# Patient Record
Sex: Male | Born: 1940 | Race: White | Hispanic: No | Marital: Married | Smoking: Former smoker
Health system: Southern US, Community
[De-identification: ages and names within clinical notes are randomized; demographics above are authoritative.]

## PROBLEM LIST (undated history)

## (undated) DIAGNOSIS — J449 Chronic obstructive pulmonary disease, unspecified: Secondary | ICD-10-CM

## (undated) DIAGNOSIS — F419 Anxiety disorder, unspecified: Secondary | ICD-10-CM

## (undated) DIAGNOSIS — F329 Major depressive disorder, single episode, unspecified: Secondary | ICD-10-CM

## (undated) DIAGNOSIS — R0600 Dyspnea, unspecified: Secondary | ICD-10-CM

## (undated) DIAGNOSIS — F32A Depression, unspecified: Secondary | ICD-10-CM

## (undated) DIAGNOSIS — E875 Hyperkalemia: Secondary | ICD-10-CM

## (undated) DIAGNOSIS — Q891 Congenital malformations of adrenal gland: Secondary | ICD-10-CM

## (undated) DIAGNOSIS — K635 Polyp of colon: Secondary | ICD-10-CM

## (undated) DIAGNOSIS — G629 Polyneuropathy, unspecified: Secondary | ICD-10-CM

## (undated) DIAGNOSIS — I2510 Atherosclerotic heart disease of native coronary artery without angina pectoris: Secondary | ICD-10-CM

## (undated) DIAGNOSIS — K31819 Angiodysplasia of stomach and duodenum without bleeding: Secondary | ICD-10-CM

## (undated) DIAGNOSIS — M4850XA Collapsed vertebra, not elsewhere classified, site unspecified, initial encounter for fracture: Secondary | ICD-10-CM

## (undated) DIAGNOSIS — E785 Hyperlipidemia, unspecified: Secondary | ICD-10-CM

## (undated) DIAGNOSIS — K219 Gastro-esophageal reflux disease without esophagitis: Secondary | ICD-10-CM

## (undated) DIAGNOSIS — M51369 Other intervertebral disc degeneration, lumbar region without mention of lumbar back pain or lower extremity pain: Secondary | ICD-10-CM

## (undated) DIAGNOSIS — Z8739 Personal history of other diseases of the musculoskeletal system and connective tissue: Secondary | ICD-10-CM

## (undated) DIAGNOSIS — I10 Essential (primary) hypertension: Secondary | ICD-10-CM

## (undated) DIAGNOSIS — G8929 Other chronic pain: Secondary | ICD-10-CM

## (undated) DIAGNOSIS — K5790 Diverticulosis of intestine, part unspecified, without perforation or abscess without bleeding: Secondary | ICD-10-CM

## (undated) DIAGNOSIS — Z87891 Personal history of nicotine dependence: Secondary | ICD-10-CM

## (undated) DIAGNOSIS — G5600 Carpal tunnel syndrome, unspecified upper limb: Secondary | ICD-10-CM

## (undated) DIAGNOSIS — K589 Irritable bowel syndrome without diarrhea: Secondary | ICD-10-CM

## (undated) DIAGNOSIS — D472 Monoclonal gammopathy: Secondary | ICD-10-CM

## (undated) DIAGNOSIS — E871 Hypo-osmolality and hyponatremia: Secondary | ICD-10-CM

## (undated) DIAGNOSIS — G4730 Sleep apnea, unspecified: Secondary | ICD-10-CM

## (undated) DIAGNOSIS — D649 Anemia, unspecified: Secondary | ICD-10-CM

## (undated) DIAGNOSIS — E7800 Pure hypercholesterolemia, unspecified: Secondary | ICD-10-CM

## (undated) DIAGNOSIS — M5136 Other intervertebral disc degeneration, lumbar region: Secondary | ICD-10-CM

## (undated) DIAGNOSIS — K529 Noninfective gastroenteritis and colitis, unspecified: Secondary | ICD-10-CM

## (undated) HISTORY — DX: Atherosclerotic heart disease of native coronary artery without angina pectoris: I25.10

## (undated) HISTORY — DX: Major depressive disorder, single episode, unspecified: F32.9

## (undated) HISTORY — PX: CARDIAC CATHETERIZATION: SHX172

## (undated) HISTORY — DX: Other intervertebral disc degeneration, lumbar region: M51.36

## (undated) HISTORY — DX: Depression, unspecified: F32.A

## (undated) HISTORY — PX: HEMORRHOID SURGERY: SHX153

## (undated) HISTORY — DX: Polyp of colon: K63.5

## (undated) HISTORY — DX: Gastro-esophageal reflux disease without esophagitis: K21.9

## (undated) HISTORY — DX: Irritable bowel syndrome, unspecified: K58.9

## (undated) HISTORY — DX: Chronic obstructive pulmonary disease, unspecified: J44.9

## (undated) HISTORY — DX: Monoclonal gammopathy: D47.2

## (undated) HISTORY — DX: Personal history of nicotine dependence: Z87.891

## (undated) HISTORY — DX: Carpal tunnel syndrome, unspecified upper limb: G56.00

## (undated) HISTORY — DX: Other intervertebral disc degeneration, lumbar region without mention of lumbar back pain or lower extremity pain: M51.369

## (undated) HISTORY — DX: Congenital malformations of adrenal gland: Q89.1

## (undated) HISTORY — DX: Diverticulosis of intestine, part unspecified, without perforation or abscess without bleeding: K57.90

## (undated) HISTORY — DX: Hyperkalemia: E87.5

## (undated) HISTORY — DX: Essential (primary) hypertension: I10

## (undated) HISTORY — DX: Pure hypercholesterolemia, unspecified: E78.00

---

## 1987-08-20 HISTORY — PX: BUNIONECTOMY: SHX129

## 2004-06-15 ENCOUNTER — Ambulatory Visit: Payer: Self-pay | Admitting: Internal Medicine

## 2004-06-19 ENCOUNTER — Ambulatory Visit: Payer: Self-pay | Admitting: Internal Medicine

## 2004-07-19 ENCOUNTER — Ambulatory Visit: Payer: Self-pay | Admitting: Internal Medicine

## 2004-08-19 ENCOUNTER — Ambulatory Visit: Payer: Self-pay | Admitting: Internal Medicine

## 2004-09-07 ENCOUNTER — Ambulatory Visit: Payer: Self-pay | Admitting: Internal Medicine

## 2004-09-19 ENCOUNTER — Ambulatory Visit: Payer: Self-pay | Admitting: Internal Medicine

## 2004-12-24 ENCOUNTER — Ambulatory Visit: Payer: Self-pay | Admitting: Pain Medicine

## 2005-01-03 ENCOUNTER — Ambulatory Visit: Payer: Self-pay | Admitting: Pain Medicine

## 2005-02-15 ENCOUNTER — Ambulatory Visit: Payer: Self-pay | Admitting: Physician Assistant

## 2005-02-26 ENCOUNTER — Ambulatory Visit: Payer: Self-pay | Admitting: Pain Medicine

## 2005-03-08 ENCOUNTER — Ambulatory Visit: Payer: Self-pay | Admitting: Pain Medicine

## 2005-03-21 ENCOUNTER — Ambulatory Visit: Payer: Self-pay | Admitting: Pain Medicine

## 2005-04-05 ENCOUNTER — Ambulatory Visit: Payer: Self-pay | Admitting: Physician Assistant

## 2005-05-24 ENCOUNTER — Ambulatory Visit: Payer: Self-pay | Admitting: Internal Medicine

## 2005-06-19 ENCOUNTER — Ambulatory Visit: Payer: Self-pay | Admitting: Internal Medicine

## 2005-07-19 ENCOUNTER — Ambulatory Visit: Payer: Self-pay | Admitting: Internal Medicine

## 2005-08-02 ENCOUNTER — Ambulatory Visit: Payer: Self-pay | Admitting: Gastroenterology

## 2005-10-31 ENCOUNTER — Inpatient Hospital Stay: Payer: Self-pay | Admitting: Internal Medicine

## 2005-10-31 ENCOUNTER — Other Ambulatory Visit: Payer: Self-pay

## 2006-04-19 HISTORY — PX: CHOLECYSTECTOMY: SHX55

## 2006-04-23 ENCOUNTER — Ambulatory Visit: Payer: Self-pay | Admitting: Internal Medicine

## 2006-05-07 ENCOUNTER — Other Ambulatory Visit: Payer: Self-pay

## 2006-05-09 ENCOUNTER — Ambulatory Visit: Payer: Self-pay | Admitting: General Surgery

## 2007-05-14 ENCOUNTER — Inpatient Hospital Stay: Payer: Self-pay | Admitting: Specialist

## 2007-06-04 ENCOUNTER — Other Ambulatory Visit: Payer: Self-pay

## 2007-06-04 ENCOUNTER — Ambulatory Visit: Payer: Self-pay | Admitting: Surgery

## 2007-06-09 ENCOUNTER — Ambulatory Visit: Payer: Self-pay | Admitting: Surgery

## 2007-12-02 ENCOUNTER — Ambulatory Visit: Payer: Self-pay | Admitting: Unknown Physician Specialty

## 2007-12-24 ENCOUNTER — Ambulatory Visit: Payer: Self-pay | Admitting: Unknown Physician Specialty

## 2008-02-17 ENCOUNTER — Ambulatory Visit: Payer: Self-pay | Admitting: Internal Medicine

## 2008-04-07 ENCOUNTER — Ambulatory Visit: Payer: Self-pay | Admitting: Family

## 2008-08-19 HISTORY — PX: EYE SURGERY: SHX253

## 2009-04-26 ENCOUNTER — Ambulatory Visit: Payer: Self-pay | Admitting: Ophthalmology

## 2009-05-09 ENCOUNTER — Ambulatory Visit: Payer: Self-pay | Admitting: Ophthalmology

## 2009-06-21 ENCOUNTER — Ambulatory Visit: Payer: Self-pay | Admitting: Ophthalmology

## 2009-06-30 ENCOUNTER — Ambulatory Visit: Payer: Self-pay | Admitting: Ophthalmology

## 2009-08-19 HISTORY — PX: CARPAL TUNNEL RELEASE: SHX101

## 2009-11-22 ENCOUNTER — Ambulatory Visit: Payer: Self-pay | Admitting: Gastroenterology

## 2010-03-06 ENCOUNTER — Ambulatory Visit: Payer: Self-pay | Admitting: Orthopedic Surgery

## 2010-03-27 ENCOUNTER — Ambulatory Visit: Payer: Self-pay | Admitting: Orthopedic Surgery

## 2010-05-04 ENCOUNTER — Other Ambulatory Visit: Payer: Self-pay | Admitting: Internal Medicine

## 2011-02-06 ENCOUNTER — Ambulatory Visit: Payer: Self-pay | Admitting: Internal Medicine

## 2011-04-09 ENCOUNTER — Ambulatory Visit: Payer: Self-pay | Admitting: Cardiology

## 2011-08-05 ENCOUNTER — Ambulatory Visit: Payer: Self-pay | Admitting: Unknown Physician Specialty

## 2011-08-07 LAB — PATHOLOGY REPORT

## 2011-10-16 ENCOUNTER — Ambulatory Visit: Payer: Self-pay | Admitting: Physician Assistant

## 2011-11-06 ENCOUNTER — Ambulatory Visit: Payer: Self-pay | Admitting: Gastroenterology

## 2011-12-30 LAB — HEPATIC FUNCTION PANEL
ALT: 10 U/L (ref 10–40)
Alkaline Phosphatase: 52 U/L (ref 25–125)
Bilirubin, Total: 0.5 mg/dL

## 2011-12-30 LAB — LIPID PANEL
Cholesterol: 163 mg/dL (ref 0–200)
HDL: 60 mg/dL (ref 35–70)

## 2011-12-30 LAB — TSH: TSH: 1.78 u[IU]/mL (ref <=5.90)

## 2011-12-30 LAB — CBC AND DIFFERENTIAL: Platelets: 412 10*3/uL — AB (ref 150–399)

## 2012-01-30 LAB — BASIC METABOLIC PANEL
Glucose: 93 mg/dL
Sodium: 128 mmol/L — AB (ref 137–147)

## 2012-06-29 ENCOUNTER — Telehealth: Payer: Self-pay | Admitting: Internal Medicine

## 2012-06-29 NOTE — Telephone Encounter (Signed)
If he is unable to hear - I rec he go ahead and be evaluated today to confirm nothing more acute going on and then i can follow up with him afterwards.

## 2012-06-29 NOTE — Telephone Encounter (Signed)
Pt called wanting to be seen asap  His ear stopped and he cannot hear Cell # [phone]

## 2012-06-29 NOTE — Telephone Encounter (Signed)
Called patient to let him know and he was upset but he agreed to go to acute care. Patient stated that he would follow up afterward.

## 2012-07-19 ENCOUNTER — Telehealth: Payer: Self-pay | Admitting: Internal Medicine

## 2012-07-19 MED ORDER — GABAPENTIN 100 MG PO CAPS: ORAL_CAPSULE | ORAL | Status: DC

## 2012-07-19 MED ORDER — LOSARTAN POTASSIUM 100 MG PO TABS: 100.0000 mg | ORAL_TABLET | Freq: Every day | ORAL | Status: DC

## 2012-07-19 NOTE — Telephone Encounter (Signed)
rx sent to [facility] for gabapentin (#210) with 2 refills and losartan #30 with 2 refills.

## 2012-09-08 ENCOUNTER — Encounter: Payer: Self-pay | Admitting: *Deleted

## 2012-09-08 ENCOUNTER — Ambulatory Visit (INDEPENDENT_AMBULATORY_CARE_PROVIDER_SITE_OTHER): Payer: Medicare Other | Admitting: Internal Medicine

## 2012-09-08 ENCOUNTER — Encounter: Payer: Self-pay | Admitting: Internal Medicine

## 2012-09-08 VITALS — BP 140/84 | HR 78 | Temp 97.7°F | Ht 71.0 in | Wt 198.5 lb

## 2012-09-08 DIAGNOSIS — D649 Anemia, unspecified: Secondary | ICD-10-CM

## 2012-09-08 DIAGNOSIS — D472 Monoclonal gammopathy: Secondary | ICD-10-CM

## 2012-09-08 DIAGNOSIS — E871 Hypo-osmolality and hyponatremia: Secondary | ICD-10-CM | POA: Insufficient documentation

## 2012-09-08 DIAGNOSIS — G4730 Sleep apnea, unspecified: Secondary | ICD-10-CM

## 2012-09-08 DIAGNOSIS — R197 Diarrhea, unspecified: Secondary | ICD-10-CM

## 2012-09-08 DIAGNOSIS — I10 Essential (primary) hypertension: Secondary | ICD-10-CM

## 2012-09-08 DIAGNOSIS — Z23 Encounter for immunization: Secondary | ICD-10-CM

## 2012-09-08 DIAGNOSIS — K219 Gastro-esophageal reflux disease without esophagitis: Secondary | ICD-10-CM

## 2012-09-08 DIAGNOSIS — A09 Infectious gastroenteritis and colitis, unspecified: Secondary | ICD-10-CM

## 2012-09-08 DIAGNOSIS — I2510 Atherosclerotic heart disease of native coronary artery without angina pectoris: Secondary | ICD-10-CM | POA: Insufficient documentation

## 2012-09-08 DIAGNOSIS — E7800 Pure hypercholesterolemia, unspecified: Secondary | ICD-10-CM

## 2012-09-08 DIAGNOSIS — E875 Hyperkalemia: Secondary | ICD-10-CM

## 2012-09-08 DIAGNOSIS — D509 Iron deficiency anemia, unspecified: Secondary | ICD-10-CM | POA: Insufficient documentation

## 2012-09-09 MED ORDER — PNEUMOCOCCAL VAC POLYVALENT 25 MCG/0.5ML IJ INJ
0.5000 mL | INJECTION | Freq: Once | INTRAMUSCULAR | Status: AC
  Administered 2012-09-09: 0.5 mL via INTRAMUSCULAR

## 2012-09-13 ENCOUNTER — Encounter: Payer: Self-pay | Admitting: Internal Medicine

## 2012-09-13 DIAGNOSIS — E875 Hyperkalemia: Secondary | ICD-10-CM | POA: Insufficient documentation

## 2012-09-13 DIAGNOSIS — G4730 Sleep apnea, unspecified: Secondary | ICD-10-CM | POA: Insufficient documentation

## 2012-09-13 DIAGNOSIS — K219 Gastro-esophageal reflux disease without esophagitis: Secondary | ICD-10-CM | POA: Insufficient documentation

## 2012-09-13 NOTE — Assessment & Plan Note (Signed)
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  Continue aggressive risk factor modification.  Continues to follow up with Dr Darrold Junker.

## 2012-09-13 NOTE — Assessment & Plan Note (Addendum)
Persistent problem.  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  Follow sodium levels.  Was found to have adrenal gland enlargement on CT - unchanged from 2007.  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  Follow.  Blood pressure is under good control.

## 2012-09-13 NOTE — Progress Notes (Signed)
Subjective:    Patient ID: Troy Lopez, male    DOB: 1941/01/28, 72 y.o.   MRN: [id number]  HPI 72 year old male with past history of hypertension, hypercholesterolemia, IBS and monoclonal gammopathy.  He comes in today for a scheduled follow up.  States he was previously having problems with increased diarrhea.  Was going 10-12 times by lunch/early pm.  Saw Dr Bluford Kaufmann.  Was started on Colestid.  Did well for a while.  Over the last 2-3 weeks, starting to flare again.  More solid now, but still going frequently.  Has been four times already this am.  States when he wakes up, his stomach hurts.  Increased gas.  He is eating.  No vomiting.  Some nausea.  Weight is stable from his last check.  No chest pain or tightness.  Some decreased energy.  No blood.    Past Medical History  Diagnosis Date  . Hypertension   . Hypercholesterolemia   . Irritable bowel syndrome   . GERD (gastroesophageal reflux disease)   . Colonic polyp   . Monoclonal gammopathy   . Hyperkalemia   . COPD (chronic obstructive pulmonary disease)   . Adrenal gland anomaly     enlargement  . Diverticulosis   . Depression   . Degenerative disc disease, lumbar   . Carpal tunnel syndrome     Current Outpatient Prescriptions on File Prior to Visit  Medication Sig Dispense Refill  . amLODipine (NORVASC) 10 MG tablet Take 10 mg by mouth daily.      . cloNIDine (CATAPRES) 0.1 MG tablet Take 0.1 mg by mouth 2 (two) times daily.      . colestipol (COLESTID) 1 G tablet Take 1 g by mouth 2 (two) times daily.      . fluticasone (FLONASE) 50 MCG/ACT nasal spray Place 2 sprays into the nose daily.      . furosemide (LASIX) 20 MG tablet Take 20 mg by mouth daily.      Marland Kitchen gabapentin (NEURONTIN) 100 MG capsule Take two each morning, two each afternoon and three at bedtime  210 capsule  2  . imipramine (TOFRANIL) 25 MG tablet Take 25 mg by mouth at bedtime.      Marland Kitchen losartan (COZAAR) 100 MG tablet Take 1 tablet (100 mg total) by mouth daily.   30 tablet  2  . metoprolol (LOPRESSOR) 100 MG tablet Take 100 mg by mouth 2 (two) times daily.      Marland Kitchen omeprazole (PRILOSEC) 20 MG capsule Take 1 tab (2) times daily        Review of Systems Patient denies any headache, lightheadedness or dizziness. No sinus or allergy symptoms.   No chest pain, tightness or palpoitations.  No increased shortness of breath, cough or congestion.  No vomiting.  Some nausea.  No constipation, BRBPR or melana.  He is having the frequent stools as outlined.  No urine change.   Increased fatigue.      Objective:   Physical Exam Filed Vitals:   09/08/12 1101  BP: 140/84  Pulse: 78  Temp: 97.7 F (56.70 C)   72 year old male in no acute distress.  HEENT:  Nares - clear.  Oropharynx - without lesions. NECK:  Supple.  Nontender.  No audible carotid bruit.  HEART:  Appears to be regular.   LUNGS:  No crackles or wheezing audible.  Respirations even and unlabored.   RADIAL PULSE:  Equal bilaterally.  ABDOMEN:  Soft.  Nontender.  Bowel sounds  present and normal.  No audible abdominal bruit.   EXTREMITIES:  No increased edema present.  DP pulses palpable and equal bilaterally.          Assessment & Plan:  GI.  Has known IBS and diverticulosis.  He is now having flares again with frequent stools.  See above. Has had an extensive w/up previously.  Will refer back to GI for evaluation and further treatment.  On colestid.    PULMONARY.  Has quit smoking.  Breathing stable.  Follow.    MSK.  Seeing Dr Channing Mutters.  Stable.  Follow.    INCREASED PSYCHOSOCIAL STRESSORS/DEPRESSION.  Was on Zoloft.  Does not appear to be taking now.  Stable.  Follow.    HEALTH MAINTENANCE.  Physical 01/01/12.  Has had extensive GI w/up.  PSA 01/29/12 - 1.48.

## 2012-09-13 NOTE — Assessment & Plan Note (Signed)
Blood pressure is doing well.  Follow.  Recheck today - 132/80.  Check metabolic panel.

## 2012-09-13 NOTE — Assessment & Plan Note (Signed)
EGD 08/05/11 with hiatal hernia, gastritis and an acquired duodenal stenosis.  Symptoms controlled.  On prilosec.  Follow.

## 2012-09-13 NOTE — Assessment & Plan Note (Signed)
Recheck potassium today.  Follow.

## 2012-09-13 NOTE — Assessment & Plan Note (Signed)
M spike has been stable.  Recheck SIEP.  Follow.

## 2012-09-13 NOTE — Assessment & Plan Note (Signed)
Has had extensive GI w/up.  M spike has been stable.  Recheck cbc.

## 2012-09-13 NOTE — Assessment & Plan Note (Signed)
Confirm with sleep study.   Try to tolerate CPAP.

## 2012-09-17 ENCOUNTER — Other Ambulatory Visit: Payer: Medicare Other

## 2012-09-18 ENCOUNTER — Other Ambulatory Visit (INDEPENDENT_AMBULATORY_CARE_PROVIDER_SITE_OTHER): Payer: Medicare Other

## 2012-09-18 DIAGNOSIS — I10 Essential (primary) hypertension: Secondary | ICD-10-CM

## 2012-09-18 DIAGNOSIS — E7800 Pure hypercholesterolemia, unspecified: Secondary | ICD-10-CM

## 2012-09-18 DIAGNOSIS — D472 Monoclonal gammopathy: Secondary | ICD-10-CM

## 2012-09-18 DIAGNOSIS — D649 Anemia, unspecified: Secondary | ICD-10-CM

## 2012-09-18 LAB — BASIC METABOLIC PANEL
CO2: 26 mEq/L (ref 19–32)
Glucose, Bld: 81 mg/dL (ref 70–99)
Potassium: 5 mEq/L (ref 3.5–5.1)
Sodium: 129 mEq/L — ABNORMAL LOW (ref 135–145)

## 2012-09-18 LAB — HEPATIC FUNCTION PANEL
ALT: 21 U/L (ref 0–53)
AST: 25 U/L (ref 0–37)
Albumin: 3.5 g/dL (ref 3.5–5.2)
Alkaline Phosphatase: 60 U/L (ref 39–117)
Total Protein: 8.1 g/dL (ref 6.0–8.3)

## 2012-09-18 LAB — CBC WITH DIFFERENTIAL/PLATELET
Basophils Relative: 0.7 % (ref 0.0–3.0)
Eosinophils Relative: 3.4 % (ref 0.0–5.0)
HCT: 39.5 % (ref 39.0–52.0)
Lymphs Abs: 1.8 10*3/uL (ref 0.7–4.0)
MCV: 97.2 fl (ref 78.0–100.0)
Monocytes Absolute: 0.6 10*3/uL (ref 0.1–1.0)
Platelets: 417 10*3/uL — ABNORMAL HIGH (ref 150.0–400.0)
WBC: 6.5 10*3/uL (ref 4.5–10.5)

## 2012-09-18 LAB — FERRITIN: Ferritin: 10.6 ng/mL — ABNORMAL LOW (ref 22.0–322.0)

## 2012-09-19 ENCOUNTER — Telehealth: Payer: Self-pay | Admitting: Internal Medicine

## 2012-09-19 NOTE — Telephone Encounter (Signed)
Pt notified of labs via my chart.  

## 2012-09-22 LAB — PROTEIN ELECTROPHORESIS, SERUM, WITH REFLEX
Albumin ELP: 49.3 % — ABNORMAL LOW (ref 55.8–66.1)
Alpha-1-Globulin: 3.5 % (ref 2.9–4.9)
Alpha-2-Globulin: 7.1 % (ref 7.1–11.8)
M-Spike, %: 0.8 g/dL
Total Protein, Serum Electrophoresis: 8.2 g/dL (ref 6.0–8.3)

## 2012-09-22 LAB — IGG, IGA, IGM
IgA: 58 mg/dL — ABNORMAL LOW (ref 68–379)
IgG (Immunoglobin G), Serum: 661 mg/dL (ref 650–1600)

## 2012-09-22 LAB — IFE INTERPRETATION

## 2012-10-03 ENCOUNTER — Other Ambulatory Visit: Payer: Self-pay

## 2012-10-08 ENCOUNTER — Other Ambulatory Visit: Payer: Self-pay | Admitting: *Deleted

## 2012-10-09 MED ORDER — CLONIDINE HCL 0.1 MG PO TABS: 0.1000 mg | ORAL_TABLET | Freq: Two times a day (BID) | ORAL | Status: DC

## 2012-10-09 MED ORDER — METOPROLOL TARTRATE 100 MG PO TABS: 100.0000 mg | ORAL_TABLET | Freq: Two times a day (BID) | ORAL | Status: DC

## 2012-11-17 ENCOUNTER — Other Ambulatory Visit: Payer: Self-pay | Admitting: *Deleted

## 2012-11-18 MED ORDER — LOSARTAN POTASSIUM 100 MG PO TABS: 100.0000 mg | ORAL_TABLET | Freq: Every day | ORAL | Status: DC

## 2012-11-18 NOTE — Telephone Encounter (Signed)
Rx sent in to pharmacy. 

## 2012-12-23 ENCOUNTER — Other Ambulatory Visit: Payer: Self-pay | Admitting: *Deleted

## 2012-12-23 MED ORDER — AMLODIPINE BESYLATE 10 MG PO TABS: 10.0000 mg | ORAL_TABLET | Freq: Every day | ORAL | Status: DC

## 2013-01-01 ENCOUNTER — Ambulatory Visit (INDEPENDENT_AMBULATORY_CARE_PROVIDER_SITE_OTHER): Payer: Medicare Other | Admitting: Internal Medicine

## 2013-01-01 ENCOUNTER — Encounter: Payer: Self-pay | Admitting: Internal Medicine

## 2013-01-01 ENCOUNTER — Other Ambulatory Visit: Payer: Self-pay | Admitting: Internal Medicine

## 2013-01-01 VITALS — BP 110/80 | HR 62 | Temp 98.7°F | Ht 71.0 in | Wt 198.0 lb

## 2013-01-01 DIAGNOSIS — G4730 Sleep apnea, unspecified: Secondary | ICD-10-CM

## 2013-01-01 DIAGNOSIS — I10 Essential (primary) hypertension: Secondary | ICD-10-CM

## 2013-01-01 DIAGNOSIS — D649 Anemia, unspecified: Secondary | ICD-10-CM

## 2013-01-01 DIAGNOSIS — K219 Gastro-esophageal reflux disease without esophagitis: Secondary | ICD-10-CM

## 2013-01-01 DIAGNOSIS — E7800 Pure hypercholesterolemia, unspecified: Secondary | ICD-10-CM

## 2013-01-01 DIAGNOSIS — E871 Hypo-osmolality and hyponatremia: Secondary | ICD-10-CM

## 2013-01-01 DIAGNOSIS — I2510 Atherosclerotic heart disease of native coronary artery without angina pectoris: Secondary | ICD-10-CM

## 2013-01-01 DIAGNOSIS — R0602 Shortness of breath: Secondary | ICD-10-CM

## 2013-01-01 DIAGNOSIS — D472 Monoclonal gammopathy: Secondary | ICD-10-CM

## 2013-01-01 DIAGNOSIS — E875 Hyperkalemia: Secondary | ICD-10-CM

## 2013-01-01 MED ORDER — FUROSEMIDE 20 MG PO TABS: 20.0000 mg | ORAL_TABLET | Freq: Every day | ORAL | Status: DC

## 2013-01-01 NOTE — Telephone Encounter (Signed)
Will refill at appt today

## 2013-01-03 ENCOUNTER — Encounter: Payer: Self-pay | Admitting: Internal Medicine

## 2013-01-03 ENCOUNTER — Other Ambulatory Visit: Payer: Self-pay | Admitting: Internal Medicine

## 2013-01-03 DIAGNOSIS — I2510 Atherosclerotic heart disease of native coronary artery without angina pectoris: Secondary | ICD-10-CM

## 2013-01-03 DIAGNOSIS — R0602 Shortness of breath: Secondary | ICD-10-CM

## 2013-01-03 NOTE — Assessment & Plan Note (Signed)
Confirmed with sleep study.   Try to tolerate CPAP.   

## 2013-01-03 NOTE — Assessment & Plan Note (Signed)
EGD 08/05/11 with hiatal hernia, gastritis and an acquired duodenal stenosis.  Symptoms controlled.  On prilosec.  Follow.

## 2013-01-03 NOTE — Assessment & Plan Note (Signed)
M spike has been stable.  Recheck SIEP.  Follow.

## 2013-01-03 NOTE — Assessment & Plan Note (Signed)
Has had extensive GI w/up.  M spike has been stable.  Recheck cbc.

## 2013-01-03 NOTE — Progress Notes (Signed)
Subjective:    Patient ID: Troy Lopez, male    DOB: 08/27/1940, 71 y.o.   MRN: [id number]  HPI 72 year old male with past history of hypertension, hypercholesterolemia, IBS and monoclonal gammopathy.  He comes in today to follow up on these issues as well as for a complete physical exam.  States he was previously having problems with increased diarrhea.  Was going 10-12 times by lunch/early pm.  Saw Dr Bluford Kaufmann.  Was started on Colestid. Did well for a while and then symptoms flared again.  See lab note for details.  Is followed by GI.  Recently evaluated by [facility].  Stopped his [employer].  She increased his imiipramine.  Symptoms improved off the medication and on higher dose if imipramine.  Certain foods do aggravate (ie, yellow corn).  Overall he feels his bowels are doing better.  Blood pressure has been doing well.  He has noticed if her hurries, he will get out of breath.  If he walks up a hill, gets more sob.  Has known heart disease.  Due to see Dr Darrold Junker next month.  Did stop smoking.  Has quit now for a month.  Does report increased fatigue, noticed more with exertion.  He does report some issues with depression.  Dawn increased his imipramine.  He does feel some better.  We discussed the need to decrease/stop his increased alcohol intake.     Past Medical History  Diagnosis Date  . Hypertension   . Hypercholesterolemia   . Irritable bowel syndrome   . GERD (gastroesophageal reflux disease)   . Colonic polyp   . Monoclonal gammopathy   . Hyperkalemia   . COPD (chronic obstructive pulmonary disease)   . Adrenal gland anomaly     enlargement  . Diverticulosis   . Depression   . Degenerative disc disease, lumbar   . Carpal tunnel syndrome     Current Outpatient Prescriptions on File Prior to Visit  Medication Sig Dispense Refill  . amLODipine (NORVASC) 10 MG tablet Take 1 tablet (10 mg total) by mouth daily.  30 tablet  5  . cloNIDine (CATAPRES) 0.1 MG tablet Take 1  tablet (0.1 mg total) by mouth 2 (two) times daily.  60 tablet  5  . fluticasone (FLONASE) 50 MCG/ACT nasal spray Place 2 sprays into the nose daily.      Marland Kitchen gabapentin (NEURONTIN) 100 MG capsule Take two each morning, two each afternoon and three at bedtime  210 capsule  2  . ibuprofen (ADVIL,MOTRIN) 200 MG tablet Take 200 mg by mouth as needed. Take 2-4 tabs (4) times daily      . imipramine (TOFRANIL) 25 MG tablet Take 50 mg by mouth at bedtime.       Marland Kitchen losartan (COZAAR) 100 MG tablet Take 1 tablet (100 mg total) by mouth daily.  30 tablet  5  . metoprolol (LOPRESSOR) 100 MG tablet Take 1 tablet (100 mg total) by mouth 2 (two) times daily.  60 tablet  5  . omeprazole (PRILOSEC) 20 MG capsule Take 1 tab (2) times daily      . Psyllium-Calcium (METAMUCIL PLUS CALCIUM PO) Take by mouth daily. 2 cap daily      . colestipol (COLESTID) 1 G tablet Take 1 g by mouth 2 (two) times daily.      Marland Kitchen HYDROcodone-acetaminophen (NORCO/VICODIN) 5-325 MG per tablet Take 1 tablet by mouth every 4 (four) hours as needed.        No  current facility-administered medications on file prior to visit.    Review of Systems Patient denies any headache, lightheadedness or dizziness. No sinus or allergy symptoms.   No chest pain, tightness or palpitations.  Does report the increased sob with walking up a hill.  If her hurries, he gets out of breath.  More fatigued.  No increased cough or congestion currently.   No vomiting. No nausea now.  Bowels better.   No urine change.   Increased fatigue.      Objective:   Physical Exam  Filed Vitals:   01/01/13 1047  BP: 110/80  Pulse: 62  Temp: 98.7 F (37.1 C)   Blood pressure recheck:  132/80, pulse 43  72 year old male in no acute distress.  HEENT:  Nares - clear.  Oropharynx - without lesions. NECK:  Supple.  Nontender.  No audible carotid bruit.  HEART:  Appears to be regular.   LUNGS:  No crackles or wheezing audible.  Respirations even and unlabored.   RADIAL  PULSE:  Equal bilaterally.  ABDOMEN:  Soft.  Nontender.  Bowel sounds present and normal.  No audible abdominal bruit.  GU:  Normal descended testicles.  No palpable testicular nodules.   RECTAL:  Could not appreciate any palpable prostate nodules.  Heme negative.   EXTREMITIES:  No increased edema present.  DP pulses palpable and equal bilaterally.          Assessment & Plan:  GI.  Has known IBS and diverticulosis.  Bowels better.    PULMONARY.  Has quit smoking.  Follow.  Does report getting out of breath with inclines.  If he hurries, he is out of breath.  Will pursue cardiac w/up first.    MSK.  Seeing Dr Channing Mutters.  Stable.  Follow.    INCREASED PSYCHOSOCIAL STRESSORS/DEPRESSION.  Was on Zoloft.  Is not taking now.  On increased imipramine now.  Dawn just recently increased the dose.  Feels some better.   Stable.  Follow.  Discussed the need to cut back or stop the alcohol.   HEALTH MAINTENANCE. Physical today.  Has had extensive GI w/up.  PSA 01/29/12 - 1.48.  Follow.

## 2013-01-03 NOTE — Assessment & Plan Note (Signed)
Persistent problem.  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  Follow sodium levels.  Was found to have adrenal gland enlargement on CT - unchanged from 2007.  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  Follow.  Blood pressure is under good control.  Discussed the need to cut down on his alcohol intake and eat regular meals.  Could be contributing.

## 2013-01-03 NOTE — Assessment & Plan Note (Signed)
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  Continue aggressive risk factor modification.  Continues to follow up with Dr Darrold Junker.  Given the change in symptoms as outlined, EKG obtained and revealed SR with no acute ischemic changes.  He is due to see Dr Darrold Junker next month.  Will get an earlier appt for evaluation and further testing - regarding the sob with exertion, inclines and increased fatigue.

## 2013-01-03 NOTE — Progress Notes (Signed)
Order placed for cardiology referral.   

## 2013-01-03 NOTE — Assessment & Plan Note (Signed)
Blood pressure is doing well.  Follow.  Recheck today - 132/80.  Follow metabolic panel.

## 2013-01-03 NOTE — Assessment & Plan Note (Signed)
Last check stable.  Follow.   

## 2013-01-04 ENCOUNTER — Other Ambulatory Visit: Payer: Self-pay | Admitting: *Deleted

## 2013-01-04 MED ORDER — FLUTICASONE PROPIONATE 50 MCG/ACT NA SUSP: 2.0000 | Freq: Every day | NASAL | Status: DC

## 2013-01-05 ENCOUNTER — Other Ambulatory Visit (INDEPENDENT_AMBULATORY_CARE_PROVIDER_SITE_OTHER): Payer: Medicare Other

## 2013-01-05 DIAGNOSIS — E7800 Pure hypercholesterolemia, unspecified: Secondary | ICD-10-CM

## 2013-01-05 DIAGNOSIS — D472 Monoclonal gammopathy: Secondary | ICD-10-CM

## 2013-01-05 DIAGNOSIS — E871 Hypo-osmolality and hyponatremia: Secondary | ICD-10-CM

## 2013-01-05 DIAGNOSIS — I10 Essential (primary) hypertension: Secondary | ICD-10-CM

## 2013-01-05 LAB — TSH: TSH: 0.93 u[IU]/mL (ref 0.35–5.50)

## 2013-01-05 LAB — BASIC METABOLIC PANEL
CO2: 28 mEq/L (ref 19–32)
Chloride: 92 mEq/L — ABNORMAL LOW (ref 96–112)
Creatinine, Ser: 0.9 mg/dL (ref 0.4–1.5)
Potassium: 5 mEq/L (ref 3.5–5.1)

## 2013-01-05 LAB — CBC WITH DIFFERENTIAL/PLATELET
Basophils Absolute: 0.1 10*3/uL (ref 0.0–0.1)
Basophils Relative: 1.3 % (ref 0.0–3.0)
Hemoglobin: 12.6 g/dL — ABNORMAL LOW (ref 13.0–17.0)
Lymphocytes Relative: 24.7 % (ref 12.0–46.0)
Monocytes Relative: 10.4 % (ref 3.0–12.0)
Neutro Abs: 4.4 10*3/uL (ref 1.4–7.7)
RBC: 3.78 Mil/uL — ABNORMAL LOW (ref 4.22–5.81)
RDW: 14.6 % (ref 11.5–14.6)

## 2013-01-05 LAB — LIPID PANEL
LDL Cholesterol: 103 mg/dL — ABNORMAL HIGH (ref 0–99)
Total CHOL/HDL Ratio: 3
Triglycerides: 38 mg/dL (ref 0.0–149.0)
VLDL: 7.6 mg/dL (ref 0.0–40.0)

## 2013-01-05 LAB — HEPATIC FUNCTION PANEL
Albumin: 3.2 g/dL — ABNORMAL LOW (ref 3.5–5.2)
Alkaline Phosphatase: 55 U/L (ref 39–117)

## 2013-01-07 LAB — PROTEIN ELECTROPHORESIS, SERUM
Albumin ELP: 47.8 % — ABNORMAL LOW (ref 55.8–66.1)
Alpha-1-Globulin: 3.6 % (ref 2.9–4.9)
Beta 2: 2.1 % — ABNORMAL LOW (ref 3.2–6.5)
Total Protein, Serum Electrophoresis: 7.7 g/dL (ref 6.0–8.3)

## 2013-01-11 ENCOUNTER — Telehealth: Payer: Self-pay | Admitting: Internal Medicine

## 2013-01-11 ENCOUNTER — Encounter: Payer: Self-pay | Admitting: Internal Medicine

## 2013-01-11 ENCOUNTER — Other Ambulatory Visit: Payer: Self-pay | Admitting: Internal Medicine

## 2013-01-11 DIAGNOSIS — E871 Hypo-osmolality and hyponatremia: Secondary | ICD-10-CM

## 2013-01-11 NOTE — Progress Notes (Signed)
Order placed for f/u sodium check.   

## 2013-01-11 NOTE — Telephone Encounter (Signed)
Pt notified of lab results via my chart.  Needs a f/u sodium check within one week.   Please schedule a non fasting lab appointment within one week.  Please call pt with appt date and time.  Thanks.

## 2013-01-13 NOTE — Telephone Encounter (Signed)
Appointment 6/3 pt aware

## 2013-01-19 ENCOUNTER — Other Ambulatory Visit (INDEPENDENT_AMBULATORY_CARE_PROVIDER_SITE_OTHER): Payer: Medicare Other

## 2013-01-19 DIAGNOSIS — E871 Hypo-osmolality and hyponatremia: Secondary | ICD-10-CM

## 2013-01-19 LAB — SODIUM: Sodium: 128 mEq/L — ABNORMAL LOW (ref 135–145)

## 2013-01-20 ENCOUNTER — Encounter: Payer: Self-pay | Admitting: Internal Medicine

## 2013-01-22 ENCOUNTER — Other Ambulatory Visit: Payer: Self-pay | Admitting: Family

## 2013-01-22 ENCOUNTER — Observation Stay: Payer: Self-pay | Admitting: Internal Medicine

## 2013-01-22 LAB — APTT: Activated PTT: 35.2 secs (ref 23.6–35.9)

## 2013-01-23 LAB — BASIC METABOLIC PANEL
Anion Gap: 7 (ref 7–16)
BUN: 13 mg/dL (ref 7–18)
Chloride: 100 mmol/L (ref 98–107)
Co2: 23 mmol/L (ref 21–32)
Creatinine: 0.88 mg/dL (ref 0.60–1.30)
Glucose: 91 mg/dL (ref 65–99)
Potassium: 4.4 mmol/L (ref 3.5–5.1)

## 2013-01-23 LAB — CBC WITH DIFFERENTIAL/PLATELET
Basophil #: 0.1 10*3/uL (ref 0.0–0.1)
Basophil %: 0.9 %
Eosinophil #: 0.2 10*3/uL (ref 0.0–0.7)
Eosinophil %: 2.1 %
HCT: 28.7 % — ABNORMAL LOW (ref 40.0–52.0)
HGB: 9.9 g/dL — ABNORMAL LOW (ref 13.0–18.0)
Lymphocyte %: 25.8 %
MCH: 33.4 pg (ref 26.0–34.0)
MCHC: 34.6 g/dL (ref 32.0–36.0)
MCV: 97 fL (ref 80–100)
Monocyte #: 0.8 x10 3/mm (ref 0.2–1.0)
Neutrophil #: 5.2 10*3/uL (ref 1.4–6.5)
Neutrophil %: 61.2 %
Platelet: 346 10*3/uL (ref 150–440)
RBC: 2.97 10*6/uL — ABNORMAL LOW (ref 4.40–5.90)
RDW: 14.7 % — ABNORMAL HIGH (ref 11.5–14.5)
WBC: 8.5 10*3/uL (ref 3.8–10.6)

## 2013-01-24 LAB — CBC WITH DIFFERENTIAL/PLATELET
Basophil #: 0.1 10*3/uL (ref 0.0–0.1)
Basophil %: 0.8 %
Eosinophil #: 0.1 10*3/uL (ref 0.0–0.7)
Eosinophil %: 1.4 %
HCT: 28.8 % — ABNORMAL LOW (ref 40.0–52.0)
HGB: 10.1 g/dL — ABNORMAL LOW (ref 13.0–18.0)
Lymphocyte #: 2.1 10*3/uL (ref 1.0–3.6)
Lymphocyte %: 28.1 %
MCH: 33.5 pg (ref 26.0–34.0)
MCHC: 35.1 g/dL (ref 32.0–36.0)
Neutrophil #: 4.5 10*3/uL (ref 1.4–6.5)
Platelet: 360 10*3/uL (ref 150–440)
RBC: 3.02 10*6/uL — ABNORMAL LOW (ref 4.40–5.90)

## 2013-02-02 ENCOUNTER — Encounter: Payer: Self-pay | Admitting: Internal Medicine

## 2013-02-02 ENCOUNTER — Ambulatory Visit (INDEPENDENT_AMBULATORY_CARE_PROVIDER_SITE_OTHER): Payer: Medicare Other | Admitting: Internal Medicine

## 2013-02-02 VITALS — BP 110/80 | HR 66 | Temp 97.8°F | Ht 71.0 in | Wt 193.8 lb

## 2013-02-02 DIAGNOSIS — E875 Hyperkalemia: Secondary | ICD-10-CM

## 2013-02-02 DIAGNOSIS — D649 Anemia, unspecified: Secondary | ICD-10-CM

## 2013-02-02 DIAGNOSIS — D472 Monoclonal gammopathy: Secondary | ICD-10-CM

## 2013-02-02 DIAGNOSIS — G4730 Sleep apnea, unspecified: Secondary | ICD-10-CM

## 2013-02-02 DIAGNOSIS — G8929 Other chronic pain: Secondary | ICD-10-CM

## 2013-02-02 DIAGNOSIS — I10 Essential (primary) hypertension: Secondary | ICD-10-CM

## 2013-02-02 DIAGNOSIS — K219 Gastro-esophageal reflux disease without esophagitis: Secondary | ICD-10-CM

## 2013-02-02 DIAGNOSIS — L98499 Non-pressure chronic ulcer of skin of other sites with unspecified severity: Secondary | ICD-10-CM

## 2013-02-02 DIAGNOSIS — M549 Dorsalgia, unspecified: Secondary | ICD-10-CM | POA: Insufficient documentation

## 2013-02-02 DIAGNOSIS — E871 Hypo-osmolality and hyponatremia: Secondary | ICD-10-CM

## 2013-02-02 DIAGNOSIS — I2510 Atherosclerotic heart disease of native coronary artery without angina pectoris: Secondary | ICD-10-CM

## 2013-02-02 DIAGNOSIS — IMO0002 Reserved for concepts with insufficient information to code with codable children: Secondary | ICD-10-CM

## 2013-02-02 MED ORDER — TRAMADOL HCL 50 MG PO TABS: 50.0000 mg | ORAL_TABLET | Freq: Four times a day (QID) | ORAL | Status: DC | PRN

## 2013-02-02 NOTE — Assessment & Plan Note (Signed)
Pain worsened now.  Off ibuprofen.  Take tramadol qid and tylenol.  Has had MRI.  Has seen neurosurgery and Dr Yves Dill.  Is s/p injections.  Follow. Discussed referral back to surgery or pain control.

## 2013-02-02 NOTE — Assessment & Plan Note (Signed)
M spike has been relatively stable.  With the anemia (even prior to the bleed), back pain and persistent elevation, would like for hematology to reevaluate.  Will refer back to hematology.  Follow.

## 2013-02-02 NOTE — Assessment & Plan Note (Signed)
Has had extensive GI w/up previously.  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  Recently admitted with GI bleed.  On iron.  Per pt, hgb just checked 01/29/13 through GI and was still low but stable.  Will need to follow.  Will refer back to hematology for evaluation.

## 2013-02-02 NOTE — Assessment & Plan Note (Signed)
Confirmed with sleep study.   Try to tolerate CPAP.   

## 2013-02-02 NOTE — Assessment & Plan Note (Addendum)
EGD as outlined.  On nexium.  Continues to follow up with GI.

## 2013-02-02 NOTE — Assessment & Plan Note (Signed)
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  Continue aggressive risk factor modification.  Continues to follow up with Dr Darrold Junker.  Appears to be stable.

## 2013-02-02 NOTE — Assessment & Plan Note (Signed)
EGD recently revealed ulcer.  On nexium.  Just saw GI.  Hgb per his report still decreased but stable.  Continue to follow up with GI.

## 2013-02-02 NOTE — Assessment & Plan Note (Signed)
Last check stable.  Follow.   

## 2013-02-02 NOTE — Progress Notes (Signed)
Subjective:    Patient ID: Troy Lopez, male    DOB: [birth year], 72 y.o.   MRN: [id number]  HPI 72 year old male with past history of hypertension, hypercholesterolemia, IBS and monoclonal gammopathy.  He comes in today for a scheduled follow up.  He was admitted 01/22/13 for GI bleed.  Was having increased weakness and black stool.   Was found to have ulcer on EGD.  Hgb was 10.6 in hospital (per pt).  Unsure of discharge hgb.  Did not require transfusion.  Saw [facility] in follow up on 01/29/13.  States hgb was stable.  On iron supplements (integra).  Has lost a few pounds in the last week.  He is eating.  He reports feeling dizzy when he stands.  Worsens if he stands for any length of time.  Had an occurrence yesterday where he felt dizzy.  Blood pressure 98/53 after standing.  Sitting blood pressure 124/82.  Off Ibuprofen now.  Taking tylenol and tramadol for his back.  Increased back pain.  Has had previous MRI.  Has seen Dr Yves Dill.  Had three injections.  No relief. Saw surgery.  They recommended back surgery.  Standing aggravates.  As soon as he sits down, pain resolves.  No chest pain or tightness.  Breathing stable.      Past Medical History  Diagnosis Date  . Hypertension   . Hypercholesterolemia   . Irritable bowel syndrome   . GERD (gastroesophageal reflux disease)   . Colonic polyp   . Monoclonal gammopathy   . Hyperkalemia   . COPD (chronic obstructive pulmonary disease)   . Adrenal gland anomaly     enlargement  . Diverticulosis   . Depression   . Degenerative disc disease, lumbar   . Carpal tunnel syndrome     Current Outpatient Prescriptions on File Prior to Visit  Medication Sig Dispense Refill  . amLODipine (NORVASC) 10 MG tablet Take 1 tablet (10 mg total) by mouth daily.  30 tablet  5  . cloNIDine (CATAPRES) 0.1 MG tablet Take 1 tablet (0.1 mg total) by mouth 2 (two) times daily.  60 tablet  5  . fluticasone (FLONASE) 50 MCG/ACT nasal spray Place 2 sprays into  the nose daily.  16 g  5  . gabapentin (NEURONTIN) 100 MG capsule Take two each morning, two each afternoon and three at bedtime  210 capsule  2  . imipramine (TOFRANIL) 25 MG tablet Take 50 mg by mouth at bedtime.       Marland Kitchen losartan (COZAAR) 100 MG tablet Take 1 tablet (100 mg total) by mouth daily.  30 tablet  5  . metoprolol (LOPRESSOR) 100 MG tablet Take 1 tablet (100 mg total) by mouth 2 (two) times daily.  60 tablet  5  . Psyllium-Calcium (METAMUCIL PLUS CALCIUM PO) Take by mouth daily. 2 cap daily       No current facility-administered medications on file prior to visit.    Review of Systems does report noticing a headache recently.  Dizziness as outlined.  No sinus or allergy symptoms.   No chest pain, tightness or palpitations.  Breathing stable.  More fatigued.  No increased cough or congestion currently.   No vomiting. No nausea now.  Still some loose stool, but more formed.  No urine change.  Back pain as outlined.       Objective:   Physical Exam  Filed Vitals:   02/02/13 1120  BP: 110/80  Pulse: 66  Temp: 97.8 F (36.6  C)   Blood pressure recheck:  124/72 lying and 110/68 standing.  Pulse 83  72 year old male in no acute distress.  HEENT:  Nares - clear.  Oropharynx - without lesions. NECK:  Supple.  Nontender.  No audible carotid bruit.  HEART:  Appears to be regular.   LUNGS:  No crackles or wheezing audible.  Respirations even and unlabored.   RADIAL PULSE:  Equal bilaterally.  ABDOMEN:  Soft.  Nontender.  Bowel sounds present and normal.  No audible abdominal bruit.   EXTREMITIES:  No increased edema present.  DP pulses palpable and equal bilaterally.          Assessment & Plan:  PULMONARY.  Has quit smoking.  Follow.  Breathing stable.   MSK.  Has seen Dr Channing Mutters.  Recommended surgery.  Ultram q 6 hours with tylenol.  Avoid antiinflammatories.     INCREASED PSYCHOSOCIAL STRESSORS/DEPRESSION.  Was on Zoloft.  Is not taking now.  On increased imipramine now.    Stable.  Follow.  Have discussed the need to cut back or stop the alcohol.  HEALTH MAINTENANCE. Physical 01/01/13.  Has had extensive GI w/up.  Seeing GI now.  PSA 01/29/12 - 1.48.  Will check PSA with next labs.    I spent over 45 minutes with the patient and more than 50% of the time was spent in consultation regarding the above.

## 2013-02-02 NOTE — Assessment & Plan Note (Signed)
Blood pressure low.  Drops when he is standing.  He is already off lasix.  Will decrease clonidine to q day dosing.  Will also decrease amlodipine to 1/2 tablet q day.  Follow pressures.  Continue to adjust medication as needed.

## 2013-02-02 NOTE — Assessment & Plan Note (Signed)
Persistent problem.  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  Follow sodium levels.  Was found to have adrenal gland enlargement on CT - unchanged from 2007.  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  Follow.  Blood pressure has been under good control.  Discussed the need to cut down on his alcohol intake and eat regular meals.  Could be contributing.  Last sodium stable at 128.  Follow.

## 2013-02-09 ENCOUNTER — Encounter: Payer: Self-pay | Admitting: Internal Medicine

## 2013-02-12 ENCOUNTER — Encounter: Payer: Self-pay | Admitting: Internal Medicine

## 2013-03-01 ENCOUNTER — Encounter: Payer: Self-pay | Admitting: Internal Medicine

## 2013-03-23 ENCOUNTER — Encounter: Payer: Self-pay | Admitting: Internal Medicine

## 2013-03-25 ENCOUNTER — Ambulatory Visit (INDEPENDENT_AMBULATORY_CARE_PROVIDER_SITE_OTHER): Payer: Medicare Other | Admitting: Internal Medicine

## 2013-03-25 ENCOUNTER — Encounter: Payer: Self-pay | Admitting: Internal Medicine

## 2013-03-25 VITALS — BP 110/70 | HR 61 | Temp 98.4°F | Ht 71.0 in | Wt 188.5 lb

## 2013-03-25 DIAGNOSIS — D649 Anemia, unspecified: Secondary | ICD-10-CM

## 2013-03-25 DIAGNOSIS — L98499 Non-pressure chronic ulcer of skin of other sites with unspecified severity: Secondary | ICD-10-CM

## 2013-03-25 DIAGNOSIS — K219 Gastro-esophageal reflux disease without esophagitis: Secondary | ICD-10-CM

## 2013-03-25 DIAGNOSIS — G4730 Sleep apnea, unspecified: Secondary | ICD-10-CM

## 2013-03-25 DIAGNOSIS — E875 Hyperkalemia: Secondary | ICD-10-CM

## 2013-03-25 DIAGNOSIS — M549 Dorsalgia, unspecified: Secondary | ICD-10-CM

## 2013-03-25 DIAGNOSIS — D472 Monoclonal gammopathy: Secondary | ICD-10-CM

## 2013-03-25 DIAGNOSIS — I2510 Atherosclerotic heart disease of native coronary artery without angina pectoris: Secondary | ICD-10-CM

## 2013-03-25 DIAGNOSIS — IMO0002 Reserved for concepts with insufficient information to code with codable children: Secondary | ICD-10-CM

## 2013-03-25 DIAGNOSIS — I10 Essential (primary) hypertension: Secondary | ICD-10-CM

## 2013-03-25 DIAGNOSIS — G8929 Other chronic pain: Secondary | ICD-10-CM

## 2013-03-25 DIAGNOSIS — E871 Hypo-osmolality and hyponatremia: Secondary | ICD-10-CM

## 2013-03-25 MED ORDER — FLUOXETINE HCL 10 MG PO TABS: 10.0000 mg | ORAL_TABLET | Freq: Every day | ORAL | Status: DC

## 2013-03-27 ENCOUNTER — Encounter: Payer: Self-pay | Admitting: Internal Medicine

## 2013-03-27 NOTE — Assessment & Plan Note (Signed)
He restarted his lasix.  Only taking the clonidine q hs now.  On amlodipine 1/2 tablet q day.  Will taper off the clonidine.  Continue other bp meds as he is doing now.  Follow pressures.  Get him back in soon to reassess.

## 2013-03-27 NOTE — Assessment & Plan Note (Signed)
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  Continue aggressive risk factor modification.  Continues to follow up with Dr Darrold Junker.  Appears to be stable.

## 2013-03-27 NOTE — Assessment & Plan Note (Signed)
Pain worsened now.  Off ibuprofen.  Take tramadol qid and tylenol.  Has had MRI.  Has seen neurosurgery and Dr Yves Dill.  Is s/p injections.  Refer to pain clinic for evaluation and treatment.

## 2013-03-27 NOTE — Assessment & Plan Note (Signed)
Confirmed with sleep study.   Try to tolerate CPAP.   

## 2013-03-27 NOTE — Assessment & Plan Note (Signed)
EGD as outlined.  On nexium.  Continues to follow up with GI.

## 2013-03-27 NOTE — Assessment & Plan Note (Signed)
M spike has been relatively stable.  With the anemia (even prior to the bleed), back pain and persistent elevation, would like for hematology to reevaluate.  Was referred back to hematology.  Follow.   

## 2013-03-27 NOTE — Assessment & Plan Note (Signed)
Persistent problem.  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  Follow sodium levels.  Was found to have adrenal gland enlargement on CT - unchanged from 2007.  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  Follow.  Blood pressure has been under good control.  Discussed the need to cut down on his alcohol intake and eat regular meals.  Could be contributing.  Last sodium stable.   Follow.

## 2013-03-27 NOTE — Assessment & Plan Note (Signed)
Has had extensive GI w/up previously.  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  Recently admitted with GI bleed.  On iron.  Per pt, hgb just checked through GI and was improved (around 12.5).   Will need to follow.  Was referred back to hematology for evaluation.

## 2013-03-27 NOTE — Progress Notes (Signed)
Subjective:    Patient ID: Troy Lopez, male    DOB: 1941-08-05, 72 y.o.   MRN: [id number]  HPI 72 year old male with past history of hypertension, hypercholesterolemia, IBS and monoclonal gammopathy.  He comes in today for a scheduled follow up.  He was admitted 01/22/13 for GI bleed.  Was having increased weakness and black stool.   Was found to have ulcer on EGD.   Did not require transfusion.  On iron supplements (integra).  He is eating.  Off Ibuprofen now.  Taking tylenol and tramadol for his back.  Increased back pain.  Has had previous MRI.  Has seen Dr Yves Dill.  Had three injections.  No relief. Saw surgery.  They recommended back surgery.  No chest pain or tightness.  Breathing stable.  Blood pressure better since adjusting his meds.  No dizziness.     Past Medical History  Diagnosis Date  . Hypertension   . Hypercholesterolemia   . Irritable bowel syndrome   . GERD (gastroesophageal reflux disease)   . Colonic polyp   . Monoclonal gammopathy   . Hyperkalemia   . COPD (chronic obstructive pulmonary disease)   . Adrenal gland anomaly     enlargement  . Diverticulosis   . Depression   . Degenerative disc disease, lumbar   . Carpal tunnel syndrome     Current Outpatient Prescriptions on File Prior to Visit  Medication Sig Dispense Refill  . acetaminophen (TYLENOL) 325 MG tablet Take 650 mg by mouth every 6 (six) hours as needed for pain.      Marland Kitchen amLODipine (NORVASC) 10 MG tablet Take 1 tablet (10 mg total) by mouth daily.  30 tablet  5  . cloNIDine (CATAPRES) 0.1 MG tablet Take 1 tablet (0.1 mg total) by mouth 2 (two) times daily.  60 tablet  5  . Fe Fum-FePoly-Vit C-Vit B3 (INTEGRA PO) Take by mouth every morning.      . fluticasone (FLONASE) 50 MCG/ACT nasal spray Place 2 sprays into the nose daily.  16 g  5  . gabapentin (NEURONTIN) 100 MG capsule Take two each morning, two each afternoon and three at bedtime  210 capsule  2  . losartan (COZAAR) 100 MG tablet Take 1 tablet  (100 mg total) by mouth daily.  30 tablet  5  . metoprolol (LOPRESSOR) 100 MG tablet Take 1 tablet (100 mg total) by mouth 2 (two) times daily.  60 tablet  5  . Psyllium-Calcium (METAMUCIL PLUS CALCIUM PO) Take by mouth daily. 2 cap daily      . traMADol (ULTRAM) 50 MG tablet Take 1 tablet (50 mg total) by mouth every 6 (six) hours as needed for pain.  120 tablet  1   No current facility-administered medications on file prior to visit.    Review of Systems No headache or dizziness.  No sinus or allergy symptoms.   No chest pain, tightness or palpitations.  Breathing stable.  Energy improved.  No increased cough or congestion currently.   No vomiting. No nausea now.  Bowels stable.  No urine change.  Back pain as outlined.  Feels he needs something more.  See above.  Has had extensive w/up.  Unable to tolerate narcotic medication.  Cannot take antiinflammatories.       Objective:   Physical Exam  Filed Vitals:   03/25/13 1148  BP: 110/70  Pulse: 61  Temp: 98.4 F (36.9 C)   Blood pressure recheck:  48/26  72 year old male  in no acute distress.  HEENT:  Nares - clear.  Oropharynx - without lesions. NECK:  Supple.  Nontender.  No audible carotid bruit.  HEART:  Appears to be regular.   LUNGS:  No crackles or wheezing audible.  Respirations even and unlabored.   RADIAL PULSE:  Equal bilaterally.  ABDOMEN:  Soft.  Nontender.  Bowel sounds present and normal.  No audible abdominal bruit.   EXTREMITIES:  No increased edema present.  DP pulses palpable and equal bilaterally.  MSK:  Increased pain and stiffness with standing (after sitting for a while).           Assessment & Plan:  PULMONARY.  Has quit smoking.  Follow.  Breathing stable.   MSK.  Has seen Dr Channing Mutters.  Recommended surgery.  Ultram q 6 hours with tylenol.  Avoid antiinflammatories.  Given persistent pain, will refer to pain clinic.     INCREASED PSYCHOSOCIAL STRESSORS/DEPRESSION.  Feels he needs to be on something more.   Will decrease the imipramine to 25mg  q hs.  Plans to continue to taper off.  States he was put on this for his bowels and reports no change since being on the medication.  Previously on zoloft.  Given his issues with low sodium and given persistent issues with increased stress and depression, will start prozac 10mg  q day.  Increase as tolerates.  Follow.  Discussed at length with him regarding cutting back/stoppiing alcohol intake.  Will see back soon to reassess.    HEALTH MAINTENANCE. Physical 01/01/13.  Has had extensive GI w/up.  Seeing GI now.  PSA 01/29/12 - 1.48.  Will check PSA with next labs.

## 2013-03-27 NOTE — Assessment & Plan Note (Signed)
EGD recently revealed ulcer.  On nexium.  Just saw GI.  Hgb per his report still decreased but stable.  Continue to follow up with GI.

## 2013-03-27 NOTE — Assessment & Plan Note (Signed)
Last check stable.  Follow.   

## 2013-04-08 ENCOUNTER — Encounter: Payer: Self-pay | Admitting: Internal Medicine

## 2013-04-08 MED ORDER — EPINEPHRINE 0.3 MG/0.3ML IJ SOAJ: 0.3000 mg | Freq: Once | INTRAMUSCULAR | Status: DC

## 2013-04-08 NOTE — Telephone Encounter (Signed)
rx sent in for epi pen.  With two refills.

## 2013-04-12 ENCOUNTER — Encounter: Payer: Self-pay | Admitting: *Deleted

## 2013-04-12 ENCOUNTER — Telehealth: Payer: Self-pay | Admitting: Internal Medicine

## 2013-04-12 MED ORDER — TRAMADOL HCL 50 MG PO TABS: 50.0000 mg | ORAL_TABLET | Freq: Four times a day (QID) | ORAL | Status: DC | PRN

## 2013-04-12 MED ORDER — FLUTICASONE PROPIONATE 50 MCG/ACT NA SUSP: 2.0000 | Freq: Every day | NASAL | Status: DC

## 2013-04-12 MED ORDER — GABAPENTIN 100 MG PO CAPS: ORAL_CAPSULE | ORAL | Status: DC

## 2013-04-12 MED ORDER — METOPROLOL TARTRATE 100 MG PO TABS: 100.0000 mg | ORAL_TABLET | Freq: Two times a day (BID) | ORAL | Status: DC

## 2013-04-12 NOTE — Telephone Encounter (Signed)
Refilled flonase, tramadol, metoprolol and gabapentin.

## 2013-04-30 ENCOUNTER — Ambulatory Visit: Payer: Self-pay | Admitting: Gastroenterology

## 2013-05-01 ENCOUNTER — Other Ambulatory Visit: Payer: Self-pay | Admitting: Gastroenterology

## 2013-05-02 LAB — WBCS, STOOL

## 2013-05-10 ENCOUNTER — Encounter: Payer: Self-pay | Admitting: Internal Medicine

## 2013-05-10 ENCOUNTER — Ambulatory Visit (INDEPENDENT_AMBULATORY_CARE_PROVIDER_SITE_OTHER): Payer: Medicare Other | Admitting: Internal Medicine

## 2013-05-10 VITALS — BP 148/82 | HR 70 | Temp 98.5°F | Resp 12 | Ht 71.0 in | Wt 194.5 lb

## 2013-05-10 DIAGNOSIS — G8929 Other chronic pain: Secondary | ICD-10-CM

## 2013-05-10 DIAGNOSIS — E871 Hypo-osmolality and hyponatremia: Secondary | ICD-10-CM

## 2013-05-10 DIAGNOSIS — K219 Gastro-esophageal reflux disease without esophagitis: Secondary | ICD-10-CM

## 2013-05-10 DIAGNOSIS — D472 Monoclonal gammopathy: Secondary | ICD-10-CM

## 2013-05-10 DIAGNOSIS — G4730 Sleep apnea, unspecified: Secondary | ICD-10-CM

## 2013-05-10 DIAGNOSIS — I2510 Atherosclerotic heart disease of native coronary artery without angina pectoris: Secondary | ICD-10-CM

## 2013-05-10 DIAGNOSIS — D649 Anemia, unspecified: Secondary | ICD-10-CM

## 2013-05-10 DIAGNOSIS — IMO0002 Reserved for concepts with insufficient information to code with codable children: Secondary | ICD-10-CM

## 2013-05-10 DIAGNOSIS — M549 Dorsalgia, unspecified: Secondary | ICD-10-CM

## 2013-05-10 DIAGNOSIS — L98499 Non-pressure chronic ulcer of skin of other sites with unspecified severity: Secondary | ICD-10-CM

## 2013-05-10 DIAGNOSIS — E875 Hyperkalemia: Secondary | ICD-10-CM

## 2013-05-10 DIAGNOSIS — R51 Headache: Secondary | ICD-10-CM

## 2013-05-10 DIAGNOSIS — I10 Essential (primary) hypertension: Secondary | ICD-10-CM

## 2013-05-10 DIAGNOSIS — R197 Diarrhea, unspecified: Secondary | ICD-10-CM

## 2013-05-10 MED ORDER — FLUOXETINE HCL 20 MG PO TABS: 20.0000 mg | ORAL_TABLET | Freq: Every day | ORAL | Status: DC

## 2013-05-11 ENCOUNTER — Encounter: Payer: Self-pay | Admitting: Internal Medicine

## 2013-05-11 DIAGNOSIS — R519 Headache, unspecified: Secondary | ICD-10-CM | POA: Insufficient documentation

## 2013-05-11 DIAGNOSIS — R197 Diarrhea, unspecified: Secondary | ICD-10-CM | POA: Insufficient documentation

## 2013-05-11 NOTE — Assessment & Plan Note (Signed)
Confirmed with sleep study.   Try to tolerate CPAP.   

## 2013-05-11 NOTE — Assessment & Plan Note (Signed)
Has had extensive GI w/up previously.  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  Recently admitted with GI bleed.  On iron.  Per pt, hgb just checked through GI and was improved (around 12.5).   Will need to follow.  Was referred back to hematology for evaluation.         

## 2013-05-11 NOTE — Assessment & Plan Note (Signed)
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  Elected medical management.  Continue aggressive risk factor modification.  Continues to follow up with Dr Paraschos.  Appears to be stable.    

## 2013-05-11 NOTE — Assessment & Plan Note (Addendum)
Persistent problem.  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  Follow sodium levels.  Was found to have adrenal gland enlargement on CT - unchanged from 2007.  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  Follow.  Blood pressure has been under good control.  Last sodium stable.   Follow.  

## 2013-05-11 NOTE — Assessment & Plan Note (Signed)
Blood pressure stable.  Off clonidine.  Only takes lasix prn.  Follow. Check metabolic panel.

## 2013-05-11 NOTE — Assessment & Plan Note (Signed)
EGD as outlined.  On nexium.  Continues to follow up with GI.  Symptoms stable.

## 2013-05-11 NOTE — Assessment & Plan Note (Signed)
EGD recently revealed ulcer.  On nexium.  Just saw GI.  Hgb per his report still decreased but stable.  Continue to follow up with GI.  Will need repeat cbc.

## 2013-05-11 NOTE — Assessment & Plan Note (Signed)
Persistent.  Undergoing GI w/up as outlined.  Planning for colonoscopy in two days.

## 2013-05-11 NOTE — Assessment & Plan Note (Signed)
M spike has been relatively stable.  With the anemia (even prior to the bleed), back pain and persistent elevation, would like for hematology to reevaluate.  Was referred back to hematology.  Follow.

## 2013-05-11 NOTE — Assessment & Plan Note (Signed)
Last check stable.  Follow.   

## 2013-05-11 NOTE — Progress Notes (Signed)
Subjective:    Patient ID: Troy Lopez, male    DOB: December 02, 1940, 72 y.o.   MRN: [id number]  HPI 72 year old male with past history of hypertension, hypercholesterolemia, IBS and monoclonal gammopathy.  He comes in today for a scheduled follow up.  He was admitted 01/22/13 for GI bleed.  Was having increased weakness and black stool.   Was found to have ulcer on EGD.   Did not require transfusion.  On iron supplements (integra).  He is eating.  Off Ibuprofen now.  Taking tylenol.  Back is better.  Has cut down on the amount of tramadol taking.  No chest pain or tightness.  Breathing stable. Blood pressure better since adjusting his meds.  No dizziness.   Started on prozac last visit.  Mood is better.  Doing fine on the lower dose of imipramine.  Agreeable to taper off.  He does report persistent diarrhea.  This has been going on for 4-5 weeks.  Seeing GI.  Had CT.  Apparently revealed some "colitis".  Stool test positive.  Treated with cipro.  Symptoms improved, but did not resolve.  Planning for colonoscopy in two days.  He also reports persistent intermittent headaches.  Has been present since his hospitalization.  Occurs 3-4 days/week.  No severe headache, but persistent.  When occurs involve the frontal region.  Right side may be slightly worse than left side.  States his blood pressure has been averaging 130s/80s.      Past Medical History  Diagnosis Date  . Hypertension   . Hypercholesterolemia   . Irritable bowel syndrome   . GERD (gastroesophageal reflux disease)   . Colonic polyp   . Monoclonal gammopathy   . Hyperkalemia   . COPD (chronic obstructive pulmonary disease)   . Adrenal gland anomaly     enlargement  . Diverticulosis   . Depression   . Degenerative disc disease, lumbar   . Carpal tunnel syndrome     Current Outpatient Prescriptions on File Prior to Visit  Medication Sig Dispense Refill  . acetaminophen (TYLENOL) 325 MG tablet Take 650 mg by mouth every 6 (six) hours as  needed for pain.      . cloNIDine (CATAPRES) 0.1 MG tablet Take 1 tablet (0.1 mg total) by mouth 2 (two) times daily.  60 tablet  5  . EPINEPHrine (EPI-PEN) 0.3 mg/0.3 mL SOAJ injection Inject 0.3 mLs (0.3 mg total) into the muscle once.  1 Device  2  . fluticasone (FLONASE) 50 MCG/ACT nasal spray Place 2 sprays into the nose daily.  16 g  5  . gabapentin (NEURONTIN) 100 MG capsule Take two each morning, two each afternoon and three at bedtime  210 capsule  3  . imipramine (TOFRANIL) 25 MG tablet Take 25 mg by mouth at bedtime.      Marland Kitchen losartan (COZAAR) 100 MG tablet Take 1 tablet (100 mg total) by mouth daily.  30 tablet  5  . metoprolol (LOPRESSOR) 100 MG tablet Take 1 tablet (100 mg total) by mouth 2 (two) times daily.  60 tablet  5  . pantoprazole (PROTONIX) 40 MG tablet Take 40 mg by mouth 2 (two) times daily.      . Psyllium-Calcium (METAMUCIL PLUS CALCIUM PO) Take by mouth daily. 2 cap daily      . traMADol (ULTRAM) 50 MG tablet Take 1 tablet (50 mg total) by mouth every 6 (six) hours as needed for pain.  120 tablet  1   No current facility-administered  medications on file prior to visit.    Review of Systems No dizziness.  Headache as outlined.  No sinus or allergy symptoms.   No chest pain, tightness or palpitations.  Breathing stable.  Energy improved.  No increased cough or congestion currently.   No vomiting.  Persistent diarrhea as outlined.  No urine change.  Back pain better.   Mood better.  Feels he needs to increase the prozac.  Tolerating.  Discussed alcohol intake.  Discussed the need to quit/cut down.  Discussed the possible interaction with alcohol and prozac.       Objective:   Physical Exam  Filed Vitals:   05/10/13 1124  BP: 148/82  Pulse: 70  Temp: 98.5 F (36.9 C)  Resp: 12   Blood pressure recheck:  7/4  72 year old male in no acute distress.  HEENT:  Nares - clear.  Oropharynx - without lesions. NECK:  Supple.  Nontender.  No audible carotid bruit.   HEART:  Appears to be regular.   LUNGS:  No crackles or wheezing audible.  Respirations even and unlabored.   RADIAL PULSE:  Equal bilaterally.  ABDOMEN:  Soft.  Nontender.  Bowel sounds present and normal.  No audible abdominal bruit.   EXTREMITIES:  No increased edema present.  DP pulses palpable and equal bilaterally.        Assessment & Plan:  PULMONARY.  Has quit smoking.  Follow.  Breathing stable.   MSK.  Has seen Dr Channing Mutters.  Recommended surgery.  Back better.  Follow.   INCREASED PSYCHOSOCIAL STRESSORS/DEPRESSION.  On prozac 10mg  q day.  Doing better.  Feel needs to increase the dose.  Will increase to 20mg  q day.  Taper off imipramine.  Needs to decrease/stop alcohol.  Discussed at length with him today.  Follow closely.     HEALTH MAINTENANCE. Physical 01/01/13.  Has had extensive GI w/up.  Seeing GI now.  Planning for colonoscopy in two days.  PSA 01/29/12 - 1.48.  Will check PSA with next labs.

## 2013-05-11 NOTE — Assessment & Plan Note (Signed)
Better.  Not needing the tramadol often.  Follow.

## 2013-05-11 NOTE — Assessment & Plan Note (Signed)
Discussed at length with him today.  Declines further w/up.  Obtain recent lab results from GI.  Check ESR.

## 2013-05-12 ENCOUNTER — Encounter: Payer: Self-pay | Admitting: Internal Medicine

## 2013-05-12 ENCOUNTER — Encounter: Payer: Self-pay | Admitting: *Deleted

## 2013-05-12 ENCOUNTER — Ambulatory Visit: Payer: Self-pay | Admitting: Gastroenterology

## 2013-05-12 DIAGNOSIS — R197 Diarrhea, unspecified: Secondary | ICD-10-CM

## 2013-05-13 LAB — PATHOLOGY REPORT

## 2013-05-18 ENCOUNTER — Encounter: Payer: Self-pay | Admitting: Internal Medicine

## 2013-05-20 ENCOUNTER — Encounter: Payer: Self-pay | Admitting: Internal Medicine

## 2013-05-24 ENCOUNTER — Encounter: Payer: Self-pay | Admitting: Internal Medicine

## 2013-05-27 ENCOUNTER — Encounter: Payer: Self-pay | Admitting: Internal Medicine

## 2013-05-27 DIAGNOSIS — R197 Diarrhea, unspecified: Secondary | ICD-10-CM

## 2013-06-04 ENCOUNTER — Encounter: Payer: Self-pay | Admitting: Internal Medicine

## 2013-06-08 ENCOUNTER — Encounter: Payer: Self-pay | Admitting: Internal Medicine

## 2013-06-09 ENCOUNTER — Encounter: Payer: Self-pay | Admitting: Internal Medicine

## 2013-06-23 ENCOUNTER — Ambulatory Visit: Payer: Self-pay | Admitting: Gastroenterology

## 2013-06-24 ENCOUNTER — Other Ambulatory Visit: Payer: Self-pay

## 2013-06-24 ENCOUNTER — Encounter: Payer: Self-pay | Admitting: Internal Medicine

## 2013-07-06 ENCOUNTER — Other Ambulatory Visit: Payer: Self-pay | Admitting: *Deleted

## 2013-07-07 MED ORDER — TRAMADOL HCL 50 MG PO TABS: 50.0000 mg | ORAL_TABLET | Freq: Four times a day (QID) | ORAL | Status: DC | PRN

## 2013-07-07 MED ORDER — LOSARTAN POTASSIUM 100 MG PO TABS: 100.0000 mg | ORAL_TABLET | Freq: Every day | ORAL | Status: DC

## 2013-07-07 NOTE — Telephone Encounter (Signed)
Refilled tramadol #120 with one refill.   

## 2013-07-07 NOTE — Telephone Encounter (Signed)
Faxed Rx to [facility]

## 2013-07-09 ENCOUNTER — Ambulatory Visit: Payer: Self-pay | Admitting: Gastroenterology

## 2013-07-20 ENCOUNTER — Encounter: Payer: Self-pay | Admitting: *Deleted

## 2013-07-21 ENCOUNTER — Encounter: Payer: Self-pay | Admitting: Internal Medicine

## 2013-07-21 ENCOUNTER — Encounter: Payer: Self-pay | Admitting: *Deleted

## 2013-07-21 ENCOUNTER — Ambulatory Visit (INDEPENDENT_AMBULATORY_CARE_PROVIDER_SITE_OTHER): Payer: Medicare Other | Admitting: Internal Medicine

## 2013-07-21 VITALS — BP 130/90 | HR 62 | Temp 98.4°F | Ht 71.0 in | Wt 193.2 lb

## 2013-07-21 DIAGNOSIS — R51 Headache: Secondary | ICD-10-CM

## 2013-07-21 DIAGNOSIS — E871 Hypo-osmolality and hyponatremia: Secondary | ICD-10-CM

## 2013-07-21 DIAGNOSIS — D649 Anemia, unspecified: Secondary | ICD-10-CM

## 2013-07-21 DIAGNOSIS — M549 Dorsalgia, unspecified: Secondary | ICD-10-CM

## 2013-07-21 DIAGNOSIS — D472 Monoclonal gammopathy: Secondary | ICD-10-CM

## 2013-07-21 DIAGNOSIS — G4730 Sleep apnea, unspecified: Secondary | ICD-10-CM

## 2013-07-21 DIAGNOSIS — G8929 Other chronic pain: Secondary | ICD-10-CM

## 2013-07-21 DIAGNOSIS — I10 Essential (primary) hypertension: Secondary | ICD-10-CM

## 2013-07-21 DIAGNOSIS — K219 Gastro-esophageal reflux disease without esophagitis: Secondary | ICD-10-CM

## 2013-07-21 DIAGNOSIS — R197 Diarrhea, unspecified: Secondary | ICD-10-CM

## 2013-07-21 DIAGNOSIS — Z125 Encounter for screening for malignant neoplasm of prostate: Secondary | ICD-10-CM

## 2013-07-21 DIAGNOSIS — I2510 Atherosclerotic heart disease of native coronary artery without angina pectoris: Secondary | ICD-10-CM

## 2013-07-21 DIAGNOSIS — L98499 Non-pressure chronic ulcer of skin of other sites with unspecified severity: Secondary | ICD-10-CM

## 2013-07-21 DIAGNOSIS — E875 Hyperkalemia: Secondary | ICD-10-CM

## 2013-07-21 DIAGNOSIS — IMO0002 Reserved for concepts with insufficient information to code with codable children: Secondary | ICD-10-CM

## 2013-07-21 NOTE — Progress Notes (Signed)
Subjective:    Patient ID: Troy Lopez, male    DOB: 10/30/1940, 72 y.o.   MRN: [id number]  HPI 72 year old male with past history of hypertension, hypercholesterolemia, IBS and monoclonal gammopathy.  He comes in today for a scheduled follow up.  He was admitted 01/22/13 for GI bleed.  Was having increased weakness and black stool.   Was found to have ulcer on EGD.   Did not require transfusion.  On iron supplements (integra).  He is eating.  Off Ibuprofen now.  Taking tylenol.  Back had been better.  Had cut down on the amount of tramadol taking.  Had some coughing recently.  Cough aggravated his back.  Was seen at [facility].  Treated with zpak.  Cough is better.  No chest pain or tightness.  Breathing stable. Blood pressure better since adjusting his meds.  Recently had to increase his norvasc to 10mg  q day.  Was elevated in GI office.  No dizziness.  On prozac.  Off imipramine.  Still with increased stress and some depression.  We discussed the need to decrease his alcohol intake.  Discussed the importance of not taking prozac and alcohol together.   He does report persistent bowel issues.   Seeing GI.  Had CT.  Apparently revealed some "colitis".   Had colonoscopy and EGD.  See report for details.  Ulcer healed.  Is s/p dilatation of his "duodenum" .  No nausea or vomiting since.   Unable to afford entocort.  Has f/u with GI next week.      Past Medical History  Diagnosis Date  . Hypertension   . Hypercholesterolemia   . Irritable bowel syndrome   . GERD (gastroesophageal reflux disease)   . Colonic polyp   . Monoclonal gammopathy   . Hyperkalemia   . COPD (chronic obstructive pulmonary disease)   . Adrenal gland anomaly     enlargement  . Diverticulosis   . Depression   . Degenerative disc disease, lumbar   . Carpal tunnel syndrome     Current Outpatient Prescriptions on File Prior to Visit  Medication Sig Dispense Refill  . acetaminophen (TYLENOL) 325 MG tablet Take 650  mg by mouth every 6 (six) hours as needed for pain.      Marland Kitchen amLODipine (NORVASC) 10 MG tablet Take 5 mg by mouth daily.      Marland Kitchen EPINEPHrine (EPI-PEN) 0.3 mg/0.3 mL SOAJ injection Inject 0.3 mLs (0.3 mg total) into the muscle once.  1 Device  2  . FLUoxetine (PROZAC) 20 MG tablet Take 1 tablet (20 mg total) by mouth daily.  30 tablet  3  . fluticasone (FLONASE) 50 MCG/ACT nasal spray Place 2 sprays into the nose daily.  16 g  5  . gabapentin (NEURONTIN) 100 MG capsule Take two each morning, two each afternoon and three at bedtime  210 capsule  3  . losartan (COZAAR) 100 MG tablet Take 1 tablet (100 mg total) by mouth daily.  30 tablet  5  . metoprolol (LOPRESSOR) 100 MG tablet Take 1 tablet (100 mg total) by mouth 2 (two) times daily.  60 tablet  5  . pantoprazole (PROTONIX) 40 MG tablet Take 40 mg by mouth 2 (two) times daily.      . Psyllium-Calcium (METAMUCIL PLUS CALCIUM PO) Take by mouth daily. 2 cap daily      . traMADol (ULTRAM) 50 MG tablet Take 1 tablet (50 mg total) by mouth every 6 (six) hours as needed.  120 tablet  1   No current facility-administered medications on file prior to visit.    Review of Systems No dizziness.  Headache as outlined.  No sinus or allergy symptoms.   No chest pain, tightness or palpitations.  Breathing stable.  Energy improved.  No increased cough or congestion currently.   No vomiting.  Persistent diarrhea as outlined.  No urine change.  Back pain better.   Mood better.  Feels he needs to increase the prozac.  Tolerating.  Discussed alcohol intake.  Discussed the need to quit/cut down.  Discussed the possible interaction with alcohol and prozac.       Objective:   Physical Exam  Filed Vitals:   07/21/13 1123  BP: 130/90  Pulse: 62  Temp: 98.4 F (36.9 C)   Blood pressure recheck:  64-54/13  72 year old male in no acute distress.  HEENT:  Nares - clear.  Oropharynx - without lesions. NECK:  Supple.  Nontender.  No audible carotid bruit.  HEART:   Appears to be regular.   LUNGS:  No crackles or wheezing audible.  Respirations even and unlabored.   RADIAL PULSE:  Equal bilaterally.  ABDOMEN:  Soft.  Nontender.  Bowel sounds present and normal.  No audible abdominal bruit.   EXTREMITIES:  No increased edema present.  DP pulses palpable and equal bilaterally.        Assessment & Plan:  PULMONARY.  Had quit smoking.  Restarted.  Need to quit.  Discussed with him today.  He plans to try to stop.    MSK.  Has seen Dr Channing Mutters.  Recommended surgery.  Back had been better.  Aggravated with increased coughing.  Follow.   INCREASED PSYCHOSOCIAL STRESSORS/DEPRESSION.  On prozac 20mg  q day.  Off imipramine.  Needs to decrease/stop alcohol.  Discussed at length with him today.  Follow closely.     HEALTH MAINTENANCE. Physical 01/01/13.  Has had extensive GI w/up.  Seeing GI now.  Had recent colonoscopy.   PSA 01/29/12 - 1.48.  Will check PSA with next labs.

## 2013-07-21 NOTE — Progress Notes (Signed)
Pre-visit discussion using our clinic review tool. No additional management support is needed unless otherwise documented below in the visit note.  

## 2013-07-25 ENCOUNTER — Encounter: Payer: Self-pay | Admitting: Internal Medicine

## 2013-07-25 ENCOUNTER — Telehealth: Payer: Self-pay | Admitting: Internal Medicine

## 2013-07-25 NOTE — Assessment & Plan Note (Signed)
Confirmed with sleep study.   Try to tolerate CPAP.   

## 2013-07-25 NOTE — Assessment & Plan Note (Signed)
Recent f/u EGD revealed ulcer - healed.    

## 2013-07-25 NOTE — Assessment & Plan Note (Signed)
Not reported as an issue today.  

## 2013-07-25 NOTE — Assessment & Plan Note (Signed)
Persistent problem.  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  Follow sodium levels.  Was found to have adrenal gland enlargement on CT - unchanged from 2007.  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  Follow.  Blood pressure has been under good control.  Last sodium stable.   Follow.  

## 2013-07-25 NOTE — Assessment & Plan Note (Signed)
Blood pressure better with increased norvasc.   Off clonidine.  Only takes lasix prn.  Follow.  Follow metabolic panel.   

## 2013-07-25 NOTE — Assessment & Plan Note (Signed)
M spike has been relatively stable.  With the anemia (even prior to the bleed), back pain and persistent elevation, would like for hematology to reevaluate.  Was referred back to hematology.  Need records.    

## 2013-07-25 NOTE — Assessment & Plan Note (Signed)
Last check stable.  Follow.   

## 2013-07-25 NOTE — Telephone Encounter (Signed)
Please schedule pt fasting lab appt within the next week.  Thanks.

## 2013-07-25 NOTE — Assessment & Plan Note (Signed)
EGD as outlined.  On nexium.  Continues to follow up with GI.  Symptoms stable.  No nausea and vomiting have resolved since dilatation of the duodenum.

## 2013-07-25 NOTE — Assessment & Plan Note (Signed)
Persistent.  Undergoing GI w/up as outlined.  Had colonoscopy.  Unable to afford entocort.

## 2013-07-25 NOTE — Assessment & Plan Note (Signed)
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  Continue aggressive risk factor modification.  Continues to follow up with Dr Darrold Junker.  Appears to be stable.

## 2013-07-25 NOTE — Assessment & Plan Note (Signed)
Had been better.  Recently aggravated by increased coughing.  Cough better now.  Follow.

## 2013-07-25 NOTE — Assessment & Plan Note (Signed)
Has had extensive GI w/up previously.  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  Recently admitted with GI bleed.  On iron.   Will need to follow.  Was referred back to hematology for evaluation.   

## 2013-07-26 NOTE — Telephone Encounter (Signed)
Left message for pt to call office

## 2013-07-28 NOTE — Telephone Encounter (Signed)
Troy Lopez made appointment 12/16

## 2013-08-03 ENCOUNTER — Other Ambulatory Visit (INDEPENDENT_AMBULATORY_CARE_PROVIDER_SITE_OTHER): Payer: Medicare Other

## 2013-08-03 DIAGNOSIS — Z125 Encounter for screening for malignant neoplasm of prostate: Secondary | ICD-10-CM

## 2013-08-03 DIAGNOSIS — E875 Hyperkalemia: Secondary | ICD-10-CM

## 2013-08-03 DIAGNOSIS — D649 Anemia, unspecified: Secondary | ICD-10-CM

## 2013-08-03 DIAGNOSIS — I2510 Atherosclerotic heart disease of native coronary artery without angina pectoris: Secondary | ICD-10-CM

## 2013-08-03 DIAGNOSIS — I10 Essential (primary) hypertension: Secondary | ICD-10-CM

## 2013-08-03 DIAGNOSIS — E871 Hypo-osmolality and hyponatremia: Secondary | ICD-10-CM

## 2013-08-03 LAB — BASIC METABOLIC PANEL
BUN: 8 mg/dL (ref 6–23)
CO2: 29 mEq/L (ref 19–32)
Chloride: 97 mEq/L (ref 96–112)
Creatinine, Ser: 1 mg/dL (ref 0.4–1.5)
Glucose, Bld: 99 mg/dL (ref 70–99)
Sodium: 131 mEq/L — ABNORMAL LOW (ref 135–145)

## 2013-08-03 LAB — CBC WITH DIFFERENTIAL/PLATELET
Basophils Relative: 1 % (ref 0.0–3.0)
Eosinophils Relative: 0.5 % (ref 0.0–5.0)
Hemoglobin: 14.4 g/dL (ref 13.0–17.0)
Lymphocytes Relative: 26.9 % (ref 12.0–46.0)
MCHC: 34.1 g/dL (ref 30.0–36.0)
Monocytes Relative: 8.8 % (ref 3.0–12.0)
Neutro Abs: 4.6 10*3/uL (ref 1.4–7.7)
Neutrophils Relative %: 62.8 % (ref 43.0–77.0)
RBC: 4.08 Mil/uL — ABNORMAL LOW (ref 4.22–5.81)
WBC: 7.4 10*3/uL (ref 4.5–10.5)

## 2013-08-03 LAB — PSA, MEDICARE: PSA: 1.15 ng/ml (ref 0.10–4.00)

## 2013-08-03 LAB — LIPID PANEL
Cholesterol: 168 mg/dL (ref 0–200)
Triglycerides: 73 mg/dL (ref 0.0–149.0)

## 2013-08-03 LAB — HEPATIC FUNCTION PANEL
ALT: 16 U/L (ref 0–53)
Albumin: 3.5 g/dL (ref 3.5–5.2)
Total Protein: 8.1 g/dL (ref 6.0–8.3)

## 2013-08-03 LAB — VITAMIN B12: Vitamin B-12: 394 pg/mL (ref 211–911)

## 2013-08-10 ENCOUNTER — Ambulatory Visit: Payer: Self-pay | Admitting: Gastroenterology

## 2013-08-11 ENCOUNTER — Encounter: Payer: Self-pay | Admitting: Internal Medicine

## 2013-08-20 ENCOUNTER — Telehealth: Payer: Self-pay | Admitting: Internal Medicine

## 2013-08-20 MED ORDER — PRAVASTATIN SODIUM 10 MG PO TABS: 10.0000 mg | ORAL_TABLET | Freq: Every day | ORAL | Status: DC

## 2013-08-20 NOTE — Telephone Encounter (Signed)
Prescription sent inf for pravastatin 10mg  q day #30 with 3 refills.

## 2013-08-20 NOTE — Telephone Encounter (Signed)
April with [facility] clinic gi called mr Cullop has a follow up appointment Monday @ 1  She needs a referral for this appointment.  Mr Stemm has the new [employer] that require the referral

## 2013-08-23 NOTE — Telephone Encounter (Signed)
We cannot proceed with referral until we have insurance information. Referral for [employer] has been filled out. LVM for patient to call our office

## 2013-08-23 NOTE — Telephone Encounter (Signed)
Spoke with patient who gave me the information for his new insurance. Referral for silverback has been faxed.

## 2013-08-24 NOTE — Telephone Encounter (Signed)
[employer] back has approved for pt to be seen

## 2013-08-30 ENCOUNTER — Encounter: Payer: Self-pay | Admitting: Internal Medicine

## 2013-08-30 ENCOUNTER — Encounter: Payer: Self-pay | Admitting: Emergency Medicine

## 2013-08-30 NOTE — Telephone Encounter (Signed)
Mychart mess sent to pt to ask him to give me updated insurance info to proceed with referral.

## 2013-09-03 ENCOUNTER — Encounter: Payer: Self-pay | Admitting: Internal Medicine

## 2013-09-17 ENCOUNTER — Other Ambulatory Visit: Payer: Self-pay | Admitting: Internal Medicine

## 2013-09-17 MED ORDER — TRAMADOL HCL 50 MG PO TABS: 50.0000 mg | ORAL_TABLET | Freq: Four times a day (QID) | ORAL | Status: DC | PRN

## 2013-09-17 MED ORDER — FLUOXETINE HCL 20 MG PO TABS: 20.0000 mg | ORAL_TABLET | Freq: Every day | ORAL | Status: DC

## 2013-09-17 NOTE — Telephone Encounter (Signed)
Refilled prozac #30 with no refills.  Refilled ultram #120 with one refill.

## 2013-10-05 ENCOUNTER — Ambulatory Visit: Payer: Medicare Other | Admitting: Internal Medicine

## 2013-10-17 ENCOUNTER — Encounter: Payer: Self-pay | Admitting: Internal Medicine

## 2013-10-17 DIAGNOSIS — K219 Gastro-esophageal reflux disease without esophagitis: Secondary | ICD-10-CM

## 2013-10-19 ENCOUNTER — Other Ambulatory Visit: Payer: Self-pay | Admitting: *Deleted

## 2013-10-19 NOTE — Telephone Encounter (Signed)
Okay to refill? Last filled on 09/17/13

## 2013-10-20 ENCOUNTER — Encounter: Payer: Self-pay | Admitting: *Deleted

## 2013-10-20 MED ORDER — TRAMADOL HCL 50 MG PO TABS: 50.0000 mg | ORAL_TABLET | Freq: Four times a day (QID) | ORAL | Status: DC | PRN

## 2013-10-20 NOTE — Telephone Encounter (Signed)
Refilled tramadol #120 with one refill.   

## 2013-10-22 ENCOUNTER — Encounter: Payer: Self-pay | Admitting: *Deleted

## 2013-10-22 ENCOUNTER — Other Ambulatory Visit: Payer: Commercial Managed Care - HMO

## 2013-10-22 NOTE — Progress Notes (Signed)
Pt came in for UDS

## 2013-11-04 ENCOUNTER — Other Ambulatory Visit: Payer: Self-pay | Admitting: *Deleted

## 2013-11-12 MED ORDER — AMLODIPINE BESYLATE 10 MG PO TABS
10.0000 mg | ORAL_TABLET | Freq: Every day | ORAL | Status: DC
Start: ? — End: 2014-03-20

## 2013-11-16 ENCOUNTER — Other Ambulatory Visit: Payer: Self-pay | Admitting: *Deleted

## 2013-11-16 MED ORDER — FUROSEMIDE 20 MG PO TABS: 20.0000 mg | ORAL_TABLET | Freq: Every day | ORAL | Status: DC | PRN

## 2013-11-18 ENCOUNTER — Encounter: Payer: Self-pay | Admitting: Internal Medicine

## 2013-11-18 ENCOUNTER — Ambulatory Visit (INDEPENDENT_AMBULATORY_CARE_PROVIDER_SITE_OTHER): Payer: Commercial Managed Care - HMO | Admitting: Internal Medicine

## 2013-11-18 VITALS — BP 128/76 | HR 56 | Temp 98.6°F | Resp 16 | Ht 71.0 in | Wt 194.8 lb

## 2013-11-18 DIAGNOSIS — R0789 Other chest pain: Secondary | ICD-10-CM

## 2013-11-18 DIAGNOSIS — H029 Unspecified disorder of eyelid: Secondary | ICD-10-CM

## 2013-11-18 DIAGNOSIS — K219 Gastro-esophageal reflux disease without esophagitis: Secondary | ICD-10-CM

## 2013-11-18 DIAGNOSIS — D649 Anemia, unspecified: Secondary | ICD-10-CM

## 2013-11-18 DIAGNOSIS — E871 Hypo-osmolality and hyponatremia: Secondary | ICD-10-CM

## 2013-11-18 DIAGNOSIS — M549 Dorsalgia, unspecified: Secondary | ICD-10-CM

## 2013-11-18 DIAGNOSIS — L98499 Non-pressure chronic ulcer of skin of other sites with unspecified severity: Secondary | ICD-10-CM

## 2013-11-18 DIAGNOSIS — IMO0002 Reserved for concepts with insufficient information to code with codable children: Secondary | ICD-10-CM

## 2013-11-18 DIAGNOSIS — I2510 Atherosclerotic heart disease of native coronary artery without angina pectoris: Secondary | ICD-10-CM

## 2013-11-18 DIAGNOSIS — G8929 Other chronic pain: Secondary | ICD-10-CM

## 2013-11-18 DIAGNOSIS — G4730 Sleep apnea, unspecified: Secondary | ICD-10-CM

## 2013-11-18 DIAGNOSIS — I10 Essential (primary) hypertension: Secondary | ICD-10-CM

## 2013-11-18 DIAGNOSIS — D472 Monoclonal gammopathy: Secondary | ICD-10-CM

## 2013-11-18 DIAGNOSIS — R0602 Shortness of breath: Secondary | ICD-10-CM

## 2013-11-18 DIAGNOSIS — R197 Diarrhea, unspecified: Secondary | ICD-10-CM

## 2013-11-18 MED ORDER — TIOTROPIUM BROMIDE MONOHYDRATE 18 MCG IN CAPS: 18.0000 ug | ORAL_CAPSULE | Freq: Every day | RESPIRATORY_TRACT | Status: DC

## 2013-11-18 NOTE — Progress Notes (Signed)
Subjective:    Patient ID: Troy Lopez, male    DOB: 1941/01/07, 73 y.o.   MRN: [id number]  HPI 73 year old male with past history of hypertension, hypercholesterolemia, IBS and monoclonal gammopathy.  He comes in today for a scheduled follow up.  He was admitted 01/22/13 for GI bleed.  Was having increased weakness and black stool.   Was found to have ulcer on EGD.   Did not require transfusion.  On iron supplements (integra).  Still having stomach issues.  S/p recent f/u EGD and dilatation.  No significant swallowing issues.  Planning to f/u with Dr Stephanie Acre 12/02/13.  He is eating.  Trying to remain off antiinflammatories.  Taking tylenol.  Still with some cough.  Gets short winded with exertion.  This is a change for him.  No chest pain or tightness.  Blood pressure better since adjusting his meds.  Recently had to increase his norvasc to 10mg  q day.  Was elevated in GI office.  No dizziness.  On prozac.  Off imipramine.  Depression better.  We discussed the need to decrease his alcohol intake.  Discussed the importance of not taking prozac and alcohol together.   States he is not going to stop drinking, so he wants to taper his prozac.  Still with some post nasal drainage.  Discussed using flonase.  Has a lesion on his right lower eyelid.  Overdue an eye exam.       Past Medical History  Diagnosis Date  . Hypertension   . Hypercholesterolemia   . Irritable bowel syndrome   . GERD (gastroesophageal reflux disease)   . Colonic polyp   . Monoclonal gammopathy   . Hyperkalemia   . COPD (chronic obstructive pulmonary disease)   . Adrenal gland anomaly     enlargement  . Diverticulosis   . Depression   . Degenerative disc disease, lumbar   . Carpal tunnel syndrome     Current Outpatient Prescriptions on File Prior to Visit  Medication Sig Dispense Refill  . acetaminophen (TYLENOL) 325 MG tablet Take 650 mg by mouth every 6 (six) hours as needed for pain.      Marland Kitchen amLODipine (NORVASC) 10 MG  tablet Take 1 tablet (10 mg total) by mouth daily.  30 tablet  5  . EPINEPHrine (EPI-PEN) 0.3 mg/0.3 mL SOAJ injection Inject 0.3 mLs (0.3 mg total) into the muscle once.  1 Device  2  . FLUoxetine (PROZAC) 20 MG tablet Take 1 tablet (20 mg total) by mouth daily.  30 tablet  0  . fluticasone (FLONASE) 50 MCG/ACT nasal spray Place 2 sprays into the nose daily.  16 g  5  . furosemide (LASIX) 20 MG tablet Take 1 tablet (20 mg total) by mouth daily as needed.  30 tablet  0  . gabapentin (NEURONTIN) 100 MG capsule Take two each morning, two each afternoon and three at bedtime  210 capsule  3  . losartan (COZAAR) 100 MG tablet Take 1 tablet (100 mg total) by mouth daily.  30 tablet  5  . metoprolol (LOPRESSOR) 100 MG tablet Take 1 tablet (100 mg total) by mouth 2 (two) times daily.  60 tablet  5  . pantoprazole (PROTONIX) 40 MG tablet Take 40 mg by mouth 2 (two) times daily.      . traMADol (ULTRAM) 50 MG tablet Take 1 tablet (50 mg total) by mouth every 6 (six) hours as needed.  120 tablet  1   No current facility-administered medications  on file prior to visit.    Review of Systems No dizziness.  No headache.  No sinus or allergy symptoms.  Some post nasal drainage.   No chest pain, tightness or palpitations.  SOB with exertion.  Some cough.  No vomiting.  Persistent diarrhea and bowel issues as outlined.   No urine change.  Discussed alcohol intake.  Discussed the need to quit/cut down.  Discussed the possible interaction with alcohol and prozac.  States he is not going to quit drinking.  Wants to taper down the prozac.        Objective:   Physical Exam  Filed Vitals:   11/18/13 1125  BP: 128/76  Pulse: 56  Temp: 98.6 F (37 C)  Resp: 73   73 year old male in no acute distress.  HEENT:  Nares - clear.  Oropharynx - without lesions. NECK:  Supple.  Nontender.  No audible carotid bruit.  HEART:  Appears to be regular.   LUNGS:  No crackles or wheezing audible.  Respirations even and  unlabored.   RADIAL PULSE:  Equal bilaterally.  ABDOMEN:  Soft.  Nontender.  Bowel sounds present and normal.  No audible abdominal bruit.   EXTREMITIES:  No increased edema present.  DP pulses palpable and equal bilaterally.        Assessment & Plan:  PULMONARY.  Had quit smoking.  Restarted.  Need to quit.  Discussed with him today.  He plans to try to stop.  Some cough.  Treat the post nasal drainage with flonase.  Check CXR.  Cardiac w/up recommended for the sob with exertion.     MSK.  Has seen Dr Carloyn Manner.  Recommended surgery.  Currently stable.  Follow.   INCREASED PSYCHOSOCIAL STRESSORS/DEPRESSION.  On prozac 20mg  q day.  Off imipramine.  Needs to decrease/stop alcohol.  Discussed at length with him today.  He plans to taper the prozac.  States is not going to stop drinking.   Follow closely.     HEALTH MAINTENANCE. Physical 01/01/13.  Has had extensive GI w/up.  Seeing GI now.  Had recent colonoscopy.   PSA 08/03/13 - 1.15.     I spent 40 minutes with the patient and more than 50% of the time was spent in consultation regarding the above.

## 2013-11-18 NOTE — Progress Notes (Signed)
Pre visit review using our clinic review tool, if applicable. No additional management support is needed unless otherwise documented below in the visit note. 

## 2013-11-21 ENCOUNTER — Encounter: Payer: Self-pay | Admitting: Internal Medicine

## 2013-11-21 DIAGNOSIS — H029 Unspecified disorder of eyelid: Secondary | ICD-10-CM | POA: Insufficient documentation

## 2013-11-21 DIAGNOSIS — R0602 Shortness of breath: Secondary | ICD-10-CM | POA: Insufficient documentation

## 2013-11-21 NOTE — Assessment & Plan Note (Signed)
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  Continue aggressive risk factor modification.  Continues to follow up with Dr Saralyn Pilar.  SOB with exertion.  EKG obtained and revealed SR with RBBB.  TWI in v1 and v2.  No acute ischemic changes.  Discussed with him regarding my desire for further cardiac w/up.  He declines at this time.  Wants to monitor.  Will notify me or be reevaluated if symptoms persist or worsen.

## 2013-11-21 NOTE — Assessment & Plan Note (Signed)
Has had extensive GI w/up previously.  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  Recently admitted with GI bleed.  On iron.   Will need to follow.  Was referred back to hematology for evaluation.

## 2013-11-21 NOTE — Assessment & Plan Note (Signed)
Persistent.  Undergoing GI w/up as outlined.  Had colonoscopy.  Still with bowel flares and GI issues.  Has f/u scheduled with GI 12/02/13.

## 2013-11-21 NOTE — Assessment & Plan Note (Addendum)
EKG as outlined.  Declines further w/up at this point.  Check CXR.  Treat postnasal drainage.  Restart spiriva.

## 2013-11-21 NOTE — Assessment & Plan Note (Signed)
Recent f/u EGD revealed ulcer - healed.    

## 2013-11-21 NOTE — Assessment & Plan Note (Signed)
Persistent lesion.  Needs eye exam as well.  Refer to opthalmology.

## 2013-11-21 NOTE — Assessment & Plan Note (Signed)
Blood pressure better with increased norvasc.   Off clonidine.  Only takes lasix prn.  Follow.  Follow metabolic panel.

## 2013-11-21 NOTE — Assessment & Plan Note (Signed)
M spike has been relatively stable.  With the anemia (even prior to the bleed), back pain and persistent elevation, would like for hematology to reevaluate.  Was referred back to hematology.  Need records.

## 2013-11-21 NOTE — Assessment & Plan Note (Signed)
EGD as outlined.  On nexium.  Continues to follow up with GI.  Symptoms as outlined.     

## 2013-11-21 NOTE — Assessment & Plan Note (Signed)
Confirmed with sleep study.   Try to tolerate CPAP.   

## 2013-11-21 NOTE — Assessment & Plan Note (Signed)
Stable.  Has tramadol prn.    

## 2013-11-21 NOTE — Assessment & Plan Note (Signed)
Persistent problem.  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  Follow sodium levels.  Was found to have adrenal gland enlargement on CT - unchanged from 2007.  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  Follow.  Blood pressure has been under good control.  Last sodium stable.   Follow.

## 2013-11-22 ENCOUNTER — Encounter: Payer: Self-pay | Admitting: Internal Medicine

## 2013-11-22 ENCOUNTER — Ambulatory Visit (INDEPENDENT_AMBULATORY_CARE_PROVIDER_SITE_OTHER)
Admission: RE | Admit: 2013-11-22 | Discharge: 2013-11-22 | Disposition: A | Discharge status: Home / Self Care | Payer: Commercial Managed Care - HMO | Source: Ambulatory Visit | Attending: Internal Medicine | Admitting: Internal Medicine

## 2013-11-22 DIAGNOSIS — R0789 Other chest pain: Secondary | ICD-10-CM

## 2013-11-22 DIAGNOSIS — R0602 Shortness of breath: Secondary | ICD-10-CM

## 2013-11-30 ENCOUNTER — Encounter: Payer: Self-pay | Admitting: Internal Medicine

## 2013-12-13 ENCOUNTER — Encounter: Payer: Self-pay | Admitting: Internal Medicine

## 2013-12-15 ENCOUNTER — Other Ambulatory Visit: Payer: Self-pay | Admitting: *Deleted

## 2013-12-15 MED ORDER — TRAMADOL HCL 50 MG PO TABS: 50.0000 mg | ORAL_TABLET | Freq: Four times a day (QID) | ORAL | Status: DC | PRN

## 2013-12-15 NOTE — Telephone Encounter (Signed)
Ok to refill Tramadol, please advise.

## 2013-12-15 NOTE — Telephone Encounter (Signed)
Prescription faxed

## 2013-12-15 NOTE — Telephone Encounter (Signed)
Tramadol #120 with one refill ordered.  rx signed and on your desk.

## 2013-12-21 ENCOUNTER — Telehealth: Payer: Self-pay | Admitting: Internal Medicine

## 2013-12-21 NOTE — Telephone Encounter (Signed)
The patient has been scheduled with Dr. Leanna Sato clinic cardiology 5.7.15 @ 9:15/  [facility] referral faxed.

## 2013-12-22 DIAGNOSIS — E785 Hyperlipidemia, unspecified: Secondary | ICD-10-CM | POA: Insufficient documentation

## 2013-12-31 NOTE — Telephone Encounter (Signed)
Received fax from [place] back Auth# [id number] Treating Provider Dr. Isaias Cowman # of Visits 4 Start Date 5.7.15 End Date 8.5.15 CPT 99499 DX 786.05, 786.59

## 2014-01-03 ENCOUNTER — Other Ambulatory Visit: Payer: Self-pay | Admitting: *Deleted

## 2014-01-03 MED ORDER — METOPROLOL TARTRATE 100 MG PO TABS: 100.0000 mg | ORAL_TABLET | Freq: Two times a day (BID) | ORAL | Status: DC

## 2014-02-02 ENCOUNTER — Other Ambulatory Visit: Payer: Self-pay | Admitting: *Deleted

## 2014-02-02 MED ORDER — LOSARTAN POTASSIUM 100 MG PO TABS: 100.0000 mg | ORAL_TABLET | Freq: Every day | ORAL | Status: DC

## 2014-02-03 ENCOUNTER — Encounter: Payer: Medicare Other | Admitting: Internal Medicine

## 2014-02-09 ENCOUNTER — Other Ambulatory Visit (INDEPENDENT_AMBULATORY_CARE_PROVIDER_SITE_OTHER): Payer: Commercial Managed Care - HMO

## 2014-02-09 DIAGNOSIS — I2510 Atherosclerotic heart disease of native coronary artery without angina pectoris: Secondary | ICD-10-CM

## 2014-02-09 DIAGNOSIS — I10 Essential (primary) hypertension: Secondary | ICD-10-CM

## 2014-02-09 DIAGNOSIS — G4730 Sleep apnea, unspecified: Secondary | ICD-10-CM

## 2014-02-09 DIAGNOSIS — D649 Anemia, unspecified: Secondary | ICD-10-CM

## 2014-02-09 LAB — COMPREHENSIVE METABOLIC PANEL
ALBUMIN: 3.7 g/dL (ref 3.5–5.2)
ALK PHOS: 51 U/L (ref 39–117)
ALT: 15 U/L (ref 0–53)
AST: 17 U/L (ref 0–37)
BUN: 11 mg/dL (ref 6–23)
CHLORIDE: 99 meq/L (ref 96–112)
CO2: 26 mEq/L (ref 19–32)
Calcium: 9.6 mg/dL (ref 8.4–10.5)
Creatinine, Ser: 1.2 mg/dL (ref 0.4–1.5)
GFR: 65.8 mL/min (ref >60.00)
Glucose, Bld: 101 mg/dL — ABNORMAL HIGH (ref 70–99)
POTASSIUM: 4.9 meq/L (ref 3.5–5.1)
SODIUM: 131 meq/L — AB (ref 135–145)
TOTAL PROTEIN: 8.3 g/dL (ref 6.0–8.3)
Total Bilirubin: 0.6 mg/dL (ref 0.2–1.2)

## 2014-02-09 LAB — LIPID PANEL
CHOL/HDL RATIO: 5
Cholesterol: 157 mg/dL (ref 0–200)
HDL: 33.2 mg/dL — ABNORMAL LOW (ref >39.00)
LDL CALC: 110 mg/dL — AB (ref 0–99)
NONHDL: 123.8
Triglycerides: 68 mg/dL (ref 0.0–149.0)
VLDL: 13.6 mg/dL (ref 0.0–40.0)

## 2014-02-09 LAB — CBC WITH DIFFERENTIAL/PLATELET
BASOS ABS: 0.1 10*3/uL (ref 0.0–0.1)
Basophils Relative: 0.7 % (ref 0.0–3.0)
EOS ABS: 0.1 10*3/uL (ref 0.0–0.7)
Eosinophils Relative: 0.8 % (ref 0.0–5.0)
HCT: 38.5 % — ABNORMAL LOW (ref 39.0–52.0)
Hemoglobin: 13 g/dL (ref 13.0–17.0)
Lymphocytes Relative: 31.5 % (ref 12.0–46.0)
Lymphs Abs: 2.2 10*3/uL (ref 0.7–4.0)
MCHC: 33.8 g/dL (ref 30.0–36.0)
MCV: 102.1 fl — AB (ref 78.0–100.0)
MONO ABS: 0.8 10*3/uL (ref 0.1–1.0)
Monocytes Relative: 11.3 % (ref 3.0–12.0)
NEUTROS ABS: 4 10*3/uL (ref 1.4–7.7)
Neutrophils Relative %: 55.7 % (ref 43.0–77.0)
Platelets: 432 10*3/uL — ABNORMAL HIGH (ref 150.0–400.0)
RBC: 3.78 Mil/uL — AB (ref 4.22–5.81)
RDW: 14.6 % (ref 11.5–15.5)
WBC: 7.1 10*3/uL (ref 4.0–10.5)

## 2014-02-09 LAB — FERRITIN: Ferritin: 12.7 ng/mL — ABNORMAL LOW (ref 22.0–322.0)

## 2014-02-09 LAB — TSH: TSH: 1.37 u[IU]/mL (ref 0.35–4.50)

## 2014-02-10 ENCOUNTER — Encounter: Payer: Self-pay | Admitting: Internal Medicine

## 2014-02-11 ENCOUNTER — Other Ambulatory Visit: Payer: Self-pay | Admitting: *Deleted

## 2014-02-11 MED ORDER — TRAMADOL HCL 50 MG PO TABS: 50.0000 mg | ORAL_TABLET | Freq: Four times a day (QID) | ORAL | Status: DC | PRN

## 2014-02-11 NOTE — Telephone Encounter (Signed)
Rx faxed to pharmacy  

## 2014-02-11 NOTE — Telephone Encounter (Signed)
Refilled tramadol #120 with one refill.   

## 2014-02-11 NOTE — Telephone Encounter (Signed)
Ok refill? Last visit 11/18/13

## 2014-02-17 ENCOUNTER — Encounter: Payer: Self-pay | Admitting: Internal Medicine

## 2014-02-28 ENCOUNTER — Encounter: Payer: Self-pay | Admitting: Internal Medicine

## 2014-03-14 ENCOUNTER — Other Ambulatory Visit: Payer: Commercial Managed Care - HMO

## 2014-03-16 ENCOUNTER — Ambulatory Visit (INDEPENDENT_AMBULATORY_CARE_PROVIDER_SITE_OTHER): Payer: Commercial Managed Care - HMO | Admitting: Internal Medicine

## 2014-03-16 ENCOUNTER — Encounter: Payer: Self-pay | Admitting: Internal Medicine

## 2014-03-16 VITALS — BP 126/80 | HR 61 | Temp 98.4°F | Ht 71.0 in | Wt 197.5 lb

## 2014-03-16 DIAGNOSIS — D472 Monoclonal gammopathy: Secondary | ICD-10-CM

## 2014-03-16 DIAGNOSIS — E871 Hypo-osmolality and hyponatremia: Secondary | ICD-10-CM

## 2014-03-16 DIAGNOSIS — D649 Anemia, unspecified: Secondary | ICD-10-CM

## 2014-03-16 DIAGNOSIS — G8929 Other chronic pain: Secondary | ICD-10-CM

## 2014-03-16 DIAGNOSIS — M79606 Pain in leg, unspecified: Secondary | ICD-10-CM

## 2014-03-16 DIAGNOSIS — I10 Essential (primary) hypertension: Secondary | ICD-10-CM

## 2014-03-16 DIAGNOSIS — M549 Dorsalgia, unspecified: Secondary | ICD-10-CM

## 2014-03-16 DIAGNOSIS — IMO0002 Reserved for concepts with insufficient information to code with codable children: Secondary | ICD-10-CM

## 2014-03-16 DIAGNOSIS — I2510 Atherosclerotic heart disease of native coronary artery without angina pectoris: Secondary | ICD-10-CM

## 2014-03-16 DIAGNOSIS — R197 Diarrhea, unspecified: Secondary | ICD-10-CM

## 2014-03-16 DIAGNOSIS — K219 Gastro-esophageal reflux disease without esophagitis: Secondary | ICD-10-CM

## 2014-03-16 DIAGNOSIS — L98499 Non-pressure chronic ulcer of skin of other sites with unspecified severity: Secondary | ICD-10-CM

## 2014-03-16 DIAGNOSIS — G4730 Sleep apnea, unspecified: Secondary | ICD-10-CM

## 2014-03-16 DIAGNOSIS — E875 Hyperkalemia: Secondary | ICD-10-CM

## 2014-03-16 DIAGNOSIS — M79609 Pain in unspecified limb: Secondary | ICD-10-CM

## 2014-03-16 NOTE — Progress Notes (Signed)
Pre visit review using our clinic review tool, if applicable. No additional management support is needed unless otherwise documented below in the visit note. 

## 2014-03-20 ENCOUNTER — Encounter: Payer: Self-pay | Admitting: Internal Medicine

## 2014-03-20 DIAGNOSIS — M79606 Pain in leg, unspecified: Secondary | ICD-10-CM | POA: Insufficient documentation

## 2014-03-20 MED ORDER — GABAPENTIN 100 MG PO CAPS: ORAL_CAPSULE | ORAL | Status: DC

## 2014-03-20 MED ORDER — PANTOPRAZOLE SODIUM 40 MG PO TBEC
40.0000 mg | DELAYED_RELEASE_TABLET | Freq: Two times a day (BID) | ORAL | Status: DC
Start: 2014-03-20 — End: 2014-11-21

## 2014-03-20 MED ORDER — LOSARTAN POTASSIUM 100 MG PO TABS: 100.0000 mg | ORAL_TABLET | Freq: Every day | ORAL | Status: DC

## 2014-03-20 MED ORDER — METOPROLOL TARTRATE 100 MG PO TABS: 100.0000 mg | ORAL_TABLET | Freq: Two times a day (BID) | ORAL | Status: DC

## 2014-03-20 MED ORDER — AMLODIPINE BESYLATE 10 MG PO TABS: 10.0000 mg | ORAL_TABLET | Freq: Every day | ORAL | Status: DC

## 2014-03-20 NOTE — Assessment & Plan Note (Signed)
Confirmed with sleep study.   Try to tolerate CPAP.

## 2014-03-20 NOTE — Assessment & Plan Note (Signed)
Recent f/u EGD revealed ulcer - healed.

## 2014-03-20 NOTE — Assessment & Plan Note (Signed)
Colonoscopy as outlined.  Bowels still loose.  Request refill of cholestyramine - prescribed by Ebony Cargo.

## 2014-03-20 NOTE — Assessment & Plan Note (Signed)
Last check stable.  Follow.

## 2014-03-20 NOTE — Assessment & Plan Note (Signed)
Discussed compression hose.  Follow.

## 2014-03-20 NOTE — Assessment & Plan Note (Signed)
Has had extensive GI w/up previously.  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  Recently admitted with GI bleed.  On iron.   Will need to follow.  Will refer back to hematology for evaluation.

## 2014-03-20 NOTE — Assessment & Plan Note (Signed)
Blood pressure has been doing better.  138/78 on recheck today.  Will follow.  If starts to trend down, can decrease amlodipine back to 5mg  per day.   Only takes lasix prn.  Follow.  Follow metabolic panel.

## 2014-03-20 NOTE — Assessment & Plan Note (Signed)
Stable.  Has tramadol prn.

## 2014-03-20 NOTE — Assessment & Plan Note (Signed)
M spike has been relatively stable.  With the anemia (even prior to the bleed), back pain and persistent elevation, would like for hematology to reevaluate.  Was referred back to hematology.  Apparently did not make appt.  Reschedule.

## 2014-03-20 NOTE — Assessment & Plan Note (Signed)
Persistent problem.  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  Follow sodium levels.  Was found to have adrenal gland enlargement on CT - unchanged from 2007.  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  Follow.  Blood pressure has been under good control.  Last sodium stable - slightly improved.    Follow.

## 2014-03-20 NOTE — Assessment & Plan Note (Signed)
EGD as outlined.  On nexium.  Continues to follow up with GI.  Symptoms as outlined.

## 2014-03-20 NOTE — Progress Notes (Signed)
Subjective:    Patient ID: Troy Lopez, male    DOB: [birth year], 73 y.o.   MRN: [id number]  HPI 73 year old male with past history of hypertension, hypercholesterolemia, IBS and monoclonal gammopathy.  He comes in today to follow up on these issues as well as for a complete physical exam.    He was admitted 01/22/13 for GI bleed.  Was having increased weakness and black stool.   Was found to have ulcer on EGD.   Did not require transfusion.  On iron supplements (integra).  Still having stomach issues.  S/p recent f/u EGD and dilatation.  No significant swallowing issues.  Following with Dr Stephanie Acre.  He is eating.  Trying to remain off antiinflammatories.  Taking tylenol.   No chest pain or tightness.  No dizziness.  Off prozac.  Off imipramine.  Depression better.  We discussed the need to decrease his alcohol intake.  Has some leg aching.  We discussed wearing compression hose.  Still with soft stool.  Request refill on cholestyramine.  Prescribed by Ebony Cargo.        Past Medical History  Diagnosis Date  . Hypertension   . Hypercholesterolemia   . Irritable bowel syndrome   . GERD (gastroesophageal reflux disease)   . Colonic polyp   . Monoclonal gammopathy   . Hyperkalemia   . COPD (chronic obstructive pulmonary disease)   . Adrenal gland anomaly     enlargement  . Diverticulosis   . Depression   . Degenerative disc disease, lumbar   . Carpal tunnel syndrome     Current Outpatient Prescriptions on File Prior to Visit  Medication Sig Dispense Refill  . acetaminophen (TYLENOL) 325 MG tablet Take 650 mg by mouth every 6 (six) hours as needed for pain.      Marland Kitchen EPINEPHrine (EPI-PEN) 0.3 mg/0.3 mL SOAJ injection Inject 0.3 mLs (0.3 mg total) into the muscle once.  1 Device  2  . fluticasone (FLONASE) 50 MCG/ACT nasal spray Place 2 sprays into the nose daily.  16 g  5  . furosemide (LASIX) 20 MG tablet Take 1 tablet (20 mg total) by mouth daily as needed.  30 tablet  0  . tiotropium  (SPIRIVA HANDIHALER) 18 MCG inhalation capsule Place 1 capsule (18 mcg total) into inhaler and inhale daily.  30 capsule  2  . traMADol (ULTRAM) 50 MG tablet Take 1 tablet (50 mg total) by mouth every 6 (six) hours as needed.  120 tablet  1   No current facility-administered medications on file prior to visit.    Review of Systems No dizziness.  No headache.  No sinus or allergy symptoms.   No chest pain, tightness or palpitations.  No increased sob reported.  Some fatigue.   No vomiting.  Persistent diarrhea and bowel issues as outlined. Soft stool.  Request refill cholestyramine.   No urine change.  Discussed alcohol intake.  Discussed the need to quit/cut down.  Leg aching as outlined.         Objective:   Physical Exam  Filed Vitals:   03/16/14 1030  BP: 126/80  Pulse: 61  Temp: 98.4 F (27.46 C)   73 year old male in no acute distress.  HEENT:  Nares - clear.  Oropharynx - without lesions. NECK:  Supple.  Nontender.  No audible carotid bruit.  HEART:  Appears to be regular.   LUNGS:  No crackles or wheezing audible.  Respirations even and unlabored.   RADIAL PULSE:  Equal bilaterally.  ABDOMEN:  Soft.  Nontender.  Bowel sounds present and normal.  No audible abdominal bruit.  GU:  Normal descended testicles.  No palpable testicular nodules.   RECTAL:  Could not appreciate any palpable prostate nodules.  Heme negative.   EXTREMITIES:  No increased edema present.  DP pulses palpable and equal bilaterally.         Assessment & Plan:  PULMONARY.  Had quit smoking.  Restarted.  Need to quit.  Discussed with him today.  He plans to try to stop.  CXR 11/18/13 - no acute abnormality.    MSK.  Has seen Dr Carloyn Manner.  Recommended surgery.  Currently stable.  Follow.   INCREASED PSYCHOSOCIAL STRESSORS/DEPRESSION.  Off prozac.  Off imipramine.  Depression better.    HEALTH MAINTENANCE. Physical today.  Has had extensive GI w/up.  Seeing GI now.  Had recent colonoscopy.   PSA 08/03/13 - 1.15.      I spent 25 minutes with the patient and more than 50% of the time was spent in consultation regarding the above.

## 2014-03-20 NOTE — Assessment & Plan Note (Signed)
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  Continue aggressive risk factor modification.  Continues to follow up with Dr Saralyn Pilar.  Last EKG obtained and revealed SR with RBBB.  TWI in v1 and v2.  No acute ischemic changes.  Needs f/u with Dr Saralyn Pilar.

## 2014-04-12 ENCOUNTER — Ambulatory Visit: Payer: Self-pay | Admitting: Internal Medicine

## 2014-04-12 LAB — HEPATIC FUNCTION PANEL A (ARMC)
ALBUMIN: 3.3 g/dL — AB (ref 3.4–5.0)
ALT: 21 U/L
Alkaline Phosphatase: 67 U/L
BILIRUBIN TOTAL: 0.3 mg/dL (ref 0.2–1.0)
Bilirubin, Direct: 0.1 mg/dL (ref 0.00–0.20)
SGOT(AST): 14 U/L — ABNORMAL LOW (ref 15–37)
Total Protein: 8.9 g/dL — ABNORMAL HIGH (ref 6.4–8.2)

## 2014-04-12 LAB — CBC CANCER CENTER
Basophil #: 0.1 x10 3/mm (ref 0.0–0.1)
Basophil %: 1 %
EOS PCT: 0.7 %
Eosinophil #: 0.1 x10 3/mm (ref 0.0–0.7)
HCT: 39.9 % — AB (ref 40.0–52.0)
HGB: 13.5 g/dL (ref 13.0–18.0)
LYMPHS PCT: 30 %
Lymphocyte #: 2.4 x10 3/mm (ref 1.0–3.6)
MCH: 34.8 pg — AB (ref 26.0–34.0)
MCHC: 34 g/dL (ref 32.0–36.0)
MCV: 103 fL — ABNORMAL HIGH (ref 80–100)
Monocyte #: 0.9 x10 3/mm (ref 0.2–1.0)
Monocyte %: 12 %
NEUTROS ABS: 4.4 x10 3/mm (ref 1.4–6.5)
Neutrophil %: 56.3 %
PLATELETS: 383 x10 3/mm (ref 150–440)
RBC: 3.89 10*6/uL — ABNORMAL LOW (ref 4.40–5.90)
RDW: 13.9 % (ref 11.5–14.5)
WBC: 7.9 x10 3/mm (ref 3.8–10.6)

## 2014-04-12 LAB — CREATININE, SERUM
CREATININE: 1.36 mg/dL — AB (ref 0.60–1.30)
GFR CALC AF AMER: 60 — AB
GFR CALC NON AF AMER: 52 — AB

## 2014-04-12 LAB — LACTATE DEHYDROGENASE: LDH: 112 U/L (ref 85–241)

## 2014-04-12 LAB — URIC ACID: Uric Acid: 5.8 mg/dL (ref 3.5–7.2)

## 2014-04-15 LAB — KAPPA/LAMBDA FREE LIGHT CHAINS (ARMC)

## 2014-04-15 LAB — PROT IMMUNOELECTROPHORES(ARMC)

## 2014-04-19 ENCOUNTER — Ambulatory Visit: Payer: Self-pay | Admitting: Internal Medicine

## 2014-04-22 ENCOUNTER — Encounter: Payer: Self-pay | Admitting: Internal Medicine

## 2014-04-22 LAB — CREATININE, SERUM
CREATININE: 1.15 mg/dL (ref 0.60–1.30)
EGFR (African American): >60
EGFR (Non-African Amer.): >60

## 2014-04-24 MED ORDER — TRAMADOL HCL 50 MG PO TABS: 50.0000 mg | ORAL_TABLET | Freq: Four times a day (QID) | ORAL | Status: DC | PRN

## 2014-04-24 NOTE — Telephone Encounter (Signed)
rx called in for tramadol #120 with 1 refill.  Talked to [place].  Message sent to pt and pharmacist contacting pt.

## 2014-04-27 ENCOUNTER — Other Ambulatory Visit: Payer: Self-pay | Admitting: *Deleted

## 2014-04-27 MED ORDER — FLUTICASONE PROPIONATE 50 MCG/ACT NA SUSP: 2.0000 | Freq: Every day | NASAL | Status: DC

## 2014-04-27 MED ORDER — FUROSEMIDE 20 MG PO TABS: 20.0000 mg | ORAL_TABLET | Freq: Every day | ORAL | Status: DC | PRN

## 2014-05-19 ENCOUNTER — Ambulatory Visit: Payer: Self-pay | Admitting: Internal Medicine

## 2014-05-20 ENCOUNTER — Ambulatory Visit: Payer: Self-pay | Admitting: Family Medicine

## 2014-06-07 LAB — CBC CANCER CENTER
BASOS PCT: 1.1 %
Basophil #: 0.1 x10 3/mm (ref 0.0–0.1)
Eosinophil #: 0 x10 3/mm (ref 0.0–0.7)
Eosinophil %: 0.7 %
HCT: 41.1 % (ref 40.0–52.0)
HGB: 14.5 g/dL (ref 13.0–18.0)
LYMPHS ABS: 1.8 x10 3/mm (ref 1.0–3.6)
Lymphocyte %: 28.4 %
MCH: 35.8 pg — AB (ref 26.0–34.0)
MCHC: 35.3 g/dL (ref 32.0–36.0)
MCV: 102 fL — ABNORMAL HIGH (ref 80–100)
MONO ABS: 0.7 x10 3/mm (ref 0.2–1.0)
MONOS PCT: 11.4 %
Neutrophil #: 3.8 x10 3/mm (ref 1.4–6.5)
Neutrophil %: 58.4 %
PLATELETS: 410 x10 3/mm (ref 150–440)
RBC: 4.05 10*6/uL — ABNORMAL LOW (ref 4.40–5.90)
RDW: 13.6 % (ref 11.5–14.5)
WBC: 6.4 x10 3/mm (ref 3.8–10.6)

## 2014-06-07 LAB — IRON AND TIBC
IRON BIND. CAP.(TOTAL): 1297 ug/dL — AB (ref 250–450)
Iron Saturation: 24 %
Iron: 315 ug/dL — ABNORMAL HIGH (ref 65–175)
UNBOUND IRON-BIND. CAP.: 982 ug/dL

## 2014-06-07 LAB — FERRITIN: FERRITIN (ARMC): 16 ng/mL (ref 8–388)

## 2014-06-08 LAB — KAPPA/LAMBDA FREE LIGHT CHAINS (ARMC)

## 2014-06-15 ENCOUNTER — Other Ambulatory Visit (INDEPENDENT_AMBULATORY_CARE_PROVIDER_SITE_OTHER): Payer: Commercial Managed Care - HMO

## 2014-06-15 ENCOUNTER — Encounter: Payer: Self-pay | Admitting: Internal Medicine

## 2014-06-15 DIAGNOSIS — I10 Essential (primary) hypertension: Secondary | ICD-10-CM

## 2014-06-15 DIAGNOSIS — D472 Monoclonal gammopathy: Secondary | ICD-10-CM

## 2014-06-15 DIAGNOSIS — E871 Hypo-osmolality and hyponatremia: Secondary | ICD-10-CM

## 2014-06-15 DIAGNOSIS — E875 Hyperkalemia: Secondary | ICD-10-CM

## 2014-06-15 DIAGNOSIS — D649 Anemia, unspecified: Secondary | ICD-10-CM

## 2014-06-15 DIAGNOSIS — I2510 Atherosclerotic heart disease of native coronary artery without angina pectoris: Secondary | ICD-10-CM

## 2014-06-15 LAB — CBC WITH DIFFERENTIAL/PLATELET
BASOS ABS: 0.1 10*3/uL (ref 0.0–0.1)
BASOS PCT: 1.2 % (ref 0.0–3.0)
Eosinophils Absolute: 0.1 10*3/uL (ref 0.0–0.7)
Eosinophils Relative: 1.3 % (ref 0.0–5.0)
HEMATOCRIT: 40.5 % (ref 39.0–52.0)
HEMOGLOBIN: 13.6 g/dL (ref 13.0–17.0)
LYMPHS ABS: 2.7 10*3/uL (ref 0.7–4.0)
Lymphocytes Relative: 34 % (ref 12.0–46.0)
MCHC: 33.5 g/dL (ref 30.0–36.0)
MCV: 101.6 fl — ABNORMAL HIGH (ref 78.0–100.0)
MONOS PCT: 11.3 % (ref 3.0–12.0)
Monocytes Absolute: 0.9 10*3/uL (ref 0.1–1.0)
NEUTROS ABS: 4.2 10*3/uL (ref 1.4–7.7)
Neutrophils Relative %: 52.2 % (ref 43.0–77.0)
Platelets: 399 10*3/uL (ref 150.0–400.0)
RBC: 3.99 Mil/uL — ABNORMAL LOW (ref 4.22–5.81)
RDW: 14.3 % (ref 11.5–15.5)
WBC: 8 10*3/uL (ref 4.0–10.5)

## 2014-06-15 LAB — BASIC METABOLIC PANEL
BUN: 12 mg/dL (ref 6–23)
CHLORIDE: 94 meq/L — AB (ref 96–112)
CO2: 26 meq/L (ref 19–32)
CREATININE: 1.2 mg/dL (ref 0.4–1.5)
Calcium: 9.3 mg/dL (ref 8.4–10.5)
GFR: 63.21 mL/min (ref >60.00)
GLUCOSE: 87 mg/dL (ref 70–99)
Potassium: 4.9 mEq/L (ref 3.5–5.1)
Sodium: 129 mEq/L — ABNORMAL LOW (ref 135–145)

## 2014-06-15 LAB — HEPATIC FUNCTION PANEL
ALBUMIN: 3 g/dL — AB (ref 3.5–5.2)
ALT: 11 U/L (ref 0–53)
AST: 17 U/L (ref 0–37)
Alkaline Phosphatase: 50 U/L (ref 39–117)
Bilirubin, Direct: 0.1 mg/dL (ref 0.0–0.3)
TOTAL PROTEIN: 8.3 g/dL (ref 6.0–8.3)
Total Bilirubin: 0.6 mg/dL (ref 0.2–1.2)

## 2014-06-15 LAB — LIPID PANEL
Cholesterol: 156 mg/dL (ref 0–200)
HDL: 57.8 mg/dL (ref >39.00)
LDL Cholesterol: 83 mg/dL (ref 0–99)
NONHDL: 98.2
TRIGLYCERIDES: 76 mg/dL (ref 0.0–149.0)
Total CHOL/HDL Ratio: 3
VLDL: 15.2 mg/dL (ref 0.0–40.0)

## 2014-06-15 LAB — FERRITIN: Ferritin: 15.7 ng/mL — ABNORMAL LOW (ref 22.0–322.0)

## 2014-06-16 ENCOUNTER — Encounter: Payer: Self-pay | Admitting: Internal Medicine

## 2014-06-16 ENCOUNTER — Ambulatory Visit (INDEPENDENT_AMBULATORY_CARE_PROVIDER_SITE_OTHER): Payer: Commercial Managed Care - HMO | Admitting: Internal Medicine

## 2014-06-16 VITALS — BP 120/70 | HR 69 | Temp 98.3°F | Ht 71.0 in | Wt 197.5 lb

## 2014-06-16 DIAGNOSIS — M549 Dorsalgia, unspecified: Secondary | ICD-10-CM

## 2014-06-16 DIAGNOSIS — I10 Essential (primary) hypertension: Secondary | ICD-10-CM

## 2014-06-16 DIAGNOSIS — D472 Monoclonal gammopathy: Secondary | ICD-10-CM

## 2014-06-16 DIAGNOSIS — D649 Anemia, unspecified: Secondary | ICD-10-CM

## 2014-06-16 DIAGNOSIS — E871 Hypo-osmolality and hyponatremia: Secondary | ICD-10-CM

## 2014-06-16 DIAGNOSIS — R197 Diarrhea, unspecified: Secondary | ICD-10-CM

## 2014-06-16 DIAGNOSIS — K219 Gastro-esophageal reflux disease without esophagitis: Secondary | ICD-10-CM

## 2014-06-16 DIAGNOSIS — I2510 Atherosclerotic heart disease of native coronary artery without angina pectoris: Secondary | ICD-10-CM

## 2014-06-16 DIAGNOSIS — G8929 Other chronic pain: Secondary | ICD-10-CM

## 2014-06-16 DIAGNOSIS — G4730 Sleep apnea, unspecified: Secondary | ICD-10-CM

## 2014-06-16 DIAGNOSIS — E875 Hyperkalemia: Secondary | ICD-10-CM

## 2014-06-16 MED ORDER — AZELASTINE HCL 0.1 % NA SOLN: 1.0000 | Freq: Two times a day (BID) | NASAL | Status: DC

## 2014-06-16 NOTE — Progress Notes (Signed)
Pre visit review using our clinic review tool, if applicable. No additional management support is needed unless otherwise documented below in the visit note. 

## 2014-06-19 ENCOUNTER — Ambulatory Visit: Payer: Self-pay | Admitting: Internal Medicine

## 2014-06-21 ENCOUNTER — Other Ambulatory Visit: Payer: Self-pay | Admitting: *Deleted

## 2014-06-21 MED ORDER — FUROSEMIDE 20 MG PO TABS: 20.0000 mg | ORAL_TABLET | Freq: Every day | ORAL | Status: DC | PRN

## 2014-06-21 MED ORDER — TRAMADOL HCL 50 MG PO TABS: 50.0000 mg | ORAL_TABLET | Freq: Four times a day (QID) | ORAL | Status: DC | PRN

## 2014-06-21 NOTE — Addendum Note (Signed)
Addended by: Wynonia Lawman E on: 06/21/2014 03:54 PM   Modules accepted: Orders

## 2014-06-21 NOTE — Telephone Encounter (Signed)
Last visit 06/16/14, refill?

## 2014-06-22 MED ORDER — TRAMADOL HCL 50 MG PO TABS: 50.0000 mg | ORAL_TABLET | Freq: Four times a day (QID) | ORAL | Status: DC | PRN

## 2014-06-22 NOTE — Addendum Note (Signed)
Addended by: Wynonia Lawman E on: 06/22/2014 09:32 AM   Modules accepted: Orders

## 2014-06-22 NOTE — Telephone Encounter (Signed)
Faxed to pharmacy

## 2014-06-25 ENCOUNTER — Encounter: Payer: Self-pay | Admitting: Internal Medicine

## 2014-06-25 NOTE — Progress Notes (Signed)
Subjective:    Patient ID: Troy Lopez, male    DOB: 09-21-1940, 73 y.o.   MRN: [id number]  HPI 73 year old male with past history of hypertension, hypercholesterolemia, IBS and monoclonal gammopathy.  He comes in today for a scheduled follow up.  He was admitted 01/22/13 for GI bleed.  Was having increased weakness and black stool.   Was found to have ulcer on EGD.   Did not require transfusion.  On iron supplements (integra).  Still having stomach issues.  S/p recent f/u EGD and dilatation.  No significant swallowing issues.  Saw Dr Stephanie Acre.  He is eating.  Trying to remain off antiinflammatories.  Taking tylenol and tramadol.  No chest pain or tightness.  No dizziness.  Off prozac.  Off imipramine.  Depression better.  We again discussed the need to decrease his alcohol intake.  He recently saw Dr Cynda Acres.  Felt MGUS - stable.  Had low radiation CT.  Ok.  Need records.  Still with bowel changes.  Still with intermittent flares of soft stool and diarrhea.  Not using cholestyramine now. Having some drainage.  Persistent.  Nasal sprays not helping.       Past Medical History  Diagnosis Date  . Hypertension   . Hypercholesterolemia   . Irritable bowel syndrome   . GERD (gastroesophageal reflux disease)   . Colonic polyp   . Monoclonal gammopathy   . Hyperkalemia   . COPD (chronic obstructive pulmonary disease)   . Adrenal gland anomaly     enlargement  . Diverticulosis   . Depression   . Degenerative disc disease, lumbar   . Carpal tunnel syndrome     Current Outpatient Prescriptions on File Prior to Visit  Medication Sig Dispense Refill  . acetaminophen (TYLENOL) 325 MG tablet Take 650 mg by mouth every 6 (six) hours as needed for pain.    Marland Kitchen amLODipine (NORVASC) 10 MG tablet Take 1 tablet (10 mg total) by mouth daily. 90 tablet 1  . EPINEPHrine (EPI-PEN) 0.3 mg/0.3 mL SOAJ injection Inject 0.3 mLs (0.3 mg total) into the muscle once. 1 Device 2  . fluticasone (FLONASE) 50 MCG/ACT nasal  spray Place 2 sprays into both nostrils daily. 16 g 5  . gabapentin (NEURONTIN) 100 MG capsule Take two each morning, two each afternoon and three at bedtime 210 capsule 3  . losartan (COZAAR) 100 MG tablet Take 1 tablet (100 mg total) by mouth daily. 90 tablet 1  . metoprolol (LOPRESSOR) 100 MG tablet Take 1 tablet (100 mg total) by mouth 2 (two) times daily. 180 tablet 1  . pantoprazole (PROTONIX) 40 MG tablet Take 1 tablet (40 mg total) by mouth 2 (two) times daily. 180 tablet 1   No current facility-administered medications on file prior to visit.    Review of Systems No dizziness.  No headache reported.   No sinus pressure.  Some increased drainage.  No chest pain, tightness or palpitations.  No increased sob reported.  Some fatigue.   No vomiting.  Persistent diarrhea and bowel issues as outlined. Soft stool.  No urine change.  Discussed alcohol intake.  Discussed the need to quit/cut down.          Objective:   Physical Exam  Filed Vitals:   06/16/14 1104  BP: 120/70  Pulse: 69  Temp: 98.3 F (66.34 C)   73 year old male in no acute distress.  HEENT:  Nares - clear.  Oropharynx - without lesions. NECK:  Supple.  Nontender.  No audible carotid bruit.  HEART:  Appears to be regular.   LUNGS:  No crackles or wheezing audible.  Respirations even and unlabored.   RADIAL PULSE:  Equal bilaterally.  ABDOMEN:  Soft.  Nontender.  Bowel sounds present and normal.  No audible abdominal bruit.   EXTREMITIES:  No increased edema present.  DP pulses palpable and equal bilaterally.         Assessment & Plan:  PULMONARY.  Had quit smoking.  Restarted.  Need to quit.  Discussed with him today.  He plans to try to stop.  CXR 11/18/13 - no acute abnormality.  Had low radiation CT - negative.  Need results.   MSK.  Has seen Dr Carloyn Manner.  Recommended surgery.  Currently stable.  Follow.  Taking tramadol.   INCREASED PSYCHOSOCIAL STRESSORS/DEPRESSION.  Off prozac.  Off imipramine.  Depression better.     Coronary artery disease involving native coronary artery of native heart without angina pectoris Stable.  Continue risk factor modification.  Continue f/u with cardiology.  - Hepatic function panel; Future - Lipid panel; Future  Essential hypertension Blood pressure doing well.  Follow.  - Basic metabolic panel; Future  Gastroesophageal reflux disease without esophagitis Reflux controlled.   Anemia, unspecified anemia type Hgb stable and wnl last check.  Follow.   Monoclonal gammopathy Just evaluated by Dr Cynda Acres.  Stable.    Hyponatremia Stable.  Has been worked up by nephrology.  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  Follow sodium level.  Need to cut down alcohol.  Discussed today.   Hyperkalemia Stable.    Sleep apnea Has been unable to tolerate cpap.    Chronic back pain Stable.  Tramadol as needed.   Diarrhea Persistent intermittent flares.  Seeing GI.  Refer back for evaluation.   HEALTH MAINTENANCE. Physical 03/16/14.  Has had extensive GI w/up.  Seeing GI now.  Had recent colonoscopy.   PSA 08/03/13 - 1.15.     I spent 25 minutes with the patient and more than 50% of the time was spent in consultation regarding the above.

## 2014-07-01 ENCOUNTER — Encounter: Payer: Self-pay | Admitting: Internal Medicine

## 2014-08-25 ENCOUNTER — Ambulatory Visit: Payer: Self-pay | Admitting: Gastroenterology

## 2014-08-25 LAB — HM COLONOSCOPY

## 2014-08-26 ENCOUNTER — Telehealth: Payer: Self-pay | Admitting: *Deleted

## 2014-08-26 MED ORDER — TRAMADOL HCL 50 MG PO TABS: 50.0000 mg | ORAL_TABLET | Freq: Four times a day (QID) | ORAL | Status: DC | PRN

## 2014-08-26 NOTE — Telephone Encounter (Signed)
ok'd tramadol #120 with one refill.

## 2014-08-26 NOTE — Telephone Encounter (Signed)
Fax from pharmacy requesting Tramadol HCL 50 mg.  #120 one tablet every 6 hours prn.  Last OV 10.29.15, last refill 11.4.15.  Please advise refill

## 2014-09-02 ENCOUNTER — Encounter: Payer: Self-pay | Admitting: *Deleted

## 2014-09-14 ENCOUNTER — Encounter: Payer: Self-pay | Admitting: Internal Medicine

## 2014-09-16 ENCOUNTER — Encounter: Payer: Self-pay | Admitting: Internal Medicine

## 2014-09-16 DIAGNOSIS — K52831 Collagenous colitis: Secondary | ICD-10-CM | POA: Insufficient documentation

## 2014-09-28 ENCOUNTER — Other Ambulatory Visit: Payer: Self-pay | Admitting: *Deleted

## 2014-09-28 MED ORDER — AMLODIPINE BESYLATE 10 MG PO TABS: 10.0000 mg | ORAL_TABLET | Freq: Every day | ORAL | Status: DC

## 2014-09-28 MED ORDER — LOSARTAN POTASSIUM 100 MG PO TABS: 100.0000 mg | ORAL_TABLET | Freq: Every day | ORAL | Status: DC

## 2014-10-17 ENCOUNTER — Other Ambulatory Visit (INDEPENDENT_AMBULATORY_CARE_PROVIDER_SITE_OTHER): Payer: Commercial Managed Care - HMO

## 2014-10-17 DIAGNOSIS — I2510 Atherosclerotic heart disease of native coronary artery without angina pectoris: Secondary | ICD-10-CM

## 2014-10-17 DIAGNOSIS — I10 Essential (primary) hypertension: Secondary | ICD-10-CM

## 2014-10-17 LAB — BASIC METABOLIC PANEL
BUN: 24 mg/dL — ABNORMAL HIGH (ref 6–23)
CO2: 23 mEq/L (ref 19–32)
Calcium: 9.9 mg/dL (ref 8.4–10.5)
Chloride: 98 mEq/L (ref 96–112)
Creatinine, Ser: 1.38 mg/dL (ref 0.40–1.50)
GFR: 53.74 mL/min — AB (ref >60.00)
Glucose, Bld: 106 mg/dL — ABNORMAL HIGH (ref 70–99)
POTASSIUM: 4.7 meq/L (ref 3.5–5.1)
SODIUM: 128 meq/L — AB (ref 135–145)

## 2014-10-17 LAB — HEPATIC FUNCTION PANEL
ALBUMIN: 3.8 g/dL (ref 3.5–5.2)
ALT: 14 U/L (ref 0–53)
AST: 15 U/L (ref 0–37)
Alkaline Phosphatase: 58 U/L (ref 39–117)
BILIRUBIN TOTAL: 0.4 mg/dL (ref 0.2–1.2)
Bilirubin, Direct: 0.1 mg/dL (ref 0.0–0.3)
TOTAL PROTEIN: 9.1 g/dL — AB (ref 6.0–8.3)

## 2014-10-17 LAB — LIPID PANEL
Cholesterol: 163 mg/dL (ref 0–200)
HDL: 66 mg/dL
LDL Cholesterol: 80 mg/dL (ref 0–99)
NonHDL: 97
Total CHOL/HDL Ratio: 2
Triglycerides: 84 mg/dL (ref 0.0–149.0)
VLDL: 16.8 mg/dL (ref 0.0–40.0)

## 2014-10-19 ENCOUNTER — Encounter: Payer: Self-pay | Admitting: Internal Medicine

## 2014-10-19 ENCOUNTER — Ambulatory Visit (INDEPENDENT_AMBULATORY_CARE_PROVIDER_SITE_OTHER): Payer: Commercial Managed Care - HMO | Admitting: Internal Medicine

## 2014-10-19 VITALS — BP 120/80 | HR 61 | Temp 98.7°F | Ht 71.0 in | Wt 200.2 lb

## 2014-10-19 DIAGNOSIS — I2510 Atherosclerotic heart disease of native coronary artery without angina pectoris: Secondary | ICD-10-CM

## 2014-10-19 DIAGNOSIS — D472 Monoclonal gammopathy: Secondary | ICD-10-CM

## 2014-10-19 DIAGNOSIS — G4730 Sleep apnea, unspecified: Secondary | ICD-10-CM

## 2014-10-19 DIAGNOSIS — Z125 Encounter for screening for malignant neoplasm of prostate: Secondary | ICD-10-CM

## 2014-10-19 DIAGNOSIS — D649 Anemia, unspecified: Secondary | ICD-10-CM

## 2014-10-19 DIAGNOSIS — R197 Diarrhea, unspecified: Secondary | ICD-10-CM

## 2014-10-19 DIAGNOSIS — E871 Hypo-osmolality and hyponatremia: Secondary | ICD-10-CM

## 2014-10-19 DIAGNOSIS — G8929 Other chronic pain: Secondary | ICD-10-CM

## 2014-10-19 DIAGNOSIS — I10 Essential (primary) hypertension: Secondary | ICD-10-CM

## 2014-10-19 DIAGNOSIS — K5289 Other specified noninfective gastroenteritis and colitis: Secondary | ICD-10-CM

## 2014-10-19 DIAGNOSIS — K219 Gastro-esophageal reflux disease without esophagitis: Secondary | ICD-10-CM

## 2014-10-19 DIAGNOSIS — K52831 Collagenous colitis: Secondary | ICD-10-CM

## 2014-10-19 DIAGNOSIS — R51 Headache: Secondary | ICD-10-CM

## 2014-10-19 DIAGNOSIS — Z0000 Encounter for general adult medical examination without abnormal findings: Secondary | ICD-10-CM

## 2014-10-19 DIAGNOSIS — R519 Headache, unspecified: Secondary | ICD-10-CM

## 2014-10-19 DIAGNOSIS — M549 Dorsalgia, unspecified: Secondary | ICD-10-CM

## 2014-10-19 NOTE — Progress Notes (Signed)
Pre visit review using our clinic review tool, if applicable. No additional management support is needed unless otherwise documented below in the visit note. 

## 2014-10-23 ENCOUNTER — Telehealth: Payer: Self-pay | Admitting: Internal Medicine

## 2014-10-23 ENCOUNTER — Encounter: Payer: Self-pay | Admitting: Internal Medicine

## 2014-10-23 DIAGNOSIS — Z0000 Encounter for general adult medical examination without abnormal findings: Secondary | ICD-10-CM | POA: Insufficient documentation

## 2014-10-23 NOTE — Assessment & Plan Note (Signed)
Persistent headache.  Schedule MRI brain.  Further w/up pending results.

## 2014-10-23 NOTE — Assessment & Plan Note (Signed)
Recent colonoscopy as outlined in overview.  Still with persistent flares of his bowel.  Back on entocort.  Follow.

## 2014-10-23 NOTE — Assessment & Plan Note (Signed)
Recent EGD as outlined.  S/p dilatation.  On protonix.  Doing well.

## 2014-10-23 NOTE — Assessment & Plan Note (Signed)
Confirmed with sleep study.  Needs to use CPAP.

## 2014-10-23 NOTE — Assessment & Plan Note (Signed)
Has been persistent.  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  Last check stable at 128.  Follow.

## 2014-10-23 NOTE — Assessment & Plan Note (Signed)
Blood pressure has been doing better.  Follow.  Follow metabolic panel.  Recent Cr increased.  Recheck to confirm stable.

## 2014-10-23 NOTE — Assessment & Plan Note (Signed)
Has had extensive GI w/up as outlined.  Just saw hematology.  Follow cbc.

## 2014-10-23 NOTE — Assessment & Plan Note (Signed)
With collagenous colitis.  Back on entocort.  Follow.

## 2014-10-23 NOTE — Assessment & Plan Note (Signed)
Heart catheterization revealed 60% stenosis RCA.  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  Continue f/u with Dr Saralyn Pilar.

## 2014-10-23 NOTE — Progress Notes (Signed)
Patient ID: Troy Lopez, male   DOB: 1940-11-01, 74 y.o.   MRN: [id number]   Subjective:    Patient ID: Troy Lopez, male    DOB: 04/05/1941, 74 y.o.   MRN: [id number]  HPI  Patient here for a scheduled follow up.  Reports not feeling well.  Recently evaluated by GI.  Is s/p EGD and colonoscopy.  S/p dilatation - duodenal stricture.  Diagnosed with collagenous colitis.  Using entocort.  Helped initially.  Stopped.  Symptoms returned.  Recently started back on entocort.   Still with flares.  Is eating.  Trying to cut back on smoking.  Wearing nicotine patch.  Persistent headache.  Breathing stable.  Blood pressure has been doing well.     Past Medical History  Diagnosis Date  . Hypertension   . Hypercholesterolemia   . Irritable bowel syndrome   . GERD (gastroesophageal reflux disease)   . Colonic polyp   . Monoclonal gammopathy   . Hyperkalemia   . COPD (chronic obstructive pulmonary disease)   . Adrenal gland anomaly     enlargement  . Diverticulosis   . Depression   . Degenerative disc disease, lumbar   . Carpal tunnel syndrome     Current Outpatient Prescriptions on File Prior to Visit  Medication Sig Dispense Refill  . acetaminophen (TYLENOL) 325 MG tablet Take 650 mg by mouth every 6 (six) hours as needed for pain.    Marland Kitchen amLODipine (NORVASC) 10 MG tablet Take 1 tablet (10 mg total) by mouth daily. 90 tablet 1  . EPINEPHrine (EPI-PEN) 0.3 mg/0.3 mL SOAJ injection Inject 0.3 mLs (0.3 mg total) into the muscle once. 1 Device 2  . fluticasone (FLONASE) 50 MCG/ACT nasal spray Place 2 sprays into both nostrils daily. 16 g 5  . furosemide (LASIX) 20 MG tablet Take 1 tablet (20 mg total) by mouth daily as needed. 30 tablet 5  . gabapentin (NEURONTIN) 100 MG capsule Take two each morning, two each afternoon and three at bedtime 210 capsule 3  . losartan (COZAAR) 100 MG tablet Take 1 tablet (100 mg total) by mouth daily. 90 tablet 1  . metoprolol (LOPRESSOR) 100 MG tablet Take 1  tablet (100 mg total) by mouth 2 (two) times daily. 180 tablet 1  . pantoprazole (PROTONIX) 40 MG tablet Take 1 tablet (40 mg total) by mouth 2 (two) times daily. 180 tablet 1  . traMADol (ULTRAM) 50 MG tablet Take 1 tablet (50 mg total) by mouth every 6 (six) hours as needed. 120 tablet 1   No current facility-administered medications on file prior to visit.    Review of Systems  Constitutional: Positive for fatigue. Negative for activity change and unexpected weight change.  HENT: Negative for congestion and sinus pressure.   Respiratory: Negative for cough, chest tightness and shortness of breath.   Cardiovascular: Negative for chest pain, palpitations and leg swelling.  Gastrointestinal: Positive for diarrhea (persistent flares as outllined.  ). Negative for nausea and vomiting.  Genitourinary: Negative for dysuria and difficulty urinating.  Musculoskeletal: Positive for back pain. Negative for joint swelling.  Skin: Negative for color change and rash.  Neurological: Positive for headaches. Negative for syncope.  Psychiatric/Behavioral: Negative for behavioral problems.       Some increased stress related to his ongoing health issue.         Objective:    Physical Exam  Constitutional: He appears well-developed and well-nourished. No distress.  HENT:  Nose: Nose normal.  Mouth/Throat: Oropharynx is clear  and moist.  Neck: Neck supple. No thyromegaly present.  Cardiovascular: Normal rate and regular rhythm.   Pulmonary/Chest: Effort normal and breath sounds normal. No respiratory distress.  Abdominal: Soft. Bowel sounds are normal. There is no tenderness.  Musculoskeletal: He exhibits no edema or tenderness.  Lymphadenopathy:    He has no cervical adenopathy.  Skin: No rash noted. No erythema.  Psychiatric: His behavior is normal. Thought content normal.    BP 120/80 mmHg  Pulse 61  Temp(Src) 98.7 F (37.1 C) (Oral)  Ht 5\' 11"  (1.803 m)  Wt 200 lb 4 oz (90.833 kg)   BMI 27.94 kg/m2  SpO2 93% Wt Readings from Last 3 Encounters:  10/19/14 200 lb 4 oz (90.833 kg)  06/16/14 197 lb 8 oz (89.585 kg)  03/16/14 197 lb 8 oz (89.585 kg)     Lab Results  Component Value Date   WBC 8.0 06/15/2014   HGB 13.6 06/15/2014   HCT 40.5 06/15/2014   PLT 399.0 06/15/2014   GLUCOSE 106* 10/17/2014   CHOL 163 10/17/2014   TRIG 84.0 10/17/2014   HDL 66.00 10/17/2014   LDLCALC 80 10/17/2014   ALT 14 10/17/2014   AST 15 10/17/2014   NA 128* 10/17/2014   K 4.7 10/17/2014   CL 98 10/17/2014   CREATININE 1.38 10/17/2014   BUN 24* 10/17/2014   CO2 23 10/17/2014   TSH 1.37 02/09/2014   PSA 1.15 08/03/2013    Dg Chest 2 View  11/22/2013   CLINICAL DATA:  Cough and congestion.  EXAM: CHEST  2 VIEW  COMPARISON:  CT ABD-PELV W/O CM dated 04/30/2013; DG CHEST 2V dated 11/01/2005  FINDINGS: Mediastinum and hilar structures are normal. Stable elevation left hemidiaphragm. Lungs are clear. No pleural effusion or pneumothorax. Heart size stable. No acute osseous abnormality. Degenerative changes thoracic spine.  IMPRESSION: Stable chest, no acute abnormality.   Electronically Signed   By: Marcello Moores  Register   On: 11/22/2013 12:39       Assessment & Plan:   Problem List Items Addressed This Visit    Anemia    Has had extensive GI w/up as outlined.  Just saw hematology.  Follow cbc.        CAD (coronary artery disease)    Heart catheterization revealed 60% stenosis RCA.  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  Continue f/u with Dr Saralyn Pilar.       Chronic back pain    Stable.  On ultram.  Follow.        Relevant Medications   budesonide (ENTOCORT EC) 3 MG 24 hr capsule   Collagenous colitis    Recent colonoscopy as outlined in overview.  Still with persistent flares of his bowel.  Back on entocort.  Follow.        Diarrhea    With collagenous colitis.  Back on entocort.  Follow.        GERD (gastroesophageal reflux disease)    Recent EGD as outlined.  S/p dilatation.  On  protonix.  Doing well.        Headache    Persistent headache.  Schedule MRI brain.  Further w/up pending results.        Relevant Orders   MR Brain Wo Contrast   Health care maintenance    Physical 03/16/14.  Check PSA with next labs.  Just had colonoscopy 08/25/14.        Hypertension - Primary    Blood pressure has been doing better.  Follow.  Follow metabolic panel.  Recent Cr increased.  Recheck to confirm stable.        Hyponatremia    Has been persistent.  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  Last check stable at 128.  Follow.        Monoclonal gammopathy    Just saw hematology.  Obtain records.  Follow.        Relevant Orders   Basic metabolic panel   Sleep apnea    Confirmed with sleep study.  Needs to use CPAP.        Other Visit Diagnoses    Prostate cancer screening        Relevant Orders    PSA      I spent 25 minutes with the patient and more than 50% of the time was spent in consultation regarding the above.     Einar Pheasant, MD

## 2014-10-23 NOTE — Telephone Encounter (Signed)
Need last office note and labs from Dr Cynda Acres.  Was recently evaluated.  Never received info.

## 2014-10-23 NOTE — Assessment & Plan Note (Signed)
Physical 03/16/14.  Check PSA with next labs.  Just had colonoscopy 08/25/14.

## 2014-10-23 NOTE — Assessment & Plan Note (Signed)
Just saw hematology.  Obtain records.  Follow.

## 2014-10-23 NOTE — Assessment & Plan Note (Signed)
Stable.  On ultram.  Follow.

## 2014-10-24 NOTE — Telephone Encounter (Signed)
Record requested.

## 2014-10-27 NOTE — Telephone Encounter (Signed)
Office notes received & placed in green folder

## 2014-10-27 NOTE — Telephone Encounter (Signed)
Will review 

## 2014-10-28 ENCOUNTER — Telehealth: Payer: Self-pay | Admitting: *Deleted

## 2014-10-28 MED ORDER — TRAMADOL HCL 50 MG PO TABS: 50.0000 mg | ORAL_TABLET | Freq: Four times a day (QID) | ORAL | Status: DC | PRN

## 2014-10-28 NOTE — Telephone Encounter (Signed)
Fax from pharmacy requesting Tramdol HCL 50mg .  Last refill 2.9.16.  Last OV 3.2.16.  Please advise refill

## 2014-10-28 NOTE — Telephone Encounter (Signed)
rx faxed

## 2014-10-28 NOTE — Telephone Encounter (Signed)
Refilled tramadol #120 with one refill.

## 2014-11-09 ENCOUNTER — Other Ambulatory Visit (INDEPENDENT_AMBULATORY_CARE_PROVIDER_SITE_OTHER): Payer: Commercial Managed Care - HMO

## 2014-11-09 DIAGNOSIS — Z125 Encounter for screening for malignant neoplasm of prostate: Secondary | ICD-10-CM

## 2014-11-09 DIAGNOSIS — R51 Headache: Secondary | ICD-10-CM

## 2014-11-09 DIAGNOSIS — D472 Monoclonal gammopathy: Secondary | ICD-10-CM

## 2014-11-09 DIAGNOSIS — R519 Headache, unspecified: Secondary | ICD-10-CM

## 2014-11-09 LAB — BASIC METABOLIC PANEL
BUN: 17 mg/dL (ref 6–23)
CALCIUM: 9.9 mg/dL (ref 8.4–10.5)
CO2: 27 mEq/L (ref 19–32)
CREATININE: 1.11 mg/dL (ref 0.40–1.50)
Chloride: 97 mEq/L (ref 96–112)
GFR: 69.08 mL/min (ref >60.00)
GLUCOSE: 85 mg/dL (ref 70–99)
Potassium: 4.8 mEq/L (ref 3.5–5.1)
Sodium: 129 mEq/L — ABNORMAL LOW (ref 135–145)

## 2014-11-09 LAB — PSA: PSA: 1.24 ng/mL (ref 0.10–4.00)

## 2014-11-10 ENCOUNTER — Encounter: Payer: Self-pay | Admitting: Internal Medicine

## 2014-11-10 ENCOUNTER — Ambulatory Visit: Payer: Self-pay | Admitting: Internal Medicine

## 2014-11-13 ENCOUNTER — Telehealth: Payer: Self-pay | Admitting: Internal Medicine

## 2014-11-13 DIAGNOSIS — R51 Headache: Principal | ICD-10-CM

## 2014-11-13 DIAGNOSIS — R519 Headache, unspecified: Secondary | ICD-10-CM

## 2014-11-13 NOTE — Telephone Encounter (Signed)
Pt notified of MRI results via mychart.  

## 2014-11-15 NOTE — Telephone Encounter (Signed)
Order placed for referral to neurology 

## 2014-11-15 NOTE — Addendum Note (Signed)
Addended by: Alisa Graff on: 11/15/2014 04:34 AM   Modules accepted: Orders

## 2014-11-21 ENCOUNTER — Other Ambulatory Visit: Payer: Self-pay | Admitting: *Deleted

## 2014-11-21 MED ORDER — PANTOPRAZOLE SODIUM 40 MG PO TBEC: 40.0000 mg | DELAYED_RELEASE_TABLET | Freq: Two times a day (BID) | ORAL | Status: DC

## 2014-12-05 ENCOUNTER — Encounter: Payer: Self-pay | Admitting: Internal Medicine

## 2014-12-05 DIAGNOSIS — Z0000 Encounter for general adult medical examination without abnormal findings: Secondary | ICD-10-CM

## 2014-12-05 NOTE — Telephone Encounter (Signed)
Order placed for opthalmology referral 

## 2014-12-05 NOTE — Telephone Encounter (Signed)
Please advise 

## 2014-12-09 NOTE — Consult Note (Signed)
Pt with melena today.  he was very hungry and is eating a mechanical soft diet.  Will make NPO after midnight.  He has some problems with DOE recently.  Hx of 60% blockage in his heart, uncertain where.  Will do EGD tomorrow and repeat labs in morning.  Electronic Signatures: Manya Silvas (MD)  (Signed on 06-Jun-14 16:55)  Authored  Last Updated: 06-Jun-14 16:55 by Manya Silvas (MD)

## 2014-12-09 NOTE — Discharge Summary (Signed)
PATIENT NAME:  Troy Lopez, Troy Lopez MR#:  [id number] DATE OF BIRTH:  [birth year]  DATE OF ADMISSION:  01/22/2013 DATE OF DISCHARGE:  01/24/2013  DISCHARGE DIAGNOSES:  Peptic ulcer disease, hypertension, electrolyte imbalance.   CONDITION ON DISCHARGE:  Stable.   CODE STATUS:  FULL CODE.   DISCHARGE MEDICATIONS:  1.  Clonidine 0.1 mg oral tablet 2 times daily.  2.  Imipramine 50 mg oral tablet once a day.  3.  Losartan 100 mg oral tablet once a day.  4.  Fluticasone 1 spray nasally once a day.  5.  Gabapentin 100 mg 2 capsules once a day.  6.  Amlodipine 10 mg once a day.  7.  Pantoprazole 40 mg delayed-release 2 times a day.  8.  Metoprolol 100 mg 2 times a day.  9.  Ferrous sulfate 325 mg oral tablet 3 times a day.  10.  Docusate sodium 100 mg capsule 2 times a day as needed for constipation.   DIET ON DISCHARGE:  Low sodium.   DIET CONSISTENCY:  Regular.   ACTIVITY:  As tolerated.   TIMEFRAME TO FOLLOW-UP:  Within 1 to 2 weeks with Dr. Vira Agar.     HISTORY OF PRESENT ILLNESS:  This is a 74 year old Caucasian gentleman with history of chronic back pain, takes ibuprofen on a regular basis, hypertension and acid reflux disease, admitted from Dr. Myrna Blazer office for evaluation of melena.  He started having dark-colored stool the previous day and on the day of presentation it was dark color, tarry-colored stool.  He went to Dr. Myrna Blazer office.  His hemoglobin has dropped from 12.7 to 10.7, and so he was admitted for further evaluation and management.  He was taking 800 mg ibuprofen every day at least twice, has taken 1200 mg ibuprofen maximum during the daytime.  A long history of excessive alcohol use and he also drinks 4 to 5 beers every day.   HOSPITAL COURSE AND STAY:  For melena and drop in hemoglobin, upper GI endoscopy was done and it showed nonobstructive , medium amount of food in stomach, gastric ulcer with clean base, retained food in duodenum. started on liquid diet and he tolerated it  nicely and he was able to tolerate a regular diet next and so discharged home with advice to follow in GI clinic.   OTHER MEDICAL ISSUES ADDRESSED IN THE HOSPITAL STAY:  1.  Hypertension.  We continued metoprolol, losartan, clonidine and amlodipine, as he was taking at home.  2.  Chronic obstructive pulmonary disease.  The patient was advised for smoking cessation.  He was using oxygen as needed basis and nebulizer.  We continued at hospital the same therapy.  No exacerbation symptoms.  3.  Hyponatremia and hyperkalemia.  The patient was advised to have alcohol cessation and with IV fluid it remained stable.   IMPORTANT LABORATORY RESULTS IN THE HOSPITAL:  On presentation, APTT was 35.2.  WBC count 8.5, hemoglobin 9.9, platelet count 346, sodium was 130, potassium was 4.4, creatine 0.88 on presentation.   Hemoglobin remained stable at 10.1 the next day on follow-up.   Total time spent on discharge 35 minutes.    ____________________________ Ceasar Lund Anselm Jungling, MD vgv:ea D: 01/27/2013 22:40:22 ET T: 01/27/2013 23:37:39 ET JOB#: [id number]  cc: Ceasar Lund. Anselm Jungling, MD, <Dictator> Manya Silvas, MD Vaughan Basta MD ELECTRONICALLY SIGNED 02/01/2013 22:17

## 2014-12-09 NOTE — Consult Note (Signed)
Pt seen and examined. Please see Dawn Harrison's notes. Increasing EtOH use recently with chronic ibuprofen use. Melena x 2 days with drop in hgb. Protonix iv. NPO after MN for EGD in AM with Dr. Vira Agar. Discussed with Dr. Vira Agar. Thanks.  Electronic Signatures: Verdie Shire (MD)  (Signed on 06-Jun-14 16:31)  Authored  Last Updated: 06-Jun-14 16:31 by Verdie Shire (MD)

## 2014-12-09 NOTE — Consult Note (Signed)
PATIENT NAME:  Troy Lopez, Troy Lopez MR#:  [id number] DATE OF BIRTH:  02-06-1941  DATE OF CONSULTATION:  01/22/2013  REFERRING PHYSICIAN:  Max Sane, MD CONSULTING PHYSICIAN:  Verdie Shire, MD / Payton Emerald, NP  PRIMARY CARE PHYSICIAN:  Einar Pheasant, MD  HISTORY OF PRESENT ILLNESS: Mr. Googe is a 74 year old Caucasian gentleman who presented to our office for the chief complaint of melena. He was seen in an acute care clinic, with [facility] yesterday by Dr. Briscoe Deutscher. He presented with less than a 24-hour history of melena at that time. States that for the past couple of weeks he had not been feeling well, no energy. Yesterday morning had 1 occurrence of black-colored stool associated with shortness of breath, hard to get his breath at times for the past 2 to 3 weeks. He is well known to me under the care of myself for IBS over the past several years. He was self-medicating with [place] for several years for depression and anxiety. He was weaned off the [place] 6 months ago and placed on imipramine 50 mg at night. His GI symptoms had improved up until the last couple of days. He continued to experience constipation occasionally or loose stool, but again not to the severity which it was in the past. This morning was last occurrence of black, tarry sticky stool, 1 occurrence yesterday, 1 today so far.  Difficult to cleanse himself, he states. No associated abdominal pain. Positive for nausea. No vomiting. No bright red blood per rectum. Appetite has been decreased. He was taking omeprazole 20 mg on a regular basis and when he was seen by Dr. Juleen China yesterday was changed to 40 mg once a day and sucralfate was added, 1 gram 4 times daily. He has had 3 doses of sucralfate thus far.    Dr. Juleen China had changed him to Nexium 40 mg once a day, but he has not taken it thus far. Positive for lightheadedness. He has been taking ibuprofen 800 mg at least once a day. States that he on average at  times will take it 2 to 3 times a day. Yesterday he took a total of 1200 mg ibuprofen, 800 mg thus far today.  Several weeks ago, he did start drinking back to an excess of 4 to 5 beers a day. He has a long history of excessive alcohol use. Self-management for anxiety and depression.   PAST MEDICAL HISTORY: Hypertension, hypercholesteremia, irritable bowel, degenerative disk disease, reflux, history of colonic polyps, monoclonal gammopathy/Waldenstrom's syndrome, hyperkalemia, COPD,  adrenal gland enlargement with serial CT scans being done, diverticulosis, depression, anxiety and alcohol abuse.   PAST SURGICAL HISTORY:  Bunionectomy right foot in 89, hemorrhoidectomy and cholecystectomy in 2007.   HOME MEDICATIONS: 1.  Metoprolol 100 mg 1 tablet twice a day. 2.  Gabapentin 100 mg 2 capsules in the morning.  3.  Ibuprofen 200 mg tablets 2 to 4 tablets 4 times a day as needed. 4.  Metamucil 2 capsules once a day. 5.  Losartan 100 mg once a day. 6.  Clonidine 0.1 mg/1 mg twice a day. 7.  Norvasc 10 mg once a day.  8.  Fluconazole inhaler at bedtime. 9.  Imipramine 50 mg once a day. 10.  Nexium 40 mg once a day. 11.  Sucralfate 1 gram 10 mL 1 to 2 tsp 4 times a day.  ALLERGIES: IV CONTRAST.   FAMILY HISTORY: No family history of colon cancer, other GI neoplasms or colonic polyps.  SOCIAL HISTORY: Continued tobacco use. Alcohol 4 to 6 beers daily for years.  Increased, excess recently as stated in history.   REVIEW OF SYSTEMS: GENERAL: Denies any fevers. Significant for excessive fatigue.  HEENT: Denies any chronic eye redness, iritis, oral ulcers or hoarseness.  CARDIOVASCULAR: No chest pain. Significant for shortness of breath. No syncopal episodes. Significant for lightheadedness.  PULMONARY: No coughing, no hemoptysis or wheezing.  GASTROENTEROLOGY: See HPI. GENITOURINARY: Denies any dysuria, frequency, hematuria, nocturia.  MUSCULOSKELETAL: Denies any arthralgias or myalgias other  than what he normally experiences.  Denies exacerbation. SKIN:  No rashes. No lesions, jaundice.  PSYCH: Significant for depression and anxiety.  HEME/LYMPH: No lymphadenopathy. Significant for easy bruising and bleeding.  EXTREMITIES: No cyanosis or unusual edema.   PHYSICAL EXAMINATION:  VITAL SIGNS:  Per office visit assessment today.  Weight is 199, blood pressure 126/83, temperature 97.9 and pulse 77. HEENT: Normocephalic, atraumatic. Pupils equal and reactive to light. Conjunctivae clear. Sclerae anicteric.  NECK: Supple. Trachea midline. No lymphadenopathy or thyromegaly.  LUNGS:  Symmetric rise and fall of chest.  ABDOMEN: Soft, nondistended. Bowel sounds in 4 quadrants. No bruits. No masses.  RECTAL: External: Black-colored, sticky feces. Grossly heme positive. No evidence of bright red blood. No masses. Digital: No masses.  EXTREMITIES:  Moving all 4 extremities. No contractures. No clubbing. No edema.  PSYCH: Alert and oriented x 4. Memory grossly intact. Appropriate affect and mood.   LABORATORY STUDIES: From our office visit today:  RBC is 3.3, hemoglobin is 10.7, hematocrit is 31.5, MCH is 32.4, platelet volume is 7.9, otherwise within normal limits. PT 9.9 with an INR of 0.9. Comprehensive metabolic panel within normal limits, except total bilirubin is 0.2, chloride is 95 and sodium is 124.   IMPRESSION: Anemia, upper gastrointestinal bleed, melena, heme-positive stool, alcohol abuse.   PLAN: The patient was seen in our office today in consultation with Dr. Verdie Shire. The patient was directly admitted from our office under Dr. Trena Platt service. Clear liquid diet today.  N.p.o. after midnight. Dr. Verdie Shire has spoken with Dr. Gaylyn Cheers who will be covering for our service this weekend. Goal is to proceed with an upper endoscopy tomorrow to allow direct luminal evaluation of upper gastrointestinal tract to assess for source of blood loss. Order placed. Recommend PPI therapy such as  Protonix drip.  We will continue to monitor laboratory studies. To hold NSAIDs or any form of aspirin at this time.  Alcohol withdraw protocol should implemented.   These services provided by Payton Emerald, MS, APRN, [facility], FNP under collaborative agreement with Dr. Verdie Shire. ____________________________ Payton Emerald, NP dsh:sb D: 01/22/2013 13:25:48 ET T: 01/22/2013 15:04:57 ET JOB#: [id number]  cc: Payton Emerald, NP, <Dictator> Payton Emerald MD ELECTRONICALLY SIGNED 01/22/2013 15:45

## 2014-12-09 NOTE — H&P (Signed)
PATIENT NAME:  Troy Lopez, Troy Lopez MR#:  [id number] DATE OF BIRTH:  1940-12-02  DATE OF ADMISSION:  01/22/2013  PRIMARY CARE PHYSICIAN:  Einar Pheasant, MD   GASTROINTESTINAL:  Verdie Shire, MD   CHIEF COMPLAINT:  Dark tarry stools for two days.   HISTORY OF PRESENT ILLNESS: The patient is a pleasant 74 year old Caucasian gentleman with history of chronic back pain, who takes ibuprofen on a regular basis, hypertension and acid reflux, is admitted from Dr. Myrna Blazer office for evaluation of melena. The patient started having dark-colored stools yesterday. Today was dark, tarry stools. He went to Dr. Myrna Blazer office.  His  hemoglobin had dropped from 12.7 to 10.7. He is being admitted for further evaluation and management. The patient reports he has been taking 800 mg ibuprofen every day at least, at times, he twice.  Two to 3 times lately, he has taken about 1200 mg ibuprofen max during the daytime. He has long history of excessive alcohol use. He drinks about 4 to 5 beers every day. The patient is being admitted for further evaluation on his melena and anemia.   PAST MEDICAL HISTORY: 1. Hypertension.  2. Hypercholesterolemia.  3. Irritable bowel syndrome.  4. Degenerative disk disease. 5. Acid reflux.  6. Monoclonal gammopathy.  7. History of colon polyps on previous colonoscopy.  8. Chronic obstructive pulmonary disease.  9. Adrenal gland enlargement. Serial CT is being performed for follow-up.  10. History of diverticulosis.  11. Depression/anxiety.   PAST SURGICAL HISTORY: 1. Right foot bunionectomy in 1989. 2.  Status post hemorrhoidectomy.  3. Cholecystectomy in 2007.    ALLERGIES: IV CONTRAST.   FAMILY HISTORY: Positive for hypertension. No history of colon cancer or any GI neoplasm.   SOCIAL HISTORY: Married, lives at home with wife.  The patient states he quit smoking about a month ago and continues to drink 4 to 6 beers every day.   REVIEW OF SYSTEMS:   CONSTITUTIONAL: Positive for  fatigue, weakness. No fever.   EYES: No blurred or double vision. No glaucoma or cataracts.   ENT: No tinnitus, ear pain, hearing loss.   RESPIRATORY: No cough, wheeze, hemoptysis or respiratory distress.   CARDIOVASCULAR: No chest pain, orthopnea, edema or arrhythmia.   GASTROINTESTINAL: No nausea, vomiting, diarrhea or abdominal pain. Positive for GERD.  GENITOURINARY: No dysuria or hematuria.   ENDOCRINE: No polyuria, nocturia or thyroid problems.   HEMATOLOGY: Positive for anemia. No easy bruising or bleeding.   SKIN: No acne or rash.   MUSCULOSKELETAL: Positive for arthritis.   NEUROLOGIC: No CVA or transient ischemic attack, vertigo or ataxia.   PSYCHIATRIC: No anxiety or depression. All other systems reviewed and negative.   PHYSICAL EXAMINATION: GENERAL: The patient is awake, alert, oriented x 3. He is afebrile. Pulse is 70, blood pressure is 134/84, sats are 98% on room air.   HEENT: Atraumatic, normocephalic. Pupils are equal, round and reactive to light and accommodation. EOM intact. Oral mucosa is moist.   NECK: Supple. No JVD. No carotid bruit.   LUNGS: Clear to auscultation bilaterally. No rales, rhonchi, respiratory distress or labored breathing.   HEART: Both the heart sounds are normal. Rate and rhythm is regular. PMI not lateralized. Chest is nontender.   EXTREMITIES: Good pedal pulses, good femoral pulses. No lower extremity edema.   ABDOMEN: Soft, benign, nontender. No organomegaly. Positive bowel sounds.   NEUROLOGIC: Grossly intact cranial nerves II through XII. No motor or sensory deficit.   PSYCHIATRIC: The patient is awake, alert, oriented x  3.   SKIN: Warm and dry.   LABORATORY DATA:  Labs done at [facility] on 01/22/2013, hemoglobin is 10.7 and 31.5. White count is 8.4, platelet count 395. Comprehensive metabolic panel, calcium 50.0, chloride is 91, sodium 123. Potassium is 5.4. Total protein 7.6. INR is 0.9.   ASSESSMENT: A 74 year old  patient with history of hypertension, chronic back pain, history of chronic obstructive pulmonary disease and monoclonal gammopathy, comes from Dr. Myrna Blazer office with melanotic stool and hemoglobin down to 10.7. He has being taking ibuprofen lately more due to his chronic back pain. He is being admitted with:  1. Melena.  The patient will be on soft diet. Will give intravenous Protonix b.i.d.  2. The patient was also started on some IV fluids.  3. Type and screen and transfuse as needed.  4. Gastrointestinal consultation with Dr. Candace Cruise.  We will monitor hemoglobin closely.  5. Hypertension. Continue metoprolol, losartan, clonidine and amlodipine.  6. Acid reflux, on intravenous Protonix b.i.d.  7. Chronic obstructive pulmonary disease. The patient advised smoking cessation. He reports he quit about a month ago, p.r.n. oxygen, p.r.n. nebulizers.  8. Hyponatremia/hyperkalemia appears to be due to mild dehydration and worsens due to gastrointestinal bleed and excessive intake of alcohol. The patient is advised on alcohol cessation. We will give IV fluids. Monitor counts closely. Consider a nephrology consultation if needed. Watch for potassium after intravenous hydration; in case it is still up, then give Kayexalate.  9. Deep vein thrombosis prophylaxis.  Thromboembolic deterrents, and the patient is ambulatory. No antiplatelet secondary to the patient being evaluated for gastrointestinal bleed.  10. Further workup according to the patient's clinical course. Hospital admission plan was discussed with the patient and the patient's wife.   TIME SPENT: 50 minutes.    ____________________________ Hart Rochester Posey Pronto, MD sap:rw D: 01/22/2013 15:31:34 ET T: 01/22/2013 15:46:37 ET JOB#: [id number]  cc: Alexandros Ewan A. Posey Pronto, MD, <Dictator> Einar Pheasant, MD Lupita Dawn. Candace Cruise, MD  Ilda Basset MD ELECTRONICALLY SIGNED 01/30/2013 14:34

## 2014-12-09 NOTE — Consult Note (Signed)
Brief Consult Note: Diagnosis: Anemia, melena and heme positive stool.  Upper GI bleed. Alcohol abuse.   Discussed with Attending MD.   Comments: Patient's presentation was discussed with Dr. Verdie Shire.  Patient was seen in our office today and directly admitted to [facility].  Patient will require PPI therapy. Order placed for Protonix 40 mg po IV bid.  Suggest Protonix gtt.  Clear liquid diet today and NPO after midnight.  EGD will be scheduled and performed by Dr. Gaylyn Cheers tomorrow. Dr. Vira Agar is covering for our service this weekend.  No NSAID therapy.  Will monitor labs.  Transfuse as necessary..  Electronic Signatures: Payton Emerald (NP)  (Signed 06-Jun-14 13:38)  Authored: Brief Consult Note   Last Updated: 06-Jun-14 13:38 by Payton Emerald (NP)

## 2014-12-09 NOTE — Consult Note (Signed)
CC: pyloric channel ulcer.  I discussed EGD findings with patient, no current complaints,  discussed not to take any NSAID and to decrease alcohol consumption as much as possible, take PPI for 2 months at least.  No pain, no vomiting,  can go home today and follow up in office with Dawn and Dr. Candace Cruise.  Electronic Signatures: Manya Silvas (MD)  (Signed on 08-Jun-14 09:37)  Authored  Last Updated: 08-Jun-14 09:37 by Manya Silvas (MD)

## 2014-12-12 LAB — SURGICAL PATHOLOGY

## 2015-01-29 ENCOUNTER — Encounter: Payer: Self-pay | Admitting: Internal Medicine

## 2015-02-21 ENCOUNTER — Encounter: Payer: Self-pay | Admitting: Internal Medicine

## 2015-02-21 ENCOUNTER — Ambulatory Visit (INDEPENDENT_AMBULATORY_CARE_PROVIDER_SITE_OTHER): Payer: Commercial Managed Care - HMO | Admitting: Internal Medicine

## 2015-02-21 VITALS — BP 120/70 | HR 57 | Temp 98.5°F | Ht 71.0 in | Wt 198.5 lb

## 2015-02-21 DIAGNOSIS — D649 Anemia, unspecified: Secondary | ICD-10-CM

## 2015-02-21 DIAGNOSIS — K5289 Other specified noninfective gastroenteritis and colitis: Secondary | ICD-10-CM

## 2015-02-21 DIAGNOSIS — I10 Essential (primary) hypertension: Secondary | ICD-10-CM

## 2015-02-21 DIAGNOSIS — Z0000 Encounter for general adult medical examination without abnormal findings: Secondary | ICD-10-CM

## 2015-02-21 DIAGNOSIS — K52831 Collagenous colitis: Secondary | ICD-10-CM

## 2015-02-21 DIAGNOSIS — K219 Gastro-esophageal reflux disease without esophagitis: Secondary | ICD-10-CM

## 2015-02-21 DIAGNOSIS — G8929 Other chronic pain: Secondary | ICD-10-CM

## 2015-02-21 DIAGNOSIS — D472 Monoclonal gammopathy: Secondary | ICD-10-CM

## 2015-02-21 DIAGNOSIS — R51 Headache: Secondary | ICD-10-CM

## 2015-02-21 DIAGNOSIS — M7022 Olecranon bursitis, left elbow: Secondary | ICD-10-CM

## 2015-02-21 DIAGNOSIS — M549 Dorsalgia, unspecified: Secondary | ICD-10-CM

## 2015-02-21 DIAGNOSIS — R197 Diarrhea, unspecified: Secondary | ICD-10-CM | POA: Diagnosis not present

## 2015-02-21 DIAGNOSIS — I2510 Atherosclerotic heart disease of native coronary artery without angina pectoris: Secondary | ICD-10-CM

## 2015-02-21 DIAGNOSIS — R519 Headache, unspecified: Secondary | ICD-10-CM

## 2015-02-21 DIAGNOSIS — E871 Hypo-osmolality and hyponatremia: Secondary | ICD-10-CM

## 2015-02-21 MED ORDER — AMLODIPINE BESYLATE 5 MG PO TABS: 5.0000 mg | ORAL_TABLET | Freq: Every day | ORAL | Status: DC

## 2015-02-21 NOTE — Progress Notes (Signed)
Pre visit review using our clinic review tool, if applicable. No additional management support is needed unless otherwise documented below in the visit note. 

## 2015-02-21 NOTE — Progress Notes (Signed)
Patient ID: Troy Lopez, male   DOB: 1940/11/01, 74 y.o.   MRN: [id number]   Subjective:    Patient ID: Troy Lopez, male    DOB: 27-Apr-1941, 74 y.o.   MRN: [id number]  HPI  Patient here for a scheduled follow up.  Has been dealing with headaches.  See previous note for details.  Saw Dr Manuella Ghazi.  See his note for details.  Discussed with him regarding rebound headaches.  He has not cut back on tylenol intake.  Takes for his head.  States he does not feel he can do without the tylenol.  Gabapentin was increased to 300mg  tid.  Has f/u tomorrow with neurology.  He has noticed some increased swelling - left elbow.  Increased tenderness if he props up on his elbow.  States he does this a lot while working on the computer.  No pain with flexion of his arm.  No other trauma or injury.  Also has noticed increased swelling in his feet - left > right.  Does not extend up his legs.  No increased sob.  Tries to stay active.  No cardiac symptoms with increased activity or exertion.  Feet are better in the am.  Still smoking. Has cut back.  Still drinking.  Discussed the need to stop drinking.  States blood pressure has been running lower.  Blood pressures averaging 120/70.  Has had readings 100/60.     Past Medical History  Diagnosis Date  . Hypertension   . Hypercholesterolemia   . Irritable bowel syndrome   . GERD (gastroesophageal reflux disease)   . Colonic polyp   . Monoclonal gammopathy   . Hyperkalemia   . COPD (chronic obstructive pulmonary disease)   . Adrenal gland anomaly     enlargement  . Diverticulosis   . Depression   . Degenerative disc disease, lumbar   . Carpal tunnel syndrome     Outpatient Encounter Prescriptions as of 02/21/2015  Medication Sig  . acetaminophen (TYLENOL) 325 MG tablet Take 650 mg by mouth every 6 (six) hours as needed for pain.  Marland Kitchen EPINEPHrine (EPI-PEN) 0.3 mg/0.3 mL SOAJ injection Inject 0.3 mLs (0.3 mg total) into the muscle once.  . fluticasone (FLONASE) 50  MCG/ACT nasal spray Place 2 sprays into both nostrils daily.  . furosemide (LASIX) 20 MG tablet Take 1 tablet (20 mg total) by mouth daily as needed.  . gabapentin (NEURONTIN) 100 MG capsule Take two each morning, two each afternoon and three at bedtime  . loperamide (IMODIUM) 2 MG capsule Take 2 mg by mouth daily as needed.  Marland Kitchen losartan (COZAAR) 100 MG tablet Take 1 tablet (100 mg total) by mouth daily.  . metoprolol (LOPRESSOR) 100 MG tablet Take 1 tablet (100 mg total) by mouth 2 (two) times daily.  . ondansetron (ZOFRAN) 8 MG tablet Take 8 mg by mouth every 8 (eight) hours as needed.  . pantoprazole (PROTONIX) 40 MG tablet Take 1 tablet (40 mg total) by mouth 2 (two) times daily.  . traMADol (ULTRAM) 50 MG tablet Take 1 tablet (50 mg total) by mouth every 6 (six) hours as needed.  . [DISCONTINUED] amLODipine (NORVASC) 10 MG tablet Take 1 tablet (10 mg total) by mouth daily.  Marland Kitchen amLODipine (NORVASC) 5 MG tablet Take 1 tablet (5 mg total) by mouth daily.  . [DISCONTINUED] budesonide (ENTOCORT EC) 3 MG 24 hr capsule Take 3 mg by mouth daily.  . [DISCONTINUED] St Johns Wort 300 MG TABS Take 300 mg by mouth every  morning.   No facility-administered encounter medications on file as of 02/21/2015.    Review of Systems  Constitutional: Negative for appetite change and unexpected weight change.  HENT: Negative for congestion and sinus pressure.   Respiratory: Negative for cough, chest tightness and shortness of breath.   Cardiovascular: Negative for chest pain and palpitations.       Swelling in his feet - left > right.    Gastrointestinal: Positive for diarrhea. Negative for nausea, vomiting and abdominal pain.  Genitourinary: Negative for dysuria and difficulty urinating.  Musculoskeletal:       Increased soft tissue - left elbow.  Tenderness to palpation.  No pain with flexion.   Skin: Negative for color change and rash.  Neurological: Positive for headaches. Negative for dizziness.    Hematological: Negative for adenopathy.  Psychiatric/Behavioral: Negative for dysphoric mood and agitation.       Objective:     Blood pressure recheck:  120/78, pulse 56-60  Physical Exam  Constitutional: He appears well-developed and well-nourished. No distress.  HENT:  Nose: Nose normal.  Mouth/Throat: Oropharynx is clear and moist.  Neck: Neck supple. No thyromegaly present.  Cardiovascular: Normal rate and regular rhythm.   Pulmonary/Chest: Effort normal and breath sounds normal. No respiratory distress.  Abdominal: Soft. Bowel sounds are normal. There is no tenderness.  Musculoskeletal:  Pedal edema - left greater than right.  No increased swelling extending up the legs.  Increased soft tissue - left elbow.  Increased tenderness to palpation.  No paoin with flexion of the elbow.  No increased erythema.    Lymphadenopathy:    He has no cervical adenopathy.  Skin: No rash noted. No erythema.  Psychiatric: He has a normal mood and affect. His behavior is normal.    BP 120/70 mmHg  Pulse 57  Temp(Src) 98.5 F (36.9 C) (Oral)  Ht 5\' 11"  (1.803 m)  Wt 198 lb 8 oz (90.039 kg)  BMI 27.70 kg/m2  SpO2 96% Wt Readings from Last 3 Encounters:  02/21/15 198 lb 8 oz (90.039 kg)  10/19/14 200 lb 4 oz (90.833 kg)  06/16/14 197 lb 8 oz (89.585 kg)     Lab Results  Component Value Date   WBC 8.0 06/15/2014   HGB 13.6 06/15/2014   HCT 40.5 06/15/2014   PLT 399.0 06/15/2014   GLUCOSE 85 11/09/2014   CHOL 163 10/17/2014   TRIG 84.0 10/17/2014   HDL 66.00 10/17/2014   LDLCALC 80 10/17/2014   ALT 14 10/17/2014   AST 15 10/17/2014   NA 129* 11/09/2014   K 4.8 11/09/2014   CL 97 11/09/2014   CREATININE 1.11 11/09/2014   BUN 17 11/09/2014   CO2 27 11/09/2014   TSH 1.37 02/09/2014   PSA 1.24 11/09/2014       Assessment & Plan:   Problem List Items Addressed This Visit    Anemia    Has had extensive GI w/up.  Saw hematology.  Follow cbc.       Relevant Orders    CBC with Differential/Platelet   CAD (coronary artery disease)    Heart cath revealed 60% stenosis - RCA.  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  Sees Dr Saralyn Pilar.  States needs referral for follow up.        Relevant Medications   amLODipine (NORVASC) 5 MG tablet   Other Relevant Orders   Ambulatory referral to Cardiology   Lipid panel   Hepatic function panel   Chronic back pain    On tramadol.  Collagenous colitis    Recently colonoscopy as outlined.  Followed by GI.  asacol not covered by insurance.        Diarrhea - Primary    Followed by GI.  See their last note for details.        GERD (gastroesophageal reflux disease)    EGD as outlined.  Upper symptoms controlled.        Relevant Medications   ondansetron (ZOFRAN) 8 MG tablet   loperamide (IMODIUM) 2 MG capsule   Headache    Headache persistent.  Due to f/u with neurology tomorrow.  On increased gabapentin.  Has not cut back on tylenol intake.        Relevant Medications   amLODipine (NORVASC) 5 MG tablet   Health care maintenance    Physical 03/16/14.  Colonoscopy 08/25/14 as outlined.  PSA 1.24 - 11/09/14.        Hypertension    Blood pressure as outlined.  Will decrease amlodipine to 5mg  q day.  Follow pressures.  He will send in readings over the next 3-4 weeks.  May help the pedal edema.        Relevant Medications   amLODipine (NORVASC) 5 MG tablet   Other Relevant Orders   Basic metabolic panel   TSH   Hyponatremia    Per nephrology - may have some factitious hyponatremia.  Last check stable 129.  Recheck with next labs.  Decrease alcohol intake.        Monoclonal gammopathy    Saw hematology.        Relevant Orders   Protein Electrophoresis, (serum)   Olecranon bursitis of left elbow    Desires no further intervention at this time.  Avoid direct pressure.        I spent 25 minutes with the patient and more than 50% of the time was spent in consultation regarding the above.     Einar Pheasant, MD

## 2015-02-22 ENCOUNTER — Encounter: Payer: Self-pay | Admitting: Internal Medicine

## 2015-02-22 DIAGNOSIS — M7022 Olecranon bursitis, left elbow: Secondary | ICD-10-CM | POA: Insufficient documentation

## 2015-02-22 NOTE — Assessment & Plan Note (Signed)
Followed by GI.  See their last note for details.

## 2015-02-22 NOTE — Assessment & Plan Note (Signed)
On tramadol

## 2015-02-22 NOTE — Assessment & Plan Note (Signed)
Recently colonoscopy as outlined.  Followed by GI.  asacol not covered by insurance.

## 2015-02-22 NOTE — Assessment & Plan Note (Signed)
Saw hematology

## 2015-02-22 NOTE — Assessment & Plan Note (Signed)
Desires no further intervention at this time.  Avoid direct pressure.

## 2015-02-22 NOTE — Assessment & Plan Note (Signed)
Headache persistent.  Due to f/u with neurology tomorrow.  On increased gabapentin.  Has not cut back on tylenol intake.

## 2015-02-22 NOTE — Assessment & Plan Note (Signed)
Heart cath revealed 60% stenosis - RCA.  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  Sees Dr Saralyn Pilar.  States needs referral for follow up.

## 2015-02-22 NOTE — Assessment & Plan Note (Signed)
Has had extensive GI w/up.  Saw hematology.  Follow cbc.

## 2015-02-22 NOTE — Assessment & Plan Note (Signed)
Physical 03/16/14.  Colonoscopy 08/25/14 as outlined.  PSA 1.24 - 11/09/14.

## 2015-02-22 NOTE — Assessment & Plan Note (Signed)
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  Last check stable 129.  Recheck with next labs.  Decrease alcohol intake.

## 2015-02-22 NOTE — Assessment & Plan Note (Signed)
Blood pressure as outlined.  Will decrease amlodipine to 5mg  q day.  Follow pressures.  He will send in readings over the next 3-4 weeks.  May help the pedal edema.

## 2015-02-22 NOTE — Assessment & Plan Note (Signed)
EGD as outlined.  Upper symptoms controlled.

## 2015-02-26 ENCOUNTER — Encounter: Payer: Self-pay | Admitting: Internal Medicine

## 2015-03-14 ENCOUNTER — Other Ambulatory Visit: Payer: Self-pay

## 2015-03-14 MED ORDER — FUROSEMIDE 20 MG PO TABS: 20.0000 mg | ORAL_TABLET | Freq: Every day | ORAL | Status: DC | PRN

## 2015-03-14 MED ORDER — GABAPENTIN 100 MG PO CAPS: ORAL_CAPSULE | ORAL | Status: DC

## 2015-03-24 ENCOUNTER — Other Ambulatory Visit: Payer: Self-pay | Admitting: Family Medicine

## 2015-03-29 ENCOUNTER — Encounter: Payer: Self-pay | Admitting: Internal Medicine

## 2015-04-03 ENCOUNTER — Encounter: Payer: Self-pay | Admitting: *Deleted

## 2015-04-17 ENCOUNTER — Other Ambulatory Visit: Payer: Self-pay | Admitting: Internal Medicine

## 2015-05-18 ENCOUNTER — Other Ambulatory Visit: Payer: Self-pay

## 2015-05-18 MED ORDER — METOPROLOL TARTRATE 100 MG PO TABS: 100.0000 mg | ORAL_TABLET | Freq: Two times a day (BID) | ORAL | Status: DC

## 2015-05-26 ENCOUNTER — Ambulatory Visit (INDEPENDENT_AMBULATORY_CARE_PROVIDER_SITE_OTHER): Payer: Commercial Managed Care - HMO | Admitting: Internal Medicine

## 2015-05-26 ENCOUNTER — Encounter: Payer: Self-pay | Admitting: Internal Medicine

## 2015-05-26 VITALS — BP 120/80 | HR 57 | Temp 98.1°F | Resp 18 | Ht 70.0 in | Wt 188.0 lb

## 2015-05-26 DIAGNOSIS — I10 Essential (primary) hypertension: Secondary | ICD-10-CM | POA: Diagnosis not present

## 2015-05-26 DIAGNOSIS — D472 Monoclonal gammopathy: Secondary | ICD-10-CM

## 2015-05-26 DIAGNOSIS — D649 Anemia, unspecified: Secondary | ICD-10-CM

## 2015-05-26 DIAGNOSIS — I2510 Atherosclerotic heart disease of native coronary artery without angina pectoris: Secondary | ICD-10-CM | POA: Diagnosis not present

## 2015-05-26 DIAGNOSIS — G8929 Other chronic pain: Secondary | ICD-10-CM

## 2015-05-26 DIAGNOSIS — G4730 Sleep apnea, unspecified: Secondary | ICD-10-CM

## 2015-05-26 DIAGNOSIS — K52839 Microscopic colitis, unspecified: Secondary | ICD-10-CM | POA: Diagnosis not present

## 2015-05-26 DIAGNOSIS — Z0000 Encounter for general adult medical examination without abnormal findings: Secondary | ICD-10-CM

## 2015-05-26 DIAGNOSIS — K52831 Collagenous colitis: Secondary | ICD-10-CM | POA: Diagnosis not present

## 2015-05-26 DIAGNOSIS — Z23 Encounter for immunization: Secondary | ICD-10-CM | POA: Diagnosis not present

## 2015-05-26 DIAGNOSIS — M549 Dorsalgia, unspecified: Secondary | ICD-10-CM

## 2015-05-26 DIAGNOSIS — K219 Gastro-esophageal reflux disease without esophagitis: Secondary | ICD-10-CM

## 2015-05-26 DIAGNOSIS — E871 Hypo-osmolality and hyponatremia: Secondary | ICD-10-CM

## 2015-05-26 DIAGNOSIS — M7022 Olecranon bursitis, left elbow: Secondary | ICD-10-CM

## 2015-05-26 MED ORDER — TRAMADOL HCL 50 MG PO TABS: 50.0000 mg | ORAL_TABLET | Freq: Four times a day (QID) | ORAL | Status: DC | PRN

## 2015-05-26 NOTE — Patient Instructions (Signed)

## 2015-05-26 NOTE — Progress Notes (Signed)
Patient ID: Troy Lopez, male   DOB: Jun 24, 1941, 74 y.o.   MRN: [id number]   Subjective:    Patient ID: Troy Lopez, male    DOB: Jan 01, 1941, 74 y.o.   MRN: [id number]  HPI  Patient with past history of CAD, hypertension, GERD, collagenous colitis and monoclonal gammopathy.  He comes in today to follow up on these issues as well as for a complete physical exam.  He is still having bowel issues.  Has intermittent flares.  Recently has noticed after he wakes up, he will have a bowel movement.  Then develops increased gas and abdominal pain.  This is occurring every day.  He is back on entocort.  No change yet is symptoms.  Started back on entocort 05/18/15.  Food has no taste.  He has lost weight.  Decreased appetite.  He is followed by GI.  Cancelled his last GI appt.  Breathing stable.  No cardiac symptoms with increased activity or exertion.  Back starting to bother him some.  No pain radiating down his legs.     Past Medical History  Diagnosis Date  . Hypertension   . Hypercholesterolemia   . Irritable bowel syndrome   . GERD (gastroesophageal reflux disease)   . Colonic polyp   . Monoclonal gammopathy   . Hyperkalemia   . COPD (chronic obstructive pulmonary disease) ([place])   . Adrenal gland anomaly     enlargement  . Diverticulosis   . Depression   . Degenerative disc disease, lumbar   . Carpal tunnel syndrome    Past Surgical History  Procedure Laterality Date  . Bunionectomy  1989  . Hemorrhoid surgery    . Cholecystectomy  09/07  . Eye surgery Bilateral 2010    cataract  . Carpal tunnel release Left 2011    ulnar nerve sub muscular at elbow   Family History  Problem Relation Age of Onset  . Stroke Mother   . Heart disease Father     MI - 92   . Colon cancer Neg Hx   . Prostate cancer Neg Hx    Social History   Social History  . Marital Status: Married    Spouse Name: N/A  . Number of Children: 3  . Years of Education: N/A   Social History Main Topics  .  Smoking status: Current Some Day Smoker    Types: Cigarettes  . Smokeless tobacco: Never Used     Comment: about 2 cigarettes per day, trying to quit  . Alcohol Use: 0.0 oz/week    0 Standard drinks or equivalent per week     Comment: occas  . Drug Use: No  . Sexual Activity: Not Asked   Other Topics Concern  . None   Social History Narrative    Outpatient Encounter Prescriptions as of 05/26/2015  Medication Sig  . acetaminophen (TYLENOL) 325 MG tablet Take 650 mg by mouth every 6 (six) hours as needed for pain.  Marland Kitchen amLODipine (NORVASC) 5 MG tablet Take 1 tablet (5 mg total) by mouth daily.  Marland Kitchen EPINEPHrine (EPI-PEN) 0.3 mg/0.3 mL SOAJ injection Inject 0.3 mLs (0.3 mg total) into the muscle once.  . fluticasone (FLONASE) 50 MCG/ACT nasal spray Place 2 sprays into both nostrils daily.  . furosemide (LASIX) 20 MG tablet Take 1 tablet (20 mg total) by mouth daily as needed.  . gabapentin (NEURONTIN) 100 MG capsule Take two each morning, two each afternoon and three at bedtime  . loperamide (IMODIUM) 2 MG  capsule Take 2 mg by mouth daily as needed.  Marland Kitchen losartan (COZAAR) 100 MG tablet Take 1 tablet (100 mg total) by mouth daily.  . metoprolol (LOPRESSOR) 100 MG tablet Take 1 tablet (100 mg total) by mouth 2 (two) times daily.  . ondansetron (ZOFRAN) 8 MG tablet Take 8 mg by mouth every 8 (eight) hours as needed.  . pantoprazole (PROTONIX) 40 MG tablet Take 1 tablet (40 mg total) by mouth 2 (two) times daily.  . traMADol (ULTRAM) 50 MG tablet Take 1 tablet (50 mg total) by mouth every 6 (six) hours as needed.  . [DISCONTINUED] traMADol (ULTRAM) 50 MG tablet Take 1 tablet (50 mg total) by mouth every 6 (six) hours as needed.   No facility-administered encounter medications on file as of 05/26/2015.    Review of Systems  Constitutional: Positive for appetite change (decreased appetite. with weight loss.  ).  HENT: Negative for congestion and sinus pressure.   Eyes: Negative for pain and  visual disturbance.  Respiratory: Negative for cough, chest tightness and shortness of breath.   Cardiovascular: Negative for chest pain, palpitations and leg swelling.  Gastrointestinal: Positive for abdominal pain. Negative for nausea and vomiting.       Bowel issues as outlined.  Increased gas.    Genitourinary: Negative for dysuria and difficulty urinating.  Musculoskeletal: Positive for back pain. Negative for joint swelling.  Skin: Negative for color change and rash.  Neurological: Negative for dizziness, light-headedness and headaches.  Hematological: Negative for adenopathy. Does not bruise/bleed easily.  Psychiatric/Behavioral: Negative for dysphoric mood and agitation.       Objective:    Physical Exam  Constitutional: He appears well-developed and well-nourished. No distress.  HENT:  Nose: Nose normal.  Mouth/Throat: Oropharynx is clear and moist.  Eyes: Conjunctivae are normal. Right eye exhibits no discharge. Left eye exhibits no discharge.  Neck: Neck supple. No thyromegaly present.  Cardiovascular: Normal rate and regular rhythm.   Pulmonary/Chest: Effort normal and breath sounds normal. No respiratory distress.  Abdominal: Soft. Bowel sounds are normal. There is no tenderness.  Genitourinary:  Rectal exam - heme negative.  No palpable prostate nodules.    Musculoskeletal: He exhibits no edema or tenderness.  Increased soft tissue - left elbow.  No increased erythema.  No increased warmth.  Non tender.   Lymphadenopathy:    He has no cervical adenopathy.  Skin: No rash noted. No erythema.  Psychiatric: He has a normal mood and affect. His behavior is normal.    BP 120/80 mmHg  Pulse 57  Temp(Src) 98.1 F (36.7 C) (Oral)  Resp 18  Ht 5\' 10"  (1.778 m)  Wt 188 lb (85.276 kg)  BMI 26.98 kg/m2  SpO2 95% Wt Readings from Last 3 Encounters:  05/26/15 188 lb (85.276 kg)  02/21/15 198 lb 8 oz (90.039 kg)  10/19/14 200 lb 4 oz (90.833 kg)     Lab Results    Component Value Date   WBC 8.0 06/15/2014   HGB 13.6 06/15/2014   HCT 40.5 06/15/2014   PLT 399.0 06/15/2014   GLUCOSE 85 11/09/2014   CHOL 163 10/17/2014   TRIG 84.0 10/17/2014   HDL 66.00 10/17/2014   LDLCALC 80 10/17/2014   ALT 14 10/17/2014   AST 15 10/17/2014   NA 129* 11/09/2014   K 4.8 11/09/2014   CL 97 11/09/2014   CREATININE 1.11 11/09/2014   BUN 17 11/09/2014   CO2 27 11/09/2014   TSH 1.37 02/09/2014   PSA  1.24 11/09/2014       Assessment & Plan:   Problem List Items Addressed This Visit    Anemia    Extensive GI w/up.  Refer back as outlined.  Saw Dr Cynda Acres.  Recommended following cbc.       CAD (coronary artery disease)    Is s/p heart cath that revealed 60% stenosis - RCA.  Medical management.  Followed by Dr Saralyn Pilar.  Continue risk factor modification.        Chronic back pain    Persistent.  On tramadol.  Follow.        Relevant Medications   traMADol (ULTRAM) 50 MG tablet   Collagenous colitis    Colonoscopy 08/25/14.  Erythematous and inflamed mucsa in the sigmoid colon.  Back on entocort now.  Symptoms as outlined.  With decreased appetite and weight loss.  Refer back to GI for further evaluation and treatment.        GERD (gastroesophageal reflux disease)    EGD 08/25/14 - normal esophagus.  Duodenal stricutre.  Continue protonix.       Health care maintenance    Physical today (05/26/15).  Colonoscopy 08/25/14 as outlined.  PSA 1.24 - 11/09/14.        Hypertension    Blood pressure has been under good control.  Continue same medication regimen.  Follow pressures.  Follow metabolic panel.        Hyponatremia    Per nephrology - may have some factitious hyponatremia.  Recheck sodium.  Discussed the need to decrease alcohol.        Monoclonal gammopathy    Worked up by Dr Cynda Acres.  See his note for details.  Follow SIEP and cbc q 4 months.  Check with next labs.       Olecranon bursitis of left elbow    Persistent.  Some pain.  Desires  referral now.  Refer to Dr Jefm Bryant.       Relevant Orders   Ambulatory referral to Rheumatology   Sleep apnea    Confirmed with sleep study.  Needs to use CPAP.         Other Visit Diagnoses    Encounter for immunization    -  Primary    Microscopic colitis, unspecified microscopic colitis type        Relevant Orders    Ambulatory referral to Gastroenterology        Einar Pheasant, MD

## 2015-05-26 NOTE — Progress Notes (Signed)
Pre-visit discussion using our clinic review tool. No additional management support is needed unless otherwise documented below in the visit note.  

## 2015-05-27 ENCOUNTER — Encounter: Payer: Self-pay | Admitting: Internal Medicine

## 2015-05-27 NOTE — Assessment & Plan Note (Signed)
Blood pressure has been under good control.  Continue same medication regimen.  Follow pressures.  Follow metabolic panel.   

## 2015-05-27 NOTE — Assessment & Plan Note (Signed)
Persistent.  On tramadol.  Follow.

## 2015-05-27 NOTE — Assessment & Plan Note (Signed)
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  Recheck sodium.  Discussed the need to decrease alcohol.

## 2015-05-27 NOTE — Assessment & Plan Note (Signed)
Colonoscopy 08/25/14.  Erythematous and inflamed mucsa in the sigmoid colon.  Back on entocort now.  Symptoms as outlined.  With decreased appetite and weight loss.  Refer back to GI for further evaluation and treatment.

## 2015-05-27 NOTE — Assessment & Plan Note (Signed)
Persistent.  Some pain.  Desires referral now.  Refer to Dr Jefm Bryant.

## 2015-05-27 NOTE — Assessment & Plan Note (Signed)
Extensive GI w/up.  Refer back as outlined.  Saw Dr Cynda Acres.  Recommended following cbc.

## 2015-05-27 NOTE — Assessment & Plan Note (Addendum)
Physical today (05/26/15).  Colonoscopy 08/25/14 as outlined.  PSA 1.24 - 11/09/14.

## 2015-05-27 NOTE — Assessment & Plan Note (Signed)
Confirmed with sleep study.  Needs to use CPAP.  

## 2015-05-27 NOTE — Assessment & Plan Note (Signed)
Worked up by Dr Cynda Acres.  See his note for details.  Follow SIEP and cbc q 4 months.  Check with next labs.

## 2015-05-27 NOTE — Assessment & Plan Note (Signed)
Is s/p heart cath that revealed 60% stenosis - RCA.  Medical management.  Followed by Dr Saralyn Pilar.  Continue risk factor modification.

## 2015-05-27 NOTE — Assessment & Plan Note (Signed)
EGD 08/25/14 - normal esophagus.  Duodenal stricutre.  Continue protonix.

## 2015-05-29 ENCOUNTER — Telehealth: Payer: Self-pay | Admitting: *Deleted

## 2015-05-29 NOTE — Telephone Encounter (Signed)
[facility] requested patient's notes and labs be faxed to the [facility] Department, patient has an upcoming appt Friday. The fax number: [phone] Attn: Jenny Reichmann

## 2015-05-29 NOTE — Telephone Encounter (Signed)
Last OV note & labs sent via e-fax

## 2015-06-12 ENCOUNTER — Other Ambulatory Visit (INDEPENDENT_AMBULATORY_CARE_PROVIDER_SITE_OTHER): Payer: Commercial Managed Care - HMO

## 2015-06-12 ENCOUNTER — Other Ambulatory Visit: Payer: Self-pay | Admitting: Internal Medicine

## 2015-06-12 DIAGNOSIS — D649 Anemia, unspecified: Secondary | ICD-10-CM | POA: Diagnosis not present

## 2015-06-12 DIAGNOSIS — I10 Essential (primary) hypertension: Secondary | ICD-10-CM

## 2015-06-12 DIAGNOSIS — D472 Monoclonal gammopathy: Secondary | ICD-10-CM

## 2015-06-12 DIAGNOSIS — I2510 Atherosclerotic heart disease of native coronary artery without angina pectoris: Secondary | ICD-10-CM

## 2015-06-12 LAB — TSH: TSH: 1.75 u[IU]/mL (ref 0.35–4.50)

## 2015-06-12 LAB — CBC WITH DIFFERENTIAL/PLATELET
Basophils Absolute: 0 10*3/uL (ref 0.0–0.1)
Basophils Relative: 0.5 % (ref 0.0–3.0)
EOS ABS: 0 10*3/uL (ref 0.0–0.7)
EOS PCT: 0.3 % (ref 0.0–5.0)
HCT: 40.7 % (ref 39.0–52.0)
HEMOGLOBIN: 13.6 g/dL (ref 13.0–17.0)
Lymphocytes Relative: 27 % (ref 12.0–46.0)
Lymphs Abs: 2.2 10*3/uL (ref 0.7–4.0)
MCHC: 33.4 g/dL (ref 30.0–36.0)
MCV: 101.1 fl — ABNORMAL HIGH (ref 78.0–100.0)
MONO ABS: 0.8 10*3/uL (ref 0.1–1.0)
Monocytes Relative: 10 % (ref 3.0–12.0)
NEUTROS ABS: 5 10*3/uL (ref 1.4–7.7)
NEUTROS PCT: 62.2 % (ref 43.0–77.0)
Platelets: 419 10*3/uL — ABNORMAL HIGH (ref 150.0–400.0)
RBC: 4.03 Mil/uL — ABNORMAL LOW (ref 4.22–5.81)
RDW: 15.5 % (ref 11.5–15.5)
WBC: 8 10*3/uL (ref 4.0–10.5)

## 2015-06-12 LAB — BASIC METABOLIC PANEL
BUN: 12 mg/dL (ref 6–23)
CO2: 28 mEq/L (ref 19–32)
CREATININE: 1.09 mg/dL (ref 0.40–1.50)
Calcium: 10.2 mg/dL (ref 8.4–10.5)
Chloride: 94 mEq/L — ABNORMAL LOW (ref 96–112)
GFR: 70.43 mL/min (ref >60.00)
GLUCOSE: 102 mg/dL — AB (ref 70–99)
POTASSIUM: 4.8 meq/L (ref 3.5–5.1)
Sodium: 129 mEq/L — ABNORMAL LOW (ref 135–145)

## 2015-06-12 LAB — LIPID PANEL
CHOLESTEROL: 171 mg/dL (ref 0–200)
HDL: 77.1 mg/dL (ref >39.00)
LDL CALC: 80 mg/dL (ref 0–99)
NonHDL: 93.92
TRIGLYCERIDES: 72 mg/dL (ref 0.0–149.0)
Total CHOL/HDL Ratio: 2
VLDL: 14.4 mg/dL (ref 0.0–40.0)

## 2015-06-12 LAB — HEPATIC FUNCTION PANEL
ALK PHOS: 52 U/L (ref 39–117)
ALT: 11 U/L (ref 0–53)
AST: 14 U/L (ref 0–37)
Albumin: 3.7 g/dL (ref 3.5–5.2)
BILIRUBIN DIRECT: 0 mg/dL (ref 0.0–0.3)
BILIRUBIN TOTAL: 0.6 mg/dL (ref 0.2–1.2)
TOTAL PROTEIN: 8.6 g/dL — AB (ref 6.0–8.3)

## 2015-06-13 ENCOUNTER — Other Ambulatory Visit: Payer: Self-pay | Admitting: Internal Medicine

## 2015-06-13 ENCOUNTER — Encounter: Payer: Self-pay | Admitting: *Deleted

## 2015-06-13 DIAGNOSIS — D75839 Thrombocytosis, unspecified: Secondary | ICD-10-CM

## 2015-06-13 DIAGNOSIS — D473 Essential (hemorrhagic) thrombocythemia: Secondary | ICD-10-CM

## 2015-06-13 NOTE — Progress Notes (Signed)
Order placed for f/u cbc.   

## 2015-06-14 ENCOUNTER — Telehealth: Payer: Self-pay | Admitting: Internal Medicine

## 2015-06-14 LAB — PROTEIN ELECTROPHORESIS, SERUM
ABNORMAL PROTEIN BAND1: 2.1 g/dL
ALBUMIN ELP: 3.8 g/dL (ref 3.8–4.8)
ALPHA-2-GLOBULIN: 0.6 g/dL (ref 0.5–0.9)
Alpha-1-Globulin: 0.4 g/dL — ABNORMAL HIGH (ref 0.2–0.3)
BETA GLOBULIN: 0.9 g/dL — AB (ref 0.4–0.6)
Beta 2: 0.2 g/dL (ref 0.2–0.5)
Gamma Globulin: 2.5 g/dL — ABNORMAL HIGH (ref 0.8–1.7)
TOTAL PROTEIN, SERUM ELECTROPHOR: 8.4 g/dL — AB (ref 6.1–8.1)

## 2015-06-14 NOTE — Telephone Encounter (Signed)
Pt called wanting to know why is he scheduled for another lab appt? Pt states he just had a lab appt on 06/12/2015? Thank You!

## 2015-06-14 NOTE — Telephone Encounter (Signed)
Spoke with patient, he was not happy that he just received a phone call that he has more labs ordered.  Researched and found that labs were mailed to patient and appointment scheduled to re check CBC to see if it is stable.  This information relayed to patient.  He again was not happy to have wasted twenty minutes dealing with this.  Confirmed that he is coming to the lab appointment on 11/8 at 1145.

## 2015-06-14 NOTE — Telephone Encounter (Signed)
All right. Thank you!

## 2015-06-23 ENCOUNTER — Other Ambulatory Visit: Payer: Self-pay | Admitting: Internal Medicine

## 2015-06-23 DIAGNOSIS — D472 Monoclonal gammopathy: Secondary | ICD-10-CM

## 2015-06-23 NOTE — Progress Notes (Signed)
Order placed for hematology referral.  

## 2015-06-27 ENCOUNTER — Other Ambulatory Visit (INDEPENDENT_AMBULATORY_CARE_PROVIDER_SITE_OTHER): Payer: Commercial Managed Care - HMO

## 2015-06-27 DIAGNOSIS — D473 Essential (hemorrhagic) thrombocythemia: Secondary | ICD-10-CM

## 2015-06-27 DIAGNOSIS — D75839 Thrombocytosis, unspecified: Secondary | ICD-10-CM

## 2015-06-29 ENCOUNTER — Other Ambulatory Visit (INDEPENDENT_AMBULATORY_CARE_PROVIDER_SITE_OTHER): Payer: Commercial Managed Care - HMO

## 2015-06-29 DIAGNOSIS — D473 Essential (hemorrhagic) thrombocythemia: Secondary | ICD-10-CM

## 2015-06-29 LAB — CBC WITH DIFFERENTIAL/PLATELET
BASOS PCT: 0.3 % (ref 0.0–3.0)
Basophils Absolute: 0 10*3/uL (ref 0.0–0.1)
EOS ABS: 0 10*3/uL (ref 0.0–0.7)
EOS PCT: 0.2 % (ref 0.0–5.0)
HEMATOCRIT: 40.7 % (ref 39.0–52.0)
HEMOGLOBIN: 13.5 g/dL (ref 13.0–17.0)
LYMPHS PCT: 24.5 % (ref 12.0–46.0)
Lymphs Abs: 2.2 10*3/uL (ref 0.7–4.0)
MCHC: 33.3 g/dL (ref 30.0–36.0)
MCV: 102.1 fl — AB (ref 78.0–100.0)
MONOS PCT: 10.2 % (ref 3.0–12.0)
Monocytes Absolute: 0.9 10*3/uL (ref 0.1–1.0)
Neutro Abs: 5.9 10*3/uL (ref 1.4–7.7)
Neutrophils Relative %: 64.8 % (ref 43.0–77.0)
Platelets: 436 10*3/uL — ABNORMAL HIGH (ref 150.0–400.0)
RBC: 3.98 Mil/uL — ABNORMAL LOW (ref 4.22–5.81)
RDW: 15.1 % (ref 11.5–15.5)
WBC: 9.1 10*3/uL (ref 4.0–10.5)

## 2015-06-30 ENCOUNTER — Ambulatory Visit: Payer: Self-pay | Admitting: Oncology

## 2015-07-03 ENCOUNTER — Other Ambulatory Visit: Payer: Self-pay | Admitting: Internal Medicine

## 2015-07-03 ENCOUNTER — Encounter: Payer: Self-pay | Admitting: Internal Medicine

## 2015-07-07 ENCOUNTER — Inpatient Hospital Stay: Payer: Commercial Managed Care - HMO | Attending: Oncology | Admitting: Oncology

## 2015-07-07 ENCOUNTER — Inpatient Hospital Stay: Payer: Commercial Managed Care - HMO

## 2015-07-07 VITALS — BP 181/99 | HR 60 | Temp 96.6°F | Resp 18 | Wt 193.8 lb

## 2015-07-07 DIAGNOSIS — F17210 Nicotine dependence, cigarettes, uncomplicated: Secondary | ICD-10-CM | POA: Diagnosis not present

## 2015-07-07 DIAGNOSIS — D472 Monoclonal gammopathy: Secondary | ICD-10-CM | POA: Diagnosis not present

## 2015-07-07 DIAGNOSIS — M5136 Other intervertebral disc degeneration, lumbar region: Secondary | ICD-10-CM | POA: Diagnosis not present

## 2015-07-07 DIAGNOSIS — K589 Irritable bowel syndrome without diarrhea: Secondary | ICD-10-CM | POA: Insufficient documentation

## 2015-07-07 DIAGNOSIS — E278 Other specified disorders of adrenal gland: Secondary | ICD-10-CM | POA: Insufficient documentation

## 2015-07-07 DIAGNOSIS — K219 Gastro-esophageal reflux disease without esophagitis: Secondary | ICD-10-CM | POA: Diagnosis not present

## 2015-07-07 DIAGNOSIS — Z800 Family history of malignant neoplasm of digestive organs: Secondary | ICD-10-CM | POA: Diagnosis not present

## 2015-07-07 DIAGNOSIS — E875 Hyperkalemia: Secondary | ICD-10-CM | POA: Diagnosis not present

## 2015-07-07 DIAGNOSIS — E7800 Pure hypercholesterolemia, unspecified: Secondary | ICD-10-CM | POA: Insufficient documentation

## 2015-07-07 DIAGNOSIS — K5790 Diverticulosis of intestine, part unspecified, without perforation or abscess without bleeding: Secondary | ICD-10-CM | POA: Diagnosis not present

## 2015-07-07 DIAGNOSIS — Z86010 Personal history of colonic polyps: Secondary | ICD-10-CM | POA: Diagnosis not present

## 2015-07-07 DIAGNOSIS — F329 Major depressive disorder, single episode, unspecified: Secondary | ICD-10-CM | POA: Diagnosis not present

## 2015-07-07 DIAGNOSIS — G5600 Carpal tunnel syndrome, unspecified upper limb: Secondary | ICD-10-CM | POA: Diagnosis not present

## 2015-07-07 DIAGNOSIS — I10 Essential (primary) hypertension: Secondary | ICD-10-CM | POA: Insufficient documentation

## 2015-07-07 DIAGNOSIS — J449 Chronic obstructive pulmonary disease, unspecified: Secondary | ICD-10-CM | POA: Insufficient documentation

## 2015-07-07 DIAGNOSIS — F449 Dissociative and conversion disorder, unspecified: Secondary | ICD-10-CM

## 2015-07-07 DIAGNOSIS — E785 Hyperlipidemia, unspecified: Secondary | ICD-10-CM

## 2015-07-07 LAB — BASIC METABOLIC PANEL
Anion gap: 10 (ref 5–15)
BUN: 9 mg/dL (ref 6–20)
CHLORIDE: 94 mmol/L — AB (ref 101–111)
CO2: 26 mmol/L (ref 22–32)
CREATININE: 0.97 mg/dL (ref 0.61–1.24)
Calcium: 9.4 mg/dL (ref 8.9–10.3)
GFR calc Af Amer: >60 mL/min (ref >60)
GFR calc non Af Amer: >60 mL/min (ref >60)
Glucose, Bld: 114 mg/dL — ABNORMAL HIGH (ref 65–99)
Potassium: 4.5 mmol/L (ref 3.5–5.1)
SODIUM: 130 mmol/L — AB (ref 135–145)

## 2015-07-07 LAB — CBC
HEMATOCRIT: 38.9 % — AB (ref 40.0–52.0)
HEMOGLOBIN: 13.4 g/dL (ref 13.0–18.0)
MCH: 34.4 pg — AB (ref 26.0–34.0)
MCHC: 34.5 g/dL (ref 32.0–36.0)
MCV: 99.8 fL (ref 80.0–100.0)
Platelets: 406 10*3/uL (ref 150–440)
RBC: 3.89 MIL/uL — ABNORMAL LOW (ref 4.40–5.90)
RDW: 14.4 % (ref 11.5–14.5)
WBC: 7.7 10*3/uL (ref 3.8–10.6)

## 2015-07-07 NOTE — Progress Notes (Signed)
Patient was last seen by Dr. Inez Pilgrim in 05/2014 and he released him from clinic back to PCP.  His PCP has been monitoring his labs and would like for him to come back to [facility] for follow up.

## 2015-07-08 LAB — BETA 2 MICROGLOBULIN, SERUM: BETA 2 MICROGLOBULIN: 1.9 mg/L (ref 0.6–2.4)

## 2015-07-09 LAB — KAPPA/LAMBDA LIGHT CHAINS
KAPPA, LAMDA LIGHT CHAIN RATIO: 3.61 — AB (ref 0.26–1.65)
Kappa free light chain: 67.13 mg/L — ABNORMAL HIGH (ref 3.30–19.40)
LAMDA FREE LIGHT CHAINS: 18.59 mg/L (ref 5.71–26.30)

## 2015-07-11 LAB — PROTEIN ELECTROPHORESIS, SERUM
A/G RATIO SPE: 0.7 (ref 0.7–1.7)
ALBUMIN ELP: 3.5 g/dL (ref 2.9–4.4)
ALPHA-1-GLOBULIN: 0.2 g/dL (ref 0.0–0.4)
Alpha-2-Globulin: 0.6 g/dL (ref 0.4–1.0)
Beta Globulin: 1.2 g/dL (ref 0.7–1.3)
GAMMA GLOBULIN: 2.8 g/dL — AB (ref 0.4–1.8)
Globulin, Total: 4.8 g/dL — ABNORMAL HIGH (ref 2.2–3.9)
M-Spike, %: 2.3 g/dL — ABNORMAL HIGH
TOTAL PROTEIN ELP: 8.3 g/dL (ref 6.0–8.5)

## 2015-07-11 LAB — IMMUNOFIXATION ELECTROPHORESIS
IGG (IMMUNOGLOBIN G), SERUM: 570 mg/dL — AB (ref 700–1600)
IgA: 61 mg/dL (ref 61–437)
IgM, Serum: 3245 mg/dL — ABNORMAL HIGH (ref 15–143)
Total Protein ELP: 8.2 g/dL (ref 6.0–8.5)

## 2015-07-14 ENCOUNTER — Other Ambulatory Visit: Payer: Self-pay | Admitting: Internal Medicine

## 2015-07-17 ENCOUNTER — Other Ambulatory Visit: Payer: Self-pay

## 2015-07-17 MED ORDER — LOSARTAN POTASSIUM 100 MG PO TABS: 100.0000 mg | ORAL_TABLET | Freq: Every day | ORAL | Status: DC

## 2015-07-19 ENCOUNTER — Encounter: Payer: Self-pay | Admitting: Internal Medicine

## 2015-07-19 NOTE — Telephone Encounter (Signed)
If he is having increased sob, pulse varying and blood pressure elevated - he needs to be seen today (asap).  I will not be in the office the rest of the day.  He needs seen today.  He also sees cardiology.  Has known disease.  Either needs appt with them today or needs acute visit/or ER pending symptoms.

## 2015-07-20 NOTE — Telephone Encounter (Signed)
Please follow up and confirm with pt that he was evaluated.  Thanks

## 2015-07-22 NOTE — Progress Notes (Signed)
Troy Lopez  [phone] [phone]  ID: Troy Lopez OB: [birth year]  MR#: [id number]  OZY#:[id number]  Patient Care Team: Einar Pheasant, MD as PCP - General (Internal Medicine)  CHIEF COMPLAINT:  Chief Complaint  Patient presents with  . monoclonal gammopathy    last seen by Dr. Inez Pilgrim in 05/2014    INTERVAL HISTORY: Patient last evaluated in clinic approximately one year ago and is referred back for repeat laboratory work and further evaluation. He continues to feel well and remains asymptomatic. He has no neurologic complaints. He denies any fevers. He has no pain. He has good appetite and denies weight loss. He has no chest pain or shortness of breath. He denies any nausea, vomiting, constipation, or diarrhea. He has no urinary complaints. Patient feels at his baseline and offers no specific complaints today.  REVIEW OF SYSTEMS:   Review of Systems  Constitutional: Negative.   Respiratory: Negative.   Cardiovascular: Negative.   Gastrointestinal: Negative.   Musculoskeletal: Negative.   Neurological: Negative.     As per HPI. Otherwise, a complete review of systems is negatve.  PAST MEDICAL HISTORY: Past Medical History  Diagnosis Date  . Hypertension   . Hypercholesterolemia   . Irritable bowel syndrome   . GERD (gastroesophageal reflux disease)   . Colonic polyp   . Monoclonal gammopathy   . Hyperkalemia   . COPD (chronic obstructive pulmonary disease) ([place])   . Adrenal gland anomaly     enlargement  . Diverticulosis   . Depression   . Degenerative disc disease, lumbar   . Carpal tunnel syndrome     PAST SURGICAL HISTORY: Past Surgical History  Procedure Laterality Date  . Bunionectomy  1989  . Hemorrhoid surgery    . Cholecystectomy  09/07  . Eye surgery Bilateral 2010    cataract  . Carpal tunnel release Left 2011    ulnar nerve sub muscular at elbow    FAMILY HISTORY Family History  Problem Relation Age  of Onset  . Stroke Mother   . Heart disease Father     MI - 32   . Colon cancer Neg Hx   . Prostate cancer Neg Hx        ADVANCED DIRECTIVES:    HEALTH MAINTENANCE: Social History  Substance Use Topics  . Smoking status: Current Some Day Smoker    Types: Cigarettes  . Smokeless tobacco: Never Used     Comment: about 2 cigarettes per day, trying to quit  . Alcohol Use: 0.0 oz/week    0 Standard drinks or equivalent per week     Comment: occas     Colonoscopy:  PAP:  Bone density:  Lipid panel:  Allergies  Allergen Reactions  . 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 acetaminophen (TYLENOL) 325 MG tablet Take 650 mg by mouth every 6 (six) hours as needed for pain.    Marland Kitchen amLODipine (NORVASC) 5 MG tablet Take 1 tablet (5 mg total) by mouth daily. 90 tablet 3  . budesonide (ENTOCORT EC) 3 MG 24 hr capsule Take by mouth.    . EPINEPHrine (EPI-PEN) 0.3 mg/0.3 mL SOAJ injection Inject 0.3 mLs (0.3 mg total) into the muscle once. 1 Device 2  . fluticasone (FLONASE) 50 MCG/ACT nasal spray Place 2 sprays into both nostrils daily. 16 g 5  . furosemide (LASIX) 20 MG  tablet Take 1 tablet (20 mg total) by mouth daily as needed. 30 tablet 5  . gabapentin (NEURONTIN) 100 MG capsule Take two each morning, two each afternoon and three at bedtime 210 capsule 3  . loperamide (IMODIUM) 2 MG capsule Take 2 mg by mouth daily as needed.    . metoprolol (LOPRESSOR) 100 MG tablet Take 1 tablet (100 mg total) by mouth 2 (two) times daily. 60 tablet 3  . ondansetron (ZOFRAN) 8 MG tablet Take 8 mg by mouth every 8 (eight) hours as needed.    . pantoprazole (PROTONIX) 40 MG tablet Take 1 tablet (40 mg total) by mouth 2 (two) times daily. 180 tablet 1  . traMADol (ULTRAM) 50 MG tablet Take 1 tablet (50 mg total) by mouth every 6 (six) hours as needed. 120 tablet 1   . losartan (COZAAR) 100 MG tablet Take 1 tablet (100 mg total) by mouth daily. 90 tablet 1   No current facility-administered medications for this visit.    OBJECTIVE: Filed Vitals:   07/07/15 1056  BP: 181/99  Pulse: 60  Temp: 96.6 F (35.9 C)  Resp: 18     Body mass index is 27.81 kg/(m^2).    ECOG FS:0 - Asymptomatic  General: Well-developed, well-nourished, no acute distress. Eyes: Pink conjunctiva, anicteric sclera. HEENT: Normocephalic, moist mucous membranes, clear oropharnyx. Lungs: Clear to auscultation bilaterally. Heart: Regular rate and rhythm. No rubs, murmurs, or gallops. Abdomen: Soft, nontender, nondistended. No organomegaly noted, normoactive bowel sounds. Musculoskeletal: No edema, cyanosis, or clubbing. Neuro: Alert, answering all questions appropriately. Cranial nerves grossly intact. Skin: No rashes or petechiae noted. Psych: Normal affect. Lymphatics: No cervical, calvicular, axillary or inguinal LAD.   LAB RESULTS:  Lab Results  Component Value Date   NA 130* 07/07/2015   K 4.5 07/07/2015   CL 94* 07/07/2015   CO2 26 07/07/2015   GLUCOSE 114* 07/07/2015   BUN 9 07/07/2015   CREATININE 0.97 07/07/2015   CALCIUM 9.4 07/07/2015   PROT 8.6* 06/12/2015   ALBUMIN 3.7 06/12/2015   AST 14 06/12/2015   ALT 11 06/12/2015   ALKPHOS 52 06/12/2015   BILITOT 0.6 06/12/2015   GFRNONAA >60 07/07/2015   GFRAA >60 07/07/2015    Lab Results  Component Value Date   WBC 7.7 07/07/2015   NEUTROABS 5.9 06/27/2015   HGB 13.4 07/07/2015   HCT 38.9* 07/07/2015   MCV 99.8 07/07/2015   PLT 406 07/07/2015     STUDIES: No results found.  ASSESSMENT: MGUS.  PLAN:    1. MGUS:  Patient's M spike has trended up slightly from approximately 1.7 to 2.3. He continues to have no evidence of endorgan damage. Previously, full workup was negative.  It is possible patient has smoldering myeloma, but this would require a bone marrow biopsy to confirm which patient  does not wish to pursue at this time. Patient also does not wish routine follow-up at the [facility]. Continue to monitor CBC, metabolic panel, and SPEP every 6-12 months. If patient has evidence of endorgan damage or his M spike continues to trend up, please refer him back for further evaluation and consideration of bone marrow biopsy.  Patient expressed understanding and was in agreement with this plan. He also understands that He can call clinic at any time with any questions, concerns, or complaints.    Lloyd Huger, MD   07/22/2015 4:22 PM

## 2015-08-07 ENCOUNTER — Telehealth: Payer: Self-pay | Admitting: *Deleted

## 2015-08-07 NOTE — Telephone Encounter (Signed)
Notified patient that annual lung cancer screening low dose CT scan is due. Confirmed that patient is within the age range of 55-77, and asymptomatic, (no signs or symptoms of lung cancer). The patient is a current smoker, with a 56.5 pack year history. The shared decision making visit was done 05/20/14 Patient is agreeable for CT scan being scheduled.

## 2015-08-14 ENCOUNTER — Other Ambulatory Visit: Payer: Self-pay | Admitting: Internal Medicine

## 2015-08-17 ENCOUNTER — Other Ambulatory Visit: Payer: Self-pay | Admitting: Internal Medicine

## 2015-08-17 ENCOUNTER — Other Ambulatory Visit: Payer: Self-pay | Admitting: Family Medicine

## 2015-08-17 ENCOUNTER — Encounter: Payer: Self-pay | Admitting: Family Medicine

## 2015-08-17 ENCOUNTER — Telehealth: Payer: Self-pay | Admitting: *Deleted

## 2015-08-17 DIAGNOSIS — Z87891 Personal history of nicotine dependence: Secondary | ICD-10-CM | POA: Insufficient documentation

## 2015-08-17 HISTORY — DX: Personal history of nicotine dependence: Z87.891

## 2015-08-17 NOTE — Telephone Encounter (Signed)
Patient was called and told it was faxed.

## 2015-08-17 NOTE — Telephone Encounter (Signed)
error 

## 2015-08-17 NOTE — Telephone Encounter (Signed)
Patient has requested a medication refill for tramadol .

## 2015-08-17 NOTE — Telephone Encounter (Signed)
Prescription was already faxed.

## 2015-08-18 ENCOUNTER — Other Ambulatory Visit: Payer: Self-pay | Admitting: Family Medicine

## 2015-08-18 DIAGNOSIS — Z87891 Personal history of nicotine dependence: Secondary | ICD-10-CM

## 2015-08-24 ENCOUNTER — Telehealth: Payer: Self-pay | Admitting: Internal Medicine

## 2015-08-24 ENCOUNTER — Encounter: Payer: Self-pay | Admitting: Internal Medicine

## 2015-08-24 DIAGNOSIS — E7800 Pure hypercholesterolemia, unspecified: Secondary | ICD-10-CM | POA: Diagnosis not present

## 2015-08-24 DIAGNOSIS — J449 Chronic obstructive pulmonary disease, unspecified: Secondary | ICD-10-CM | POA: Diagnosis not present

## 2015-08-24 DIAGNOSIS — G4733 Obstructive sleep apnea (adult) (pediatric): Secondary | ICD-10-CM | POA: Diagnosis not present

## 2015-08-24 DIAGNOSIS — I10 Essential (primary) hypertension: Secondary | ICD-10-CM | POA: Diagnosis not present

## 2015-08-24 DIAGNOSIS — R0602 Shortness of breath: Secondary | ICD-10-CM

## 2015-08-24 DIAGNOSIS — Z9889 Other specified postprocedural states: Secondary | ICD-10-CM | POA: Diagnosis not present

## 2015-08-24 DIAGNOSIS — R0789 Other chest pain: Secondary | ICD-10-CM | POA: Diagnosis not present

## 2015-08-24 NOTE — Telephone Encounter (Signed)
Left msg to call office to schedule AWV/msn °

## 2015-08-25 NOTE — Telephone Encounter (Signed)
Order placed for pulmonary referral.  

## 2015-08-29 ENCOUNTER — Ambulatory Visit: Admission: RE | Admit: 2015-08-29 | Payer: PPO | Source: Ambulatory Visit

## 2015-08-29 ENCOUNTER — Ambulatory Visit: Payer: PPO

## 2015-09-12 ENCOUNTER — Encounter: Payer: Self-pay | Admitting: Internal Medicine

## 2015-09-18 ENCOUNTER — Other Ambulatory Visit: Payer: Self-pay | Admitting: Internal Medicine

## 2015-09-18 ENCOUNTER — Encounter: Payer: Self-pay | Admitting: Pulmonary Disease

## 2015-09-18 ENCOUNTER — Ambulatory Visit (INDEPENDENT_AMBULATORY_CARE_PROVIDER_SITE_OTHER): Payer: PPO | Admitting: Pulmonary Disease

## 2015-09-18 VITALS — BP 150/82 | HR 94 | Ht 71.0 in | Wt 198.0 lb

## 2015-09-18 DIAGNOSIS — R0600 Dyspnea, unspecified: Secondary | ICD-10-CM

## 2015-09-18 DIAGNOSIS — I272 Other secondary pulmonary hypertension: Secondary | ICD-10-CM | POA: Diagnosis not present

## 2015-09-18 DIAGNOSIS — I499 Cardiac arrhythmia, unspecified: Secondary | ICD-10-CM | POA: Diagnosis not present

## 2015-09-18 DIAGNOSIS — Z720 Tobacco use: Secondary | ICD-10-CM | POA: Diagnosis not present

## 2015-09-18 DIAGNOSIS — F17200 Nicotine dependence, unspecified, uncomplicated: Secondary | ICD-10-CM

## 2015-09-18 MED ORDER — UMECLIDINIUM BROMIDE 62.5 MCG/INH IN AEPB: 1.0000 | INHALATION_SPRAY | Freq: Every day | RESPIRATORY_TRACT | Status: AC

## 2015-09-18 MED ORDER — UMECLIDINIUM BROMIDE 62.5 MCG/INH IN AEPB: 1.0000 | INHALATION_SPRAY | Freq: Every day | RESPIRATORY_TRACT | Status: DC

## 2015-09-18 NOTE — Progress Notes (Signed)
PULMONARY CONSULT NOTE  Requesting MD/Service: Einar Pheasant, MD Date of initial consultation: 09/18/15 Reason for consultation: Dyspnea  PT PROFILE: 75 y.o. M smoker underwent initial evaluation for progressive dyspnea on 09/18/15 with finding of cardiac arrhythmia - probable atrial flutter with variable conduction and frequent PVCs. Initial impression: Likely COPD. Acute worsening of respiratory symptoms possibly due to cardiac arrhythmia. Discussed case with his cardiologist, Dr Saralyn Pilar  HPI:  75 y.o. M referred by Dr Nicki Reaper for further evaluation of increasing dyspnea. He reports baseline class 2 dyspnea until early Dec 2016. Since then, he has had increasing exertional dyspnea and occasionally dyspnea @ rest. He also notes occasional chest pain/tightness with exertion that resolves with rest. He has chronic, daily cough which his wife describes as rattling but which is mostly nonproductive. He denies fever, hemoptysis, pleurodynia, LE edema. He has undergone extensive cardiac evaluation by Dr Saralyn Pilar without a clear etiology to his worsening symptoms. He has been a heavy smoker in the past, up to 2 ppd. Presently, he has cut down to 2-3 cigs per day and is actively trying to quit.   Past Medical History  Diagnosis Date  . Hypertension   . Hypercholesterolemia   . Irritable bowel syndrome   . GERD (gastroesophageal reflux disease)   . Colonic polyp   . Monoclonal gammopathy   . Hyperkalemia   . COPD (chronic obstructive pulmonary disease) ([place])   . Adrenal gland anomaly     enlargement  . Diverticulosis   . Depression   . Degenerative disc disease, lumbar   . Carpal tunnel syndrome   . Personal history of tobacco use, presenting hazards to health 08/17/2015  . CAD (coronary artery disease)     Past Surgical History  Procedure Laterality Date  . Bunionectomy  1989  . Hemorrhoid surgery    . Cholecystectomy  09/07  . Eye surgery Bilateral 2010    cataract  . Carpal tunnel  release Left 2011    ulnar nerve sub muscular at elbow    MEDICATIONS: I have reviewed all medications and confirmed regimen as documented  Social History   Social History  . Marital Status: Married    Spouse Name: N/A  . Number of Children: 3  . Years of Education: N/A   Occupational History  . Not on file.   Social History Main Topics  . Smoking status: Current Every Day Smoker    Types: Cigarettes  . Smokeless tobacco: Never Used     Comment: about 2 cigarettes per day, trying to quit  . Alcohol Use: 0.0 oz/week    0 Standard drinks or equivalent per week     Comment: occas  . Drug Use: No  . Sexual Activity: Not on file   Other Topics Concern  . Not on file   Social History Narrative    Family History  Problem Relation Age of Onset  . Stroke Mother   . Heart disease Father     MI - 36   . Colon cancer Neg Hx   . Prostate cancer Neg Hx     ROS: No fever, myalgias/arthralgias, unexplained weight loss or weight gain No new focal weakness or sensory deficits No otalgia, hearing loss, visual changes, nasal and sinus symptoms, mouth and throat problems No neck pain or adenopathy No abdominal pain, N/V/D, diarrhea, change in bowel pattern No dysuria, change in urinary pattern No LE edema or calf tenderness   Filed Vitals:   09/18/15 1535  BP: 150/82  Pulse:  94  Height: 5\' 11"  (1.803 m)  Weight: 198 lb (89.812 kg)  SpO2: 95%     EXAM:  Gen: WDWN, No overt respiratory distress HEENT: NCAT, TMs and canals normal, sclera white, nares and nasal mucosa normal, oropharynx normal Neck: Supple without LAN, thyromegaly, JVD Lungs: breath sounds mildly diminished, percussion note normal throughout, No adventitious sounds Cardiovascular: IRIR, rate 90-100/min, no murmurs noted Abdomen: Soft, nontender, normal BS Ext: without clubbing, cyanosis, edema Neuro: CNs grossly intact, motor and sensory intact, DTRs symmetric Skin: Limited exam, no lesions  noted  DATA:   BMP Latest Ref Rng 07/07/2015 06/12/2015 11/09/2014  Glucose 65 - 99 mg/dL 114(H) 102(H) 85  BUN 6 - 20 mg/dL 9 12 17   Creatinine 0.61 - 1.24 mg/dL 0.97 1.09 1.11  Sodium 135 - 145 mmol/L 130(L) 129(L) 129(L)  Potassium 3.5 - 5.1 mmol/L 4.5 4.8 4.8  Chloride 101 - 111 mmol/L 94(L) 94(L) 97  CO2 22 - 32 mmol/L 26 28 27   Calcium 8.9 - 10.3 mg/dL 9.4 10.2 9.9    CBC Latest Ref Rng 07/07/2015 06/27/2015 06/12/2015  WBC 3.8 - 10.6 K/uL 7.7 9.1 8.0  Hemoglobin 13.0 - 18.0 g/dL 13.4 13.5 13.6  Hematocrit 40.0 - 52.0 % 38.9(L) 40.7 40.7  Platelets 150 - 440 K/uL 406 436.0(H) 419.0(H)   NM stress 08/17/15:  1. Equivocal ETT 2. Normal left ventricular function 3. Normal wall motion 4. No evidence for scar or ischemia   TTE 08/02/15:  NORMAL LEFT VENTRICULAR SYSTOLIC FUNCTION (LVEF A999333) WITH MODERATE LVH MODERATE VALVULAR REGURGITATION (See above) NO VALVULAR STENOSIS MODERATE PHTN (PASP est 57 mmHg) LA moderately dilated  EKG 09/18/15: underlying rhythm appears to be atrial flutter with variable conduction. freq wide complex beats - ventricular ectopy vs aberrant  conduction  CXR (11/22/13):  No acute disease. Perhaps mild reduction in lung markings suggestive of early emphysema  IMPRESSION:   1) Smoker - previously heavy. Presently 2-3 cigs per day. Actively trying to quit 2) Chronic dyspnea - suspect COPD 3) Cardiac arrhythmia - likely atrial flutter. This is a new finding on the day of this visit 4) Pulmonary hypertension noted on TTE - likely group 2/3 (see below) 5)  Increasing dyspnea over past 2 months - suspect related to cardiac arrhythmia superimposed on underlying COPD.   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 He is to follow up with patient re: further eval of cardiac arrhythmia 2) Counseled re: smoking cessation 3) Begin Incruse inhaler - sample provided and Rx sent to pharmacy 4) ROV 10/09/15 with PFTs and CXR   Wilhelmina [name], MD [facility]

## 2015-09-18 NOTE — Patient Instructions (Signed)
Smoking cessation - consider nicotine replacement products - 2 mg gum or lozenges if unable to quit Incruse inhaler - one inhalation daily  Follow up 10/09/15 with pulmonary function tests and CXR prior to that visit

## 2015-09-18 NOTE — Telephone Encounter (Signed)
Last OV: 05/26/15. Next appt on: 09/26/15. Okay to refill?

## 2015-09-19 DIAGNOSIS — R9431 Abnormal electrocardiogram [ECG] [EKG]: Secondary | ICD-10-CM | POA: Diagnosis not present

## 2015-09-19 NOTE — Telephone Encounter (Signed)
ok'd  Refill for tramadol #120 with one refill and metoprolol #60 with 3 refills.

## 2015-09-20 ENCOUNTER — Encounter: Payer: Self-pay | Admitting: Pulmonary Disease

## 2015-09-20 NOTE — Telephone Encounter (Signed)
Rx faxed to [facility].  

## 2015-09-22 ENCOUNTER — Ambulatory Visit (INDEPENDENT_AMBULATORY_CARE_PROVIDER_SITE_OTHER): Payer: PPO | Admitting: *Deleted

## 2015-09-22 DIAGNOSIS — F17200 Nicotine dependence, unspecified, uncomplicated: Secondary | ICD-10-CM

## 2015-09-22 DIAGNOSIS — Z720 Tobacco use: Secondary | ICD-10-CM

## 2015-09-22 DIAGNOSIS — R0602 Shortness of breath: Secondary | ICD-10-CM | POA: Diagnosis not present

## 2015-09-22 LAB — PULMONARY FUNCTION TEST
DL/VA % pred: 47 %
DL/VA: 2.2 ml/min/mmHg/L
DLCO UNC % PRED: 41 %
DLCO unc: 14.14 ml/min/mmHg
FEF 25-75 PRE: 1 L/s
FEF 25-75 Post: 0.88 L/sec
FEF2575-%CHANGE-POST: -11 %
FEF2575-%PRED-PRE: 41 %
FEF2575-%Pred-Post: 36 %
FEV1-%Change-Post: 0 %
FEV1-%PRED-POST: 63 %
FEV1-%Pred-Pre: 64 %
FEV1-POST: 2.08 L
FEV1-PRE: 2.1 L
FEV1FVC-%CHANGE-POST: 1 %
FEV1FVC-%Pred-Pre: 77 %
FEV6-%CHANGE-POST: -1 %
FEV6-%PRED-POST: 85 %
FEV6-%PRED-PRE: 86 %
FEV6-POST: 3.57 L
FEV6-PRE: 3.63 L
FEV6FVC-%Change-Post: 0 %
FEV6FVC-%PRED-POST: 104 %
FEV6FVC-%PRED-PRE: 104 %
FVC-%CHANGE-POST: -2 %
FVC-%Pred-Post: 81 %
FVC-%Pred-Pre: 83 %
FVC-POST: 3.63 L
FVC-Pre: 3.71 L
POST FEV6/FVC RATIO: 99 %
PRE FEV1/FVC RATIO: 57 %
Post FEV1/FVC ratio: 57 %
Pre FEV6/FVC Ratio: 98 %
RV % PRED: 91 %
RV: 2.36 L
TLC % PRED: 90 %
TLC: 6.53 L

## 2015-09-22 NOTE — Progress Notes (Signed)
PFT performed today. 

## 2015-09-26 ENCOUNTER — Ambulatory Visit (INDEPENDENT_AMBULATORY_CARE_PROVIDER_SITE_OTHER): Payer: PPO | Admitting: Internal Medicine

## 2015-09-26 ENCOUNTER — Encounter: Payer: Self-pay | Admitting: Internal Medicine

## 2015-09-26 VITALS — BP 118/80 | HR 77 | Temp 97.5°F | Resp 18 | Ht 71.0 in | Wt 197.2 lb

## 2015-09-26 DIAGNOSIS — R0602 Shortness of breath: Secondary | ICD-10-CM

## 2015-09-26 DIAGNOSIS — I10 Essential (primary) hypertension: Secondary | ICD-10-CM | POA: Diagnosis not present

## 2015-09-26 DIAGNOSIS — M549 Dorsalgia, unspecified: Secondary | ICD-10-CM

## 2015-09-26 DIAGNOSIS — G8929 Other chronic pain: Secondary | ICD-10-CM

## 2015-09-26 DIAGNOSIS — Z87891 Personal history of nicotine dependence: Secondary | ICD-10-CM

## 2015-09-26 DIAGNOSIS — G4730 Sleep apnea, unspecified: Secondary | ICD-10-CM

## 2015-09-26 DIAGNOSIS — I2510 Atherosclerotic heart disease of native coronary artery without angina pectoris: Secondary | ICD-10-CM

## 2015-09-26 DIAGNOSIS — G479 Sleep disorder, unspecified: Secondary | ICD-10-CM | POA: Diagnosis not present

## 2015-09-26 DIAGNOSIS — E871 Hypo-osmolality and hyponatremia: Secondary | ICD-10-CM

## 2015-09-26 DIAGNOSIS — D472 Monoclonal gammopathy: Secondary | ICD-10-CM

## 2015-09-26 DIAGNOSIS — K52831 Collagenous colitis: Secondary | ICD-10-CM

## 2015-09-26 DIAGNOSIS — R0981 Nasal congestion: Secondary | ICD-10-CM

## 2015-09-26 DIAGNOSIS — K219 Gastro-esophageal reflux disease without esophagitis: Secondary | ICD-10-CM

## 2015-09-26 DIAGNOSIS — D649 Anemia, unspecified: Secondary | ICD-10-CM

## 2015-09-26 MED ORDER — ZOLPIDEM TARTRATE 5 MG PO TABS: ORAL_TABLET | ORAL | Status: DC

## 2015-09-26 NOTE — Progress Notes (Signed)
Pre-visit discussion using our clinic review tool. No additional management support is needed unless otherwise documented below in the visit note.  

## 2015-09-26 NOTE — Patient Instructions (Signed)
Saline nasal spray - flush nose at least 2-3x/day  Continue flonase  mucinex - one per day

## 2015-09-26 NOTE — Progress Notes (Signed)
Patient ID: ZO DINOVO, male   DOB: 1941/01/30, 75 y.o.   MRN: [id number]   Subjective:    Patient ID: Troy Lopez, male    DOB: [birth year], 75 y.o.   MRN: [id number]  HPI  Patient with past history of hypercholesterolemia, GERD, hypertension, monoclonal gammopathy and CAD.  He comes in today to follow up on these issues.  He is not feeling well.  Having trouble sleeping.  Increased fatigue.  Wakes up frequently.  Daytime somnolence.  When he lies flat, unable to get his breath -at times.  He just saw cardiology.  Had stress test 08/17/15 - negative for ischemia.  ECHO 08/02/15 - EF 50% with moderate MR/TR.  Saw Dr Alva Garnet.  See his note for details.  Found to be in atrial flutter.  Wore a holter monitor.  Does not know results.  Was placed on inhaler (Incruse).  Felt did not help.  Has f/u planned 10/09/15.  Is eating.  No nausea or vomiting.  Bowels stable.  Still smoking.  Discussed the need to quit.    Past Medical History  Diagnosis Date  . Hypertension   . Hypercholesterolemia   . Irritable bowel syndrome   . GERD (gastroesophageal reflux disease)   . Colonic polyp   . Monoclonal gammopathy   . Hyperkalemia   . COPD (chronic obstructive pulmonary disease) ([place])   . Adrenal gland anomaly     enlargement  . Diverticulosis   . Depression   . Degenerative disc disease, lumbar   . Carpal tunnel syndrome   . Personal history of tobacco use, presenting hazards to health 08/17/2015  . CAD (coronary artery disease)    Past Surgical History  Procedure Laterality Date  . Bunionectomy  1989  . Hemorrhoid surgery    . Cholecystectomy  09/07  . Eye surgery Bilateral 2010    cataract  . Carpal tunnel release Left 2011    ulnar nerve sub muscular at elbow   Family History  Problem Relation Age of Onset  . Stroke Mother   . Heart disease Father     MI - 33   . Colon cancer Neg Hx   . Prostate cancer Neg Hx    Social History   Social History  . Marital Status: Married   Spouse Name: N/A  . Number of Children: 3  . Years of Education: N/A   Social History Main Topics  . Smoking status: Current Every Day Smoker    Types: Cigarettes  . Smokeless tobacco: Never Used     Comment: about 2 cigarettes per day, trying to quit  . Alcohol Use: 0.0 oz/week    0 Standard drinks or equivalent per week     Comment: occas  . Drug Use: No  . Sexual Activity: Not Asked   Other Topics Concern  . None   Social History Narrative    Outpatient Encounter Prescriptions as of 09/26/2015  Medication Sig  . acetaminophen (TYLENOL) 325 MG tablet Take 650 mg by mouth every 6 (six) hours as needed for pain.  Marland Kitchen amLODipine (NORVASC) 5 MG tablet Take 1 tablet (5 mg total) by mouth daily.  . budesonide (ENTOCORT EC) 3 MG 24 hr capsule Take by mouth.  . cloNIDine (CATAPRES) 0.1 MG tablet Take by mouth.  . EPINEPHrine (EPI-PEN) 0.3 mg/0.3 mL SOAJ injection Inject 0.3 mLs (0.3 mg total) into the muscle once.  . fluticasone (FLONASE) 50 MCG/ACT nasal spray Place 2 sprays into both nostrils daily.  Marland Kitchen  furosemide (LASIX) 20 MG tablet Take 1 tablet (20 mg total) by mouth daily as needed.  . gabapentin (NEURONTIN) 100 MG capsule Take two each morning, two each afternoon and three at bedtime  . isosorbide mononitrate (IMDUR) 30 MG 24 hr tablet Take by mouth.  . loperamide (IMODIUM) 2 MG capsule Take 2 mg by mouth daily as needed.  Marland Kitchen losartan (COZAAR) 100 MG tablet Take 1 tablet (100 mg total) by mouth daily.  . metoprolol (LOPRESSOR) 100 MG tablet Take 1 tablet (100 mg total) by mouth 2 (two) times daily.  . ondansetron (ZOFRAN) 8 MG tablet Take 8 mg by mouth every 8 (eight) hours as needed.  . pantoprazole (PROTONIX) 40 MG tablet Take 1 tablet (40 mg total) by mouth 2 (two) times daily.  . traMADol (ULTRAM) 50 MG tablet TAKE (1) TABLET EVERY SIX HOURS AS NEEDED.  Marland Kitchen Umeclidinium Bromide (INCRUSE ELLIPTA) 62.5 MCG/INH AEPB Inhale 1 puff into the lungs daily.  Marland Kitchen zolpidem (AMBIEN) 5 MG  tablet Take one tablet before sleep study.  May repeat x 1 at study if needed.   No facility-administered encounter medications on file as of 09/26/2015.    Review of Systems  Constitutional: Positive for fatigue. Negative for unexpected weight change.  HENT: Positive for congestion. Negative for sinus pressure.   Respiratory: Positive for cough and shortness of breath. Negative for chest tightness.   Cardiovascular: Negative for chest pain, palpitations and leg swelling.  Gastrointestinal: Negative for nausea, vomiting, abdominal pain and diarrhea.  Genitourinary: Negative for dysuria and difficulty urinating.  Musculoskeletal: Positive for back pain. Negative for joint swelling.  Skin: Negative for color change and rash.  Neurological: Negative for dizziness, light-headedness and headaches.  Psychiatric/Behavioral: Positive for sleep disturbance. Negative for dysphoric mood and agitation.       Objective:     Blood pressure rechecked by me:  140/78  Physical Exam  Constitutional: He appears well-developed and well-nourished. No distress.  HENT:  Nose: Nose normal.  Mouth/Throat: Oropharynx is clear and moist.  Eyes: Conjunctivae are normal. Right eye exhibits no discharge. Left eye exhibits no discharge.  Neck: Neck supple. No thyromegaly present.  Cardiovascular: Normal rate and regular rhythm.   Pulmonary/Chest: Effort normal and breath sounds normal. No respiratory distress.  Abdominal: Soft. Bowel sounds are normal. There is no tenderness.  Musculoskeletal: He exhibits no edema or tenderness.  Lymphadenopathy:    He has no cervical adenopathy.  Skin: No rash noted. No erythema.  Psychiatric: He has a normal mood and affect. His behavior is normal.    BP 118/80 mmHg  Pulse 77  Temp(Src) 97.5 F (36.4 C) (Oral)  Resp 18  Ht 5\' 11"  (1.803 m)  Wt 197 lb 4 oz (89.472 kg)  BMI 27.52 kg/m2  SpO2 96% Wt Readings from Last 3 Encounters:  09/26/15 197 lb 4 oz (89.472 kg)    09/18/15 198 lb (89.812 kg)  07/07/15 193 lb 12.6 oz (87.9 kg)     Lab Results  Component Value Date   WBC 7.7 07/07/2015   HGB 13.4 07/07/2015   HCT 38.9* 07/07/2015   PLT 406 07/07/2015   GLUCOSE 114* 07/07/2015   CHOL 171 06/12/2015   TRIG 72.0 06/12/2015   HDL 77.10 06/12/2015   LDLCALC 80 06/12/2015   ALT 11 06/12/2015   AST 14 06/12/2015   NA 130* 07/07/2015   K 4.5 07/07/2015   CL 94* 07/07/2015   CREATININE 0.97 07/07/2015   BUN 9 07/07/2015  CO2 26 07/07/2015   TSH 1.75 06/12/2015   PSA 1.24 11/09/2014       Assessment & Plan:   Problem List Items Addressed This Visit    Anemia    Has had extensive GI w/up.  Follow cbc.  Has seen Dr Grayland Ormond.       CAD (coronary artery disease)    Recently evaluated by cardiology.  ECHO as outlined.  Stress test negative for ischemia.  Still with sob.  Saw Dr Alva Garnet.  Did not feel had improvement with inhaler.  Difficulty sleeping and issues as outlined.  PAP pressure increased.  Obtain split night sleep study.  Keep f/u with cardiology.  Await holter results.        Chronic back pain    Stable on tramadol.       Collagenous colitis    Colonoscopy 08/25/14.  Bowels doing better currently.        GERD (gastroesophageal reflux disease)    EGD as outlined in overview.  Continue on protonix.  Symptoms controlled.       Hypertension    Blood pressure under reasonable control.  Continue same medication regimen.  Follow pressures.  Follow metabolic panel.        Hyponatremia    Per nephrology - may have some factitious hyponatremia.  Follow sodium.  Has been stable.  Have discussed the need for decreased alcohol intake.       Monoclonal gammopathy    Saw Dr Grayland Ormond.  Stable.  Recommended f/u SIEP q 6-12 months.        Nasal congestion    Nasal congestion and cough.  Saline nasal spray and nasacort as directed.  Mucinex.  Follow.  Keep f/u with pulmonary.       Personal history of tobacco use, presenting hazards  to health    Discussed the need to quit.        Sleep apnea - Primary    Not using his mask.  Has been years since had sleep study.  With current symptoms, needs reevaluation.       Relevant Orders   Ambulatory referral to Sleep Studies   Sleep difficulties    Discussed at length.  SOB with lying flat.  I feel his sleep issues are significantly affecting his symptoms throughout the day.  Wakes frequently.  Daytime somnolence.  Elevated PAP.  Obtain split night sleep study.        Relevant Orders   Ambulatory referral to Sleep Studies   SOB (shortness of breath)    Persistent.  Had cardiac w/up recently as outlined.  Saw Dr Alva Garnet.  No improvement with inhaler use.  Found to be in [place].  holter results pending.  Difficulty when lying flat and sleep issues as outlined.  Obtain split night sleep study.        Relevant Orders   Ambulatory referral to Sleep Studies       Einar Pheasant, MD

## 2015-09-28 ENCOUNTER — Encounter: Payer: Self-pay | Admitting: Internal Medicine

## 2015-09-28 DIAGNOSIS — R0981 Nasal congestion: Secondary | ICD-10-CM | POA: Insufficient documentation

## 2015-09-28 DIAGNOSIS — I4892 Unspecified atrial flutter: Secondary | ICD-10-CM | POA: Diagnosis not present

## 2015-09-28 NOTE — Assessment & Plan Note (Signed)
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  Follow sodium.  Has been stable.  Have discussed the need for decreased alcohol intake.

## 2015-09-28 NOTE — Assessment & Plan Note (Signed)
Discussed at length.  SOB with lying flat.  I feel his sleep issues are significantly affecting his symptoms throughout the day.  Wakes frequently.  Daytime somnolence.  Elevated PAP.  Obtain split night sleep study.

## 2015-09-28 NOTE — Assessment & Plan Note (Signed)
Colonoscopy 08/25/14.  Bowels doing better currently.

## 2015-09-28 NOTE — Assessment & Plan Note (Signed)
Has had extensive GI w/up.  Follow cbc.  Has seen Dr Grayland Ormond.

## 2015-09-28 NOTE — Assessment & Plan Note (Signed)
Saw Dr Grayland Ormond.  Stable.  Recommended f/u SIEP q 6-12 months.

## 2015-09-28 NOTE — Assessment & Plan Note (Signed)
Not using his mask.  Has been years since had sleep study.  With current symptoms, needs reevaluation.

## 2015-09-28 NOTE — Assessment & Plan Note (Signed)
EGD as outlined in overview.  Continue on protonix.  Symptoms controlled.

## 2015-09-28 NOTE — Assessment & Plan Note (Signed)
Discussed the need to quit.   

## 2015-09-28 NOTE — Assessment & Plan Note (Signed)
Stable on tramadol. 

## 2015-09-28 NOTE — Assessment & Plan Note (Addendum)
Recently evaluated by cardiology.  ECHO as outlined.  Stress test negative for ischemia.  Still with sob.  Saw Dr Alva Garnet.  Did not feel had improvement with inhaler.  Difficulty sleeping and issues as outlined.  PAP pressure increased.  Obtain split night sleep study.  Keep f/u with cardiology.  Await holter results.

## 2015-09-28 NOTE — Assessment & Plan Note (Signed)
Blood pressure under reasonable control.  Continue same medication regimen.  Follow pressures.  Follow metabolic panel.   

## 2015-09-28 NOTE — Assessment & Plan Note (Signed)
Nasal congestion and cough.  Saline nasal spray and nasacort as directed.  Mucinex.  Follow.  Keep f/u with pulmonary.

## 2015-09-28 NOTE — Assessment & Plan Note (Signed)
Persistent.  Had cardiac w/up recently as outlined.  Saw Dr Alva Garnet.  No improvement with inhaler use.  Found to be in [place].  holter results pending.  Difficulty when lying flat and sleep issues as outlined.  Obtain split night sleep study.

## 2015-10-02 ENCOUNTER — Encounter: Payer: Self-pay | Admitting: Internal Medicine

## 2015-10-04 ENCOUNTER — Encounter: Payer: Self-pay | Admitting: Internal Medicine

## 2015-10-04 DIAGNOSIS — J449 Chronic obstructive pulmonary disease, unspecified: Secondary | ICD-10-CM | POA: Diagnosis not present

## 2015-10-04 DIAGNOSIS — I10 Essential (primary) hypertension: Secondary | ICD-10-CM | POA: Diagnosis not present

## 2015-10-04 DIAGNOSIS — I2510 Atherosclerotic heart disease of native coronary artery without angina pectoris: Secondary | ICD-10-CM | POA: Diagnosis not present

## 2015-10-04 DIAGNOSIS — G4733 Obstructive sleep apnea (adult) (pediatric): Secondary | ICD-10-CM | POA: Diagnosis not present

## 2015-10-04 NOTE — Telephone Encounter (Signed)
Sounds like he needs to be seen again.  I can work him in to discuss treatment at 12:00 tomorrow.

## 2015-10-05 ENCOUNTER — Ambulatory Visit (INDEPENDENT_AMBULATORY_CARE_PROVIDER_SITE_OTHER): Payer: PPO | Admitting: Internal Medicine

## 2015-10-05 ENCOUNTER — Encounter: Payer: Self-pay | Admitting: Internal Medicine

## 2015-10-05 VITALS — BP 110/70 | HR 69 | Temp 98.1°F | Resp 12 | Ht 71.0 in | Wt 191.2 lb

## 2015-10-05 DIAGNOSIS — I10 Essential (primary) hypertension: Secondary | ICD-10-CM | POA: Diagnosis not present

## 2015-10-05 DIAGNOSIS — G479 Sleep disorder, unspecified: Secondary | ICD-10-CM | POA: Diagnosis not present

## 2015-10-05 DIAGNOSIS — I2510 Atherosclerotic heart disease of native coronary artery without angina pectoris: Secondary | ICD-10-CM | POA: Diagnosis not present

## 2015-10-05 DIAGNOSIS — G8929 Other chronic pain: Secondary | ICD-10-CM

## 2015-10-05 DIAGNOSIS — R0602 Shortness of breath: Secondary | ICD-10-CM | POA: Diagnosis not present

## 2015-10-05 DIAGNOSIS — Z87891 Personal history of nicotine dependence: Secondary | ICD-10-CM

## 2015-10-05 DIAGNOSIS — M549 Dorsalgia, unspecified: Secondary | ICD-10-CM

## 2015-10-05 DIAGNOSIS — E871 Hypo-osmolality and hyponatremia: Secondary | ICD-10-CM

## 2015-10-05 DIAGNOSIS — R0981 Nasal congestion: Secondary | ICD-10-CM

## 2015-10-05 MED ORDER — DULOXETINE HCL 30 MG PO CPEP: 30.0000 mg | ORAL_CAPSULE | Freq: Every day | ORAL | Status: DC

## 2015-10-05 NOTE — Progress Notes (Signed)
Patient ID: Troy Lopez, male   DOB: [birth year], 75 y.o.   MRN: [id number]   Subjective:    Patient ID: Troy Lopez, male    DOB: [birth year], 75 y.o.   MRN: [id number]  HPI  Patient with past history of hypercholesterolemia, GERD, IBS, CAD, depression and hypertension.  He comes in today as a work in with concerns regarding increased fatigue, sob and just not feeling well.  He was recently evaluated and concern raised for sleep apnea.  Had his sleep study last night.  Has recently seen cardiology.  See their note for details.  He also recently saw Dr Alva Garnet.  Inhaler did not help.  Increased stress, depression and anxiety.  No suicidal ideations.  Feels he needs something to help calm him down.  Not sleeping.     Past Medical History  Diagnosis Date  . Hypertension   . Hypercholesterolemia   . Irritable bowel syndrome   . GERD (gastroesophageal reflux disease)   . Colonic polyp   . Monoclonal gammopathy   . Hyperkalemia   . COPD (chronic obstructive pulmonary disease) ([place])   . Adrenal gland anomaly     enlargement  . Diverticulosis   . Depression   . Degenerative disc disease, lumbar   . Carpal tunnel syndrome   . Personal history of tobacco use, presenting hazards to health 08/17/2015  . CAD (coronary artery disease)    Past Surgical History  Procedure Laterality Date  . Bunionectomy  1989  . Hemorrhoid surgery    . Cholecystectomy  09/07  . Eye surgery Bilateral 2010    cataract  . Carpal tunnel release Left 2011    ulnar nerve sub muscular at elbow   Family History  Problem Relation Age of Onset  . Stroke Mother   . Heart disease Father     MI - 52   . Colon cancer Neg Hx   . Prostate cancer Neg Hx    Social History   Social History  . Marital Status: Married    Spouse Name: N/A  . Number of Children: 3  . Years of Education: N/A   Social History Main Topics  . Smoking status: Current Every Day Smoker    Types: Cigarettes  . Smokeless tobacco:  Never Used     Comment: about 2 cigarettes per day, trying to quit  . Alcohol Use: 0.0 oz/week    0 Standard drinks or equivalent per week     Comment: occas  . Drug Use: No  . Sexual Activity: Not Asked   Other Topics Concern  . None   Social History Narrative    Outpatient Encounter Prescriptions as of 10/05/2015  Medication Sig  . acetaminophen (TYLENOL) 325 MG tablet Take 650 mg by mouth every 6 (six) hours as needed for pain.  Marland Kitchen amLODipine (NORVASC) 5 MG tablet Take 1 tablet (5 mg total) by mouth daily.  . budesonide (ENTOCORT EC) 3 MG 24 hr capsule Take by mouth.  . cloNIDine (CATAPRES) 0.1 MG tablet Take by mouth.  . fluticasone (FLONASE) 50 MCG/ACT nasal spray Place 2 sprays into both nostrils daily.  . furosemide (LASIX) 20 MG tablet Take 1 tablet (20 mg total) by mouth daily as needed.  . gabapentin (NEURONTIN) 100 MG capsule Take two each morning, two each afternoon and three at bedtime  . isosorbide mononitrate (IMDUR) 30 MG 24 hr tablet Take by mouth.  . loperamide (IMODIUM) 2 MG capsule Take 2 mg by mouth daily as  needed.  Marland Kitchen losartan (COZAAR) 100 MG tablet Take 1 tablet (100 mg total) by mouth daily.  . metoprolol (LOPRESSOR) 100 MG tablet Take 1 tablet (100 mg total) by mouth 2 (two) times daily.  . ondansetron (ZOFRAN) 8 MG tablet Take 8 mg by mouth every 8 (eight) hours as needed.  . pantoprazole (PROTONIX) 40 MG tablet Take 1 tablet (40 mg total) by mouth 2 (two) times daily.  . traMADol (ULTRAM) 50 MG tablet TAKE (1) TABLET EVERY SIX HOURS AS NEEDED.  Marland Kitchen Umeclidinium Bromide (INCRUSE ELLIPTA) 62.5 MCG/INH AEPB Inhale 1 puff into the lungs daily.  Marland Kitchen zolpidem (AMBIEN) 5 MG tablet Take one tablet before sleep study.  May repeat x 1 at study if needed.  . DULoxetine (CYMBALTA) 30 MG capsule Take 1 capsule (30 mg total) by mouth daily.  Marland Kitchen EPINEPHrine (EPI-PEN) 0.3 mg/0.3 mL SOAJ injection Inject 0.3 mLs (0.3 mg total) into the muscle once. (Patient not taking: Reported on  10/05/2015)   No facility-administered encounter medications on file as of 10/05/2015.    Review of Systems  Constitutional: Positive for fatigue. Negative for fever.  HENT: Negative for congestion and sinus pressure.   Respiratory: Positive for shortness of breath. Negative for cough and chest tightness.   Cardiovascular: Negative for chest pain, palpitations and leg swelling.  Gastrointestinal: Negative for nausea, vomiting and abdominal pain.  Genitourinary: Negative for dysuria and difficulty urinating.  Musculoskeletal: Positive for back pain. Negative for joint swelling.  Skin: Negative for color change and rash.  Neurological: Negative for dizziness, light-headedness and headaches.  Psychiatric/Behavioral:       Increased stress, anxiety and depression.         Objective:    Physical Exam  Constitutional: He appears well-developed and well-nourished. No distress.  HENT:  Nose: Nose normal.  Mouth/Throat: Oropharynx is clear and moist.  Neck: Neck supple. No thyromegaly present.  Cardiovascular: Normal rate and regular rhythm.   Pulmonary/Chest: Effort normal and breath sounds normal. No respiratory distress.  Abdominal: Soft. Bowel sounds are normal. There is no tenderness.  Musculoskeletal: He exhibits no edema or tenderness.  Lymphadenopathy:    He has no cervical adenopathy.  Skin: No rash noted. No erythema.  Psychiatric: He has a normal mood and affect. His behavior is normal.    BP 110/70 mmHg  Pulse 69  Temp(Src) 98.1 F (36.7 C) (Oral)  Resp 12  Ht 5\' 11"  (1.803 m)  Wt 191 lb 3.2 oz (86.728 kg)  BMI 26.68 kg/m2  SpO2 99% Wt Readings from Last 3 Encounters:  10/05/15 191 lb 3.2 oz (86.728 kg)  09/26/15 197 lb 4 oz (89.472 kg)  09/18/15 198 lb (89.812 kg)     Lab Results  Component Value Date   WBC 7.7 07/07/2015   HGB 13.4 07/07/2015   HCT 38.9* 07/07/2015   PLT 406 07/07/2015   GLUCOSE 114* 07/07/2015   CHOL 171 06/12/2015   TRIG 72.0  06/12/2015   HDL 77.10 06/12/2015   LDLCALC 80 06/12/2015   ALT 11 06/12/2015   AST 14 06/12/2015   NA 130* 07/07/2015   K 4.5 07/07/2015   CL 94* 07/07/2015   CREATININE 0.97 07/07/2015   BUN 9 07/07/2015   CO2 26 07/07/2015   TSH 1.75 06/12/2015   PSA 1.24 11/09/2014       Assessment & Plan:   Problem List Items Addressed This Visit    CAD (coronary artery disease)    Recently evaluated by cardiology.  Negative  stress test.  Still with sob.  Seeing Dr Alva Garnet.  Inhaler did not help.  Sleep study last night.  Need results.  Keep f/u with pulmonary and cardiology.        Chronic back pain    On tramadol.  Decrease tramadol to bid and try to taper as starts cymbalta and dose titrated.        Hypertension - Primary    Blood pressure is doing well.  Follow pressures.  Follow metabolic panel.        Hyponatremia    Stable.  See previous note.        Nasal congestion    Better with saline nasal spray and nasacort.  Follow.        Personal history of tobacco use, presenting hazards to health    Discussed with him today the need to quit smoking.        Sleep difficulties    Obtain sleep study results.  Increased stress and anxiety as outlined.  Discussed treatment options.  Avoid sedatives.  Start cymbalta 30mg  q day.  Schedule f/u soon to reassess.        SOB (shortness of breath)    Persistent sob.  Evaluated recently by cardiology and pulmonary.  Inhaler did not help.  Sleep study last night.  Obtain results.            Einar Pheasant, MD

## 2015-10-05 NOTE — Progress Notes (Signed)
Pre visit review using our clinic review tool, if applicable. No additional management support is needed unless otherwise documented below in the visit note. 

## 2015-10-08 ENCOUNTER — Encounter: Payer: Self-pay | Admitting: Internal Medicine

## 2015-10-08 NOTE — Assessment & Plan Note (Signed)
Recently evaluated by cardiology.  Negative stress test.  Still with sob.  Seeing Dr Alva Garnet.  Inhaler did not help.  Sleep study last night.  Need results.  Keep f/u with pulmonary and cardiology.

## 2015-10-08 NOTE — Assessment & Plan Note (Signed)
Better with saline nasal spray and nasacort.  Follow.

## 2015-10-08 NOTE — Assessment & Plan Note (Signed)
Stable.  See previous note.

## 2015-10-08 NOTE — Assessment & Plan Note (Signed)
Obtain sleep study results.  Increased stress and anxiety as outlined.  Discussed treatment options.  Avoid sedatives.  Start cymbalta 30mg  q day.  Schedule f/u soon to reassess.

## 2015-10-08 NOTE — Assessment & Plan Note (Signed)
Persistent sob.  Evaluated recently by cardiology and pulmonary.  Inhaler did not help.  Sleep study last night.  Obtain results.

## 2015-10-08 NOTE — Assessment & Plan Note (Signed)
On tramadol.  Decrease tramadol to bid and try to taper as starts cymbalta and dose titrated.

## 2015-10-08 NOTE — Assessment & Plan Note (Signed)
Discussed with him today the need to quit smoking.

## 2015-10-08 NOTE — Assessment & Plan Note (Signed)
Blood pressure is doing well.  Follow pressures.  Follow metabolic panel.   

## 2015-10-09 ENCOUNTER — Encounter: Payer: Self-pay | Admitting: Internal Medicine

## 2015-10-09 ENCOUNTER — Ambulatory Visit (INDEPENDENT_AMBULATORY_CARE_PROVIDER_SITE_OTHER): Payer: PPO | Admitting: Pulmonary Disease

## 2015-10-09 ENCOUNTER — Ambulatory Visit
Admission: RE | Admit: 2015-10-09 | Discharge: 2015-10-09 | Disposition: A | Discharge status: Home / Self Care | Payer: PPO | Source: Ambulatory Visit | Attending: Pulmonary Disease | Admitting: Pulmonary Disease

## 2015-10-09 ENCOUNTER — Encounter: Payer: Self-pay | Admitting: Pulmonary Disease

## 2015-10-09 VITALS — BP 110/72 | HR 74 | Ht 71.0 in | Wt 193.0 lb

## 2015-10-09 DIAGNOSIS — F17200 Nicotine dependence, unspecified, uncomplicated: Secondary | ICD-10-CM

## 2015-10-09 DIAGNOSIS — I272 Other secondary pulmonary hypertension: Secondary | ICD-10-CM

## 2015-10-09 DIAGNOSIS — J439 Emphysema, unspecified: Secondary | ICD-10-CM | POA: Diagnosis not present

## 2015-10-09 DIAGNOSIS — R0602 Shortness of breath: Secondary | ICD-10-CM | POA: Diagnosis not present

## 2015-10-09 DIAGNOSIS — Z720 Tobacco use: Secondary | ICD-10-CM

## 2015-10-09 DIAGNOSIS — I4892 Unspecified atrial flutter: Secondary | ICD-10-CM

## 2015-10-09 DIAGNOSIS — R0600 Dyspnea, unspecified: Secondary | ICD-10-CM

## 2015-10-09 DIAGNOSIS — R42 Dizziness and giddiness: Secondary | ICD-10-CM | POA: Diagnosis not present

## 2015-10-09 DIAGNOSIS — J449 Chronic obstructive pulmonary disease, unspecified: Secondary | ICD-10-CM | POA: Insufficient documentation

## 2015-10-09 DIAGNOSIS — I340 Nonrheumatic mitral (valve) insufficiency: Secondary | ICD-10-CM

## 2015-10-09 MED ORDER — LEVALBUTEROL TARTRATE 45 MCG/ACT IN AERO: 2.0000 | INHALATION_SPRAY | RESPIRATORY_TRACT | Status: DC | PRN

## 2015-10-09 MED ORDER — ALBUTEROL SULFATE HFA 108 (90 BASE) MCG/ACT IN AERS: 2.0000 | INHALATION_SPRAY | Freq: Four times a day (QID) | RESPIRATORY_TRACT | Status: DC | PRN

## 2015-10-09 MED ORDER — FLUTICASONE FUROATE-VILANTEROL 100-25 MCG/INH IN AEPB: 1.0000 | INHALATION_SPRAY | Freq: Every day | RESPIRATORY_TRACT | Status: AC

## 2015-10-09 MED ORDER — FLUTICASONE FUROATE-VILANTEROL 100-25 MCG/INH IN AEPB: 1.0000 | INHALATION_SPRAY | Freq: Every day | RESPIRATORY_TRACT | Status: DC

## 2015-10-09 NOTE — Progress Notes (Signed)
PULMONARY OFFICE FOLLOW UP NOTE  Requesting MD/Service: Einar Pheasant, MD Date of initial consultation: 09/18/15 Reason for consultation: Dyspnea  PT PROFILE: 75 y.o. M smoker underwent initial evaluation for progressive dyspnea on 09/18/15 with finding of cardiac arrhythmia - probable atrial flutter with variable conduction and frequent PVCs. Initial impression: Likely COPD. Acute worsening of respiratory symptoms possibly due to cardiac arrhythmia. Case discussed with Dr Saralyn Pilar. Trial of Incruse initiated  NM stress 08/17/15:  1. Equivocal ETT. 2. Normal left ventricular function. 3. Normal wall motion. 4. No evidence for scar or ischemia  TTE 08/02/15:  NORMAL LEFT VENTRICULAR SYSTOLIC FUNCTION (LVEF [id number]) WITH MODERATE LVH. MODERATE MITRAL REGURGITATION. MODERATE PHTN (PASP est 57 mmHg).  LA moderately dilated  09/22/15 PFTs: mild-mod obstruction. Normal TLC. Severe reduction DLCO  10/09/15 CXR: hyperinflation without acute findings  10/09/15 Routine follow up office visit to review results of CXR and PFTs. No discernible benefit from Incruse. Continued smoking - 5-6 cigs per day. Has undergone Holter monitor but does not know results. Has not yet been seen by Cardiology. Continued DOE. Also episodic severe light-headedness/pre-syncope not necessarily associated with dyspnea. PFTs revealed mild to moderate obstruction. CXR revealed emphysema with NAD. Encouraged F/U with cardiology. Encouraged smoking cessation. Trial of Breo and PRN albuterol - samples provided  10/09/15 ambulatory oximetry: no desaturation. Tachycardic and irregular immediately after ambulation  SUBJ: Routine follow up to review results of CXR and PFTs. No discernible benefit from Incruse. Continued smoking - 5-6 cigs per day. Has undergone Holter monitor but does not know results. Has not yet been seen by Cardiology. Continued DOE. Also episodic severe light-headedness/pre-syncope not necessarily associated with  dyspnea  Filed Vitals:   10/09/15 1122  BP: 110/72  Pulse: 74  Height: 5\' 11"  (1.803 m)  Weight: 87.544 kg (193 lb)  SpO2: 95%     EXAM:  Gen: WDWN, No overt respiratory distress HEENT: NCAT, TMs and canals normal, sclera white, nares and nasal mucosa normal, oropharynx normal Neck: Supple without LAN, thyromegaly, JVD Lungs: breath sounds mildly diminished, percussion note normal throughout, No adventitious sounds Cardiovascular: regular, no murmurs noted Abdomen: Soft, nontender, normal BS Ext: without clubbing, cyanosis, edema Neuro: CNs grossly intact, motor and sensory intact, DTRs symmetric Skin: Limited exam, no lesions noted  DATA:   BMP Latest Ref Rng 07/07/2015 06/12/2015 11/09/2014  Glucose 65 - 99 mg/dL 114(H) 102(H) 85  BUN 6 - 20 mg/dL 9 12 17   Creatinine 0.61 - 1.24 mg/dL 0.97 1.09 1.11  Sodium 135 - 145 mmol/L 130(L) 129(L) 129(L)  Potassium 3.5 - 5.1 mmol/L 4.5 4.8 4.8  Chloride 101 - 111 mmol/L 94(L) 94(L) 97  CO2 22 - 32 mmol/L 26 28 27   Calcium 8.9 - 10.3 mg/dL 9.4 10.2 9.9    CBC Latest Ref Rng 07/07/2015 06/27/2015 06/12/2015  WBC 3.8 - 10.6 K/uL 7.7 9.1 8.0  Hemoglobin 13.0 - 18.0 g/dL 13.4 13.5 13.6  Hematocrit 40.0 - 52.0 % 38.9(L) 40.7 40.7  Platelets 150 - 440 K/uL 406 436.0(H) 419.0(H)   09/22/15 PFTs: mild-mod obstruction. Normal TLC. Severe reduction DLCO 10/09/15 CXR: hyperinflation without acute findings    IMPRESSION:   1) Smoker - previously heavy. Presently 4-5 cigs per day. 2) COPD, predominantly emphysema - mild-mod obstruction on PFTs. Little evidence of reversibility  3) Exertional dyspnea - overall appears to be commensurate with physiological abnomality on PFTs 4) Pulmonary hypertension noted incidentally on TTE - likely group 2/3 (see below) 5) Episodic lightheadedness. I am concerned that this  is related to paroxysmal tachyarrhythmias which appear to be exercise induced   PLAN:  1) Again counseled re: smoking  cessation 2) Trial of Breo and PRN albuterol 3) Encouraged that he arrange follow up with Dr Saralyn Pilar 4) ROV 4 weeks to assess response to Breo and smoking cessation efforts   Wilhelmina [name], MD [facility]

## 2015-10-13 DIAGNOSIS — G4730 Sleep apnea, unspecified: Secondary | ICD-10-CM | POA: Diagnosis not present

## 2015-10-13 DIAGNOSIS — J410 Simple chronic bronchitis: Secondary | ICD-10-CM | POA: Diagnosis not present

## 2015-10-13 DIAGNOSIS — E7800 Pure hypercholesterolemia, unspecified: Secondary | ICD-10-CM | POA: Diagnosis not present

## 2015-10-13 DIAGNOSIS — Z9889 Other specified postprocedural states: Secondary | ICD-10-CM | POA: Diagnosis not present

## 2015-10-13 DIAGNOSIS — Z720 Tobacco use: Secondary | ICD-10-CM | POA: Diagnosis not present

## 2015-10-13 DIAGNOSIS — I10 Essential (primary) hypertension: Secondary | ICD-10-CM | POA: Diagnosis not present

## 2015-10-18 ENCOUNTER — Other Ambulatory Visit: Payer: Self-pay | Admitting: Internal Medicine

## 2015-10-23 DIAGNOSIS — G4733 Obstructive sleep apnea (adult) (pediatric): Secondary | ICD-10-CM | POA: Diagnosis not present

## 2015-10-30 ENCOUNTER — Other Ambulatory Visit: Payer: Self-pay | Admitting: Internal Medicine

## 2015-11-07 DIAGNOSIS — I10 Essential (primary) hypertension: Secondary | ICD-10-CM | POA: Diagnosis not present

## 2015-11-07 DIAGNOSIS — I480 Paroxysmal atrial fibrillation: Secondary | ICD-10-CM | POA: Diagnosis not present

## 2015-11-07 DIAGNOSIS — Z9889 Other specified postprocedural states: Secondary | ICD-10-CM | POA: Diagnosis not present

## 2015-11-07 DIAGNOSIS — Z720 Tobacco use: Secondary | ICD-10-CM | POA: Diagnosis not present

## 2015-11-07 DIAGNOSIS — J410 Simple chronic bronchitis: Secondary | ICD-10-CM | POA: Diagnosis not present

## 2015-11-07 DIAGNOSIS — E7800 Pure hypercholesterolemia, unspecified: Secondary | ICD-10-CM | POA: Diagnosis not present

## 2015-11-07 DIAGNOSIS — G4733 Obstructive sleep apnea (adult) (pediatric): Secondary | ICD-10-CM | POA: Diagnosis not present

## 2015-11-07 DIAGNOSIS — I4892 Unspecified atrial flutter: Secondary | ICD-10-CM | POA: Diagnosis not present

## 2015-11-07 DIAGNOSIS — R0789 Other chest pain: Secondary | ICD-10-CM | POA: Diagnosis not present

## 2015-11-10 ENCOUNTER — Ambulatory Visit (INDEPENDENT_AMBULATORY_CARE_PROVIDER_SITE_OTHER): Payer: PPO | Admitting: Internal Medicine

## 2015-11-10 ENCOUNTER — Encounter: Payer: Self-pay | Admitting: Internal Medicine

## 2015-11-10 VITALS — BP 138/78 | HR 86 | Temp 98.4°F | Resp 18 | Ht 71.0 in | Wt 190.0 lb

## 2015-11-10 DIAGNOSIS — K219 Gastro-esophageal reflux disease without esophagitis: Secondary | ICD-10-CM | POA: Diagnosis not present

## 2015-11-10 DIAGNOSIS — D472 Monoclonal gammopathy: Secondary | ICD-10-CM

## 2015-11-10 DIAGNOSIS — G4730 Sleep apnea, unspecified: Secondary | ICD-10-CM

## 2015-11-10 DIAGNOSIS — G8929 Other chronic pain: Secondary | ICD-10-CM

## 2015-11-10 DIAGNOSIS — I10 Essential (primary) hypertension: Secondary | ICD-10-CM

## 2015-11-10 DIAGNOSIS — K52831 Collagenous colitis: Secondary | ICD-10-CM

## 2015-11-10 DIAGNOSIS — G479 Sleep disorder, unspecified: Secondary | ICD-10-CM

## 2015-11-10 DIAGNOSIS — Z87891 Personal history of nicotine dependence: Secondary | ICD-10-CM

## 2015-11-10 DIAGNOSIS — I2510 Atherosclerotic heart disease of native coronary artery without angina pectoris: Secondary | ICD-10-CM

## 2015-11-10 DIAGNOSIS — R0602 Shortness of breath: Secondary | ICD-10-CM

## 2015-11-10 DIAGNOSIS — E871 Hypo-osmolality and hyponatremia: Secondary | ICD-10-CM

## 2015-11-10 DIAGNOSIS — M549 Dorsalgia, unspecified: Secondary | ICD-10-CM

## 2015-11-10 MED ORDER — DULOXETINE HCL 60 MG PO CPEP: 60.0000 mg | ORAL_CAPSULE | Freq: Every day | ORAL | Status: DC

## 2015-11-10 NOTE — Progress Notes (Signed)
Patient ID: Troy Lopez, male   DOB: [birth year], 75 y.o.   MRN: [id number]   Subjective:    Patient ID: Troy Lopez, male    DOB: Sep 10, 1940, 75 y.o.   MRN: [id number]  HPI  Patient here for a scheduled follow up.  He feels some better since clonidine has been decreased.  Blood pressure under reasonable control on current regimen.  He is sleeping better.  On cymbalta 30mg .  States unsure if helping.  Has decreased amount of ultram he is taking.  Saw cardiology.  Planning for ablation.  Seeing pulmonary.  Breathing stable.  Still with the sob with exertion as outlined in pulmonary notes.  Bowels stable.  No abdominal pain or cramping.  Did go for sleep study.  Waiting for valve.  Did sleep better with CPAP.     Past Medical History  Diagnosis Date  . Hypertension   . Hypercholesterolemia   . Irritable bowel syndrome   . GERD (gastroesophageal reflux disease)   . Colonic polyp   . Monoclonal gammopathy   . Hyperkalemia   . COPD (chronic obstructive pulmonary disease) ([place])   . Adrenal gland anomaly     enlargement  . Diverticulosis   . Depression   . Degenerative disc disease, lumbar   . Carpal tunnel syndrome   . Personal history of tobacco use, presenting hazards to health 08/17/2015  . CAD (coronary artery disease)    Past Surgical History  Procedure Laterality Date  . Bunionectomy  1989  . Hemorrhoid surgery    . Cholecystectomy  09/07  . Eye surgery Bilateral 2010    cataract  . Carpal tunnel release Left 2011    ulnar nerve sub muscular at elbow   Family History  Problem Relation Age of Onset  . Stroke Mother   . Heart disease Father     MI - 62   . Colon cancer Neg Hx   . Prostate cancer Neg Hx    Social History   Social History  . Marital Status: Married    Spouse Name: N/A  . Number of Children: 3  . Years of Education: N/A   Social History Main Topics  . Smoking status: Current Every Day Smoker    Types: Cigarettes  . Smokeless tobacco: Never  Used     Comment: about 2 cigarettes per day, trying to quit  . Alcohol Use: 0.0 oz/week    0 Standard drinks or equivalent per week     Comment: occas  . Drug Use: No  . Sexual Activity: Not Asked   Other Topics Concern  . None   Social History Narrative    Outpatient Encounter Prescriptions as of 11/10/2015  Medication Sig  . acetaminophen (TYLENOL) 325 MG tablet Take 650 mg by mouth every 6 (six) hours as needed for pain.  Marland Kitchen albuterol (PROVENTIL HFA;VENTOLIN HFA) 108 (90 Base) MCG/ACT inhaler Inhale 2 puffs into the lungs every 6 (six) hours as needed for wheezing or shortness of breath.  Marland Kitchen amLODipine (NORVASC) 5 MG tablet Take 1 tablet (5 mg total) by mouth daily.  . budesonide (ENTOCORT EC) 3 MG 24 hr capsule Take by mouth.  . cloNIDine (CATAPRES) 0.1 MG tablet Take by mouth.  . EPINEPHrine (EPI-PEN) 0.3 mg/0.3 mL SOAJ injection Inject 0.3 mLs (0.3 mg total) into the muscle once.  . fluticasone (FLONASE) 50 MCG/ACT nasal spray Place 2 sprays into both nostrils daily.  . fluticasone furoate-vilanterol (BREO ELLIPTA) 100-25 MCG/INH AEPB Inhale 1 puff  into the lungs daily.  . furosemide (LASIX) 20 MG tablet Take 1 tablet (20 mg total) by mouth daily as needed.  . gabapentin (NEURONTIN) 100 MG capsule Take two each morning, two each afternoon and three at bedtime  . isosorbide mononitrate (IMDUR) 30 MG 24 hr tablet Take by mouth.  . loperamide (IMODIUM) 2 MG capsule Take 2 mg by mouth daily as needed.  Marland Kitchen losartan (COZAAR) 100 MG tablet Take 1 tablet (100 mg total) by mouth daily.  . metoprolol (LOPRESSOR) 100 MG tablet Take 1 tablet (100 mg total) by mouth 2 (two) times daily.  . ondansetron (ZOFRAN) 8 MG tablet Take 8 mg by mouth every 8 (eight) hours as needed.  . pantoprazole (PROTONIX) 40 MG tablet Take 1 tablet (40 mg total) by mouth 2 (two) times daily.  . traMADol (ULTRAM) 50 MG tablet TAKE (1) TABLET EVERY SIX HOURS AS NEEDED.  Marland Kitchen zolpidem (AMBIEN) 5 MG tablet Take one tablet  before sleep study.  May repeat x 1 at study if needed.  . [DISCONTINUED] DULoxetine (CYMBALTA) 30 MG capsule Take 1 capsule (30 mg total) by mouth daily.  . DULoxetine (CYMBALTA) 60 MG capsule Take 1 capsule (60 mg total) by mouth daily.  Marland Kitchen levalbuterol (XOPENEX HFA) 45 MCG/ACT inhaler Inhale 2 puffs into the lungs every 4 (four) hours as needed for wheezing.   No facility-administered encounter medications on file as of 11/10/2015.    Review of Systems  Constitutional: Negative for appetite change and unexpected weight change.  HENT: Negative for congestion and sinus pressure.   Respiratory: Positive for shortness of breath (sob with exertion as outlined. ). Negative for cough and chest tightness.   Cardiovascular: Negative for chest pain, palpitations and leg swelling.  Gastrointestinal: Negative for nausea, vomiting, abdominal pain and diarrhea.  Genitourinary: Negative for dysuria and difficulty urinating.  Musculoskeletal:       Persistent pain issues.  Has decreased ultram intake.    Skin: Negative for color change and rash.  Neurological: Negative for dizziness, light-headedness and headaches.  Psychiatric/Behavioral: Negative for dysphoric mood and agitation.       Objective:     Blood pressure rechecked by me:  138/78  Physical Exam  Constitutional: He appears well-developed and well-nourished. No distress.  HENT:  Nose: Nose normal.  Mouth/Throat: Oropharynx is clear and moist.  Neck: Neck supple. No thyromegaly present.  Cardiovascular: Normal rate and regular rhythm.   Pulmonary/Chest: Effort normal and breath sounds normal. No respiratory distress.  Abdominal: Soft. Bowel sounds are normal. There is no tenderness.  Musculoskeletal: He exhibits no edema.  Lymphadenopathy:    He has no cervical adenopathy.  Skin: No rash noted. No erythema.  Psychiatric: He has a normal mood and affect. His behavior is normal.    BP 138/78 mmHg  Pulse 86  Temp(Src) 98.4 F  (36.9 C) (Oral)  Resp 18  Ht 5\' 11"  (1.803 m)  Wt 190 lb (86.183 kg)  BMI 26.51 kg/m2  SpO2 97% Wt Readings from Last 3 Encounters:  11/10/15 190 lb (86.183 kg)  10/09/15 193 lb (87.544 kg)  10/05/15 191 lb 3.2 oz (86.728 kg)     Lab Results  Component Value Date   WBC 7.7 07/07/2015   HGB 13.4 07/07/2015   HCT 38.9* 07/07/2015   PLT 406 07/07/2015   GLUCOSE 114* 07/07/2015   CHOL 171 06/12/2015   TRIG 72.0 06/12/2015   HDL 77.10 06/12/2015   LDLCALC 80 06/12/2015   ALT 11 06/12/2015  AST 14 06/12/2015   NA 130* 07/07/2015   K 4.5 07/07/2015   CL 94* 07/07/2015   CREATININE 0.97 07/07/2015   BUN 9 07/07/2015   CO2 26 07/07/2015   TSH 1.75 06/12/2015   PSA 1.24 11/09/2014    Dg Chest 2 View  10/09/2015  CLINICAL DATA:  Shortness of breath for 2 months, dizziness, COPD. Hypertension. EXAM: CHEST  2 VIEW COMPARISON:  11/22/2013 FINDINGS: Stable mild hyperinflation of lungs. Heart and mediastinal contours are within normal limits. No focal opacities or effusions. No acute bony abnormality. IMPRESSION: Hyperinflation/COPD.  No active cardiopulmonary disease. Electronically Signed   By: Rolm Baptise M.D.   On: 10/09/2015 11:14       Assessment & Plan:   Problem List Items Addressed This Visit    CAD (coronary artery disease)    Recently evaluated by cardiology.  Negative stress test.  Aflutter.  Planning for ablation.  Follow.        Chronic back pain    On cymbalta.  Has decreased use of tramadol.  Will increase cymbalta to 60mg  q day.  Try to stop tramadol.        Collagenous colitis    Colonoscopy 08/25/14.  Bowels better.  Follow.        GERD (gastroesophageal reflux disease)    EGD as outlined in overview.  Continue protonix.  Symptoms controlled.  Follow.        Hypertension - Primary    Blood pressure doing better.  Continue current regimen.  Follow pressures.  Follow metabolic panel.        Hyponatremia    Stable.  See previous notes.         Monoclonal gammopathy    Saw Dr Grayland Ormond.  Stable.  Recommended f/u SIEP in 6-12 months.        Personal history of tobacco use, presenting hazards to health    Discussed the need to quit.  Has cut down.  Follow.        Sleep apnea    Sleep study with sleep apnea.  Waiting on valve.  CPAP.        Sleep difficulties    Sleeping better.  Treat sleep apnea.  Increase cymbalta.        SOB (shortness of breath)    Recently evaluated by cardiology.  See notes.  Aflutter.  Planning for ablation.  Seeing pulmonary.  Continue breo.  Treat sleep apnea.           Einar Pheasant, MD

## 2015-11-10 NOTE — Progress Notes (Signed)
Pre-visit discussion using our clinic review tool. No additional management support is needed unless otherwise documented below in the visit note.  

## 2015-11-12 ENCOUNTER — Encounter: Payer: Self-pay | Admitting: Internal Medicine

## 2015-11-12 NOTE — Assessment & Plan Note (Signed)
Recently evaluated by cardiology.  See notes.  Aflutter.  Planning for ablation.  Seeing pulmonary.  Continue breo.  Treat sleep apnea.

## 2015-11-12 NOTE — Assessment & Plan Note (Signed)
On cymbalta.  Has decreased use of tramadol.  Will increase cymbalta to 60mg  q day.  Try to stop tramadol.

## 2015-11-12 NOTE — Assessment & Plan Note (Signed)
Blood pressure doing better.  Continue current regimen.  Follow pressures.  Follow metabolic panel.

## 2015-11-12 NOTE — Assessment & Plan Note (Signed)
Discussed the need to quit.  Has cut down.  Follow.

## 2015-11-12 NOTE — Assessment & Plan Note (Signed)
Recently evaluated by cardiology.  Negative stress test.  Aflutter.  Planning for ablation.  Follow.

## 2015-11-12 NOTE — Assessment & Plan Note (Signed)
Sleeping better.  Treat sleep apnea.  Increase cymbalta.

## 2015-11-12 NOTE — Assessment & Plan Note (Signed)
Sleep study with sleep apnea.  Waiting on valve.  CPAP.

## 2015-11-12 NOTE — Assessment & Plan Note (Signed)
Colonoscopy 08/25/14.  Bowels better.  Follow.

## 2015-11-12 NOTE — Assessment & Plan Note (Signed)
Stable.  See previous notes.

## 2015-11-12 NOTE — Assessment & Plan Note (Signed)
Saw Dr Grayland Ormond.  Stable.  Recommended f/u SIEP in 6-12 months.

## 2015-11-12 NOTE — Assessment & Plan Note (Signed)
EGD as outlined in overview.  Continue protonix.  Symptoms controlled.  Follow.

## 2015-11-14 ENCOUNTER — Other Ambulatory Visit: Payer: Self-pay | Admitting: Cardiology

## 2015-11-14 ENCOUNTER — Ambulatory Visit (INDEPENDENT_AMBULATORY_CARE_PROVIDER_SITE_OTHER): Payer: PPO | Admitting: Pulmonary Disease

## 2015-11-14 ENCOUNTER — Encounter: Payer: Self-pay | Admitting: Pulmonary Disease

## 2015-11-14 VITALS — BP 136/86 | HR 121 | Ht 71.0 in | Wt 190.0 lb

## 2015-11-14 DIAGNOSIS — J449 Chronic obstructive pulmonary disease, unspecified: Secondary | ICD-10-CM

## 2015-11-14 DIAGNOSIS — R0600 Dyspnea, unspecified: Secondary | ICD-10-CM

## 2015-11-14 DIAGNOSIS — Z720 Tobacco use: Secondary | ICD-10-CM

## 2015-11-14 DIAGNOSIS — F17200 Nicotine dependence, unspecified, uncomplicated: Secondary | ICD-10-CM

## 2015-11-14 NOTE — H&P (Signed)
Printout Information Document Contents Office Visit Document Received Date 11/10/2015 Document Source Organization [facility] Patient Demographics  Reason for Referral  Reason for Visit  Encounter Details  Social Hx  Instructions  Progress Notes  Plan of Treatment  Visit Diagnoses  Document Information Encounter Summary - Kenrick Kramlich Warnke [id number] y.o. Derrek Gu of Mar. 24, 2016 Patient Demographics Patient Address Communication Language Race / Ethnicity  [place], [place] [address]  [phone] [facility]) [phone] (Mobile) [email or web address]  English (Preferred) White / Not Hispanic or Latino  Reason for Referral Outgoing Referral Reason Specialty Diagnoses / Procedures Referred By Contact Referred To Contact  Procedure (Routine)  Pending Review    Diagnoses  Atrial flutter, unspecified type  Procedures  ECG 12-lead  Isaias Cowman, MD  [address]  [facility]  [place], [place] [address]  Phone: [phone]  Fax: [phone]     Reason for Visit Reason Comments  Follow-up 3 weeks-everything same  Encounter Details Date Type Department Care Team Description  11/07/2015 Office Visit [facility]  [place], [place] [address]  [phone]  Isaias Cowman, [place]  [facility]  [place],  [address]  [phone]  [phone] (Fax)  Atrial flutter, unspecified type (Primary Dx);Essential hypertension;Simple chronic bronchitis (CMS-HCC);Chest pain of uncertain etiology;H/O cardiac catheterization;Pure hypercholesterolemia;Obstructive sleep apnea syndrome;Tobacco abuse  Social History - as of this encounter Tobacco Use Types Packs/Day Years Used Date  Current Every Day Smoker Cigarettes 0.25 66   Smokeless Tobacco: Never Used      Comments: 2 cigs a day   Alcohol Use Drinks/Week oz/Week Comments  Yes 42 Cans of  beer  25.2  Beer daily.   Birth Sex Date Recorded  Unknown   Instructions - in this encounter Patient Instructions - Isaias Cowman, MD - 11/07/2015 11:00 AM EDT DASH Eating Plan DASH stands for "Dietary Approaches to Stop Hypertension." The DASH eating plan is a healthy eating plan that has been shown to reduce high blood pressure (hypertension). Additional health benefits may include reducing the risk of type 2 diabetes mellitus, heart disease, and stroke. The DASH eating plan may also help with weight loss. WHAT DO I NEED TO KNOW ABOUT THE DASH EATING PLAN? For the DASH eating plan, you will follow these general guidelines:  Choose foods with a percent daily value for sodium of less than 5% (as listed on the food label).  Use salt-free seasonings or herbs instead of table salt or sea salt.  Check with your health care provider or pharmacist before using salt substitutes.  Eat lower-sodium products, often labeled as "lower sodium" or "no salt added."  Eat fresh foods.  Eat more vegetables, fruits, and low-fat dairy products.  Choose whole grains. Look for the word "whole" as the first word in the ingredient list.  Choose fish and skinless chicken or [place] more often than red meat. Limit fish, poultry, and meat to 6 oz (170 g) each day.  Limit sweets, desserts, sugars, and sugary drinks.  Choose heart-healthy fats.  Limit cheese to 1 oz (28 g) per day.  Eat more home-cooked food and less restaurant, buffet, and fast food.  Limit fried foods.  Cook foods using methods other than frying.  Limit canned vegetables. If you do use them, rinse them well to decrease the sodium.  When eating at a restaurant, ask that your food be prepared with less salt, or no salt if possible. WHAT FOODS CAN I  EAT? Seek help from a dietitian for individual calorie needs. Grains Whole grain or whole wheat bread. Brown rice. Whole grain or whole wheat pasta. Quinoa, bulgur, and whole  grain cereals. Low-sodium cereals. Corn or whole wheat flour tortillas. Whole grain cornbread. Whole grain crackers. Low-sodium crackers. Vegetables Fresh or frozen vegetables (raw, steamed, roasted, or grilled). Low-sodium or reduced-sodium tomato and vegetable juices. Low-sodium or reduced-sodium tomato sauce and paste. Low-sodium or reduced-sodium canned vegetables.  Fruits All fresh, canned (in natural juice), or frozen fruits. Meat and Other Protein Products Ground beef (85% or leaner), grass-fed beef, or beef trimmed of fat. Skinless chicken or [place]. Ground chicken or [place]. Pork trimmed of fat. All fish and seafood. Eggs. Dried beans, peas, or lentils. Unsalted nuts and seeds. Unsalted canned beans. Dairy Low-fat dairy products, such as skim or 1% milk, 2% or reduced-fat cheeses, low-fat ricotta or cottage cheese, or plain low-fat yogurt. Low-sodium or reduced-sodium cheeses. Fats and Oils Tub margarines without trans fats. Light or reduced-fat mayonnaise and salad dressings (reduced sodium). Avocado. Safflower, olive, or canola oils. Natural peanut or almond butter. Other Unsalted popcorn and pretzels. The items listed above may not be a complete list of recommended foods or beverages. Contact your dietitian for more options. WHAT FOODS ARE NOT RECOMMENDED? Grains White bread. White pasta. White rice. Refined cornbread. Bagels and croissants. Crackers that contain trans fat. Vegetables Creamed or fried vegetables. Vegetables in a cheese sauce. Regular canned vegetables. Regular canned tomato sauce and paste. Regular tomato and vegetable juices. Fruits Dried fruits. Canned fruit in light or heavy syrup. Fruit juice. Meat and Other Protein Products Fatty cuts of meat. Ribs, chicken wings, bacon, sausage, bologna, salami, chitterlings, fatback, hot dogs, bratwurst, and packaged luncheon meats. Salted nuts and seeds. Canned beans with salt. Dairy Whole or 2% milk, cream, half-and-half,  and cream cheese. Whole-fat or sweetened yogurt. Full-fat cheeses or blue cheese. Nondairy creamers and whipped toppings. Processed cheese, cheese spreads, or cheese curds. Condiments Onion and garlic salt, seasoned salt, table salt, and sea salt. Canned and packaged gravies. Worcestershire sauce. Tartar sauce. Barbecue sauce. Teriyaki sauce. Soy sauce, including reduced sodium. Steak sauce. Fish sauce. Oyster sauce. Cocktail sauce. Horseradish. Ketchup and mustard. Meat flavorings and tenderizers. Bouillon cubes. Hot sauce. Tabasco sauce. Marinades. Taco seasonings. Relishes. Fats and Oils Butter, stick margarine, lard, shortening, ghee, and bacon fat. Coconut, palm kernel, or palm oils. Regular salad dressings. Other Pickles and olives. Salted popcorn and pretzels. The items listed above may not be a complete list of foods and beverages to avoid. Contact your dietitian for more information. WHERE CAN I FIND MORE INFORMATION? National Heart, Lung, and Blood Institute: travelstabloid.com  This information is not intended to replace advice given to you by your health care provider. Make sure you discuss any questions you have with your health care provider.  Document Released: 07/25/2011 Document Revised: 08/26/2014 Document Reviewed: 06/09/2013 Elsevier Interactive Patient Education 2016 Elsevier Inc. Fat and Cholesterol Restricted Diet High levels of fat and cholesterol in your blood may lead to various health problems, such as diseases of the heart, blood vessels, gallbladder, liver, and pancreas. Fats are concentrated sources of energy that come in various forms. Certain types of fat, including saturated fat, may be harmful in excess. Cholesterol is a substance needed by your body in small amounts. Your body makes all the cholesterol it needs. Excess cholesterol comes from the food you eat. When you have high levels of cholesterol and saturated fat in your blood,  health problems can develop because the excess fat and cholesterol will gather along the walls of your blood vessels, causing them to narrow. Choosing the right foods will help you control your intake of fat and cholesterol. This will help keep the levels of these substances in your blood within normal limits and reduce your risk of disease. WHAT IS MY PLAN? Your health care provider recommends that you:  Get no more than __________ % of the total calories in your daily diet from fat.  Limit your intake of saturated fat to less than ______% of your total calories each day.  Limit the amount of cholesterol in your diet to less than _________mg per day. WHAT TYPES OF FAT SHOULD I CHOOSE?  Choose healthy fats more often. Choose monounsaturated and polyunsaturated fats, such as olive and canola oil, flaxseeds, walnuts, almonds, and seeds.  Eat more omega-3 fats. Good choices include salmon, mackerel, sardines, tuna, flaxseed oil, and ground flaxseeds. Aim to eat fish at least two times a week.  Limit saturated fats. Saturated fats are primarily found in animal products, such as meats, butter, and cream. Plant sources of saturated fats include palm oil, palm kernel oil, and coconut oil.  Avoid foods with partially hydrogenated oils in them. These contain trans fats. Examples of foods that contain trans fats are stick margarine, some tub margarines, cookies, crackers, and other baked goods. WHAT GENERAL GUIDELINES DO I NEED TO FOLLOW? These guidelines for healthy eating will help you control your intake of fat and cholesterol:  Check food labels carefully to identify foods with trans fats or high amounts of saturated fat.  Fill one half of your plate with vegetables and green salads.  Fill one fourth of your plate with whole grains. Look for the word "whole" as the first word in the ingredient list.  Fill one fourth of your plate with lean protein foods.  Limit fruit to two servings a day.  Choose fruit instead of juice.  Eat more foods that contain soluble fiber. Examples of foods that contain this type of fiber are apples, broccoli, carrots, beans, peas, and barley. Aim to get 20-30 g of fiber per day.  Eat more home-cooked food and less restaurant, buffet, and fast food.  Limit or avoid alcohol.  Limit foods high in starch and sugar.  Limit fried foods.  Cook foods using methods other than frying. Baking, boiling, grilling, and broiling are all great options.  Lose weight if you are overweight. Losing just 5-10% of your initial body weight can help your overall health and prevent diseases such as diabetes and heart disease. WHAT FOODS CAN I EAT? Grains Whole grains, such as whole wheat or whole grain breads, crackers, cereals, and pasta. Unsweetened oatmeal, bulgur, barley, quinoa, or brown rice. Corn or whole wheat flour tortillas. Vegetables Fresh or frozen vegetables (raw, steamed, roasted, or grilled). Green salads. Fruits All fresh, canned (in natural juice), or frozen fruits. Meat and Other Protein Products Ground beef (85% or leaner), grass-fed beef, or beef trimmed of fat. Skinless chicken or [place]. Ground chicken or [place]. Pork trimmed of fat. All fish and seafood. Eggs. Dried beans, peas, or lentils. Unsalted nuts or seeds. Unsalted canned or dry beans. Dairy Low-fat dairy products, such as skim or 1% milk, 2% or reduced-fat cheeses, low-fat ricotta or cottage cheese, or plain low-fat yogurt. Fats and Oils Tub margarines without trans fats. Light or reduced-fat mayonnaise and salad dressings. Avocado. Olive, canola, sesame, or safflower oils. Natural peanut or almond  butter (choose ones without added sugar and oil). The items listed above may not be a complete list of recommended foods or beverages. Contact your dietitian for more options. WHAT FOODS ARE NOT RECOMMENDED? Grains White bread. White pasta. White rice. Cornbread. Bagels, pastries, and  croissants. Crackers that contain trans fat. Vegetables White potatoes. Corn. Creamed or fried vegetables. Vegetables in a cheese sauce. Fruits Dried fruits. Canned fruit in light or heavy syrup. Fruit juice. Meat and Other Protein Products Fatty cuts of meat. Ribs, chicken wings, bacon, sausage, bologna, salami, chitterlings, fatback, hot dogs, bratwurst, and packaged luncheon meats. Liver and organ meats. Dairy Whole or 2% milk, cream, half-and-half, and cream cheese. Whole milk cheeses. Whole-fat or sweetened yogurt. Full-fat cheeses. Nondairy creamers and whipped toppings. Processed cheese, cheese spreads, or cheese curds. Sweets and Desserts Corn syrup, sugars, honey, and molasses. Candy. Jam and jelly. Syrup. Sweetened cereals. Cookies, pies, cakes, donuts, muffins, and ice cream. Fats and Oils Butter, stick margarine, lard, shortening, ghee, or bacon fat. Coconut, palm kernel, or palm oils. Beverages Alcohol. Sweetened drinks (such as sodas, lemonade, and fruit drinks or punches). The items listed above may not be a complete list of foods and beverages to avoid. Contact your dietitian for more information.  This information is not intended to replace advice given to you by your health care provider. Make sure you discuss any questions you have with your health care provider.  Document Released: 08/05/2005 Document Revised: 08/26/2014 Document Reviewed: 11/03/2013 Elsevier Interactive Patient Education 2016 [employer].   Progress Notes - in this encounter Isaias Cowman, MD - 11/07/2015 11:00 AM EDT Formatting of this note may be different from the original. Established Patient Visit   Chief Complaint: Chief Complaint  Patient presents with  . Follow-up  3 weeks-everything same  Date of Service: 11/07/2015 Date of Birth: 08-08-1941 PCP: Alisa Graff, MD, MD  History of Present Illness: Mr. Radakovich is a 75 y.o.male patient who returns for  1. 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 Essential hypertension 3. Hyperlipidemia 4. COPD 5. Sleep apnea 6. Atrial flutter  The patient denies chest pain. He does report chronic exertional dyspnea due to underlying COPD. He denies palpitations or heart racing. He denies peripheral edema. Echocardiogram 08/02/2015 revealed LVEF of 50% with moderate mitral and tricuspid regurgitation. ETT sestamibi study 08/17/15 revealed LVEF of 60% without evidence for scar or ischemia.  The patient was recently seen by his pulmonologist for treatment and management of COPD. He was noted to be tachycardic, 48 hour Holter monitor was performed, which revealed predominant atrial flutter with a mean heart rate of 78 bpm. As mentioned above, the patient denies palpitations or heart racing, but does report generalized fatigue. The patient does have a history of COPD and sleep apnea. Patient was started on Eliquis during his last office visit 10/13/2015 which has been well tolerated. EKG today reveals persistent atrial flutter with 4-1 conduction at 72 bpm.  The patient has essential hypertension, pressure well controlled, currently on amlodipine, clonidine, furosemide, losartan, and metoprolol tartrate, Toprol tolerated without apparent side effects. The patient is is following a low-sodium, no added salt diet.  The patient's hyperlipidemia, currently on diet therapy, following a low cholesterol, low fat diet, followed by his primary care provider.  Past Medical and Surgical History  Past Medical History Past Medical History  Diagnosis Date  . Anxiety and depression, unspecified  . Colitis, collagenous 08/25/2014  . Colon polyp  . COPD (chronic obstructive pulmonary disease) , unspecified (  CMS-HCC)  . COPD (chronic obstructive pulmonary disease) , unspecified (CMS-HCC)  . Diverticulosis  . GERD (gastroesophageal reflux disease)  . H/O degenerative disc disease  . HTN (hypertension) 12/22/2013  . Hyperkalemia  . Hyperlipemia, unspecified  12/22/2013  . Irritable bowel syndrome  . Monoclonal gammopathy  . Sleep apnea 02/18/2014  . Tobacco use   Past Surgical History He has a past surgical history that includes Cardiac catheterization; Colonoscopy (05/12/2013); Colonoscopy (08/05/2011, 12/24/2007, 08/02/2005, 06/05/1999); egd (08/10/2013); egd (07/09/2013, 01/23/2013, 11/06/2011, 08/05/2011, 12/24/2007, 08/02/2005); RIGHT FOOT BUNIONECTOMY (03/1988); HEMORRHOID SURGERY; Cholecystectomy (04/2006); Sigmoidoscopy (05/15/2007); Colonoscopy (08/25/2014); and egd (08/25/2014).   Medications and Allergies  Current Medications  Current Outpatient Prescriptions  Medication Sig Dispense Refill  . acetaminophen (TYLENOL) 500 mg capsule Take 500 mg by mouth every 6 (six) hours as needed.  Marland Kitchen albuterol 90 mcg/actuation inhaler Inhale into the lungs.  Marland Kitchen amLODIPine (NORVASC) 10 MG tablet Take 5 mg by mouth once daily.   Marland Kitchen apixaban (ELIQUIS) 5 mg tablet Take 1 tablet (5 mg total) by mouth 2 (two) times daily. 60 tablet 0  . budesonide (ENTOCORT EC) 3 mg EC capsule Take 3 capsules (9 mg total) by mouth every morning. 90 capsule 3  . cloNIDine HCl (CATAPRES) 0.1 MG tablet Take 1 tablet (0.1 mg total) by mouth 2 (two) times daily. (Patient taking differently: Take 0.1 mg by mouth once.  ) 60 tablet 11  . DULoxetine (CYMBALTA) 30 MG DR capsule  . EPINEPHrine (EPIPEN) 0.3 mg/0.3 mL pen injector Inject 0.3 mg into the muscle as needed.   . fluticasone (FLONASE) 50 mcg/actuation nasal spray Place 2 sprays into both nostrils once daily.  . fluticasone-vilanterol (BREO ELLIPTA) 100-25 mcg/dose DsDv inhaler Inhale into the lungs.  . FUROsemide (LASIX) 20 MG tablet Take 20 mg by mouth once daily as needed.  . gabapentin (NEURONTIN) 100 MG capsule Take 100 mg by mouth 3 (three) times daily. Patient takes 2 capsules in AM, 2 capsules in afternoon and 3 capsules at bedtime.  . isosorbide mononitrate (IMDUR) 30 MG ER tablet Take 1 tablet (30 mg total) by mouth once  daily. 30 tablet 11  . levalbuterol (XOPENEX HFA) inhaler Inhale into the lungs.  . loperamide (IMODIUM) 2 mg capsule Take 2 mg by mouth 4 (four) times daily as needed for Diarrhea.  . losartan (COZAAR) 100 MG tablet Take 100 mg by mouth once daily.  . metoprolol tartrate (LOPRESSOR) 100 MG tablet Take 100 mg by mouth 2 (two) times daily.  . ondansetron (ZOFRAN) 8 MG tablet Take 1 tablet (8 mg total) by mouth every 8 (eight) hours as needed for Nausea. 90 tablet 0  . pantoprazole (PROTONIX) 40 MG DR tablet Take 40 mg by mouth 2 (two) times daily.  . traMADol (ULTRAM) 50 mg tablet Take 50 mg by mouth every 6 (six) hours as needed.  . zolpidem (AMBIEN) 5 MG tablet Take one tablet before sleep study. May repeat x 1 at study if needed.   No current facility-administered medications for this visit.   Allergies: Iodinated contrast media - oral and iv dye  Social and Family History  Social History reports that he has been smoking Cigarettes. He has a 16.50 pack-year smoking history. He has never used smokeless tobacco. He reports that he drinks about 25.2 oz of alcohol per week He reports that he does not use illicit drugs.  Family History Family History  Problem Relation Age of Onset  . Heart attack Father  . Stroke Mother  Review of Systems   Review of Systems: The patient denies chest pain, with chronic exertional shortness of breath, without orthopnea, paroxysmal nocturnal dyspnea, pedal edema, palpitations, heart racing, presyncope, syncope. Review of 12 Systems is negative except as described above.  Physical Examination   Vitals: Visit Vitals  . BP (P) 132/78  . Pulse (P) 70  . Ht (P) 180.3 cm (5\' 11" )  . Wt (P) 86.8 kg (191 lb 6.4 oz)  . BMI (P) 26.69 kg/m2   Ht:(P) 180.3 cm (5\' 11" ) Wt:(P) 86.8 kg (191 lb 6.4 oz) [id number] surface area is 2.09 meters squared (pended). Body mass index is 26.69 kg/(m^2) (pended).  HEENT: Pupils equally reactive to light and accomodation    Neck: Supple without thyromegaly, carotid pulses 2+ Lungs: clear to auscultation bilaterally; no wheezes, rales, rhonchi Heart: Regular rate and rhythm. No gallops, murmurs or rub Abdomen: soft nontender, nondistended, with normal bowel sounds Extremities: no cyanosis, clubbing, or edema Peripheral Pulses: 2+ in all extremities, 2+ femoral pulses bilaterally  Assessment   76 y.o. male with  1. Atrial flutter, unspecified type  2. Essential hypertension  3. Simple chronic bronchitis (CMS-HCC)  4. Chest pain of uncertain etiology  5. H/O cardiac catheterization  6. Pure hypercholesterolemia  7. Obstructive sleep apnea syndrome  8. Tobacco abuse   75 year old gentleman known coronary artery disease, without chest pain. ETT sestamibi study revealed normal left ventricular function without evidence for scar or ischemia. Patient noted to have tachycardia, Holter monitor revealed predominant atrial flutter with a controlled rate at 78 bpm. Patient appears to be asymptomatic out palpitations or heart racing. Patient has chads vas score of 2, currently on Eliquis for stroke prevention.  Plan   1. Continue current medications 2. Counseled patient about low-sodium diet 3. DASH diet printed instructions given to the patient 4. Counseled patient about low-cholesterol diet 5. Low-fat and cholesterol diet printed instructions given to the patient 6. Strongly encourage patient stopped smoking 7. Proceed with elective cardioversion. The risks, benefits and alternatives of electrical cardioversion were explained to the patient and informed written consent was obtained.  Orders Placed This Encounter  Procedures  . ECG 12-lead   Return in about 2 weeks (around 11/21/2015), or after cardioversion.  Isaias Cowman, MD    Plan of Treatment - as of this encounter Upcoming Encounters Upcoming Encounters  Date Type Specialty Care Team Description  11/22/2015 Office Visit Cardiology Isaias Cowman, MD  [place]  [facility]  [place], [place] [address]  [phone]  [phone]    11/30/2015 Office Visit Gastroenterology [place], Marlena Clipper, MD  [place]  [facility] [address]  [place], [place] [address]  [phone]  [phone] ([address]   Faye Ramsay [employer], NP  [place]  [place], [place] [address]  [phone]  [phone] (Fax)     Pending Results Pending Results  Name Priority Associated Diagnoses Date/Time  ECG 12-lead Routine Atrial flutter, unspecified type  11/07/2015 11:14 AM EDT  Visit Diagnoses - in this encounter Diagnosis  Atrial flutter, unspecified type - Primary  Essential hypertension  Simple chronic bronchitis (CMS-HCC)  Simple chronic bronchitis   Chest pain of uncertain etiology  H/O cardiac catheterization  Pure hypercholesterolemia  Obstructive sleep apnea syndrome  Obstructive sleep apnea (adult) (pediatric)   Tobacco abuse  Tobacco use disorder   Document Information Service Providers Document Coverage Dates Mar. 21, 2017 - Mar. 21, 2017 Laurence Slate [phone] (Work)  [place], [place] [address] Encounter Providers Isaias Cowman MD (Attending) [phone] (Work) [phone] (Fax)  [address] [facility] [place], [place] [address] Encounter Date Mar. 21, 2017 - Mar. 21, 2017

## 2015-11-14 NOTE — Interval H&P Note (Signed)
History and Physical Interval Note:  11/14/2015 9:03 AM  Troy Lopez  has presented today for surgery, with the diagnosis of Afib  The various methods of treatment have been discussed with the patient and family. After consideration of risks, benefits and other options for treatment, the patient has consented to  Procedure(s): CARDIOVERSION (N/A) as a surgical intervention .  The patient's history has been reviewed, patient examined, no change in status, stable for surgery.  I have reviewed the patient's chart and labs.  Questions were answered to the patient's satisfaction.     [facility] [place]

## 2015-11-14 NOTE — Patient Instructions (Signed)
Smoking cessation as we discussed. Suggested 2 mg nicotine lozenges rather than 4 mg Continue Breo inhaler as previously Follow up in 6-8 weeks to reassess breathing after cardioversion is done

## 2015-11-15 ENCOUNTER — Ambulatory Visit: Payer: PPO | Admitting: Anesthesiology

## 2015-11-15 ENCOUNTER — Other Ambulatory Visit: Payer: Self-pay

## 2015-11-15 ENCOUNTER — Encounter
Admission: RE | Disposition: A | Discharge status: Home / Self Care | Payer: Self-pay | Source: Ambulatory Visit | Attending: Cardiology

## 2015-11-15 ENCOUNTER — Ambulatory Visit
Admission: RE | Admit: 2015-11-15 | Discharge: 2015-11-15 | Disposition: A | Discharge status: Home / Self Care | Payer: PPO | Source: Ambulatory Visit | Attending: Cardiology | Admitting: Cardiology

## 2015-11-15 DIAGNOSIS — F329 Major depressive disorder, single episode, unspecified: Secondary | ICD-10-CM | POA: Insufficient documentation

## 2015-11-15 DIAGNOSIS — J410 Simple chronic bronchitis: Secondary | ICD-10-CM | POA: Diagnosis not present

## 2015-11-15 DIAGNOSIS — R51 Headache: Secondary | ICD-10-CM | POA: Diagnosis not present

## 2015-11-15 DIAGNOSIS — K589 Irritable bowel syndrome without diarrhea: Secondary | ICD-10-CM | POA: Insufficient documentation

## 2015-11-15 DIAGNOSIS — Z8249 Family history of ischemic heart disease and other diseases of the circulatory system: Secondary | ICD-10-CM | POA: Diagnosis not present

## 2015-11-15 DIAGNOSIS — Z9049 Acquired absence of other specified parts of digestive tract: Secondary | ICD-10-CM | POA: Insufficient documentation

## 2015-11-15 DIAGNOSIS — Z91041 Radiographic dye allergy status: Secondary | ICD-10-CM | POA: Diagnosis not present

## 2015-11-15 DIAGNOSIS — M1990 Unspecified osteoarthritis, unspecified site: Secondary | ICD-10-CM | POA: Diagnosis not present

## 2015-11-15 DIAGNOSIS — K219 Gastro-esophageal reflux disease without esophagitis: Secondary | ICD-10-CM | POA: Insufficient documentation

## 2015-11-15 DIAGNOSIS — E7800 Pure hypercholesterolemia, unspecified: Secondary | ICD-10-CM | POA: Insufficient documentation

## 2015-11-15 DIAGNOSIS — D472 Monoclonal gammopathy: Secondary | ICD-10-CM | POA: Insufficient documentation

## 2015-11-15 DIAGNOSIS — F17210 Nicotine dependence, cigarettes, uncomplicated: Secondary | ICD-10-CM | POA: Diagnosis not present

## 2015-11-15 DIAGNOSIS — I4892 Unspecified atrial flutter: Secondary | ICD-10-CM | POA: Insufficient documentation

## 2015-11-15 DIAGNOSIS — I10 Essential (primary) hypertension: Secondary | ICD-10-CM | POA: Insufficient documentation

## 2015-11-15 DIAGNOSIS — M5136 Other intervertebral disc degeneration, lumbar region: Secondary | ICD-10-CM | POA: Diagnosis not present

## 2015-11-15 DIAGNOSIS — R0602 Shortness of breath: Secondary | ICD-10-CM | POA: Diagnosis not present

## 2015-11-15 DIAGNOSIS — R079 Chest pain, unspecified: Secondary | ICD-10-CM | POA: Diagnosis not present

## 2015-11-15 DIAGNOSIS — F419 Anxiety disorder, unspecified: Secondary | ICD-10-CM | POA: Diagnosis not present

## 2015-11-15 DIAGNOSIS — I4891 Unspecified atrial fibrillation: Secondary | ICD-10-CM | POA: Diagnosis not present

## 2015-11-15 DIAGNOSIS — Z823 Family history of stroke: Secondary | ICD-10-CM | POA: Diagnosis not present

## 2015-11-15 DIAGNOSIS — I2510 Atherosclerotic heart disease of native coronary artery without angina pectoris: Secondary | ICD-10-CM | POA: Diagnosis not present

## 2015-11-15 DIAGNOSIS — Z7901 Long term (current) use of anticoagulants: Secondary | ICD-10-CM | POA: Insufficient documentation

## 2015-11-15 DIAGNOSIS — I4510 Unspecified right bundle-branch block: Secondary | ICD-10-CM | POA: Diagnosis not present

## 2015-11-15 DIAGNOSIS — Z8719 Personal history of other diseases of the digestive system: Secondary | ICD-10-CM | POA: Insufficient documentation

## 2015-11-15 DIAGNOSIS — K5790 Diverticulosis of intestine, part unspecified, without perforation or abscess without bleeding: Secondary | ICD-10-CM | POA: Insufficient documentation

## 2015-11-15 DIAGNOSIS — Z79899 Other long term (current) drug therapy: Secondary | ICD-10-CM | POA: Insufficient documentation

## 2015-11-15 DIAGNOSIS — E785 Hyperlipidemia, unspecified: Secondary | ICD-10-CM | POA: Insufficient documentation

## 2015-11-15 DIAGNOSIS — G4733 Obstructive sleep apnea (adult) (pediatric): Secondary | ICD-10-CM | POA: Diagnosis not present

## 2015-11-15 DIAGNOSIS — Z86010 Personal history of colonic polyps: Secondary | ICD-10-CM | POA: Insufficient documentation

## 2015-11-15 DIAGNOSIS — J449 Chronic obstructive pulmonary disease, unspecified: Secondary | ICD-10-CM | POA: Insufficient documentation

## 2015-11-15 HISTORY — PX: ELECTROPHYSIOLOGIC STUDY: SHX172A

## 2015-11-15 SURGERY — CARDIOVERSION (CATH LAB)
Anesthesia: General

## 2015-11-15 MED ORDER — OXYCODONE HCL 5 MG/5ML PO SOLN: 5.0000 mg | Freq: Once | ORAL | Status: DC | PRN

## 2015-11-15 MED ORDER — PROPOFOL 10 MG/ML IV BOLUS
INTRAVENOUS | Status: DC | PRN
  Administered 2015-11-15: 20 mg via INTRAVENOUS
  Administered 2015-11-15: 70 mg via INTRAVENOUS

## 2015-11-15 MED ORDER — OXYCODONE HCL 5 MG PO TABS: 5.0000 mg | ORAL_TABLET | Freq: Once | ORAL | Status: DC | PRN

## 2015-11-15 MED ORDER — FENTANYL CITRATE (PF) 100 MCG/2ML IJ SOLN: 25.0000 ug | INTRAMUSCULAR | Status: DC | PRN

## 2015-11-15 MED ORDER — SODIUM CHLORIDE 0.9 % IV SOLN
INTRAVENOUS | Status: DC
  Administered 2015-11-15: 07:00:00 via INTRAVENOUS

## 2015-11-15 NOTE — Anesthesia Postprocedure Evaluation (Signed)
Anesthesia Post Note  Patient: Troy Lopez  Procedure(s) Performed: Procedure(s) (LRB): CARDIOVERSION (N/A)  Patient location during evaluation: Cath Lab Anesthesia Type: General Level of consciousness: awake and alert Pain management: pain level controlled Vital Signs Assessment: post-procedure vital signs reviewed and stable Respiratory status: spontaneous breathing, nonlabored ventilation, respiratory function stable and patient connected to nasal cannula oxygen Cardiovascular status: blood pressure returned to baseline and stable Postop Assessment: no signs of nausea or vomiting Anesthetic complications: no    Last Vitals:  Filed Vitals:   11/15/15 0800 11/15/15 0815  BP: 124/90 131/94  Pulse: 71 79  Temp:    Resp: 14 25    Last Pain: There were no vitals filed for this visit.               Precious Haws Markasia Carrol

## 2015-11-15 NOTE — Anesthesia Preprocedure Evaluation (Addendum)
Anesthesia Evaluation  Patient identified by MRN, date of birth, ID band Patient awake    Reviewed: Allergy & Precautions, H&P , NPO status , Patient's Chart, lab work & pertinent test results  History of Anesthesia Complications Negative for: history of anesthetic complications  Airway Mallampati: III  TM Distance: >3 FB Neck ROM: limited    Dental  (+) Poor Dentition, Upper Dentures, Lower Dentures, Missing   Pulmonary shortness of breath and with exertion, sleep apnea and Continuous Positive Airway Pressure Ventilation , COPD, former smoker,    Pulmonary exam normal breath sounds clear to auscultation       Cardiovascular Exercise Tolerance: Good hypertension, (-) angina+ CAD and + DOE  (-) Past MI + dysrhythmias Atrial Fibrillation  Rhythm:irregular Rate:Normal     Neuro/Psych  Headaches, PSYCHIATRIC DISORDERS Depression  Neuromuscular disease    GI/Hepatic Neg liver ROS, GERD  Controlled,  Endo/Other  negative endocrine ROS  Renal/GU negative Renal ROS  negative genitourinary   Musculoskeletal  (+) Arthritis ,   Abdominal   Peds  Hematology negative hematology ROS (+)   Anesthesia Other Findings Past Medical History:   Hypertension                                                 Hypercholesterolemia                                         Irritable bowel syndrome                                     GERD (gastroesophageal reflux disease)                       Colonic polyp                                                Monoclonal gammopathy                                        Hyperkalemia                                                 COPD (chronic obstructive pulmonary disease) (*              Adrenal gland anomaly                                          Comment:enlargement   Diverticulosis  Depression                                                  Degenerative disc disease, lumbar                            Carpal tunnel syndrome                                       Personal history of tobacco use, presenting ha* 08/17/2015   CAD (coronary artery disease)                               Past Surgical History:   Boon                                  09/07        EYE SURGERY                                     Bilateral 2010           Comment:cataract   CARPAL TUNNEL RELEASE                           Left 2011           Comment:ulnar nerve sub muscular at elbow     Reproductive/Obstetrics negative OB ROS                            Anesthesia Physical Anesthesia Plan  ASA: IV  Anesthesia Plan: General   Post-op Pain Management:    Induction:   Airway Management Planned:   Additional Equipment:   Intra-op Plan:   Post-operative Plan:   Informed Consent: I have reviewed the patients History and Physical, chart, labs and discussed the procedure including the risks, benefits and alternatives for the proposed anesthesia with the patient or authorized representative who has indicated his/her understanding and acceptance.   Dental Advisory Given  Plan Discussed with: Anesthesiologist, CRNA and Surgeon  Anesthesia Plan Comments:         Anesthesia Quick Evaluation

## 2015-11-15 NOTE — Anesthesia Postprocedure Evaluation (Deleted)
Anesthesia Post Note  Patient: Troy Lopez  Procedure(s) Performed: Procedure(s) (LRB): CARDIOVERSION (N/A)  Patient location during evaluation: Other Anesthesia Type: General Level of consciousness: awake and alert Pain management: pain level controlled Vital Signs Assessment: post-procedure vital signs reviewed and stable Respiratory status: spontaneous breathing Cardiovascular status: blood pressure returned to baseline Anesthetic complications: no    Last Vitals:  Filed Vitals:   11/15/15 0647 11/15/15 0738  BP: 116/84 141/95  Pulse: 64 74  Temp: 36.5 C   Resp: 17 14    Last Pain: There were no vitals filed for this visit.               Rolla Plate P

## 2015-11-15 NOTE — Progress Notes (Signed)
Patient is awake, alert, oriented. Reporting no pain. Converted to NSR, pre and post CV ECG completed and on chart. VSS> Reviewed discharge instructions and follow up appointment with patient and wife. Will discharge per order to home with wife, escorted out via wheelchair.

## 2015-11-15 NOTE — Op Note (Signed)
[facility] Cardiology   11/15/2015                     7:45 AM  PATIENT:  Troy Lopez    PRE-OPERATIVE DIAGNOSIS:  Afib  POST-OPERATIVE DIAGNOSIS:  Same  PROCEDURE:  CARDIOVERSION  SURGEON:  Chi Garlow, MD    ANESTHESIA:     PREOPERATIVE INDICATIONS:  Troy Lopez is a  75 y.o. male with a diagnosis of Afib who failed conservative measures and elected for surgical management.    The risks benefits and alternatives were discussed with the patient preoperatively including but not limited to the risks of infection, bleeding, cardiopulmonary complications, the need for revision surgery, among others, and the patient was willing to proceed.   OPERATIVE PROCEDURE: The patient was brought to the special holding area in a fasting state. The patient intravenous propofol for sedation. The patient underwent successful cardioversion with 50 J to normal sinus rhythm. There were no periprocedural complications.

## 2015-11-15 NOTE — Transfer of Care (Signed)
Immediate Anesthesia Transfer of Care Note  Patient: Troy Lopez  Procedure(s) Performed: Procedure(s): CARDIOVERSION (N/A)  Patient Location: spu  Anesthesia Type:General  Level of Consciousness: awake  Airway & Oxygen Therapy: Patient Spontanous Breathing and Patient connected to nasal cannula oxygen  Post-op Assessment: Report given to RN  Post vital signs: Reviewed  Last Vitals:  Filed Vitals:   11/15/15 0647 11/15/15 0738  BP: 116/84 141/95  Pulse: 64 74  Temp: 36.5 C   Resp: 17 14    Complications: No apparent anesthesia complications

## 2015-11-16 ENCOUNTER — Other Ambulatory Visit: Payer: Self-pay | Admitting: Internal Medicine

## 2015-11-16 ENCOUNTER — Other Ambulatory Visit: Payer: Self-pay

## 2015-11-20 NOTE — Progress Notes (Signed)
PULMONARY OFFICE FOLLOW UP NOTE  Requesting MD/Service: Einar Pheasant, MD Date of initial consultation: 09/18/15 Reason for consultation: Dyspnea  PT PROFILE: 75 y.o. M smoker underwent initial evaluation for progressive dyspnea on 09/18/15 with finding of cardiac arrhythmia - probable atrial flutter with variable conduction and frequent PVCs. Initial impression: Likely COPD. Acute worsening of respiratory symptoms possibly due to cardiac arrhythmia. Case discussed with Dr Saralyn Pilar. Trial of Incruse initiated  NM stress 08/17/15:  1. Equivocal ETT. 2. Normal left ventricular function. 3. Normal wall motion. 4. No evidence for scar or ischemia  TTE 08/02/15:  NORMAL LEFT VENTRICULAR SYSTOLIC FUNCTION (LVEF A999333) WITH MODERATE LVH. MODERATE MITRAL REGURGITATION. MODERATE PHTN (PASP est 57 mmHg).  LA moderately dilated  09/22/15 PFTs: mild-mod obstruction. Normal TLC. Severe reduction DLCO  10/09/15 CXR: hyperinflation without acute findings  10/09/15 Routine follow up office visit to review results of CXR and PFTs. No discernible benefit from Incruse. Continued smoking - 5-6 cigs per day. Has undergone Holter monitor but does not know results. Has not yet been seen by Cardiology. Continued DOE. Also episodic severe light-headedness/pre-syncope not necessarily associated with dyspnea. PFTs revealed mild to moderate obstruction. CXR revealed emphysema with NAD. Encouraged F/U with cardiology. Encouraged smoking cessation. Trial of Breo and PRN albuterol - samples provided  10/09/15 ambulatory oximetry: no desaturation. Tachycardic and irregular immediately after ambulation  11/14/15 ROV. At first felt benefit from [facility] but now unable to tell. DCCV scheduled for 11/15/15. Still smokes 1-2 cigs/day  SUBJ: Initially felt benefit from [facility] and albuterol. Now unable to tell if it is helping. Continues to smoke 1-2 cigs per day. DCCV planned for 11/15/15. No new complaints  Filed Vitals:   11/14/15 1058  BP: 136/86  Pulse: 121  Height: 5\' 11"  (1.803 m)  Weight: 190 lb (86.183 kg)  SpO2: 98%     EXAM:  Gen: No overt respiratory distress HEENT: NCAT, oropharynx normal Neck: Supple without LAN, thyromegaly, JVD Lungs: breath sounds mildly diminished, percussion note normal, No wheezes Cardiovascular: IRIR, no murmurs noted Abdomen: Soft, nontender, normal BS Ext: without clubbing, cyanosis, edema Neuro: grossly intact  DATA:   BMP Latest Ref Rng 07/07/2015 06/12/2015 11/09/2014  Glucose 65 - 99 mg/dL 114(H) 102(H) 85  BUN 6 - 20 mg/dL 9 12 17   Creatinine 0.61 - 1.24 mg/dL 0.97 1.09 1.11  Sodium 135 - 145 mmol/L 130(L) 129(L) 129(L)  Potassium 3.5 - 5.1 mmol/L 4.5 4.8 4.8  Chloride 101 - 111 mmol/L 94(L) 94(L) 97  CO2 22 - 32 mmol/L 26 28 27   Calcium 8.9 - 10.3 mg/dL 9.4 10.2 9.9    CBC Latest Ref Rng 07/07/2015 06/27/2015 06/12/2015  WBC 3.8 - 10.6 K/uL 7.7 9.1 8.0  Hemoglobin 13.0 - 18.0 g/dL 13.4 13.5 13.6  Hematocrit 40.0 - 52.0 % 38.9(L) 40.7 40.7  Platelets 150 - 440 K/uL 406 436.0(H) 419.0(H)   No new CXR  IMPRESSION:   1) Smoker - previously heavy. Presently 4-5 cigs per day. 2) COPD, predominantly emphysema - mild-mod obstruction on PFTs. Little evidence of reversibility  3) Exertional dyspnea - overall appears to be commensurate with physiological abnomality on PFTs 4) Pulmonary hypertension noted incidentally on TTE - likely group 2/3 (see below) 5) Episodic lightheadedness. I am concerned that this is related to paroxysmal tachyarrhythmias which appear to be exercise induced   PLAN:  Smoking cessation was again discussed. Suggested 2 mg nicotine lozenges rather than 4 mg as the 4 mg strength was causing nausea Continue Breo inhaler  and PRN albuterol Follow up in 6-8 weeks to reassess breathing after cardioversion is done   Wilhelmina [name], MD [facility]

## 2015-11-22 DIAGNOSIS — Z9889 Other specified postprocedural states: Secondary | ICD-10-CM | POA: Diagnosis not present

## 2015-11-22 DIAGNOSIS — E7800 Pure hypercholesterolemia, unspecified: Secondary | ICD-10-CM | POA: Diagnosis not present

## 2015-11-22 DIAGNOSIS — R0789 Other chest pain: Secondary | ICD-10-CM | POA: Diagnosis not present

## 2015-11-22 DIAGNOSIS — J410 Simple chronic bronchitis: Secondary | ICD-10-CM | POA: Diagnosis not present

## 2015-11-22 DIAGNOSIS — G4733 Obstructive sleep apnea (adult) (pediatric): Secondary | ICD-10-CM | POA: Diagnosis not present

## 2015-11-22 DIAGNOSIS — Z720 Tobacco use: Secondary | ICD-10-CM | POA: Diagnosis not present

## 2015-11-22 DIAGNOSIS — I10 Essential (primary) hypertension: Secondary | ICD-10-CM | POA: Diagnosis not present

## 2015-12-04 DIAGNOSIS — S01502A Unspecified open wound of oral cavity, initial encounter: Secondary | ICD-10-CM | POA: Diagnosis not present

## 2015-12-05 DIAGNOSIS — D649 Anemia, unspecified: Secondary | ICD-10-CM | POA: Diagnosis not present

## 2015-12-05 DIAGNOSIS — B370 Candidal stomatitis: Secondary | ICD-10-CM | POA: Diagnosis not present

## 2015-12-05 DIAGNOSIS — K52831 Collagenous colitis: Secondary | ICD-10-CM | POA: Diagnosis not present

## 2015-12-14 DIAGNOSIS — K52831 Collagenous colitis: Secondary | ICD-10-CM | POA: Diagnosis not present

## 2015-12-14 DIAGNOSIS — D649 Anemia, unspecified: Secondary | ICD-10-CM | POA: Diagnosis not present

## 2015-12-22 ENCOUNTER — Other Ambulatory Visit: Payer: Self-pay | Admitting: Internal Medicine

## 2016-01-08 ENCOUNTER — Telehealth: Payer: Self-pay | Admitting: *Deleted

## 2016-01-08 NOTE — Telephone Encounter (Signed)
Pt was advised to call the office to have his appt.rescheduled from 01/12/16. He will need a morning appt. on Dr. Lars Mage schedule .

## 2016-01-12 ENCOUNTER — Ambulatory Visit: Payer: Self-pay | Admitting: Internal Medicine

## 2016-01-16 ENCOUNTER — Ambulatory Visit (INDEPENDENT_AMBULATORY_CARE_PROVIDER_SITE_OTHER): Payer: PPO | Admitting: Pulmonary Disease

## 2016-01-16 ENCOUNTER — Encounter: Payer: Self-pay | Admitting: Pulmonary Disease

## 2016-01-16 VITALS — BP 126/86 | HR 68 | Ht 64.0 in | Wt 190.0 lb

## 2016-01-16 DIAGNOSIS — J449 Chronic obstructive pulmonary disease, unspecified: Secondary | ICD-10-CM | POA: Diagnosis not present

## 2016-01-16 DIAGNOSIS — F17200 Nicotine dependence, unspecified, uncomplicated: Secondary | ICD-10-CM

## 2016-01-16 DIAGNOSIS — Z720 Tobacco use: Secondary | ICD-10-CM

## 2016-01-16 NOTE — Progress Notes (Signed)
PULMONARY OFFICE FOLLOW UP NOTE  Requesting MD/Service: Einar Pheasant, MD Date of initial consultation: 09/18/15 Reason for consultation: Dyspnea  PT PROFILE: 75 y.o. M smoker underwent initial evaluation for progressive dyspnea on 09/18/15 with finding of cardiac arrhythmia - probable atrial flutter with variable conduction and frequent PVCs. Initial impression: Likely COPD. Acute worsening of respiratory symptoms possibly due to cardiac arrhythmia. Case discussed with Dr Saralyn Pilar. Trial of Incruse initiated  NM stress 08/17/15: 1. Equivocal ETT. 2. Normal left ventricular function. 3. Normal wall motion. 4. No evidence for scar or ischemia  TTE 08/02/15: NORMAL LEFT VENTRICULAR SYSTOLIC FUNCTION (LVEF A999333) WITH MODERATE LVH. MODERATE MITRAL REGURGITATION. MODERATE PHTN (PASP est 57 mmHg).  LA moderately dilated  09/22/15 PFTs: mild-mod obstruction. Normal TLC. Severe reduction DLCO  10/09/15 CXR: hyperinflation without acute findings  10/09/15 Routine follow up office visit to review results of CXR and PFTs. No discernible benefit from Incruse. Continued smoking 5-6 cigs per day. Has undergone Holter monitor but does not know results. Has not yet been seen by Cardiology. Continued DOE. Also episodic severe light-headedness/pre-syncope not necessarily associated with dyspnea. PFTs revealed mild to moderate obstruction. CXR revealed emphysema with NAD. Encouraged F/U with cardiology. Encouraged smoking cessation. Trial of Breo and PRN albuterol - samples provided  10/09/15 ambulatory oximetry: no desaturation. Tachycardic and irregular immediately after ambulation  11/14/15 ROV. At first felt benefit from [facility] but now unable to tell. DCCV scheduled for 11/15/15. Still smokes 1-2 cigs/day  11/15/15: Successful DCCV ([facility])  SUBJ: Dyspnea is improved since last visit. Remains on Breo and PRN albuterol. Has not smoked since last week. Denies CP, fever, purulent sputum, hemoptysis, LE  edema and calf tenderness   Filed Vitals:   01/16/16 1053  BP: 126/86  Pulse: 68  Height: 5\' 4"  (1.626 m)  Weight: 190 lb (86.183 kg)  SpO2: 94%     EXAM:  Gen: No overt respiratory distress HEENT: NCAT, oropharynx normal Neck: Supple without LAN, thyromegaly, JVD Lungs: breath sounds mildly diminished, percussion note normal, No wheezes Cardiovascular: RRR, no murmurs noted Abdomen: Soft, nontender, normal BS Ext: without clubbing, cyanosis, edema Neuro: grossly intact  DATA:   BMP Latest Ref Rng 07/07/2015 06/12/2015 11/09/2014  Glucose 65 - 99 mg/dL 114(H) 102(H) 85  BUN 6 - 20 mg/dL 9 12 17   Creatinine 0.61 - 1.24 mg/dL 0.97 1.09 1.11  Sodium 135 - 145 mmol/L 130(L) 129(L) 129(L)  Potassium 3.5 - 5.1 mmol/L 4.5 4.8 4.8  Chloride 101 - 111 mmol/L 94(L) 94(L) 97  CO2 22 - 32 mmol/L 26 28 27   Calcium 8.9 - 10.3 mg/dL 9.4 10.2 9.9    CBC Latest Ref Rng 07/07/2015 06/27/2015 06/12/2015  WBC 3.8 - 10.6 K/uL 7.7 9.1 8.0  Hemoglobin 13.0 - 18.0 g/dL 13.4 13.5 13.6  Hematocrit 40.0 - 52.0 % 38.9(L) 40.7 40.7  Platelets 150 - 440 K/uL 406 436.0(H) 419.0(H)   No new CXR  IMPRESSION:   1) Smoker - Abstinent X 1 week. I congratulated him on this success and we discussed risk of relapse and strategies to prevent 2) COPD, predominantly emphysema - mild-mod obstruction on PFTs. 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

## 2016-01-17 ENCOUNTER — Ambulatory Visit (INDEPENDENT_AMBULATORY_CARE_PROVIDER_SITE_OTHER): Payer: PPO | Admitting: Internal Medicine

## 2016-01-17 ENCOUNTER — Encounter: Payer: Self-pay | Admitting: Internal Medicine

## 2016-01-17 VITALS — BP 130/80 | HR 63 | Temp 98.4°F | Resp 18 | Ht 64.0 in | Wt 190.4 lb

## 2016-01-17 DIAGNOSIS — D649 Anemia, unspecified: Secondary | ICD-10-CM | POA: Diagnosis not present

## 2016-01-17 DIAGNOSIS — I2510 Atherosclerotic heart disease of native coronary artery without angina pectoris: Secondary | ICD-10-CM

## 2016-01-17 DIAGNOSIS — M79606 Pain in leg, unspecified: Secondary | ICD-10-CM

## 2016-01-17 DIAGNOSIS — D472 Monoclonal gammopathy: Secondary | ICD-10-CM | POA: Diagnosis not present

## 2016-01-17 DIAGNOSIS — Z125 Encounter for screening for malignant neoplasm of prostate: Secondary | ICD-10-CM

## 2016-01-17 DIAGNOSIS — I10 Essential (primary) hypertension: Secondary | ICD-10-CM

## 2016-01-17 DIAGNOSIS — M549 Dorsalgia, unspecified: Secondary | ICD-10-CM

## 2016-01-17 DIAGNOSIS — E871 Hypo-osmolality and hyponatremia: Secondary | ICD-10-CM

## 2016-01-17 DIAGNOSIS — L989 Disorder of the skin and subcutaneous tissue, unspecified: Secondary | ICD-10-CM

## 2016-01-17 DIAGNOSIS — G4730 Sleep apnea, unspecified: Secondary | ICD-10-CM

## 2016-01-17 DIAGNOSIS — K219 Gastro-esophageal reflux disease without esophagitis: Secondary | ICD-10-CM

## 2016-01-17 DIAGNOSIS — Z87891 Personal history of nicotine dependence: Secondary | ICD-10-CM

## 2016-01-17 DIAGNOSIS — G8929 Other chronic pain: Secondary | ICD-10-CM

## 2016-01-17 DIAGNOSIS — K52831 Collagenous colitis: Secondary | ICD-10-CM

## 2016-01-17 MED ORDER — MUPIROCIN 2 % EX OINT: 1.0000 "application " | TOPICAL_OINTMENT | Freq: Two times a day (BID) | CUTANEOUS | Status: DC

## 2016-01-17 NOTE — Progress Notes (Signed)
Pre-visit discussion using our clinic review tool. No additional management support is needed unless otherwise documented below in the visit note.  

## 2016-01-17 NOTE — Progress Notes (Signed)
Patient ID: Troy Lopez, male   DOB: 1941-07-14, 75 y.o.   MRN: [id number]   Subjective:    Patient ID: Troy Lopez, male    DOB: 1940/09/04, 75 y.o.   MRN: [id number]  HPI  Patient here for a scheduled follow up.  He has seen GI.  Diagnosed with collagenous colitis.  Using entocort.  Bowels are doing better.  He is seeing pulmonary.  Memory Dance has helped his breathing.  No increased heart rate or palpitations.  No nausea or vomiting.  Is concerned about some skin lesion.  Declines referral to dermatology at this time.  Will notify me when agreeable.  Increased pain back.  Pain in legs as well.  No urine change.     Past Medical History  Diagnosis Date  . Hypertension   . Hypercholesterolemia   . Irritable bowel syndrome   . GERD (gastroesophageal reflux disease)   . Colonic polyp   . Monoclonal gammopathy   . Hyperkalemia   . COPD (chronic obstructive pulmonary disease) ([place])   . Adrenal gland anomaly     enlargement  . Diverticulosis   . Depression   . Degenerative disc disease, lumbar   . Carpal tunnel syndrome   . Personal history of tobacco use, presenting hazards to health 08/17/2015  . CAD (coronary artery disease)    Past Surgical History  Procedure Laterality Date  . Bunionectomy  1989  . Hemorrhoid surgery    . Cholecystectomy  09/07  . Eye surgery Bilateral 2010    cataract  . Carpal tunnel release Left 2011    ulnar nerve sub muscular at elbow  . Electrophysiologic study N/A 11/15/2015    Procedure: CARDIOVERSION;  Surgeon: Isaias Cowman, MD;  Location: [facility] ORS;  Service: Cardiovascular;  Laterality: N/A;   Family History  Problem Relation Age of Onset  . Stroke Mother   . Heart disease Father     MI - 77   . Colon cancer Neg Hx   . Prostate cancer Neg Hx    Social History   Social History  . Marital Status: Married    Spouse Name: N/A  . Number of Children: 3  . Years of Education: N/A   Social History Main Topics  . Smoking status: Former  Smoker    Quit date: 11/01/2015  . Smokeless tobacco: Never Used     Comment: about 2 cigarettes per day, trying to quit  . Alcohol Use: 25.2 oz/week    0 Standard drinks or equivalent, 42 Cans of beer per week     Comment: occas  . Drug Use: No  . Sexual Activity: Not Asked   Other Topics Concern  . None   Social History Narrative    Outpatient Encounter Prescriptions as of 01/17/2016  Medication Sig  . acetaminophen (TYLENOL) 325 MG tablet Take 650 mg by mouth every 6 (six) hours as needed for pain.  Marland Kitchen amLODipine (NORVASC) 5 MG tablet Take 1 tablet (5 mg total) by mouth daily.  . budesonide (ENTOCORT EC) 3 MG 24 hr capsule Take by mouth.  . cloNIDine (CATAPRES) 0.1 MG tablet Take by mouth.  . DULoxetine (CYMBALTA) 60 MG capsule Take 1 capsule (60 mg total) by mouth daily.  . fluticasone (FLONASE) 50 MCG/ACT nasal spray Place 2 sprays into both nostrils daily.  . fluticasone furoate-vilanterol (BREO ELLIPTA) 100-25 MCG/INH AEPB Inhale 1 puff into the lungs daily.  . furosemide (LASIX) 20 MG tablet Take 1 tablet (20 mg total) by mouth  daily as needed.  . gabapentin (NEURONTIN) 100 MG capsule Take two each morning, two each afternoon and three at bedtime  . IRON PO Take by mouth.  . isosorbide mononitrate (IMDUR) 30 MG 24 hr tablet Take by mouth.  . loperamide (IMODIUM) 2 MG capsule Take 2 mg by mouth daily as needed.  Marland Kitchen losartan (COZAAR) 100 MG tablet Take 1 tablet (100 mg total) by mouth daily.  . metoprolol (LOPRESSOR) 100 MG tablet Take 1 tablet (100 mg total) by mouth 2 (two) times daily.  . pantoprazole (PROTONIX) 40 MG tablet Take 1 tablet (40 mg total) by mouth 2 (two) times daily.  . traMADol (ULTRAM) 50 MG tablet TAKE (1) TABLET EVERY SIX HOURS AS NEEDED.  Marland Kitchen zolpidem (AMBIEN) 5 MG tablet Take one tablet before sleep study.  May repeat x 1 at study if needed.  . [DISCONTINUED] albuterol (PROVENTIL HFA;VENTOLIN HFA) 108 (90 Base) MCG/ACT inhaler Inhale 2 puffs into the lungs  every 6 (six) hours as needed for wheezing or shortness of breath.  . mupirocin ointment (BACTROBAN) 2 % Place 1 application into the nose 2 (two) times daily.  . ondansetron (ZOFRAN) 8 MG tablet Take 8 mg by mouth every 8 (eight) hours as needed. Reported on 01/17/2016   No facility-administered encounter medications on file as of 01/17/2016.    Review of Systems  Constitutional: Negative for appetite change and unexpected weight change.  HENT: Negative for congestion and sinus pressure.   Respiratory: Negative for cough and chest tightness.        Breathing stable.  Better on Breo.   Cardiovascular: Negative for chest pain, palpitations and leg swelling.  Gastrointestinal: Negative for nausea, vomiting and abdominal pain.  Genitourinary: Negative for dysuria and difficulty urinating.  Musculoskeletal: Positive for back pain.       Leg pain.   Skin: Negative for color change and rash.  Neurological: Negative for dizziness, light-headedness and headaches.  Psychiatric/Behavioral: Negative for dysphoric mood and agitation.       Mentally feeling better.         Objective:    Physical Exam  Constitutional: He appears well-developed and well-nourished. No distress.  HENT:  Nose: Nose normal.  Mouth/Throat: Oropharynx is clear and moist.  Neck: Neck supple. No thyromegaly present.  Cardiovascular: Normal rate and regular rhythm.   Pulmonary/Chest: Effort normal and breath sounds normal. No respiratory distress.  Abdominal: Soft. Bowel sounds are normal. There is no tenderness.  Musculoskeletal: He exhibits no edema or tenderness.  Lymphadenopathy:    He has no cervical adenopathy.  Skin: No rash noted. No erythema.  Psychiatric: He has a normal mood and affect. His behavior is normal.    BP 130/80 mmHg  Pulse 63  Temp(Src) 98.4 F (36.9 C) (Oral)  Resp 18  Ht 5\' 4"  (1.626 m)  Wt 190 lb 6 oz (86.354 kg)  BMI 32.66 kg/m2  SpO2 94% Wt Readings from Last 3 Encounters:    01/17/16 190 lb 6 oz (86.354 kg)  01/16/16 190 lb (86.183 kg)  11/15/15 190 lb (86.183 kg)     Lab Results  Component Value Date   WBC 7.7 07/07/2015   HGB 13.4 07/07/2015   HCT 38.9* 07/07/2015   PLT 406 07/07/2015   GLUCOSE 114* 07/07/2015   CHOL 171 06/12/2015   TRIG 72.0 06/12/2015   HDL 77.10 06/12/2015   LDLCALC 80 06/12/2015   ALT 11 06/12/2015   AST 14 06/12/2015   NA 130* 07/07/2015  K 4.5 07/07/2015   CL 94* 07/07/2015   CREATININE 0.97 07/07/2015   BUN 9 07/07/2015   CO2 26 07/07/2015   TSH 1.75 06/12/2015   PSA 1.24 11/09/2014       Assessment & Plan:   Problem List Items Addressed This Visit    Anemia - Primary    Has had extensive GI w/up.  Has seen Dr Grayland Ormond.  Follow cbc.       Relevant Medications   IRON PO   CAD (coronary artery disease)    Followed by cardiology.  Negative stress test.  S/p ablation.  Currently doing well.  Follow.       Relevant Orders   Lipid panel   Hepatic function panel   Chronic back pain    On cymbalta.        Collagenous colitis    Bowels better.  Using entocort.  Colonoscopy 08/25/14 - as outlined.        GERD (gastroesophageal reflux disease)    Just had EGD.  Upper symptoms controlled with protonix.       Hypertension    Blood pressure under good control.  Continue same medication regimen.  Follow pressures.  Follow metabolic panel.        Relevant Orders   Basic metabolic panel   Hyponatremia    Stable.  Has been worked up previously by nephrology.  Recheck sodium with next labs.       Relevant Orders   Basic metabolic panel   Leg pain    With leg pain.  Stable.  Tylenol.  Follow.        Monoclonal gammopathy    Saw Dr Grayland Ormond.  Stable.  Recommended f/u SIEP in 6-12 months.        Relevant Orders   Protein Electrophoresis, (serum)   Personal history of tobacco use, presenting hazards to health    Has decreased.  Discussed the need to quit.        Skin lesion    Wants to hold on  referral to dermatology.       Sleep apnea    CPAP.        Other Visit Diagnoses    Prostate cancer screening        Relevant Orders    PSA, Medicare        Einar Pheasant, MD

## 2016-01-21 ENCOUNTER — Encounter: Payer: Self-pay | Admitting: Internal Medicine

## 2016-01-21 DIAGNOSIS — L989 Disorder of the skin and subcutaneous tissue, unspecified: Secondary | ICD-10-CM | POA: Insufficient documentation

## 2016-01-21 NOTE — Assessment & Plan Note (Signed)
Just had EGD.  Upper symptoms controlled with protonix.

## 2016-01-21 NOTE — Assessment & Plan Note (Signed)
On cymbalta

## 2016-01-21 NOTE — Assessment & Plan Note (Signed)
With leg pain.  Stable.  Tylenol.  Follow.

## 2016-01-21 NOTE — Assessment & Plan Note (Signed)
Wants to hold on referral to dermatology.

## 2016-01-21 NOTE — Assessment & Plan Note (Signed)
Bowels better.  Using entocort.  Colonoscopy 08/25/14 - as outlined.

## 2016-01-21 NOTE — Assessment & Plan Note (Signed)
Has had extensive GI w/up.  Has seen Dr Grayland Ormond.  Follow cbc.

## 2016-01-21 NOTE — Assessment & Plan Note (Signed)
Has decreased.  Discussed the need to quit.

## 2016-01-21 NOTE — Assessment & Plan Note (Signed)
Blood pressure under good control.  Continue same medication regimen.  Follow pressures.  Follow metabolic panel.   

## 2016-01-21 NOTE — Assessment & Plan Note (Signed)
Stable.  Has been worked up previously by nephrology.  Recheck sodium with next labs.

## 2016-01-21 NOTE — Assessment & Plan Note (Signed)
CPAP.  

## 2016-01-21 NOTE — Assessment & Plan Note (Signed)
Saw Dr Grayland Ormond.  Stable.  Recommended f/u SIEP in 6-12 months.

## 2016-01-21 NOTE — Assessment & Plan Note (Signed)
Followed by cardiology.  Negative stress test.  S/p ablation.  Currently doing well.  Follow.

## 2016-01-29 ENCOUNTER — Encounter: Payer: Self-pay | Admitting: Internal Medicine

## 2016-01-30 NOTE — Telephone Encounter (Signed)
Unread mychart message mailed to patient 

## 2016-01-31 DIAGNOSIS — L02821 Furuncle of head [any part, except face]: Secondary | ICD-10-CM | POA: Diagnosis not present

## 2016-01-31 DIAGNOSIS — C44319 Basal cell carcinoma of skin of other parts of face: Secondary | ICD-10-CM | POA: Diagnosis not present

## 2016-01-31 DIAGNOSIS — D485 Neoplasm of uncertain behavior of skin: Secondary | ICD-10-CM | POA: Diagnosis not present

## 2016-01-31 DIAGNOSIS — L0202 Furuncle of face: Secondary | ICD-10-CM | POA: Diagnosis not present

## 2016-02-14 ENCOUNTER — Other Ambulatory Visit (INDEPENDENT_AMBULATORY_CARE_PROVIDER_SITE_OTHER): Payer: PPO

## 2016-02-14 DIAGNOSIS — E871 Hypo-osmolality and hyponatremia: Secondary | ICD-10-CM | POA: Diagnosis not present

## 2016-02-14 DIAGNOSIS — I2510 Atherosclerotic heart disease of native coronary artery without angina pectoris: Secondary | ICD-10-CM | POA: Diagnosis not present

## 2016-02-14 DIAGNOSIS — D472 Monoclonal gammopathy: Secondary | ICD-10-CM

## 2016-02-14 DIAGNOSIS — Z125 Encounter for screening for malignant neoplasm of prostate: Secondary | ICD-10-CM | POA: Diagnosis not present

## 2016-02-14 DIAGNOSIS — I10 Essential (primary) hypertension: Secondary | ICD-10-CM

## 2016-02-14 LAB — LIPID PANEL
Cholesterol: 134 mg/dL (ref 0–200)
HDL: 74.8 mg/dL (ref >39.00)
LDL Cholesterol: 51 mg/dL (ref 0–99)
NONHDL: 59.6
Total CHOL/HDL Ratio: 2
Triglycerides: 42 mg/dL (ref 0.0–149.0)
VLDL: 8.4 mg/dL (ref 0.0–40.0)

## 2016-02-14 LAB — BASIC METABOLIC PANEL
BUN: 10 mg/dL (ref 6–23)
CO2: 29 meq/L (ref 19–32)
CREATININE: 0.94 mg/dL (ref 0.40–1.50)
Calcium: 9.8 mg/dL (ref 8.4–10.5)
Chloride: 90 mEq/L — ABNORMAL LOW (ref 96–112)
GFR: 83.4 mL/min (ref >60.00)
GLUCOSE: 116 mg/dL — AB (ref 70–99)
Potassium: 4.8 mEq/L (ref 3.5–5.1)
Sodium: 127 mEq/L — ABNORMAL LOW (ref 135–145)

## 2016-02-14 LAB — HEPATIC FUNCTION PANEL
ALK PHOS: 79 U/L (ref 39–117)
ALT: 10 U/L (ref 0–53)
AST: 14 U/L (ref 0–37)
Albumin: 3.4 g/dL — ABNORMAL LOW (ref 3.5–5.2)
BILIRUBIN DIRECT: 0 mg/dL (ref 0.0–0.3)
BILIRUBIN TOTAL: 0.7 mg/dL (ref 0.2–1.2)
Total Protein: 8.5 g/dL — ABNORMAL HIGH (ref 6.0–8.3)

## 2016-02-14 LAB — PSA, MEDICARE: PSA: 1.33 ng/ml (ref 0.10–4.00)

## 2016-02-15 ENCOUNTER — Ambulatory Visit (INDEPENDENT_AMBULATORY_CARE_PROVIDER_SITE_OTHER): Payer: PPO | Admitting: Family Medicine

## 2016-02-15 ENCOUNTER — Encounter: Payer: Self-pay | Admitting: Family Medicine

## 2016-02-15 ENCOUNTER — Telehealth: Payer: Self-pay | Admitting: Family Medicine

## 2016-02-15 ENCOUNTER — Ambulatory Visit (INDEPENDENT_AMBULATORY_CARE_PROVIDER_SITE_OTHER): Payer: PPO

## 2016-02-15 ENCOUNTER — Other Ambulatory Visit: Payer: Self-pay | Admitting: Internal Medicine

## 2016-02-15 VITALS — BP 148/82 | HR 76 | Temp 98.0°F | Ht 64.0 in | Wt 192.4 lb

## 2016-02-15 DIAGNOSIS — R059 Cough, unspecified: Secondary | ICD-10-CM

## 2016-02-15 DIAGNOSIS — R05 Cough: Secondary | ICD-10-CM

## 2016-02-15 DIAGNOSIS — J449 Chronic obstructive pulmonary disease, unspecified: Secondary | ICD-10-CM | POA: Diagnosis not present

## 2016-02-15 DIAGNOSIS — E871 Hypo-osmolality and hyponatremia: Secondary | ICD-10-CM

## 2016-02-15 DIAGNOSIS — J189 Pneumonia, unspecified organism: Secondary | ICD-10-CM

## 2016-02-15 DIAGNOSIS — R739 Hyperglycemia, unspecified: Secondary | ICD-10-CM

## 2016-02-15 MED ORDER — DOXYCYCLINE HYCLATE 100 MG PO TABS: 100.0000 mg | ORAL_TABLET | Freq: Two times a day (BID) | ORAL | Status: DC

## 2016-02-15 NOTE — Progress Notes (Signed)
Patient ID: Troy Lopez, male   DOB: [birth year], 75 y.o.   MRN: [id number]  Tommi Rumps, MD Phone: [phone]  Troy Lopez is a 75 y.o. male who presents today for same-day visit.  Patient reports symptoms started on Saturday with sore throat followed by cough and ear fullness. No sinus congestion. Does note chest congestion and cough productive of yellowish mucus. No shortness of breath. No chest pressure. Noted earlier this week a sharp pain across his bilateral lower ribs with cough and deep breaths though this is gone away. No hemoptysis. Has a history of emphysema though has not had to use his rescue inhaler. No fevers. Is currently taking an oral steroid for his collagenous colitis.  PMH: Former smoker   ROS see history of present illness  Objective  Physical Exam Filed Vitals:   02/15/16 1016  BP: 148/82  Pulse: 76  Temp: 98 F (36.7 C)    BP Readings from Last 3 Encounters:  02/15/16 148/82  01/17/16 130/80  01/16/16 126/86   Wt Readings from Last 3 Encounters:  02/15/16 192 lb 6.4 oz (87.272 kg)  01/17/16 190 lb 6 oz (86.354 kg)  01/16/16 190 lb (86.183 kg)    Physical Exam  Constitutional: No distress.  HENT:  Head: Normocephalic and atraumatic.  Right Ear: External ear normal.  Left Ear: External ear normal.  Mouth/Throat: Oropharynx is clear and moist. No oropharyngeal exudate.  Left TM mildly erythematous and no purulent fluid, right TM normal  Eyes: Conjunctivae are normal. Pupils are equal, round, and reactive to light.  Cardiovascular: Normal rate, regular rhythm and normal heart sounds.   Pulmonary/Chest: Effort normal. No respiratory distress. He has no wheezes.  Coarse breath sounds greater on right lower and mid lung fields, mildly coarse in left lower lung field, upper lung fields clear  Neurological: He is alert. Gait normal.  Skin: Skin is warm and dry. He is not diaphoretic.     Assessment/Plan: Please see individual problem  list.  Cough Suspect symptoms are related to emphysema exacerbation given his increased cough productive of yellow sputum and breath sounds. Could be related to pneumonia or bronchitis as well. We will obtain a chest x-ray. He will be started on doxycycline. He is currently taking oral steroids. We will have him use his albuterol inhaler every 6 hours for the next several days and then as needed. He will continue to monitor. He is given return precautions.    Orders Placed This Encounter  Procedures  . DG Chest 2 View    Standing Status: Future     Number of Occurrences:      Standing Expiration Date: 04/16/2017    Order Specific Question:  Reason for Exam (SYMPTOM  OR DIAGNOSIS REQUIRED)    Answer:  cough, coarse breath sounds R>L    Order Specific Question:  Preferred imaging location?    Answer:  [facility] ordered this encounter  Medications  . doxycycline (VIBRA-TABS) 100 MG tablet    Sig: Take 1 tablet (100 mg total) by mouth 2 (two) times daily.    Dispense:  14 tablet    Refill:  0   Tommi Rumps, MD [place]

## 2016-02-15 NOTE — Progress Notes (Signed)
Order placed for f/u labs.  

## 2016-02-15 NOTE — Assessment & Plan Note (Signed)
Suspect symptoms are related to emphysema exacerbation given his increased cough productive of yellow sputum and breath sounds. Could be related to pneumonia or bronchitis as well. We will obtain a chest x-ray. He will be started on doxycycline. He is currently taking oral steroids. We will have him use his albuterol inhaler every 6 hours for the next several days and then as needed. He will continue to monitor. He is given return precautions.

## 2016-02-15 NOTE — Progress Notes (Signed)
Pre visit review using our clinic review tool, if applicable. No additional management support is needed unless otherwise documented below in the visit note. 

## 2016-02-15 NOTE — Telephone Encounter (Signed)
Attempted to call the patient regarding x-ray results. There is no answer. I left a message asking him to call back to the office.

## 2016-02-15 NOTE — Patient Instructions (Signed)
Nice to meet you. Your symptoms could be related to bronchitis or emphysema exacerbation. We will treat you with doxycycline. We'll obtain a chest x-ray and call with the results. You will continue the current steroid that you are on. If you develop chest pain, shortness of breath, cough productive of blood, fevers, or any new or changing symptoms please seek medical attention immediately.

## 2016-02-16 LAB — PROTEIN ELECTROPHORESIS, SERUM
ALBUMIN ELP: 3.3 g/dL — AB (ref 3.8–4.8)
Abnormal Protein Band1: 1.8 g/dL
Alpha-1-Globulin: 0.6 g/dL — ABNORMAL HIGH (ref 0.2–0.3)
Alpha-2-Globulin: 0.8 g/dL (ref 0.5–0.9)
Beta 2: 0.2 g/dL (ref 0.2–0.5)
Beta Globulin: 0.9 g/dL — ABNORMAL HIGH (ref 0.4–0.6)
Gamma Globulin: 2.3 g/dL — ABNORMAL HIGH (ref 0.8–1.7)
TOTAL PROTEIN, SERUM ELECTROPHOR: 8 g/dL (ref 6.1–8.1)

## 2016-02-16 NOTE — Telephone Encounter (Signed)
Spoke with patient regarding x-ray results. Advised there is pneumonia. He should continue doxycycline. Reports feels mildly better. We will need to recheck an x-ray in 3-4 weeks and I'll have nursing call to get him scheduled for this. Discussed return precautions. He'll call us on Monday if his symptoms are not improving.

## 2016-02-19 NOTE — Addendum Note (Signed)
Addended by: Leone Haven on: 02/19/2016 04:55 PM   Modules accepted: Orders

## 2016-02-19 NOTE — Telephone Encounter (Signed)
Order placed

## 2016-02-19 NOTE — Telephone Encounter (Signed)
Patient is going to come in on July 26 th at 10 AM to have repeat x ray. Please place order

## 2016-02-21 DIAGNOSIS — C44319 Basal cell carcinoma of skin of other parts of face: Secondary | ICD-10-CM | POA: Diagnosis not present

## 2016-02-22 DIAGNOSIS — I10 Essential (primary) hypertension: Secondary | ICD-10-CM | POA: Diagnosis not present

## 2016-02-22 DIAGNOSIS — Z720 Tobacco use: Secondary | ICD-10-CM | POA: Diagnosis not present

## 2016-02-22 DIAGNOSIS — Z9889 Other specified postprocedural states: Secondary | ICD-10-CM | POA: Diagnosis not present

## 2016-02-22 DIAGNOSIS — I484 Atypical atrial flutter: Secondary | ICD-10-CM | POA: Diagnosis not present

## 2016-02-22 DIAGNOSIS — R0789 Other chest pain: Secondary | ICD-10-CM | POA: Diagnosis not present

## 2016-02-22 DIAGNOSIS — J410 Simple chronic bronchitis: Secondary | ICD-10-CM | POA: Diagnosis not present

## 2016-02-22 DIAGNOSIS — G4733 Obstructive sleep apnea (adult) (pediatric): Secondary | ICD-10-CM | POA: Diagnosis not present

## 2016-02-29 ENCOUNTER — Other Ambulatory Visit: Payer: Self-pay

## 2016-03-04 ENCOUNTER — Other Ambulatory Visit (INDEPENDENT_AMBULATORY_CARE_PROVIDER_SITE_OTHER): Payer: PPO

## 2016-03-04 ENCOUNTER — Other Ambulatory Visit: Payer: Self-pay | Admitting: Internal Medicine

## 2016-03-04 DIAGNOSIS — E871 Hypo-osmolality and hyponatremia: Secondary | ICD-10-CM | POA: Diagnosis not present

## 2016-03-04 DIAGNOSIS — R739 Hyperglycemia, unspecified: Secondary | ICD-10-CM

## 2016-03-04 LAB — SODIUM: SODIUM: 127 meq/L — AB (ref 135–145)

## 2016-03-04 LAB — HEMOGLOBIN A1C: HEMOGLOBIN A1C: 5.7 % (ref 4.6–6.5)

## 2016-03-04 NOTE — Progress Notes (Signed)
Order placed for f/u sodium.  ?

## 2016-03-05 ENCOUNTER — Encounter: Payer: Self-pay | Admitting: *Deleted

## 2016-03-05 LAB — GLUCOSE, FASTING: GLUCOSE, FASTING: 97 mg/dL (ref 65–99)

## 2016-03-11 DIAGNOSIS — H903 Sensorineural hearing loss, bilateral: Secondary | ICD-10-CM | POA: Diagnosis not present

## 2016-03-11 DIAGNOSIS — H93A2 Pulsatile tinnitus, left ear: Secondary | ICD-10-CM | POA: Diagnosis not present

## 2016-03-13 ENCOUNTER — Ambulatory Visit (INDEPENDENT_AMBULATORY_CARE_PROVIDER_SITE_OTHER): Payer: PPO

## 2016-03-13 DIAGNOSIS — J189 Pneumonia, unspecified organism: Secondary | ICD-10-CM | POA: Diagnosis not present

## 2016-03-29 ENCOUNTER — Other Ambulatory Visit: Payer: Self-pay | Admitting: Internal Medicine

## 2016-03-29 NOTE — Telephone Encounter (Signed)
Please confirm with pt how often he is taking and if he is still on cymbalta.  Let me know.

## 2016-03-29 NOTE — Telephone Encounter (Signed)
ok'd rx for tramadol #60 with no refills.

## 2016-03-29 NOTE — Telephone Encounter (Signed)
Last time patient refilled medication was on [id number].  He just started taking it again for his Back PX.  He has not being taking it that much over the past two months.

## 2016-03-29 NOTE — Telephone Encounter (Signed)
Refill request for Tramadol, last seen [id number], last filled [id number].  Please advise.

## 2016-04-01 ENCOUNTER — Ambulatory Visit (INDEPENDENT_AMBULATORY_CARE_PROVIDER_SITE_OTHER): Payer: PPO | Admitting: Pulmonary Disease

## 2016-04-01 ENCOUNTER — Encounter: Payer: Self-pay | Admitting: Pulmonary Disease

## 2016-04-01 ENCOUNTER — Other Ambulatory Visit
Admission: RE | Admit: 2016-04-01 | Discharge: 2016-04-01 | Disposition: A | Discharge status: Home / Self Care | Payer: PPO | Source: Ambulatory Visit | Attending: Pulmonary Disease | Admitting: Pulmonary Disease

## 2016-04-01 VITALS — BP 132/88 | HR 73 | Ht 71.5 in | Wt 195.0 lb

## 2016-04-01 DIAGNOSIS — Z720 Tobacco use: Secondary | ICD-10-CM | POA: Diagnosis not present

## 2016-04-01 DIAGNOSIS — F17200 Nicotine dependence, unspecified, uncomplicated: Secondary | ICD-10-CM

## 2016-04-01 DIAGNOSIS — R5383 Other fatigue: Secondary | ICD-10-CM

## 2016-04-01 DIAGNOSIS — J449 Chronic obstructive pulmonary disease, unspecified: Secondary | ICD-10-CM | POA: Diagnosis not present

## 2016-04-01 LAB — CBC WITH DIFFERENTIAL/PLATELET
BASOS ABS: 0.1 10*3/uL (ref 0–0.1)
BASOS PCT: 1 %
Eosinophils Absolute: 0 10*3/uL (ref 0–0.7)
Eosinophils Relative: 0 %
HEMATOCRIT: 32.2 % — AB (ref 40.0–52.0)
HEMOGLOBIN: 10.9 g/dL — AB (ref 13.0–18.0)
LYMPHS PCT: 23 %
Lymphs Abs: 1.8 10*3/uL (ref 1.0–3.6)
MCH: 34.1 pg — ABNORMAL HIGH (ref 26.0–34.0)
MCHC: 33.9 g/dL (ref 32.0–36.0)
MCV: 100.5 fL — AB (ref 80.0–100.0)
MONO ABS: 0.8 10*3/uL (ref 0.2–1.0)
Monocytes Relative: 10 %
NEUTROS ABS: 5.2 10*3/uL (ref 1.4–6.5)
NEUTROS PCT: 66 %
Platelets: 448 10*3/uL — ABNORMAL HIGH (ref 150–440)
RBC: 3.2 MIL/uL — AB (ref 4.40–5.90)
RDW: 15.1 % — AB (ref 11.5–14.5)
WBC: 7.9 10*3/uL (ref 3.8–10.6)

## 2016-04-01 LAB — BASIC METABOLIC PANEL
Anion gap: 9 (ref 5–15)
BUN: 12 mg/dL (ref 6–20)
CALCIUM: 9.5 mg/dL (ref 8.9–10.3)
CO2: 24 mmol/L (ref 22–32)
CREATININE: 1.22 mg/dL (ref 0.61–1.24)
Chloride: 96 mmol/L — ABNORMAL LOW (ref 101–111)
GFR calc Af Amer: >60 mL/min (ref >60)
GFR, EST NON AFRICAN AMERICAN: 57 mL/min — AB (ref >60)
GLUCOSE: 97 mg/dL (ref 65–99)
Potassium: 5 mmol/L (ref 3.5–5.1)
Sodium: 129 mmol/L — ABNORMAL LOW (ref 135–145)

## 2016-04-01 MED ORDER — FLUTICASONE FUROATE-VILANTEROL 100-25 MCG/INH IN AEPB: 1.0000 | INHALATION_SPRAY | Freq: Every day | RESPIRATORY_TRACT | 0 refills | Status: AC

## 2016-04-01 NOTE — Patient Instructions (Signed)
In evaluation of fatigue I have ordered blood work today (CBC, BMET) Follow up in 3 months with chest Xray

## 2016-04-02 ENCOUNTER — Telehealth: Payer: Self-pay | Admitting: Pulmonary Disease

## 2016-04-02 ENCOUNTER — Other Ambulatory Visit: Payer: Self-pay

## 2016-04-02 NOTE — Telephone Encounter (Signed)
Pt has a question regarding the forms that were given to him regarding Breo. Please call.

## 2016-04-02 NOTE — Telephone Encounter (Signed)
Answered pt's questions in regards to the [place] forms I gave him at his appt. Nothing further needed.

## 2016-04-04 ENCOUNTER — Telehealth: Payer: Self-pay | Admitting: Pulmonary Disease

## 2016-04-04 NOTE — Telephone Encounter (Signed)
Patient brought in medication assistance appliaction.  Placed in [place]. Box.

## 2016-04-06 ENCOUNTER — Encounter: Payer: Self-pay | Admitting: Pulmonary Disease

## 2016-04-06 ENCOUNTER — Encounter: Payer: Self-pay | Admitting: Internal Medicine

## 2016-04-06 NOTE — Progress Notes (Signed)
PULMONARY OFFICE FOLLOW UP NOTE  Requesting MD/Service: Einar Pheasant, MD Date of initial consultation: 09/18/15 Reason for consultation: Dyspnea  PT PROFILE: 75 y.o. M smoker underwent initial evaluation for progressive dyspnea on 09/18/15 with finding of cardiac arrhythmia - probable atrial flutter with variable conduction and frequent PVCs. Initial impression: Likely COPD. Acute worsening of respiratory symptoms possibly due to cardiac arrhythmia. Case discussed with Dr Saralyn Pilar. Trial of Incruse initiated  NM stress 08/17/15: 1. Equivocal ETT. 2. Normal left ventricular function. 3. Normal wall motion. 4. No evidence for scar or ischemia  TTE 08/02/15: NORMAL LEFT VENTRICULAR SYSTOLIC FUNCTION (LVEF [id number]) WITH MODERATE LVH. MODERATE MITRAL REGURGITATION. MODERATE PHTN (PASP est 57 mmHg).  LA moderately dilated  09/22/15 PFTs: mild-mod obstruction. Normal TLC. Severe reduction DLCO  10/09/15 CXR: hyperinflation without acute findings  10/09/15 Routine follow up office visit to review results of CXR and PFTs. No discernible benefit from Incruse. Continued smoking 5-6 cigs per day. Continued DOE. Cardiology evaluation underway. Encouraged F/U with cardiology. Encouraged smoking cessation. Trial of Breo and PRN albuterol - samples provided  10/09/15 ambulatory oximetry: no desaturation. Tachycardic and irregular immediately after ambulation  11/14/15 ROV. At first felt benefit from [facility] but now unable to tell. DCCV scheduled for 11/15/15. Still smokes 1-2 cigs/day  11/15/15: Successful DCCV ([place])  01/16/16: Dyspnea improved on Breo. Abstinent from cigs X 7 days  04/01/16: Relapsed smoking but again abstinent X 10 days. Using nicotine lozenges. Dyspnea unchanged. Fatigue is chief complaint  SUBJ: Dyspnea unchanged. Relapsed on smoking but now again abstinent X 10 days with assistance of nicotine lozenges. Reports feeling "lousy" by which he means generally fatigued. Denies CP,  fever, purulent sputum, hemoptysis, LE edema and calf tenderness   Vitals:   04/01/16 1017  BP: 132/88  Pulse: 73  SpO2: 96%  Weight: 195 lb (88.5 kg)  Height: 5' 11.5" (1.816 m)     EXAM:  Gen: No overt respiratory distress HEENT: NCAT, oropharynx normal Neck: Supple without LAN, thyromegaly, JVD Lungs: breath sounds mildly diminished, no wheezes Cardiovascular: RRR, no murmurs noted Abdomen: Soft, nontender, normal BS Ext: without clubbing, cyanosis, edema Neuro: grossly intact  DATA:   BMP Latest Ref Rng & Units 04/01/2016 03/04/2016 02/14/2016  Glucose 65 - 99 mg/dL 97 - 116(H)  BUN 6 - 20 mg/dL 12 - 10  Creatinine 0.61 - 1.24 mg/dL 1.22 - 0.94  Sodium 135 - 145 mmol/L 129(L) 127(L) 127(L)  Potassium 3.5 - 5.1 mmol/L 5.0 - 4.8  Chloride 101 - 111 mmol/L 96(L) - 90(L)  CO2 22 - 32 mmol/L 24 - 29  Calcium 8.9 - 10.3 mg/dL 9.5 - 9.8    CBC Latest Ref Rng & Units 04/01/2016 07/07/2015 06/27/2015  WBC 3.8 - 10.6 K/uL 7.9 7.7 9.1  Hemoglobin 13.0 - 18.0 g/dL 10.9(L) 13.4 13.5  Hematocrit 40.0 - 52.0 % 32.2(L) 38.9(L) 40.7  Platelets 150 - 440 K/uL 448(H) 406 436.0(H)   CXR 03/13/16: NACPD. Chronic changes of COPD  IMPRESSION:   1) Smoker  2) COPD, predominantly emphysema - mild-mod obstruction  3) Profound fatigue - TSH was normal in 05/2015   PLAN:  Continue Breo inhaler and PRN albuterol Continue efforts at smoking abstinence CBC, BMET today Follow up in 3 months with CXR  Merton Border, MD PCCM service Mobile [phone] Pager [phone] 04/06/2016

## 2016-04-12 ENCOUNTER — Other Ambulatory Visit: Payer: Self-pay

## 2016-04-12 MED ORDER — AMLODIPINE BESYLATE 5 MG PO TABS: 5.0000 mg | ORAL_TABLET | Freq: Every day | ORAL | 0 refills | Status: DC

## 2016-04-12 MED ORDER — LOSARTAN POTASSIUM 100 MG PO TABS: 100.0000 mg | ORAL_TABLET | Freq: Every day | ORAL | 0 refills | Status: DC

## 2016-04-16 ENCOUNTER — Telehealth: Payer: Self-pay | Admitting: *Deleted

## 2016-04-16 NOTE — Telephone Encounter (Signed)
Follow up imaging from last CT lung cancer screening scan is due. Despite multiple attempts at all contact numbers available, and a no show appointment, have not been able to arrange for CT scan. Letter mailed to patient in final attempt to contact patient. I will be happy to assist in the future if patient so desires. Will forward to referring/PCP provider.

## 2016-04-18 ENCOUNTER — Telehealth: Payer: Self-pay | Admitting: Pulmonary Disease

## 2016-04-18 NOTE — Telephone Encounter (Signed)
Pt states that [place] mailed him a letter stating that he didn't send in correct form and he needs to resubmit with provider signature. Informed pt to drop them off and I will give him some samples if needed.

## 2016-04-18 NOTE — Telephone Encounter (Signed)
Patient wants to talk to misty about letter he received from [place] about medication assistance .

## 2016-04-19 ENCOUNTER — Encounter: Payer: Self-pay | Admitting: Internal Medicine

## 2016-04-19 ENCOUNTER — Ambulatory Visit (INDEPENDENT_AMBULATORY_CARE_PROVIDER_SITE_OTHER): Payer: PPO | Admitting: Internal Medicine

## 2016-04-19 VITALS — BP 134/80 | HR 61 | Temp 98.3°F | Ht 71.5 in | Wt 199.4 lb

## 2016-04-19 DIAGNOSIS — G4730 Sleep apnea, unspecified: Secondary | ICD-10-CM | POA: Diagnosis not present

## 2016-04-19 DIAGNOSIS — D649 Anemia, unspecified: Secondary | ICD-10-CM | POA: Diagnosis not present

## 2016-04-19 DIAGNOSIS — G8929 Other chronic pain: Secondary | ICD-10-CM

## 2016-04-19 DIAGNOSIS — I10 Essential (primary) hypertension: Secondary | ICD-10-CM

## 2016-04-19 DIAGNOSIS — M549 Dorsalgia, unspecified: Secondary | ICD-10-CM

## 2016-04-19 DIAGNOSIS — R0602 Shortness of breath: Secondary | ICD-10-CM | POA: Diagnosis not present

## 2016-04-19 DIAGNOSIS — Z23 Encounter for immunization: Secondary | ICD-10-CM | POA: Diagnosis not present

## 2016-04-19 DIAGNOSIS — D472 Monoclonal gammopathy: Secondary | ICD-10-CM

## 2016-04-19 DIAGNOSIS — K219 Gastro-esophageal reflux disease without esophagitis: Secondary | ICD-10-CM

## 2016-04-19 DIAGNOSIS — E871 Hypo-osmolality and hyponatremia: Secondary | ICD-10-CM

## 2016-04-19 DIAGNOSIS — Z87891 Personal history of nicotine dependence: Secondary | ICD-10-CM

## 2016-04-19 DIAGNOSIS — I2510 Atherosclerotic heart disease of native coronary artery without angina pectoris: Secondary | ICD-10-CM

## 2016-04-19 DIAGNOSIS — Z79891 Long term (current) use of opiate analgesic: Secondary | ICD-10-CM | POA: Diagnosis not present

## 2016-04-19 LAB — CBC WITH DIFFERENTIAL/PLATELET
BASOS ABS: 0.1 10*3/uL (ref 0.0–0.1)
Basophils Relative: 0.6 % (ref 0.0–3.0)
EOS ABS: 0.1 10*3/uL (ref 0.0–0.7)
Eosinophils Relative: 0.5 % (ref 0.0–5.0)
HEMATOCRIT: 30.8 % — AB (ref 39.0–52.0)
Hemoglobin: 10.2 g/dL — ABNORMAL LOW (ref 13.0–17.0)
LYMPHS PCT: 25.4 % (ref 12.0–46.0)
Lymphs Abs: 2.6 10*3/uL (ref 0.7–4.0)
MCHC: 33.2 g/dL (ref 30.0–36.0)
MCV: 99.4 fl (ref 78.0–100.0)
Monocytes Absolute: 1.1 10*3/uL — ABNORMAL HIGH (ref 0.1–1.0)
Monocytes Relative: 11.1 % (ref 3.0–12.0)
NEUTROS ABS: 6.3 10*3/uL (ref 1.4–7.7)
NEUTROS PCT: 62.4 % (ref 43.0–77.0)
PLATELETS: 526 10*3/uL — AB (ref 150.0–400.0)
RBC: 3.1 Mil/uL — ABNORMAL LOW (ref 4.22–5.81)
RDW: 15.5 % (ref 11.5–15.5)
WBC: 10 10*3/uL (ref 4.0–10.5)

## 2016-04-19 LAB — IBC PANEL
IRON: 92 ug/dL (ref 42–165)
Saturation Ratios: 8.2 % — ABNORMAL LOW (ref 20.0–50.0)
Transferrin: 806 mg/dL — ABNORMAL HIGH (ref 212.0–360.0)

## 2016-04-19 LAB — VITAMIN B12: VITAMIN B 12: 367 pg/mL (ref 211–911)

## 2016-04-19 LAB — FERRITIN: Ferritin: 13.9 ng/mL — ABNORMAL LOW (ref 22.0–322.0)

## 2016-04-19 MED ORDER — TRAMADOL HCL 50 MG PO TABS: 50.0000 mg | ORAL_TABLET | Freq: Four times a day (QID) | ORAL | 2 refills | Status: DC | PRN

## 2016-04-19 NOTE — Progress Notes (Signed)
Pre visit review using our clinic review tool, if applicable. No additional management support is needed unless otherwise documented below in the visit note. 

## 2016-04-19 NOTE — Progress Notes (Signed)
Patient ID: Troy Lopez, male   DOB: November 13, 1940, 75 y.o.   MRN: [id number]   Subjective:    Patient ID: Troy Lopez, male    DOB: [birth year], 75 y.o.   MRN: [id number]  HPI  Patient here for a scheduled follow up.  He is followed regularly by cardiology.  Just saw Dr Saralyn Pilar.  Has atypical atrial flutter.  Stable.  No changes made.  Also saw Dr Alva Garnet 04/01/16.  Recommended continuing Breo inhaler and albuterol prn.  No chest pain.  Breathing overall stable.  No acid reflux.  He has not smoked for the last 3-4 days.  He quits intermittently.  Will restart.  Decrease overall.  Not taking iron.  hgb last checked decreased.  No blood in his stool.  Bowels stable.  No increased abdominal pain or cramping.  Taking tramadol.    Past Medical History:  Diagnosis Date  . Adrenal gland anomaly    enlargement  . CAD (coronary artery disease)   . Carpal tunnel syndrome   . Colonic polyp   . COPD (chronic obstructive pulmonary disease) ([place])   . Degenerative disc disease, lumbar   . Depression   . Diverticulosis   . GERD (gastroesophageal reflux disease)   . Hypercholesterolemia   . Hyperkalemia   . Hypertension   . Irritable bowel syndrome   . Monoclonal gammopathy   . Personal history of tobacco use, presenting hazards to health 08/17/2015   Past Surgical History:  Procedure Laterality Date  . BUNIONECTOMY  1989  . CARPAL TUNNEL RELEASE Left 2011   ulnar nerve sub muscular at elbow  . CHOLECYSTECTOMY  09/07  . ELECTROPHYSIOLOGIC STUDY N/A 11/15/2015   Procedure: CARDIOVERSION;  Surgeon: Isaias Cowman, MD;  Location: [facility] ORS;  Service: Cardiovascular;  Laterality: N/A;  . EYE SURGERY Bilateral 2010   cataract  . HEMORRHOID SURGERY     Family History  Problem Relation Age of Onset  . Stroke Mother   . Heart disease Father     MI - 81   . Colon cancer Neg Hx   . Prostate cancer Neg Hx    Social History   Social History  . Marital status: Married    Spouse name: N/A   . Number of children: 3  . Years of education: N/A   Social History Main Topics  . Smoking status: Former Smoker    Quit date: 11/01/2015  . Smokeless tobacco: Never Used     Comment: about 2 cigarettes per day, trying to quit  . Alcohol use 25.2 oz/week    42 Cans of beer per week     Comment: occas  . Drug use: No  . Sexual activity: Not Asked   Other Topics Concern  . None   Social History Narrative  . None    Outpatient Encounter Prescriptions as of 04/19/2016  Medication Sig  . acetaminophen (TYLENOL) 325 MG tablet Take 650 mg by mouth every 6 (six) hours as needed for pain.  Marland Kitchen amLODipine (NORVASC) 5 MG tablet Take 1 tablet (5 mg total) by mouth daily.  . budesonide (ENTOCORT EC) 3 MG 24 hr capsule Take by mouth.  . cloNIDine (CATAPRES) 0.1 MG tablet Take by mouth.  . fluticasone (FLONASE) 50 MCG/ACT nasal spray Place 2 sprays into both nostrils daily.  . fluticasone furoate-vilanterol (BREO ELLIPTA) 100-25 MCG/INH AEPB Inhale 1 puff into the lungs daily.  . furosemide (LASIX) 20 MG tablet Take 1 tablet (20 mg total) by mouth daily as  needed.  . gabapentin (NEURONTIN) 100 MG capsule Take two each morning, two each afternoon and three at bedtime  . isosorbide mononitrate (IMDUR) 30 MG 24 hr tablet Take by mouth.  . loperamide (IMODIUM) 2 MG capsule Take 2 mg by mouth daily as needed.  Marland Kitchen losartan (COZAAR) 100 MG tablet Take 1 tablet (100 mg total) by mouth daily.  . metoprolol (LOPRESSOR) 100 MG tablet Take 1 tablet (100 mg total) by mouth 2 (two) times daily.  . mupirocin ointment (BACTROBAN) 2 % Place 1 application into the nose 2 (two) times daily.  . ondansetron (ZOFRAN) 8 MG tablet Take 8 mg by mouth every 8 (eight) hours as needed. Reported on 01/17/2016  . pantoprazole (PROTONIX) 40 MG tablet Take 1 tablet (40 mg total) by mouth 2 (two) times daily.  . traMADol (ULTRAM) 50 MG tablet Take 1 tablet (50 mg total) by mouth every 6 (six) hours as needed. for pain  .  [DISCONTINUED] traMADol (ULTRAM) 50 MG tablet Take 1 tablet (50 mg total) by mouth 2 (two) times daily as needed. for pain   No facility-administered encounter medications on file as of 04/19/2016.     Review of Systems  Constitutional: Positive for fatigue. Negative for appetite change and unexpected weight change.  HENT: Negative for congestion and sinus pressure.   Respiratory: Negative for cough, chest tightness and shortness of breath.   Cardiovascular: Negative for chest pain, palpitations and leg swelling.  Gastrointestinal: Negative for abdominal pain, diarrhea, nausea and vomiting.  Genitourinary: Negative for difficulty urinating and dysuria.  Musculoskeletal: Negative for joint swelling and myalgias.  Skin: Negative for color change and rash.  Neurological: Negative for dizziness, light-headedness and headaches.  Psychiatric/Behavioral: Negative for agitation and dysphoric mood.       Objective:    Physical Exam  Constitutional: He appears well-developed and well-nourished. No distress.  HENT:  Nose: Nose normal.  Mouth/Throat: Oropharynx is clear and moist.  Neck: Neck supple. No thyromegaly present.  Cardiovascular: Normal rate and regular rhythm.   Pulmonary/Chest: Effort normal and breath sounds normal. No respiratory distress.  Abdominal: Soft. Bowel sounds are normal. There is no tenderness.  Musculoskeletal: He exhibits no edema or tenderness.  Lymphadenopathy:    He has no cervical adenopathy.  Skin: No rash noted. No erythema.  Psychiatric: He has a normal mood and affect. His behavior is normal.    BP 134/80   Pulse 61   Temp 98.3 F (36.8 C) (Oral)   Ht 5' 11.5" (1.816 m)   Wt 199 lb 6.4 oz (90.4 kg)   SpO2 93%   BMI 27.42 kg/m  Wt Readings from Last 3 Encounters:  04/19/16 199 lb 6.4 oz (90.4 kg)  04/01/16 195 lb (88.5 kg)  02/15/16 192 lb 6.4 oz (87.3 kg)     Lab Results  Component Value Date   WBC 10.0 04/19/2016   HGB 10.2 (L) 04/19/2016    HCT 30.8 (L) 04/19/2016   PLT 526.0 (H) 04/19/2016   GLUCOSE 97 04/01/2016   CHOL 134 02/14/2016   TRIG 42.0 02/14/2016   HDL 74.80 02/14/2016   LDLCALC 51 02/14/2016   ALT 10 02/14/2016   AST 14 02/14/2016   NA 129 (L) 04/01/2016   K 5.0 04/01/2016   CL 96 (L) 04/01/2016   CREATININE 1.22 04/01/2016   BUN 12 04/01/2016   CO2 24 04/01/2016   TSH 1.75 06/12/2015   PSA 1.33 02/14/2016   HGBA1C 5.7 03/04/2016  Assessment & Plan:   Problem List Items Addressed This Visit    Anemia - Primary    Has had previous GI w/up.  Recent hgb decreased - 10.9.  Recheck cbc, iron studies and B12 today.  May need referral back to GI.       Relevant Orders   CBC with Differential/Platelet (Completed)   Ferritin (Completed)   Vitamin B12 (Completed)   IBC panel (Completed)   CAD (coronary artery disease)    Followed by cardiology.  Just saw Dr Saralyn Pilar.  No changes made.  Follow.       Chronic back pain    Takes tramadol.  Follow.        Relevant Medications   traMADol (ULTRAM) 50 MG tablet   GERD (gastroesophageal reflux disease)    Upper symptoms controlled on protonix.       Hypertension    Blood pressure under good control.  Continue same medication regimen.  Follow pressures.  Follow metabolic panel.        Hyponatremia    Has been worked up by nephrology.  129 on last check.  Stable.  Discussed the need to decrease alcohol intake.        Monoclonal gammopathy    Saw Dr Grayland Ormond.  Recommended f/u SIEP in 6-12 months.        Personal history of tobacco use, presenting hazards to health    Has stopped over the last few days.  Previously had decreased.  Discussed the need to remain off cigarettes.        Sleep apnea    CPAP.       SOB (shortness of breath)    Recently evaluated by cardiology and pulmonary.  Overall stable.         Other Visit Diagnoses    Encounter for immunization       Relevant Medications   traMADol (ULTRAM) 50 MG tablet   Other  Relevant Orders   Flu vaccine HIGH DOSE PF (Completed)       Einar Pheasant, MD

## 2016-04-20 ENCOUNTER — Encounter: Payer: Self-pay | Admitting: Internal Medicine

## 2016-04-20 NOTE — Assessment & Plan Note (Signed)
Recently evaluated by cardiology and pulmonary.  Overall stable.

## 2016-04-20 NOTE — Assessment & Plan Note (Signed)
Takes tramadol.  Follow.

## 2016-04-20 NOTE — Assessment & Plan Note (Signed)
Has had previous GI w/up.  Recent hgb decreased - 10.9.  Recheck cbc, iron studies and B12 today.  May need referral back to GI.

## 2016-04-20 NOTE — Assessment & Plan Note (Signed)
Saw Dr Grayland Ormond.  Recommended f/u SIEP in 6-12 months.

## 2016-04-20 NOTE — Assessment & Plan Note (Signed)
Blood pressure under good control.  Continue same medication regimen.  Follow pressures.  Follow metabolic panel.   

## 2016-04-20 NOTE — Assessment & Plan Note (Signed)
Has stopped over the last few days.  Previously had decreased.  Discussed the need to remain off cigarettes.

## 2016-04-20 NOTE — Assessment & Plan Note (Signed)
Has been worked up by nephrology.  129 on last check.  Stable.  Discussed the need to decrease alcohol intake.

## 2016-04-20 NOTE — Assessment & Plan Note (Signed)
CPAP.  

## 2016-04-20 NOTE — Assessment & Plan Note (Signed)
Followed by cardiology.  Just saw Dr Saralyn Pilar.  No changes made.  Follow.

## 2016-04-20 NOTE — Assessment & Plan Note (Signed)
Upper symptoms controlled on protonix.  

## 2016-04-24 ENCOUNTER — Other Ambulatory Visit: Payer: Self-pay | Admitting: Internal Medicine

## 2016-04-24 DIAGNOSIS — D649 Anemia, unspecified: Secondary | ICD-10-CM | POA: Diagnosis not present

## 2016-04-24 MED ORDER — INTEGRA 62.5-62.5-40-3 MG PO CAPS: 1.0000 | ORAL_CAPSULE | Freq: Every day | ORAL | 2 refills | Status: DC

## 2016-05-01 DIAGNOSIS — D649 Anemia, unspecified: Secondary | ICD-10-CM | POA: Diagnosis not present

## 2016-05-10 ENCOUNTER — Telehealth: Payer: Self-pay

## 2016-05-10 ENCOUNTER — Telehealth: Payer: Self-pay | Admitting: Pulmonary Disease

## 2016-05-10 DIAGNOSIS — K315 Obstruction of duodenum: Secondary | ICD-10-CM | POA: Diagnosis not present

## 2016-05-10 DIAGNOSIS — D649 Anemia, unspecified: Secondary | ICD-10-CM | POA: Diagnosis not present

## 2016-05-10 NOTE — Telephone Encounter (Signed)
Patient dropped off [employer] application.  Placed in [place] box.

## 2016-05-10 NOTE — Telephone Encounter (Signed)
Pt would like a call regarding Breo.

## 2016-05-10 NOTE — Telephone Encounter (Signed)
Spoke with pt who states he received a letter from [place] medication assistants that states he needs to resend his app with a rx. Pt advised to bring [place] a copy of the app and a copy of the letter to help [place] better understand what exactly is needed. Pt states he will bring copies by. Nothing further needed.

## 2016-05-16 ENCOUNTER — Other Ambulatory Visit: Payer: Self-pay | Admitting: Gastroenterology

## 2016-05-16 DIAGNOSIS — K315 Obstruction of duodenum: Secondary | ICD-10-CM

## 2016-05-17 ENCOUNTER — Other Ambulatory Visit: Payer: Self-pay | Admitting: *Deleted

## 2016-05-17 MED ORDER — FLUTICASONE FUROATE-VILANTEROL 100-25 MCG/INH IN AEPB: 1.0000 | INHALATION_SPRAY | Freq: Every day | RESPIRATORY_TRACT | 11 refills | Status: DC

## 2016-05-22 ENCOUNTER — Ambulatory Visit
Admission: RE | Admit: 2016-05-22 | Discharge: 2016-05-22 | Disposition: A | Discharge status: Home / Self Care | Payer: PPO | Source: Ambulatory Visit | Attending: Gastroenterology | Admitting: Gastroenterology

## 2016-05-22 DIAGNOSIS — R140 Abdominal distension (gaseous): Secondary | ICD-10-CM | POA: Diagnosis not present

## 2016-05-22 DIAGNOSIS — I700 Atherosclerosis of aorta: Secondary | ICD-10-CM | POA: Insufficient documentation

## 2016-05-22 DIAGNOSIS — K315 Obstruction of duodenum: Secondary | ICD-10-CM | POA: Insufficient documentation

## 2016-05-23 ENCOUNTER — Other Ambulatory Visit: Payer: Self-pay | Admitting: Internal Medicine

## 2016-06-03 NOTE — Progress Notes (Signed)
[facility]  [phone] [phone]  ID: Troy Lopez OB: 26-Mar-1941  MR#: [id number]  STM#:[id number]  Patient Care Team: Einar Pheasant, MD as PCP - General (Internal Medicine)  CHIEF COMPLAINT: MGUS, iron deficiency anemia  INTERVAL HISTORY: Patient last evaluated in clinic in December 2016. He is referred back for declining hemoglobin. He continues to feel well and remains asymptomatic. He does not complain of any weakness or fatigue. He has no neurologic complaints. He denies any fevers or recent illnesses. He has no pain. He has good appetite and denies weight loss. He has no chest pain or shortness of breath. He denies any nausea, vomiting, constipation, or diarrhea. He has no urinary complaints. Patient feels at his baseline and offers no specific complaints today.  REVIEW OF SYSTEMS:   Review of Systems  Constitutional: Negative.  Negative for fever, malaise/fatigue and weight loss.  Respiratory: Negative.  Negative for cough and shortness of breath.   Cardiovascular: Negative.  Negative for chest pain and leg swelling.  Gastrointestinal: Negative.  Negative for abdominal pain.  Musculoskeletal: Negative.   Neurological: Negative.  Negative for weakness.  Psychiatric/Behavioral: Negative.  The patient is not nervous/anxious.     As per HPI. Otherwise, a complete review of systems is negative.  PAST MEDICAL HISTORY: Past Medical History:  Diagnosis Date  . Adrenal gland anomaly    enlargement  . CAD (coronary artery disease)   . Carpal tunnel syndrome   . Colonic polyp   . COPD (chronic obstructive pulmonary disease) ([place])   . Degenerative disc disease, lumbar   . Depression   . Diverticulosis   . GERD (gastroesophageal reflux disease)   . Hypercholesterolemia   . Hyperkalemia   . Hypertension   . Irritable bowel syndrome   . Monoclonal gammopathy   . Personal history of tobacco use, presenting hazards to health 08/17/2015      PAST SURGICAL HISTORY: Past Surgical History:  Procedure Laterality Date  . BUNIONECTOMY  1989  . CARPAL TUNNEL RELEASE Left 2011   ulnar nerve sub muscular at elbow  . CHOLECYSTECTOMY  09/07  . ELECTROPHYSIOLOGIC STUDY N/A 11/15/2015   Procedure: CARDIOVERSION;  Surgeon: Isaias Cowman, MD;  Location: [facility] ORS;  Service: Cardiovascular;  Laterality: N/A;  . EYE SURGERY Bilateral 2010   cataract  . HEMORRHOID SURGERY      FAMILY HISTORY Family History  Problem Relation Age of Onset  . Stroke Mother   . Heart disease Father     MI - 71   . Colon cancer Neg Hx   . Prostate cancer Neg Hx        ADVANCED DIRECTIVES:    HEALTH MAINTENANCE: Social History  Substance Use Topics  . Smoking status: Former Smoker    Quit date: 11/01/2015  . Smokeless tobacco: Never Used     Comment: about 2 cigarettes per day, trying to quit  . Alcohol use 25.2 oz/week    42 Cans of beer per week     Comment: occas     Colonoscopy:  PAP:  Bone density:  Lipid panel:  Allergies  Allergen Reactions  . 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 acetaminophen (TYLENOL) 325 MG tablet Take 650 mg by mouth every 6 (six) hours as needed for pain.    Marland Kitchen amLODipine (NORVASC) 5 MG tablet Take 1 tablet (5 mg total)  by mouth daily. 90 tablet 0  . budesonide (ENTOCORT EC) 3 MG 24 hr capsule Take by mouth.    . cloNIDine (CATAPRES) 0.1 MG tablet Take by mouth.    Arne Cleveland 5 MG TABS tablet Take 1 tablet by mouth 2 (two) times daily.    . fluticasone (FLONASE) 50 MCG/ACT nasal spray Place 2 sprays into both nostrils daily. 16 g 5  . fluticasone furoate-vilanterol (BREO ELLIPTA) 100-25 MCG/INH AEPB Inhale 1 puff into the lungs daily. 60 each 11  . furosemide (LASIX) 20 MG tablet Take 1 tablet (20 mg total) by mouth daily as needed. 30 tablet 6  . gabapentin  (NEURONTIN) 100 MG capsule Take two each morning, two each afternoon and three at bedtime 210 capsule 0  . isosorbide mononitrate (IMDUR) 30 MG 24 hr tablet Take by mouth.    . loperamide (IMODIUM) 2 MG capsule Take 2 mg by mouth daily as needed.    Marland Kitchen losartan (COZAAR) 100 MG tablet Take 1 tablet (100 mg total) by mouth daily. 90 tablet 0  . metoprolol (LOPRESSOR) 100 MG tablet Take 1 tablet (100 mg total) by mouth 2 (two) times daily. 60 tablet 5  . mupirocin ointment (BACTROBAN) 2 % Place 1 application into the nose 2 (two) times daily. 22 g 0  . ondansetron (ZOFRAN) 8 MG tablet Take 8 mg by mouth every 8 (eight) hours as needed. Reported on 01/17/2016    . pantoprazole (PROTONIX) 40 MG tablet Take 1 tablet (40 mg total) by mouth 2 (two) times daily. 180 tablet 1  . traMADol (ULTRAM) 50 MG tablet Take 1 tablet (50 mg total) by mouth every 6 (six) hours as needed. for pain 120 tablet 2   No current facility-administered medications for this visit.     OBJECTIVE: Vitals:   06/04/16 1503  BP: (!) 149/61  Pulse: 73  Resp: 18  Temp: 97.7 F (36.5 C)     Body mass index is 27.55 kg/m.    ECOG FS:0 - Asymptomatic  General: Well-developed, well-nourished, no acute distress. Eyes: Pink conjunctiva, anicteric sclera. Lungs: Clear to auscultation bilaterally. Heart: Regular rate and rhythm. No rubs, murmurs, or gallops. Abdomen: Soft, nontender, nondistended. No organomegaly noted, normoactive bowel sounds. Musculoskeletal: No edema, cyanosis, or clubbing. Neuro: Alert, answering all questions appropriately. Cranial nerves grossly intact. Skin: No rashes or petechiae noted. Psych: Normal affect.   LAB RESULTS:  Lab Results  Component Value Date   NA 129 (L) 04/01/2016   K 5.0 04/01/2016   CL 96 (L) 04/01/2016   CO2 24 04/01/2016   GLUCOSE 97 04/01/2016   BUN 12 04/01/2016   CREATININE 1.22 04/01/2016   CALCIUM 9.5 04/01/2016   PROT 8.5 (H) 02/14/2016   ALBUMIN 3.4 (L) 02/14/2016    AST 14 02/14/2016   ALT 10 02/14/2016   ALKPHOS 79 02/14/2016   BILITOT 0.7 02/14/2016   GFRNONAA 57 (L) 04/01/2016   GFRAA >60 04/01/2016    Lab Results  Component Value Date   WBC 10.0 04/19/2016   NEUTROABS 6.3 04/19/2016   HGB 10.2 (L) 04/19/2016   HCT 30.8 (L) 04/19/2016   MCV 99.4 04/19/2016   PLT 526.0 (H) 04/19/2016   Lab Results  Component Value Date   IRON 92 04/19/2016   TIBC 1,297 (H) 06/07/2014   IRONPCTSAT 8.2 (L) 04/19/2016    Lab Results  Component Value Date   FERRITIN 13.9 (L) 04/19/2016    STUDIES: Dg Ugi W/small Bowel  Result Date: 05/22/2016  CLINICAL DATA:  Unexplained anemia, bloating, intermittent heme-positive stool, history of a duodenal stricture EXAM: UPPER GI SERIES WITH SMALL BOWEL FOLLOW-THROUGH with barium FLUOROSCOPY TIME:  Fluoroscopy Time:  1 minutes, 42 seconds Radiation Exposure Index (if provided by the fluoroscopic device): 1924.98 uGy m2 Number of Acquired Spot Images: 13 TECHNIQUE: Combined double contrast and single contrast upper GI series using effervescent crystals, thick barium, and thin barium. Subsequently, serial images of the small bowel were obtained including spot views of the terminal ileum. COMPARISON:  Abdominal and pelvic CT scan of April 30, 2013 FINDINGS: The scout film reveals an unremarkable bowel gas pattern. There surgical clips in the gallbladder fossa. There is multilevel degenerative disc disease of the lumbar spine. There is calcification in the wall of the abdominal aorta and iliac arteries. The patient ingested thick and thin barium and the gas-forming crystals without difficulty. The esophagus was normal in a survey fashion. There is no hiatal hernia nor gastroesophageal reflux. The stomach distended well. No ulcer niche was observed. The mucosal fold pattern was normal. Gastric emptying was prompt. The duodenal bulb and C-loop were normal in appearance. There was no evidence of duodenal obstruction. Over the  course of approximately 30 minutes the barium passed through the jejunum and ileum and reached the right colon. No abnormally narrowed or dilated small bowel loops were observed. The terminal ileum was normal. No abnormal separation of the small bowel loops was observed. There was no tenderness to palpation. IMPRESSION: 1. Normal appearance of the stomach and duodenum. 2. Normal appearance of the jejunum and ileum including the terminal ileum. Moderately rapid transit of the bowel from the duodenum to the right colon which has been previously reported. 3. Abdominal aortic atherosclerosis. Electronically Signed   By: David  Martinique M.D.   On: 05/22/2016 09:54    ASSESSMENT: MGUS, iron deficiency anemia.  PLAN:    1. MGUS:  Patient's most recent M spike from 2016 trended up slightly from approximately 1.7 to 2.3. He now has iron deficiency anemia, but no other evidence of endorgan damage. Previously, full workup was negative.  It is possible patient has smoldering myeloma, but this would require a bone marrow biopsy to confirm which patient does not wish to pursue at this time.  2. Iron deficiency anemia: Patient's hemoglobin and iron stores have trended down. He reports in a GI workup. Return to clinic later this week for 510 mg of IV Feraheme. Return to clinic in 2 weeks for her second infusion. Patient will then return to clinic in 3 months with repeat laboratory work and further evaluation.  Patient expressed understanding and was in agreement with this plan. He also understands that He can call clinic at any time with any questions, concerns, or complaints.    Lloyd Huger, MD   06/07/2016 4:04 PM

## 2016-06-04 ENCOUNTER — Inpatient Hospital Stay: Payer: PPO | Attending: Oncology | Admitting: Oncology

## 2016-06-04 VITALS — BP 149/61 | HR 73 | Temp 97.7°F | Resp 18 | Wt 200.3 lb

## 2016-06-04 DIAGNOSIS — D472 Monoclonal gammopathy: Secondary | ICD-10-CM | POA: Insufficient documentation

## 2016-06-04 DIAGNOSIS — E279 Disorder of adrenal gland, unspecified: Secondary | ICD-10-CM | POA: Insufficient documentation

## 2016-06-04 DIAGNOSIS — D509 Iron deficiency anemia, unspecified: Secondary | ICD-10-CM | POA: Insufficient documentation

## 2016-06-04 DIAGNOSIS — F17210 Nicotine dependence, cigarettes, uncomplicated: Secondary | ICD-10-CM | POA: Diagnosis not present

## 2016-06-04 DIAGNOSIS — K219 Gastro-esophageal reflux disease without esophagitis: Secondary | ICD-10-CM | POA: Diagnosis not present

## 2016-06-04 DIAGNOSIS — Z86010 Personal history of colonic polyps: Secondary | ICD-10-CM | POA: Insufficient documentation

## 2016-06-04 DIAGNOSIS — M5136 Other intervertebral disc degeneration, lumbar region: Secondary | ICD-10-CM | POA: Insufficient documentation

## 2016-06-04 DIAGNOSIS — I10 Essential (primary) hypertension: Secondary | ICD-10-CM | POA: Diagnosis not present

## 2016-06-04 DIAGNOSIS — I700 Atherosclerosis of aorta: Secondary | ICD-10-CM | POA: Insufficient documentation

## 2016-06-04 DIAGNOSIS — J449 Chronic obstructive pulmonary disease, unspecified: Secondary | ICD-10-CM | POA: Insufficient documentation

## 2016-06-04 DIAGNOSIS — E7800 Pure hypercholesterolemia, unspecified: Secondary | ICD-10-CM | POA: Diagnosis not present

## 2016-06-04 DIAGNOSIS — K589 Irritable bowel syndrome without diarrhea: Secondary | ICD-10-CM | POA: Diagnosis not present

## 2016-06-04 DIAGNOSIS — Z79899 Other long term (current) drug therapy: Secondary | ICD-10-CM | POA: Diagnosis not present

## 2016-06-04 DIAGNOSIS — Z7901 Long term (current) use of anticoagulants: Secondary | ICD-10-CM | POA: Diagnosis not present

## 2016-06-04 DIAGNOSIS — Z87891 Personal history of nicotine dependence: Secondary | ICD-10-CM | POA: Diagnosis not present

## 2016-06-04 DIAGNOSIS — F329 Major depressive disorder, single episode, unspecified: Secondary | ICD-10-CM | POA: Diagnosis not present

## 2016-06-04 DIAGNOSIS — I2510 Atherosclerotic heart disease of native coronary artery without angina pectoris: Secondary | ICD-10-CM | POA: Insufficient documentation

## 2016-06-04 NOTE — Progress Notes (Signed)
Pt is here due to low blood count that was checked at [facility]. Has been feeling tired for a few months.

## 2016-06-05 ENCOUNTER — Inpatient Hospital Stay: Payer: PPO

## 2016-06-05 VITALS — BP 126/83 | HR 56 | Temp 97.8°F | Resp 18

## 2016-06-05 DIAGNOSIS — D509 Iron deficiency anemia, unspecified: Secondary | ICD-10-CM

## 2016-06-05 DIAGNOSIS — D472 Monoclonal gammopathy: Secondary | ICD-10-CM | POA: Diagnosis not present

## 2016-06-05 MED ORDER — SODIUM CHLORIDE 0.9 % IV SOLN
Freq: Once | INTRAVENOUS | Status: AC
  Administered 2016-06-05: 14:00:00 via INTRAVENOUS
  Filled 2016-06-05: qty 1000

## 2016-06-05 MED ORDER — SODIUM CHLORIDE 0.9 % IV SOLN
510.0000 mg | Freq: Once | INTRAVENOUS | Status: AC
  Administered 2016-06-05: 510 mg via INTRAVENOUS
  Filled 2016-06-05: qty 17

## 2016-06-12 ENCOUNTER — Inpatient Hospital Stay: Payer: PPO

## 2016-06-12 VITALS — BP 123/80 | HR 56 | Temp 97.3°F | Resp 18

## 2016-06-12 DIAGNOSIS — D472 Monoclonal gammopathy: Secondary | ICD-10-CM | POA: Diagnosis not present

## 2016-06-12 DIAGNOSIS — D509 Iron deficiency anemia, unspecified: Secondary | ICD-10-CM

## 2016-06-12 MED ORDER — SODIUM CHLORIDE 0.9 % IV SOLN
Freq: Once | INTRAVENOUS | Status: AC
  Administered 2016-06-12: 14:00:00 via INTRAVENOUS
  Filled 2016-06-12: qty 1000

## 2016-06-12 MED ORDER — SODIUM CHLORIDE 0.9 % IV SOLN
510.0000 mg | Freq: Once | INTRAVENOUS | Status: AC
  Administered 2016-06-12: 510 mg via INTRAVENOUS
  Filled 2016-06-12: qty 17

## 2016-06-17 DIAGNOSIS — Z85828 Personal history of other malignant neoplasm of skin: Secondary | ICD-10-CM | POA: Diagnosis not present

## 2016-06-17 DIAGNOSIS — Z08 Encounter for follow-up examination after completed treatment for malignant neoplasm: Secondary | ICD-10-CM | POA: Diagnosis not present

## 2016-06-17 DIAGNOSIS — L249 Irritant contact dermatitis, unspecified cause: Secondary | ICD-10-CM | POA: Diagnosis not present

## 2016-06-17 DIAGNOSIS — Z1283 Encounter for screening for malignant neoplasm of skin: Secondary | ICD-10-CM | POA: Diagnosis not present

## 2016-06-17 DIAGNOSIS — D485 Neoplasm of uncertain behavior of skin: Secondary | ICD-10-CM | POA: Diagnosis not present

## 2016-06-17 DIAGNOSIS — L570 Actinic keratosis: Secondary | ICD-10-CM | POA: Diagnosis not present

## 2016-06-19 ENCOUNTER — Other Ambulatory Visit: Payer: Self-pay | Admitting: Internal Medicine

## 2016-06-20 DIAGNOSIS — I10 Essential (primary) hypertension: Secondary | ICD-10-CM | POA: Diagnosis not present

## 2016-06-20 DIAGNOSIS — Z720 Tobacco use: Secondary | ICD-10-CM | POA: Diagnosis not present

## 2016-06-20 DIAGNOSIS — Z9889 Other specified postprocedural states: Secondary | ICD-10-CM | POA: Diagnosis not present

## 2016-06-20 DIAGNOSIS — R0789 Other chest pain: Secondary | ICD-10-CM | POA: Diagnosis not present

## 2016-06-20 DIAGNOSIS — J410 Simple chronic bronchitis: Secondary | ICD-10-CM | POA: Diagnosis not present

## 2016-06-20 DIAGNOSIS — G4733 Obstructive sleep apnea (adult) (pediatric): Secondary | ICD-10-CM | POA: Diagnosis not present

## 2016-06-20 DIAGNOSIS — E7800 Pure hypercholesterolemia, unspecified: Secondary | ICD-10-CM | POA: Diagnosis not present

## 2016-06-26 DIAGNOSIS — D2261 Melanocytic nevi of right upper limb, including shoulder: Secondary | ICD-10-CM | POA: Diagnosis not present

## 2016-06-26 DIAGNOSIS — D485 Neoplasm of uncertain behavior of skin: Secondary | ICD-10-CM | POA: Diagnosis not present

## 2016-07-09 ENCOUNTER — Ambulatory Visit: Payer: Self-pay | Admitting: Internal Medicine

## 2016-07-15 ENCOUNTER — Ambulatory Visit (INDEPENDENT_AMBULATORY_CARE_PROVIDER_SITE_OTHER): Payer: PPO | Admitting: Pulmonary Disease

## 2016-07-15 ENCOUNTER — Encounter: Payer: Self-pay | Admitting: Pulmonary Disease

## 2016-07-15 ENCOUNTER — Other Ambulatory Visit: Payer: Self-pay | Admitting: Internal Medicine

## 2016-07-15 ENCOUNTER — Ambulatory Visit
Admission: RE | Admit: 2016-07-15 | Discharge: 2016-07-15 | Disposition: A | Discharge status: Home / Self Care | Payer: PPO | Source: Ambulatory Visit | Attending: Pulmonary Disease | Admitting: Pulmonary Disease

## 2016-07-15 VITALS — BP 124/66 | HR 68 | Ht 71.0 in | Wt 201.4 lb

## 2016-07-15 DIAGNOSIS — F17200 Nicotine dependence, unspecified, uncomplicated: Secondary | ICD-10-CM

## 2016-07-15 DIAGNOSIS — J449 Chronic obstructive pulmonary disease, unspecified: Secondary | ICD-10-CM | POA: Diagnosis not present

## 2016-07-15 DIAGNOSIS — J984 Other disorders of lung: Secondary | ICD-10-CM | POA: Diagnosis not present

## 2016-07-15 DIAGNOSIS — I700 Atherosclerosis of aorta: Secondary | ICD-10-CM | POA: Diagnosis not present

## 2016-07-15 DIAGNOSIS — R918 Other nonspecific abnormal finding of lung field: Secondary | ICD-10-CM | POA: Insufficient documentation

## 2016-07-15 MED ORDER — UMECLIDINIUM-VILANTEROL 62.5-25 MCG/INH IN AEPB: 1.0000 | INHALATION_SPRAY | Freq: Every day | RESPIRATORY_TRACT | 0 refills | Status: AC

## 2016-07-15 NOTE — Patient Instructions (Addendum)
Stop Breo for now Trial of Anoro inhaler - 2 samples provided - one week on , one week off, one week on, etc If Anoro is beneficial, call and we will fill a prescription for it Continue albuterol as needed Continue efforts at smoking cessation/abstinence  Follow up in 3-4 months or sooner as needed

## 2016-07-15 NOTE — Progress Notes (Signed)
PULMONARY OFFICE FOLLOW UP NOTE  Requesting MD/Service: Einar Pheasant, MD Date of initial consultation: 09/18/15 Reason for consultation: Dyspnea  PT PROFILE: 75 y.o. M smoker underwent initial evaluation for progressive dyspnea on 09/18/15 with finding of cardiac arrhythmia - probable atrial flutter with variable conduction and frequent PVCs. Initial impression: Likely COPD. Acute worsening of respiratory symptoms possibly due to cardiac arrhythmia. Case discussed with Dr Saralyn Pilar. Trial of Incruse initiated  NM stress 08/17/15: 1. Equivocal ETT. 2. Normal left ventricular function. 3. Normal wall motion. 4. No evidence for scar or ischemia  TTE 08/02/15: NORMAL LEFT VENTRICULAR SYSTOLIC FUNCTION (LVEF [id number]) WITH MODERATE LVH. MODERATE MITRAL REGURGITATION. MODERATE PHTN (PASP est 57 mmHg). LA moderately dilated  09/22/15 PFTs: mild-mod obstruction. Normal TLC. Severe reduction DLCO  10/09/15 CXR: hyperinflation without acute findings  10/09/15 ambulatory oximetry: no desaturation.   11/15/15: Successful DCCV ([facility])  SUBJ: Dyspnea unchanged. Has not smoked in approx 3-4 weeks. Not using Breo every day - only on days when he knows he's going to be active. Using albuterol MDI 1-2 times daily. No new complaints. Denies CP, fever, purulent sputum, hemoptysis, LE edema and calf tenderness   Vitals:   07/15/16 0921  BP: 124/66  Pulse: 68  SpO2: 96%  Weight: 201 lb 6.4 oz (91.4 kg)  Height: 5\' 11"  (1.803 m)     EXAM:  Gen: No distress HEENT: NCAT, oropharynx normal Neck: Supple without LAN, thyromegaly, JVD Lungs: breath sounds mildly diminished, no wheezes Cardiovascular: RRR, no murmurs noted Abdomen: Soft, nontender, normal BS Ext: without clubbing, cyanosis, edema Neuro: grossly intact  DATA: CXR 09/15/15: NACPD. Chronic opacity in LLL  IMPRESSION:   1) Smoker - abstinent X 3-4 weeks 2) COPD, predominantly emphysema - mild-mod obstruction  3) Irregular LLL lung  opacity - this has been present on various imaging studies for several years and does not have the appearance of malignancy  PLAN:  Continue Breo inhaler and PRN albuterol Continue efforts at smoking abstinence Follow up in 4 months or PRN.   Merton Border, MD PCCM service Mobile [phone] Pager [phone] 07/15/2016

## 2016-07-17 ENCOUNTER — Ambulatory Visit (INDEPENDENT_AMBULATORY_CARE_PROVIDER_SITE_OTHER): Payer: PPO | Admitting: Internal Medicine

## 2016-07-17 ENCOUNTER — Encounter: Payer: Self-pay | Admitting: Internal Medicine

## 2016-07-17 DIAGNOSIS — G4730 Sleep apnea, unspecified: Secondary | ICD-10-CM | POA: Diagnosis not present

## 2016-07-17 DIAGNOSIS — G8929 Other chronic pain: Secondary | ICD-10-CM

## 2016-07-17 DIAGNOSIS — K52831 Collagenous colitis: Secondary | ICD-10-CM

## 2016-07-17 DIAGNOSIS — E871 Hypo-osmolality and hyponatremia: Secondary | ICD-10-CM | POA: Diagnosis not present

## 2016-07-17 DIAGNOSIS — M549 Dorsalgia, unspecified: Secondary | ICD-10-CM

## 2016-07-17 DIAGNOSIS — D472 Monoclonal gammopathy: Secondary | ICD-10-CM | POA: Diagnosis not present

## 2016-07-17 DIAGNOSIS — D509 Iron deficiency anemia, unspecified: Secondary | ICD-10-CM

## 2016-07-17 DIAGNOSIS — F329 Major depressive disorder, single episode, unspecified: Secondary | ICD-10-CM

## 2016-07-17 DIAGNOSIS — I10 Essential (primary) hypertension: Secondary | ICD-10-CM | POA: Diagnosis not present

## 2016-07-17 DIAGNOSIS — K219 Gastro-esophageal reflux disease without esophagitis: Secondary | ICD-10-CM

## 2016-07-17 DIAGNOSIS — F32A Depression, unspecified: Secondary | ICD-10-CM

## 2016-07-17 DIAGNOSIS — I2510 Atherosclerotic heart disease of native coronary artery without angina pectoris: Secondary | ICD-10-CM

## 2016-07-17 MED ORDER — SERTRALINE HCL 50 MG PO TABS
50.0000 mg | ORAL_TABLET | Freq: Every day | ORAL | 1 refills | Status: DC
Start: 2016-07-17 — End: 2016-09-18

## 2016-07-17 NOTE — Progress Notes (Signed)
Patient ID: Troy Lopez, male   DOB: 03/24/1941, 75 y.o.   MRN: [id number]   Subjective:    Patient ID: Troy Lopez, male    DOB: [birth year], 75 y.o.   MRN: [id number]  HPI  Patient here for a scheduled follow up.  He has known CAD.  Saw cardiology recently.  Had sestamibi study that revealed no ischemia.  Also has paroxysmal afib s/p cardioversion.  On eliquis.  Last checked 06/20/16.  They felt things were stable.  Recommended f/u in 6 months.  He also saw Dr Alva Garnet 07/15/16.  Was instructed to continue Breo and prn albuterol.  Recommended f/u in 4 months.  He was recently found to be more anemic.  S/p iron infusion.  Feels better.  Seeing Dr Grayland Ormond.  Some fatigue.  Eating.  No nausea or vomiting.  Bowels doing better.  Some depression.  Discussed with him today.  Has cut down on alcohol intake.  Discussed treatment for depression.  Discussed decreasing alcohol intake more.  Does not feel tramadol is helping.  Will stop to start something for depression.     Past Medical History:  Diagnosis Date  . Adrenal gland anomaly    enlargement  . CAD (coronary artery disease)   . Carpal tunnel syndrome   . Colonic polyp   . COPD (chronic obstructive pulmonary disease) ([place])   . Degenerative disc disease, lumbar   . Depression   . Diverticulosis   . GERD (gastroesophageal reflux disease)   . Hypercholesterolemia   . Hyperkalemia   . Hypertension   . Irritable bowel syndrome   . Monoclonal gammopathy   . Personal history of tobacco use, presenting hazards to health 08/17/2015   Past Surgical History:  Procedure Laterality Date  . BUNIONECTOMY  1989  . CARPAL TUNNEL RELEASE Left 2011   ulnar nerve sub muscular at elbow  . CHOLECYSTECTOMY  09/07  . ELECTROPHYSIOLOGIC STUDY N/A 11/15/2015   Procedure: CARDIOVERSION;  Surgeon: Isaias Cowman, MD;  Location: [facility] ORS;  Service: Cardiovascular;  Laterality: N/A;  . EYE SURGERY Bilateral 2010   cataract  . HEMORRHOID SURGERY      Family History  Problem Relation Age of Onset  . Stroke Mother   . Heart disease Father     MI - 59   . Colon cancer Neg Hx   . Prostate cancer Neg Hx    Social History   Social History  . Marital status: Married    Spouse name: N/A  . Number of children: 3  . Years of education: N/A   Social History Main Topics  . Smoking status: Former Smoker    Quit date: 11/01/2015  . Smokeless tobacco: Never Used     Comment: about 2 cigarettes per day, trying to quit  . Alcohol use 25.2 oz/week    42 Cans of beer per week     Comment: occas  . Drug use: No  . Sexual activity: Not Asked   Other Topics Concern  . None   Social History Narrative  . None    Outpatient Encounter Prescriptions as of 07/17/2016  Medication Sig  . acetaminophen (TYLENOL) 325 MG tablet Take 650 mg by mouth every 6 (six) hours as needed for pain.  Marland Kitchen amLODipine (NORVASC) 5 MG tablet Take 1 tablet (5 mg total) by mouth daily.  . budesonide (ENTOCORT EC) 3 MG 24 hr capsule Take by mouth as needed.   . [EXPIRED] cloNIDine (CATAPRES) 0.1 MG tablet Take by mouth.  Marland Kitchen  ELIQUIS 5 MG TABS tablet Take 1 tablet by mouth 2 (two) times daily.  . fluticasone (FLONASE) 50 MCG/ACT nasal spray Place 2 sprays into both nostrils daily.  . fluticasone furoate-vilanterol (BREO ELLIPTA) 100-25 MCG/INH AEPB Inhale 1 puff into the lungs daily.  . furosemide (LASIX) 20 MG tablet Take 1 tablet (20 mg total) by mouth daily as needed.  . gabapentin (NEURONTIN) 100 MG capsule Take two each morning, two each afternoon and three at bedtime  . hydrocortisone 2.5 % cream   . isosorbide mononitrate (IMDUR) 30 MG 24 hr tablet Take by mouth.  . loperamide (IMODIUM) 2 MG capsule Take 2 mg by mouth daily as needed.  Marland Kitchen losartan (COZAAR) 100 MG tablet Take 1 tablet (100 mg total) by mouth daily.  . metoprolol (LOPRESSOR) 100 MG tablet Take 1 tablet (100 mg total) by mouth 2 (two) times daily.  . mupirocin ointment (BACTROBAN) 2 % Place 1  application into the nose 2 (two) times daily.  . pantoprazole (PROTONIX) 40 MG tablet Take 1 tablet (40 mg total) by mouth 2 (two) times daily.  . traMADol (ULTRAM) 50 MG tablet Take 1 tablet (50 mg total) by mouth every 6 (six) hours as needed. for pain  . sertraline (ZOLOFT) 50 MG tablet Take 1 tablet (50 mg total) by mouth daily.   No facility-administered encounter medications on file as of 07/17/2016.     Review of Systems  Constitutional: Negative for appetite change and unexpected weight change.  HENT: Negative for congestion and sinus pressure.   Respiratory: Negative for cough and chest tightness.        Breathing stable.    Cardiovascular: Negative for chest pain, palpitations and leg swelling.  Gastrointestinal: Negative for abdominal pain, diarrhea, nausea and vomiting.  Genitourinary: Negative for difficulty urinating and dysuria.  Musculoskeletal:       Back and leg pain as outlined.    Skin: Negative for color change and rash.  Neurological: Negative for dizziness and headaches.  Psychiatric/Behavioral: Negative for agitation.       Some depression.  Discussed treatment.         Objective:     Blood pressure rechecked by me:  132/74  Physical Exam  Constitutional: He appears well-developed and well-nourished. No distress.  HENT:  Nose: Nose normal.  Mouth/Throat: Oropharynx is clear and moist.  Neck: Neck supple. No thyromegaly present.  Cardiovascular: Normal rate and regular rhythm.   Pulmonary/Chest: Effort normal and breath sounds normal. No respiratory distress.  Abdominal: Soft. Bowel sounds are normal. There is no tenderness.  Musculoskeletal: He exhibits no edema or tenderness.  Lymphadenopathy:    He has no cervical adenopathy.  Skin: No rash noted. No erythema.  Psychiatric: He has a normal mood and affect. His behavior is normal.    BP 104/60   Pulse 67   Temp 98.2 F (36.8 C) (Oral)   Ht 6' (1.829 m)   Wt 200 lb 6.4 oz (90.9 kg)   SpO2  92%   BMI 27.18 kg/m  Wt Readings from Last 3 Encounters:  07/17/16 200 lb 6.4 oz (90.9 kg)  07/15/16 201 lb 6.4 oz (91.4 kg)  06/04/16 200 lb 4.6 oz (90.9 kg)     Lab Results  Component Value Date   WBC 10.0 04/19/2016   HGB 10.2 (L) 04/19/2016   HCT 30.8 (L) 04/19/2016   PLT 526.0 (H) 04/19/2016   GLUCOSE 97 04/01/2016   CHOL 134 02/14/2016   TRIG 42.0 02/14/2016  HDL 74.80 02/14/2016   LDLCALC 51 02/14/2016   ALT 10 02/14/2016   AST 14 02/14/2016   NA 129 (L) 04/01/2016   K 5.0 04/01/2016   CL 96 (L) 04/01/2016   CREATININE 1.22 04/01/2016   BUN 12 04/01/2016   CO2 24 04/01/2016   TSH 1.75 06/12/2015   PSA 1.33 02/14/2016   HGBA1C 5.7 03/04/2016    Dg Chest 2 View  Result Date: 07/15/2016 CLINICAL DATA:  COPD, former smoker, history of hypertension and coronary artery disease. EXAM: CHEST  2 VIEW COMPARISON:  PA and lateral chest x-ray of March 13, 2016 FINDINGS: The lungs are mildly hyperinflated. The interstitial markings are coarse. There is increased density overlying the left nipple shadow. The heart and pulmonary vascularity are normal. There is calcification in the wall of the thoracic aorta. The mediastinum is normal in width. The bony thorax exhibits no acute abnormality. IMPRESSION: Subtle increased density lateral to the cardiac apex more conspicuous than in the past compatible with atelectasis or early lingular infiltrate. Underlying chronic bronchitic changes, stable. Thoracic aortic atherosclerosis. Electronically Signed   By: David  Martinique M.D.   On: 07/15/2016 09:01       Assessment & Plan:   Problem List Items Addressed This Visit    Anemia    Receiving IV infusion.  Followed by hematology.        CAD (coronary artery disease)    Followed by cardiology.  Just saw Dr Saralyn Pilar.  Had stress test as outlined.  Stable.        Chronic back pain    Takes tramadol.  Does not feel helps.  Will taper off.        Collagenous colitis    Bowels better   Continue f/u with GI.        Depression    Discussed with him today.  Start zoloft as directed.  Follow.  Will come off tramadol.        Relevant Medications   sertraline (ZOLOFT) 50 MG tablet   GERD (gastroesophageal reflux disease)    Controlled on protonix.  Follow.        Hypertension    Blood pressure under good control.  Continue same medication regimen.  Follow pressures.  Follow metabolic panel.         Hyponatremia    Has been worked up by nephrology.  Stable sodium.  Decrease alcohol intake.        MGUS (monoclonal gammopathy of unknown significance)    Followed by hematology.        Sleep apnea    CPAP.           Einar Pheasant, MD

## 2016-07-17 NOTE — Progress Notes (Signed)
Pre visit review using our clinic review tool, if applicable. No additional management support is needed unless otherwise documented below in the visit note. 

## 2016-07-21 ENCOUNTER — Encounter: Payer: Self-pay | Admitting: Internal Medicine

## 2016-07-21 DIAGNOSIS — F329 Major depressive disorder, single episode, unspecified: Secondary | ICD-10-CM

## 2016-07-21 DIAGNOSIS — F320 Major depressive disorder, single episode, mild: Secondary | ICD-10-CM | POA: Insufficient documentation

## 2016-07-21 DIAGNOSIS — F32A Depression, unspecified: Secondary | ICD-10-CM | POA: Insufficient documentation

## 2016-07-21 NOTE — Assessment & Plan Note (Signed)
Receiving IV infusion.  Followed by hematology.

## 2016-07-21 NOTE — Assessment & Plan Note (Signed)
Blood pressure under good control.  Continue same medication regimen.  Follow pressures.  Follow metabolic panel.   

## 2016-07-21 NOTE — Assessment & Plan Note (Signed)
Controlled on protonix.  Follow.   

## 2016-07-21 NOTE — Assessment & Plan Note (Signed)
CPAP.  

## 2016-07-21 NOTE — Assessment & Plan Note (Signed)
Has been worked up by nephrology.  Stable sodium.  Decrease alcohol intake.

## 2016-07-21 NOTE — Assessment & Plan Note (Signed)
Bowels better  Continue f/u with GI.

## 2016-07-21 NOTE — Assessment & Plan Note (Signed)
Discussed with him today.  Start zoloft as directed.  Follow.  Will come off tramadol.

## 2016-07-21 NOTE — Assessment & Plan Note (Signed)
Takes tramadol.  Does not feel helps.  Will taper off.

## 2016-07-21 NOTE — Assessment & Plan Note (Signed)
Followed by hematology 

## 2016-07-21 NOTE — Assessment & Plan Note (Signed)
Followed by cardiology.  Just saw Dr Saralyn Pilar.  Had stress test as outlined.  Stable.

## 2016-07-22 ENCOUNTER — Ambulatory Visit (INDEPENDENT_AMBULATORY_CARE_PROVIDER_SITE_OTHER): Payer: PPO | Admitting: Family Medicine

## 2016-07-22 ENCOUNTER — Encounter: Payer: Self-pay | Admitting: Family Medicine

## 2016-07-22 DIAGNOSIS — J449 Chronic obstructive pulmonary disease, unspecified: Secondary | ICD-10-CM | POA: Insufficient documentation

## 2016-07-22 DIAGNOSIS — J441 Chronic obstructive pulmonary disease with (acute) exacerbation: Secondary | ICD-10-CM | POA: Diagnosis not present

## 2016-07-22 MED ORDER — PREDNISONE 50 MG PO TABS: ORAL_TABLET | ORAL | 0 refills | Status: DC

## 2016-07-22 MED ORDER — HYDROCOD POLST-CPM POLST ER 10-8 MG/5ML PO SUER: 5.0000 mL | Freq: Two times a day (BID) | ORAL | 0 refills | Status: DC | PRN

## 2016-07-22 NOTE — Progress Notes (Signed)
Pre visit review using our clinic review tool, if applicable. No additional management support is needed unless otherwise documented below in the visit note. 

## 2016-07-22 NOTE — Patient Instructions (Signed)
Take the medications as prescribed.  Follow up if you fail to improve or worsen.  Take care  Dr. Lacinda Axon

## 2016-07-22 NOTE — Assessment & Plan Note (Signed)
Mild COPD exacerbation - productive cough. No increasing dyspnea. Treating with prednisone and tussionex. Holding off on antibiotic therapy at this time (as this is mild).

## 2016-07-22 NOTE — Progress Notes (Signed)
Subjective:  Patient ID: Troy Lopez, male    DOB: 1940/09/10  Age: 75 y.o. MRN: [id number]  CC: Cough, Runny nose, SOB  HPI:  74 year old male with CAD, COPD presents with the the above complaint.  Patient reports that he's not been feeling well since Saturday.  Patient states that he has been having runny nose, and cough. Cough is severe. Mildly productive. No associated fever. He reports he has shortness of breath. He states that this is no worse than it is normally. He reports associated body aches. No known exacerbating or relieving factors. No other associated symptoms. No other complaints at this time.   Social Hx   Social History   Social History  . Marital status: Married    Spouse name: N/A  . Number of children: 3  . Years of education: N/A   Social History Main Topics  . Smoking status: Former Smoker    Quit date: 11/01/2015  . Smokeless tobacco: Never Used     Comment: about 2 cigarettes per day, trying to quit  . Alcohol use 25.2 oz/week    42 Cans of beer per week     Comment: occas  . Drug use: No  . Sexual activity: Not Asked   Other Topics Concern  . None   Social History Narrative  . None    Review of Systems  Constitutional: Negative for fever.  Respiratory: Positive for cough.   Musculoskeletal:       Body aches.   Objective:  BP (!) 158/79 (BP Location: Left Arm, Patient Position: Sitting, Cuff Size: Normal)   Pulse 66   Temp 98.4 F (36.9 C) (Oral)   Resp 16   Wt 201 lb 6 oz (91.3 kg)   SpO2 95%   BMI 27.31 kg/m   BP/Weight 07/22/2016 07/17/2016 0000000  Systolic BP 0000000 [id number]  Diastolic BP 79 60 66  Wt. (Lbs) 201.38 200.4 201.4  BMI 27.31 27.18 28.09    Physical Exam  Constitutional: He is oriented to person, place, and time. He appears well-developed and well-nourished.  HENT:  Mouth/Throat: Oropharynx is clear and moist.  Cardiovascular: Normal rate and regular rhythm.   Pulmonary/Chest: Effort normal.  Scattered  wheezing.  Neurological: He is alert and oriented to person, place, and time.  Psychiatric: He has a normal mood and affect.  Vitals reviewed.  Lab Results  Component Value Date   WBC 10.0 04/19/2016   HGB 10.2 (L) 04/19/2016   HCT 30.8 (L) 04/19/2016   PLT 526.0 (H) 04/19/2016   GLUCOSE 97 04/01/2016   CHOL 134 02/14/2016   TRIG 42.0 02/14/2016   HDL 74.80 02/14/2016   LDLCALC 51 02/14/2016   ALT 10 02/14/2016   AST 14 02/14/2016   NA 129 (L) 04/01/2016   K 5.0 04/01/2016   CL 96 (L) 04/01/2016   CREATININE 1.22 04/01/2016   BUN 12 04/01/2016   CO2 24 04/01/2016   TSH 1.75 06/12/2015   PSA 1.33 02/14/2016   HGBA1C 5.7 03/04/2016    Assessment & Plan:   Problem List Items Addressed This Visit    COPD exacerbation ([place])    Mild COPD exacerbation - productive cough. No increasing dyspnea. Treating with prednisone and tussionex. Holding off on antibiotic therapy at this time (as this is mild).      Relevant Medications   predniSONE (DELTASONE) 50 MG tablet   chlorpheniramine-HYDROcodone (TUSSIONEX PENNKINETIC ER) 10-8 MG/5ML SUER      Meds ordered this  encounter  Medications  . predniSONE (DELTASONE) 50 MG tablet    Sig: 1 tablet daily x 5 days.    Dispense:  5 tablet    Refill:  0  . chlorpheniramine-HYDROcodone (TUSSIONEX PENNKINETIC ER) 10-8 MG/5ML SUER    Sig: Take 5 mLs by mouth every 12 (twelve) hours as needed.    Dispense:  115 mL    Refill:  0    Follow-up: PRN  [place]

## 2016-07-25 ENCOUNTER — Other Ambulatory Visit: Payer: Self-pay | Admitting: Family Medicine

## 2016-07-25 ENCOUNTER — Telehealth: Payer: Self-pay | Admitting: *Deleted

## 2016-07-25 MED ORDER — DOXYCYCLINE HYCLATE 100 MG PO TABS: 100.0000 mg | ORAL_TABLET | Freq: Two times a day (BID) | ORAL | 0 refills | Status: DC

## 2016-07-25 NOTE — Telephone Encounter (Signed)
Patient states Prednisone is keeping him awake at night and bothering his stomach.   He hasn't developed a fever.   Nasal mucus has turned yellow in color.  Please advise.

## 2016-07-25 NOTE — Telephone Encounter (Signed)
Stop the prednisone. Antibiotic sent.

## 2016-07-25 NOTE — Telephone Encounter (Signed)
Patient advised of below verbalized and understanding

## 2016-07-25 NOTE — Progress Notes (Signed)
do

## 2016-07-25 NOTE — Telephone Encounter (Signed)
Patient stated that he was prescribed prednisone, pt seems to think that the medication is not helping with cough and congestion. He was advised to call the office if he needed an antibiotic. [place] contact [phone]

## 2016-08-14 ENCOUNTER — Other Ambulatory Visit: Payer: Self-pay | Admitting: Internal Medicine

## 2016-08-30 ENCOUNTER — Other Ambulatory Visit: Payer: Self-pay | Admitting: *Deleted

## 2016-08-30 DIAGNOSIS — D509 Iron deficiency anemia, unspecified: Secondary | ICD-10-CM

## 2016-08-30 DIAGNOSIS — D472 Monoclonal gammopathy: Secondary | ICD-10-CM

## 2016-09-02 NOTE — Progress Notes (Deleted)
[facility]  [phone] [phone]) [phone]  ID: Troy Lopez OB: 1941-04-28  MR#: [id number]  DDU#:[id number]  Patient Care Team: Einar Pheasant, MD as PCP - General (Internal Medicine)  CHIEF COMPLAINT: MGUS, iron deficiency anemia  INTERVAL HISTORY: Patient last evaluated in clinic in December 2016. He is referred back for declining hemoglobin. He continues to feel well and remains asymptomatic. He does not complain of any weakness or fatigue. He has no neurologic complaints. He denies any fevers or recent illnesses. He has no pain. He has good appetite and denies weight loss. He has no chest pain or shortness of breath. He denies any nausea, vomiting, constipation, or diarrhea. He has no urinary complaints. Patient feels at his baseline and offers no specific complaints today.  REVIEW OF SYSTEMS:   Review of Systems  Constitutional: Negative.  Negative for fever, malaise/fatigue and weight loss.  Respiratory: Negative.  Negative for cough and shortness of breath.   Cardiovascular: Negative.  Negative for chest pain and leg swelling.  Gastrointestinal: Negative.  Negative for abdominal pain.  Musculoskeletal: Negative.   Neurological: Negative.  Negative for weakness.  Psychiatric/Behavioral: Negative.  The patient is not nervous/anxious.     As per HPI. Otherwise, a complete review of systems is negative.  PAST MEDICAL HISTORY: Past Medical History:  Diagnosis Date  . Adrenal gland anomaly    enlargement  . CAD (coronary artery disease)   . Carpal tunnel syndrome   . Colonic polyp   . COPD (chronic obstructive pulmonary disease) ([place])   . Degenerative disc disease, lumbar   . Depression   . Diverticulosis   . GERD (gastroesophageal reflux disease)   . Hypercholesterolemia   . Hyperkalemia   . Hypertension   . Irritable bowel syndrome   . Monoclonal gammopathy   . Personal history of tobacco use, presenting hazards to health 08/17/2015      PAST SURGICAL HISTORY: Past Surgical History:  Procedure Laterality Date  . BUNIONECTOMY  1989  . CARPAL TUNNEL RELEASE Left 2011   ulnar nerve sub muscular at elbow  . CHOLECYSTECTOMY  09/07  . ELECTROPHYSIOLOGIC STUDY N/A 11/15/2015   Procedure: CARDIOVERSION;  Surgeon: Isaias Cowman, MD;  Location: [facility] ORS;  Service: Cardiovascular;  Laterality: N/A;  . EYE SURGERY Bilateral 2010   cataract  . HEMORRHOID SURGERY      FAMILY HISTORY Family History  Problem Relation Age of Onset  . Stroke Mother   . Heart disease Father     MI - 50   . Colon cancer Neg Hx   . Prostate cancer Neg Hx        ADVANCED DIRECTIVES:    HEALTH MAINTENANCE: Social History  Substance Use Topics  . Smoking status: Former Smoker    Quit date: 11/01/2015  . Smokeless tobacco: Never Used     Comment: about 2 cigarettes per day, trying to quit  . Alcohol use 25.2 oz/week    42 Cans of beer per week     Comment: occas     Colonoscopy:  PAP:  Bone density:  Lipid panel:  Allergies  Allergen Reactions  . 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 acetaminophen (TYLENOL) 325 MG tablet Take 650 mg by mouth every 6 (six) hours as needed for pain.    Marland Kitchen amLODipine (NORVASC) 5 MG tablet Take 1 tablet (5 mg total)  by mouth daily. 90 tablet 0  . budesonide (ENTOCORT EC) 3 MG 24 hr capsule Take by mouth as needed.     . chlorpheniramine-HYDROcodone (TUSSIONEX PENNKINETIC ER) 10-8 MG/5ML SUER Take 5 mLs by mouth every 12 (twelve) hours as needed. 115 mL 0  . doxycycline (VIBRA-TABS) 100 MG tablet Take 1 tablet (100 mg total) by mouth 2 (two) times daily. 14 tablet 0  . ELIQUIS 5 MG TABS tablet Take 1 tablet by mouth 2 (two) times daily.    . fluticasone (FLONASE) 50 MCG/ACT nasal spray Place 2 sprays into both nostrils daily. 16 g 5  . fluticasone  furoate-vilanterol (BREO ELLIPTA) 100-25 MCG/INH AEPB Inhale 1 puff into the lungs daily. 60 each 11  . furosemide (LASIX) 20 MG tablet Take 1 tablet (20 mg total) by mouth daily as needed. 30 tablet 5  . gabapentin (NEURONTIN) 100 MG capsule Take two each morning, two each afternoon and three at bedtime 210 capsule 0  . hydrocortisone 2.5 % cream     . isosorbide mononitrate (IMDUR) 30 MG 24 hr tablet Take by mouth.    . loperamide (IMODIUM) 2 MG capsule Take 2 mg by mouth daily as needed.    Marland Kitchen losartan (COZAAR) 100 MG tablet Take 1 tablet (100 mg total) by mouth daily. 90 tablet 0  . metoprolol (LOPRESSOR) 100 MG tablet Take 1 tablet (100 mg total) by mouth 2 (two) times daily. 60 tablet 0  . mupirocin ointment (BACTROBAN) 2 % Place 1 application into the nose 2 (two) times daily. 22 g 0  . pantoprazole (PROTONIX) 40 MG tablet Take 1 tablet (40 mg total) by mouth 2 (two) times daily. 180 tablet 0  . predniSONE (DELTASONE) 50 MG tablet 1 tablet daily x 5 days. 5 tablet 0  . sertraline (ZOLOFT) 50 MG tablet Take 1 tablet (50 mg total) by mouth daily. 30 tablet 1  . traMADol (ULTRAM) 50 MG tablet Take 1 tablet (50 mg total) by mouth every 6 (six) hours as needed. for pain 120 tablet 2   No current facility-administered medications for this visit.     OBJECTIVE: There were no vitals filed for this visit.   There is no height or weight on file to calculate BMI.    ECOG FS:0 - Asymptomatic  General: Well-developed, well-nourished, no acute distress. Eyes: Pink conjunctiva, anicteric sclera. Lungs: Clear to auscultation bilaterally. Heart: Regular rate and rhythm. No rubs, murmurs, or gallops. Abdomen: Soft, nontender, nondistended. No organomegaly noted, normoactive bowel sounds. Musculoskeletal: No edema, cyanosis, or clubbing. Neuro: Alert, answering all questions appropriately. Cranial nerves grossly intact. Skin: No rashes or petechiae noted. Psych: Normal affect.   LAB RESULTS:  Lab  Results  Component Value Date   NA 129 (L) 04/01/2016   K 5.0 04/01/2016   CL 96 (L) 04/01/2016   CO2 24 04/01/2016   GLUCOSE 97 04/01/2016   BUN 12 04/01/2016   CREATININE 1.22 04/01/2016   CALCIUM 9.5 04/01/2016   PROT 8.5 (H) 02/14/2016   ALBUMIN 3.4 (L) 02/14/2016   AST 14 02/14/2016   ALT 10 02/14/2016   ALKPHOS 79 02/14/2016   BILITOT 0.7 02/14/2016   GFRNONAA 57 (L) 04/01/2016   GFRAA >60 04/01/2016    Lab Results  Component Value Date   WBC 10.0 04/19/2016   NEUTROABS 6.3 04/19/2016   HGB 10.2 (L) 04/19/2016   HCT 30.8 (L) 04/19/2016   MCV 99.4 04/19/2016   PLT 526.0 (H) 04/19/2016   Lab Results  Component Value Date   IRON 92 04/19/2016   TIBC 1,297 (H) 06/07/2014   IRONPCTSAT 8.2 (L) 04/19/2016    Lab Results  Component Value Date   FERRITIN 13.9 (L) 04/19/2016    STUDIES: No results found.  ASSESSMENT: MGUS, iron deficiency anemia.  PLAN:    1. MGUS:  Patient's most recent M spike from 2016 trended up slightly from approximately 1.7 to 2.3. He now has iron deficiency anemia, but no other evidence of endorgan damage. Previously, full workup was negative.  It is possible patient has smoldering myeloma, but this would require a bone marrow biopsy to confirm which patient does not wish to pursue at this time.  2. Iron deficiency anemia: Patient's hemoglobin and iron stores have trended down. He reports in a GI workup. Return to clinic later this week for 510 mg of IV Feraheme. Return to clinic in 2 weeks for her second infusion. Patient will then return to clinic in 3 months with repeat laboratory work and further evaluation.  Patient expressed understanding and was in agreement with this plan. He also understands that He can call clinic at any time with any questions, concerns, or complaints.    Lloyd Huger, MD   09/02/2016 10:10 PM

## 2016-09-04 ENCOUNTER — Inpatient Hospital Stay: Payer: PPO | Admitting: Oncology

## 2016-09-04 ENCOUNTER — Inpatient Hospital Stay: Payer: PPO

## 2016-09-04 ENCOUNTER — Ambulatory Visit: Payer: Self-pay | Admitting: Internal Medicine

## 2016-09-10 ENCOUNTER — Other Ambulatory Visit: Payer: Self-pay | Admitting: *Deleted

## 2016-09-10 MED ORDER — GABAPENTIN 100 MG PO CAPS: ORAL_CAPSULE | ORAL | 0 refills | Status: DC

## 2016-09-16 ENCOUNTER — Telehealth: Payer: Self-pay | Admitting: Internal Medicine

## 2016-09-16 NOTE — Progress Notes (Addendum)
[place]  Telephone:(336) [phone] Fax:(336) [phone]  ID: Troy Lopez OB: 25-Mar-1941  MR#: [id number]  VVZ#:[id number]  Patient Care Team: Einar Pheasant, MD as PCP - General (Internal Medicine)  CHIEF COMPLAINT: MGUS, iron deficiency anemia  INTERVAL HISTORY: Patient returns to clinic today for repeat laboratory work and further evaluation. He continues to feel well and remains asymptomatic. He does not complain of any weakness or fatigue. He has no neurologic complaints. He denies any fevers or recent illnesses. He has no pain. He has a good appetite and denies weight loss. He has no chest pain or shortness of breath. He denies any nausea, vomiting, constipation, or diarrhea. He has no urinary complaints. Patient feels at his baseline and offers no specific complaints today.  REVIEW OF SYSTEMS:   Review of Systems  Constitutional: Negative.  Negative for fever, malaise/fatigue and weight loss.  Respiratory: Negative.  Negative for cough and shortness of breath.   Cardiovascular: Negative.  Negative for chest pain and leg swelling.  Gastrointestinal: Negative.  Negative for abdominal pain, blood in stool, diarrhea and melena.  Musculoskeletal: Negative.   Neurological: Negative.  Negative for weakness.  Psychiatric/Behavioral: Negative.  The patient is not nervous/anxious.     As per HPI. Otherwise, a complete review of systems is negative.  PAST MEDICAL HISTORY: Past Medical History:  Diagnosis Date  . Adrenal gland anomaly    enlargement  . CAD (coronary artery disease)   . Carpal tunnel syndrome   . Colonic polyp   . COPD (chronic obstructive pulmonary disease) ([place])   . Degenerative disc disease, lumbar   . Depression   . Diverticulosis   . GERD (gastroesophageal reflux disease)   . Hypercholesterolemia   . Hyperkalemia   . Hypertension   . Irritable bowel syndrome   . Monoclonal gammopathy   . Personal history of tobacco use, presenting  hazards to health 08/17/2015    PAST SURGICAL HISTORY: Past Surgical History:  Procedure Laterality Date  . BUNIONECTOMY  1989  . CARPAL TUNNEL RELEASE Left 2011   ulnar nerve sub muscular at elbow  . CHOLECYSTECTOMY  09/07  . ELECTROPHYSIOLOGIC STUDY N/A 11/15/2015   Procedure: CARDIOVERSION;  Surgeon: Isaias Cowman, MD;  Location: [facility] ORS;  Service: Cardiovascular;  Laterality: N/A;  . EYE SURGERY Bilateral 2010   cataract  . HEMORRHOID SURGERY      FAMILY HISTORY Family History  Problem Relation Age of Onset  . Stroke Mother   . Heart disease Father     MI - 2   . Colon cancer Neg Hx   . Prostate cancer Neg Hx        ADVANCED DIRECTIVES:    HEALTH MAINTENANCE: Social History  Substance Use Topics  . Smoking status: Former Smoker    Quit date: 11/01/2015  . Smokeless tobacco: Never Used     Comment: about 2 cigarettes per day, trying to quit  . Alcohol use 25.2 oz/week    42 Cans of beer per week     Comment: occas     Colonoscopy:  PAP:  Bone density:  Lipid panel:  Allergies  Allergen Reactions  . 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 acetaminophen (TYLENOL) 325 MG tablet Take 650 mg by mouth every 6 (six) hours as needed for pain.    Marland Kitchen amLODipine (NORVASC) 5 MG tablet Take 1 tablet (  5 mg total) by mouth daily. 90 tablet 0  . budesonide (ENTOCORT EC) 3 MG 24 hr capsule Take by mouth as needed.     . chlorpheniramine-HYDROcodone (TUSSIONEX PENNKINETIC ER) 10-8 MG/5ML SUER Take 5 mLs by mouth every 12 (twelve) hours as needed. 115 mL 0  . ELIQUIS 5 MG TABS tablet Take 1 tablet by mouth 2 (two) times daily.    . fluticasone (FLONASE) 50 MCG/ACT nasal spray Place 2 sprays into both nostrils daily. 16 g 5  . fluticasone furoate-vilanterol (BREO ELLIPTA) 100-25 MCG/INH AEPB Inhale 1 puff into the lungs daily. 60  each 11  . furosemide (LASIX) 20 MG tablet Take 1 tablet (20 mg total) by mouth daily as needed. 30 tablet 5  . gabapentin (NEURONTIN) 100 MG capsule Take two each morning, two each afternoon and three at bedtime 210 capsule 0  . isosorbide mononitrate (IMDUR) 30 MG 24 hr tablet Take 30 mg by mouth daily.     Marland Kitchen loperamide (IMODIUM) 2 MG capsule Take 2 mg by mouth daily as needed.    Marland Kitchen losartan (COZAAR) 100 MG tablet Take 1 tablet (100 mg total) by mouth daily. 90 tablet 0  . metoprolol (LOPRESSOR) 100 MG tablet Take 1 tablet (100 mg total) by mouth 2 (two) times daily. 60 tablet 3  . pantoprazole (PROTONIX) 40 MG tablet Take 1 tablet (40 mg total) by mouth 2 (two) times daily. 180 tablet 0  . sertraline (ZOLOFT) 50 MG tablet Take 1 tablet (50 mg total) by mouth daily. 30 tablet 1  . vitamin B-12 (CYANOCOBALAMIN) 1000 MCG tablet Take by mouth.    . doxycycline (VIBRA-TABS) 100 MG tablet Take 1 tablet (100 mg total) by mouth 2 (two) times daily. (Patient not taking: Reported on 09/18/2016) 14 tablet 0  . hydrocortisone 2.5 % cream     . mupirocin ointment (BACTROBAN) 2 % Place 1 application into the nose 2 (two) times daily. (Patient not taking: Reported on 09/18/2016) 22 g 0   No current facility-administered medications for this visit.     OBJECTIVE: Vitals:   09/18/16 1406  BP: (!) 141/93  Pulse: 67  Resp: 18  Temp: 99.2 F (37.3 C)     Body mass index is 26.64 kg/m.    ECOG FS:0 - Asymptomatic  General: Well-developed, well-nourished, no acute distress. Eyes: Pink conjunctiva, anicteric sclera. Lungs: Clear to auscultation bilaterally. Heart: Regular rate and rhythm. No rubs, murmurs, or gallops. Abdomen: Soft, nontender, nondistended. No organomegaly noted, normoactive bowel sounds. Musculoskeletal: No edema, cyanosis, or clubbing. Neuro: Alert, answering all questions appropriately. Cranial nerves grossly intact. Skin: No rashes or petechiae noted. Psych: Normal affect.   LAB  RESULTS:  Lab Results  Component Value Date   NA 127 (L) 09/18/2016   K 3.9 09/18/2016   CL 94 (L) 09/18/2016   CO2 24 09/18/2016   GLUCOSE 121 (H) 09/18/2016   BUN 11 09/18/2016   CREATININE 1.02 09/18/2016   CALCIUM 9.2 09/18/2016   PROT 8.5 (H) 02/14/2016   ALBUMIN 3.4 (L) 02/14/2016   AST 14 02/14/2016   ALT 10 02/14/2016   ALKPHOS 79 02/14/2016   BILITOT 0.7 02/14/2016   GFRNONAA >60 09/18/2016   GFRAA >60 09/18/2016    Lab Results  Component Value Date   WBC 9.8 09/18/2016   NEUTROABS 7.5 (H) 09/18/2016   HGB 12.7 (L) 09/18/2016   HCT 37.1 (L) 09/18/2016   MCV 101.7 (H) 09/18/2016   PLT 402 09/18/2016   Lab Results  Component Value Date   IRON 104 09/18/2016   TIBC 1,185 (H) 09/18/2016   IRONPCTSAT 9 (L) 09/18/2016    Lab Results  Component Value Date   FERRITIN 21 (L) 09/18/2016   Lab Results  Component Value Date   TOTALPROTELP 8.6 (H) 09/18/2016   ALBUMINELP 3.7 09/18/2016   A1GS 0.2 09/18/2016   A2GS 0.6 09/18/2016   BETS 1.5 (H) 09/18/2016   BETA2SER 0.2 02/14/2016   GAMS 2.6 (H) 09/18/2016   MSPIKE 2.3 (H) 09/18/2016   SPEI Comment 09/18/2016    STUDIES: No results found.  ASSESSMENT: MGUS, iron deficiency anemia.  PLAN:    1. MGUS:  Patient's most recent M spike from Today is unchanged at 2.3 from 6 months ago. He now has iron deficiency anemia, but no other evidence of endorgan damage. Previously, full workup was negative.  It is possible patient has smoldering myeloma, but this would require a bone marrow biopsy to confirm which patient does not wish to pursue at this time.  2. Iron deficiency anemia: Patient's hemoglobin is essentially normal, although he continues to have decreased iron stores. Previously patient reportsed a full GI workup that confirmed underlying colitis that likely is contributing to his iron deficiency. He does not require additional IV iron today, but may require additional infusions in the future. His most recent  infusion was in October 2017.Patient will return to clinic in 3 months with repeat laboratory work and further evaluation.  Patient expressed understanding and was in agreement with this plan. He also understands that He can call clinic at any time with any questions, concerns, or complaints.    Lloyd Huger, MD   09/21/2016 8:30 AM   November 04, 2016: Patient underwent kyphoplasty of T12 vertebrae and surgical pathology revealed a plasma cell population concerning for underlying lymphoproliferative disorder such as myeloma. Plasma cell proportion could not be determined secondary to changes from patient's compression fracture.

## 2016-09-16 NOTE — Telephone Encounter (Signed)
metoprolol (LOPRESSOR) 100 MG tablet take 1 tablet by mouth 2 times daily.

## 2016-09-16 NOTE — Telephone Encounter (Signed)
sertraline (ZOLOFT) 50 MG tablet take 1 tablet by mouth daily.  Qty 30.

## 2016-09-17 ENCOUNTER — Other Ambulatory Visit: Payer: Self-pay | Admitting: Oncology

## 2016-09-17 NOTE — Telephone Encounter (Signed)
Pt called for refill on Lopressor and Zoloft looks like they have only been on for short time. Last office visit was on 07/17/16. Follow up is on 12-15-16. Do you want me to do refill until follow up?

## 2016-09-18 ENCOUNTER — Inpatient Hospital Stay: Payer: PPO

## 2016-09-18 ENCOUNTER — Telehealth: Payer: Self-pay | Admitting: Internal Medicine

## 2016-09-18 ENCOUNTER — Inpatient Hospital Stay: Payer: PPO | Attending: Oncology | Admitting: Oncology

## 2016-09-18 VITALS — BP 141/93 | HR 67 | Temp 99.2°F | Resp 18 | Wt 196.4 lb

## 2016-09-18 DIAGNOSIS — Q891 Congenital malformations of adrenal gland: Secondary | ICD-10-CM | POA: Insufficient documentation

## 2016-09-18 DIAGNOSIS — Z87891 Personal history of nicotine dependence: Secondary | ICD-10-CM | POA: Diagnosis not present

## 2016-09-18 DIAGNOSIS — J449 Chronic obstructive pulmonary disease, unspecified: Secondary | ICD-10-CM | POA: Insufficient documentation

## 2016-09-18 DIAGNOSIS — Z86010 Personal history of colonic polyps: Secondary | ICD-10-CM | POA: Insufficient documentation

## 2016-09-18 DIAGNOSIS — K219 Gastro-esophageal reflux disease without esophagitis: Secondary | ICD-10-CM | POA: Insufficient documentation

## 2016-09-18 DIAGNOSIS — E785 Hyperlipidemia, unspecified: Secondary | ICD-10-CM | POA: Diagnosis not present

## 2016-09-18 DIAGNOSIS — D472 Monoclonal gammopathy: Secondary | ICD-10-CM

## 2016-09-18 DIAGNOSIS — F329 Major depressive disorder, single episode, unspecified: Secondary | ICD-10-CM | POA: Diagnosis not present

## 2016-09-18 DIAGNOSIS — D509 Iron deficiency anemia, unspecified: Secondary | ICD-10-CM

## 2016-09-18 DIAGNOSIS — M5136 Other intervertebral disc degeneration, lumbar region: Secondary | ICD-10-CM | POA: Diagnosis not present

## 2016-09-18 DIAGNOSIS — K5790 Diverticulosis of intestine, part unspecified, without perforation or abscess without bleeding: Secondary | ICD-10-CM | POA: Diagnosis not present

## 2016-09-18 DIAGNOSIS — I10 Essential (primary) hypertension: Secondary | ICD-10-CM | POA: Insufficient documentation

## 2016-09-18 DIAGNOSIS — I2510 Atherosclerotic heart disease of native coronary artery without angina pectoris: Secondary | ICD-10-CM | POA: Insufficient documentation

## 2016-09-18 DIAGNOSIS — E7800 Pure hypercholesterolemia, unspecified: Secondary | ICD-10-CM | POA: Diagnosis not present

## 2016-09-18 DIAGNOSIS — K589 Irritable bowel syndrome without diarrhea: Secondary | ICD-10-CM | POA: Insufficient documentation

## 2016-09-18 LAB — CBC WITH DIFFERENTIAL/PLATELET
BASOS ABS: 0.1 10*3/uL (ref 0–0.1)
BASOS PCT: 1 %
EOS ABS: 0 10*3/uL (ref 0–0.7)
EOS PCT: 0 %
HCT: 37.1 % — ABNORMAL LOW (ref 40.0–52.0)
Hemoglobin: 12.7 g/dL — ABNORMAL LOW (ref 13.0–18.0)
Lymphocytes Relative: 18 %
Lymphs Abs: 1.7 10*3/uL (ref 1.0–3.6)
MCH: 34.8 pg — ABNORMAL HIGH (ref 26.0–34.0)
MCHC: 34.2 g/dL (ref 32.0–36.0)
MCV: 101.7 fL — AB (ref 80.0–100.0)
Monocytes Absolute: 0.4 10*3/uL (ref 0.2–1.0)
Monocytes Relative: 5 %
Neutro Abs: 7.5 10*3/uL — ABNORMAL HIGH (ref 1.4–6.5)
Neutrophils Relative %: 76 %
PLATELETS: 402 10*3/uL (ref 150–440)
RBC: 3.64 MIL/uL — AB (ref 4.40–5.90)
RDW: 15.2 % — ABNORMAL HIGH (ref 11.5–14.5)
WBC: 9.8 10*3/uL (ref 3.8–10.6)

## 2016-09-18 LAB — BASIC METABOLIC PANEL
ANION GAP: 9 (ref 5–15)
BUN: 11 mg/dL (ref 6–20)
CALCIUM: 9.2 mg/dL (ref 8.9–10.3)
CO2: 24 mmol/L (ref 22–32)
Chloride: 94 mmol/L — ABNORMAL LOW (ref 101–111)
Creatinine, Ser: 1.02 mg/dL (ref 0.61–1.24)
Glucose, Bld: 121 mg/dL — ABNORMAL HIGH (ref 65–99)
POTASSIUM: 3.9 mmol/L (ref 3.5–5.1)
SODIUM: 127 mmol/L — AB (ref 135–145)

## 2016-09-18 LAB — IRON AND TIBC
IRON: 104 ug/dL (ref 45–182)
SATURATION RATIOS: 9 % — AB (ref 17.9–39.5)
TIBC: 1185 ug/dL — AB (ref 250–450)
UIBC: 1078 ug/dL

## 2016-09-18 LAB — FERRITIN: FERRITIN: 21 ng/mL — AB (ref 24–336)

## 2016-09-18 MED ORDER — SERTRALINE HCL 50 MG PO TABS: 50.0000 mg | ORAL_TABLET | Freq: Every day | ORAL | 1 refills | Status: DC

## 2016-09-18 MED ORDER — METOPROLOL TARTRATE 100 MG PO TABS: ORAL_TABLET | ORAL | 3 refills | Status: DC

## 2016-09-18 NOTE — Progress Notes (Signed)
Patient is here today for follow up, he has had a flare up of Colitis with nausea and diarrhea.

## 2016-09-18 NOTE — Telephone Encounter (Signed)
rx ok'd for sertraline #30 with one refill and metoprolol #60 with 3 refills.  Per last office note, off tramadol.

## 2016-09-18 NOTE — Addendum Note (Signed)
Addended by: Elpidio Galea T on: 09/18/2016 07:13 AM   Modules accepted: Orders

## 2016-09-19 DIAGNOSIS — K1370 Unspecified lesions of oral mucosa: Secondary | ICD-10-CM | POA: Diagnosis not present

## 2016-09-19 LAB — KAPPA/LAMBDA LIGHT CHAINS
KAPPA, LAMDA LIGHT CHAIN RATIO: 4.41 — AB (ref 0.26–1.65)
Kappa free light chain: 62.2 mg/L — ABNORMAL HIGH (ref 3.3–19.4)
LAMDA FREE LIGHT CHAINS: 14.1 mg/L (ref 5.7–26.3)

## 2016-09-19 LAB — IGG, IGA, IGM
IgA: 63 mg/dL (ref 61–437)
IgG (Immunoglobin G), Serum: 584 mg/dL — ABNORMAL LOW (ref 700–1600)
IgM, Serum: 3581 mg/dL — ABNORMAL HIGH (ref 15–143)

## 2016-09-20 LAB — PROTEIN ELECTROPHORESIS, SERUM
A/G Ratio: 0.8 (ref 0.7–1.7)
ALBUMIN ELP: 3.7 g/dL (ref 2.9–4.4)
ALPHA-1-GLOBULIN: 0.2 g/dL (ref 0.0–0.4)
ALPHA-2-GLOBULIN: 0.6 g/dL (ref 0.4–1.0)
Beta Globulin: 1.5 g/dL — ABNORMAL HIGH (ref 0.7–1.3)
GLOBULIN, TOTAL: 4.9 g/dL — AB (ref 2.2–3.9)
Gamma Globulin: 2.6 g/dL — ABNORMAL HIGH (ref 0.4–1.8)
M-SPIKE, %: 2.3 g/dL — AB
TOTAL PROTEIN ELP: 8.6 g/dL — AB (ref 6.0–8.5)

## 2016-09-30 DIAGNOSIS — M4606 Spinal enthesopathy, lumbar region: Secondary | ICD-10-CM | POA: Diagnosis not present

## 2016-09-30 DIAGNOSIS — M9903 Segmental and somatic dysfunction of lumbar region: Secondary | ICD-10-CM | POA: Diagnosis not present

## 2016-09-30 DIAGNOSIS — M62830 Muscle spasm of back: Secondary | ICD-10-CM | POA: Diagnosis not present

## 2016-10-02 DIAGNOSIS — M9903 Segmental and somatic dysfunction of lumbar region: Secondary | ICD-10-CM | POA: Diagnosis not present

## 2016-10-02 DIAGNOSIS — M62830 Muscle spasm of back: Secondary | ICD-10-CM | POA: Diagnosis not present

## 2016-10-02 DIAGNOSIS — M4606 Spinal enthesopathy, lumbar region: Secondary | ICD-10-CM | POA: Diagnosis not present

## 2016-10-03 DIAGNOSIS — M9903 Segmental and somatic dysfunction of lumbar region: Secondary | ICD-10-CM | POA: Diagnosis not present

## 2016-10-03 DIAGNOSIS — M4606 Spinal enthesopathy, lumbar region: Secondary | ICD-10-CM | POA: Diagnosis not present

## 2016-10-03 DIAGNOSIS — M62830 Muscle spasm of back: Secondary | ICD-10-CM | POA: Diagnosis not present

## 2016-10-07 DIAGNOSIS — M4606 Spinal enthesopathy, lumbar region: Secondary | ICD-10-CM | POA: Diagnosis not present

## 2016-10-07 DIAGNOSIS — M9903 Segmental and somatic dysfunction of lumbar region: Secondary | ICD-10-CM | POA: Diagnosis not present

## 2016-10-07 DIAGNOSIS — M62830 Muscle spasm of back: Secondary | ICD-10-CM | POA: Diagnosis not present

## 2016-10-08 DIAGNOSIS — M9903 Segmental and somatic dysfunction of lumbar region: Secondary | ICD-10-CM | POA: Diagnosis not present

## 2016-10-08 DIAGNOSIS — M4606 Spinal enthesopathy, lumbar region: Secondary | ICD-10-CM | POA: Diagnosis not present

## 2016-10-08 DIAGNOSIS — M62830 Muscle spasm of back: Secondary | ICD-10-CM | POA: Diagnosis not present

## 2016-10-10 DIAGNOSIS — M4606 Spinal enthesopathy, lumbar region: Secondary | ICD-10-CM | POA: Diagnosis not present

## 2016-10-10 DIAGNOSIS — M62830 Muscle spasm of back: Secondary | ICD-10-CM | POA: Diagnosis not present

## 2016-10-10 DIAGNOSIS — M9903 Segmental and somatic dysfunction of lumbar region: Secondary | ICD-10-CM | POA: Diagnosis not present

## 2016-10-11 ENCOUNTER — Encounter: Payer: Self-pay | Admitting: Internal Medicine

## 2016-10-11 NOTE — Telephone Encounter (Signed)
Please call pt and let him know that if he is having increased back pain, he will need to be evaluated.  Unable to call in stronger pain medication.   Needs to be seen.  Also, can call ortho and see if they have earlier appt.

## 2016-10-14 NOTE — Telephone Encounter (Signed)
Called patient back and advised of below he will contact ortho to see if they have sooner appointment.

## 2016-10-17 ENCOUNTER — Other Ambulatory Visit: Payer: Self-pay

## 2016-10-17 MED ORDER — LOSARTAN POTASSIUM 100 MG PO TABS: 100.0000 mg | ORAL_TABLET | Freq: Every day | ORAL | 0 refills | Status: DC

## 2016-10-17 MED ORDER — AMLODIPINE BESYLATE 5 MG PO TABS: 5.0000 mg | ORAL_TABLET | Freq: Every day | ORAL | 0 refills | Status: DC

## 2016-10-22 ENCOUNTER — Other Ambulatory Visit: Payer: Self-pay | Admitting: Orthopedic Surgery

## 2016-10-22 DIAGNOSIS — S22080A Wedge compression fracture of T11-T12 vertebra, initial encounter for closed fracture: Secondary | ICD-10-CM

## 2016-10-22 DIAGNOSIS — M545 Low back pain: Secondary | ICD-10-CM | POA: Diagnosis not present

## 2016-10-22 DIAGNOSIS — M4316 Spondylolisthesis, lumbar region: Secondary | ICD-10-CM | POA: Diagnosis not present

## 2016-10-23 ENCOUNTER — Ambulatory Visit
Admission: RE | Admit: 2016-10-23 | Discharge: 2016-10-23 | Disposition: A | Discharge status: Home / Self Care | Payer: PPO | Source: Ambulatory Visit | Attending: Orthopedic Surgery | Admitting: Orthopedic Surgery

## 2016-10-23 DIAGNOSIS — D472 Monoclonal gammopathy: Secondary | ICD-10-CM | POA: Insufficient documentation

## 2016-10-23 DIAGNOSIS — X58XXXA Exposure to other specified factors, initial encounter: Secondary | ICD-10-CM | POA: Diagnosis not present

## 2016-10-23 DIAGNOSIS — M48061 Spinal stenosis, lumbar region without neurogenic claudication: Secondary | ICD-10-CM | POA: Insufficient documentation

## 2016-10-23 DIAGNOSIS — M4804 Spinal stenosis, thoracic region: Secondary | ICD-10-CM | POA: Insufficient documentation

## 2016-10-23 DIAGNOSIS — S22080A Wedge compression fracture of T11-T12 vertebra, initial encounter for closed fracture: Secondary | ICD-10-CM | POA: Diagnosis not present

## 2016-10-23 DIAGNOSIS — M545 Low back pain: Secondary | ICD-10-CM | POA: Diagnosis not present

## 2016-10-29 ENCOUNTER — Telehealth: Payer: Self-pay | Admitting: Internal Medicine

## 2016-10-29 NOTE — Telephone Encounter (Signed)
Left pt message asking to call Allison back directly at [phone] to schedule AWV. Thanks! °

## 2016-10-30 DIAGNOSIS — S22080A Wedge compression fracture of T11-T12 vertebra, initial encounter for closed fracture: Secondary | ICD-10-CM | POA: Diagnosis not present

## 2016-11-01 ENCOUNTER — Encounter
Admission: RE | Admit: 2016-11-01 | Discharge: 2016-11-01 | Disposition: A | Discharge status: Home / Self Care | Payer: PPO | Source: Ambulatory Visit | Attending: Orthopedic Surgery | Admitting: Orthopedic Surgery

## 2016-11-01 DIAGNOSIS — J449 Chronic obstructive pulmonary disease, unspecified: Secondary | ICD-10-CM | POA: Diagnosis not present

## 2016-11-01 DIAGNOSIS — K219 Gastro-esophageal reflux disease without esophagitis: Secondary | ICD-10-CM | POA: Diagnosis not present

## 2016-11-01 DIAGNOSIS — D649 Anemia, unspecified: Secondary | ICD-10-CM | POA: Diagnosis not present

## 2016-11-01 DIAGNOSIS — Z7951 Long term (current) use of inhaled steroids: Secondary | ICD-10-CM | POA: Diagnosis not present

## 2016-11-01 DIAGNOSIS — Z7901 Long term (current) use of anticoagulants: Secondary | ICD-10-CM | POA: Diagnosis not present

## 2016-11-01 DIAGNOSIS — Z79899 Other long term (current) drug therapy: Secondary | ICD-10-CM | POA: Diagnosis not present

## 2016-11-01 DIAGNOSIS — G4730 Sleep apnea, unspecified: Secondary | ICD-10-CM | POA: Diagnosis not present

## 2016-11-01 DIAGNOSIS — I2510 Atherosclerotic heart disease of native coronary artery without angina pectoris: Secondary | ICD-10-CM | POA: Diagnosis not present

## 2016-11-01 DIAGNOSIS — Z87891 Personal history of nicotine dependence: Secondary | ICD-10-CM | POA: Diagnosis not present

## 2016-11-01 DIAGNOSIS — S22089A Unspecified fracture of T11-T12 vertebra, initial encounter for closed fracture: Secondary | ICD-10-CM | POA: Diagnosis not present

## 2016-11-01 DIAGNOSIS — I498 Other specified cardiac arrhythmias: Secondary | ICD-10-CM | POA: Diagnosis not present

## 2016-11-01 DIAGNOSIS — W19XXXA Unspecified fall, initial encounter: Secondary | ICD-10-CM | POA: Diagnosis not present

## 2016-11-01 DIAGNOSIS — I10 Essential (primary) hypertension: Secondary | ICD-10-CM | POA: Diagnosis not present

## 2016-11-01 HISTORY — DX: Polyneuropathy, unspecified: G62.9

## 2016-11-01 HISTORY — DX: Sleep apnea, unspecified: G47.30

## 2016-11-01 HISTORY — DX: Dyspnea, unspecified: R06.00

## 2016-11-01 LAB — SURGICAL PCR SCREEN
MRSA, PCR: NEGATIVE
Staphylococcus aureus: NEGATIVE

## 2016-11-01 LAB — POTASSIUM: Potassium: 5.4 mmol/L — ABNORMAL HIGH (ref 3.5–5.1)

## 2016-11-01 NOTE — Patient Instructions (Addendum)
Your procedure is scheduled on: 11/04/16 Mon @ 12:00 Report to Same Day Surgery 2nd floor medical mall [facility] Entrance-take elevator on left to 2nd floor.  Check in with surgery information desk.)   Remember: Instructions that are not followed completely may result in serious medical risk, up to and including death, or upon the discretion of your surgeon and anesthesiologist your surgery may need to be rescheduled.    _x___ 1. Do not eat food or drink liquids after midnight. No gum chewing or                              hard candies.     __x__ 2. No Alcohol for 24 hours before or after surgery.   __x__3. No Smoking for 24 prior to surgery.   ____  4. Bring all medications with you on the day of surgery if instructed.    __x__ 5. Notify your doctor if there is any change in your medical condition     (cold, fever, infections).     Do not wear jewelry, make-up, hairpins, clips or nail polish.  Do not wear lotions, powders, or perfumes. You may wear deodorant.  Do not shave 48 hours prior to surgery. Men may shave face and neck.  Do not bring valuables to the hospital.    [facility] is not responsible for any belongings or valuables.               Contacts, dentures or bridgework may not be worn into surgery.  Leave your suitcase in the car. After surgery it may be brought to your room.  For patients admitted to the hospital, discharge time is determined by your                       treatment team.   Patients discharged the day of surgery will not be allowed to drive home.  You will need someone to drive you home and stay with you the night of your procedure.    Please read over the following fact sheets that you were given:   [facility] Preparing for Surgery and or MRSA Information   _x___ Take anti-hypertensive (unless it includes a diuretic), cardiac, seizure, asthma,     anti-reflux and psychiatric medicines. These include:  1. isosorbide mononitrate  (IMDUR  2.amLODipine (NORVASC  3.levalbuterol (XOPENEX HFA  4.losartan (COZAAR  5.metoprolol (LOPRESSOR  6.oxycodone (OXY-IR)  If needed  7.pantoprazole (PROTONIX  8.sertraline (ZOLOFT)  ____Fleets enema or Magnesium Citrate as directed.   _x___ Use CHG Soap or sage wipes as directed on instruction sheet   ____ Use inhalers on the day of surgery and bring to hospital day of surgery  ____ Stop Metformin and Janumet 2 days prior to surgery.    ____ Take 1/2 of usual insulin dose the night before surgery and none on the morning     surgery.   _x___ Follow recommendations from Cardiologist, Pulmonologist or PCP regarding          stopping Aspirin, Coumadin, Pllavix ,Eliquis, Effient, or Pradaxa, and Pletal. Has already stopped Eliquis  X____Stop Anti-inflammatories such as Advil, Aleve, Ibuprofen, Motrin, Naproxen, Naprosyn, Goodies powders or aspirin products. OK to take Tylenol and                          Celebrex.   _x___ Stop supplements until after surgery.  But may continue Vitamin D, Vitamin B,       and multivitamin.   ____ Bring C-Pap to the hospital.

## 2016-11-03 MED ORDER — CEFAZOLIN SODIUM-DEXTROSE 2-4 GM/100ML-% IV SOLN
2.0000 g | Freq: Once | INTRAVENOUS | Status: AC
  Administered 2016-11-04: 2 g via INTRAVENOUS

## 2016-11-04 ENCOUNTER — Ambulatory Visit: Payer: PPO | Admitting: Anesthesiology

## 2016-11-04 ENCOUNTER — Encounter
Admission: RE | Disposition: A | Discharge status: Home / Self Care | Payer: Self-pay | Source: Ambulatory Visit | Attending: Orthopedic Surgery

## 2016-11-04 ENCOUNTER — Ambulatory Visit
Admission: RE | Admit: 2016-11-04 | Discharge: 2016-11-04 | Disposition: A | Discharge status: Home / Self Care | Payer: PPO | Source: Ambulatory Visit | Attending: Orthopedic Surgery | Admitting: Orthopedic Surgery

## 2016-11-04 ENCOUNTER — Encounter: Payer: Self-pay | Admitting: *Deleted

## 2016-11-04 ENCOUNTER — Ambulatory Visit: Payer: PPO

## 2016-11-04 DIAGNOSIS — M4854XA Collapsed vertebra, not elsewhere classified, thoracic region, initial encounter for fracture: Secondary | ICD-10-CM | POA: Diagnosis not present

## 2016-11-04 DIAGNOSIS — I10 Essential (primary) hypertension: Secondary | ICD-10-CM | POA: Diagnosis not present

## 2016-11-04 DIAGNOSIS — I2510 Atherosclerotic heart disease of native coronary artery without angina pectoris: Secondary | ICD-10-CM | POA: Insufficient documentation

## 2016-11-04 DIAGNOSIS — J449 Chronic obstructive pulmonary disease, unspecified: Secondary | ICD-10-CM | POA: Insufficient documentation

## 2016-11-04 DIAGNOSIS — D649 Anemia, unspecified: Secondary | ICD-10-CM | POA: Diagnosis not present

## 2016-11-04 DIAGNOSIS — I498 Other specified cardiac arrhythmias: Secondary | ICD-10-CM | POA: Insufficient documentation

## 2016-11-04 DIAGNOSIS — M545 Low back pain: Secondary | ICD-10-CM | POA: Diagnosis not present

## 2016-11-04 DIAGNOSIS — K219 Gastro-esophageal reflux disease without esophagitis: Secondary | ICD-10-CM | POA: Insufficient documentation

## 2016-11-04 DIAGNOSIS — Z7951 Long term (current) use of inhaled steroids: Secondary | ICD-10-CM | POA: Insufficient documentation

## 2016-11-04 DIAGNOSIS — S22089A Unspecified fracture of T11-T12 vertebra, initial encounter for closed fracture: Secondary | ICD-10-CM | POA: Insufficient documentation

## 2016-11-04 DIAGNOSIS — Z7901 Long term (current) use of anticoagulants: Secondary | ICD-10-CM | POA: Insufficient documentation

## 2016-11-04 DIAGNOSIS — S22080A Wedge compression fracture of T11-T12 vertebra, initial encounter for closed fracture: Secondary | ICD-10-CM | POA: Diagnosis not present

## 2016-11-04 DIAGNOSIS — G4730 Sleep apnea, unspecified: Secondary | ICD-10-CM | POA: Insufficient documentation

## 2016-11-04 DIAGNOSIS — Z87891 Personal history of nicotine dependence: Secondary | ICD-10-CM | POA: Insufficient documentation

## 2016-11-04 DIAGNOSIS — Z79899 Other long term (current) drug therapy: Secondary | ICD-10-CM | POA: Insufficient documentation

## 2016-11-04 DIAGNOSIS — Z419 Encounter for procedure for purposes other than remedying health state, unspecified: Secondary | ICD-10-CM

## 2016-11-04 DIAGNOSIS — W19XXXA Unspecified fall, initial encounter: Secondary | ICD-10-CM | POA: Insufficient documentation

## 2016-11-04 HISTORY — PX: KYPHOPLASTY: SHX5884

## 2016-11-04 LAB — POCT I-STAT 4, (NA,K, GLUC, HGB,HCT)
Glucose, Bld: 100 mg/dL — ABNORMAL HIGH (ref 65–99)
HEMATOCRIT: 39 % (ref 39.0–52.0)
HEMOGLOBIN: 13.3 g/dL (ref 13.0–17.0)
POTASSIUM: 4.9 mmol/L (ref 3.5–5.1)
SODIUM: 128 mmol/L — AB (ref 135–145)

## 2016-11-04 SURGERY — KYPHOPLASTY
Anesthesia: General | Wound class: Clean

## 2016-11-04 MED ORDER — FENTANYL CITRATE (PF) 100 MCG/2ML IJ SOLN: 25.0000 ug | INTRAMUSCULAR | Status: DC | PRN

## 2016-11-04 MED ORDER — LIDOCAINE HCL (PF) 2 % IJ SOLN
INTRAMUSCULAR | Status: AC
  Filled 2016-11-04: qty 2

## 2016-11-04 MED ORDER — PROPOFOL 500 MG/50ML IV EMUL
INTRAVENOUS | Status: AC
  Filled 2016-11-04: qty 50

## 2016-11-04 MED ORDER — KETAMINE HCL 10 MG/ML IJ SOLN
INTRAMUSCULAR | Status: AC
  Filled 2016-11-04: qty 1

## 2016-11-04 MED ORDER — LACTATED RINGERS IV SOLN
INTRAVENOUS | Status: DC
  Administered 2016-11-04: 13:00:00 via INTRAVENOUS

## 2016-11-04 MED ORDER — CEFAZOLIN SODIUM-DEXTROSE 2-4 GM/100ML-% IV SOLN
INTRAVENOUS | Status: AC
  Filled 2016-11-04: qty 100

## 2016-11-04 MED ORDER — LIDOCAINE HCL (PF) 1 % IJ SOLN
INTRAMUSCULAR | Status: AC
  Filled 2016-11-04: qty 60

## 2016-11-04 MED ORDER — LIDOCAINE 2% (20 MG/ML) 5 ML SYRINGE
INTRAMUSCULAR | Status: DC | PRN
  Administered 2016-11-04: 50 mg via INTRAVENOUS

## 2016-11-04 MED ORDER — TRAMADOL HCL 50 MG PO TABS: 50.0000 mg | ORAL_TABLET | Freq: Four times a day (QID) | ORAL | 0 refills | Status: DC | PRN

## 2016-11-04 MED ORDER — IOPAMIDOL (ISOVUE-M 200) INJECTION 41%
INTRAMUSCULAR | Status: DC | PRN
  Administered 2016-11-04: 10 mL

## 2016-11-04 MED ORDER — KETAMINE HCL 10 MG/ML IJ SOLN
INTRAMUSCULAR | Status: DC | PRN
  Administered 2016-11-04: 20 mg via INTRAVENOUS
  Administered 2016-11-04: 50 mg via INTRAVENOUS

## 2016-11-04 MED ORDER — PROPOFOL 10 MG/ML IV BOLUS
INTRAVENOUS | Status: DC | PRN
Start: 2016-11-04 — End: 2016-11-04
  Administered 2016-11-04: 30 mg via INTRAVENOUS
  Administered 2016-11-04: 8.5 mg via INTRAVENOUS

## 2016-11-04 MED ORDER — PROPOFOL 500 MG/50ML IV EMUL
INTRAVENOUS | Status: DC | PRN
  Administered 2016-11-04: 100 ug/kg/min via INTRAVENOUS

## 2016-11-04 MED ORDER — IOPAMIDOL (ISOVUE-M 200) INJECTION 41%
INTRAMUSCULAR | Status: AC
  Filled 2016-11-04: qty 20

## 2016-11-04 MED ORDER — ACETAMINOPHEN 10 MG/ML IV SOLN
INTRAVENOUS | Status: AC
  Filled 2016-11-04: qty 100

## 2016-11-04 MED ORDER — LIDOCAINE HCL 1 % IJ SOLN
INTRAMUSCULAR | Status: DC | PRN
  Administered 2016-11-04: 30 mL

## 2016-11-04 MED ORDER — BUPIVACAINE-EPINEPHRINE (PF) 0.5% -1:200000 IJ SOLN
INTRAMUSCULAR | Status: DC | PRN
  Administered 2016-11-04: 20 mL

## 2016-11-04 MED ORDER — ONDANSETRON HCL 4 MG/2ML IJ SOLN: 4.0000 mg | Freq: Once | INTRAMUSCULAR | Status: DC | PRN

## 2016-11-04 MED ORDER — ACETAMINOPHEN 10 MG/ML IV SOLN
INTRAVENOUS | Status: DC | PRN
  Administered 2016-11-04: 1000 mg via INTRAVENOUS

## 2016-11-04 MED ORDER — BUPIVACAINE-EPINEPHRINE (PF) 0.5% -1:200000 IJ SOLN
INTRAMUSCULAR | Status: AC
  Filled 2016-11-04: qty 30

## 2016-11-04 SURGICAL SUPPLY — 15 items
CEMENT KYPHON CX01A KIT/MIXER (Cement) ×3 IMPLANT
DEVICE BIOPSY BONE KYPHX (INSTRUMENTS) ×3 IMPLANT
DRAPE C-ARM XRAY 36X54 (DRAPES) ×3 IMPLANT
DURAPREP 26ML APPLICATOR (WOUND CARE) ×3 IMPLANT
GLOVE SURG SYN 9.0  PF PI (GLOVE) ×2
GLOVE SURG SYN 9.0 PF PI (GLOVE) ×1 IMPLANT
GOWN SRG 2XL LVL 4 RGLN SLV (GOWNS) ×1 IMPLANT
GOWN STRL NON-REIN 2XL LVL4 (GOWNS) ×2
GOWN STRL REUS W/ TWL LRG LVL3 (GOWN DISPOSABLE) ×1 IMPLANT
GOWN STRL REUS W/TWL LRG LVL3 (GOWN DISPOSABLE) ×2
LIQUID BAND (GAUZE/BANDAGES/DRESSINGS) ×3 IMPLANT
PACK KYPHOPLASTY (MISCELLANEOUS) ×3 IMPLANT
STRAP SAFETY BODY (MISCELLANEOUS) ×3 IMPLANT
TRAY KYPHOPAK 15/3 EXPRESS 1ST (MISCELLANEOUS) ×3 IMPLANT
TRAY KYPHOPAK 20/3 EXPRESS 1ST (MISCELLANEOUS) IMPLANT

## 2016-11-04 NOTE — Progress Notes (Signed)
Procedural time complete.

## 2016-11-04 NOTE — Discharge Instructions (Addendum)
Minimize activities today resume more normal activities tomorrow as tolerated. Remove Band-Aid on Wednesday then okay to shower  [place]   1) The drugs that you were given will stay in your system until tomorrow so for the next 24 hours you should not:  A) Drive an automobile B) Make any legal decisions C) Drink any alcoholic beverage   2) You may resume regular meals tomorrow.  Today it is better to start with liquids and gradually work up to solid foods.  You may eat anything you prefer, but it is better to start with liquids, then soup and crackers, and gradually work up to solid foods.   3) Please notify your doctor immediately if you have any unusual bleeding, trouble breathing, redness and pain at the surgery site, drainage, fever, or pain not relieved by medication.    4) Additional Instructions:        Please contact your physician with any problems or Same Day Surgery at [phone], Monday through Friday 6 am to 4 pm, or [place] at [facility] number at [phone].

## 2016-11-04 NOTE — Progress Notes (Signed)
Patient took tramadol this morning also with his other Medicines, Dr. Rudene Christians aware.  Patient states he has not Taken this in a while but he did this morning.

## 2016-11-04 NOTE — Transfer of Care (Signed)
Immediate Anesthesia Transfer of Care Note  Patient: Troy Lopez  Procedure(s) Performed: Procedure(s): KYPHOPLASTY T 12 (N/A)  Patient Location: PACU  Anesthesia Type:General  Level of Consciousness: awake, alert  and oriented  Airway & Oxygen Therapy: Patient Spontanous Breathing and Patient connected to nasal cannula oxygen  Post-op Assessment: Report given to RN and Post -op Vital signs reviewed and stable  Post vital signs: Reviewed and stable  Last Vitals:  Vitals:   11/04/16 1232  BP: 137/71  Pulse: (!) 59  Resp: (!) 22  Temp: 36.4 C    Last Pain:  Vitals:   11/04/16 1232  TempSrc: Oral  PainSc: 5          Complications: No apparent anesthesia complications

## 2016-11-04 NOTE — H&P (Signed)
Reviewed paper H+P, will be scanned into chart. Patient examined, unchanged. No changes noted.  

## 2016-11-04 NOTE — Progress Notes (Signed)
Patient out of department.

## 2016-11-04 NOTE — Anesthesia Post-op Follow-up Note (Cosign Needed)
Anesthesia QCDR form completed.        

## 2016-11-04 NOTE — Op Note (Signed)
11/04/2016  2:20 PM  PATIENT:  Troy Lopez  76 y.o. male  PRE-OPERATIVE DIAGNOSIS:  compression fracture of t12 vertebra  POST-OPERATIVE DIAGNOSIS:  compression fracture of t12 vertebra  PROCEDURE:  Procedure(s): KYPHOPLASTY T 12 (N/A)  SURGEON: Laurene Footman, MD  ASSISTANTS: None  ANESTHESIA:   local and MAC  EBL:  Total I/O In: 400 [I.V.:400] Out: 5 [Blood:5]  BLOOD ADMINISTERED:none  DRAINS: none   LOCAL MEDICATIONS USED:  MARCAINE    and XYLOCAINE   SPECIMEN:  Source of Specimen:  T12 vertebral body  DISPOSITION OF SPECIMEN:  PATHOLOGY  COUNTS:  YES  TOURNIQUET:  * No tourniquets in log *  IMPLANTS: Bone cement  DICTATION: .Dragon Dictation patient was brought to the operating room and after adequate anesthesia was obtained, patient was placed prone. After bringing in C-arm and getting good visualization in both AP and lateral projections, back was prepped with alcohol and after appropriate patient identification and timeout procedures local anesthetic was infiltrated on the right and left side in the area of the planned incision. The back was then prepped and draped in sterile fashion and repeat timeout procedure carried out. The right side first a spinal needle was placed onto the pedicle and a mixture of Xylocaine and half percent Sensorcaine with epinephrine was infiltrated down to the bone. Identical procedure carried out on the left. Small incision was made and trocar advanced into the vertebral body on the right side with biopsy obtained care being taken to stay within the bounds of the pedicle. Drilling was carried out and then the left side was accessed in identical fashion without the biopsy. Balloons were inserted and inflated with about 8 cc inflation of the vertebral body with partial correction of the kyphosis. The right side was addressed first filling this with bone cement was appropriate consistency with a total of about 8/2 cc of bone cement  infiltrated into the body getting good fill right and left side superior to inferior filling cleft of the fracture site visible on x-ray without extravasation noted. After the cemented set the trochars removed and permanent C-arm views were obtained. The wounds closed with Dermabond followed by a Band-Aid  PLAN OF CARE: Discharge to home after PACU  PATIENT DISPOSITION:  PACU - hemodynamically stable.

## 2016-11-04 NOTE — Anesthesia Postprocedure Evaluation (Signed)
Anesthesia Post Note  Patient: Troy Lopez  Procedure(s) Performed: Procedure(s) (LRB): KYPHOPLASTY T 12 (N/A)  Patient location during evaluation: PACU Anesthesia Type: General Level of consciousness: awake Pain management: pain level controlled Vital Signs Assessment: post-procedure vital signs reviewed and stable Respiratory status: spontaneous breathing Cardiovascular status: stable Anesthetic complications: no     Last Vitals:  Vitals:   11/04/16 1415 11/04/16 1434  BP: 95/63 107/68  Pulse: (!) 52 (!) 50  Resp: 20 11  Temp: (!) 36 C     Last Pain:  Vitals:   11/04/16 1415  TempSrc: Temporal  PainSc: 0-No pain                 VAN STAVEREN,Kriti Katayama

## 2016-11-04 NOTE — Anesthesia Preprocedure Evaluation (Signed)
Anesthesia Evaluation  Patient identified by MRN, date of birth, ID band Patient awake    Reviewed: Allergy & Precautions, NPO status , Patient's Chart, lab work & pertinent test results  Airway Mallampati: II       Dental  (+) Upper Dentures, Lower Dentures   Pulmonary shortness of breath, COPD, former smoker,     + decreased breath sounds      Cardiovascular Exercise Tolerance: Poor hypertension, Pt. on medications + CAD   Rhythm:Regular     Neuro/Psych Depression    GI/Hepatic Neg liver ROS, GERD  ,  Endo/Other  negative endocrine ROS  Renal/GU negative Renal ROS     Musculoskeletal   Abdominal Normal abdominal exam  (+)   Peds  Hematology  (+) anemia ,   Anesthesia Other Findings   Reproductive/Obstetrics                             Anesthesia Physical Anesthesia Plan  ASA: III  Anesthesia Plan: General   Post-op Pain Management:    Induction: Intravenous  Airway Management Planned: Natural Airway and Nasal Cannula  Additional Equipment:   Intra-op Plan:   Post-operative Plan:   Informed Consent: I have reviewed the patients History and Physical, chart, labs and discussed the procedure including the risks, benefits and alternatives for the proposed anesthesia with the patient or authorized representative who has indicated his/her understanding and acceptance.     Plan Discussed with: CRNA  Anesthesia Plan Comments:         Anesthesia Quick Evaluation

## 2016-11-11 ENCOUNTER — Other Ambulatory Visit: Payer: Self-pay | Admitting: Internal Medicine

## 2016-11-13 LAB — SURGICAL PATHOLOGY

## 2016-11-14 DIAGNOSIS — K1321 Leukoplakia of oral mucosa, including tongue: Secondary | ICD-10-CM | POA: Diagnosis not present

## 2016-11-19 DIAGNOSIS — K1321 Leukoplakia of oral mucosa, including tongue: Secondary | ICD-10-CM | POA: Diagnosis not present

## 2016-11-20 DIAGNOSIS — S22080A Wedge compression fracture of T11-T12 vertebra, initial encounter for closed fracture: Secondary | ICD-10-CM | POA: Diagnosis not present

## 2016-11-22 ENCOUNTER — Other Ambulatory Visit: Payer: PPO

## 2016-11-22 ENCOUNTER — Ambulatory Visit: Payer: PPO

## 2016-11-22 ENCOUNTER — Inpatient Hospital Stay (HOSPITAL_BASED_OUTPATIENT_CLINIC_OR_DEPARTMENT_OTHER): Payer: PPO | Admitting: Oncology

## 2016-11-22 ENCOUNTER — Ambulatory Visit: Payer: PPO | Admitting: Oncology

## 2016-11-22 ENCOUNTER — Inpatient Hospital Stay: Payer: PPO

## 2016-11-22 ENCOUNTER — Inpatient Hospital Stay: Payer: PPO | Attending: Oncology

## 2016-11-22 VITALS — BP 122/76 | HR 55 | Temp 98.1°F | Resp 18 | Wt 185.0 lb

## 2016-11-22 DIAGNOSIS — Z86010 Personal history of colonic polyps: Secondary | ICD-10-CM

## 2016-11-22 DIAGNOSIS — Z8719 Personal history of other diseases of the digestive system: Secondary | ICD-10-CM

## 2016-11-22 DIAGNOSIS — Z7901 Long term (current) use of anticoagulants: Secondary | ICD-10-CM

## 2016-11-22 DIAGNOSIS — K589 Irritable bowel syndrome without diarrhea: Secondary | ICD-10-CM | POA: Insufficient documentation

## 2016-11-22 DIAGNOSIS — E279 Disorder of adrenal gland, unspecified: Secondary | ICD-10-CM | POA: Diagnosis not present

## 2016-11-22 DIAGNOSIS — D472 Monoclonal gammopathy: Secondary | ICD-10-CM

## 2016-11-22 DIAGNOSIS — Z87891 Personal history of nicotine dependence: Secondary | ICD-10-CM | POA: Diagnosis not present

## 2016-11-22 DIAGNOSIS — M5136 Other intervertebral disc degeneration, lumbar region: Secondary | ICD-10-CM | POA: Diagnosis not present

## 2016-11-22 DIAGNOSIS — D509 Iron deficiency anemia, unspecified: Secondary | ICD-10-CM | POA: Insufficient documentation

## 2016-11-22 DIAGNOSIS — I2510 Atherosclerotic heart disease of native coronary artery without angina pectoris: Secondary | ICD-10-CM

## 2016-11-22 DIAGNOSIS — I10 Essential (primary) hypertension: Secondary | ICD-10-CM | POA: Diagnosis not present

## 2016-11-22 DIAGNOSIS — F329 Major depressive disorder, single episode, unspecified: Secondary | ICD-10-CM | POA: Diagnosis not present

## 2016-11-22 DIAGNOSIS — E875 Hyperkalemia: Secondary | ICD-10-CM | POA: Diagnosis not present

## 2016-11-22 DIAGNOSIS — Z79899 Other long term (current) drug therapy: Secondary | ICD-10-CM

## 2016-11-22 DIAGNOSIS — G4730 Sleep apnea, unspecified: Secondary | ICD-10-CM | POA: Insufficient documentation

## 2016-11-22 DIAGNOSIS — M549 Dorsalgia, unspecified: Secondary | ICD-10-CM

## 2016-11-22 DIAGNOSIS — K219 Gastro-esophageal reflux disease without esophagitis: Secondary | ICD-10-CM | POA: Insufficient documentation

## 2016-11-22 DIAGNOSIS — J449 Chronic obstructive pulmonary disease, unspecified: Secondary | ICD-10-CM | POA: Insufficient documentation

## 2016-11-22 DIAGNOSIS — E7800 Pure hypercholesterolemia, unspecified: Secondary | ICD-10-CM | POA: Diagnosis not present

## 2016-11-22 DIAGNOSIS — R0602 Shortness of breath: Secondary | ICD-10-CM

## 2016-11-22 LAB — CBC WITH DIFFERENTIAL/PLATELET
Basophils Absolute: 0 10*3/uL (ref 0–0.1)
Basophils Relative: 1 %
Eosinophils Absolute: 0 10*3/uL (ref 0–0.7)
Eosinophils Relative: 0 %
HEMATOCRIT: 36.8 % — AB (ref 40.0–52.0)
HEMOGLOBIN: 12.7 g/dL — AB (ref 13.0–18.0)
LYMPHS PCT: 26 %
Lymphs Abs: 1.7 10*3/uL (ref 1.0–3.6)
MCH: 34.8 pg — AB (ref 26.0–34.0)
MCHC: 34.4 g/dL (ref 32.0–36.0)
MCV: 101.1 fL — ABNORMAL HIGH (ref 80.0–100.0)
MONO ABS: 0.7 10*3/uL (ref 0.2–1.0)
MONOS PCT: 11 %
NEUTROS ABS: 4 10*3/uL (ref 1.4–6.5)
Neutrophils Relative %: 62 %
Platelets: 378 10*3/uL (ref 150–440)
RBC: 3.64 MIL/uL — ABNORMAL LOW (ref 4.40–5.90)
RDW: 14.7 % — ABNORMAL HIGH (ref 11.5–14.5)
WBC: 6.4 10*3/uL (ref 3.8–10.6)

## 2016-11-22 LAB — IRON AND TIBC
IRON: 113 ug/dL (ref 45–182)
SATURATION RATIOS: 10 % — AB (ref 17.9–39.5)
TIBC: 1170 ug/dL — ABNORMAL HIGH (ref 250–450)
UIBC: 1057 ug/dL

## 2016-11-22 LAB — FERRITIN: FERRITIN: 28 ng/mL (ref 24–336)

## 2016-11-22 NOTE — Progress Notes (Signed)
[place]  [phone] [phone]  ID: Troy Lopez OB: [id number]  MR#: [id number]  JOI#:[id number]  Patient Care Team: Einar Pheasant, MD as PCP - General (Internal Medicine)  CHIEF COMPLAINT: MGUS, iron deficiency anemia  INTERVAL HISTORY: Patient returns to clinic today for repeat laboratory work and further evaluation. He continues to have significant back pain from a recent kyphoplasty.  He does not complain of any weakness or fatigue. He has no neurologic complaints. He denies any fevers or recent illnesses. He has a good appetite and denies weight loss. He has no chest pain or shortness of breath. He denies any nausea, vomiting, constipation, or diarrhea. He has no urinary complaints. Patient offers no further specific complaints today.  REVIEW OF SYSTEMS:   Review of Systems  Constitutional: Negative.  Negative for fever, malaise/fatigue and weight loss.  Respiratory: Negative.  Negative for cough and shortness of breath.   Cardiovascular: Negative.  Negative for chest pain and leg swelling.  Gastrointestinal: Negative.  Negative for abdominal pain, blood in stool, diarrhea and melena.  Musculoskeletal: Positive for back pain.  Neurological: Negative.  Negative for weakness.  Psychiatric/Behavioral: Negative.  The patient is not nervous/anxious.     As per HPI. Otherwise, a complete review of systems is negative.  PAST MEDICAL HISTORY: Past Medical History:  Diagnosis Date  . Adrenal gland anomaly    enlargement  . CAD (coronary artery disease)   . Carpal tunnel syndrome   . Colonic polyp   . COPD (chronic obstructive pulmonary disease) ([place])   . Degenerative disc disease, lumbar   . Depression   . Diverticulosis   . Dyspnea   . GERD (gastroesophageal reflux disease)   . Hypercholesterolemia   . Hyperkalemia   . Hypertension   . Irritable bowel syndrome   . Monoclonal gammopathy   . Neuropathy ([place])   . Personal  history of tobacco use, presenting hazards to health 08/17/2015  . Sleep apnea     PAST SURGICAL HISTORY: Past Surgical History:  Procedure Laterality Date  . BUNIONECTOMY  1989  . CARPAL TUNNEL RELEASE Left 2011   ulnar nerve sub muscular at elbow  . CHOLECYSTECTOMY  09/07  . ELECTROPHYSIOLOGIC STUDY N/A 11/15/2015   Procedure: CARDIOVERSION;  Surgeon: Isaias Cowman, MD;  Location: [facility] ORS;  Service: Cardiovascular;  Laterality: N/A;  . EYE SURGERY Bilateral 2010   cataract  . HEMORRHOID SURGERY    . KYPHOPLASTY N/A 11/04/2016   Procedure: KYPHOPLASTY T 12;  Surgeon: Hessie Knows, MD;  Location: [facility] ORS;  Service: Orthopedics;  Laterality: N/A;    FAMILY HISTORY Family History  Problem Relation Age of Onset  . Stroke Mother   . Heart disease Father     MI - 36   . Colon cancer Neg Hx   . Prostate cancer Neg Hx        ADVANCED DIRECTIVES:    HEALTH MAINTENANCE: Social History  Substance Use Topics  . Smoking status: Former Smoker    Quit date: 11/01/2015  . Smokeless tobacco: Never Used     Comment: about 2 cigarettes per day, trying to quit  . Alcohol use 25.2 oz/week    42 Cans of beer per week     Comment: occas     Colonoscopy:  PAP:  Bone density:  Lipid panel:  Allergies  Allergen Reactions  . Ivp Dye [Iodinated Diagnostic Agents]     Contraindication secondary to IGMgammopathy/Waldren's syndrome      Current Outpatient  Prescriptions  Medication Sig Dispense Refill  . acetaminophen (TYLENOL) 500 MG tablet Take 500-1,000 mg by mouth 3 (three) times daily as needed for moderate pain or headache.    . albuterol (PROVENTIL HFA;VENTOLIN HFA) 108 (90 Base) MCG/ACT inhaler Inhale 2 puffs into the lungs every 6 (six) hours as needed for wheezing or shortness of breath.    Marland Kitchen amLODipine (NORVASC) 5 MG tablet Take 1 tablet (5 mg total) by mouth daily. 90 tablet 0  . budesonide (ENTOCORT EC) 3 MG 24 hr capsule Take 9 mg by mouth daily as needed (colitis  flare).     Marland Kitchen ELIQUIS 5 MG TABS tablet Take 5 mg by mouth 2 (two) times daily.     Marland Kitchen FIBER PO Take 2 capsules by mouth daily.    . fluticasone (FLONASE) 50 MCG/ACT nasal spray Place 2 sprays into both nostrils daily. (Patient taking differently: Place 2 sprays into both nostrils at bedtime. ) 16 g 5  . fluticasone furoate-vilanterol (BREO ELLIPTA) 100-25 MCG/INH AEPB Inhale 1 puff into the lungs daily. 60 each 11  . furosemide (LASIX) 20 MG tablet Take 1 tablet (20 mg total) by mouth daily as needed. (Patient taking differently: Take 62ms once daily) 30 tablet 5  . gabapentin (NEURONTIN) 100 MG capsule Take two each morning, two each afternoon and three at bedtime (Patient taking differently: 3 (three) times daily as needed. Take two each morning, two each afternoon and three at bedtime as needed for foot pain) 210 capsule 0  . HYDROcodone-acetaminophen (NORCO/VICODIN) 5-325 MG tablet Take 2 tablets by mouth 2 (two) times daily as needed for moderate pain.     .Marland KitchenHYDROcodone-acetaminophen (NORCO/VICODIN) 5-325 MG tablet Take by mouth.    . levalbuterol (XOPENEX HFA) 45 MCG/ACT inhaler Inhale into the lungs every 4 (four) hours as needed for wheezing.    .Marland Kitchenlosartan (COZAAR) 100 MG tablet Take 1 tablet (100 mg total) by mouth daily. 90 tablet 0  . metoprolol (LOPRESSOR) 100 MG tablet Take 1 tablet (100 mg total) by mouth 2 (two) times daily. 60 tablet 3  . mupirocin ointment (BACTROBAN) 2 % Place 1 application into the nose 2 (two) times daily. (Patient taking differently: Apply 1 application topically daily as needed (bumps). ) 22 g 0  . pantoprazole (PROTONIX) 40 MG tablet Take 1 tablet (40 mg total) by mouth 2 (two) times daily. 180 tablet 0  . sertraline (ZOLOFT) 50 MG tablet Take 1 tablet (50 mg total) by mouth daily. 30 tablet 1  . sodium chloride (OCEAN) 0.65 % SOLN nasal spray Place 2 sprays into both nostrils as needed (dryness).    . traMADol (ULTRAM) 50 MG tablet Take 1 tablet (50 mg total)  by mouth every 6 (six) hours as needed for moderate pain. 30 tablet 0  . vitamin B-12 (CYANOCOBALAMIN) 1000 MCG tablet Take 1,000 mcg by mouth daily.     . isosorbide mononitrate (IMDUR) 30 MG 24 hr tablet Take 30 mg by mouth daily.      No current facility-administered medications for this visit.     OBJECTIVE: Vitals:   11/22/16 1359  BP: 122/76  Pulse: (!) 55  Resp: 18  Temp: 98.1 F (36.7 C)     Body mass index is 25.8 kg/m.    ECOG FS:0 - Asymptomatic  General: Well-developed, well-nourished, no acute distress. Eyes: Pink conjunctiva, anicteric sclera. Lungs: Clear to auscultation bilaterally. Heart: Regular rate and rhythm. No rubs, murmurs, or gallops. Abdomen: Soft, nontender, nondistended. No  organomegaly noted, normoactive bowel sounds. Musculoskeletal: No edema, cyanosis, or clubbing. Neuro: Alert, answering all questions appropriately. Cranial nerves grossly intact. Skin: No rashes or petechiae noted. Psych: Normal affect.   LAB RESULTS:  Lab Results  Component Value Date   NA 128 (L) 11/04/2016   K 4.9 11/04/2016   CL 94 (L) 09/18/2016   CO2 24 09/18/2016   GLUCOSE 100 (H) 11/04/2016   BUN 11 09/18/2016   CREATININE 1.02 09/18/2016   CALCIUM 9.2 09/18/2016   PROT 8.5 (H) 02/14/2016   ALBUMIN 3.4 (L) 02/14/2016   AST 14 02/14/2016   ALT 10 02/14/2016   ALKPHOS 79 02/14/2016   BILITOT 0.7 02/14/2016   GFRNONAA >60 09/18/2016   GFRAA >60 09/18/2016    Lab Results  Component Value Date   WBC 6.4 11/22/2016   NEUTROABS 4.0 11/22/2016   HGB 12.7 (L) 11/22/2016   HCT 36.8 (L) 11/22/2016   MCV 101.1 (H) 11/22/2016   PLT 378 11/22/2016   Lab Results  Component Value Date   IRON 113 11/22/2016   TIBC 1,170 (H) 11/22/2016   IRONPCTSAT 10 (L) 11/22/2016    Lab Results  Component Value Date   FERRITIN 28 11/22/2016   Lab Results  Component Value Date   TOTALPROTELP 8.6 (H) 09/18/2016   ALBUMINELP 3.7 09/18/2016   A1GS 0.2 09/18/2016   A2GS  0.6 09/18/2016   BETS 1.5 (H) 09/18/2016   BETA2SER 0.2 02/14/2016   GAMS 2.6 (H) 09/18/2016   MSPIKE 2.3 (H) 09/18/2016   SPEI Comment 09/18/2016    STUDIES: Dg Thoracic Spine 2 View  Result Date: 11/04/2016 CLINICAL DATA:  76 year old male undergoing T12 kyphoplasty. EXAM: DG C-ARM 61-120 MIN; THORACIC SPINE 2 VIEWS COMPARISON:  Lumbar MRI 10/23/2016 FINDINGS: Two intraoperative fluoroscopic spot views of the thoracolumbar junction in the AP and lateral projection. Bone cement in place within the compressed T12 vertebral body. The adjacent levels appear stable. No adverse features identified. FLUOROSCOPY TIME:  2 minutes and 14 seconds IMPRESSION: Sequelae of T12 augmentation with no adverse features identified. Electronically Signed   By: Genevie Ann M.D.   On: 11/04/2016 15:51   Dg C-arm 1-60 Min  Result Date: 11/04/2016 CLINICAL DATA:  76 year old male undergoing T12 kyphoplasty. EXAM: DG C-ARM 61-120 MIN; THORACIC SPINE 2 VIEWS COMPARISON:  Lumbar MRI 10/23/2016 FINDINGS: Two intraoperative fluoroscopic spot views of the thoracolumbar junction in the AP and lateral projection. Bone cement in place within the compressed T12 vertebral body. The adjacent levels appear stable. No adverse features identified. FLUOROSCOPY TIME:  2 minutes and 14 seconds IMPRESSION: Sequelae of T12 augmentation with no adverse features identified. Electronically Signed   By: Genevie Ann M.D.   On: 11/04/2016 15:51    ASSESSMENT: MGUS, iron deficiency anemia.  PLAN:    1. MGUS:  Patient's most recent M spike from January is 2.3. He now has iron deficiency anemia, but no other evidence of endorgan damage. Previously, full workup was negative. 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 Given patient's continued discomfort from his recent surgery, he does not wish to undergo another procedure at this time. He will  return to clinic in 1 month with repeat laboratory work, further evaluation, and further discussion of doing a bone marrow biopsy.   2. Iron deficiency anemia: Patient's hemoglobin is essentially normal, although he continues to have decreased iron stores. Previously patient reported a  full GI workup that confirmed underlying colitis that likely is contributing to his iron deficiency. He declined IV iron today. His most recent infusion was in October 2017.  Approximately 30 minutes was spent in discussion of which greater than 50% was consultation.  Patient expressed understanding and was in agreement with this plan. He also understands that He can call clinic at any time with any questions, concerns, or complaints.    Lloyd Huger, MD   11/29/2016 12:17 AM   November 04, 2016: Patient underwent kyphoplasty of T12 vertebrae and surgical pathology revealed a plasma cell population concerning for underlying lymphoproliferative disorder such as myeloma. Plasma cell proportion could not be determined secondary to changes from patient's compression fracture.

## 2016-11-25 ENCOUNTER — Ambulatory Visit (INDEPENDENT_AMBULATORY_CARE_PROVIDER_SITE_OTHER): Payer: PPO | Admitting: Internal Medicine

## 2016-11-25 ENCOUNTER — Encounter: Payer: Self-pay | Admitting: Internal Medicine

## 2016-11-25 VITALS — BP 108/54 | HR 61 | Temp 97.6°F | Resp 12 | Ht 71.0 in | Wt 185.6 lb

## 2016-11-25 DIAGNOSIS — K52831 Collagenous colitis: Secondary | ICD-10-CM | POA: Diagnosis not present

## 2016-11-25 DIAGNOSIS — J410 Simple chronic bronchitis: Secondary | ICD-10-CM | POA: Diagnosis not present

## 2016-11-25 DIAGNOSIS — I2510 Atherosclerotic heart disease of native coronary artery without angina pectoris: Secondary | ICD-10-CM

## 2016-11-25 DIAGNOSIS — R55 Syncope and collapse: Secondary | ICD-10-CM | POA: Diagnosis not present

## 2016-11-25 DIAGNOSIS — Z9889 Other specified postprocedural states: Secondary | ICD-10-CM | POA: Diagnosis not present

## 2016-11-25 DIAGNOSIS — Z8781 Personal history of (healed) traumatic fracture: Secondary | ICD-10-CM

## 2016-11-25 DIAGNOSIS — D472 Monoclonal gammopathy: Secondary | ICD-10-CM

## 2016-11-25 DIAGNOSIS — G4733 Obstructive sleep apnea (adult) (pediatric): Secondary | ICD-10-CM | POA: Diagnosis not present

## 2016-11-25 DIAGNOSIS — G4730 Sleep apnea, unspecified: Secondary | ICD-10-CM

## 2016-11-25 DIAGNOSIS — J449 Chronic obstructive pulmonary disease, unspecified: Secondary | ICD-10-CM

## 2016-11-25 DIAGNOSIS — F329 Major depressive disorder, single episode, unspecified: Secondary | ICD-10-CM

## 2016-11-25 DIAGNOSIS — I10 Essential (primary) hypertension: Secondary | ICD-10-CM

## 2016-11-25 DIAGNOSIS — F32A Depression, unspecified: Secondary | ICD-10-CM

## 2016-11-25 DIAGNOSIS — Z720 Tobacco use: Secondary | ICD-10-CM | POA: Diagnosis not present

## 2016-11-25 DIAGNOSIS — R0789 Other chest pain: Secondary | ICD-10-CM | POA: Diagnosis not present

## 2016-11-25 DIAGNOSIS — E7800 Pure hypercholesterolemia, unspecified: Secondary | ICD-10-CM | POA: Diagnosis not present

## 2016-11-25 LAB — POCT CBG (FASTING - GLUCOSE)-MANUAL ENTRY: Glucose Fasting, POC: 116 mg/dL — AB (ref 70–99)

## 2016-11-25 NOTE — Progress Notes (Signed)
Pre-visit discussion using our clinic review tool. No additional management support is needed unless otherwise documented below in the visit note.  

## 2016-11-25 NOTE — Progress Notes (Signed)
Patient ID: Troy Lopez, male   DOB: February 09, 1941, 76 y.o.   MRN: [id number]   Subjective:    Patient ID: Troy Lopez, male    DOB: 10-24-1940, 76 y.o.   MRN: [id number]  HPI  Patient here for a scheduled follow up.  States that over the last 2 months has noticed some intermittent episodes of light headedness.  May occur once per week.  No room spinning.  Blood pressure low.  Has noticed some nausea and emesis with one episode.  Fell forward.  Questionable loc with this episode.  He is eating.  Still with intermittent GI symptoms and flares.  No chest pain.  Taking eliquis.  No bleeding.  On clonidine.  Some days does not take if blood pressure low.     Past Medical History:  Diagnosis Date  . Adrenal gland anomaly    enlargement  . CAD (coronary artery disease)   . Carpal tunnel syndrome   . Colonic polyp   . COPD (chronic obstructive pulmonary disease) ([place])   . Degenerative disc disease, lumbar   . Depression   . Diverticulosis   . Dyspnea   . GERD (gastroesophageal reflux disease)   . Hypercholesterolemia   . Hyperkalemia   . Hypertension   . Irritable bowel syndrome   . Monoclonal gammopathy   . Neuropathy   . Personal history of tobacco use, presenting hazards to health 08/17/2015  . Sleep apnea    Past Surgical History:  Procedure Laterality Date  . BUNIONECTOMY  1989  . CARPAL TUNNEL RELEASE Left 2011   ulnar nerve sub muscular at elbow  . CHOLECYSTECTOMY  09/07  . ELECTROPHYSIOLOGIC STUDY N/A 11/15/2015   Procedure: CARDIOVERSION;  Surgeon: Isaias Cowman, MD;  Location: [facility] ORS;  Service: Cardiovascular;  Laterality: N/A;  . EYE SURGERY Bilateral 2010   cataract  . HEMORRHOID SURGERY    . KYPHOPLASTY N/A 11/04/2016   Procedure: KYPHOPLASTY T 12;  Surgeon: Hessie Knows, MD;  Location: [facility] ORS;  Service: Orthopedics;  Laterality: N/A;   Family History  Problem Relation Age of Onset  . Stroke Mother   . Heart disease Father     MI - 65   . Colon  cancer Neg Hx   . Prostate cancer Neg Hx    Social History   Social History  . Marital status: Married    Spouse name: N/A  . Number of children: 3  . Years of education: N/A   Social History Main Topics  . Smoking status: Former Smoker    Quit date: 11/01/2015  . Smokeless tobacco: Never Used     Comment: about 2 cigarettes per day, trying to quit  . Alcohol use 25.2 oz/week    42 Cans of beer per week     Comment: occas  . Drug use: No  . Sexual activity: Not Asked   Other Topics Concern  . None   Social History Narrative  . None    Outpatient Encounter Prescriptions as of 11/25/2016  Medication Sig  . acetaminophen (TYLENOL) 500 MG tablet Take 500-1,000 mg by mouth 3 (three) times daily as needed for moderate pain or headache.  . albuterol (PROVENTIL HFA;VENTOLIN HFA) 108 (90 Base) MCG/ACT inhaler Inhale 2 puffs into the lungs every 6 (six) hours as needed for wheezing or shortness of breath.  Marland Kitchen amLODipine (NORVASC) 5 MG tablet Take 1 tablet (5 mg total) by mouth daily.  . budesonide (ENTOCORT EC) 3 MG 24 hr capsule Take 9  mg by mouth daily as needed (colitis flare).   Marland Kitchen ELIQUIS 5 MG TABS tablet Take 5 mg by mouth 2 (two) times daily.   Marland Kitchen FIBER PO Take 2 capsules by mouth daily.  . fluticasone furoate-vilanterol (BREO ELLIPTA) 100-25 MCG/INH AEPB Inhale 1 puff into the lungs daily.  . furosemide (LASIX) 20 MG tablet Take 1 tablet (20 mg total) by mouth daily as needed. (Patient taking differently: Take 20mg s once daily)  . gabapentin (NEURONTIN) 100 MG capsule Take two each morning, two each afternoon and three at bedtime (Patient taking differently: 3 (three) times daily as needed. Take two each morning, two each afternoon and three at bedtime as needed for foot pain)  . HYDROcodone-acetaminophen (NORCO/VICODIN) 5-325 MG tablet Take 2 tablets by mouth 2 (two) times daily as needed for moderate pain.   Marland Kitchen HYDROcodone-acetaminophen (NORCO/VICODIN) 5-325 MG tablet Take by mouth.   . levalbuterol (XOPENEX HFA) 45 MCG/ACT inhaler Inhale into the lungs every 4 (four) hours as needed for wheezing.  Marland Kitchen losartan (COZAAR) 100 MG tablet Take 1 tablet (100 mg total) by mouth daily.  . metoprolol (LOPRESSOR) 100 MG tablet Take 1 tablet (100 mg total) by mouth 2 (two) times daily.  . mupirocin ointment (BACTROBAN) 2 % Place 1 application into the nose 2 (two) times daily. (Patient taking differently: Apply 1 application topically daily as needed (bumps). )  . pantoprazole (PROTONIX) 40 MG tablet Take 1 tablet (40 mg total) by mouth 2 (two) times daily.  . sertraline (ZOLOFT) 50 MG tablet Take 1 tablet (50 mg total) by mouth daily.  . sodium chloride (OCEAN) 0.65 % SOLN nasal spray Place 2 sprays into both nostrils as needed (dryness).  . traMADol (ULTRAM) 50 MG tablet Take 1 tablet (50 mg total) by mouth every 6 (six) hours as needed for moderate pain.  . vitamin B-12 (CYANOCOBALAMIN) 1000 MCG tablet Take 1,000 mcg by mouth daily.   . [DISCONTINUED] fluticasone (FLONASE) 50 MCG/ACT nasal spray Place 2 sprays into both nostrils daily. (Patient taking differently: Place 2 sprays into both nostrils at bedtime. )  . isosorbide mononitrate (IMDUR) 30 MG 24 hr tablet Take 30 mg by mouth daily.   . [DISCONTINUED] chlorpheniramine-HYDROcodone (TUSSIONEX PENNKINETIC ER) 10-8 MG/5ML SUER Take 5 mLs by mouth every 12 (twelve) hours as needed. (Patient not taking: Reported on 11/22/2016)  . [DISCONTINUED] doxycycline (VIBRA-TABS) 100 MG tablet Take 1 tablet (100 mg total) by mouth 2 (two) times daily. (Patient not taking: Reported on 09/18/2016)  . [DISCONTINUED] oxycodone (OXY-IR) 5 MG capsule Take 5 mg by mouth every 4 (four) hours as needed.   No facility-administered encounter medications on file as of 11/25/2016.     Review of Systems  Constitutional: Negative for appetite change and unexpected weight change.  HENT: Negative for congestion and sinus pressure.   Eyes: Negative for pain and  visual disturbance.  Respiratory: Negative for cough, chest tightness and shortness of breath.   Cardiovascular: Negative for chest pain, palpitations and leg swelling.  Gastrointestinal: Negative for abdominal pain.       Intermittent GI flares.    Genitourinary: Negative for difficulty urinating and dysuria.  Musculoskeletal: Positive for back pain. Negative for joint swelling.  Skin: Negative for color change and rash.  Neurological: Positive for light-headedness. Negative for headaches.  Hematological: Negative for adenopathy. Does not bruise/bleed easily.  Psychiatric/Behavioral: Negative for agitation and dysphoric mood.       Objective:    Physical Exam  Constitutional: He appears well-developed  and well-nourished. No distress.  HENT:  Nose: Nose normal.  Mouth/Throat: Oropharynx is clear and moist.  Neck: Neck supple. No thyromegaly present.  Cardiovascular: Normal rate and regular rhythm.   Pulmonary/Chest: Effort normal and breath sounds normal. No respiratory distress.  Abdominal: Soft. Bowel sounds are normal. There is no tenderness.  Musculoskeletal: He exhibits no edema or tenderness.  Lymphadenopathy:    He has no cervical adenopathy.  Skin: No rash noted. No erythema.  Psychiatric: He has a normal mood and affect. His behavior is normal.    BP (!) 108/54 (BP Location: Left Arm, Patient Position: Sitting, Cuff Size: Large)   Pulse 61   Temp 97.6 F (36.4 C) (Oral)   Resp 12   Ht 5\' 11"  (1.803 m)   Wt 185 lb 9.6 oz (84.2 kg)   SpO2 95%   BMI 25.89 kg/m  Wt Readings from Last 3 Encounters:  11/25/16 185 lb 9.6 oz (84.2 kg)  11/22/16 184 lb 15.5 oz (83.9 kg)  11/01/16 188 lb (85.3 kg)     Lab Results  Component Value Date   WBC 6.4 11/22/2016   HGB 12.7 (L) 11/22/2016   HCT 36.8 (L) 11/22/2016   PLT 378 11/22/2016   GLUCOSE 100 (H) 11/04/2016   CHOL 134 02/14/2016   TRIG 42.0 02/14/2016   HDL 74.80 02/14/2016   LDLCALC 51 02/14/2016   ALT 10  02/14/2016   AST 14 02/14/2016   NA 128 (L) 11/04/2016   K 4.9 11/04/2016   CL 94 (L) 09/18/2016   CREATININE 1.02 09/18/2016   BUN 11 09/18/2016   CO2 24 09/18/2016   TSH 1.75 06/12/2015   PSA 1.33 02/14/2016   HGBA1C 5.7 03/04/2016       Assessment & Plan:   Problem List Items Addressed This Visit    CAD (coronary artery disease)    Followed by cardiology.  Given intermittent light headedness and questionable syncopal episode, EKG obtained and revealed SB with ventricular rate of 58.  Stop clonidine.  Have cardiology evaluated.  May need holter and further cardiac testing.        Collagenous colitis    Persistent intermittent flares.  Overall stable.  Has been worked up by GI.       Depression    Off zoloft.  Stable.  Follow.       History of compression fracture of spine    s/p kyphoplasty.  Followed by ortho.  Referred to endocrinology for osteoporosis management per ortho.        Hypertension    Blood pressure low today.  Stop clonidine.  Have cardiology evaluate as outlined.        MGUS (monoclonal gammopathy of unknown significance)    Has been followed by hematology.  Saw Dr Grayland Ormond 11/22/16.  Stable M spike 2.3.        Moderate COPD (chronic obstructive pulmonary disease) ([facility])    Breathing stable.  Continue f/u with pulmonary.        Sleep apnea    CPAP.        Other Visit Diagnoses    Syncope, unspecified syncope type    -  Primary   Relevant Orders   EKG 12-Lead (Completed)   POCT CBG (Fasting - Glucose) (Completed)       Einar Pheasant, MD

## 2016-12-02 DIAGNOSIS — R001 Bradycardia, unspecified: Secondary | ICD-10-CM | POA: Diagnosis not present

## 2016-12-03 DIAGNOSIS — Z720 Tobacco use: Secondary | ICD-10-CM | POA: Diagnosis not present

## 2016-12-03 DIAGNOSIS — R0789 Other chest pain: Secondary | ICD-10-CM | POA: Diagnosis not present

## 2016-12-03 DIAGNOSIS — E7800 Pure hypercholesterolemia, unspecified: Secondary | ICD-10-CM | POA: Diagnosis not present

## 2016-12-03 DIAGNOSIS — J410 Simple chronic bronchitis: Secondary | ICD-10-CM | POA: Diagnosis not present

## 2016-12-03 DIAGNOSIS — Z9889 Other specified postprocedural states: Secondary | ICD-10-CM | POA: Diagnosis not present

## 2016-12-03 DIAGNOSIS — I10 Essential (primary) hypertension: Secondary | ICD-10-CM | POA: Diagnosis not present

## 2016-12-03 DIAGNOSIS — G4733 Obstructive sleep apnea (adult) (pediatric): Secondary | ICD-10-CM | POA: Diagnosis not present

## 2016-12-06 ENCOUNTER — Other Ambulatory Visit: Payer: Self-pay | Admitting: Internal Medicine

## 2016-12-08 ENCOUNTER — Encounter: Payer: Self-pay | Admitting: Internal Medicine

## 2016-12-08 DIAGNOSIS — Z8781 Personal history of (healed) traumatic fracture: Secondary | ICD-10-CM | POA: Insufficient documentation

## 2016-12-08 NOTE — Assessment & Plan Note (Signed)
Off zoloft.  Stable.  Follow.

## 2016-12-08 NOTE — Assessment & Plan Note (Signed)
Blood pressure low today.  Stop clonidine.  Have cardiology evaluate as outlined.

## 2016-12-08 NOTE — Assessment & Plan Note (Signed)
Breathing stable.  Continue f/u with pulmonary.   

## 2016-12-08 NOTE — Assessment & Plan Note (Signed)
CPAP.  

## 2016-12-08 NOTE — Assessment & Plan Note (Addendum)
Has been followed by hematology.  Saw Dr Grayland Ormond 11/22/16.  Stable M spike 2.3.

## 2016-12-08 NOTE — Assessment & Plan Note (Signed)
Persistent intermittent flares.  Overall stable.  Has been worked up by GI.

## 2016-12-08 NOTE — Assessment & Plan Note (Signed)
Followed by cardiology.  Given intermittent light headedness and questionable syncopal episode, EKG obtained and revealed SB with ventricular rate of 58.  Stop clonidine.  Have cardiology evaluated.  May need holter and further cardiac testing.

## 2016-12-08 NOTE — Assessment & Plan Note (Signed)
s/p kyphoplasty.  Followed by ortho.  Referred to endocrinology for osteoporosis management per ortho.

## 2016-12-11 ENCOUNTER — Other Ambulatory Visit: Payer: Self-pay

## 2016-12-11 ENCOUNTER — Ambulatory Visit: Payer: Self-pay | Admitting: Oncology

## 2016-12-11 ENCOUNTER — Ambulatory Visit: Payer: Self-pay

## 2016-12-13 ENCOUNTER — Ambulatory Visit
Admission: RE | Admit: 2016-12-13 | Discharge: 2016-12-13 | Disposition: A | Discharge status: Home / Self Care | Payer: PPO | Source: Ambulatory Visit | Attending: Pulmonary Disease | Admitting: Pulmonary Disease

## 2016-12-13 ENCOUNTER — Encounter: Payer: Self-pay | Admitting: Pulmonary Disease

## 2016-12-13 ENCOUNTER — Ambulatory Visit (INDEPENDENT_AMBULATORY_CARE_PROVIDER_SITE_OTHER): Payer: PPO | Admitting: Pulmonary Disease

## 2016-12-13 VITALS — BP 128/70 | HR 63 | Resp 16 | Ht 71.0 in | Wt 190.0 lb

## 2016-12-13 DIAGNOSIS — Z87891 Personal history of nicotine dependence: Secondary | ICD-10-CM | POA: Insufficient documentation

## 2016-12-13 DIAGNOSIS — J449 Chronic obstructive pulmonary disease, unspecified: Secondary | ICD-10-CM

## 2016-12-13 DIAGNOSIS — I700 Atherosclerosis of aorta: Secondary | ICD-10-CM | POA: Diagnosis not present

## 2016-12-13 DIAGNOSIS — R0609 Other forms of dyspnea: Secondary | ICD-10-CM

## 2016-12-13 NOTE — Patient Instructions (Signed)
Continue Breo inhaler  Continue albuterol as needed Congratulations on smoking cessation - great accomplishment!! Chest Xray today Follow up in 6 months

## 2016-12-13 NOTE — Telephone Encounter (Signed)
Left pt message asking to call Allison back directly at [phone] to schedule AWV. Thanks! °

## 2016-12-13 NOTE — Progress Notes (Signed)
PULMONARY OFFICE FOLLOW UP NOTE  Requesting MD/Service: Einar Pheasant, MD Date of initial consultation: 09/18/15 Reason for consultation: Dyspnea  PT PROFILE: 76 y.o. M smoker underwent initial evaluation for progressive dyspnea on 09/18/15 with finding of cardiac arrhythmia - probable atrial flutter with variable conduction and frequent PVCs. Initial impression: Likely COPD. Acute worsening of respiratory symptoms possibly due to cardiac arrhythmia. Case discussed with Dr Saralyn Pilar. Trial of Incruse initiated  Events/Data: NM stress 08/17/15: 1. Equivocal ETT. 2. Normal left ventricular function. 3. Normal wall motion. 4. No evidence for scar or ischemia TTE 08/02/15: LVEF 50%, modertate LVH. Mod MR. RVSP est 57 mmHg. LA moderately dilated 09/22/15 PFTs: mild-mod obstruction. Normal TLC. Severe reduction DLCO 10/09/15 ambulatory oximetry: no desaturation.  11/15/15: Successful DCCV ([facility])  SUBJ: No new major complaints. No events. Remains moderately limited by DOE and fatigue. Remains on Breo inhaler. Rarely uses albuterol inhaler. Has not smoked since last visit except a 2 day relapse.    Vitals:   12/13/16 0929  BP: 128/70  Pulse: 63  Resp: 16  SpO2: 95%  Weight: 190 lb (86.2 kg)  Height: 5\' 11"  (1.803 m)     EXAM:  Gen: No distress HEENT: NCAT, oropharynx normal Neck: Supple without LAN, thyromegaly, JVD Lungs: breath sounds minimally, no wheezes Cardiovascular: RRR, no murmurs noted Abdomen: Soft, nontender, normal BS Ext: without clubbing, cyanosis, edema Neuro: grossly intact  DATA:  No new film  IMPRESSION:   1) Former smoker - presently abstinent. Using NRT (lozenges) occasionally 2) COPD, predominantly emphysema - mild-mod obstruction  3) Exertional dyspnea - well explained by COPD  PLAN:  Congratulated him on success at smoking cessation. Encouraged continue effort. Discussed risk of relapse and stratergies to prevent such Continue Breo inhaler and  PRN albuterol CXR today - routine Follow up in 6 months or PRN.   Merton Border, MD PCCM service Mobile [phone] Pager [phone] 12/13/2016 10:08 AM

## 2016-12-18 DIAGNOSIS — M4696 Unspecified inflammatory spondylopathy, lumbar region: Secondary | ICD-10-CM | POA: Diagnosis not present

## 2016-12-18 DIAGNOSIS — Z9889 Other specified postprocedural states: Secondary | ICD-10-CM | POA: Diagnosis not present

## 2016-12-18 DIAGNOSIS — S22080A Wedge compression fracture of T11-T12 vertebra, initial encounter for closed fracture: Secondary | ICD-10-CM | POA: Diagnosis not present

## 2016-12-25 DIAGNOSIS — Z86018 Personal history of other benign neoplasm: Secondary | ICD-10-CM | POA: Diagnosis not present

## 2016-12-25 DIAGNOSIS — L281 Prurigo nodularis: Secondary | ICD-10-CM | POA: Diagnosis not present

## 2016-12-25 DIAGNOSIS — Z85828 Personal history of other malignant neoplasm of skin: Secondary | ICD-10-CM | POA: Diagnosis not present

## 2016-12-25 DIAGNOSIS — Z872 Personal history of diseases of the skin and subcutaneous tissue: Secondary | ICD-10-CM | POA: Diagnosis not present

## 2016-12-26 ENCOUNTER — Other Ambulatory Visit: Payer: Self-pay | Admitting: Internal Medicine

## 2016-12-27 ENCOUNTER — Inpatient Hospital Stay: Payer: PPO | Attending: Oncology

## 2016-12-27 DIAGNOSIS — Z86010 Personal history of colonic polyps: Secondary | ICD-10-CM | POA: Diagnosis not present

## 2016-12-27 DIAGNOSIS — M5136 Other intervertebral disc degeneration, lumbar region: Secondary | ICD-10-CM | POA: Diagnosis not present

## 2016-12-27 DIAGNOSIS — F329 Major depressive disorder, single episode, unspecified: Secondary | ICD-10-CM | POA: Insufficient documentation

## 2016-12-27 DIAGNOSIS — I10 Essential (primary) hypertension: Secondary | ICD-10-CM | POA: Diagnosis not present

## 2016-12-27 DIAGNOSIS — K219 Gastro-esophageal reflux disease without esophagitis: Secondary | ICD-10-CM | POA: Insufficient documentation

## 2016-12-27 DIAGNOSIS — R0602 Shortness of breath: Secondary | ICD-10-CM | POA: Insufficient documentation

## 2016-12-27 DIAGNOSIS — D472 Monoclonal gammopathy: Secondary | ICD-10-CM | POA: Insufficient documentation

## 2016-12-27 DIAGNOSIS — I700 Atherosclerosis of aorta: Secondary | ICD-10-CM | POA: Insufficient documentation

## 2016-12-27 DIAGNOSIS — G629 Polyneuropathy, unspecified: Secondary | ICD-10-CM | POA: Diagnosis not present

## 2016-12-27 DIAGNOSIS — E871 Hypo-osmolality and hyponatremia: Secondary | ICD-10-CM | POA: Insufficient documentation

## 2016-12-27 DIAGNOSIS — Z79899 Other long term (current) drug therapy: Secondary | ICD-10-CM | POA: Diagnosis not present

## 2016-12-27 DIAGNOSIS — I2510 Atherosclerotic heart disease of native coronary artery without angina pectoris: Secondary | ICD-10-CM | POA: Diagnosis not present

## 2016-12-27 DIAGNOSIS — G4730 Sleep apnea, unspecified: Secondary | ICD-10-CM | POA: Insufficient documentation

## 2016-12-27 DIAGNOSIS — Z87891 Personal history of nicotine dependence: Secondary | ICD-10-CM | POA: Insufficient documentation

## 2016-12-27 DIAGNOSIS — E279 Disorder of adrenal gland, unspecified: Secondary | ICD-10-CM | POA: Insufficient documentation

## 2016-12-27 DIAGNOSIS — Z7901 Long term (current) use of anticoagulants: Secondary | ICD-10-CM | POA: Diagnosis not present

## 2016-12-27 DIAGNOSIS — Z8719 Personal history of other diseases of the digestive system: Secondary | ICD-10-CM | POA: Insufficient documentation

## 2016-12-27 DIAGNOSIS — E7800 Pure hypercholesterolemia, unspecified: Secondary | ICD-10-CM | POA: Diagnosis not present

## 2016-12-27 DIAGNOSIS — D509 Iron deficiency anemia, unspecified: Secondary | ICD-10-CM | POA: Diagnosis not present

## 2016-12-27 DIAGNOSIS — J449 Chronic obstructive pulmonary disease, unspecified: Secondary | ICD-10-CM | POA: Diagnosis not present

## 2016-12-27 DIAGNOSIS — E875 Hyperkalemia: Secondary | ICD-10-CM | POA: Insufficient documentation

## 2016-12-27 DIAGNOSIS — K589 Irritable bowel syndrome without diarrhea: Secondary | ICD-10-CM | POA: Diagnosis not present

## 2016-12-27 LAB — CBC WITH DIFFERENTIAL/PLATELET
BASOS ABS: 0 10*3/uL (ref 0–0.1)
Basophils Relative: 1 %
Eosinophils Absolute: 0 10*3/uL (ref 0–0.7)
Eosinophils Relative: 0 %
HCT: 33.6 % — ABNORMAL LOW (ref 40.0–52.0)
HEMOGLOBIN: 11.5 g/dL — AB (ref 13.0–18.0)
LYMPHS ABS: 1.3 10*3/uL (ref 1.0–3.6)
LYMPHS PCT: 18 %
MCH: 33.9 pg (ref 26.0–34.0)
MCHC: 34.1 g/dL (ref 32.0–36.0)
MCV: 99.5 fL (ref 80.0–100.0)
Monocytes Absolute: 0.6 10*3/uL (ref 0.2–1.0)
Monocytes Relative: 8 %
NEUTROS ABS: 5.1 10*3/uL (ref 1.4–6.5)
NEUTROS PCT: 73 %
Platelets: 416 10*3/uL (ref 150–440)
RBC: 3.38 MIL/uL — AB (ref 4.40–5.90)
RDW: 15 % — ABNORMAL HIGH (ref 11.5–14.5)
WBC: 7 10*3/uL (ref 3.8–10.6)

## 2016-12-27 LAB — COMPREHENSIVE METABOLIC PANEL
ALK PHOS: 69 U/L (ref 38–126)
ALT: 14 U/L — AB (ref 17–63)
AST: 19 U/L (ref 15–41)
Albumin: 3.4 g/dL — ABNORMAL LOW (ref 3.5–5.0)
Anion gap: 6 (ref 5–15)
BUN: 16 mg/dL (ref 6–20)
CALCIUM: 9.1 mg/dL (ref 8.9–10.3)
CHLORIDE: 91 mmol/L — AB (ref 101–111)
CO2: 24 mmol/L (ref 22–32)
CREATININE: 1.02 mg/dL (ref 0.61–1.24)
Glucose, Bld: 158 mg/dL — ABNORMAL HIGH (ref 65–99)
Potassium: 4.5 mmol/L (ref 3.5–5.1)
Sodium: 121 mmol/L — ABNORMAL LOW (ref 135–145)
Total Bilirubin: 0.3 mg/dL (ref 0.3–1.2)
Total Protein: 8.3 g/dL — ABNORMAL HIGH (ref 6.5–8.1)

## 2016-12-28 LAB — IGG, IGA, IGM
IGA: 54 mg/dL — AB (ref 61–437)
IGG (IMMUNOGLOBIN G), SERUM: 459 mg/dL — AB (ref 700–1600)
IgM, Serum: 3126 mg/dL — ABNORMAL HIGH (ref 15–143)

## 2016-12-30 LAB — PROTEIN ELECTROPHORESIS, SERUM
A/G Ratio: 0.6 — ABNORMAL LOW (ref 0.7–1.7)
ALPHA-2-GLOBULIN: 0.6 g/dL (ref 0.4–1.0)
Albumin ELP: 3.1 g/dL (ref 2.9–4.4)
Alpha-1-Globulin: 0.3 g/dL (ref 0.0–0.4)
BETA GLOBULIN: 1.3 g/dL (ref 0.7–1.3)
GAMMA GLOBULIN: 2.6 g/dL — AB (ref 0.4–1.8)
Globulin, Total: 4.8 g/dL — ABNORMAL HIGH (ref 2.2–3.9)
M-SPIKE, %: 1.1 g/dL — AB
Total Protein ELP: 7.9 g/dL (ref 6.0–8.5)

## 2016-12-30 LAB — KAPPA/LAMBDA LIGHT CHAINS
KAPPA FREE LGHT CHN: 67.6 mg/L — AB (ref 3.3–19.4)
KAPPA, LAMDA LIGHT CHAIN RATIO: 4.93 — AB (ref 0.26–1.65)
LAMDA FREE LIGHT CHAINS: 13.7 mg/L (ref 5.7–26.3)

## 2017-01-01 NOTE — Progress Notes (Signed)
[place]  Telephone:([phone]) [phone] Fax:(336) [phone]  ID: Troy Lopez OB: 12/27/1940  MR#: [id number]  [facility]#:[id number]  Patient Care Team: Einar Pheasant, MD as PCP - General (Internal Medicine)  CHIEF COMPLAINT: MGUS, iron deficiency anemia  INTERVAL HISTORY: Patient returns to clinic today for repeat laboratory work, further evaluation, and  additional discussion of a bone marrow biopsy. He continues to have back pain, but admits this has improved. He does not complain of any weakness or fatigue. He has no neurologic complaints. He denies any fevers or recent illnesses. He has a good appetite and denies weight loss. He has no chest pain or shortness of breath. He denies any nausea, vomiting, constipation, or diarrhea. He has no urinary complaints. Patient offers no further specific complaints today.  REVIEW OF SYSTEMS:   Review of Systems  Constitutional: Negative.  Negative for fever, malaise/fatigue and weight loss.  Respiratory: Negative.  Negative for cough and shortness of breath.   Cardiovascular: Negative.  Negative for chest pain and leg swelling.  Gastrointestinal: Negative.  Negative for abdominal pain, blood in stool, diarrhea and melena.  Musculoskeletal: Positive for back pain.  Skin: Negative.  Negative for rash.  Neurological: Negative.  Negative for sensory change and weakness.  Psychiatric/Behavioral: Negative.  The patient is not nervous/anxious.     As per HPI. Otherwise, a complete review of systems is negative.  PAST MEDICAL HISTORY: Past Medical History:  Diagnosis Date  . Adrenal gland anomaly    enlargement  . CAD (coronary artery disease)   . Carpal tunnel syndrome   . Colonic polyp   . COPD (chronic obstructive pulmonary disease) ([place])   . Degenerative disc disease, lumbar   . Depression   . Diverticulosis   . Dyspnea   . GERD (gastroesophageal reflux disease)   . Hypercholesterolemia   . Hyperkalemia   . Hypertension     . Irritable bowel syndrome   . Monoclonal gammopathy   . Neuropathy   . Personal history of tobacco use, presenting hazards to health 08/17/2015  . Sleep apnea     PAST SURGICAL HISTORY: Past Surgical History:  Procedure Laterality Date  . BUNIONECTOMY  1989  . CARPAL TUNNEL RELEASE Left 2011   ulnar nerve sub muscular at elbow  . CHOLECYSTECTOMY  09/07  . ELECTROPHYSIOLOGIC STUDY N/A 11/15/2015   Procedure: CARDIOVERSION;  Surgeon: Isaias Cowman, MD;  Location: [facility] ORS;  Service: Cardiovascular;  Laterality: N/A;  . EYE SURGERY Bilateral 2010   cataract  . HEMORRHOID SURGERY    . KYPHOPLASTY N/A 11/04/2016   Procedure: KYPHOPLASTY T 12;  Surgeon: Hessie Knows, MD;  Location: [facility] ORS;  Service: Orthopedics;  Laterality: N/A;    FAMILY HISTORY Family History  Problem Relation Age of Onset  . Stroke Mother   . Heart disease Father        MI - 64   . Colon cancer Neg Hx   . Prostate cancer Neg Hx        ADVANCED DIRECTIVES:    HEALTH MAINTENANCE: Social History  Substance Use Topics  . Smoking status: Former Smoker    Quit date: 11/01/2015  . Smokeless tobacco: Never Used     Comment: about 2 cigarettes per day, trying to quit  . Alcohol use 25.2 oz/week    42 Cans of beer per week     Comment: occas     Colonoscopy:  PAP:  Bone density:  Lipid panel:  Allergies  Allergen Reactions  . Ivp  Dye [Iodinated Diagnostic Agents]     Contraindication secondary to IGMgammopathy/Waldren's syndrome      Current Outpatient Prescriptions  Medication Sig Dispense Refill  . acetaminophen (TYLENOL) 500 MG tablet Take 500-1,000 mg by mouth 3 (three) times daily as needed for moderate pain or headache.    . albuterol (PROVENTIL HFA;VENTOLIN HFA) 108 (90 Base) MCG/ACT inhaler Inhale 2 puffs into the lungs every 6 (six) hours as needed for wheezing or shortness of breath.    Marland Kitchen amLODipine (NORVASC) 5 MG tablet Take 1 tablet (5 mg total) by mouth daily. 90 tablet 0  .  budesonide (ENTOCORT EC) 3 MG 24 hr capsule Take 9 mg by mouth daily as needed (colitis flare).     Marland Kitchen ELIQUIS 5 MG TABS tablet Take 5 mg by mouth 2 (two) times daily.     Marland Kitchen FIBER PO Take 2 capsules by mouth daily.    . fluticasone (FLONASE) 50 MCG/ACT nasal spray Place 2 sprays into both nostrils daily. 16 g 0  . fluticasone furoate-vilanterol (BREO ELLIPTA) 100-25 MCG/INH AEPB Inhale 1 puff into the lungs daily. 60 each 11  . furosemide (LASIX) 20 MG tablet Take 1 tablet (20 mg total) by mouth daily as needed. (Patient taking differently: Take 65ms once daily) 30 tablet 5  . gabapentin (NEURONTIN) 100 MG capsule Take two each morning, two each afternoon and three at bedtime (Patient taking differently: 3 (three) times daily as needed. Take two each morning, two each afternoon and three at bedtime as needed for foot pain) 210 capsule 0  . HYDROcodone-acetaminophen (NORCO/VICODIN) 5-325 MG tablet Take by mouth.    . levalbuterol (XOPENEX HFA) 45 MCG/ACT inhaler Inhale into the lungs every 4 (four) hours as needed for wheezing.    .Marland Kitchenlosartan (COZAAR) 100 MG tablet Take 1 tablet (100 mg total) by mouth daily. 90 tablet 0  . metoprolol tartrate (LOPRESSOR) 100 MG tablet Take by mouth.    . mupirocin ointment (BACTROBAN) 2 % Place 1 application into the nose 2 (two) times daily. (Patient taking differently: Apply 1 application topically daily as needed (bumps). ) 22 g 0  . ondansetron (ZOFRAN) 8 MG tablet Take by mouth.    . pantoprazole (PROTONIX) 40 MG tablet Take 1 tablet (40 mg total) by mouth 2 (two) times daily. 180 tablet 0  . sertraline (ZOLOFT) 50 MG tablet Take 1 tablet (50 mg total) by mouth daily. 30 tablet 0  . sodium chloride (OCEAN) 0.65 % SOLN nasal spray Place 2 sprays into both nostrils as needed (dryness).    . vitamin B-12 (CYANOCOBALAMIN) 1000 MCG tablet Take 1,000 mcg by mouth daily.     . isosorbide mononitrate (IMDUR) 30 MG 24 hr tablet Take 30 mg by mouth daily.      No  current facility-administered medications for this visit.     OBJECTIVE: Vitals:   01/03/17 1122  BP: 128/76  Pulse: 62  Temp: 98.2 F (36.8 C)     Body mass index is 26.35 kg/m.    ECOG FS:0 - Asymptomatic  General: Well-developed, well-nourished, no acute distress. Eyes: Pink conjunctiva, anicteric sclera. Lungs: Clear to auscultation bilaterally. Heart: Regular rate and rhythm. No rubs, murmurs, or gallops. Abdomen: Soft, nontender, nondistended. No organomegaly noted, normoactive bowel sounds. Musculoskeletal: No edema, cyanosis, or clubbing. Neuro: Alert, answering all questions appropriately. Cranial nerves grossly intact. Skin: No rashes or petechiae noted. Psych: Normal affect.   LAB RESULTS:  Lab Results  Component Value Date   NA  121 (L) 12/27/2016   K 4.5 12/27/2016   CL 91 (L) 12/27/2016   CO2 24 12/27/2016   GLUCOSE 158 (H) 12/27/2016   BUN 16 12/27/2016   CREATININE 1.02 12/27/2016   CALCIUM 9.1 12/27/2016   PROT 8.3 (H) 12/27/2016   ALBUMIN 3.4 (L) 12/27/2016   AST 19 12/27/2016   ALT 14 (L) 12/27/2016   ALKPHOS 69 12/27/2016   BILITOT 0.3 12/27/2016   GFRNONAA >60 12/27/2016   GFRAA >60 12/27/2016    Lab Results  Component Value Date   WBC 7.0 12/27/2016   NEUTROABS 5.1 12/27/2016   HGB 11.5 (L) 12/27/2016   HCT 33.6 (L) 12/27/2016   MCV 99.5 12/27/2016   PLT 416 12/27/2016   Lab Results  Component Value Date   IRON 113 11/22/2016   TIBC 1,170 (H) 11/22/2016   IRONPCTSAT 10 (L) 11/22/2016    Lab Results  Component Value Date   FERRITIN 28 11/22/2016   Lab Results  Component Value Date   TOTALPROTELP 7.9 12/27/2016   ALBUMINELP 3.1 12/27/2016   A1GS 0.3 12/27/2016   A2GS 0.6 12/27/2016   BETS 1.3 12/27/2016   BETA2SER 0.2 02/14/2016   GAMS 2.6 (H) 12/27/2016   MSPIKE 1.1 (H) 12/27/2016   SPEI Comment 12/27/2016    STUDIES: Dg Chest 2 View  Result Date: 12/13/2016 CLINICAL DATA:  Dyspnea on exertion. EXAM: CHEST  2 VIEW  COMPARISON:  Radiograph of July 15, 2016. FINDINGS: The heart size and mediastinal contours are within normal limits. Stable scarring is noted bilaterally. No acute pulmonary disease is noted. No pneumothorax or pleural effusion is noted. Atherosclerosis of thoracic aorta is noted. The visualized skeletal structures are unremarkable. IMPRESSION: No active cardiopulmonary disease.  Aortic atherosclerosis. Electronically Signed   By: Marijo Conception, M.D.   On: 12/13/2016 12:21    ASSESSMENT: MGUS, iron deficiency anemia.  PLAN:    1. MGUS:  Patient's most recent M spike from Dec 27, 2016 decreased and is now 1.1.  He now has iron deficiency anemia, but no other evidence of endorgan damage. Previously, full workup was negative. 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 Patient has agreed to pursue bone marrow biopsy to obtain a definitive diagnosis. He will have a CT-guided biopsy next week and then return to clinic 2 weeks after his biopsy to discuss the results and treatment planning if necessary.   2. Iron deficiency anemia: Patient's hemoglobin has improved. Previously patient reported a full GI workup that confirmed underlying colitis that likely is contributing to his iron deficiency. His most recent IV Feraheme was in October 2017. 3. Hyponatremia: Unclear etiology, monitor.  Approximately 30 minutes was spent in discussion of which greater than 50% was consultation.  Patient expressed understanding and was in agreement with this plan. He also understands that He can call clinic at any time with any questions, concerns, or complaints.    Lloyd Huger, MD   01/03/2017 12:32 PM   November 04, 2016: Patient underwent kyphoplasty of T12 vertebrae and surgical pathology revealed a plasma cell population concerning for underlying lymphoproliferative disorder such as myeloma.  Plasma cell proportion could not be determined secondary to changes from patient's compression fracture.

## 2017-01-03 ENCOUNTER — Inpatient Hospital Stay (HOSPITAL_BASED_OUTPATIENT_CLINIC_OR_DEPARTMENT_OTHER): Payer: PPO | Admitting: Oncology

## 2017-01-03 VITALS — BP 128/76 | HR 62 | Temp 98.2°F | Ht 71.0 in | Wt 188.9 lb

## 2017-01-03 DIAGNOSIS — D509 Iron deficiency anemia, unspecified: Secondary | ICD-10-CM | POA: Diagnosis not present

## 2017-01-03 DIAGNOSIS — E871 Hypo-osmolality and hyponatremia: Secondary | ICD-10-CM

## 2017-01-03 DIAGNOSIS — Z87891 Personal history of nicotine dependence: Secondary | ICD-10-CM

## 2017-01-03 DIAGNOSIS — K589 Irritable bowel syndrome without diarrhea: Secondary | ICD-10-CM

## 2017-01-03 DIAGNOSIS — I10 Essential (primary) hypertension: Secondary | ICD-10-CM

## 2017-01-03 DIAGNOSIS — K219 Gastro-esophageal reflux disease without esophagitis: Secondary | ICD-10-CM | POA: Diagnosis not present

## 2017-01-03 DIAGNOSIS — J449 Chronic obstructive pulmonary disease, unspecified: Secondary | ICD-10-CM | POA: Diagnosis not present

## 2017-01-03 DIAGNOSIS — E7800 Pure hypercholesterolemia, unspecified: Secondary | ICD-10-CM | POA: Diagnosis not present

## 2017-01-03 DIAGNOSIS — M5136 Other intervertebral disc degeneration, lumbar region: Secondary | ICD-10-CM | POA: Diagnosis not present

## 2017-01-03 DIAGNOSIS — F329 Major depressive disorder, single episode, unspecified: Secondary | ICD-10-CM

## 2017-01-03 DIAGNOSIS — I2510 Atherosclerotic heart disease of native coronary artery without angina pectoris: Secondary | ICD-10-CM

## 2017-01-03 DIAGNOSIS — R0602 Shortness of breath: Secondary | ICD-10-CM | POA: Diagnosis not present

## 2017-01-03 DIAGNOSIS — Z79899 Other long term (current) drug therapy: Secondary | ICD-10-CM

## 2017-01-03 DIAGNOSIS — E875 Hyperkalemia: Secondary | ICD-10-CM

## 2017-01-03 DIAGNOSIS — E279 Disorder of adrenal gland, unspecified: Secondary | ICD-10-CM | POA: Diagnosis not present

## 2017-01-03 DIAGNOSIS — Z8719 Personal history of other diseases of the digestive system: Secondary | ICD-10-CM

## 2017-01-03 DIAGNOSIS — I700 Atherosclerosis of aorta: Secondary | ICD-10-CM

## 2017-01-03 DIAGNOSIS — Z86010 Personal history of colonic polyps: Secondary | ICD-10-CM

## 2017-01-03 DIAGNOSIS — G4730 Sleep apnea, unspecified: Secondary | ICD-10-CM

## 2017-01-03 DIAGNOSIS — Z7901 Long term (current) use of anticoagulants: Secondary | ICD-10-CM

## 2017-01-03 DIAGNOSIS — G629 Polyneuropathy, unspecified: Secondary | ICD-10-CM

## 2017-01-03 DIAGNOSIS — D472 Monoclonal gammopathy: Secondary | ICD-10-CM

## 2017-01-03 NOTE — Progress Notes (Signed)
Patient here for follow up no changes since last appointment 

## 2017-01-07 ENCOUNTER — Other Ambulatory Visit: Payer: Self-pay | Admitting: General Surgery

## 2017-01-07 ENCOUNTER — Telehealth: Payer: Self-pay

## 2017-01-07 DIAGNOSIS — M810 Age-related osteoporosis without current pathological fracture: Secondary | ICD-10-CM | POA: Diagnosis not present

## 2017-01-07 DIAGNOSIS — Z8781 Personal history of (healed) traumatic fracture: Secondary | ICD-10-CM | POA: Diagnosis not present

## 2017-01-07 NOTE — Telephone Encounter (Signed)
Spoke with patient, and advised him to hold his eliquis until after procedure tomorrow. He will take it tomorrow night.

## 2017-01-08 ENCOUNTER — Other Ambulatory Visit (HOSPITAL_COMMUNITY)
Admission: RE | Admit: 2017-01-08 | Discharge: 2017-01-08 | Disposition: A | Discharge status: Home / Self Care | Payer: PPO | Source: Ambulatory Visit | Attending: Oncology | Admitting: Oncology

## 2017-01-08 ENCOUNTER — Ambulatory Visit
Admission: RE | Admit: 2017-01-08 | Discharge: 2017-01-08 | Disposition: A | Discharge status: Home / Self Care | Payer: PPO | Source: Ambulatory Visit | Attending: Oncology | Admitting: Oncology

## 2017-01-08 DIAGNOSIS — D649 Anemia, unspecified: Secondary | ICD-10-CM | POA: Insufficient documentation

## 2017-01-08 DIAGNOSIS — Z888 Allergy status to other drugs, medicaments and biological substances status: Secondary | ICD-10-CM | POA: Diagnosis not present

## 2017-01-08 DIAGNOSIS — Z8249 Family history of ischemic heart disease and other diseases of the circulatory system: Secondary | ICD-10-CM | POA: Insufficient documentation

## 2017-01-08 DIAGNOSIS — I10 Essential (primary) hypertension: Secondary | ICD-10-CM | POA: Insufficient documentation

## 2017-01-08 DIAGNOSIS — I2510 Atherosclerotic heart disease of native coronary artery without angina pectoris: Secondary | ICD-10-CM | POA: Diagnosis not present

## 2017-01-08 DIAGNOSIS — K589 Irritable bowel syndrome without diarrhea: Secondary | ICD-10-CM | POA: Diagnosis not present

## 2017-01-08 DIAGNOSIS — Z9889 Other specified postprocedural states: Secondary | ICD-10-CM | POA: Insufficient documentation

## 2017-01-08 DIAGNOSIS — Z800 Family history of malignant neoplasm of digestive organs: Secondary | ICD-10-CM | POA: Diagnosis not present

## 2017-01-08 DIAGNOSIS — J449 Chronic obstructive pulmonary disease, unspecified: Secondary | ICD-10-CM | POA: Insufficient documentation

## 2017-01-08 DIAGNOSIS — Z87891 Personal history of nicotine dependence: Secondary | ICD-10-CM | POA: Diagnosis not present

## 2017-01-08 DIAGNOSIS — Z823 Family history of stroke: Secondary | ICD-10-CM | POA: Insufficient documentation

## 2017-01-08 DIAGNOSIS — D472 Monoclonal gammopathy: Secondary | ICD-10-CM | POA: Insufficient documentation

## 2017-01-08 DIAGNOSIS — G8929 Other chronic pain: Secondary | ICD-10-CM | POA: Diagnosis not present

## 2017-01-08 DIAGNOSIS — E875 Hyperkalemia: Secondary | ICD-10-CM | POA: Diagnosis not present

## 2017-01-08 DIAGNOSIS — G4730 Sleep apnea, unspecified: Secondary | ICD-10-CM | POA: Diagnosis not present

## 2017-01-08 DIAGNOSIS — I4892 Unspecified atrial flutter: Secondary | ICD-10-CM | POA: Diagnosis not present

## 2017-01-08 DIAGNOSIS — Z8042 Family history of malignant neoplasm of prostate: Secondary | ICD-10-CM | POA: Diagnosis not present

## 2017-01-08 DIAGNOSIS — K219 Gastro-esophageal reflux disease without esophagitis: Secondary | ICD-10-CM | POA: Diagnosis not present

## 2017-01-08 DIAGNOSIS — Z79899 Other long term (current) drug therapy: Secondary | ICD-10-CM | POA: Diagnosis not present

## 2017-01-08 DIAGNOSIS — C8519 Unspecified B-cell lymphoma, extranodal and solid organ sites: Secondary | ICD-10-CM | POA: Diagnosis not present

## 2017-01-08 DIAGNOSIS — Z9049 Acquired absence of other specified parts of digestive tract: Secondary | ICD-10-CM | POA: Insufficient documentation

## 2017-01-08 DIAGNOSIS — F329 Major depressive disorder, single episode, unspecified: Secondary | ICD-10-CM | POA: Insufficient documentation

## 2017-01-08 DIAGNOSIS — G5600 Carpal tunnel syndrome, unspecified upper limb: Secondary | ICD-10-CM | POA: Diagnosis not present

## 2017-01-08 DIAGNOSIS — M549 Dorsalgia, unspecified: Secondary | ICD-10-CM | POA: Insufficient documentation

## 2017-01-08 DIAGNOSIS — Z7901 Long term (current) use of anticoagulants: Secondary | ICD-10-CM | POA: Insufficient documentation

## 2017-01-08 DIAGNOSIS — E7800 Pure hypercholesterolemia, unspecified: Secondary | ICD-10-CM | POA: Diagnosis not present

## 2017-01-08 LAB — APTT: aPTT: 37 seconds — ABNORMAL HIGH (ref 24–36)

## 2017-01-08 LAB — DIFFERENTIAL
BASOS ABS: 0 10*3/uL (ref 0–0.1)
BASOS PCT: 1 %
EOS ABS: 0 10*3/uL (ref 0–0.7)
EOS PCT: 0 %
LYMPHS PCT: 15 %
Lymphs Abs: 1 10*3/uL (ref 1.0–3.6)
MONOS PCT: 10 %
Monocytes Absolute: 0.6 10*3/uL (ref 0.2–1.0)
Neutro Abs: 5 10*3/uL (ref 1.4–6.5)
Neutrophils Relative %: 74 %

## 2017-01-08 LAB — CBC
HEMATOCRIT: 31.8 % — AB (ref 40.0–52.0)
Hemoglobin: 10.9 g/dL — ABNORMAL LOW (ref 13.0–18.0)
MCH: 33.8 pg (ref 26.0–34.0)
MCHC: 34.2 g/dL (ref 32.0–36.0)
MCV: 98.9 fL (ref 80.0–100.0)
PLATELETS: 426 10*3/uL (ref 150–440)
RBC: 3.21 MIL/uL — ABNORMAL LOW (ref 4.40–5.90)
RDW: 15.1 % — AB (ref 11.5–14.5)
WBC: 6.8 10*3/uL (ref 3.8–10.6)

## 2017-01-08 LAB — PROTIME-INR
INR: 1.11
Prothrombin Time: 14.3 seconds (ref 11.4–15.2)

## 2017-01-08 MED ORDER — SODIUM CHLORIDE 0.9 % IV SOLN: INTRAVENOUS | Status: DC

## 2017-01-08 MED ORDER — MIDAZOLAM HCL 5 MG/5ML IJ SOLN
INTRAMUSCULAR | Status: AC | PRN
  Administered 2017-01-08 (×2): 1 mg via INTRAVENOUS

## 2017-01-08 MED ORDER — FENTANYL CITRATE (PF) 100 MCG/2ML IJ SOLN
INTRAMUSCULAR | Status: AC | PRN
  Administered 2017-01-08: 25 ug via INTRAVENOUS
  Administered 2017-01-08: 50 ug via INTRAVENOUS

## 2017-01-08 NOTE — Procedures (Signed)
BM aspirate and core R iliac bone EBL 0 Comp 0

## 2017-01-08 NOTE — H&P (Signed)
Chief Complaint: Patient was seen in consultation today for No chief complaint on file.  at the request of Finnegan,Timothy J  Referring Physician(s): Finnegan,Timothy J  Supervising Physician: Marybelle Killings  Patient Status: [facility] - Out-pt  History of Present Illness: Troy Lopez is a 76 y.o. male with anemia and monoclonal gammopathy. He has a history of atrial flutter and is on Eliquis. This was stopped two days ago. He also has chronic back pain.  Past Medical History:  Diagnosis Date  . Adrenal gland anomaly    enlargement  . CAD (coronary artery disease)   . Carpal tunnel syndrome   . Colonic polyp   . COPD (chronic obstructive pulmonary disease) ([place])   . Degenerative disc disease, lumbar   . Depression   . Diverticulosis   . Dyspnea   . GERD (gastroesophageal reflux disease)   . Hypercholesterolemia   . Hyperkalemia   . Hypertension   . Irritable bowel syndrome   . Monoclonal gammopathy   . Neuropathy   . Personal history of tobacco use, presenting hazards to health 08/17/2015  . Sleep apnea     Past Surgical History:  Procedure Laterality Date  . BUNIONECTOMY  1989  . CARPAL TUNNEL RELEASE Left 2011   ulnar nerve sub muscular at elbow  . CHOLECYSTECTOMY  09/07  . ELECTROPHYSIOLOGIC STUDY N/A 11/15/2015   Procedure: CARDIOVERSION;  Surgeon: Isaias Cowman, MD;  Location: [facility] ORS;  Service: Cardiovascular;  Laterality: N/A;  . EYE SURGERY Bilateral 2010   cataract  . HEMORRHOID SURGERY    . KYPHOPLASTY N/A 11/04/2016   Procedure: KYPHOPLASTY T 12;  Surgeon: Hessie Knows, MD;  Location: [facility] ORS;  Service: Orthopedics;  Laterality: N/A;    Allergies: Ivp dye [iodinated diagnostic agents]  Medications: Prior to Admission medications   Medication Sig Start Date End Date Taking? Authorizing Provider  acetaminophen (TYLENOL) 500 MG tablet Take 500-1,000 mg by mouth 3 (three) times daily as needed for moderate pain or headache.   Yes [provider]  albuterol (PROVENTIL HFA;VENTOLIN HFA) 108 (90 Base) MCG/ACT inhaler Inhale 2 puffs into the lungs every 6 (six) hours as needed for wheezing or shortness of breath.   Yes [provider]  amLODipine (NORVASC) 5 MG tablet Take 1 tablet (5 mg total) by mouth daily. 10/17/16  Yes Einar Pheasant, MD  ELIQUIS 5 MG TABS tablet Take 5 mg by mouth 2 (two) times daily.  05/23/16  Yes [provider]  FIBER PO Take 2 capsules by mouth daily.   Yes [provider]  fluticasone (FLONASE) 50 MCG/ACT nasal spray Place 2 sprays into both nostrils daily. 12/06/16  Yes Einar Pheasant, MD  fluticasone furoate-vilanterol (BREO ELLIPTA) 100-25 MCG/INH AEPB Inhale 1 puff into the lungs daily. 05/17/16  Yes Wilhelmina [name], MD  furosemide (LASIX) 20 MG tablet Take 1 tablet (20 mg total) by mouth daily as needed. Patient taking differently: Take 42ms once daily 06/19/16  Yes SEinar Pheasant MD  gabapentin (NEURONTIN) 100 MG capsule Take two each morning, two each afternoon and three at bedtime Patient taking differently: 3 (three) times daily as needed. Take two each morning, two each afternoon and three at bedtime as needed for foot pain 09/10/16  Yes SEinar Pheasant MD  isosorbide mononitrate (IMDUR) 30 MG 24 hr tablet Take 30 mg by mouth daily.  07/21/15 01/08/17 Yes [provider]  losartan (COZAAR) 100 MG tablet Take 1 tablet (100 mg total) by mouth daily. 10/17/16  Yes Einar Pheasant, MD  metoprolol tartrate (LOPRESSOR) 100 MG tablet Take by mouth. 11/25/16  Yes [provider]  mupirocin ointment (BACTROBAN) 2 % Place 1 application into the nose 2 (two) times daily. Patient taking differently: Apply 1 application topically daily as needed (bumps).  01/17/16  Yes Einar Pheasant, MD  ondansetron (ZOFRAN) 8 MG tablet Take by mouth. 12/26/16  Yes [provider]  pantoprazole (PROTONIX) 40 MG tablet Take 1 tablet (40 mg total) by mouth 2 (two) times  daily. 11/11/16  Yes Einar Pheasant, MD  sertraline (ZOLOFT) 50 MG tablet Take 1 tablet (50 mg total) by mouth daily. 12/26/16  Yes Einar Pheasant, MD  sodium chloride (OCEAN) 0.65 % SOLN nasal spray Place 2 sprays into both nostrils as needed (dryness).   Yes [provider]  vitamin B-12 (CYANOCOBALAMIN) 1000 MCG tablet Take 1,000 mcg by mouth daily.    Yes [provider]  budesonide (ENTOCORT EC) 3 MG 24 hr capsule Take 9 mg by mouth daily as needed (colitis flare).  05/31/15   [provider]  HYDROcodone-acetaminophen (NORCO/VICODIN) 5-325 MG tablet Take by mouth. 12/03/16   [provider]  levalbuterol Penne Lash HFA) 45 MCG/ACT inhaler Inhale into the lungs every 4 (four) hours as needed for wheezing.    [provider]     Family History  Problem Relation Age of Onset  . Stroke Mother   . Heart disease Father        MI - 66   . Colon cancer Neg Hx   . Prostate cancer Neg Hx     Social History   Social History  . Marital status: Married    Spouse name: N/A  . Number of children: 3  . Years of education: N/A   Social History Main Topics  . Smoking status: Former Smoker    Quit date: 11/01/2015  . Smokeless tobacco: Never Used     Comment: about 2 cigarettes per day, trying to quit  . Alcohol use 25.2 oz/week    42 Cans of beer per week     Comment: occas  . Drug use: No  . Sexual activity: Not Asked   Other Topics Concern  . None   Social History Narrative  . None    Review of Systems: A 12 point ROS discussed and pertinent positives are indicated in the HPI above.  All other systems are negative.  Review of Systems  Vital Signs: BP 100/65   Pulse 61   Temp 98.1 F (36.7 C) (Oral)   SpO2 92%   Physical Exam  Constitutional: He is oriented to person, place, and time. He appears well-developed and well-nourished.  HENT:  Head: Normocephalic and atraumatic.  Cardiovascular: Normal rate and regular rhythm.     Pulmonary/Chest: Effort normal and breath sounds normal.  Neurological: He is alert and oriented to person, place, and time.    Mallampati Score:   2  Imaging: Dg Chest 2 View  Result Date: 12/13/2016 CLINICAL DATA:  Dyspnea on exertion. EXAM: CHEST  2 VIEW COMPARISON:  Radiograph of July 15, 2016. FINDINGS: The heart size and mediastinal contours are within normal limits. Stable scarring is noted bilaterally. No acute pulmonary disease is noted. No pneumothorax or pleural effusion is noted. Atherosclerosis of thoracic aorta is noted. The visualized skeletal structures are unremarkable. IMPRESSION: No active cardiopulmonary disease.  Aortic atherosclerosis. Electronically Signed   By: Marijo Conception, M.D.   On: 12/13/2016 12:21    Labs:  CBC:  Recent Labs  09/18/16 1338 11/04/16 1223 11/22/16 1332 12/27/16 1100 01/08/17 0810  WBC 9.8  --  6.4 7.0 6.8  HGB 12.7* 13.3 12.7* 11.5* 10.9*  HCT 37.1* 39.0 36.8* 33.6* 31.8*  PLT 402  --  378 416 426    COAGS:  Recent Labs  01/08/17 0810  INR 1.11  APTT 37*    BMP:  Recent Labs  02/14/16 0920  04/01/16 1158 09/18/16 1338 11/01/16 1058 11/04/16 1223 12/27/16 1100  NA 127*  < > 129* 127*  --  128* 121*  K 4.8  --  5.0 3.9 5.4* 4.9 4.5  CL 90*  --  96* 94*  --   --  91*  CO2 29  --  24 24  --   --  24  GLUCOSE 116*  --  97 121*  --  100* 158*  BUN 10  --  12 11  --   --  16  CALCIUM 9.8  --  9.5 9.2  --   --  9.1  CREATININE 0.94  --  1.22 1.02  --   --  1.02  GFRNONAA  --   --  57* >60  --   --  >60  GFRAA  --   --  >60 >60  --   --  >60  < > = values in this interval not displayed.  LIVER FUNCTION TESTS:  Recent Labs  02/14/16 0920 12/27/16 1100  BILITOT 0.7 0.3  AST 14 19  ALT 10 14*  ALKPHOS 79 69  PROT 8.5* 8.3*  ALBUMIN 3.4* 3.4*    TUMOR MARKERS: No results for input(s): AFPTM, CEA, CA199, CHROMGRNA in the last 8760 hours.  Assessment and Plan:  Monoclonal gammopathy. Bone marrow  biopsy is to follow.  Thank you for this interesting consult.  I greatly enjoyed meeting ABENEZER ODONELL and look forward to participating in their care.  A copy of this report was sent to the requesting provider on this date.  Electronically Signed: Laura-Lee Villegas, ART A, MD 01/08/2017, 9:11 AM   I spent a total of  40 Minutes   in face to face in clinical consultation, greater than 50% of which was counseling/coordinating care for bone marrow biopsy.

## 2017-01-15 DIAGNOSIS — M47816 Spondylosis without myelopathy or radiculopathy, lumbar region: Secondary | ICD-10-CM | POA: Diagnosis not present

## 2017-01-21 DIAGNOSIS — M810 Age-related osteoporosis without current pathological fracture: Secondary | ICD-10-CM | POA: Diagnosis not present

## 2017-01-22 ENCOUNTER — Other Ambulatory Visit: Payer: Self-pay | Admitting: Internal Medicine

## 2017-01-23 NOTE — Progress Notes (Signed)
[facility]  [phone] [phone]  ID: Troy Lopez OB: 05-03-1941  MR#: [id number]  KZS#:[id number]  Patient Care Team: Einar Pheasant, MD as PCP - General (Internal Medicine)  CHIEF COMPLAINT: [name]'s macroglobulinemia  INTERVAL HISTORY: Patient returns to clinic today for repeat laboratory work, further evaluation, and  discussion of his bone marrow biopsy results. He continues to have back pain, but otherwise feels well. He does not complain of any weakness or fatigue. He has no neurologic complaints. He denies any fevers or recent illnesses. He has a good appetite and denies weight loss. He has no chest pain or shortness of breath. He denies any nausea, vomiting, constipation, or diarrhea. He has no urinary complaints. Patient offers no further specific complaints today.  REVIEW OF SYSTEMS:   Review of Systems  Constitutional: Negative.  Negative for fever, malaise/fatigue and weight loss.  Respiratory: Negative.  Negative for cough and shortness of breath.   Cardiovascular: Negative.  Negative for chest pain and leg swelling.  Gastrointestinal: Negative.  Negative for abdominal pain, blood in stool, diarrhea and melena.  Musculoskeletal: Positive for back pain.  Skin: Negative.  Negative for rash.  Neurological: Negative.  Negative for sensory change and weakness.  Psychiatric/Behavioral: Negative.  The patient is not nervous/anxious.     As per HPI. Otherwise, a complete review of systems is negative.  PAST MEDICAL HISTORY: Past Medical History:  Diagnosis Date  . Adrenal gland anomaly    enlargement  . CAD (coronary artery disease)   . Carpal tunnel syndrome   . Colonic polyp   . COPD (chronic obstructive pulmonary disease) ([place])   . Degenerative disc disease, lumbar   . Depression   . Diverticulosis   . Dyspnea   . GERD (gastroesophageal reflux disease)   . Hypercholesterolemia   . Hyperkalemia   . Hypertension     . Irritable bowel syndrome   . Monoclonal gammopathy   . Neuropathy   . Personal history of tobacco use, presenting hazards to health 08/17/2015  . Sleep apnea     PAST SURGICAL HISTORY: Past Surgical History:  Procedure Laterality Date  . BUNIONECTOMY  1989  . CARPAL TUNNEL RELEASE Left 2011   ulnar nerve sub muscular at elbow  . CHOLECYSTECTOMY  09/07  . ELECTROPHYSIOLOGIC STUDY N/A 11/15/2015   Procedure: CARDIOVERSION;  Surgeon: Isaias Cowman, MD;  Location: [facility] ORS;  Service: Cardiovascular;  Laterality: N/A;  . EYE SURGERY Bilateral 2010   cataract  . HEMORRHOID SURGERY    . KYPHOPLASTY N/A 11/04/2016   Procedure: KYPHOPLASTY T 12;  Surgeon: Hessie Knows, MD;  Location: [facility] ORS;  Service: Orthopedics;  Laterality: N/A;    FAMILY HISTORY Family History  Problem Relation Age of Onset  . Stroke Mother   . Heart disease Father        MI - 66   . Colon cancer Neg Hx   . Prostate cancer Neg Hx        ADVANCED DIRECTIVES:    HEALTH MAINTENANCE: Social History  Substance Use Topics  . Smoking status: Former Smoker    Quit date: 11/01/2015  . Smokeless tobacco: Never Used     Comment: about 2 cigarettes per day, trying to quit  . Alcohol use 25.2 oz/week    42 Cans of beer per week     Comment: occas     Colonoscopy:  PAP:  Bone density:  Lipid panel:  Allergies  Allergen Reactions  . Ivp Dye [Iodinated Diagnostic  Agents]     Contraindication secondary to IGMgammopathy/Waldren's syndrome      Current Outpatient Prescriptions  Medication Sig Dispense Refill  . acetaminophen (TYLENOL) 500 MG tablet Take 500-1,000 mg by mouth 3 (three) times daily as needed for moderate pain or headache.    . albuterol (PROVENTIL HFA;VENTOLIN HFA) 108 (90 Base) MCG/ACT inhaler Inhale 2 puffs into the lungs every 6 (six) hours as needed for wheezing or shortness of breath.    Marland Kitchen amLODipine (NORVASC) 10 MG tablet Take 1 tablet (5 mg total) by mouth daily. 90 tablet 0   . budesonide (ENTOCORT EC) 3 MG 24 hr capsule Take 9 mg by mouth daily as needed (colitis flare).     Marland Kitchen ELIQUIS 5 MG TABS tablet Take 5 mg by mouth 2 (two) times daily.     Marland Kitchen FIBER PO Take 2 capsules by mouth daily.    . fluticasone (FLONASE) 50 MCG/ACT nasal spray Place 2 sprays into both nostrils daily. 16 g 0  . fluticasone furoate-vilanterol (BREO ELLIPTA) 100-25 MCG/INH AEPB Inhale 1 puff into the lungs daily. 60 each 11  . furosemide (LASIX) 20 MG tablet Take 1 tablet (20 mg total) by mouth daily as needed. (Patient taking differently: Take 69ms once daily) 30 tablet 5  . gabapentin (NEURONTIN) 100 MG capsule Take two each morning, two each afternoon and three at bedtime (Patient taking differently: 3 (three) times daily as needed. Take two each morning, two each afternoon and three at bedtime as needed for foot pain) 210 capsule 0  . losartan (COZAAR) 100 MG tablet Take 1 tablet (100 mg total) by mouth daily. 90 tablet 0  . metoprolol tartrate (LOPRESSOR) 100 MG tablet Take by mouth.    . mupirocin ointment (BACTROBAN) 2 % Place 1 application into the nose 2 (two) times daily. (Patient not taking: Reported on 01/28/2017) 22 g 0  . ondansetron (ZOFRAN) 8 MG tablet Take by mouth.    . pantoprazole (PROTONIX) 40 MG tablet Take 1 tablet (40 mg total) by mouth 2 (two) times daily. 180 tablet 0  . sertraline (ZOLOFT) 50 MG tablet Take 1 tablet (50 mg total) by mouth daily. 30 tablet 0  . sodium chloride (OCEAN) 0.65 % SOLN nasal spray Place 2 sprays into both nostrils as needed (dryness).    . vitamin B-12 (CYANOCOBALAMIN) 1000 MCG tablet Take 1,000 mcg by mouth daily.     . isosorbide mononitrate (IMDUR) 30 MG 24 hr tablet Take 30 mg by mouth daily.      No current facility-administered medications for this visit.     OBJECTIVE: Vitals:   01/24/17 1127  BP: (!) 146/80  Pulse: 65  Resp: 18  Temp: 97.4 F (36.3 C)     Body mass index is 26.01 kg/m.    ECOG FS:0 -  Asymptomatic  General: Well-developed, well-nourished, no acute distress. Eyes: Pink conjunctiva, anicteric sclera. Lungs: Clear to auscultation bilaterally. Heart: Regular rate and rhythm. No rubs, murmurs, or gallops. Abdomen: Soft, nontender, nondistended. No organomegaly noted, normoactive bowel sounds. Musculoskeletal: No edema, cyanosis, or clubbing. Neuro: Alert, answering all questions appropriately. Cranial nerves grossly intact. Skin: No rashes or petechiae noted. Psych: Normal affect.   LAB RESULTS:  Lab Results  Component Value Date   NA 124 (L) 01/30/2017   K 4.8 01/30/2017   CL 93 (L) 01/28/2017   CO2 25 01/28/2017   GLUCOSE 97 01/28/2017   BUN 15 01/28/2017   CREATININE 1.08 01/28/2017   CALCIUM 10.3 01/28/2017  PROT 8.3 (H) 12/27/2016   ALBUMIN 3.4 (L) 12/27/2016   AST 19 12/27/2016   ALT 14 (L) 12/27/2016   ALKPHOS 69 12/27/2016   BILITOT 0.3 12/27/2016   GFRNONAA >60 12/27/2016   GFRAA >60 12/27/2016    Lab Results  Component Value Date   WBC 6.8 01-21-2017   NEUTROABS 5.0 01/21/2017   HGB 10.9 (L) 01-21-17   HCT 31.8 (L) 01/21/17   MCV 98.9 January 21, 2017   PLT 426 01-21-2017   Lab Results  Component Value Date   IRON 113 11/22/2016   TIBC 1,170 (H) 11/22/2016   IRONPCTSAT 10 (L) 11/22/2016    Lab Results  Component Value Date   FERRITIN 28 11/22/2016   Lab Results  Component Value Date   TOTALPROTELP 7.9 12/27/2016   ALBUMINELP 3.1 12/27/2016   A1GS 0.3 12/27/2016   A2GS 0.6 12/27/2016   BETS 1.3 12/27/2016   BETA2SER 0.2 02/14/2016   GAMS 2.6 (H) 12/27/2016   MSPIKE 1.1 (H) 12/27/2016   SPEI Comment 12/27/2016    STUDIES: Ct Biopsy  Result Date: Jan 21, 2017 INDICATION: Monoclonal gammopathy EXAM: CT BIOPSY MEDICATIONS: None. ANESTHESIA/SEDATION: Fentanyl 75 mcg IV; Versed 2 mg IV Moderate Sedation Time:  14 The patient was continuously monitored during the procedure by the interventional radiology nurse under my direct  supervision. FLUOROSCOPY TIME:  None. COMPLICATIONS: None immediate. PROCEDURE: Informed written consent was obtained from the patient after a thorough discussion of the procedural risks, benefits and alternatives. All questions were addressed. Maximal Sterile Barrier Technique was utilized including caps, mask, sterile gowns, sterile gloves, sterile drape, hand hygiene and skin antiseptic. A timeout was performed prior to the initiation of the procedure. Under CT guidance, an 11 gauge needle was inserted into the right iliac bone via posterior approach. Aspirates and a core were obtained. Patient tolerated the procedure well without complication. Vital sign monitoring by nursing staff during the procedure will continue as patient is in the special procedures unit for post procedure observation. FINDINGS: The images document guide needle placement within the right iliac bone. IMPRESSION: Successful right iliac bone marrow aspirate and core. Electronically Signed   By: Marybelle Killings M.D.   On: 21-Jan-2017 11:27    ASSESSMENT: [name]'s macroglobulinemia.  PLAN:    1. [name]'s macroglobulinemia:  Bone marrow biopsy results reviewed independently confirming underlying [name]'s. Patient's most recent M spike from Dec 27, 2016 decreased and is now 1.1.  His IgM component is elevated, but essentially unchanged at 3126. Patient has a mild iron deficiency anemia, but no other evidence of endorgan damage. He does not require any treatment at this time. If he had progression of disease as evidenced by B-symptoms or endorgan damage, will consider Rituxan-based therapy. Patient has requested no further follow-up and that his labs be monitored by his primary care physician. Recommend monitoring CBC, met B, SIEP as well as IgM levels 2-3 times per year. Please refer patient back if there are any questions or concerns.  2. Iron deficiency anemia: Patient's hemoglobin has improved. Previously patient reported a  full GI workup that confirmed underlying colitis that likely is contributing to his iron deficiency. His most recent IV Feraheme was in October 2017. Have recommended oral iron supplementation. 3. Hyponatremia: Unclear etiology, monitor.  Approximately 30 minutes was spent in discussion of which greater than 50% was consultation.  Patient expressed understanding and was in agreement with this plan. He also understands that He can call clinic at any time with any questions, concerns, or complaints.  Lloyd Huger, MD   01/31/2017 12:04 PM

## 2017-01-24 ENCOUNTER — Inpatient Hospital Stay: Payer: PPO | Attending: Oncology | Admitting: Oncology

## 2017-01-24 VITALS — BP 146/80 | HR 65 | Temp 97.4°F | Resp 18 | Wt 186.5 lb

## 2017-01-24 DIAGNOSIS — G4730 Sleep apnea, unspecified: Secondary | ICD-10-CM | POA: Insufficient documentation

## 2017-01-24 DIAGNOSIS — J449 Chronic obstructive pulmonary disease, unspecified: Secondary | ICD-10-CM | POA: Insufficient documentation

## 2017-01-24 DIAGNOSIS — D472 Monoclonal gammopathy: Secondary | ICD-10-CM | POA: Insufficient documentation

## 2017-01-24 DIAGNOSIS — Z86010 Personal history of colonic polyps: Secondary | ICD-10-CM | POA: Insufficient documentation

## 2017-01-24 DIAGNOSIS — F329 Major depressive disorder, single episode, unspecified: Secondary | ICD-10-CM | POA: Insufficient documentation

## 2017-01-24 DIAGNOSIS — E7800 Pure hypercholesterolemia, unspecified: Secondary | ICD-10-CM | POA: Insufficient documentation

## 2017-01-24 DIAGNOSIS — M549 Dorsalgia, unspecified: Secondary | ICD-10-CM | POA: Diagnosis not present

## 2017-01-24 DIAGNOSIS — C880 Waldenstrom macroglobulinemia: Secondary | ICD-10-CM | POA: Diagnosis not present

## 2017-01-24 DIAGNOSIS — M5136 Other intervertebral disc degeneration, lumbar region: Secondary | ICD-10-CM | POA: Diagnosis not present

## 2017-01-24 DIAGNOSIS — K589 Irritable bowel syndrome without diarrhea: Secondary | ICD-10-CM | POA: Insufficient documentation

## 2017-01-24 DIAGNOSIS — E871 Hypo-osmolality and hyponatremia: Secondary | ICD-10-CM | POA: Diagnosis not present

## 2017-01-24 DIAGNOSIS — E875 Hyperkalemia: Secondary | ICD-10-CM | POA: Insufficient documentation

## 2017-01-24 DIAGNOSIS — Z87891 Personal history of nicotine dependence: Secondary | ICD-10-CM | POA: Diagnosis not present

## 2017-01-24 DIAGNOSIS — Z8719 Personal history of other diseases of the digestive system: Secondary | ICD-10-CM | POA: Insufficient documentation

## 2017-01-24 DIAGNOSIS — D509 Iron deficiency anemia, unspecified: Secondary | ICD-10-CM | POA: Insufficient documentation

## 2017-01-24 DIAGNOSIS — K219 Gastro-esophageal reflux disease without esophagitis: Secondary | ICD-10-CM

## 2017-01-24 DIAGNOSIS — I2510 Atherosclerotic heart disease of native coronary artery without angina pectoris: Secondary | ICD-10-CM | POA: Diagnosis not present

## 2017-01-24 DIAGNOSIS — I10 Essential (primary) hypertension: Secondary | ICD-10-CM | POA: Insufficient documentation

## 2017-01-24 NOTE — Progress Notes (Signed)
Patient offers no concerns today. 

## 2017-01-27 ENCOUNTER — Encounter (HOSPITAL_COMMUNITY): Payer: Self-pay

## 2017-01-28 ENCOUNTER — Encounter: Payer: Self-pay | Admitting: Internal Medicine

## 2017-01-28 ENCOUNTER — Ambulatory Visit (INDEPENDENT_AMBULATORY_CARE_PROVIDER_SITE_OTHER): Payer: PPO | Admitting: Internal Medicine

## 2017-01-28 ENCOUNTER — Other Ambulatory Visit: Payer: Self-pay | Admitting: Internal Medicine

## 2017-01-28 VITALS — BP 130/76 | HR 68 | Temp 98.6°F | Resp 14 | Ht 71.0 in | Wt 186.2 lb

## 2017-01-28 DIAGNOSIS — I2510 Atherosclerotic heart disease of native coronary artery without angina pectoris: Secondary | ICD-10-CM

## 2017-01-28 DIAGNOSIS — Z8781 Personal history of (healed) traumatic fracture: Secondary | ICD-10-CM

## 2017-01-28 DIAGNOSIS — F1010 Alcohol abuse, uncomplicated: Secondary | ICD-10-CM

## 2017-01-28 DIAGNOSIS — I10 Essential (primary) hypertension: Secondary | ICD-10-CM

## 2017-01-28 DIAGNOSIS — J449 Chronic obstructive pulmonary disease, unspecified: Secondary | ICD-10-CM | POA: Diagnosis not present

## 2017-01-28 DIAGNOSIS — H6121 Impacted cerumen, right ear: Secondary | ICD-10-CM | POA: Diagnosis not present

## 2017-01-28 DIAGNOSIS — K52831 Collagenous colitis: Secondary | ICD-10-CM

## 2017-01-28 DIAGNOSIS — D472 Monoclonal gammopathy: Secondary | ICD-10-CM

## 2017-01-28 DIAGNOSIS — E871 Hypo-osmolality and hyponatremia: Secondary | ICD-10-CM

## 2017-01-28 DIAGNOSIS — F329 Major depressive disorder, single episode, unspecified: Secondary | ICD-10-CM | POA: Diagnosis not present

## 2017-01-28 DIAGNOSIS — K219 Gastro-esophageal reflux disease without esophagitis: Secondary | ICD-10-CM

## 2017-01-28 DIAGNOSIS — M549 Dorsalgia, unspecified: Secondary | ICD-10-CM | POA: Diagnosis not present

## 2017-01-28 DIAGNOSIS — G8929 Other chronic pain: Secondary | ICD-10-CM

## 2017-01-28 DIAGNOSIS — F32A Depression, unspecified: Secondary | ICD-10-CM

## 2017-01-28 DIAGNOSIS — E875 Hyperkalemia: Secondary | ICD-10-CM

## 2017-01-28 LAB — BASIC METABOLIC PANEL
BUN: 15 mg/dL (ref 6–23)
CALCIUM: 10.3 mg/dL (ref 8.4–10.5)
CO2: 25 mEq/L (ref 19–32)
CREATININE: 1.08 mg/dL (ref 0.40–1.50)
Chloride: 93 mEq/L — ABNORMAL LOW (ref 96–112)
GFR: 70.87 mL/min (ref >60.00)
Glucose, Bld: 97 mg/dL (ref 70–99)
Potassium: 5.3 mEq/L — ABNORMAL HIGH (ref 3.5–5.1)
Sodium: 123 mEq/L — ABNORMAL LOW (ref 135–145)

## 2017-01-28 LAB — TSH: TSH: 1.51 u[IU]/mL (ref 0.35–4.50)

## 2017-01-28 NOTE — Progress Notes (Signed)
Order placed for f/u labs.  

## 2017-01-28 NOTE — Progress Notes (Signed)
Pre-visit discussion using our clinic review tool. No additional management support is needed unless otherwise documented below in the visit note.  

## 2017-01-28 NOTE — Progress Notes (Signed)
Patient ID: Troy Lopez, male   DOB: 01/09/1941, 76 y.o.   MRN: [id number]   Subjective:    Patient ID: Troy Lopez, male    DOB: August 15, 1941, 76 y.o.   MRN: [id number]  HPI  Patient here for a scheduled follow up.  He is s/p kyphoplasty.  Also had facet cortisone injection.  States this helped for approximately two days.  Still with some pain.  If off tramadol.  Was concerned this was interfering with his zoloft.  He is back on zoloft now.  Feels some better.  Initially stated has not been sleeping well, but on questioning, he sleeps approximately 6 hours per night.  Feels nervous at times.  Worse in am and evening.  Better through the day.  Just had bone marrow biopsy.  Diagnosed with smoldering myeloma.  States may have been a little anxious about this.  No chest pain.  Saw cardiology 12/03/16.  Breathing stable.  Still drinking approximately 6 beers per day.  Discussed the need to decrease his alcohol intake.     Past Medical History:  Diagnosis Date  . Adrenal gland anomaly    enlargement  . CAD (coronary artery disease)   . Carpal tunnel syndrome   . Colonic polyp   . COPD (chronic obstructive pulmonary disease) ([place])   . Degenerative disc disease, lumbar   . Depression   . Diverticulosis   . Dyspnea   . GERD (gastroesophageal reflux disease)   . Hypercholesterolemia   . Hyperkalemia   . Hypertension   . Irritable bowel syndrome   . Monoclonal gammopathy   . Neuropathy   . Personal history of tobacco use, presenting hazards to health 08/17/2015  . Sleep apnea    Past Surgical History:  Procedure Laterality Date  . BUNIONECTOMY  1989  . CARPAL TUNNEL RELEASE Left 2011   ulnar nerve sub muscular at elbow  . CHOLECYSTECTOMY  09/07  . ELECTROPHYSIOLOGIC STUDY N/A 11/15/2015   Procedure: CARDIOVERSION;  Surgeon: Isaias Cowman, MD;  Location: [facility] ORS;  Service: Cardiovascular;  Laterality: N/A;  . EYE SURGERY Bilateral 2010   cataract  . HEMORRHOID SURGERY    .  KYPHOPLASTY N/A 11/04/2016   Procedure: KYPHOPLASTY T 12;  Surgeon: Hessie Knows, MD;  Location: [facility] ORS;  Service: Orthopedics;  Laterality: N/A;   Family History  Problem Relation Age of Onset  . Stroke Mother   . Heart disease Father        MI - 84   . Colon cancer Neg Hx   . Prostate cancer Neg Hx    Social History   Social History  . Marital status: Married    Spouse name: N/A  . Number of children: 3  . Years of education: N/A   Social History Main Topics  . Smoking status: Former Smoker    Quit date: 11/01/2015  . Smokeless tobacco: Never Used     Comment: about 2 cigarettes per day, trying to quit  . Alcohol use 25.2 oz/week    42 Cans of beer per week     Comment: occas  . Drug use: No  . Sexual activity: Not Asked   Other Topics Concern  . None   Social History Narrative  . None    Outpatient Encounter Prescriptions as of 01/28/2017  Medication Sig  . acetaminophen (TYLENOL) 500 MG tablet Take 500-1,000 mg by mouth 3 (three) times daily as needed for moderate pain or headache.  . albuterol (PROVENTIL HFA;VENTOLIN HFA) 108 (  90 Base) MCG/ACT inhaler Inhale 2 puffs into the lungs every 6 (six) hours as needed for wheezing or shortness of breath.  Marland Kitchen amLODipine (NORVASC) 10 MG tablet Take 1 tablet (5 mg total) by mouth daily.  . budesonide (ENTOCORT EC) 3 MG 24 hr capsule Take 9 mg by mouth daily as needed (colitis flare).   Marland Kitchen ELIQUIS 5 MG TABS tablet Take 5 mg by mouth 2 (two) times daily.   Marland Kitchen FIBER PO Take 2 capsules by mouth daily.  . fluticasone (FLONASE) 50 MCG/ACT nasal spray Place 2 sprays into both nostrils daily.  . fluticasone furoate-vilanterol (BREO ELLIPTA) 100-25 MCG/INH AEPB Inhale 1 puff into the lungs daily.  . furosemide (LASIX) 20 MG tablet Take 1 tablet (20 mg total) by mouth daily as needed. (Patient taking differently: Take 13ms once daily)  . gabapentin (NEURONTIN) 100 MG capsule Take two each morning, two each afternoon and three at bedtime  (Patient taking differently: 3 (three) times daily as needed. Take two each morning, two each afternoon and three at bedtime as needed for foot pain)  . isosorbide mononitrate (IMDUR) 30 MG 24 hr tablet Take 30 mg by mouth daily.   .Marland Kitchenlosartan (COZAAR) 100 MG tablet Take 1 tablet (100 mg total) by mouth daily.  . metoprolol tartrate (LOPRESSOR) 100 MG tablet Take by mouth.  . ondansetron (ZOFRAN) 8 MG tablet Take by mouth.  . pantoprazole (PROTONIX) 40 MG tablet Take 1 tablet (40 mg total) by mouth 2 (two) times daily.  . sertraline (ZOLOFT) 50 MG tablet Take 1 tablet (50 mg total) by mouth daily.  . sodium chloride (OCEAN) 0.65 % SOLN nasal spray Place 2 sprays into both nostrils as needed (dryness).  . vitamin B-12 (CYANOCOBALAMIN) 1000 MCG tablet Take 1,000 mcg by mouth daily.   . mupirocin ointment (BACTROBAN) 2 % Place 1 application into the nose 2 (two) times daily. (Patient not taking: Reported on 01/28/2017)   No facility-administered encounter medications on file as of 01/28/2017.     Review of Systems  Constitutional: Negative for appetite change and unexpected weight change.  HENT: Negative for congestion and sinus pressure.   Respiratory: Negative for cough, chest tightness and shortness of breath.   Cardiovascular: Negative for chest pain, palpitations and leg swelling.  Gastrointestinal: Negative for abdominal pain, nausea and vomiting.  Genitourinary: Negative for difficulty urinating and dysuria.  Musculoskeletal: Positive for back pain. Negative for joint swelling.  Skin: Negative for color change and rash.  Neurological: Negative for dizziness and headaches.  Psychiatric/Behavioral: Negative for agitation.       Back on zoloft.  States does not feel as depressed as he did.         Objective:    Physical Exam  Constitutional: He appears well-developed and well-nourished. No distress.  HENT:  Nose: Nose normal.  Mouth/Throat: Oropharynx is clear and moist.  Right ear  - cerumen impaction.    Neck: Neck supple.  Cardiovascular: Normal rate and regular rhythm.   Pulmonary/Chest: Effort normal and breath sounds normal. No respiratory distress.  Abdominal: Soft. Bowel sounds are normal. There is no tenderness.  Musculoskeletal: He exhibits no edema or tenderness.  Lymphadenopathy:    He has no cervical adenopathy.  Skin: No rash noted. No erythema.  Psychiatric: He has a normal mood and affect. His behavior is normal.    BP 130/76 (BP Location: Left Arm, Patient Position: Sitting, Cuff Size: Normal)   Pulse 68   Temp 98.6 F (37 C) (  Oral)   Resp 14   Ht '5\' 11"'  (1.803 m)   Wt 186 lb 3.2 oz (84.5 kg)   SpO2 94%   BMI 25.97 kg/m  Wt Readings from Last 3 Encounters:  01/28/17 186 lb 3.2 oz (84.5 kg)  01/24/17 186 lb 8.2 oz (84.6 kg)  01/03/17 188 lb 15 oz (85.7 kg)     Lab Results  Component Value Date   WBC 6.8 01/08/2017   HGB 10.9 (L) 01/08/2017   HCT 31.8 (L) 01/08/2017   PLT 426 01/08/2017   GLUCOSE 97 01/28/2017   CHOL 134 02/14/2016   TRIG 42.0 02/14/2016   HDL 74.80 02/14/2016   LDLCALC 51 02/14/2016   ALT 14 (L) 12/27/2016   AST 19 12/27/2016   NA 124 (L) 01/30/2017   K 4.8 01/30/2017   CL 93 (L) 01/28/2017   CREATININE 1.08 01/28/2017   BUN 15 01/28/2017   CO2 25 01/28/2017   TSH 1.51 01/28/2017   PSA 1.33 02/14/2016   INR 1.11 01/08/2017   HGBA1C 5.7 03/04/2016    Ct Biopsy  Result Date: 01/08/2017 INDICATION: Monoclonal gammopathy EXAM: CT BIOPSY MEDICATIONS: None. ANESTHESIA/SEDATION: Fentanyl 75 mcg IV; Versed 2 mg IV Moderate Sedation Time:  14 The patient was continuously monitored during the procedure by the interventional radiology nurse under my direct supervision. FLUOROSCOPY TIME:  None. COMPLICATIONS: None immediate. PROCEDURE: Informed written consent was obtained from the patient after a thorough discussion of the procedural risks, benefits and alternatives. All questions were addressed. Maximal Sterile  Barrier Technique was utilized including caps, mask, sterile gowns, sterile gloves, sterile drape, hand hygiene and skin antiseptic. A timeout was performed prior to the initiation of the procedure. Under CT guidance, an 11 gauge needle was inserted into the right iliac bone via posterior approach. Aspirates and a core were obtained. Patient tolerated the procedure well without complication. Vital sign monitoring by nursing staff during the procedure will continue as patient is in the special procedures unit for post procedure observation. FINDINGS: The images document guide needle placement within the right iliac bone. IMPRESSION: Successful right iliac bone marrow aspirate and core. Electronically Signed   By: Marybelle Killings M.D.   On: 01/08/2017 11:27       Assessment & Plan:   Problem List Items Addressed This Visit    Alcohol abuse    Drinks 6 beers per day.  Discussed the need to decrease the dose.  Follow.        CAD (coronary artery disease)    Followed by cardiology.  Stable.  Last seen 11/2016.  Continue risk factor modification.        Chronic back pain    Off tramadol.  Stable.        Collagenous colitis    Overall stable.  Follow.        Depression    On zoloft.  Feels some better.  Feels nervous at times.  Back on zoloft now.  Follow.  Hold on making changes in his medication.        GERD (gastroesophageal reflux disease)    Controlled on protonix.        History of compression fracture of spine    S/p kyphoplasty.  Followed by ortho.  Saw endocrinology for osteoporosis management.        Hypertension    Blood pressure under good control.  Continue same medication regimen.  Follow pressures.  Follow metabolic panel.        Hyponatremia -  Primary    Worsened recently.  Has previously been worked up by nephrology.  Decrease alcohol intake.  Follow.  Recheck today.       Relevant Orders   Basic metabolic panel (Completed)   TSH (Completed)   MGUS (monoclonal  gammopathy of unknown significance)    Evaluated by hematology.  Just had bone marrow biopsy.  Question of smoldering myeloma.  Follow.        Moderate COPD (chronic obstructive pulmonary disease) ([facility])    Breathing stable.  Continue f/u with pulmonary.        Other Visit Diagnoses    Impacted cerumen of right ear       debrox.  return for ear irrigation.         Einar Pheasant, MD

## 2017-01-30 ENCOUNTER — Encounter: Payer: Self-pay | Admitting: Internal Medicine

## 2017-01-30 ENCOUNTER — Other Ambulatory Visit: Payer: Self-pay | Admitting: Internal Medicine

## 2017-01-30 ENCOUNTER — Other Ambulatory Visit (INDEPENDENT_AMBULATORY_CARE_PROVIDER_SITE_OTHER): Payer: PPO

## 2017-01-30 DIAGNOSIS — E871 Hypo-osmolality and hyponatremia: Secondary | ICD-10-CM

## 2017-01-30 DIAGNOSIS — F1010 Alcohol abuse, uncomplicated: Secondary | ICD-10-CM | POA: Insufficient documentation

## 2017-01-30 DIAGNOSIS — F1020 Alcohol dependence, uncomplicated: Secondary | ICD-10-CM | POA: Insufficient documentation

## 2017-01-30 DIAGNOSIS — E875 Hyperkalemia: Secondary | ICD-10-CM

## 2017-01-30 LAB — SODIUM: SODIUM: 124 meq/L — AB (ref 135–145)

## 2017-01-30 LAB — POTASSIUM: POTASSIUM: 4.8 meq/L (ref 3.5–5.1)

## 2017-01-30 LAB — CHROMOSOME ANALYSIS, BONE MARROW

## 2017-01-30 NOTE — Assessment & Plan Note (Signed)
Blood pressure under good control.  Continue same medication regimen.  Follow pressures.  Follow metabolic panel.   

## 2017-01-30 NOTE — Assessment & Plan Note (Signed)
Controlled on protonix.   

## 2017-01-30 NOTE — Assessment & Plan Note (Signed)
Off tramadol.  Stable.

## 2017-01-30 NOTE — Assessment & Plan Note (Signed)
Overall stable.  Follow.  

## 2017-01-30 NOTE — Assessment & Plan Note (Signed)
Breathing stable.  Continue f/u with pulmonary.   

## 2017-01-30 NOTE — Progress Notes (Signed)
Order placed for nephrology referral.   °

## 2017-01-30 NOTE — Assessment & Plan Note (Signed)
Worsened recently.  Has previously been worked up by nephrology.  Decrease alcohol intake.  Follow.  Recheck today.

## 2017-01-30 NOTE — Assessment & Plan Note (Signed)
Evaluated by hematology.  Just had bone marrow biopsy.  Question of smoldering myeloma.  Follow.

## 2017-01-30 NOTE — Assessment & Plan Note (Signed)
On zoloft.  Feels some better.  Feels nervous at times.  Back on zoloft now.  Follow.  Hold on making changes in his medication.

## 2017-01-30 NOTE — Assessment & Plan Note (Signed)
Followed by cardiology.  Stable.  Last seen 11/2016.  Continue risk factor modification.

## 2017-01-30 NOTE — Assessment & Plan Note (Signed)
Drinks 6 beers per day.  Discussed the need to decrease the dose.  Follow.

## 2017-01-30 NOTE — Assessment & Plan Note (Signed)
S/p kyphoplasty.  Followed by ortho.  Saw endocrinology for osteoporosis management.

## 2017-01-31 ENCOUNTER — Encounter: Payer: Self-pay | Admitting: Internal Medicine

## 2017-01-31 DIAGNOSIS — C880 Waldenstrom macroglobulinemia: Secondary | ICD-10-CM | POA: Insufficient documentation

## 2017-02-05 ENCOUNTER — Ambulatory Visit (INDEPENDENT_AMBULATORY_CARE_PROVIDER_SITE_OTHER): Payer: PPO

## 2017-02-05 DIAGNOSIS — H6121 Impacted cerumen, right ear: Secondary | ICD-10-CM

## 2017-02-05 NOTE — Progress Notes (Signed)
Patient comes in today for ear impaction in the right ear. States that he needs bilateral ear impaction because of his hearing aids causing him to be impacted. Patient states he notices it when he hears feed back from his hearing aid. Liquid softner was place in each ear to help release the impacted cerumen.Patient tolerated procedure well. No complaints of any dizziness, or ear pain. Possible ENT will be needed for further evaluation.   Per discussion with CMA he was not able to get all the wax out of patients ears.  Please advise, thanks in the absence of PCP.

## 2017-02-06 NOTE — Progress Notes (Signed)
This was sent to me.  I don't think I can sign off since I was not in the office, but if persistent ear wax, etc - can refer to ENT for evaluation.  Let me know if agrees and I will send order for referral.

## 2017-02-24 ENCOUNTER — Other Ambulatory Visit: Payer: Self-pay | Admitting: Internal Medicine

## 2017-02-25 ENCOUNTER — Telehealth: Payer: Self-pay | Admitting: Internal Medicine

## 2017-02-25 DIAGNOSIS — I10 Essential (primary) hypertension: Secondary | ICD-10-CM | POA: Diagnosis not present

## 2017-02-25 DIAGNOSIS — E871 Hypo-osmolality and hyponatremia: Secondary | ICD-10-CM | POA: Diagnosis not present

## 2017-02-25 NOTE — Telephone Encounter (Signed)
Pt declined AWV. °

## 2017-02-26 ENCOUNTER — Other Ambulatory Visit: Payer: Self-pay | Admitting: Nephrology

## 2017-02-26 DIAGNOSIS — Z87891 Personal history of nicotine dependence: Secondary | ICD-10-CM

## 2017-02-26 DIAGNOSIS — E871 Hypo-osmolality and hyponatremia: Secondary | ICD-10-CM

## 2017-03-03 ENCOUNTER — Observation Stay
Admission: EM | Admit: 2017-03-03 | Discharge: 2017-03-05 | Disposition: A | Discharge status: Home / Self Care | Payer: PPO | Attending: Internal Medicine | Admitting: Internal Medicine

## 2017-03-03 ENCOUNTER — Encounter: Payer: Self-pay | Admitting: *Deleted

## 2017-03-03 ENCOUNTER — Emergency Department: Payer: PPO

## 2017-03-03 DIAGNOSIS — R195 Other fecal abnormalities: Secondary | ICD-10-CM | POA: Diagnosis not present

## 2017-03-03 DIAGNOSIS — K222 Esophageal obstruction: Secondary | ICD-10-CM | POA: Insufficient documentation

## 2017-03-03 DIAGNOSIS — Z79899 Other long term (current) drug therapy: Secondary | ICD-10-CM | POA: Insufficient documentation

## 2017-03-03 DIAGNOSIS — C880 Waldenstrom macroglobulinemia: Secondary | ICD-10-CM | POA: Insufficient documentation

## 2017-03-03 DIAGNOSIS — K641 Second degree hemorrhoids: Secondary | ICD-10-CM | POA: Insufficient documentation

## 2017-03-03 DIAGNOSIS — Z7901 Long term (current) use of anticoagulants: Secondary | ICD-10-CM | POA: Diagnosis not present

## 2017-03-03 DIAGNOSIS — Z9889 Other specified postprocedural states: Secondary | ICD-10-CM | POA: Insufficient documentation

## 2017-03-03 DIAGNOSIS — D500 Iron deficiency anemia secondary to blood loss (chronic): Secondary | ICD-10-CM | POA: Insufficient documentation

## 2017-03-03 DIAGNOSIS — G5600 Carpal tunnel syndrome, unspecified upper limb: Secondary | ICD-10-CM | POA: Insufficient documentation

## 2017-03-03 DIAGNOSIS — F419 Anxiety disorder, unspecified: Secondary | ICD-10-CM | POA: Diagnosis not present

## 2017-03-03 DIAGNOSIS — K449 Diaphragmatic hernia without obstruction or gangrene: Secondary | ICD-10-CM | POA: Insufficient documentation

## 2017-03-03 DIAGNOSIS — R079 Chest pain, unspecified: Secondary | ICD-10-CM | POA: Diagnosis present

## 2017-03-03 DIAGNOSIS — I4892 Unspecified atrial flutter: Secondary | ICD-10-CM | POA: Insufficient documentation

## 2017-03-03 DIAGNOSIS — F329 Major depressive disorder, single episode, unspecified: Secondary | ICD-10-CM | POA: Diagnosis not present

## 2017-03-03 DIAGNOSIS — E7800 Pure hypercholesterolemia, unspecified: Secondary | ICD-10-CM | POA: Insufficient documentation

## 2017-03-03 DIAGNOSIS — F1010 Alcohol abuse, uncomplicated: Secondary | ICD-10-CM | POA: Insufficient documentation

## 2017-03-03 DIAGNOSIS — K5730 Diverticulosis of large intestine without perforation or abscess without bleeding: Secondary | ICD-10-CM | POA: Insufficient documentation

## 2017-03-03 DIAGNOSIS — I2510 Atherosclerotic heart disease of native coronary artery without angina pectoris: Secondary | ICD-10-CM | POA: Insufficient documentation

## 2017-03-03 DIAGNOSIS — R05 Cough: Secondary | ICD-10-CM | POA: Diagnosis not present

## 2017-03-03 DIAGNOSIS — E279 Disorder of adrenal gland, unspecified: Secondary | ICD-10-CM | POA: Insufficient documentation

## 2017-03-03 DIAGNOSIS — Z8249 Family history of ischemic heart disease and other diseases of the circulatory system: Secondary | ICD-10-CM | POA: Insufficient documentation

## 2017-03-03 DIAGNOSIS — K922 Gastrointestinal hemorrhage, unspecified: Secondary | ICD-10-CM

## 2017-03-03 DIAGNOSIS — G4733 Obstructive sleep apnea (adult) (pediatric): Secondary | ICD-10-CM | POA: Insufficient documentation

## 2017-03-03 DIAGNOSIS — Z86010 Personal history of colonic polyps: Secondary | ICD-10-CM | POA: Insufficient documentation

## 2017-03-03 DIAGNOSIS — K921 Melena: Principal | ICD-10-CM

## 2017-03-03 DIAGNOSIS — K621 Rectal polyp: Secondary | ICD-10-CM

## 2017-03-03 DIAGNOSIS — J449 Chronic obstructive pulmonary disease, unspecified: Secondary | ICD-10-CM | POA: Insufficient documentation

## 2017-03-03 DIAGNOSIS — I10 Essential (primary) hypertension: Secondary | ICD-10-CM | POA: Insufficient documentation

## 2017-03-03 DIAGNOSIS — Z9841 Cataract extraction status, right eye: Secondary | ICD-10-CM | POA: Insufficient documentation

## 2017-03-03 DIAGNOSIS — Z87891 Personal history of nicotine dependence: Secondary | ICD-10-CM | POA: Insufficient documentation

## 2017-03-03 DIAGNOSIS — Z7951 Long term (current) use of inhaled steroids: Secondary | ICD-10-CM | POA: Insufficient documentation

## 2017-03-03 DIAGNOSIS — Z91041 Radiographic dye allergy status: Secondary | ICD-10-CM | POA: Diagnosis not present

## 2017-03-03 DIAGNOSIS — Z9842 Cataract extraction status, left eye: Secondary | ICD-10-CM | POA: Insufficient documentation

## 2017-03-03 DIAGNOSIS — D472 Monoclonal gammopathy: Secondary | ICD-10-CM | POA: Insufficient documentation

## 2017-03-03 DIAGNOSIS — K589 Irritable bowel syndrome without diarrhea: Secondary | ICD-10-CM | POA: Diagnosis not present

## 2017-03-03 DIAGNOSIS — R0789 Other chest pain: Secondary | ICD-10-CM | POA: Diagnosis not present

## 2017-03-03 DIAGNOSIS — I4891 Unspecified atrial fibrillation: Secondary | ICD-10-CM | POA: Diagnosis not present

## 2017-03-03 DIAGNOSIS — K219 Gastro-esophageal reflux disease without esophagitis: Secondary | ICD-10-CM | POA: Insufficient documentation

## 2017-03-03 DIAGNOSIS — K2970 Gastritis, unspecified, without bleeding: Secondary | ICD-10-CM | POA: Insufficient documentation

## 2017-03-03 DIAGNOSIS — Z823 Family history of stroke: Secondary | ICD-10-CM | POA: Insufficient documentation

## 2017-03-03 DIAGNOSIS — Z9049 Acquired absence of other specified parts of digestive tract: Secondary | ICD-10-CM | POA: Insufficient documentation

## 2017-03-03 LAB — BASIC METABOLIC PANEL
Anion gap: 10 (ref 5–15)
BUN: 14 mg/dL (ref 6–20)
CALCIUM: 9.3 mg/dL (ref 8.9–10.3)
CO2: 23 mmol/L (ref 22–32)
CREATININE: 0.94 mg/dL (ref 0.61–1.24)
Chloride: 101 mmol/L (ref 101–111)
GFR calc Af Amer: >60 mL/min (ref >60)
Glucose, Bld: 100 mg/dL — ABNORMAL HIGH (ref 65–99)
POTASSIUM: 4.3 mmol/L (ref 3.5–5.1)
SODIUM: 134 mmol/L — AB (ref 135–145)

## 2017-03-03 LAB — CBC
HCT: 28.5 % — ABNORMAL LOW (ref 40.0–52.0)
Hemoglobin: 9.8 g/dL — ABNORMAL LOW (ref 13.0–18.0)
MCH: 32.7 pg (ref 26.0–34.0)
MCHC: 34.3 g/dL (ref 32.0–36.0)
MCV: 95.2 fL (ref 80.0–100.0)
Platelets: 431 10*3/uL (ref 150–440)
RBC: 3 MIL/uL — ABNORMAL LOW (ref 4.40–5.90)
RDW: 15 % — AB (ref 11.5–14.5)
WBC: 10.4 10*3/uL (ref 3.8–10.6)

## 2017-03-03 LAB — TROPONIN I

## 2017-03-03 MED ORDER — PANTOPRAZOLE SODIUM 40 MG PO TBEC
40.0000 mg | DELAYED_RELEASE_TABLET | Freq: Every day | ORAL | Status: DC
  Administered 2017-03-03 – 2017-03-04 (×2): 40 mg via ORAL
  Filled 2017-03-03 (×2): qty 1

## 2017-03-03 MED ORDER — ADULT MULTIVITAMIN W/MINERALS CH
1.0000 | ORAL_TABLET | Freq: Every day | ORAL | Status: DC
  Administered 2017-03-04 – 2017-03-05 (×2): 1 via ORAL
  Filled 2017-03-03 (×2): qty 1

## 2017-03-03 MED ORDER — FLUTICASONE PROPIONATE 50 MCG/ACT NA SUSP
2.0000 | Freq: Every day | NASAL | Status: DC
  Administered 2017-03-03 – 2017-03-05 (×3): 2 via NASAL
  Filled 2017-03-03: qty 16

## 2017-03-03 MED ORDER — IPRATROPIUM-ALBUTEROL 0.5-2.5 (3) MG/3ML IN SOLN
3.0000 mL | Freq: Once | RESPIRATORY_TRACT | Status: AC
  Administered 2017-03-03: 3 mL via RESPIRATORY_TRACT
  Filled 2017-03-03: qty 3

## 2017-03-03 MED ORDER — VITAMIN B-12 1000 MCG PO TABS
1000.0000 ug | ORAL_TABLET | Freq: Every day | ORAL | Status: DC
  Administered 2017-03-03 – 2017-03-05 (×3): 1000 ug via ORAL
  Filled 2017-03-03 (×3): qty 1

## 2017-03-03 MED ORDER — GABAPENTIN 100 MG PO CAPS
100.0000 mg | ORAL_CAPSULE | Freq: Three times a day (TID) | ORAL | Status: DC
  Administered 2017-03-03 – 2017-03-05 (×6): 100 mg via ORAL
  Filled 2017-03-03 (×6): qty 1

## 2017-03-03 MED ORDER — ACETAMINOPHEN 500 MG PO TABS
500.0000 mg | ORAL_TABLET | Freq: Three times a day (TID) | ORAL | Status: DC | PRN
  Administered 2017-03-03 – 2017-03-04 (×3): 1000 mg via ORAL
  Filled 2017-03-03 (×3): qty 2

## 2017-03-03 MED ORDER — SERTRALINE HCL 50 MG PO TABS
50.0000 mg | ORAL_TABLET | Freq: Every day | ORAL | Status: DC
  Administered 2017-03-03 – 2017-03-05 (×3): 50 mg via ORAL
  Filled 2017-03-03 (×3): qty 1

## 2017-03-03 MED ORDER — ALBUTEROL SULFATE (2.5 MG/3ML) 0.083% IN NEBU: 3.0000 mL | INHALATION_SOLUTION | Freq: Four times a day (QID) | RESPIRATORY_TRACT | Status: DC | PRN

## 2017-03-03 MED ORDER — SODIUM CHLORIDE 0.9% FLUSH
3.0000 mL | Freq: Two times a day (BID) | INTRAVENOUS | Status: DC
  Administered 2017-03-03 – 2017-03-05 (×3): 3 mL via INTRAVENOUS

## 2017-03-03 MED ORDER — AMLODIPINE BESYLATE 5 MG PO TABS
5.0000 mg | ORAL_TABLET | Freq: Every day | ORAL | Status: DC
  Administered 2017-03-03 – 2017-03-05 (×3): 5 mg via ORAL
  Filled 2017-03-03 (×3): qty 1

## 2017-03-03 MED ORDER — SODIUM CHLORIDE 0.9 % IV SOLN: 250.0000 mL | INTRAVENOUS | Status: DC | PRN

## 2017-03-03 MED ORDER — ACETAMINOPHEN 650 MG RE SUPP: 650.0000 mg | Freq: Four times a day (QID) | RECTAL | Status: DC | PRN

## 2017-03-03 MED ORDER — ZOLPIDEM TARTRATE 5 MG PO TABS
5.0000 mg | ORAL_TABLET | Freq: Once | ORAL | Status: AC
  Administered 2017-03-04: 5 mg via ORAL
  Filled 2017-03-03: qty 1

## 2017-03-03 MED ORDER — ASPIRIN 81 MG PO CHEW
243.0000 mg | CHEWABLE_TABLET | Freq: Once | ORAL | Status: AC
  Administered 2017-03-03: 243 mg via ORAL
  Filled 2017-03-03: qty 3

## 2017-03-03 MED ORDER — FLUTICASONE FUROATE-VILANTEROL 100-25 MCG/INH IN AEPB
1.0000 | INHALATION_SPRAY | Freq: Every day | RESPIRATORY_TRACT | Status: DC
  Administered 2017-03-04 – 2017-03-05 (×2): 1 via RESPIRATORY_TRACT
  Filled 2017-03-03: qty 28

## 2017-03-03 MED ORDER — FIBER PO POWD: Freq: Every day | ORAL | Status: DC

## 2017-03-03 MED ORDER — LOSARTAN POTASSIUM 50 MG PO TABS
100.0000 mg | ORAL_TABLET | Freq: Every day | ORAL | Status: DC
  Administered 2017-03-03 – 2017-03-05 (×3): 100 mg via ORAL
  Filled 2017-03-03 (×3): qty 2

## 2017-03-03 MED ORDER — METOPROLOL TARTRATE 50 MG PO TABS
50.0000 mg | ORAL_TABLET | Freq: Two times a day (BID) | ORAL | Status: DC
  Administered 2017-03-03 – 2017-03-05 (×4): 50 mg via ORAL
  Filled 2017-03-03 (×4): qty 1

## 2017-03-03 MED ORDER — ISOSORBIDE MONONITRATE ER 30 MG PO TB24
30.0000 mg | ORAL_TABLET | Freq: Every day | ORAL | Status: DC
  Administered 2017-03-04 – 2017-03-05 (×2): 30 mg via ORAL
  Filled 2017-03-03 (×3): qty 1

## 2017-03-03 MED ORDER — ACETAMINOPHEN 325 MG PO TABS
650.0000 mg | ORAL_TABLET | Freq: Four times a day (QID) | ORAL | Status: DC | PRN
  Administered 2017-03-04 – 2017-03-05 (×3): 650 mg via ORAL
  Filled 2017-03-03 (×3): qty 2

## 2017-03-03 MED ORDER — SODIUM CHLORIDE 0.9% FLUSH: 3.0000 mL | INTRAVENOUS | Status: DC | PRN

## 2017-03-03 MED ORDER — PSYLLIUM 95 % PO PACK
1.0000 | PACK | Freq: Every day | ORAL | Status: DC
  Administered 2017-03-04: 1 via ORAL
  Filled 2017-03-03 (×3): qty 1

## 2017-03-03 MED ORDER — BUDESONIDE 3 MG PO CPEP
9.0000 mg | ORAL_CAPSULE | Freq: Every day | ORAL | Status: DC | PRN
  Filled 2017-03-03: qty 3

## 2017-03-03 NOTE — ED Provider Notes (Signed)
[facility] Emergency Department Provider Note  ____________________________________________  Time seen: Approximately 7:41 AM  I have reviewed the triage vital signs and the nursing notes.   HISTORY  Chief Complaint Chest Pain   HPI FREDDY KINNE is a 76 y.o. male history of CAD (last LHC in 2012 showing 60% occlusion of the RCA), paroxysmal atrial flutter on Eliquis, COPD, obstructive sleep apnea, hypertension hyperlipidemia who presents for evaluation of chest pain. Patient reports that the pain woke him up from his sleep at 5 AM. The pain initially was sharp, moderate to severe, located in the right side of his chest, pleuritic in nature. Patient reports that the pain is similar to one prior episode where he had a broken rib. The pain lasted until he arrived to the emergency room and at this time has resolved. Patient reports that he has been coughing and has had worsening in his baseline cough. He feels that the cough is productive but he is unable to bring anything up. No hemoptysis. No fever or chills, nausea or vomiting. Patient no longer smokes. He did not try his inhaler this morning. Patient denies diaphoresis, nausea, vomiting, dizziness or shortness of breath associated with this chest pain episode. No personal or family history of blood clots, no recent travel or immobilization, no hemoptysis, no leg pain or swelling.  Past Medical History:  Diagnosis Date  . Adrenal gland anomaly    enlargement  . CAD (coronary artery disease)   . Carpal tunnel syndrome   . Colonic polyp   . COPD (chronic obstructive pulmonary disease) ([place])   . Degenerative disc disease, lumbar   . Depression   . Diverticulosis   . Dyspnea   . GERD (gastroesophageal reflux disease)   . Hypercholesterolemia   . Hyperkalemia   . Hypertension   . Irritable bowel syndrome   . Monoclonal gammopathy   . Neuropathy   . Personal history of tobacco use, presenting hazards to  health 08/17/2015  . Sleep apnea     Patient Active Problem List   Diagnosis Date Noted  . 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 Alcohol abuse 01/30/2017  . History of compression fracture of spine 12/08/2016  . Moderate COPD (chronic obstructive pulmonary disease) ([place]) 07/22/2016  . COPD exacerbation ([place]) 07/22/2016  . Depression 07/21/2016  . Skin lesion 01/21/2016  . Sleep difficulties 09/26/2015  . Personal history of tobacco use, presenting hazards to health 08/17/2015  . Health care maintenance 10/23/2014  . Collagenous colitis 09/16/2014  . Leg pain 03/20/2014  . SOB (shortness of breath) 11/21/2013  . Ulcer 02/02/2013  . Chronic back pain 02/02/2013  . GERD (gastroesophageal reflux disease) 09/13/2012  . Sleep apnea 09/13/2012  . Iron deficiency anemia 09/08/2012  . 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 CAD (coronary artery disease) 09/08/2012  . Hypertension 09/08/2012  . Hyponatremia 09/08/2012    Past Surgical History:  Procedure Laterality Date  . BUNIONECTOMY  1989  . CARPAL TUNNEL RELEASE Left 2011   ulnar nerve sub muscular at elbow  . CHOLECYSTECTOMY  09/07  . ELECTROPHYSIOLOGIC STUDY N/A 11/15/2015   Procedure: CARDIOVERSION;  Surgeon: Isaias Cowman, MD;  Location: [facility] ORS;  Service: Cardiovascular;  Laterality: N/A;  . EYE SURGERY Bilateral 2010   cataract  . HEMORRHOID SURGERY    . KYPHOPLASTY N/A 11/04/2016   Procedure: KYPHOPLASTY T 12;  Surgeon: Hessie Knows, MD;  Location: [facility] ORS;  Service: Orthopedics;  Laterality: N/A;    Prior  to Admission medications   Medication Sig Start Date End Date Taking? Authorizing Provider  acetaminophen (TYLENOL) 500 MG tablet Take 500-1,000 mg by mouth 3 (three) times daily as needed for moderate pain or headache.   Yes [provider]  albuterol (PROVENTIL HFA;VENTOLIN HFA) 108 (90 Base) MCG/ACT inhaler Inhale 2 puffs into the lungs every 6 (six) hours  as needed for wheezing or shortness of breath.   Yes [provider]  alendronate (FOSAMAX) 70 MG tablet Take 70 mg by mouth once a week. Take with a full glass of water on an empty stomach. Takes on Sundays   Yes [provider]  amLODipine (NORVASC) 10 MG tablet Take 1 tablet (5 mg total) by mouth daily. 01/22/17  Yes Einar Pheasant, MD  budesonide (ENTOCORT EC) 3 MG 24 hr capsule Take 9 mg by mouth daily as needed (colitis flare).  05/31/15  Yes [provider]  ELIQUIS 5 MG TABS tablet Take 5 mg by mouth 2 (two) times daily.  05/23/16  Yes [provider]  fluticasone (FLONASE) 50 MCG/ACT nasal spray Place 2 sprays into both nostrils daily. 12/06/16  Yes Einar Pheasant, MD  fluticasone furoate-vilanterol (BREO ELLIPTA) 100-25 MCG/INH AEPB Inhale 1 puff into the lungs daily. 05/17/16  Yes Wilhelmina [name], MD  gabapentin (NEURONTIN) 100 MG capsule Take two each morning, two each afternoon and three at bedtime Patient taking differently: 3 (three) times daily as needed. Take two each morning, two each afternoon and three at bedtime as needed for foot pain 09/10/16  Yes Einar Pheasant, MD  isosorbide mononitrate (IMDUR) 30 MG 24 hr tablet Take 30 mg by mouth daily.  07/21/15 03/03/18 Yes [provider]  losartan (COZAAR) 100 MG tablet Take 1 tablet (100 mg total) by mouth daily. 01/22/17  Yes Einar Pheasant, MD  metoprolol tartrate (LOPRESSOR) 100 MG tablet Take 50 mg by mouth 2 (two) times daily.    Yes [provider]  Multiple Vitamin (MULTIVITAMIN WITH MINERALS) TABS tablet Take 1 tablet by mouth daily.   Yes [provider]  pantoprazole (PROTONIX) 40 MG tablet Take 1 tablet (40 mg total) by mouth 2 (two) times daily. 02/24/17  Yes Einar Pheasant, MD  sertraline (ZOLOFT) 50 MG tablet Take 1 tablet (50 mg total) by mouth daily. 12/26/16  Yes Einar Pheasant, MD  vitamin B-12 (CYANOCOBALAMIN) 1000 MCG tablet Take 1,000 mcg by mouth daily.     Yes [provider]  FIBER PO Take 2 capsules by mouth daily.    [provider]  furosemide (LASIX) 20 MG tablet Take 1 tablet (20 mg total) by mouth daily as needed. Patient not taking: Reported on 03/03/2017 06/19/16   Einar Pheasant, MD  mupirocin ointment (BACTROBAN) 2 % Place 1 application into the nose 2 (two) times daily. Patient not taking: Reported on 01/28/2017 01/17/16   Einar Pheasant, MD    Allergies Ivp dye [iodinated diagnostic agents]  Family History  Problem Relation Age of Onset  . Stroke Mother   . Heart disease Father        MI - 73   . Colon cancer Neg Hx   . Prostate cancer Neg Hx     Social History Social History  Substance Use Topics  . Smoking status: Former Smoker    Quit date: 11/01/2015  . Smokeless tobacco: Never Used     Comment: about 2 cigarettes per day, trying to quit  . Alcohol use 25.2 oz/week    42 Cans  of beer per week     Comment: occas    Review of Systems  Constitutional: Negative for fever. Eyes: Negative for visual changes. ENT: Negative for sore throat. Neck: No neck pain  Cardiovascular: + chest pain. Respiratory: Negative for shortness of breath. Gastrointestinal: Negative for abdominal pain, vomiting or diarrhea. Genitourinary: Negative for dysuria. Musculoskeletal: Negative for back pain. Skin: Negative for rash. Neurological: Negative for headaches, weakness or numbness. Psych: No SI or HI  ____________________________________________   PHYSICAL EXAM:  VITAL SIGNS: ED Triage Vitals  Enc Vitals Group     BP 03/03/17 0612 (!) 150/80     Pulse Rate 03/03/17 0612 75     Resp 03/03/17 0612 18     Temp 03/03/17 0612 98.8 F (37.1 C)     Temp Source 03/03/17 0612 Oral     SpO2 03/03/17 0612 94 %     Weight 03/03/17 0612 190 lb (86.2 kg)     Height 03/03/17 0612 5\' 9"  (1.753 m)     Head Circumference --      Peak Flow --      Pain Score 03/03/17 0611 6     Pain Loc --      Pain Edu? --       Excl. in [place]? --     Constitutional: Alert and oriented. Well appearing and in no apparent distress. HEENT:      Head: Normocephalic and atraumatic.         Eyes: Conjunctivae are normal. Sclera is non-icteric.       Mouth/Throat: Mucous membranes are moist.       Neck: Supple with no signs of meningismus. Cardiovascular: Regular rate and rhythm. No murmurs, gallops, or rubs. 2+ symmetrical distal pulses are present in all extremities. No JVD. Respiratory: Normal respiratory effort. Lungs are clear to auscultation bilaterally with faint wheezing on the RLL.  Gastrointestinal: Soft, non tender, and non distended with positive bowel sounds. No rebound or guarding. Musculoskeletal: Nontender with normal range of motion in all extremities. No edema, cyanosis, or erythema of extremities. Neurologic: Normal speech and language. Face is symmetric. Moving all extremities. No gross focal neurologic deficits are appreciated. Skin: Skin is warm, dry and intact. No rash noted. Psychiatric: Mood and affect are normal. Speech and behavior are normal.  ____________________________________________   LABS (all labs ordered are listed, but only abnormal results are displayed)  Labs Reviewed  BASIC METABOLIC PANEL - Abnormal; Notable for the following:       Result Value   Sodium 134 (*)    Glucose, Bld 100 (*)    All other components within normal limits  CBC - Abnormal; Notable for the following:    RBC 3.00 (*)    Hemoglobin 9.8 (*)    HCT 28.5 (*)    RDW 15.0 (*)    All other components within normal limits  TROPONIN I  TROPONIN I   ____________________________________________  EKG  ED ECG REPORT I, Rudene Re, the attending physician, personally viewed and interpreted this ECG.  Normal sinus rhythm, rate of 73, right bundle branch block, normal QTC, normal axis, no ST elevations or depressions. Unchanged from  prior. ____________________________________________  RADIOLOGY  CXR: No evidence of acute cardiopulmonary disease.  Chronic scarring in the lungs bilaterally ____________________________________________   PROCEDURES  Procedure(s) performed: None Procedures Critical Care performed:  None ____________________________________________   INITIAL IMPRESSION / ASSESSMENT AND PLAN / ED COURSE  76 y.o. male history of CAD (last LHC in 2012  showing 60% occlusion of the RCA), paroxysmal atrial flutter on Eliquis, COPD, obstructive sleep apnea, hypertension hyperlipidemia who presents for evaluation of R sided chest pain which has now resolved. Patient is extremely well appearing, no respiratory distress, normal vital signs, his lung exam shows faint expiratory wheezes just on the right lower lobe with good air movement bilaterally. Differential diagnoses including pneumonia versus pneumothorax versus CAD versus bronchitis versus COPD exacerbation. I do believe less likely this is a pulmonary embolus due to patient's no risk factors other than his age and the fact the patient is anticoagulated on Eliquis. I will also expect that the pain would not go away if this was a pulmonary emboli. Will give one duoneb, full dose ASA, cycle cardiac enzymes, and monitor on telemetry.  Clinical Course as of Mar 03 1021  Mon Mar 03, 2017  0758 First troponin negative, patient remains pain free. Will continue to monitor and repeat trop in 3 hours  [CV]  0957 Patient noted to have slowly down trending hemoglobin since March currently 9.8 which is a point lower when compared to 2 months ago. Rectal exam showing brown stool that is guaiac positive concerning for slow GI bleed in the setting of Eliquis. Will admit to Hospitalist. Dr. Jerrye Beavers made aware.   [CV]    Clinical Course User Index [CV] Rudene Re, MD    Pertinent labs & imaging results that were available during my care of the patient were reviewed  by me and considered in my medical decision making (see chart for details).    ____________________________________________   FINAL CLINICAL IMPRESSION(S) / ED DIAGNOSES  Final diagnoses:  Chest pain, unspecified type  Lower GI bleed      NEW MEDICATIONS STARTED DURING THIS VISIT:  New Prescriptions   No medications on file     Note:  This document was prepared using Dragon voice recognition software and may include unintentional dictation errors.    Alfred Levins, [place], MD 03/03/17 1022

## 2017-03-03 NOTE — H&P (Signed)
Troy Lopez is an 76 y.o. male.   Chief Complaint: Chest pain HPI: This is 76 year old male has history of coronary artery disease and atrial flutter. He was awoken by left-sided chest pain. Pain did not radiate. He did not become diaphoretic. Currently is pain-free. He is on Eliquis for anticoagulation for the A. fib flutter. Here in the ER is found to have a hemoglobin that was 1 g lower than his last check and on exam he was guaiac positive. He has been worked up for iron deficiency anemia in the past but is currently on no oral iron supplements but gets iron infusions. He is also treated for collagenous colitis but has not had a flareup in several months and currently on no medications.  Past Medical History:  Diagnosis Date  . Adrenal gland anomaly    enlargement  . CAD (coronary artery disease)   . Carpal tunnel syndrome   . Colonic polyp   . COPD (chronic obstructive pulmonary disease) ([place])   . Degenerative disc disease, lumbar   . Depression   . Diverticulosis   . Dyspnea   . GERD (gastroesophageal reflux disease)   . Hypercholesterolemia   . Hyperkalemia   . Hypertension   . Irritable bowel syndrome   . Monoclonal gammopathy   . Neuropathy   . Personal history of tobacco use, presenting hazards to health 08/17/2015  . Sleep apnea     Past Surgical History:  Procedure Laterality Date  . BUNIONECTOMY  1989  . CARPAL TUNNEL RELEASE Left 2011   ulnar nerve sub muscular at elbow  . CHOLECYSTECTOMY  09/07  . ELECTROPHYSIOLOGIC STUDY N/A 11/15/2015   Procedure: CARDIOVERSION;  Surgeon: Isaias Cowman, MD;  Location: [facility] ORS;  Service: Cardiovascular;  Laterality: N/A;  . EYE SURGERY Bilateral 2010   cataract  . HEMORRHOID SURGERY    . KYPHOPLASTY N/A 11/04/2016   Procedure: KYPHOPLASTY T 12;  Surgeon: Hessie Knows, MD;  Location: [facility] ORS;  Service: Orthopedics;  Laterality: N/A;    Family History  Problem Relation Age of Onset  . Stroke Mother   . Heart  disease Father        MI - 25   . Colon cancer Neg Hx   . Prostate cancer Neg Hx    Social History:  reports that he quit smoking about 16 months ago. He has never used smokeless tobacco. He reports that he drinks about 25.2 oz of alcohol per week . He reports that he does not use drugs.  Allergies:  Allergies  Allergen Reactions  . Ivp Dye [Iodinated Diagnostic Agents]     Contraindication secondary to IGMgammopathy/Waldren's syndrome       (Not in a hospital admission)  Results for orders placed or performed during the hospital encounter of 03/03/17 (from the past 48 hour(s))  Basic metabolic panel     Status: Abnormal   Collection Time: 03/03/17  7:17 AM  Result Value Ref Range   Sodium 134 (L) 135 - 145 mmol/L   Potassium 4.3 3.5 - 5.1 mmol/L   Chloride 101 101 - 111 mmol/L   CO2 23 22 - 32 mmol/L   Glucose, Bld 100 (H) 65 - 99 mg/dL   BUN 14 6 - 20 mg/dL   Creatinine, Ser 0.94 0.61 - 1.24 mg/dL   Calcium 9.3 8.9 - 10.3 mg/dL   GFR calc non Af Amer >60 >60 mL/min   GFR calc Af Amer >60 >60 mL/min    Comment: (NOTE) The eGFR  has been calculated using the CKD EPI equation. This calculation has not been validated in all clinical situations. eGFR's persistently <60 mL/min signify possible Chronic Kidney Disease.    Anion gap 10 5 - 15  CBC     Status: Abnormal   Collection Time: 03/03/17  7:17 AM  Result Value Ref Range   WBC 10.4 3.8 - 10.6 K/uL   RBC 3.00 (L) 4.40 - 5.90 MIL/uL   Hemoglobin 9.8 (L) 13.0 - 18.0 g/dL   HCT 28.5 (L) 40.0 - 52.0 %   MCV 95.2 80.0 - 100.0 fL   MCH 32.7 26.0 - 34.0 pg   MCHC 34.3 32.0 - 36.0 g/dL   RDW 15.0 (H) 11.5 - 14.5 %   Platelets 431 150 - 440 K/uL  Troponin I     Status: None   Collection Time: 03/03/17  7:17 AM  Result Value Ref Range   Troponin I <0.03 <0.03 ng/mL  Troponin I     Status: None   Collection Time: 03/03/17 10:17 AM  Result Value Ref Range   Troponin I <0.03 <0.03 ng/mL   Dg Chest 2 View  Result Date:  03/03/2017 CLINICAL DATA:  Right chest pain, nonproductive cough EXAM: CHEST  2 VIEW COMPARISON:  12/13/2016 FINDINGS: Chronic scarring in the right upper lobe and lingula. No focal consolidation. No pleural effusion or pneumothorax. Nipple shadow overlying the left lower lung. The heart is normal in size. Degenerative changes of the visualized thoracolumbar spine. IMPRESSION: No evidence of acute cardiopulmonary disease. Chronic scarring in the lungs bilaterally. Electronically Signed   By: Julian Hy M.D.   On: 03/03/2017 07:43    Review of Systems  Constitutional: Negative for fever.  HENT: Negative for hearing loss.   Eyes: Negative for blurred vision.  Respiratory: Negative for shortness of breath.   Cardiovascular: Positive for chest pain.  Gastrointestinal: Negative for nausea and vomiting.  Genitourinary: Negative for dysuria.  Musculoskeletal: Negative for joint pain.  Skin: Negative for rash.  Neurological: Negative for dizziness.    Blood pressure (!) 154/75, pulse 68, temperature 98.5 F (36.9 C), temperature source Oral, resp. rate 16, height _0  (1.753 m), weight 86.2 kg (190 lb), SpO2 94 %. Physical Exam  Constitutional: He is oriented to person, place, and time. He appears well-developed and well-nourished. No distress.  HENT:  Head: Normocephalic and atraumatic.  Mouth/Throat: Oropharynx is clear and moist. No oropharyngeal exudate.  Eyes: Pupils are equal, round, and reactive to light. EOM are normal. No scleral icterus.  Neck: Neck supple. No JVD present. No tracheal deviation present. No thyromegaly present.  Cardiovascular:  Irregularly irregular. No murmurs  Respiratory: Breath sounds normal. No respiratory distress. He exhibits no tenderness.  GI: Soft. Bowel sounds are normal. He exhibits no distension and no mass.  Musculoskeletal: Normal range of motion. He exhibits no edema or tenderness.  Lymphadenopathy:    He has no cervical adenopathy.   Neurological: He is alert and oriented to person, place, and time. No cranial nerve deficit.  Skin: Skin is warm and dry.     Assessment/Plan 1. Chest pain. Currently patient's pain free. First troponin is negative. He sees Dr. Tomasita Crumble shows as an outpatient. They're recommending nuclear stress test. So we will observe him if he rules out we'll set him up for next stress test in the morning. 2. GI bleeding. He has guaiac-positive and has a small drop in his hemoglobin. I will hold his Eliquis for now. We'll consult GI to  determine if he needs endoscopy. We'll also trend hemoglobins.   3. Iron deficiency anemia. He gets outpatient infusions. Cannot tolerate by mouth iron. 4. Atrial flutter. Rate is controlled. He's undergone cardioversion in the past. Again have not hold his Eliquis until the GI bleeding is evaluated.  5. COPD. Appears to be stable.   Total time spent 45 minutes  Baxter Hire, MD 03/03/2017, 11:01 AM

## 2017-03-03 NOTE — Progress Notes (Signed)
Pt requesting something to help him sleep, pt does not take anything at home currently but states he has taken ambien in the past. Dr. Jannifer Franklin to put in a low dose ambien. No further requests. Will continue to monitor. Conley Simmonds, RN, BSN

## 2017-03-03 NOTE — ED Triage Notes (Signed)
Pt says he was awakened around 430am today with right sided sharp chest pain; shortness of breath; reports nonproductive cough, trying to cough up sputum but says it hurts to take good deep breath; afebrile; talking in complete coherent sentences

## 2017-03-03 NOTE — Consult Note (Signed)
Troy Lame, MD [facility]  [address] [place], [place] [address] Phone: [phone] Fax : [phone]  Consultation  Referring Provider:     Dr. Edwina Barth Primary Care Physician:  Einar Pheasant, MD Primary Gastroenterologist:  Dr. Candace Cruise         Reason for Consultation:     Heme-positive stools  Date of Admission:  03/03/2017 Date of Consultation:  03/03/2017         HPI:   Troy Lopez is a 76 y.o. male who comes in with heme-positive stools and anemia. The patient reports that just prior to being admitted to the hospital he was having chest pains became to the hospital. The patient was found to have a drop in his hemoglobin and heme-positive stools. 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 The patient denies seeing any bright red blood per rectum and has been seen by hematology for anemia. The patient had been started on iron in the past but states that it hurt his stomach and therefore received IV iron with improvement in his hemoglobin as reported by the patient. The patient's hemoglobin 3 months ago was 13.3 that then went down to 12.7 in April 11 0.5 in May with a admission hemoglobin of 10.8 today. The patient has a history of drinking approximately a sixpack of beer a day. He now reports being asymptomatic without any chest pain. The patient also reports that he did not receive any blood products while his been in the hospital.  Past Medical History:  Diagnosis Date  . Adrenal gland anomaly    enlargement  . CAD (coronary artery disease)   . Carpal tunnel syndrome   . Colonic polyp   . COPD (chronic obstructive pulmonary disease) ([place])   . Degenerative disc disease, lumbar   . Depression   . Diverticulosis   . Dyspnea   . GERD (gastroesophageal reflux disease)   . Hypercholesterolemia   . Hyperkalemia   . Hypertension   . Irritable bowel syndrome    . Monoclonal gammopathy   . Neuropathy   . Personal history of tobacco use, presenting hazards to health 08/17/2015  . Sleep apnea     Past Surgical History:  Procedure Laterality Date  . BUNIONECTOMY  1989  . CARPAL TUNNEL RELEASE Left 2011   ulnar nerve sub muscular at elbow  . CHOLECYSTECTOMY  09/07  . ELECTROPHYSIOLOGIC STUDY N/A 11/15/2015   Procedure: CARDIOVERSION;  Surgeon: Isaias Cowman, MD;  Location: [facility] ORS;  Service: Cardiovascular;  Laterality: N/A;  . EYE SURGERY Bilateral 2010   cataract  . HEMORRHOID SURGERY    . KYPHOPLASTY N/A 11/04/2016   Procedure: KYPHOPLASTY T 12;  Surgeon: Hessie Knows, MD;  Location: [facility] ORS;  Service: Orthopedics;  Laterality: N/A;    Prior to Admission medications   Medication Sig Start Date End Date Taking? Authorizing Provider  acetaminophen (TYLENOL) 500 MG tablet Take 500-1,000 mg by mouth 3 (three) times daily as needed for moderate pain or headache.   Yes [provider]  albuterol (PROVENTIL HFA;VENTOLIN HFA) 108 (90 Base) MCG/ACT inhaler Inhale 2 puffs into the lungs every 6 (six) hours as needed for wheezing or shortness of breath.   Yes [provider]  alendronate (FOSAMAX) 70 MG tablet Take 70 mg by mouth once a week. Take with a full glass of water on an empty stomach. Takes on  Sundays   Yes [provider]  amLODipine (NORVASC) 10 MG tablet Take 1 tablet (5 mg total) by mouth daily. 01/22/17  Yes Einar Pheasant, MD  budesonide (ENTOCORT EC) 3 MG 24 hr capsule Take 9 mg by mouth daily as needed (colitis flare).  05/31/15  Yes [provider]  ELIQUIS 5 MG TABS tablet Take 5 mg by mouth 2 (two) times daily.  05/23/16  Yes [provider]  fluticasone (FLONASE) 50 MCG/ACT nasal spray Place 2 sprays into both nostrils daily. 12/06/16  Yes Einar Pheasant, MD  fluticasone furoate-vilanterol (BREO ELLIPTA) 100-25 MCG/INH AEPB Inhale 1 puff into the lungs daily. 05/17/16  Yes Wilhelmina [name], MD  gabapentin (NEURONTIN) 100 MG capsule Take two each morning, two each afternoon and three at bedtime Patient taking differently: 3 (three) times daily as needed. Take two each morning, two each afternoon and three at bedtime as needed for foot pain 09/10/16  Yes Einar Pheasant, MD  isosorbide mononitrate (IMDUR) 30 MG 24 hr tablet Take 30 mg by mouth daily.  07/21/15 03/03/18 Yes [provider]  losartan (COZAAR) 100 MG tablet Take 1 tablet (100 mg total) by mouth daily. 01/22/17  Yes Einar Pheasant, MD  metoprolol tartrate (LOPRESSOR) 100 MG tablet Take 50 mg by mouth 2 (two) times daily.    Yes [provider]  Multiple Vitamin (MULTIVITAMIN WITH MINERALS) TABS tablet Take 1 tablet by mouth daily.   Yes [provider]  pantoprazole (PROTONIX) 40 MG tablet Take 1 tablet (40 mg total) by mouth 2 (two) times daily. 02/24/17  Yes Einar Pheasant, MD  sertraline (ZOLOFT) 50 MG tablet Take 1 tablet (50 mg total) by mouth daily. 12/26/16  Yes Einar Pheasant, MD  vitamin B-12 (CYANOCOBALAMIN) 1000 MCG tablet Take 1,000 mcg by mouth daily.    Yes [provider]  FIBER PO Take 2 capsules by mouth daily.    [provider]  furosemide (LASIX) 20 MG tablet Take 1 tablet (20 mg total) by mouth daily as needed. Patient not taking: Reported on 03/03/2017 06/19/16   Einar Pheasant, MD  mupirocin ointment (BACTROBAN) 2 % Place 1 application into the nose 2 (two) times daily. Patient not taking: Reported on 01/28/2017 01/17/16   Einar Pheasant, MD    Family History  Problem Relation Age of Onset  . Stroke Mother   . Heart disease Father        MI - 54   . Colon cancer Neg Hx   . Prostate cancer Neg Hx      Social History  Substance Use Topics  . Smoking status: Former Smoker    Quit date: 11/01/2015  . Smokeless tobacco: Never Used     Comment: about 2 cigarettes per day, trying to quit  . Alcohol use 25.2 oz/week    42 Cans of beer per week      Comment: occas    Allergies as of 03/03/2017 - Review Complete 03/03/2017  Allergen Reaction Noted  . Ivp dye [iodinated diagnostic agents]  09/08/2012    Review of Systems:    All systems reviewed and negative except where noted in HPI.   Physical Exam:  Vital signs in last 24 hours: Temp:  [97.9 F (36.6 C)-98.8 F (37.1 C)] 97.9 F (36.6 C) (07/16 1228) Pulse Rate:  [66-84] 71 (07/16 1228) Resp:  [11-18] 17 (07/16 1228) BP: (132-161)/(62-86) 145/62 (07/16 1228) SpO2:  [90 %-97 %] 97 % (07/16 1228) Weight:  [190 lb (86.2  kg)] 190 lb (86.2 kg) (07/16 0612)   General:   Pleasant, cooperative in NAD Head:  Normocephalic and atraumatic. Eyes:   No icterus.   Conjunctiva pink. PERRLA. Ears:  Normal auditory acuity. Neck:  Supple; no masses or thyroidomegaly Lungs: Respirations even and unlabored. Lungs clear to auscultation bilaterally.   No wheezes, crackles, or rhonchi.  Heart:  Regular rate and rhythm;  Without murmur, clicks, rubs or gallops Abdomen:  Soft, nondistended, nontender. Normal bowel sounds. No appreciable masses or hepatomegaly.  No rebound or guarding.  Rectal:  Not performed. Msk:  Symmetrical without gross deformities.    Extremities:  Without edema, cyanosis or clubbing. Neurologic:  Alert and oriented x3;  grossly normal neurologically. Skin:  Intact without significant lesions or rashes. Cervical Nodes:  No significant cervical adenopathy. Psych:  Alert and cooperative. Normal affect.  LAB RESULTS:  Recent Labs  03/03/17 0717  WBC 10.4  HGB 9.8*  HCT 28.5*  PLT 431   BMET  Recent Labs  03/03/17 0717  NA 134*  K 4.3  CL 101  CO2 23  GLUCOSE 100*  BUN 14  CREATININE 0.94  CALCIUM 9.3   LFT No results for input(s): PROT, ALBUMIN, AST, ALT, ALKPHOS, BILITOT, BILIDIR, IBILI in the last 72 hours. PT/INR No results for input(s): LABPROT, INR in the last 72 hours.  STUDIES: Dg Chest 2 View  Result Date: 03/03/2017 CLINICAL DATA:  Right  chest pain, nonproductive cough EXAM: CHEST  2 VIEW COMPARISON:  12/13/2016 FINDINGS: Chronic scarring in the right upper lobe and lingula. No focal consolidation. No pleural effusion or pneumothorax. Nipple shadow overlying the left lower lung. The heart is normal in size. Degenerative changes of the visualized thoracolumbar spine. IMPRESSION: No evidence of acute cardiopulmonary disease. Chronic scarring in the lungs bilaterally. Electronically Signed   By: Julian Hy M.D.   On: 03/03/2017 07:43      Impression / Plan:   VERNON MAISH is a 76 y.o. y/o male with Progressive anemia over the last few months with the patient being followed by hematology status post bone marrow biopsy. The patient was found to have heme-positive stools. His last EGD and colonoscopy was in 2014 without any significant finding to explain his anemia. 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 The patient should be evaluated for his chest pain and has been told that after this is evaluated he may then consider undergoing a repeat GI workup for his heme-positive stools. The patient and the patient's wife have been explained the plan and agree with it.   Thank you for involving me in the care of this patient.      LOS: 0 days   Troy Lame, MD  03/03/2017, 4:04 PM   Note: This dictation was prepared with Dragon dictation along with smaller phrase technology. Any transcriptional errors that result from this process are unintentional.

## 2017-03-04 ENCOUNTER — Telehealth: Payer: Self-pay | Admitting: Internal Medicine

## 2017-03-04 ENCOUNTER — Encounter: Payer: Self-pay | Admitting: Radiology

## 2017-03-04 ENCOUNTER — Observation Stay: Payer: PPO

## 2017-03-04 DIAGNOSIS — I2510 Atherosclerotic heart disease of native coronary artery without angina pectoris: Secondary | ICD-10-CM | POA: Diagnosis not present

## 2017-03-04 DIAGNOSIS — K922 Gastrointestinal hemorrhage, unspecified: Secondary | ICD-10-CM | POA: Diagnosis not present

## 2017-03-04 DIAGNOSIS — I4891 Unspecified atrial fibrillation: Secondary | ICD-10-CM | POA: Diagnosis not present

## 2017-03-04 DIAGNOSIS — R079 Chest pain, unspecified: Secondary | ICD-10-CM | POA: Diagnosis not present

## 2017-03-04 LAB — NM MYOCAR MULTI W/SPECT W/WALL MOTION / EF
CHL CUP MPHR: 146 {beats}/min
CHL CUP NUCLEAR SDS: 0
CHL CUP NUCLEAR SRS: 16
CHL CUP RESTING HR STRESS: 90 {beats}/min
CSEPED: 1 min
CSEPEW: 1 METS
Exercise duration (sec): 0 s
LV sys vol: 17 mL
LVDIAVOL: 68 mL (ref 62–150)
NUC STRESS TID: 0.93
Peak HR: 102 {beats}/min
Percent HR: 69 %
SSS: 4

## 2017-03-04 LAB — CBC
HCT: 25.3 % — ABNORMAL LOW (ref 40.0–52.0)
HEMOGLOBIN: 8.8 g/dL — AB (ref 13.0–18.0)
MCH: 33.3 pg (ref 26.0–34.0)
MCHC: 34.7 g/dL (ref 32.0–36.0)
MCV: 95.9 fL (ref 80.0–100.0)
Platelets: 385 10*3/uL (ref 150–440)
RBC: 2.64 MIL/uL — ABNORMAL LOW (ref 4.40–5.90)
RDW: 15.3 % — ABNORMAL HIGH (ref 11.5–14.5)
WBC: 8.5 10*3/uL (ref 3.8–10.6)

## 2017-03-04 LAB — TROPONIN I

## 2017-03-04 MED ORDER — PANTOPRAZOLE SODIUM 40 MG PO TBEC
40.0000 mg | DELAYED_RELEASE_TABLET | Freq: Two times a day (BID) | ORAL | Status: DC
  Administered 2017-03-05: 40 mg via ORAL
  Filled 2017-03-04: qty 1

## 2017-03-04 MED ORDER — TECHNETIUM TC 99M TETROFOSMIN IV KIT
13.0000 | PACK | Freq: Once | INTRAVENOUS | Status: AC | PRN
  Administered 2017-03-04: 13.19 via INTRAVENOUS

## 2017-03-04 MED ORDER — ZOLPIDEM TARTRATE 5 MG PO TABS
5.0000 mg | ORAL_TABLET | Freq: Every evening | ORAL | Status: DC | PRN
  Administered 2017-03-04: 5 mg via ORAL
  Filled 2017-03-04: qty 1

## 2017-03-04 MED ORDER — TECHNETIUM TC 99M TETROFOSMIN IV KIT
28.9400 | PACK | Freq: Once | INTRAVENOUS | Status: AC | PRN
  Administered 2017-03-04: 28.94 via INTRAVENOUS

## 2017-03-04 MED ORDER — REGADENOSON 0.4 MG/5ML IV SOLN
0.4000 mg | Freq: Once | INTRAVENOUS | Status: AC
  Administered 2017-03-04: 0.4 mg via INTRAVENOUS

## 2017-03-04 MED ORDER — POLYETHYLENE GLYCOL 3350 17 GM/SCOOP PO POWD
1.0000 | Freq: Once | ORAL | Status: AC
  Administered 2017-03-04: 255 g via ORAL
  Filled 2017-03-04: qty 255

## 2017-03-04 NOTE — Telephone Encounter (Signed)
You have an opening Friday at 12 per [facility].

## 2017-03-04 NOTE — Telephone Encounter (Signed)
Need more information.  Reviewed H&P.  Discharge note not in the system.  Per note, cardiology saw (he sees cardiology regularly).  They recommended stress test.  Did he have and was it ok.  Regarding his anemia - he sees oncology.  Are they following up with him.  He gets IV iron ,etc from them.  Just need more information, was not sure if he needed f/u with me if following up with these specialist.  If needs f/u with me, then ok to schedule.

## 2017-03-04 NOTE — Telephone Encounter (Signed)
Troy Lopez from [facility] called and needs to schedule pt for a 1 week HFU for chest pain. Please advise, thank you!

## 2017-03-04 NOTE — Care Management Obs Status (Addendum)
[facility] NOTIFICATION   Patient Details  Name: MONTREL DONAHOE MRN: [id number] Date of Birth: 1940/11/25   Medicare Observation Status Notification Given: YES Ended up providing observation notice after all as patient still in facility > 24 hours   No < 24 hours   Katrina Stack, RN 03/04/2017, 10:23 AM

## 2017-03-04 NOTE — Discharge Instructions (Signed)
Heart healthy diet. Follow-up oncologist as outpatient. °

## 2017-03-04 NOTE — Progress Notes (Signed)
[place] [facility] [place] NAME: Troy Lopez    MR#:  [id number]  DATE OF BIRTH:  Jun 11, 1941  SUBJECTIVE:  CHIEF COMPLAINT:   Chief Complaint  Patient presents with  . Chest Pain   No chest pain.fatigue. No melena or bloody stool. REVIEW OF SYSTEMS:  Review of Systems  Constitutional: Positive for malaise/fatigue. Negative for chills and fever.  HENT: Negative for sore throat.   Eyes: Negative for blurred vision and double vision.  Respiratory: Negative for cough, shortness of breath and stridor.   Cardiovascular: Negative for chest pain and leg swelling.  Gastrointestinal: Negative for abdominal pain, blood in stool, diarrhea, melena, nausea and vomiting.  Genitourinary: Negative for dysuria and hematuria.  Musculoskeletal: Negative for back pain.  Skin: Negative for itching and rash.  Neurological: Positive for weakness. Negative for dizziness, focal weakness and loss of consciousness.  Psychiatric/Behavioral: Negative for depression. The patient is not nervous/anxious.     DRUG ALLERGIES:   Allergies  Allergen Reactions  . 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 rate 20, height 5\' 9"  (1.753 m), weight 190 lb (86.2 kg), SpO2 98 %. PHYSICAL EXAMINATION:  Physical Exam  Constitutional: He is oriented to person, place, and time and well-developed, well-nourished, and in no distress.  HENT:  Head: Normocephalic.  Mouth/Throat: Oropharynx is clear and moist.  Eyes: Conjunctivae and EOM are normal. No scleral icterus.  Neck: Normal range of motion. Neck supple. No JVD present. No tracheal deviation present.  Cardiovascular: Normal rate, regular rhythm and normal heart sounds.   Pulmonary/Chest: Effort normal and breath sounds normal. No respiratory distress. He  has no wheezes. He has no rales.  Abdominal: Soft. Bowel sounds are normal. He exhibits no distension. There is no tenderness.  Musculoskeletal: Normal range of motion. He exhibits no edema or tenderness.  Neurological: He is alert and oriented to person, place, and time. No cranial nerve deficit.  Skin: No rash noted. No erythema.  Psychiatric: Affect and judgment normal.   LABORATORY PANEL:  Male CBC  Recent Labs Lab 03/04/17 0016  WBC 8.5  HGB 8.8*  HCT 25.3*  PLT 385   ------------------------------------------------------------------------------------------------------------------ Chemistries   Recent Labs Lab 03/03/17 0717  NA 134*  K 4.3  CL 101  CO2 23  GLUCOSE 100*  BUN 14  CREATININE 0.94  CALCIUM 9.3   RADIOLOGY:  Nm Myocar Multi W/spect W/wall Motion / Ef  Result Date: 03/04/2017  The study is normal.  This is a low risk study.  The left ventricular ejection fraction is hyperdynamic (>65%).  Nuclear stress EF: 67%.  Normal Myoview portion stress Await images   ASSESSMENT AND PLAN:   1. Chest pain. Currently patient's pain free. First troponin is negative. Normal nuclear stress test.   2. GI bleeding. He has guaiac-positive and has a small drop in his hemoglobin. hold his Eliquis for now. EGD tomorrow per Dr. Allen Norris.  Anemia due to blood loss, Hold Eliquis and follow-up hemoglobins.    3. Iron deficiency anemia. He gets outpatient infusions. Cannot tolerate by mouth iron. 4. Atrial flutter. Rate is controlled. He's undergone cardioversion in the past. hold his Eliquis until the GI bleeding is evaluated.  5. COPD. stable  All the records are reviewed and case discussed with Care Management/Social Worker. Management plans discussed with  the patient, his wife and they are in agreement.  CODE STATUS: Full Code  TOTAL TIME TAKING CARE OF THIS PATIENT:  37 minutes.   More than 50% of the time was spent in counseling/coordination of care:  YES  POSSIBLE D/C IN 1 DAYS, DEPENDING ON CLINICAL CONDITION.   Demetrios Loll M.D on 03/04/2017 at 3:41 PM  Between 7am to 6pm - Pager - [phone]  After 6pm go to [email or web address] - Proofreader  [facility]  Office  [phone]  CC: Primary care physician; Einar Pheasant, MD  Note: This dictation was prepared with Dragon dictation along with smaller phrase technology. Any transcriptional errors that result from this process are unintentional.

## 2017-03-04 NOTE — Telephone Encounter (Signed)
Patient was in for Observation not eligible for TCM, patient chest pain, Abnormal CBC Hgb 8.8 today with HCT 25.3 asking for FU with in one week. Hospital called plan to DC today.

## 2017-03-04 NOTE — Plan of Care (Signed)
Problem: Pain Managment: Goal: General experience of comfort will improve Outcome: Progressing Complained of a headache once, treated with extra strength tylenol, which gave relief. Will continue to monitor.  Problem: Activity: Goal: Risk for activity intolerance will decrease Outcome: Completed/Met Date Met: 03/04/17 Up independently in room. Walked around nurses station twice this shift, tolerated well.

## 2017-03-05 ENCOUNTER — Observation Stay: Payer: PPO | Admitting: Anesthesiology

## 2017-03-05 ENCOUNTER — Encounter: Payer: Self-pay | Admitting: Anesthesiology

## 2017-03-05 ENCOUNTER — Encounter
Admission: EM | Disposition: A | Discharge status: Home / Self Care | Payer: Self-pay | Source: Home / Self Care | Attending: Emergency Medicine

## 2017-03-05 DIAGNOSIS — I4891 Unspecified atrial fibrillation: Secondary | ICD-10-CM | POA: Diagnosis not present

## 2017-03-05 DIAGNOSIS — K449 Diaphragmatic hernia without obstruction or gangrene: Secondary | ICD-10-CM | POA: Diagnosis not present

## 2017-03-05 DIAGNOSIS — R079 Chest pain, unspecified: Secondary | ICD-10-CM | POA: Diagnosis not present

## 2017-03-05 DIAGNOSIS — R195 Other fecal abnormalities: Secondary | ICD-10-CM | POA: Diagnosis not present

## 2017-03-05 DIAGNOSIS — K922 Gastrointestinal hemorrhage, unspecified: Secondary | ICD-10-CM | POA: Diagnosis not present

## 2017-03-05 DIAGNOSIS — Z1211 Encounter for screening for malignant neoplasm of colon: Secondary | ICD-10-CM | POA: Diagnosis not present

## 2017-03-05 DIAGNOSIS — K5790 Diverticulosis of intestine, part unspecified, without perforation or abscess without bleeding: Secondary | ICD-10-CM | POA: Diagnosis not present

## 2017-03-05 DIAGNOSIS — F329 Major depressive disorder, single episode, unspecified: Secondary | ICD-10-CM | POA: Diagnosis not present

## 2017-03-05 DIAGNOSIS — I2510 Atherosclerotic heart disease of native coronary artery without angina pectoris: Secondary | ICD-10-CM | POA: Diagnosis not present

## 2017-03-05 DIAGNOSIS — K921 Melena: Secondary | ICD-10-CM | POA: Diagnosis not present

## 2017-03-05 DIAGNOSIS — K5730 Diverticulosis of large intestine without perforation or abscess without bleeding: Secondary | ICD-10-CM | POA: Diagnosis not present

## 2017-03-05 DIAGNOSIS — K621 Rectal polyp: Secondary | ICD-10-CM | POA: Diagnosis not present

## 2017-03-05 HISTORY — PX: COLONOSCOPY WITH PROPOFOL: SHX5780

## 2017-03-05 HISTORY — PX: ESOPHAGOGASTRODUODENOSCOPY (EGD) WITH PROPOFOL: SHX5813

## 2017-03-05 LAB — HEMOGLOBIN: HEMOGLOBIN: 9.2 g/dL — AB (ref 13.0–18.0)

## 2017-03-05 SURGERY — ESOPHAGOGASTRODUODENOSCOPY (EGD) WITH PROPOFOL
Anesthesia: General

## 2017-03-05 MED ORDER — SODIUM CHLORIDE 0.9 % IV SOLN: INTRAVENOUS | Status: DC

## 2017-03-05 MED ORDER — IPRATROPIUM-ALBUTEROL 0.5-2.5 (3) MG/3ML IN SOLN
3.0000 mL | Freq: Once | RESPIRATORY_TRACT | Status: AC
  Administered 2017-03-05: 3 mL via RESPIRATORY_TRACT

## 2017-03-05 MED ORDER — IPRATROPIUM-ALBUTEROL 0.5-2.5 (3) MG/3ML IN SOLN
RESPIRATORY_TRACT | Status: AC
  Administered 2017-03-05: 3 mL via RESPIRATORY_TRACT
  Filled 2017-03-05: qty 3

## 2017-03-05 MED ORDER — GLYCOPYRROLATE 0.2 MG/ML IJ SOLN
INTRAMUSCULAR | Status: DC | PRN
  Administered 2017-03-05: 0.1 mg via INTRAVENOUS

## 2017-03-05 MED ORDER — LIDOCAINE HCL (CARDIAC) 20 MG/ML IV SOLN
INTRAVENOUS | Status: DC | PRN
  Administered 2017-03-05: 60 mg via INTRAVENOUS

## 2017-03-05 MED ORDER — PROPOFOL 500 MG/50ML IV EMUL
INTRAVENOUS | Status: DC | PRN
  Administered 2017-03-05: 140 ug/kg/min via INTRAVENOUS

## 2017-03-05 MED ORDER — SODIUM CHLORIDE 0.9 % IV SOLN
INTRAVENOUS | Status: DC
  Administered 2017-03-05: 1000 mL via INTRAVENOUS

## 2017-03-05 MED ORDER — PROPOFOL 10 MG/ML IV BOLUS
INTRAVENOUS | Status: DC | PRN
  Administered 2017-03-05: 60 mg via INTRAVENOUS

## 2017-03-05 NOTE — Op Note (Signed)
[facility] Patient Name: Troy Lopez Procedure Date: 03/05/2017 11:33 AM MRN: [id number] Account #: [id number] Date of Birth: 24-May-1941 Admit Type: Inpatient Age: 76 Room: [facility] ENDO ROOM 4 Gender: Male Note Status: Finalized Procedure:            Colonoscopy Indications:          Gastrointestinal occult blood loss Providers:            Lucilla Lame MD, MD Medicines:            Propofol per Anesthesia Complications:        No immediate complications. Procedure:            Pre-Anesthesia Assessment:                       - Prior to the procedure, a History and Physical was                        performed, and patient medications and allergies were                        reviewed. The patient's tolerance of previous                        anesthesia was also reviewed. The risks and benefits of                        the procedure and the sedation options and risks were                        discussed with the patient. All questions were                        answered, and informed consent was obtained. Prior                        Anticoagulants: The patient has taken no previous                        anticoagulant or antiplatelet agents. ASA Grade                        Assessment: III - A patient with severe systemic                        disease. After reviewing the risks and benefits, the                        patient was deemed in satisfactory condition to undergo                        the procedure.                       After obtaining informed consent, the colonoscope was                        passed under direct vision. Throughout the procedure,                        the patient's blood pressure, pulse,  and oxygen                        saturations were monitored continuously. The Olympus                        CF-H180AL colonoscope ( S#: [id number] ) was introduced                        through the anus and advanced to the the  cecum,                        identified by appendiceal orifice and ileocecal valve.                        The colonoscopy was performed with ease. The patient                        tolerated the procedure well. The quality of the bowel                        preparation was fair. Findings:      The perianal and digital rectal examinations were normal.      A 3 mm polyp was found in the rectum. The polyp was sessile. The polyp       was removed with a cold snare. Resection and retrieval were complete.      Multiple small-mouthed diverticula were found in the sigmoid colon.      Internal hemorrhoids were found during retroflexion. The hemorrhoids       were Grade II (internal hemorrhoids that prolapse but reduce       spontaneously). Impression:           - Preparation of the colon was fair.                       - One 3 mm polyp in the rectum, removed with a cold                        snare. Resected and retrieved.                       - Diverticulosis in the sigmoid colon.                       - Internal hemorrhoids. Recommendation:       - Return patient to hospital ward for ongoing care.                       - Advance diet as tolerated.                       - Continue present medications. Procedure Code(s):    --- Professional ---                       [id number], Colonoscopy, flexible; with removal of tumor(s),                        polyp(s), or other lesion(s) by snare technique Diagnosis Code(s):    --- Professional ---  R19.5, Other fecal abnormalities                       K57.30, Diverticulosis of large intestine without                        perforation or abscess without bleeding                       K62.1, Rectal polyp CPT copyright 2016 American Medical Association. All rights reserved. The codes documented in this report are preliminary and upon coder review may  be revised to meet current compliance requirements. Lucilla Lame MD, MD 03/05/2017  12:10:13 PM This report has been signed electronically. Number of Addenda: 0 Note Initiated On: 03/05/2017 11:33 AM Scope Withdrawal Time: 0 hours 8 minutes 2 seconds  Total Procedure Duration: 0 hours 14 minutes 1 second       [facility]

## 2017-03-05 NOTE — Telephone Encounter (Signed)
Patient still hospitalized per chart having EGD today. FYI

## 2017-03-05 NOTE — Anesthesia Preprocedure Evaluation (Signed)
Anesthesia Evaluation  Patient identified by MRN, date of birth, ID band Patient awake    Reviewed: Allergy & Precautions, H&P , NPO status , Patient's Chart, lab work & pertinent test results  History of Anesthesia Complications Negative for: history of anesthetic complications  Airway Mallampati: III  TM Distance: <3 FB Neck ROM: limited    Dental  (+) Poor Dentition, Missing, Upper Dentures, Lower Dentures   Pulmonary shortness of breath, sleep apnea , COPD, former smoker,           Cardiovascular Exercise Tolerance: Good hypertension, + CAD       Neuro/Psych PSYCHIATRIC DISORDERS Depression  Neuromuscular disease    GI/Hepatic Neg liver ROS, GERD  Controlled and Medicated,  Endo/Other  negative endocrine ROS  Renal/GU negative Renal ROS  negative genitourinary   Musculoskeletal  (+) Arthritis ,   Abdominal   Peds  Hematology negative hematology ROS (+)   Anesthesia Other Findings Past Medical History: 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 No date: CAD (coronary artery disease) No date: Carpal tunnel syndrome No date: Colonic polyp No date: COPD (chronic obstructive pulmonary disease) ([facility]) No date: Degenerative disc disease, lumbar No date: Depression No date: Diverticulosis No date: Dyspnea No date: GERD (gastroesophageal reflux disease) No date: Hypercholesterolemia No date: Hyperkalemia No date: Hypertension No date: Irritable bowel syndrome 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 No date: Neuropathy 08/17/2015: Personal history of tobacco use, presenting hazards to  health No date: Sleep apnea  Past Surgical History: 1989: BUNIONECTOMY 2011: CARPAL TUNNEL RELEASE; Left     Comment:  ulnar nerve sub muscular at elbow 09/07: CHOLECYSTECTOMY 11/15/2015: ELECTROPHYSIOLOGIC STUDY; N/A     Comment:  Procedure: CARDIOVERSION;  Surgeon: Isaias Cowman,              MD;  Location:  [facility] ORS;  Service: Cardiovascular;                Laterality: N/A; 2010: EYE SURGERY; Bilateral     Comment:  cataract No date: HEMORRHOID SURGERY 11/04/2016: KYPHOPLASTY; N/A     Comment:  Procedure: KYPHOPLASTY T 12;  Surgeon: Hessie Knows, MD;              Location: [facility] ORS;  Service: Orthopedics;  Laterality:               N/A;  BMI    Body Mass Index:  28.06 kg/m      Reproductive/Obstetrics negative OB ROS                             Anesthesia Physical Anesthesia Plan  ASA: III  Anesthesia Plan: General   Post-op Pain Management:    Induction: Intravenous  PONV Risk Score and Plan:   Airway Management Planned: Natural Airway and Nasal Cannula  Additional Equipment:   Intra-op Plan:   Post-operative Plan:   Informed Consent: I have reviewed the patients History and Physical, chart, labs and discussed the procedure including the risks, benefits and alternatives for the proposed anesthesia with the patient or authorized representative who has indicated his/her understanding and acceptance.   Dental Advisory Given  Plan Discussed with: Anesthesiologist, CRNA and Surgeon  Anesthesia Plan Comments: (Patient consented for risks of anesthesia including but not limited to:  - adverse reactions to medications - risk of intubation if required - damage to teeth, lips or other oral mucosa - sore throat or hoarseness - Damage to  heart, brain, lungs or loss of life  Patient voiced understanding.)        Anesthesia Quick Evaluation

## 2017-03-05 NOTE — Transfer of Care (Signed)
Immediate Anesthesia Transfer of Care Note  Patient: Troy Lopez  Procedure(s) Performed: Procedure(s): ESOPHAGOGASTRODUODENOSCOPY (EGD) WITH PROPOFOL (N/A) COLONOSCOPY WITH PROPOFOL (N/A)  Patient Location: PACU and Endoscopy Unit  Anesthesia Type:General  Level of Consciousness: awake and patient cooperative  Airway & Oxygen Therapy: Patient Spontanous Breathing and Patient connected to nasal cannula oxygen  Post-op Assessment: Report given to RN and Post -op Vital signs reviewed and stable  Post vital signs: Reviewed and stable  Last Vitals:  Vitals:   03/05/17 0743 03/05/17 1054  BP: (!) 149/80 135/88  Pulse: 66 (!) 59  Resp:  18  Temp:  (!) 36.2 C    Last Pain:  Vitals:   03/05/17 1054  TempSrc: Oral  PainSc:       Patients Stated Pain Goal: 2 (51/10/21 1173)  Complications: No apparent anesthesia complications

## 2017-03-05 NOTE — Anesthesia Postprocedure Evaluation (Signed)
Anesthesia Post Note  Patient: Adolphe Fortunato Nixon  Procedure(s) Performed: Procedure(s) (LRB): ESOPHAGOGASTRODUODENOSCOPY (EGD) WITH PROPOFOL (N/A) COLONOSCOPY WITH PROPOFOL (N/A)  Patient location during evaluation: PACU Anesthesia Type: General Level of consciousness: awake Pain management: pain level controlled Vital Signs Assessment: post-procedure vital signs reviewed and stable Respiratory status: spontaneous breathing Cardiovascular status: stable Anesthetic complications: no     Last Vitals:  Vitals:   03/05/17 1236 03/05/17 1246  BP: 112/65 130/75  Pulse: 72 65  Resp: 16 13  Temp:      Last Pain:  Vitals:   03/05/17 1216  TempSrc: Tympanic  PainSc:                  VAN STAVEREN,Deya Bigos

## 2017-03-05 NOTE — Op Note (Signed)
[facility] Patient Name: Troy Lopez Procedure Date: 03/05/2017 11:34 AM MRN: [id number] Account #: [id number] Date of Birth: [birth year] Admit Type: Inpatient Age: 76 Room: [facility] ENDO ROOM 4 Gender: Male Note Status: Finalized Procedure:            Upper GI endoscopy Indications:          Acute post hemorrhagic anemia Providers:            Lucilla Lame MD, MD Referring MD:         Einar Pheasant, MD (Referring MD) Medicines:            Propofol per Anesthesia Complications:        No immediate complications. Procedure:            Pre-Anesthesia Assessment:                       - Prior to the procedure, a History and Physical was                        performed, and patient medications and allergies were                        reviewed. The patient's tolerance of previous                        anesthesia was also reviewed. The risks and benefits of                        the procedure and the sedation options and risks were                        discussed with the patient. All questions were                        answered, and informed consent was obtained. Prior                        Anticoagulants: The patient has taken no previous                        anticoagulant or antiplatelet agents. ASA Grade                        Assessment: II - A patient with mild systemic disease.                        After reviewing the risks and benefits, the patient was                        deemed in satisfactory condition to undergo the                        procedure.                       After obtaining informed consent, the endoscope was                        passed under direct vision. Throughout the procedure,  the patient's blood pressure, pulse, and oxygen                        saturations were monitored continuously. The Endoscope                        was introduced through the mouth, and advanced to the               second part of duodenum. The upper GI endoscopy was                        accomplished without difficulty. The patient tolerated                        the procedure well. Findings:      A small hiatal hernia was present.      A mild Schatzki ring (acquired) was found in the lower third of the       esophagus.      Localized mild inflammation was found in the gastric antrum.      The examined duodenum was normal. Impression:           - Small hiatal hernia.                       - Mild Schatzki ring.                       - Gastritis.                       - Normal examined duodenum.                       - No specimens collected. Recommendation:       - Return patient to hospital ward for ongoing care.                       - Advance diet as tolerated. Procedure Code(s):    --- Professional ---                       [phone], Esophagogastroduodenoscopy, flexible, transoral;                        diagnostic, including collection of specimen(s) by                        brushing or washing, when performed (separate procedure) Diagnosis Code(s):    --- Professional ---                       D62, Acute posthemorrhagic anemia                       K29.70, Gastritis, unspecified, without bleeding CPT copyright 2016 American Medical Association. All rights reserved. The codes documented in this report are preliminary and upon coder review may  be revised to meet current compliance requirements. Lucilla Lame MD, MD 03/05/2017 11:50:54 AM This report has been signed electronically. Number of Addenda: 0 Note Initiated On: 03/05/2017 11:34 AM      [facility]

## 2017-03-05 NOTE — Anesthesia Post-op Follow-up Note (Cosign Needed)
Anesthesia QCDR form completed.        

## 2017-03-05 NOTE — Discharge Summary (Signed)
Troy Lopez, is a 76 y.o. male  DOB 07-Feb-1941  MRN [id number].  Admission date:  03/03/2017  Admitting Physician  Baxter Hire, MD  Discharge Date:  03/05/2017   Primary MD  Einar Pheasant, MD  Recommendations for primary care physician for things to follow:   Follow-up with PCP in one week fFollow up with Dr.  Marius Ditch in 2 weeks  Admission Diagnosis  Lower GI bleed [K92.2] Chest pain, unspecified type [R07.9]   Discharge Diagnosis  Lower GI bleed [K92.2] Chest pain, unspecified type [R07.9]    Active Problems:   Chest pain   Blood in stool   Abnormal feces   Rectal polyp      Past Medical History:  Diagnosis Date  . Adrenal gland anomaly    enlargement  . CAD (coronary artery disease)   . Carpal tunnel syndrome   . Colonic polyp   . COPD (chronic obstructive pulmonary disease) ([place])   . Degenerative disc disease, lumbar   . Depression   . Diverticulosis   . Dyspnea   . GERD (gastroesophageal reflux disease)   . Hypercholesterolemia   . Hyperkalemia   . Hypertension   . Irritable bowel syndrome   . Monoclonal gammopathy   . Neuropathy   . Personal history of tobacco use, presenting hazards to health 08/17/2015  . Sleep apnea     Past Surgical History:  Procedure Laterality Date  . BUNIONECTOMY  1989  . CARPAL TUNNEL RELEASE Left 2011   ulnar nerve sub muscular at elbow  . CHOLECYSTECTOMY  09/07  . ELECTROPHYSIOLOGIC STUDY N/A 11/15/2015   Procedure: CARDIOVERSION;  Surgeon: Isaias Cowman, MD;  Location: [facility] ORS;  Service: Cardiovascular;  Laterality: N/A;  . EYE SURGERY Bilateral 2010   cataract  . HEMORRHOID SURGERY    . KYPHOPLASTY N/A 11/04/2016   Procedure: KYPHOPLASTY T 12;  Surgeon: Hessie Knows, MD;  Location: [facility] ORS;  Service: Orthopedics;  Laterality: N/A;        History of present illness and  Hospital Course:     Kindly see H&P for history of present illness and admission details, please review complete Labs, Consult reports and Test reports for all details in brief  HPI  from the history and physical done on the day of admission  76 year old male patient admitted for colitis, fatigue, generalized weakness, chest pain. Admitted to telemetry. Patient also found to have 1 g drop in his baseline hemoglobin and he also had guaiac-positive stool. Concerning this also he is admitted. Patient takes anticoagulation for atrial fibrillation.    Hospital Course   1 noncardiac chest pain, chest pain due to anxiety.: Patient's stress test was negative, troponins negative. Ejection fraction 67%.   #2 . GI bleeding with guaiac-positive stool, patient never had the cross-sectional GI bleed, and never required blood transfusion. Seen by gastroenterology, patient had EGD, colonoscopy today. EGD essentially normal, colonoscopy , 3 mm polyp in the rectum.. Is removed.spoke with gastroenterology, they recommended to resume Eliquis   after 3 days for him. Discussed the same with him. #3. Atrial fibrillation: Controlled, continue metoprolol 50 MG twice a day, resume  Eliquis after 3 days  Discharge Condition: stable   Follow UP  Follow-up Information    Einar Pheasant, MD In 1 week.   Specialty:  Internal Medicine Why:  The office will call you with a appointment! Thanks  Contact information: [address] [place] [address] [phone]        Allen Norris,  Darren, MD. Daphane Shepherd on 04/09/2017.   Specialty:  Gastroenterology Why:  Appointment Time: 1:30pm with Dr. Marius Ditch. Contact information: [place] [address] [phone]        Lloyd Huger, MD In 1 week.   Specialty:  Oncology Why:  The office will call you with an appointment.  Contact information: [place]  [address] [phone]        Isaias Cowman, MD. Go on 03/14/2017.   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   albuterol 108 (90 Base) MCG/ACT inhaler Commonly known as:  PROVENTIL HFA;VENTOLIN HFA Inhale 2 puffs into the lungs every 6 (six) hours as needed for wheezing or shortness of breath.   alendronate 70 MG tablet Commonly known as:  FOSAMAX Take 70 mg by mouth once a week. Take with a full glass of water on an empty stomach. Takes on Sundays   amLODipine 10 MG tablet Commonly known as:  NORVASC Take 1 tablet (5 mg total) by mouth daily.   budesonide 3 MG 24 hr capsule Commonly known as:  ENTOCORT EC Take 9 mg by mouth daily as needed (colitis flare).   ELIQUIS 5 MG Tabs tablet Generic drug:  apixaban Take 5 mg by mouth 2 (two) times daily.   FIBER PO Take 2 capsules by mouth daily.   fluticasone 50 MCG/ACT nasal spray Commonly known as:  FLONASE Place 2 sprays into both nostrils daily.   fluticasone furoate-vilanterol 100-25 MCG/INH Aepb Commonly known as:  BREO ELLIPTA Inhale 1 puff into the lungs daily.   furosemide 20 MG tablet Commonly known as:  LASIX Take 1 tablet (20 mg total) by mouth daily as needed.   gabapentin 100 MG capsule Commonly known as:   NEURONTIN Take two each morning, two each afternoon and three at bedtime What changed:  when to take this  reasons to take this  additional instructions   isosorbide mononitrate 30 MG 24 hr tablet Commonly known as:  IMDUR Take 30 mg by mouth daily.   losartan 100 MG tablet Commonly known as:  COZAAR Take 1 tablet (100 mg total) by mouth daily.   metoprolol tartrate 100 MG tablet Commonly known as:  LOPRESSOR Take 50 mg by mouth 2 (two) times daily.   multivitamin with minerals Tabs tablet Take 1 tablet by mouth daily.   mupirocin ointment 2 % Commonly known as:  BACTROBAN Place 1 application into the nose 2 (two) times daily.   pantoprazole 40 MG tablet Commonly known as:  PROTONIX Take 1 tablet (40 mg total) by mouth 2 (two) times daily.   sertraline 50 MG tablet Commonly known as:  ZOLOFT Take 1  tablet (50 mg total) by mouth daily.   vitamin B-12 1000 MCG tablet Commonly known as:  CYANOCOBALAMIN Take 1,000 mcg by mouth daily.         Diet and Activity recommendation: See Discharge Instructions above   Consults obtained - GI   Major procedures and Radiology Reports - PLEASE review detailed and final reports for all details, in brief -     Dg Chest 2 View  Result Date: 03/03/2017 CLINICAL DATA:  Right chest pain, nonproductive cough EXAM: CHEST  2 VIEW COMPARISON:  12/13/2016 FINDINGS: Chronic scarring in the right upper lobe and lingula. No focal consolidation. No pleural effusion or pneumothorax. Nipple shadow overlying the left lower lung. The heart is normal in size. Degenerative changes of the visualized thoracolumbar spine. IMPRESSION: No evidence of acute cardiopulmonary disease. Chronic scarring in the lungs bilaterally. Electronically Signed   By: Julian Hy M.D.   On: 03/03/2017 07:43   Nm Myocar Multi W/spect W/wall Motion / Ef  Result Date: 03/04/2017  The study is normal.  This is a low risk study.  The left ventricular ejection  fraction is hyperdynamic (>65%).  Nuclear stress EF: 67%.  Normal Myoview portion stress Await images    Micro Results     No results found for this or any previous visit (from the past 240 hour(s)).     Today   Subjective:   Troy Lopez today has no headache,no chest abdominal pain,no new weakness tingling or numbness, feels much better wants to go home today.   Objective:   Blood pressure 134/63, pulse 60, temperature (!) 97.5 F (36.4 C), temperature source Oral, resp. rate 16, height 5\' 9"  (1.753 m), weight 86.2 kg (190 lb), SpO2 97 %.   Intake/Output Summary (Last 24 hours) at 03/05/17 1352 Last data filed at 03/05/17 1204  Gross per 24 hour  Intake             1163 ml  Output             1000 ml  Net              163 ml    Exam Awake Alert, Oriented x 3, No new F.N deficits, Normal affect [place].AT,PERRAL Supple Neck,No JVD, No cervical lymphadenopathy appriciated.  Symmetrical Chest wall movement, Good air movement bilaterally, CTAB RRR,No Gallops,Rubs or new Murmurs, No Parasternal Heave +ve B.Sounds, Abd Soft, Non tender, No organomegaly appriciated, No rebound -guarding or rigidity. No Cyanosis, Clubbing or edema, No new Rash or bruise  Data Review   CBC w Diff: Lab Results  Component Value Date   WBC 8.5 03/04/2017   HGB 9.2 (L) 03/05/2017   HGB 14.5 06/07/2014   HCT 25.3 (L) 03/04/2017   HCT 41.1 06/07/2014   PLT 385 03/04/2017   PLT 410 06/07/2014   LYMPHOPCT 15 01/08/2017   LYMPHOPCT 28.4 06/07/2014   MONOPCT 10 01/08/2017   MONOPCT 11.4 06/07/2014   EOSPCT 0 01/08/2017   EOSPCT 0.7 06/07/2014   BASOPCT 1 01/08/2017   BASOPCT 1.1 06/07/2014    CMP: Lab Results  Component Value Date   NA 134 (L) 03/03/2017   NA 130 (L) 01/23/2013   K 4.3 03/03/2017   K 4.4 01/23/2013   CL 101 03/03/2017   CL 100 01/23/2013   CO2 23 03/03/2017   CO2 23 01/23/2013   BUN 14 03/03/2017   BUN 13 01/23/2013   CREATININE 0.94 03/03/2017   CREATININE 1.15  04/22/2014   GLU  93 01/30/2012   PROT 8.3 (H) 12/27/2016   PROT 8.9 (H) 04/12/2014   ALBUMIN 3.4 (L) 12/27/2016   ALBUMIN 3.3 (L) 04/12/2014   BILITOT 0.3 12/27/2016   BILITOT 0.3 04/12/2014   ALKPHOS 69 12/27/2016   ALKPHOS 67 04/12/2014   AST 19 12/27/2016   AST 14 (L) 04/12/2014   ALT 14 (L) 12/27/2016   ALT 21 04/12/2014  .   Total Time in preparing paper work, data evaluation and todays exam - 71 minutes  Troy Lopez M.D on 03/05/2017 at 1:52 PM    Note: This dictation was prepared with Dragon dictation along with smaller phrase technology. Any transcriptional errors that result from this process are unintentional.

## 2017-03-05 NOTE — Telephone Encounter (Signed)
Troy Lopez from [facility] called and stated that pt is being discharged today. Please advise, thank you!

## 2017-03-05 NOTE — Progress Notes (Signed)
Pt discharged home after EGD/colonoscopy was completed today,  He had no blood in his stool during the prep.  No abdominal pain.  Small hiatal hernia found along with 1 polyp, small diverticulosis and hemorroids.  Will inform pt not to restart elequis for 3 days. No diet restrictions. Will review f/u appts and d/c instructions.  IV removed .  Pt will go home with his wife.

## 2017-03-05 NOTE — Plan of Care (Signed)
Problem: Pain Managment: Goal: General experience of comfort will improve Outcome: Progressing Complaints of a headache last night treated with extra strength tylenol with relief.  Problem: Bowel/Gastric: Goal: Will not experience complications related to bowel motility Outcome: Completed/Met Date Met: 03/05/17 Several BM's, due to bowel prep before EGD

## 2017-03-06 ENCOUNTER — Encounter: Payer: Self-pay | Admitting: Gastroenterology

## 2017-03-07 NOTE — Telephone Encounter (Signed)
See if he can come in on 03/10/17 at 11:00

## 2017-03-07 NOTE — Telephone Encounter (Signed)
Patient really wants to follow up with PCP but has an appointment that day with Dr. Idelle Leech at  12:45

## 2017-03-07 NOTE — Telephone Encounter (Signed)
Does he need a f/u appt with me.  If so, then I can see him 03/14/17 at 12:00.  Thanks

## 2017-03-07 NOTE — Telephone Encounter (Signed)
Pt called and sched

## 2017-03-07 NOTE — Telephone Encounter (Signed)
Transition Care Management Follow-up Telephone Call  How have you been since you were released from the hospital? Patient stated that he is more short of breath since being home, especially going up stairs relieves with rest and albuterol inhaler.. No blood in stools since being home that could be visualized. Denies any pain , or other symptoms . Patient would like to follow up with PCP.   Do you understand why you were in the hospital?Yes   Do you understand the discharge instrcutions?yes  Items Reviewed:  Medications reviewed: Yes , no eliquis for 3 days.  Allergies reviewed: Yes  Dietary changes reviewed: Yes  Referrals reviewed: Yes, cardiology July 27 , and Dr. Grayland Ormond. In a couple of weeks per patient.   Functional Questionnaire:   Activities of Daily Living (ADLs):   He states they are independent in the following: All ADLs. States they require assistance with the following: No assist required.   Any transportation issues/concerns?: no   Any patient concerns? Not at this time.   Confirmed importance and date/time of follow-up visits scheduled: yes   Confirmed with patient if condition begins to worsen call PCP or go to the ER.  Patient was given the Call-a-Nurse line [phone]: yes

## 2017-03-10 ENCOUNTER — Ambulatory Visit (INDEPENDENT_AMBULATORY_CARE_PROVIDER_SITE_OTHER): Payer: PPO | Admitting: Internal Medicine

## 2017-03-10 ENCOUNTER — Encounter: Payer: Self-pay | Admitting: Internal Medicine

## 2017-03-10 DIAGNOSIS — I10 Essential (primary) hypertension: Secondary | ICD-10-CM | POA: Diagnosis not present

## 2017-03-10 DIAGNOSIS — E871 Hypo-osmolality and hyponatremia: Secondary | ICD-10-CM

## 2017-03-10 DIAGNOSIS — G8929 Other chronic pain: Secondary | ICD-10-CM

## 2017-03-10 DIAGNOSIS — K52831 Collagenous colitis: Secondary | ICD-10-CM | POA: Diagnosis not present

## 2017-03-10 DIAGNOSIS — J449 Chronic obstructive pulmonary disease, unspecified: Secondary | ICD-10-CM | POA: Diagnosis not present

## 2017-03-10 DIAGNOSIS — I2510 Atherosclerotic heart disease of native coronary artery without angina pectoris: Secondary | ICD-10-CM | POA: Diagnosis not present

## 2017-03-10 DIAGNOSIS — F329 Major depressive disorder, single episode, unspecified: Secondary | ICD-10-CM | POA: Diagnosis not present

## 2017-03-10 DIAGNOSIS — K219 Gastro-esophageal reflux disease without esophagitis: Secondary | ICD-10-CM | POA: Diagnosis not present

## 2017-03-10 DIAGNOSIS — R079 Chest pain, unspecified: Secondary | ICD-10-CM

## 2017-03-10 DIAGNOSIS — M549 Dorsalgia, unspecified: Secondary | ICD-10-CM

## 2017-03-10 DIAGNOSIS — F1010 Alcohol abuse, uncomplicated: Secondary | ICD-10-CM

## 2017-03-10 DIAGNOSIS — F32A Depression, unspecified: Secondary | ICD-10-CM

## 2017-03-10 DIAGNOSIS — D509 Iron deficiency anemia, unspecified: Secondary | ICD-10-CM | POA: Diagnosis not present

## 2017-03-10 DIAGNOSIS — D472 Monoclonal gammopathy: Secondary | ICD-10-CM | POA: Diagnosis not present

## 2017-03-10 MED ORDER — BUSPIRONE HCL 5 MG PO TABS: ORAL_TABLET | ORAL | 0 refills | Status: DC

## 2017-03-10 NOTE — Progress Notes (Signed)
Pre-visit discussion using our clinic review tool. No additional management support is needed unless otherwise documented below in the visit note.  

## 2017-03-10 NOTE — Progress Notes (Signed)
Patient ID: Troy Lopez, male   DOB: [birth year], 76 y.o.   MRN: [id number]   Subjective:    Patient ID: Troy Lopez, male    DOB: [birth year], 76 y.o.   MRN: [id number]  HPI  Patient here for hospital follow up.  He was admitted 03/03/17 for chest pain.  Had negative stress test  - EF 67%.  Was found to have guaic positive stools.  GI evaluated.  EGD essentially normal. Colonoscopy 47m polyp in the rectum.  States still having some liquid/solid stool.  No watery diarrhea.  Has known afib.  Was informed to resume eliquis after three days.  CXR in the hospital revealed chronic scarring.  Since his discharge, he has had no further chest pain.  Due to f/u with cardiology - end of this week.  Is having increased back pain.  Has some chronic low back pain, but states that over the last 1-2 days - pain left flank.  Is some better.  Hurts with certain positions.  No pain radiating down his leg.  Some fatigue.  hgb down.  Due to f/u with Dr FGrayland Ormondthis week.  No abdominal pain.  No acid reflux.  Does report increased anxiety. States needs something in the evening to help him relax.  Has cut down on his alcohol intake.  Sodium on last check in hospital - better.  Seeing nephrology.  Trying to restrict his fluid intake.    Past Medical History:  Diagnosis Date  . Adrenal gland anomaly    enlargement  . CAD (coronary artery disease)   . Carpal tunnel syndrome   . Colonic polyp   . COPD (chronic obstructive pulmonary disease) ([place]   . Degenerative disc disease, lumbar   . Depression   . Diverticulosis   . Dyspnea   . GERD (gastroesophageal reflux disease)   . Hypercholesterolemia   . Hyperkalemia   . Hypertension   . Irritable bowel syndrome   . Monoclonal gammopathy   . Neuropathy   . Personal history of tobacco use, presenting hazards to health 08/17/2015  . Sleep apnea    Past Surgical History:  Procedure Laterality Date  . BUNIONECTOMY  1989  . CARPAL TUNNEL RELEASE Left 2011   ulnar nerve sub muscular at elbow  . CHOLECYSTECTOMY  09/07  . COLONOSCOPY WITH PROPOFOL N/A 03/05/2017   Procedure: COLONOSCOPY WITH PROPOFOL;  Surgeon: WLucilla Lame MD;  [facility];  Service: Endoscopy;  Laterality: N/A;  . ELECTROPHYSIOLOGIC STUDY N/A 11/15/2015   Procedure: CARDIOVERSION;  Surgeon: AIsaias Cowman MD;  Location: [facility] ORS;  Service: Cardiovascular;  Laterality: N/A;  . ESOPHAGOGASTRODUODENOSCOPY (EGD) WITH PROPOFOL N/A 03/05/2017   Procedure: ESOPHAGOGASTRODUODENOSCOPY (EGD) WITH PROPOFOL;  Surgeon: WLucilla Lame MD;  Location: [facility] ENDOSCOPY;  Service: Endoscopy;  Laterality: N/A;  . EYE SURGERY Bilateral 2010   cataract  . HEMORRHOID SURGERY    . KYPHOPLASTY N/A 11/04/2016   Procedure: KYPHOPLASTY T 12;  Surgeon: MHessie Knows MD;  Location: [facility] ORS;  Service: Orthopedics;  Laterality: N/A;   Family History  Problem Relation Age of Onset  . Stroke Mother   . Heart disease Father        MI - 662  . Colon cancer Neg Hx   . Prostate cancer Neg Hx    Social History   Social History  . Marital status: Married    Spouse name: N/A  . Number of children: 3  . Years of education: N/A   Social History Main  Topics  . Smoking status: Former Smoker    Quit date: 11/01/2015  . Smokeless tobacco: Never Used     Comment: about 2 cigarettes per day, trying to quit  . Alcohol use 25.2 oz/week    42 Cans of beer per week     Comment: occas  . Drug use: No  . Sexual activity: Not Asked   Other Topics Concern  . None   Social History Narrative  . None    Outpatient Encounter Prescriptions as of 03/10/2017  Medication Sig  . acetaminophen (TYLENOL) 500 MG tablet Take 500-1,000 mg by mouth 3 (three) times daily as needed for moderate pain or headache.  . albuterol (PROVENTIL HFA;VENTOLIN HFA) 108 (90 Base) MCG/ACT inhaler Inhale 2 puffs into the lungs every 6 (six) hours as needed for wheezing or shortness of breath.  Marland Kitchen alendronate (FOSAMAX) 70 MG  tablet Take 70 mg by mouth once a week. Take with a full glass of water on an empty stomach. Takes on Sundays  . amLODipine (NORVASC) 10 MG tablet Take 1 tablet (5 mg total) by mouth daily.  . budesonide (ENTOCORT EC) 3 MG 24 hr capsule Take 9 mg by mouth daily as needed (colitis flare).   Marland Kitchen ELIQUIS 5 MG TABS tablet Take 5 mg by mouth 2 (two) times daily.   Marland Kitchen FIBER PO Take 2 capsules by mouth daily.  . fluticasone (FLONASE) 50 MCG/ACT nasal spray Place 2 sprays into both nostrils daily.  . fluticasone furoate-vilanterol (BREO ELLIPTA) 100-25 MCG/INH AEPB Inhale 1 puff into the lungs daily.  . furosemide (LASIX) 20 MG tablet Take 1 tablet (20 mg total) by mouth daily as needed.  . gabapentin (NEURONTIN) 100 MG capsule Take two each morning, two each afternoon and three at bedtime (Patient taking differently: 3 (three) times daily as needed. Take two each morning, two each afternoon and three at bedtime as needed for foot pain)  . isosorbide mononitrate (IMDUR) 30 MG 24 hr tablet Take 30 mg by mouth daily.   Marland Kitchen losartan (COZAAR) 100 MG tablet Take 1 tablet (100 mg total) by mouth daily.  . metoprolol tartrate (LOPRESSOR) 100 MG tablet Take 50 mg by mouth 2 (two) times daily.   . Multiple Vitamin (MULTIVITAMIN WITH MINERALS) TABS tablet Take 1 tablet by mouth daily.  . mupirocin ointment (BACTROBAN) 2 % Place 1 application into the nose 2 (two) times daily.  . pantoprazole (PROTONIX) 40 MG tablet Take 1 tablet (40 mg total) by mouth 2 (two) times daily.  . sertraline (ZOLOFT) 50 MG tablet Take 1 tablet (50 mg total) by mouth daily.  . vitamin B-12 (CYANOCOBALAMIN) 1000 MCG tablet Take 1,000 mcg by mouth daily.   . busPIRone (BUSPAR) 5 MG tablet Take one tablet q pm prn   No facility-administered encounter medications on file as of 03/10/2017.     Review of Systems  Constitutional: Negative for appetite change and unexpected weight change.  HENT: Negative for congestion and sinus pressure.     Respiratory: Negative for cough and chest tightness.        Breathing overall stable.   Cardiovascular: Negative for palpitations and leg swelling.       No chest pain since discharge.   Gastrointestinal: Negative for abdominal pain, nausea and vomiting.  Genitourinary: Negative for difficulty urinating and dysuria.  Musculoskeletal: Positive for back pain. Negative for joint swelling.  Skin: Negative for color change and rash.  Neurological: Negative for dizziness and headaches.  Psychiatric/Behavioral: Negative for  agitation and dysphoric mood.       Some increased anxiety as outlined.         Objective:    Physical Exam  Constitutional: He appears well-developed and well-nourished. No distress.  HENT:  Nose: Nose normal.  Mouth/Throat: Oropharynx is clear and moist.  Neck: Neck supple. No thyromegaly present.  Cardiovascular: Normal rate and regular rhythm.   Pulmonary/Chest: Effort normal and breath sounds normal. No respiratory distress.  Abdominal: Soft. Bowel sounds are normal. There is no tenderness.  Musculoskeletal: He exhibits no edema or tenderness.  No back pain to palpation.  No CVA tenderness.  Improved pain with lying down.    Lymphadenopathy:    He has no cervical adenopathy.  Skin: No rash noted. No erythema.  Psychiatric: He has a normal mood and affect. His behavior is normal.    BP 128/68 (BP Location: Left Arm, Patient Position: Sitting, Cuff Size: Large)   Pulse 60   Temp 98.6 F (37 C) (Oral)   Resp 12   Ht '5\' 9"'$  (1.753 m)   Wt 189 lb 6.4 oz (85.9 kg)   SpO2 94%   BMI 27.97 kg/m  Wt Readings from Last 3 Encounters:  03/10/17 189 lb 6.4 oz (85.9 kg)  03/03/17 190 lb (86.2 kg)  01/28/17 186 lb 3.2 oz (84.5 kg)     Lab Results  Component Value Date   WBC 8.5 03/04/2017   HGB 9.2 (L) 03/05/2017   HCT 25.3 (L) 03/04/2017   PLT 385 03/04/2017   GLUCOSE 100 (H) 03/03/2017   CHOL 134 02/14/2016   TRIG 42.0 02/14/2016   HDL 74.80 02/14/2016    LDLCALC 51 02/14/2016   ALT 14 (L) 12/27/2016   AST 19 12/27/2016   NA 134 (L) 03/03/2017   K 4.3 03/03/2017   CL 101 03/03/2017   CREATININE 0.94 03/03/2017   BUN 14 03/03/2017   CO2 23 03/03/2017   TSH 1.51 01/28/2017   PSA 1.33 02/14/2016   INR 1.11 01/08/2017   HGBA1C 5.7 03/04/2016    Dg Chest 2 View  Result Date: 03/03/2017 CLINICAL DATA:  Right chest pain, nonproductive cough EXAM: CHEST  2 VIEW COMPARISON:  12/13/2016 FINDINGS: Chronic scarring in the right upper lobe and lingula. No focal consolidation. No pleural effusion or pneumothorax. Nipple shadow overlying the left lower lung. The heart is normal in size. Degenerative changes of the visualized thoracolumbar spine. IMPRESSION: No evidence of acute cardiopulmonary disease. Chronic scarring in the lungs bilaterally. Electronically Signed   By: Julian Hy M.D.   On: 03/03/2017 07:43   Nm Myocar Multi W/spect W/wall Motion / Ef  Result Date: 03/04/2017  The study is normal.  This is a low risk study.  The left ventricular ejection fraction is hyperdynamic (>65%).  Nuclear stress EF: 67%.  Normal Myoview portion stress Await images       Assessment & Plan:   Problem List Items Addressed This Visit    Alcohol abuse    Has cut back.  Discussed the need to quit.  Follow.        CAD (coronary artery disease)    Followed by cardiology.  W/up as outlined.  Keep f/u app.       Chest pain    Recently admitted with chest pain.  W/up as outlined.  No pain since his hospitalization.  Has f/u planned with cardiology - end of week.       Chronic back pain    Has chronic  back pain.  With acute pain now - left flank.  Different from his chronic pain.  Worsens in certain positions.  Desires no further evaluation today.  Is some better.  Planning to see ortho in two days.        Collagenous colitis    Has had GI w/up.  Just had f/u colonoscopy.  Bowels as outlined.  Continue f/u with GI.       Depression    On  zoloft.  Describes some nervousness at night.  Request to have something if needed.  Add buspar 11m.        Relevant Medications   busPIRone (BUSPAR) 5 MG tablet   GERD (gastroesophageal reflux disease)    Controlled on protonix.       Hypertension    Blood pressure under reasonable control.  Same medication regimen.  Follow pressures.  Follow metabolic panel.       Hyponatremia    Has decreased alcohol intake.  Has been trying to restrict fluid intake.  Seeing nephrology.  Sodium in hospital = improved.        Iron deficiency anemia    Followed by hematology.        MGUS (monoclonal gammopathy of unknown significance)    Evaluated by hematology.  S/p bone marrow biopsy.  With worsening anemia.  Has f/u planned with hematology this week.  Wanted to hold on recheck labs today.        Moderate COPD (chronic obstructive pulmonary disease) ([facility])    Breathing overall stable.  Has been followed by pulmonary.            SEinar Pheasant MD

## 2017-03-12 ENCOUNTER — Ambulatory Visit: Payer: PPO

## 2017-03-12 ENCOUNTER — Encounter: Payer: Self-pay | Admitting: Internal Medicine

## 2017-03-12 DIAGNOSIS — M4696 Unspecified inflammatory spondylopathy, lumbar region: Secondary | ICD-10-CM | POA: Diagnosis not present

## 2017-03-12 DIAGNOSIS — M4316 Spondylolisthesis, lumbar region: Secondary | ICD-10-CM | POA: Diagnosis not present

## 2017-03-12 NOTE — Assessment & Plan Note (Signed)
Blood pressure under reasonable control.  Same medication regimen.  Follow pressures.  Follow metabolic panel.   

## 2017-03-12 NOTE — Assessment & Plan Note (Signed)
Followed by cardiology.  W/up as outlined.  Keep f/u app.

## 2017-03-12 NOTE — Assessment & Plan Note (Signed)
Breathing overall stable.  Has been followed by pulmonary.

## 2017-03-12 NOTE — Assessment & Plan Note (Signed)
Evaluated by hematology.  S/p bone marrow biopsy.  With worsening anemia.  Has f/u planned with hematology this week.  Wanted to hold on recheck labs today.

## 2017-03-12 NOTE — Assessment & Plan Note (Signed)
Controlled on protonix.   

## 2017-03-12 NOTE — Assessment & Plan Note (Signed)
Has cut back.  Discussed the need to quit.  Follow.

## 2017-03-12 NOTE — Progress Notes (Signed)
[name]  [phone]) [phone] [phone]  ID: Troy Lopez OB: May 12, 1941  MR#: [id number]  DXA#:[id number]  Patient Care Team: Einar Pheasant, MD as PCP - General (Internal Medicine)  CHIEF COMPLAINT: [name]'s macroglobulinemia  INTERVAL HISTORY: Patient is referred back to clinic today after the hospital admission for probable lower GI bleed. He feels improved since his discharge, but not back to his baseline. He continues to have mild weakness and fatigue. He has no neurologic complaints. He denies any fevers. He has a good appetite and denies weight loss. He has no chest pain or shortness of breath. He denies any nausea, vomiting, constipation, or diarrhea. He has no urinary complaints. Patient offers no further specific complaints today.  REVIEW OF SYSTEMS:   Review of Systems  Constitutional: Positive for malaise/fatigue. Negative for fever and weight loss.  Respiratory: Negative.  Negative for cough and shortness of breath.   Cardiovascular: Negative.  Negative for chest pain and leg swelling.  Gastrointestinal: Positive for blood in stool. Negative for abdominal pain, diarrhea and melena.  Musculoskeletal: Positive for back pain.  Skin: Negative.  Negative for rash.  Neurological: Positive for weakness. Negative for sensory change.  Psychiatric/Behavioral: Negative.  The patient is not nervous/anxious.     As per HPI. Otherwise, a complete review of systems is negative.  PAST MEDICAL HISTORY: Past Medical History:  Diagnosis Date  . Adrenal gland anomaly    enlargement  . CAD (coronary artery disease)   . Carpal tunnel syndrome   . Colonic polyp   . COPD (chronic obstructive pulmonary disease) ([place])   . Degenerative disc disease, lumbar   . Depression   . Diverticulosis   . Dyspnea   . GERD (gastroesophageal reflux disease)   . Hypercholesterolemia   . Hyperkalemia   . Hypertension   . Irritable bowel syndrome   .  Monoclonal gammopathy   . Neuropathy   . Personal history of tobacco use, presenting hazards to health 08/17/2015  . Sleep apnea     PAST SURGICAL HISTORY: Past Surgical History:  Procedure Laterality Date  . BUNIONECTOMY  1989  . CARPAL TUNNEL RELEASE Left 2011   ulnar nerve sub muscular at elbow  . CHOLECYSTECTOMY  09/07  . COLONOSCOPY WITH PROPOFOL N/A 03/05/2017   Procedure: COLONOSCOPY WITH PROPOFOL;  Surgeon: Lucilla Lame, MD;  Location: [facility] END[facility]COPY;  Service: Endoscopy;  Laterality: N/A;  . ELECTROPHYSIOLOGIC STUDY N/A 11/15/2015   Procedure: CARDIOVERSION;  Surgeon: Isaias Cowman, MD;  Location: [facility] ORS;  Service: Cardiovascular;  Laterality: N/A;  . ESOPHAGOGASTRODUODENOSCOPY (EGD) WITH PROPOFOL N/A 03/05/2017   Procedure: ESOPHAGOGASTRODUODENOSCOPY (EGD) WITH PROPOFOL;  Surgeon: Lucilla Lame, MD;  Location: [facility] ENDOSCOPY;  Service: Endoscopy;  Laterality: N/A;  . EYE SURGERY Bilateral 2010   cataract  . HEMORRHOID SURGERY    . KYPHOPLASTY N/A 11/04/2016   Procedure: KYPHOPLASTY T 12;  Surgeon: Hessie Knows, MD;  Location: [facility] ORS;  Service: Orthopedics;  Laterality: N/A;    FAMILY HISTORY Family History  Problem Relation Age of Onset  . Stroke Mother   . Heart disease Father        MI - 87   . Colon cancer Neg Hx   . Prostate cancer Neg Hx        ADVANCED DIRECTIVES:    HEALTH MAINTENANCE: Social History  Substance Use Topics  . Smoking status: Former Smoker    Quit date: 11/01/2015  . Smokeless tobacco: Never Used     Comment: about 2 cigarettes  per day, trying to quit  . Alcohol use 25.2 oz/week    42 Cans of beer per week     Comment: occas     Colonoscopy:  PAP:  Bone density:  Lipid panel:  Allergies  Allergen Reactions  . 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 acetaminophen (TYLENOL) 500 MG  tablet Take 500-1,000 mg by mouth 3 (three) times daily as needed for moderate pain or headache.    . albuterol (PROVENTIL HFA;VENTOLIN HFA) 108 (90 Base) MCG/ACT inhaler Inhale 2 puffs into the lungs every 6 (six) hours as needed for wheezing or shortness of breath.    Marland Kitchen alendronate (FOSAMAX) 70 MG tablet Take 70 mg by mouth once a week. Take with a full glass of water on an empty stomach. Takes on Sundays    . amLODipine (NORVASC) 10 MG tablet Take 1 tablet (5 mg total) by mouth daily. 90 tablet 0  . budesonide (ENTOCORT EC) 3 MG 24 hr capsule Take 9 mg by mouth daily as needed (colitis flare).     . busPIRone (BUSPAR) 5 MG tablet Take one tablet q pm prn 20 tablet 0  . ELIQUIS 5 MG TABS tablet Take 5 mg by mouth 2 (two) times daily.     Marland Kitchen FIBER PO Take 2 capsules by mouth daily.    . fluticasone (FLONASE) 50 MCG/ACT nasal spray Place 2 sprays into both nostrils daily. 16 g 0  . fluticasone furoate-vilanterol (BREO ELLIPTA) 100-25 MCG/INH AEPB Inhale 1 puff into the lungs daily. 60 each 11  . furosemide (LASIX) 20 MG tablet Take 1 tablet (20 mg total) by mouth daily as needed. 30 tablet 5  . gabapentin (NEURONTIN) 100 MG capsule Take two each morning, two each afternoon and three at bedtime (Patient taking differently: 3 (three) times daily as needed. Take two each morning, two each afternoon and three at bedtime as needed for foot pain) 210 capsule 0  . isosorbide mononitrate (IMDUR) 30 MG 24 hr tablet Take 30 mg by mouth daily.     Marland Kitchen losartan (COZAAR) 100 MG tablet Take 1 tablet (100 mg total) by mouth daily. 90 tablet 0  . metoprolol tartrate (LOPRESSOR) 100 MG tablet Take 50 mg by mouth 2 (two) times daily.     . Multiple Vitamin (MULTIVITAMIN WITH MINERALS) TABS tablet Take 1 tablet by mouth daily.    . mupirocin ointment (BACTROBAN) 2 % Place 1 application into the nose 2 (two) times daily. 22 g 0  . pantoprazole (PROTONIX) 40 MG tablet Take 1 tablet (40 mg total) by mouth 2 (two) times  daily. 180 tablet 0  . sertraline (ZOLOFT) 50 MG tablet Take 1 tablet (50 mg total) by mouth daily. 30 tablet 0  . vitamin B-12 (CYANOCOBALAMIN) 1000 MCG tablet Take 1,000 mcg by mouth daily.      No current facility-administered medications for this visit.     OBJECTIVE: Vitals:   03/13/17 0925  BP: 137/78  Pulse: 61  Resp: 18  Temp: 98.7 F (37.1 C)     There is no height or weight on file to calculate BMI.    ECOG FS:0 - Asymptomatic  General: Well-developed, well-nourished, no acute distress. Eyes: Pink conjunctiva, anicteric sclera. Lungs: Clear to auscultation bilaterally. Heart: Regular rate and rhythm. No rubs, murmurs, or gallops. Abdomen: Soft, nontender, nondistended. No organomegaly noted, normoactive bowel  sounds. Musculoskeletal: No edema, cyanosis, or clubbing. Neuro: Alert, answering all questions appropriately. Cranial nerves grossly intact. Skin: No rashes or petechiae noted. Psych: Normal affect.   LAB RESULTS:  Lab Results  Component Value Date   NA 134 (L) 03/03/2017   K 4.3 03/03/2017   CL 101 03/03/2017   CO2 23 03/03/2017   GLUCOSE 100 (H) 03/03/2017   BUN 14 03/03/2017   CREATININE 0.94 03/03/2017   CALCIUM 9.3 03/03/2017   PROT 8.3 (H) 12/27/2016   ALBUMIN 3.4 (L) 12/27/2016   AST 19 12/27/2016   ALT 14 (L) 12/27/2016   ALKPHOS 69 12/27/2016   BILITOT 0.3 12/27/2016   GFRNONAA >60 03/03/2017   GFRAA >60 03/03/2017    Lab Results  Component Value Date   WBC 8.5 03/04/2017   NEUTROABS 5.0 01/08/2017   HGB 9.2 (L) 03/05/2017   HCT 25.3 (L) 03/04/2017   MCV 95.9 03/04/2017   PLT 385 03/04/2017   Lab Results  Component Value Date   IRON 113 11/22/2016   TIBC 1,170 (H) 11/22/2016   IRONPCTSAT 10 (L) 11/22/2016    Lab Results  Component Value Date   FERRITIN 28 11/22/2016   Lab Results  Component Value Date   TOTALPROTELP 7.9 12/27/2016   ALBUMINELP 3.1 12/27/2016   A1GS 0.3 12/27/2016   A2GS 0.6 12/27/2016   BETS 1.3  12/27/2016   BETA2SER 0.2 02/14/2016   GAMS 2.6 (H) 12/27/2016   MSPIKE 1.1 (H) 12/27/2016   SPEI Comment 12/27/2016    STUDIES: Dg Chest 2 View  Result Date: 03/03/2017 CLINICAL DATA:  Right chest pain, nonproductive cough EXAM: CHEST  2 VIEW COMPARISON:  12/13/2016 FINDINGS: Chronic scarring in the right upper lobe and lingula. No focal consolidation. No pleural effusion or pneumothorax. Nipple shadow overlying the left lower lung. The heart is normal in size. Degenerative changes of the visualized thoracolumbar spine. IMPRESSION: No evidence of acute cardiopulmonary disease. Chronic scarring in the lungs bilaterally. Electronically Signed   By: Julian Hy M.D.   On: 03/03/2017 07:43   Nm Myocar Multi W/spect W/wall Motion / Ef  Result Date: 03/04/2017  The study is normal.  This is a low risk study.  The left ventricular ejection fraction is hyperdynamic (>65%).  Nuclear stress EF: 67%.  Normal Myoview portion stress Await images    ASSESSMENT: [name]'s macroglobulinemia.  PLAN:    1. [name]'s macroglobulinemia:  Bone marrow biopsy results reviewed independently confirming underlying [name]'s. Patient's most recent M spike from Dec 27, 2016 decreased and is now 1.1, today's result is pending.  His IgM component is elevated, but essentially unchanged at 2931 today. Patient has iron deficiency anemia, but no other evidence of endorgan damage. He does not require any treatment at this time. If he had progression of disease as evidenced by B-symptoms or endorgan damage, will consider Rituxan-based therapy.   2. Iron deficiency anemia: Patient's hemoglobin and iron stores have decreased likely secondary to his recent GI bleed. Return to clinic in 1 and 2 weeks to receive 510 mg IV Feraheme. Patient will then return to clinic in 3 months with repeat laboratory work and further evaluation. 3. Hyponatremia: Mild, monitor.  Approximately 30 minutes was spent in discussion  of which greater than 50% was consultation.  Patient expressed understanding and was in agreement with this plan. He also understands that He can call clinic at any time with any questions, concerns, or complaints.    Lloyd Huger, MD   03/13/2017 9:39 AM

## 2017-03-12 NOTE — Assessment & Plan Note (Signed)
Has decreased alcohol intake.  Has been trying to restrict fluid intake.  Seeing nephrology.  Sodium in hospital = improved.

## 2017-03-12 NOTE — Assessment & Plan Note (Signed)
Followed by hematology 

## 2017-03-12 NOTE — Assessment & Plan Note (Signed)
On zoloft.  Describes some nervousness at night.  Request to have something if needed.  Add buspar 5mg .

## 2017-03-12 NOTE — Assessment & Plan Note (Signed)
Recently admitted with chest pain.  W/up as outlined.  No pain since his hospitalization.  Has f/u planned with cardiology - end of week.

## 2017-03-12 NOTE — Assessment & Plan Note (Signed)
Has had GI w/up.  Just had f/u colonoscopy.  Bowels as outlined.  Continue f/u with GI.

## 2017-03-12 NOTE — Assessment & Plan Note (Signed)
Has chronic back pain.  With acute pain now - left flank.  Different from his chronic pain.  Worsens in certain positions.  Desires no further evaluation today.  Is some better.  Planning to see ortho in two days.

## 2017-03-13 ENCOUNTER — Inpatient Hospital Stay: Payer: PPO

## 2017-03-13 ENCOUNTER — Telehealth: Payer: Self-pay | Admitting: Pulmonary Disease

## 2017-03-13 ENCOUNTER — Inpatient Hospital Stay: Payer: PPO | Attending: Oncology | Admitting: Oncology

## 2017-03-13 VITALS — BP 137/78 | HR 61 | Temp 98.7°F | Resp 18

## 2017-03-13 DIAGNOSIS — M5136 Other intervertebral disc degeneration, lumbar region: Secondary | ICD-10-CM | POA: Insufficient documentation

## 2017-03-13 DIAGNOSIS — D509 Iron deficiency anemia, unspecified: Secondary | ICD-10-CM | POA: Diagnosis not present

## 2017-03-13 DIAGNOSIS — R531 Weakness: Secondary | ICD-10-CM | POA: Diagnosis not present

## 2017-03-13 DIAGNOSIS — G629 Polyneuropathy, unspecified: Secondary | ICD-10-CM | POA: Insufficient documentation

## 2017-03-13 DIAGNOSIS — G4730 Sleep apnea, unspecified: Secondary | ICD-10-CM | POA: Insufficient documentation

## 2017-03-13 DIAGNOSIS — D472 Monoclonal gammopathy: Secondary | ICD-10-CM | POA: Insufficient documentation

## 2017-03-13 DIAGNOSIS — K219 Gastro-esophageal reflux disease without esophagitis: Secondary | ICD-10-CM | POA: Insufficient documentation

## 2017-03-13 DIAGNOSIS — R5383 Other fatigue: Secondary | ICD-10-CM | POA: Diagnosis not present

## 2017-03-13 DIAGNOSIS — I10 Essential (primary) hypertension: Secondary | ICD-10-CM

## 2017-03-13 DIAGNOSIS — K589 Irritable bowel syndrome without diarrhea: Secondary | ICD-10-CM | POA: Insufficient documentation

## 2017-03-13 DIAGNOSIS — F329 Major depressive disorder, single episode, unspecified: Secondary | ICD-10-CM | POA: Diagnosis not present

## 2017-03-13 DIAGNOSIS — E875 Hyperkalemia: Secondary | ICD-10-CM | POA: Insufficient documentation

## 2017-03-13 DIAGNOSIS — Z8719 Personal history of other diseases of the digestive system: Secondary | ICD-10-CM | POA: Diagnosis not present

## 2017-03-13 DIAGNOSIS — C880 Waldenstrom macroglobulinemia: Secondary | ICD-10-CM | POA: Diagnosis not present

## 2017-03-13 DIAGNOSIS — Z79899 Other long term (current) drug therapy: Secondary | ICD-10-CM | POA: Insufficient documentation

## 2017-03-13 DIAGNOSIS — J449 Chronic obstructive pulmonary disease, unspecified: Secondary | ICD-10-CM | POA: Diagnosis not present

## 2017-03-13 DIAGNOSIS — Z86010 Personal history of colonic polyps: Secondary | ICD-10-CM | POA: Insufficient documentation

## 2017-03-13 DIAGNOSIS — E871 Hypo-osmolality and hyponatremia: Secondary | ICD-10-CM | POA: Diagnosis not present

## 2017-03-13 DIAGNOSIS — Z87891 Personal history of nicotine dependence: Secondary | ICD-10-CM | POA: Diagnosis not present

## 2017-03-13 DIAGNOSIS — I2510 Atherosclerotic heart disease of native coronary artery without angina pectoris: Secondary | ICD-10-CM | POA: Insufficient documentation

## 2017-03-13 DIAGNOSIS — E7800 Pure hypercholesterolemia, unspecified: Secondary | ICD-10-CM | POA: Diagnosis not present

## 2017-03-13 LAB — CBC WITH DIFFERENTIAL/PLATELET
Basophils Absolute: 0.1 10*3/uL (ref 0–0.1)
Basophils Relative: 1 %
EOS ABS: 0 10*3/uL (ref 0–0.7)
EOS PCT: 1 %
HCT: 28.1 % — ABNORMAL LOW (ref 40.0–52.0)
HEMOGLOBIN: 9.6 g/dL — AB (ref 13.0–18.0)
LYMPHS PCT: 20 %
Lymphs Abs: 1.6 10*3/uL (ref 1.0–3.6)
MCH: 32.7 pg (ref 26.0–34.0)
MCHC: 34.1 g/dL (ref 32.0–36.0)
MCV: 95.9 fL (ref 80.0–100.0)
MONOS PCT: 10 %
Monocytes Absolute: 0.8 10*3/uL (ref 0.2–1.0)
Neutro Abs: 5.6 10*3/uL (ref 1.4–6.5)
Neutrophils Relative %: 68 %
PLATELETS: 585 10*3/uL — AB (ref 150–440)
RBC: 2.93 MIL/uL — AB (ref 4.40–5.90)
RDW: 15.5 % — ABNORMAL HIGH (ref 11.5–14.5)
WBC: 8.1 10*3/uL (ref 3.8–10.6)

## 2017-03-13 LAB — IRON AND TIBC
Iron: 51 ug/dL (ref 45–182)
SATURATION RATIOS: 5 % — AB (ref 17.9–39.5)
TIBC: 1084 ug/dL — ABNORMAL HIGH (ref 250–450)
UIBC: 1033 ug/dL

## 2017-03-13 LAB — FERRITIN: Ferritin: 17 ng/mL — ABNORMAL LOW (ref 24–336)

## 2017-03-13 NOTE — Telephone Encounter (Signed)
Patient dropped off assistance forms for [employer].  Placed in lbpu nurse box,.

## 2017-03-13 NOTE — Telephone Encounter (Signed)
Forms in black tray inside pod.[place]

## 2017-03-14 DIAGNOSIS — J410 Simple chronic bronchitis: Secondary | ICD-10-CM | POA: Diagnosis not present

## 2017-03-14 DIAGNOSIS — R0789 Other chest pain: Secondary | ICD-10-CM | POA: Diagnosis not present

## 2017-03-14 DIAGNOSIS — E7800 Pure hypercholesterolemia, unspecified: Secondary | ICD-10-CM | POA: Diagnosis not present

## 2017-03-14 DIAGNOSIS — Z9889 Other specified postprocedural states: Secondary | ICD-10-CM | POA: Diagnosis not present

## 2017-03-14 DIAGNOSIS — G4733 Obstructive sleep apnea (adult) (pediatric): Secondary | ICD-10-CM | POA: Diagnosis not present

## 2017-03-14 DIAGNOSIS — Z720 Tobacco use: Secondary | ICD-10-CM | POA: Diagnosis not present

## 2017-03-14 DIAGNOSIS — I10 Essential (primary) hypertension: Secondary | ICD-10-CM | POA: Diagnosis not present

## 2017-03-14 LAB — IGG, IGA, IGM
IGG (IMMUNOGLOBIN G), SERUM: 463 mg/dL — AB (ref 700–1600)
IgA: 55 mg/dL — ABNORMAL LOW (ref 61–437)
IgM, Serum: 2931 mg/dL — ABNORMAL HIGH (ref 15–143)

## 2017-03-14 LAB — KAPPA/LAMBDA LIGHT CHAINS
KAPPA FREE LGHT CHN: 81.4 mg/L — AB (ref 3.3–19.4)
Kappa, lambda light chain ratio: 6.17 — ABNORMAL HIGH (ref 0.26–1.65)
LAMDA FREE LIGHT CHAINS: 13.2 mg/L (ref 5.7–26.3)

## 2017-03-14 MED ORDER — FLUTICASONE FUROATE-VILANTEROL 100-25 MCG/INH IN AEPB: 1.0000 | INHALATION_SPRAY | Freq: Every day | RESPIRATORY_TRACT | 4 refills | Status: DC

## 2017-03-14 NOTE — Telephone Encounter (Signed)
Rx for [employer] and put in DS folder along with application for [place].

## 2017-03-14 NOTE — Addendum Note (Signed)
Addended by: Devona Konig on: 03/14/2017 02:54 PM   Modules accepted: Orders

## 2017-03-17 ENCOUNTER — Encounter: Payer: Self-pay | Admitting: Internal Medicine

## 2017-03-17 LAB — PROTEIN ELECTROPHORESIS, SERUM
A/G RATIO SPE: 0.8 (ref 0.7–1.7)
ALPHA-1-GLOBULIN: 0.3 g/dL (ref 0.0–0.4)
ALPHA-2-GLOBULIN: 0.6 g/dL (ref 0.4–1.0)
Albumin ELP: 3.4 g/dL (ref 2.9–4.4)
Beta Globulin: 1.2 g/dL (ref 0.7–1.3)
GLOBULIN, TOTAL: 4.5 g/dL — AB (ref 2.2–3.9)
Gamma Globulin: 2.5 g/dL — ABNORMAL HIGH (ref 0.4–1.8)
M-Spike, %: 1.1 g/dL — ABNORMAL HIGH
Total Protein ELP: 7.9 g/dL (ref 6.0–8.5)

## 2017-03-18 ENCOUNTER — Inpatient Hospital Stay: Payer: PPO

## 2017-03-18 VITALS — BP 119/61 | HR 61 | Temp 98.2°F | Resp 16

## 2017-03-18 DIAGNOSIS — D509 Iron deficiency anemia, unspecified: Secondary | ICD-10-CM

## 2017-03-18 DIAGNOSIS — C880 Waldenstrom macroglobulinemia: Secondary | ICD-10-CM | POA: Diagnosis not present

## 2017-03-18 MED ORDER — SODIUM CHLORIDE 0.9 % IV SOLN
510.0000 mg | Freq: Once | INTRAVENOUS | Status: AC
  Administered 2017-03-18: 510 mg via INTRAVENOUS
  Filled 2017-03-18: qty 17

## 2017-03-18 MED ORDER — SODIUM CHLORIDE 0.9 % IV SOLN
Freq: Once | INTRAVENOUS | Status: AC
  Administered 2017-03-18: 12:00:00 via INTRAVENOUS
  Filled 2017-03-18: qty 1000

## 2017-03-18 NOTE — Patient Instructions (Signed)

## 2017-03-19 ENCOUNTER — Other Ambulatory Visit: Payer: Self-pay | Admitting: Internal Medicine

## 2017-03-19 ENCOUNTER — Ambulatory Visit: Payer: PPO

## 2017-03-19 MED ORDER — BUSPIRONE HCL 10 MG PO TABS: 10.0000 mg | ORAL_TABLET | Freq: Every evening | ORAL | 1 refills | Status: DC

## 2017-03-19 NOTE — Telephone Encounter (Signed)
rx sent in for buspar 10mg  (#30) with 1 refill.

## 2017-03-19 NOTE — Telephone Encounter (Signed)
Papers and RX signed by DS and faxed to [place]. Nothing further needed.

## 2017-03-21 DIAGNOSIS — E875 Hyperkalemia: Secondary | ICD-10-CM | POA: Diagnosis not present

## 2017-03-21 DIAGNOSIS — I10 Essential (primary) hypertension: Secondary | ICD-10-CM | POA: Diagnosis not present

## 2017-03-21 DIAGNOSIS — E871 Hypo-osmolality and hyponatremia: Secondary | ICD-10-CM | POA: Diagnosis not present

## 2017-03-21 DIAGNOSIS — C880 Waldenstrom macroglobulinemia: Secondary | ICD-10-CM | POA: Diagnosis not present

## 2017-03-23 ENCOUNTER — Encounter: Payer: Self-pay | Admitting: Gastroenterology

## 2017-03-25 ENCOUNTER — Inpatient Hospital Stay: Payer: PPO | Attending: Oncology

## 2017-03-25 VITALS — BP 116/67 | HR 64 | Temp 95.8°F | Resp 18

## 2017-03-25 DIAGNOSIS — Z79899 Other long term (current) drug therapy: Secondary | ICD-10-CM | POA: Diagnosis not present

## 2017-03-25 DIAGNOSIS — C880 Waldenstrom macroglobulinemia: Secondary | ICD-10-CM | POA: Insufficient documentation

## 2017-03-25 DIAGNOSIS — D509 Iron deficiency anemia, unspecified: Secondary | ICD-10-CM

## 2017-03-25 MED ORDER — FERUMOXYTOL INJECTION 510 MG/17 ML
INTRAVENOUS | Status: AC
  Filled 2017-03-25: qty 17

## 2017-03-25 MED ORDER — SODIUM CHLORIDE 0.9 % IV SOLN
510.0000 mg | Freq: Once | INTRAVENOUS | Status: AC
  Administered 2017-03-25: 510 mg via INTRAVENOUS
  Filled 2017-03-25: qty 17

## 2017-03-25 MED ORDER — SODIUM CHLORIDE 0.9 % IV SOLN
Freq: Once | INTRAVENOUS | Status: AC
  Administered 2017-03-25: 11:00:00 via INTRAVENOUS
  Filled 2017-03-25: qty 1000

## 2017-03-25 NOTE — Patient Instructions (Signed)

## 2017-03-26 ENCOUNTER — Ambulatory Visit
Admission: RE | Admit: 2017-03-26 | Discharge: 2017-03-26 | Disposition: A | Discharge status: Home / Self Care | Payer: PPO | Source: Ambulatory Visit | Attending: Nephrology | Admitting: Nephrology

## 2017-03-26 DIAGNOSIS — I712 Thoracic aortic aneurysm, without rupture: Secondary | ICD-10-CM | POA: Diagnosis not present

## 2017-03-26 DIAGNOSIS — E871 Hypo-osmolality and hyponatremia: Secondary | ICD-10-CM

## 2017-03-26 DIAGNOSIS — I2510 Atherosclerotic heart disease of native coronary artery without angina pectoris: Secondary | ICD-10-CM | POA: Diagnosis not present

## 2017-03-26 DIAGNOSIS — J439 Emphysema, unspecified: Secondary | ICD-10-CM | POA: Insufficient documentation

## 2017-03-26 DIAGNOSIS — D3501 Benign neoplasm of right adrenal gland: Secondary | ICD-10-CM | POA: Diagnosis not present

## 2017-03-26 DIAGNOSIS — I700 Atherosclerosis of aorta: Secondary | ICD-10-CM | POA: Insufficient documentation

## 2017-03-26 DIAGNOSIS — Z87891 Personal history of nicotine dependence: Secondary | ICD-10-CM | POA: Diagnosis not present

## 2017-03-26 DIAGNOSIS — R0602 Shortness of breath: Secondary | ICD-10-CM | POA: Diagnosis not present

## 2017-04-08 ENCOUNTER — Other Ambulatory Visit: Payer: Self-pay | Admitting: Internal Medicine

## 2017-04-09 ENCOUNTER — Encounter: Payer: Self-pay | Admitting: Gastroenterology

## 2017-04-09 ENCOUNTER — Ambulatory Visit (INDEPENDENT_AMBULATORY_CARE_PROVIDER_SITE_OTHER): Payer: PPO | Admitting: Gastroenterology

## 2017-04-09 VITALS — BP 155/86 | HR 57 | Temp 97.6°F | Ht 69.0 in | Wt 183.6 lb

## 2017-04-09 DIAGNOSIS — R197 Diarrhea, unspecified: Secondary | ICD-10-CM | POA: Diagnosis not present

## 2017-04-09 NOTE — Patient Instructions (Signed)
1. Stool for C Diff if diarrhea persists 2. Decrease protonix to once a day 3. Start miralax daily during episodes of constipation   Please call our office to speak with my nurse Driscilla Grammes at [phone] during business hours from 8am to 4pm if you have any questions/concerns. During after hours, you will be redirected to on call GI physician. For any emergency please call 911 or go the nearest emergency room.    Cephas Darby, MD [address]  [employer]  [place], [place] [address]  Main: [phone]  Fax: [phone]

## 2017-04-09 NOTE — Progress Notes (Signed)
Troy Darby, MD [address]  [place]  [place], [place] [address]  Main: [phone]  Fax: [phone]    Gastroenterology Consultation  Referring Provider:     Einar Pheasant, MD Primary Care Physician:  Einar Pheasant, MD Primary Gastroenterologist:  Dr. Jiles Garter  Reason for Consultation:     Hospital follow-up         HPI:   Troy Lopez is a 76 y.o. y/o male referred by Dr. Einar Pheasant, MD  for consultation & management of Anemia.  A month ago, he was admitted to [facility] with heme-positive stool and hemoglobin 8.8. He has history of collagenous colitis and takes budesonide as needed. He has been receiving iron infusions by Dr. Grayland Ormond since hospital discharge. His hemoglobin improved to 9.6. Today, he reports feeling nauseous, 3 episodes of loose stools in the morning not associated with blood. He reports mild abdominal discomfort in mid abdomen. He went [place] [employer] yesterday and had steak, potatoes. Otherwise, he reports having intermittent episodes of constipation for which he takes laxatives. Requesting for budesonide prescription as he thinks this might be the flareup of collagenous colitis. He has been on Protonix 40 mg twice daily for a few years. He denies any heartburn, epigastric pain, dysphagia, regurgitation, belching.  GI Procedures: EGD 03/05/2017 showed mild Schatzki's ring, small hiatal hernia, mild gastric erythema, normal duodenum Colonoscopy 03/05/2017 showed small rectal polyp, multiple small diverticula in sigmoid colon DIAGNOSIS:  A. RECTUM POLYP; COLD SNARE:  - FOCAL HEMORRHAGE.  - NEGATIVE FOR DYSPLASIA AND MALIGNANCY.  - MULTIPLE DEEPER LEVELS WERE EXAMINED    Past Medical History:  Diagnosis Date  . Adrenal gland anomaly    enlargement  . CAD (coronary artery disease)   . Carpal tunnel syndrome   . Colonic polyp   . COPD (chronic obstructive pulmonary disease) ([place])   . Degenerative disc disease, lumbar   . Depression    . Diverticulosis   . Dyspnea   . GERD (gastroesophageal reflux disease)   . Hypercholesterolemia   . Hyperkalemia   . Hypertension   . Irritable bowel syndrome   . Monoclonal gammopathy   . Neuropathy   . Personal history of tobacco use, presenting hazards to health 08/17/2015  . Sleep apnea     Past Surgical History:  Procedure Laterality Date  . BUNIONECTOMY  1989  . CARPAL TUNNEL RELEASE Left 2011   ulnar nerve sub muscular at elbow  . CHOLECYSTECTOMY  09/07  . COLONOSCOPY WITH PROPOFOL N/A 03/05/2017   Procedure: COLONOSCOPY WITH PROPOFOL;  Surgeon: Lucilla Lame, MD;  Location: [facility] ENDOSCOPY;  Service: Endoscopy;  Laterality: N/A;  . ELECTROPHYSIOLOGIC STUDY N/A 11/15/2015   Procedure: CARDIOVERSION;  Surgeon: Isaias Cowman, MD;  Location: [facility] ORS;  Service: Cardiovascular;  Laterality: N/A;  . ESOPHAGOGASTRODUODENOSCOPY (EGD) WITH PROPOFOL N/A 03/05/2017   Procedure: ESOPHAGOGASTRODUODENOSCOPY (EGD) WITH PROPOFOL;  Surgeon: Lucilla Lame, MD;  Location: [facility] ENDOSCOPY;  Service: Endoscopy;  Laterality: N/A;  . EYE SURGERY Bilateral 2010   cataract  . HEMORRHOID SURGERY    . KYPHOPLASTY N/A 11/04/2016   Procedure: KYPHOPLASTY T 12;  Surgeon: Hessie Knows, MD;  Location: [facility] ORS;  Service: Orthopedics;  Laterality: N/A;    Prior to Admission medications   Medication Sig Start Date End Date Taking? Authorizing Provider  acetaminophen (TYLENOL) 500 MG tablet Take 500-1,000 mg by mouth 3 (three) times daily as needed for moderate pain or headache.   Yes [provider]  albuterol (PROVENTIL HFA;VENTOLIN  HFA) 108 (90 Base) MCG/ACT inhaler Inhale 2 puffs into the lungs every 6 (six) hours as needed for wheezing or shortness of breath.   Yes [provider]  alendronate (FOSAMAX) 70 MG tablet Take 70 mg by mouth once a week. Take with a full glass of water on an empty stomach. Takes on Sundays   Yes [provider]  amLODipine (NORVASC) 10 MG  tablet Take 1 tablet (5 mg total) by mouth daily. 01/22/17  Yes Einar Pheasant, MD  budesonide (ENTOCORT EC) 3 MG 24 hr capsule Take 9 mg by mouth daily as needed (colitis flare).  05/31/15  Yes [provider]  busPIRone (BUSPAR) 10 MG tablet Take 1 tablet (10 mg total) by mouth every evening. 03/19/17  Yes Einar Pheasant, MD  ELIQUIS 5 MG TABS tablet Take 5 mg by mouth 2 (two) times daily.  05/23/16  Yes [provider]  FIBER PO Take 2 capsules by mouth daily.   Yes [provider]  fluticasone (FLONASE) 50 MCG/ACT nasal spray Place 2 sprays into both nostrils daily. 12/06/16  Yes Einar Pheasant, MD  fluticasone furoate-vilanterol (BREO ELLIPTA) 100-25 MCG/INH AEPB Inhale 1 puff into the lungs daily. 03/14/17  Yes Wilhelmina [name], MD  furosemide (LASIX) 20 MG tablet Take 1 tablet (20 mg total) by mouth daily as needed. 04/08/17  Yes Einar Pheasant, MD  gabapentin (NEURONTIN) 100 MG capsule Take two each morning, two each afternoon and three at bedtime Patient taking differently: 3 (three) times daily as needed. Take two each morning, two each afternoon and three at bedtime as needed for foot pain 09/10/16  Yes Einar Pheasant, MD  isosorbide mononitrate (IMDUR) 30 MG 24 hr tablet Take 30 mg by mouth daily.  07/21/15 03/03/18 Yes [provider]  losartan (COZAAR) 100 MG tablet Take 1 tablet (100 mg total) by mouth daily. 04/08/17  Yes Einar Pheasant, MD  metoprolol tartrate (LOPRESSOR) 100 MG tablet Take 50 mg by mouth 2 (two) times daily.    Yes [provider]  Multiple Vitamin (MULTIVITAMIN WITH MINERALS) TABS tablet Take 1 tablet by mouth daily.   Yes [provider]  mupirocin ointment (BACTROBAN) 2 % Place 1 application into the nose 2 (two) times daily. 01/17/16  Yes Einar Pheasant, MD  pantoprazole (PROTONIX) 40 MG tablet Take 1 tablet (40 mg total) by mouth 2 (two) times daily. 02/24/17  Yes Einar Pheasant, MD  sertraline (ZOLOFT) 50 MG  tablet Take 1 tablet (50 mg total) by mouth daily. 03/19/17  Yes Einar Pheasant, MD  vitamin B-12 (CYANOCOBALAMIN) 1000 MCG tablet Take 1,000 mcg by mouth daily.    Yes [provider]    Family History  Problem Relation Age of Onset  . Stroke Mother   . Heart disease Father        MI - 62   . Colon cancer Neg Hx   . Prostate cancer Neg Hx      Social History  Substance Use Topics  . Smoking status: Former Smoker    Quit date: 11/01/2015  . Smokeless tobacco: Never Used     Comment: about 2 cigarettes per day, trying to quit  . Alcohol use 25.2 oz/week    42 Cans of beer per week     Comment: occas    Allergies as of 04/09/2017 - Review Complete 04/09/2017  Allergen Reaction Noted  . Ivp dye [iodinated diagnostic agents]  09/08/2012    Review of Systems:    All  systems reviewed and negative except where noted in HPI.   Physical Exam:  BP (!) 155/86   Pulse (!) 57   Temp 97.6 F (36.4 C) (Oral)   Ht 5\' 9"  (1.753 m)   Wt 83.3 kg (183 lb 9.6 oz)   BMI 27.11 kg/m  No LMP for male patient.  General:   Alert,  Well-developed, well-nourished, pleasant and cooperative in NAD Head:  Normocephalic and atraumatic. Eyes:  Sclera clear, no icterus.   Conjunctiva pink. Ears:  Normal auditory acuity. Nose:  No deformity, discharge, or lesions. Mouth:  No deformity or lesions,oropharynx pink & moist. Neck:  Supple; no masses or thyromegaly. Lungs:  Respirations even and unlabored.  Clear throughout to auscultation.   No wheezes, crackles, or rhonchi. No acute distress. Heart:  Regular rate and rhythm; no murmurs, clicks, rubs, or gallops. Abdomen:  Normal bowel sounds.  No bruits.  Soft, non-tender and non-distended without masses, hepatosplenomegaly or hernias noted.  No guarding or rebound tenderness.   Rectal: Nor performed Msk:  Symmetrical without gross deformities. Good, equal movement & strength bilaterally. Pulses:  Normal pulses noted. Extremities:  No clubbing  or edema.  No cyanosis. Neurologic:  Alert and oriented x3;  grossly normal neurologically. Skin:  Intact without significant lesions or rashes. No jaundice. Lymph Nodes:  No significant cervical adenopathy. Psych:  Alert and cooperative. Normal mood and affect.  Imaging Studies:   Assessment and Plan:   GERIK COBERLY is a 76 y.o. y/o male with Collagenous colitis, Entocort as needed with one day of nausea, mild abdominal discomfort, loose stools without blood. I suspect this might be secondary to the food intake from [employer] yesterday. I will do stool studies including C. difficile if his diarrhea persists and I have asked him to contact my office if diarrhea gets worse. I will decrease his Protonix to once a day. Suggested him to take MiraLAX during episodes of constipation. He preferred to have CBC, iron studies, ferritin to be done at his PCPs office in September.  Follow up in 3 months  Troy Darby, MD

## 2017-04-14 LAB — SURGICAL PATHOLOGY

## 2017-04-17 ENCOUNTER — Other Ambulatory Visit: Payer: Self-pay | Admitting: Gastroenterology

## 2017-04-17 DIAGNOSIS — I712 Thoracic aortic aneurysm, without rupture: Secondary | ICD-10-CM | POA: Diagnosis not present

## 2017-04-17 DIAGNOSIS — R112 Nausea with vomiting, unspecified: Secondary | ICD-10-CM | POA: Diagnosis not present

## 2017-04-17 DIAGNOSIS — R197 Diarrhea, unspecified: Secondary | ICD-10-CM | POA: Diagnosis not present

## 2017-04-17 DIAGNOSIS — G43A0 Cyclical vomiting, not intractable: Secondary | ICD-10-CM

## 2017-04-17 DIAGNOSIS — R160 Hepatomegaly, not elsewhere classified: Secondary | ICD-10-CM | POA: Diagnosis not present

## 2017-04-17 DIAGNOSIS — D509 Iron deficiency anemia, unspecified: Secondary | ICD-10-CM | POA: Diagnosis not present

## 2017-04-17 DIAGNOSIS — Z789 Other specified health status: Secondary | ICD-10-CM | POA: Diagnosis not present

## 2017-04-18 ENCOUNTER — Other Ambulatory Visit
Admission: RE | Admit: 2017-04-18 | Discharge: 2017-04-18 | Disposition: A | Discharge status: Home / Self Care | Payer: PPO | Source: Ambulatory Visit | Attending: Gastroenterology | Admitting: Gastroenterology

## 2017-04-18 DIAGNOSIS — R112 Nausea with vomiting, unspecified: Secondary | ICD-10-CM | POA: Insufficient documentation

## 2017-04-18 DIAGNOSIS — R160 Hepatomegaly, not elsewhere classified: Secondary | ICD-10-CM | POA: Insufficient documentation

## 2017-04-18 LAB — GASTROINTESTINAL PANEL BY PCR, STOOL (REPLACES STOOL CULTURE)
Adenovirus F40/41: NOT DETECTED
Astrovirus: NOT DETECTED
CAMPYLOBACTER SPECIES: NOT DETECTED
Cryptosporidium: NOT DETECTED
Cyclospora cayetanensis: NOT DETECTED
Entamoeba histolytica: NOT DETECTED
Enteroaggregative E coli (EAEC): NOT DETECTED
Enteropathogenic E coli (EPEC): NOT DETECTED
Enterotoxigenic E coli (ETEC): NOT DETECTED
Giardia lamblia: NOT DETECTED
NOROVIRUS GI/GII: NOT DETECTED
PLESIMONAS SHIGELLOIDES: NOT DETECTED
Rotavirus A: NOT DETECTED
SALMONELLA SPECIES: NOT DETECTED
SAPOVIRUS (I, II, IV, AND V): NOT DETECTED
SHIGELLA/ENTEROINVASIVE E COLI (EIEC): NOT DETECTED
Shiga like toxin producing E coli (STEC): NOT DETECTED
Vibrio cholerae: NOT DETECTED
Vibrio species: NOT DETECTED
Yersinia enterocolitica: NOT DETECTED

## 2017-04-18 LAB — C DIFFICILE QUICK SCREEN W PCR REFLEX
C DIFFICILE (CDIFF) TOXIN: NEGATIVE
C DIFFICLE (CDIFF) ANTIGEN: NEGATIVE
C Diff interpretation: NOT DETECTED

## 2017-04-24 ENCOUNTER — Ambulatory Visit
Admission: RE | Admit: 2017-04-24 | Discharge: 2017-04-24 | Disposition: A | Discharge status: Home / Self Care | Payer: PPO | Source: Ambulatory Visit | Attending: Gastroenterology | Admitting: Gastroenterology

## 2017-04-24 DIAGNOSIS — R160 Hepatomegaly, not elsewhere classified: Secondary | ICD-10-CM | POA: Diagnosis not present

## 2017-04-24 DIAGNOSIS — R197 Diarrhea, unspecified: Secondary | ICD-10-CM | POA: Diagnosis not present

## 2017-04-24 DIAGNOSIS — R112 Nausea with vomiting, unspecified: Secondary | ICD-10-CM | POA: Diagnosis not present

## 2017-04-24 DIAGNOSIS — R110 Nausea: Secondary | ICD-10-CM | POA: Diagnosis not present

## 2017-04-24 DIAGNOSIS — G43A0 Cyclical vomiting, not intractable: Secondary | ICD-10-CM

## 2017-04-24 DIAGNOSIS — Z9049 Acquired absence of other specified parts of digestive tract: Secondary | ICD-10-CM | POA: Insufficient documentation

## 2017-04-29 ENCOUNTER — Encounter (INDEPENDENT_AMBULATORY_CARE_PROVIDER_SITE_OTHER): Payer: Self-pay | Admitting: Vascular Surgery

## 2017-04-29 ENCOUNTER — Ambulatory Visit (INDEPENDENT_AMBULATORY_CARE_PROVIDER_SITE_OTHER): Payer: PPO | Admitting: Vascular Surgery

## 2017-04-29 VITALS — BP 162/89 | HR 67 | Resp 17 | Ht 71.0 in | Wt 183.0 lb

## 2017-04-29 DIAGNOSIS — I10 Essential (primary) hypertension: Secondary | ICD-10-CM | POA: Diagnosis not present

## 2017-04-29 DIAGNOSIS — I712 Thoracic aortic aneurysm, without rupture, unspecified: Secondary | ICD-10-CM | POA: Insufficient documentation

## 2017-04-29 DIAGNOSIS — J449 Chronic obstructive pulmonary disease, unspecified: Secondary | ICD-10-CM

## 2017-04-29 NOTE — Progress Notes (Signed)
Patient ID: Troy Lopez, male   DOB: 22-Feb-1941, 76 y.o.   MRN: [id number]  Chief Complaint  Patient presents with  . New Patient (Initial Visit)    Thorasic aneurysm    HPI Troy Lopez is a 76 y.o. male.  I am asked to see the patient by C. London NP for evaluation of thoracic aortic aneurysm.  The patient reports work up recently which included a CT scan of the chest and an US of the abdomen and pelvis which I have reviewed.  He was having shortness of breath and cough as well as some abdominal pain. He denies any chest pain or pain between his shoulder blades. He denies any signs of peripheral embolization. He has no fevers or chills. 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 He is referred for further evaluation and treatment.   Past Medical History:  Diagnosis Date  . Adrenal gland anomaly    enlargement  . CAD (coronary artery disease)   . Carpal tunnel syndrome   . Colonic polyp   . COPD (chronic obstructive pulmonary disease) ([place])   . Degenerative disc disease, lumbar   . Depression   . Diverticulosis   . Dyspnea   . GERD (gastroesophageal reflux disease)   . Hypercholesterolemia   . Hyperkalemia   . Hypertension   . Irritable bowel syndrome   . Monoclonal gammopathy   . Neuropathy   . Personal history of tobacco use, presenting hazards to health 08/17/2015  . Sleep apnea     Past Surgical History:  Procedure Laterality Date  . BUNIONECTOMY  1989  . CARPAL TUNNEL RELEASE Left 2011   ulnar nerve sub muscular at elbow  . CHOLECYSTECTOMY  09/07  . COLONOSCOPY WITH PROPOFOL N/A 03/05/2017   Procedure: COLONOSCOPY WITH PROPOFOL;  Surgeon: Lucilla Lame, MD;  Location: [facility] ENDOSCOPY;  Service: Endoscopy;  Laterality: N/A;  . ELECTROPHYSIOLOGIC STUDY N/A 11/15/2015   Procedure: CARDIOVERSION;  Surgeon: Isaias Cowman, MD;  Location: [facility] ORS;  Service: Cardiovascular;   Laterality: N/A;  . ESOPHAGOGASTRODUODENOSCOPY (EGD) WITH PROPOFOL N/A 03/05/2017   Procedure: ESOPHAGOGASTRODUODENOSCOPY (EGD) WITH PROPOFOL;  Surgeon: Lucilla Lame, MD;  Location: [facility] ENDOSCOPY;  Service: Endoscopy;  Laterality: N/A;  . EYE SURGERY Bilateral 2010   cataract  . HEMORRHOID SURGERY    . KYPHOPLASTY N/A 11/04/2016   Procedure: KYPHOPLASTY T 12;  Surgeon: Hessie Knows, MD;  Location: [facility] ORS;  Service: Orthopedics;  Laterality: N/A;    Family History  Problem Relation Age of Onset  . Stroke Mother   . Heart disease Father        MI - 58   . Colon cancer Neg Hx   . Prostate cancer Neg Hx   NO aneurysms  Social History Social History  Substance Use Topics  . Smoking status: Former Smoker    Quit date: 11/01/2015  . Smokeless tobacco: Never Used     Comment: about 2 cigarettes per day, trying to quit  . Alcohol use 25.2 oz/week    42 Cans of beer per week     Comment: occas  NO IVDU  Allergies  Allergen Reactions  . 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 acetaminophen (TYLENOL) 500 MG tablet Take 500-1,000 mg by mouth 3 (three) times daily as needed for  moderate pain or headache.    . albuterol (PROVENTIL HFA;VENTOLIN HFA) 108 (90 Base) MCG/ACT inhaler Inhale 2 puffs into the lungs every 6 (six) hours as needed for wheezing or shortness of breath.    Marland Kitchen alendronate (FOSAMAX) 70 MG tablet Take 70 mg by mouth once a week. Take with a full glass of water on an empty stomach. Takes on Sundays    . amLODipine (NORVASC) 10 MG tablet Take 1 tablet (5 mg total) by mouth daily. 90 tablet 0  . budesonide (ENTOCORT EC) 3 MG 24 hr capsule Take 9 mg by mouth daily as needed (colitis flare).     . busPIRone (BUSPAR) 10 MG tablet Take 1 tablet (10 mg total) by mouth every evening. 30 tablet 1  . ELIQUIS 5 MG TABS tablet Take 5 mg by mouth  2 (two) times daily.     Marland Kitchen FIBER PO Take 2 capsules by mouth daily.    . fluticasone (FLONASE) 50 MCG/ACT nasal spray Place 2 sprays into both nostrils daily. 16 g 0  . fluticasone furoate-vilanterol (BREO ELLIPTA) 100-25 MCG/INH AEPB Inhale 1 puff into the lungs daily. 180 each 4  . furosemide (LASIX) 20 MG tablet Take 1 tablet (20 mg total) by mouth daily as needed. 30 tablet 0  . gabapentin (NEURONTIN) 100 MG capsule Take two each morning, two each afternoon and three at bedtime (Patient taking differently: 3 (three) times daily as needed. Take two each morning, two each afternoon and three at bedtime as needed for foot pain) 210 capsule 0  . isosorbide mononitrate (IMDUR) 30 MG 24 hr tablet Take 30 mg by mouth daily.     Marland Kitchen losartan (COZAAR) 100 MG tablet Take 1 tablet (100 mg total) by mouth daily. 90 tablet 0  . metoprolol tartrate (LOPRESSOR) 100 MG tablet Take 50 mg by mouth 2 (two) times daily.     . Multiple Vitamin (MULTIVITAMIN WITH MINERALS) TABS tablet Take 1 tablet by mouth daily.    . pantoprazole (PROTONIX) 40 MG tablet Take 1 tablet (40 mg total) by mouth 2 (two) times daily. 180 tablet 0  . sertraline (ZOLOFT) 50 MG tablet Take 1 tablet (50 mg total) by mouth daily. 30 tablet 0  . vitamin B-12 (CYANOCOBALAMIN) 1000 MCG tablet Take 1,000 mcg by mouth daily.     . mupirocin ointment (BACTROBAN) 2 % Place 1 application into the nose 2 (two) times daily. (Patient not taking: Reported on 04/29/2017) 22 g 0   No current facility-administered medications for this visit.       REVIEW OF SYSTEMS (Negative unless checked)  Constitutional: []Weight loss  []Fever  []Chills Cardiac: []Chest pain   []Chest pressure   []Palpitations   []Shortness of breath when laying flat   []Shortness of breath at rest   [x]Shortness of breath with exertion. Vascular:  []Pain in legs with walking   []Pain in legs at rest   []Pain in legs when laying flat   []Claudication   []Pain in feet when walking   []Pain in feet at rest  []Pain in feet when laying flat   []History of DVT   []Phlebitis   []Swelling in legs   []Varicose veins   []Non-healing ulcers Pulmonary:   []Uses home oxygen   [x]Productive cough   []Hemoptysis   []Wheeze  [x]COPD   []Asthma Neurologic:  []Dizziness  []Blackouts   []Seizures   []History of stroke   []History of TIA  []Aphasia   []Temporary blindness   []  Dysphagia   []Weakness or numbness in arms   []Weakness or numbness in legs Musculoskeletal:  [x]Arthritis   []Joint swelling   []Joint pain   []Low back pain Hematologic:  []Easy bruising  []Easy bleeding   []Hypercoagulable state   []Anemic  []Hepatitis Gastrointestinal:  []Blood in stool   []Vomiting blood  []Gastroesophageal reflux/heartburn   [x]Abdominal pain Genitourinary:  []Chronic kidney disease   []Difficult urination  []Frequent urination  []Burning with urination   []Hematuria Skin:  []Rashes   []Ulcers   []Wounds Psychological:  []History of anxiety   [] History of major depression.    Physical Exam BP (!) 162/89   Pulse 67   Resp 17   Ht 5' 11" (1.803 m)   Wt 183 lb (83 kg)   BMI 25.52 kg/m  Gen:  WD/WN, NAD. Appears younger than stated age. Head: [place]/AT, No temporalis wasting.  Ear/Nose/Throat: Hearing grossly intact, nares w/o erythema or drainage, oropharynx w/o Erythema/Exudate Eyes: Conjunctiva clear, sclera non-icteric  Neck: trachea midline.  No bruit or JVD.  Pulmonary:  Good air movement, clear to auscultation bilaterally.  Cardiac: RRR, normal S1, S2 Vascular:  Vessel Right Left  Radial Palpable Palpable  Ulnar Palpable Palpable  Brachial Palpable Palpable  Carotid Palpable, without bruit Palpable, without bruit  Aorta Not palpable N/A  Femoral Palpable Palpable  Popliteal Palpable Palpable  PT Palpable 1+ Palpable  DP Palpable Palpable   Gastrointestinal: soft, non-tender/non-distended.  Musculoskeletal: M/S 5/5 throughout.  Extremities without ischemic changes.  No deformity  or atrophy.  Neurologic: Sensation grossly intact in extremities.  Symmetrical.  Speech is fluent. Motor exam as listed above. Psychiatric: Judgment intact, Mood & affect appropriate for pt's clinical situation. Dermatologic: No rashes or ulcers noted.  No cellulitis or open wounds. Lymph : No Cervical, Axillary, or Inguinal lymphadenopathy.   Radiology US Abdomen Complete  Result Date: 04/24/2017 CLINICAL DATA:  Nausea, diarrhea. EXAM: ABDOMEN ULTRASOUND COMPLETE COMPARISON:  CT scan of April 30, 2013. FINDINGS: Gallbladder: Status post cholecystectomy. Common bile duct: Diameter: 2.4 mm which is within normal limits. Liver: No focal lesion identified. Within normal limits in parenchymal echogenicity. Portal vein is patent on color Doppler imaging with normal direction of blood flow towards the liver. IVC: No abnormality visualized. Pancreas: Visualized portion unremarkable. Spleen: Size and appearance within normal limits. Right Kidney: Length: 10.3 cm. Echogenicity within normal limits. No mass or hydronephrosis visualized. Left Kidney: Length: 11.8 cm. Echogenicity within normal limits. No mass or hydronephrosis visualized. Abdominal aorta: No aneurysm visualized. Other findings: None. IMPRESSION: Status post cholecystectomy. No other abnormality seen in the abdomen. Electronically Signed   By: Marijo Conception, M.D.   On: 04/24/2017 09:12    Labs Recent Results (from the past 2160 hour(s))  Sodium     Status: Abnormal   Collection Time: 01/30/17 10:14 AM  Result Value Ref Range   Sodium 124 (L) 135 - 145 mEq/L  Potassium     Status: None   Collection Time: 01/30/17 10:14 AM  Result Value Ref Range   Potassium 4.8 3.5 - 5.1 mEq/L  Basic metabolic panel     Status: Abnormal   Collection Time: 03/03/17  7:17 AM  Result Value Ref Range   Sodium 134 (L) 135 - 145 mmol/L   Potassium 4.3 3.5 - 5.1 mmol/L   Chloride 101 101 - 111 mmol/L   CO2 23 22 - 32 mmol/L   Glucose, Bld 100 (H) 65 -  99 mg/dL   BUN 14  6 - 20 mg/dL   Creatinine, Ser 0.94 0.61 - 1.24 mg/dL   Calcium 9.3 8.9 - 10.3 mg/dL   GFR calc non Af Amer >60 >60 mL/min   GFR calc Af Amer >60 >60 mL/min    Comment: (NOTE) The eGFR has been calculated using the CKD EPI equation. This calculation has not been validated in all clinical situations. eGFR's persistently <60 mL/min signify possible Chronic Kidney Disease.    Anion gap 10 5 - 15  CBC     Status: Abnormal   Collection Time: 03/03/17  7:17 AM  Result Value Ref Range   WBC 10.4 3.8 - 10.6 K/uL   RBC 3.00 (L) 4.40 - 5.90 MIL/uL   Hemoglobin 9.8 (L) 13.0 - 18.0 g/dL   HCT 28.5 (L) 40.0 - 52.0 %   MCV 95.2 80.0 - 100.0 fL   MCH 32.7 26.0 - 34.0 pg   MCHC 34.3 32.0 - 36.0 g/dL   RDW 15.0 (H) 11.5 - 14.5 %   Platelets 431 150 - 440 K/uL  Troponin I     Status: None   Collection Time: 03/03/17  7:17 AM  Result Value Ref Range   Troponin I <0.03 <0.03 ng/mL  Troponin I     Status: None   Collection Time: 03/03/17 10:17 AM  Result Value Ref Range   Troponin I <0.03 <0.03 ng/mL  Troponin I     Status: None   Collection Time: 03/03/17  1:08 PM  Result Value Ref Range   Troponin I <0.03 <0.03 ng/mL  Troponin I     Status: None   Collection Time: 03/03/17  7:33 PM  Result Value Ref Range   Troponin I <0.03 <0.03 ng/mL  Troponin I     Status: None   Collection Time: 03/04/17 12:16 AM  Result Value Ref Range   Troponin I <0.03 <0.03 ng/mL  CBC     Status: Abnormal   Collection Time: 03/04/17 12:16 AM  Result Value Ref Range   WBC 8.5 3.8 - 10.6 K/uL   RBC 2.64 (L) 4.40 - 5.90 MIL/uL   Hemoglobin 8.8 (L) 13.0 - 18.0 g/dL   HCT 25.3 (L) 40.0 - 52.0 %   MCV 95.9 80.0 - 100.0 fL   MCH 33.3 26.0 - 34.0 pg   MCHC 34.7 32.0 - 36.0 g/dL   RDW 15.3 (H) 11.5 - 14.5 %   Platelets 385 150 - 440 K/uL  NM Myocar Multi W/Spect W/Wall Motion / EF     Status: None   Collection Time: 03/04/17 11:42 AM  Result Value Ref Range   Rest HR 90 bpm   Rest BP 137/68  mmHg   Exercise duration (sec) 0 sec   Percent HR 69 %   Exercise duration (min) 1 min   Estimated workload 1.0 METS   Peak HR 102 bpm   Peak BP 150/59 mmHg   MPHR 146 bpm   SSS 4    SRS 16    SDS 0    TID 0.93    LV sys vol 17 mL   LV dias vol 68 62 - 150 mL  Hemoglobin     Status: Abnormal   Collection Time: 03/05/17  6:07 AM  Result Value Ref Range   Hemoglobin 9.2 (L) 13.0 - 18.0 g/dL  Surgical pathology     Status: None   Collection Time: 03/05/17 12:06 PM  Result Value Ref Range   SURGICAL PATHOLOGY      Surgical  Pathology CASE: [id number] PATIENT: Adil Kriz Surgical Pathology Report     SPECIMEN SUBMITTED: A. Rectum polyp; cold snare  CLINICAL HISTORY: None provided  PRE-OPERATIVE DIAGNOSIS: HEME positive stools  POST-OPERATIVE DIAGNOSIS: Hiatal hernia, diverticulosis     DIAGNOSIS: A.  RECTUM POLYP; COLD SNARE: - FOCAL HEMORRHAGE. - NEGATIVE FOR DYSPLASIA AND MALIGNANCY. - MULTIPLE DEEPER LEVELS WERE EXAMINED.   GROSS DESCRIPTION:  A. Labeled: cold snare polyp rectum  Tissue fragment(s): 1  Size: 0.5 cm  Description: pink to tan fragment  Entirely submitted in one cassette(s).    Final Diagnosis performed by Delorse Lek, MD.  Electronically signed 03/10/2017 4:19:59PM    The electronic signature indicates that the named Attending Pathologist has evaluated the specimen  Technical component performed at [facility], [address] [place], [place] [address] Lab: [phone] Dir: Darrick Penna. Evette Doffing, MD  Professional component perform ed at [facility], [facility], [place], [place], [place] [address] Lab: [phone] Dir: Dellia Nims. Rubinas, MD    Kappa/lambda light chains     Status: Abnormal   Collection Time: 03/13/17  9:50 AM  Result Value Ref Range   Kappa free light chain 81.4 (H) 3.3 - 19.4 mg/L   Lamda free light chains 13.2 5.7 - 26.3 mg/L   Kappa, lamda light chain ratio 6.17 (H) 0.26 - 1.65     Comment: (NOTE) Performed At: [facility] [place], [place] [id number] Lindon Romp MD AC:[id number]   Protein electrophoresis, serum     Status: Abnormal   Collection Time: 03/13/17  9:50 AM  Result Value Ref Range   Total Protein ELP 7.9 6.0 - 8.5 g/dL   Albumin ELP 3.4 2.9 - 4.4 g/dL   Alpha-1-Globulin 0.3 0.0 - 0.4 g/dL   Alpha-2-Globulin 0.6 0.4 - 1.0 g/dL   Beta Globulin 1.2 0.7 - 1.3 g/dL   Gamma Globulin 2.5 (H) 0.4 - 1.8 g/dL   M-Spike, % 1.1 (H) Not Observed g/dL    Comment: AN ADDITIONAL M SPIKE WAS OBSERVED AT A CONCENTRATION OF 0.7 G/DL.    SPE Interp. Comment     Comment: (NOTE) The SPE pattern demonstrates two peaks in the beta-gamma region which may represent monoclonal protein. A biclonal gammopathy may be confirmed by immunofixation, as well as evaluation of the urine for the presence of Bence-Jones protein. Performed At: [facility] [place], [place] [id number] Lindon Romp MD DU:[id number]    Comment Comment     Comment: (NOTE) Protein electrophoresis scan will follow via computer, mail, or courier delivery.    GLOBULIN, TOTAL 4.5 (H) 2.2 - 3.9 g/dL   A/G Ratio 0.8 0.7 - 1.7  IgG, IgA, IgM     Status: Abnormal   Collection Time: 03/13/17  9:50 AM  Result Value Ref Range   IgG (Immunoglobin G), Serum 463 (L) 700 - 1,600 mg/dL   IgA 55 (L) 61 - 437 mg/dL   IgM, Serum 2,931 (H) 15 - 143 mg/dL    Comment: (NOTE) Results confirmed on dilution. Performed At: [facility] [place], [place] [id number] Lindon Romp MD VO:[id number]   CBC with Differential/Platelet     Status: Abnormal   Collection Time: 03/13/17  9:50 AM  Result Value Ref Range   WBC 8.1 3.8 - 10.6 K/uL   RBC 2.93 (L) 4.40 - 5.90 MIL/uL   Hemoglobin 9.6 (L) 13.0 - 18.0 g/dL   HCT 28.1 (L) 40.0 - 52.0 %   MCV  95.9 80.0 - 100.0 fL   MCH 32.7 26.0 - 34.0 pg   MCHC 34.1 32.0 - 36.0 g/dL   RDW 15.5 (H) 11.5  - 14.5 %   Platelets 585 (H) 150 - 440 K/uL   Neutrophils Relative % 68 %   Neutro Abs 5.6 1.4 - 6.5 K/uL   Lymphocytes Relative 20 %   Lymphs Abs 1.6 1.0 - 3.6 K/uL   Monocytes Relative 10 %   Monocytes Absolute 0.8 0.2 - 1.0 K/uL   Eosinophils Relative 1 %   Eosinophils Absolute 0.0 0 - 0.7 K/uL   Basophils Relative 1 %   Basophils Absolute 0.1 0 - 0.1 K/uL  Ferritin     Status: Abnormal   Collection Time: 03/13/17  9:50 AM  Result Value Ref Range   Ferritin 17 (L) 24 - 336 ng/mL  Iron and TIBC     Status: Abnormal   Collection Time: 03/13/17  9:50 AM  Result Value Ref Range   Iron 51 45 - 182 ug/dL   TIBC 1,084 (H) 250 - 450 ug/dL   Saturation Ratios 5 (L) 17.9 - 39.5 %   UIBC 1,033 ug/dL  Gastrointestinal Panel by PCR , Stool     Status: None   Collection Time: 04/18/17  9:00 AM  Result Value Ref Range   Campylobacter species NOT DETECTED NOT DETECTED   Plesimonas shigelloides NOT DETECTED NOT DETECTED   Salmonella species NOT DETECTED NOT DETECTED   Yersinia enterocolitica NOT DETECTED NOT DETECTED   Vibrio species NOT DETECTED NOT DETECTED   Vibrio cholerae NOT DETECTED NOT DETECTED   Enteroaggregative E coli (EAEC) NOT DETECTED NOT DETECTED   Enteropathogenic E coli (EPEC) NOT DETECTED NOT DETECTED   Enterotoxigenic E coli (ETEC) NOT DETECTED NOT DETECTED   Shiga like toxin producing E coli (STEC) NOT DETECTED NOT DETECTED   Shigella/Enteroinvasive E coli (EIEC) NOT DETECTED NOT DETECTED   Cryptosporidium NOT DETECTED NOT DETECTED   Cyclospora cayetanensis NOT DETECTED NOT DETECTED   Entamoeba histolytica NOT DETECTED NOT DETECTED   Giardia lamblia NOT DETECTED NOT DETECTED   Adenovirus F40/41 NOT DETECTED NOT DETECTED   Astrovirus NOT DETECTED NOT DETECTED   Norovirus GI/GII NOT DETECTED NOT DETECTED   Rotavirus A NOT DETECTED NOT DETECTED   Sapovirus (I, II, IV, and V) NOT DETECTED NOT DETECTED  C difficile quick scan w PCR reflex     Status: None   Collection  Time: 04/18/17  9:00 AM  Result Value Ref Range   C Diff antigen NEGATIVE NEGATIVE   C Diff toxin NEGATIVE NEGATIVE   C Diff interpretation No C. difficile detected.     Assessment/Plan:  Hypertension blood pressure control important in reducing the progression of atherosclerotic disease and aneurysm disease. On appropriate oral medications.   Moderate COPD (chronic obstructive pulmonary disease) (HCC) On inhalers and appropriate medicines  Thoracic aortic aneurysm [facility]) The patient has a 4.0 cm ascending thoracic aortic aneurysm. We had a long discussion today about the natural history and pathophysiology of aneurysms. This is well below the threshold for prophylactic repair. We discussed factors to try to avoid growth  including continued abstinence from tobacco and blood pressure control. we have discussed that the risk at this size is essentially 0, but this will need to be monitored on roughly an annual basis at this point. I will plan to order a CT scan of the chest and see him back in 1 year.       Corene Cornea  Dew 04/29/2017, 4:24 PM   This note was created with Dragon medical transcription system.  Any errors from dictation are unintentional.

## 2017-04-29 NOTE — Patient Instructions (Signed)
Thoracic Aortic Aneurysm An aneurysm is a bulge in an artery. It happens when blood pushes up against a weakened or damaged artery wall. A thoracic aortic aneurysm is an aneurysm that occurs in the first part of the aorta, between the heart and the diaphragm. The aorta is the main artery of the body. It supplies blood from the heart to the rest of the body. Some aneurysms may not cause symptoms or problems. However, the major concern with a thoracic aortic aneurysm is that it can enlarge and burst (rupture), or blood can flow between the layers of the wall of the aorta through a tear (aorticdissection). Both of these conditions can cause bleeding inside the body and can be life-threatening if they are not diagnosed and treated right away. What are the causes? The exact cause of this condition is not known. What increases the risk? The following factors may make you more likely to develop this condition:  Being age 65 or older.  Having a hardening of the arteries caused by the buildup of fat and other substances in the lining of a blood vessel (arteriosclerosis).  Having inflammation of the walls of an artery (arteritis).  Having a genetic disease that weakens the body's connective tissue, such as Marfan syndrome.  Having an injury or trauma to the aorta.  Having an infection that is caused by bacteria, such as syphilis or staphylococcus, in the wall of the aorta (infectious aortitis).  Having high blood pressure (hypertension).  Being male.  Being white (Caucasian).  Having high cholesterol.  Having a family history of aneurysms.  Using tobacco.  Having chronic obstructive pulmonary disease (COPD).  What are the signs or symptoms? Symptoms of this condition vary depending on the size and rate of growth of the aneurysm. Most grow slowly and do not cause any symptoms. When symptoms do occur, they may include:  Pain in the chest, back, sides, or abdomen. The pain may vary in  intensity. A sudden onset of severe pain may indicate that the aneurysm has ruptured.  Hoarseness.  Cough.  Shortness of breath.  Swallowing problems.  Swelling in the face, arms, or legs.  Fever.  Unexplained weight loss.  How is this diagnosed? This condition may be diagnosed with:  An ultrasound.  X-rays.  A CT scan.  An MRI.  Tests to check the arteries for damage or blockages (angiogram).  Most unruptured thoracic aortic aneurysms cause no symptoms, so they are often found during exams for other conditions. How is this treated? Treatment for this condition depends on:  The size of the aneurysm.  How fast the aneurysm is growing.  Your age.  Risk factors for rupture.  Aneurysms that are smaller than 2.2 inches (5.5 cm) may be managed by using medicines to control blood pressure, manage pain, or fight infection. You may need regular monitoring to see if the aneurysm is getting bigger. Your health care provider may recommend that you have an ultrasound every year or every 6 months. How often you need to have an ultrasound depends on the size of the aneurysm, how fast it is growing, and whether you have a family history of aneurysms. Surgical repair may be needed if your aneurysm is larger than 2.2 inches or if it is growing quickly. Follow these instructions at home: Eating and drinking  Eat a healthy diet. Your health care provider may recommend that you: ? Lower your salt (sodium) intake. In some people, too much salt can raise blood pressure and increase   the risk of thoracic aortic aneurysm. ? Avoid foods that are high in saturated fat and cholesterol, such as red meat and dairy. ? Eat a diet that is low in sugar. ? Increase your fiber intake by including whole grains, vegetables, and fruits in your diet. Eating these foods may help to lower blood pressure.  Limit or avoid alcohol as recommended by your health care provider. Lifestyle  Follow instructions  from your health care provider about healthy lifestyle habits. Your health care provider may recommend that you: ? Do not use any products that contain nicotine or tobacco, such as cigarettes and e-cigarettes. If you need help quitting, ask your health care provider. ? Keep your blood pressure within normal limits. The target limit for most people is below 120/80. Check your blood pressure regularly. If it is high, ask your health care provider about ways that you can control it. ? Keep your blood sugar (glucose) level and cholesterol levels within normal limits. Target limits for most people are:  Blood glucose level: Less than 100 mg/dL.  Total cholesterol level: Less than 200 mg/dL. ? Maintain a healthy weight. Activity  Stay physically active and exercise regularly. Talk with your health care provider about how often you should exercise and ask which types of exercise are safe for you.  Avoid heavy lifting and activities that take a lot of effort (are strenuous). Ask your health care provider what activities are safe for you. General instructions  Keep all follow-up visits as told by your health care provider. This is important. ? Talk with your health care provider about regular screenings to see if the aneurysm is getting bigger.  Take over-the-counter and prescription medicines only as told by your health care provider. Contact a health care provider if:  You have discomfort in your upper back, neck, or abdomen.  You have trouble swallowing.  You have a cough or hoarseness.  You have a family history of aneurysms.  You have unexplained weight loss. Get help right away if:  You have sudden, severe pain in your upper back and abdomen. This pain may move into your chest and arms.  You have shortness of breath.  You have a fever. This information is not intended to replace advice given to you by your health care provider. Make sure you discuss any questions you have with your  health care provider. Document Released: 08/05/2005 Document Revised: 05/17/2016 Document Reviewed: 05/17/2016 Elsevier Interactive Patient Education  2018 [employer]  

## 2017-04-29 NOTE — Assessment & Plan Note (Signed)
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 We had a long discussion today about the natural history and pathophysiology of aneurysms. This is well below the threshold for prophylactic repair. We discussed factors to try to avoid growth  including continued abstinence from tobacco and blood pressure control. we have discussed that the risk at this size is essentially 0, but this will need to be monitored on roughly an annual basis at this point. I will plan to order a CT scan of the chest and see him back in 1 year.

## 2017-04-29 NOTE — Assessment & Plan Note (Signed)
blood pressure control important in reducing the progression of atherosclerotic disease and aneurysm disease. On appropriate oral medications.

## 2017-04-29 NOTE — Assessment & Plan Note (Signed)
On inhalers and appropriate medicines

## 2017-04-30 ENCOUNTER — Ambulatory Visit (INDEPENDENT_AMBULATORY_CARE_PROVIDER_SITE_OTHER): Payer: PPO | Admitting: Internal Medicine

## 2017-04-30 ENCOUNTER — Encounter: Payer: Self-pay | Admitting: Internal Medicine

## 2017-04-30 VITALS — BP 138/74 | HR 65 | Temp 98.6°F | Resp 18 | Ht 69.0 in | Wt 181.8 lb

## 2017-04-30 DIAGNOSIS — J449 Chronic obstructive pulmonary disease, unspecified: Secondary | ICD-10-CM

## 2017-04-30 DIAGNOSIS — G4730 Sleep apnea, unspecified: Secondary | ICD-10-CM

## 2017-04-30 DIAGNOSIS — I10 Essential (primary) hypertension: Secondary | ICD-10-CM

## 2017-04-30 DIAGNOSIS — I4892 Unspecified atrial flutter: Secondary | ICD-10-CM

## 2017-04-30 DIAGNOSIS — I272 Pulmonary hypertension, unspecified: Secondary | ICD-10-CM | POA: Diagnosis not present

## 2017-04-30 DIAGNOSIS — G479 Sleep disorder, unspecified: Secondary | ICD-10-CM

## 2017-04-30 DIAGNOSIS — D472 Monoclonal gammopathy: Secondary | ICD-10-CM

## 2017-04-30 DIAGNOSIS — I2510 Atherosclerotic heart disease of native coronary artery without angina pectoris: Secondary | ICD-10-CM | POA: Diagnosis not present

## 2017-04-30 DIAGNOSIS — F32A Depression, unspecified: Secondary | ICD-10-CM

## 2017-04-30 DIAGNOSIS — I712 Thoracic aortic aneurysm, without rupture, unspecified: Secondary | ICD-10-CM

## 2017-04-30 DIAGNOSIS — E871 Hypo-osmolality and hyponatremia: Secondary | ICD-10-CM | POA: Diagnosis not present

## 2017-04-30 DIAGNOSIS — K52831 Collagenous colitis: Secondary | ICD-10-CM

## 2017-04-30 DIAGNOSIS — Z23 Encounter for immunization: Secondary | ICD-10-CM | POA: Diagnosis not present

## 2017-04-30 DIAGNOSIS — D509 Iron deficiency anemia, unspecified: Secondary | ICD-10-CM

## 2017-04-30 DIAGNOSIS — F329 Major depressive disorder, single episode, unspecified: Secondary | ICD-10-CM | POA: Diagnosis not present

## 2017-04-30 DIAGNOSIS — Z125 Encounter for screening for malignant neoplasm of prostate: Secondary | ICD-10-CM

## 2017-04-30 LAB — BASIC METABOLIC PANEL
BUN: 14 mg/dL (ref 6–23)
CHLORIDE: 99 meq/L (ref 96–112)
CO2: 25 meq/L (ref 19–32)
CREATININE: 1.06 mg/dL (ref 0.40–1.50)
Calcium: 10.2 mg/dL (ref 8.4–10.5)
GFR: 72.36 mL/min (ref >60.00)
GLUCOSE: 98 mg/dL (ref 70–99)
POTASSIUM: 5 meq/L (ref 3.5–5.1)
Sodium: 132 mEq/L — ABNORMAL LOW (ref 135–145)

## 2017-04-30 NOTE — Progress Notes (Signed)
Patient ID: Troy Lopez, male   DOB: [birth year], 76 y.o.   MRN: [id number]   Subjective:    Patient ID: Troy Lopez, male    DOB: 08/05/1941, 76 y.o.   MRN: [id number]  HPI  Patient here for a scheduled follow up.  He reports he feels better.  No nausea now.  Having some diarrhea.  Seeing GI.  Has gastric emptying study scheduled for 05/06/17.  Eating.  Has tried to decrease his alcohol intake.  Breathing stable.  No chest pain reported.  States blood pressure is averaging 130's/70-80s.  Followed by cardiology.  Negative lexiscan.  On eliquis.  S/p cardioversion.     Past Medical History:  Diagnosis Date  . Adrenal gland anomaly    enlargement  . CAD (coronary artery disease)   . Carpal tunnel syndrome   . Colonic polyp   . COPD (chronic obstructive pulmonary disease) ([place])   . Degenerative disc disease, lumbar   . Depression   . Diverticulosis   . Dyspnea   . GERD (gastroesophageal reflux disease)   . Hypercholesterolemia   . Hyperkalemia   . Hypertension   . Irritable bowel syndrome   . Monoclonal gammopathy   . Neuropathy   . Personal history of tobacco use, presenting hazards to health 08/17/2015  . Sleep apnea    Past Surgical History:  Procedure Laterality Date  . BUNIONECTOMY  1989  . CARPAL TUNNEL RELEASE Left 2011   ulnar nerve sub muscular at elbow  . CHOLECYSTECTOMY  09/07  . COLONOSCOPY WITH PROPOFOL N/A 03/05/2017   Procedure: COLONOSCOPY WITH PROPOFOL;  Surgeon: Lucilla Lame, MD;  Location: [facility] ENDOSCOPY;  Service: Endoscopy;  Laterality: N/A;  . ELECTROPHYSIOLOGIC STUDY N/A 11/15/2015   Procedure: CARDIOVERSION;  Surgeon: Isaias Cowman, MD;  Location: [facility] ORS;  Service: Cardiovascular;  Laterality: N/A;  . ESOPHAGOGASTRODUODENOSCOPY (EGD) WITH PROPOFOL N/A 03/05/2017   Procedure: ESOPHAGOGASTRODUODENOSCOPY (EGD) WITH PROPOFOL;  Surgeon: Lucilla Lame, MD;  Location: [facility] ENDOSCOPY;  Service: Endoscopy;  Laterality: N/A;  . EYE SURGERY Bilateral 2010    cataract  . HEMORRHOID SURGERY    . KYPHOPLASTY N/A 11/04/2016   Procedure: KYPHOPLASTY T 12;  Surgeon: Hessie Knows, MD;  Location: [facility] ORS;  Service: Orthopedics;  Laterality: N/A;   Family History  Problem Relation Age of Onset  . Stroke Mother   . Heart disease Father        MI - 58   . Colon cancer Neg Hx   . Prostate cancer Neg Hx    Social History   Social History  . Marital status: Married    Spouse name: N/A  . Number of children: 3  . Years of education: N/A   Social History Main Topics  . Smoking status: Former Smoker    Quit date: 11/01/2015  . Smokeless tobacco: Never Used     Comment: about 2 cigarettes per day, trying to quit  . Alcohol use 25.2 oz/week    42 Cans of beer per week     Comment: occas  . Drug use: No  . Sexual activity: Not Asked   Other Topics Concern  . None   Social History Narrative  . None    Outpatient Encounter Prescriptions as of 04/30/2017  Medication Sig  . acetaminophen (TYLENOL) 500 MG tablet Take 500-1,000 mg by mouth 3 (three) times daily as needed for moderate pain or headache.  . albuterol (PROVENTIL HFA;VENTOLIN HFA) 108 (90 Base) MCG/ACT inhaler Inhale 2 puffs into the lungs  every 6 (six) hours as needed for wheezing or shortness of breath.  Marland Kitchen alendronate (FOSAMAX) 70 MG tablet Take 70 mg by mouth once a week. Take with a full glass of water on an empty stomach. Takes on Sundays  . amLODipine (NORVASC) 10 MG tablet Take 1 tablet (5 mg total) by mouth daily.  . budesonide (ENTOCORT EC) 3 MG 24 hr capsule Take 9 mg by mouth daily as needed (colitis flare).   . busPIRone (BUSPAR) 10 MG tablet Take 1 tablet (10 mg total) by mouth every evening.  Marland Kitchen ELIQUIS 5 MG TABS tablet Take 5 mg by mouth 2 (two) times daily.   Marland Kitchen FIBER PO Take 2 capsules by mouth daily.  . fluticasone (FLONASE) 50 MCG/ACT nasal spray Place 2 sprays into both nostrils daily.  . fluticasone furoate-vilanterol (BREO ELLIPTA) 100-25 MCG/INH AEPB Inhale 1  puff into the lungs daily.  . furosemide (LASIX) 20 MG tablet Take 1 tablet (20 mg total) by mouth daily as needed.  . gabapentin (NEURONTIN) 100 MG capsule Take two each morning, two each afternoon and three at bedtime (Patient taking differently: 3 (three) times daily as needed. Take two each morning, two each afternoon and three at bedtime as needed for foot pain)  . isosorbide mononitrate (IMDUR) 30 MG 24 hr tablet Take 30 mg by mouth daily.   Marland Kitchen losartan (COZAAR) 100 MG tablet Take 1 tablet (100 mg total) by mouth daily.  . metoprolol tartrate (LOPRESSOR) 100 MG tablet Take 50 mg by mouth 2 (two) times daily.   . Multiple Vitamin (MULTIVITAMIN WITH MINERALS) TABS tablet Take 1 tablet by mouth daily.  . mupirocin ointment (BACTROBAN) 2 % Place 1 application into the nose 2 (two) times daily.  . pantoprazole (PROTONIX) 40 MG tablet Take 1 tablet (40 mg total) by mouth 2 (two) times daily.  . sertraline (ZOLOFT) 50 MG tablet Take 1 tablet (50 mg total) by mouth daily.  . vitamin B-12 (CYANOCOBALAMIN) 1000 MCG tablet Take 1,000 mcg by mouth daily.    No facility-administered encounter medications on file as of 04/30/2017.     Review of Systems  Constitutional: Negative for appetite change and unexpected weight change.  HENT: Negative for congestion and sinus pressure.   Respiratory: Negative for cough, chest tightness and shortness of breath.        Breathing stable.   Cardiovascular: Negative for chest pain, palpitations and leg swelling.  Gastrointestinal: Negative for vomiting.       Nausea is better.  Some diarrhea now.    Genitourinary: Negative for difficulty urinating and dysuria.  Musculoskeletal: Negative for joint swelling and myalgias.  Skin: Negative for color change and rash.  Neurological: Negative for headaches.       Occasional light headedness if he stands too fast.  Discussed slow position changes and movements.    Psychiatric/Behavioral: Negative for agitation and  dysphoric mood.       Objective:    Physical Exam  Constitutional: He appears well-developed and well-nourished. No distress.  HENT:  Nose: Nose normal.  Mouth/Throat: Oropharynx is clear and moist.  Neck: Neck supple.  Cardiovascular: Normal rate and regular rhythm.   Pulmonary/Chest: Effort normal and breath sounds normal. No respiratory distress.  Abdominal: Soft. Bowel sounds are normal. There is no tenderness.  Musculoskeletal: He exhibits no edema or tenderness.  Lymphadenopathy:    He has no cervical adenopathy.  Skin: No rash noted. No erythema.  Psychiatric: He has a normal mood and affect. His  behavior is normal.    BP 138/74 (BP Location: Left Arm, Patient Position: Sitting, Cuff Size: Normal)   Pulse 65   Temp 98.6 F (37 C) (Oral)   Resp 18   Ht '5\' 9"'  (1.753 m)   Wt 181 lb 12.8 oz (82.5 kg)   SpO2 95%   BMI 26.85 kg/m  Wt Readings from Last 3 Encounters:  04/30/17 181 lb 12.8 oz (82.5 kg)  04/29/17 183 lb (83 kg)  04/09/17 183 lb 9.6 oz (83.3 kg)     Lab Results  Component Value Date   WBC 8.1 03/13/2017   HGB 9.6 (L) 03/13/2017   HCT 28.1 (L) 03/13/2017   PLT 585 (H) 03/13/2017   GLUCOSE 98 04/30/2017   CHOL 134 02/14/2016   TRIG 42.0 02/14/2016   HDL 74.80 02/14/2016   LDLCALC 51 02/14/2016   ALT 14 (L) 12/27/2016   AST 19 12/27/2016   NA 132 (L) 04/30/2017   K 5.0 04/30/2017   CL 99 04/30/2017   CREATININE 1.06 04/30/2017   BUN 14 04/30/2017   CO2 25 04/30/2017   TSH 1.51 01/28/2017   PSA 1.33 02/14/2016   INR 1.11 01/08/2017   HGBA1C 5.7 03/04/2016    US Abdomen Complete  Result Date: 04/24/2017 CLINICAL DATA:  Nausea, diarrhea. EXAM: ABDOMEN ULTRASOUND COMPLETE COMPARISON:  CT scan of April 30, 2013. FINDINGS: Gallbladder: Status post cholecystectomy. Common bile duct: Diameter: 2.4 mm which is within normal limits. Liver: No focal lesion identified. Within normal limits in parenchymal echogenicity. Portal vein is patent on color  Doppler imaging with normal direction of blood flow towards the liver. IVC: No abnormality visualized. Pancreas: Visualized portion unremarkable. Spleen: Size and appearance within normal limits. Right Kidney: Length: 10.3 cm. Echogenicity within normal limits. No mass or hydronephrosis visualized. Left Kidney: Length: 11.8 cm. Echogenicity within normal limits. No mass or hydronephrosis visualized. Abdominal aorta: No aneurysm visualized. Other findings: None. IMPRESSION: Status post cholecystectomy. No other abnormality seen in the abdomen. Electronically Signed   By: Marijo Conception, M.D.   On: 04/24/2017 09:12       Assessment & Plan:   Problem List Items Addressed This Visit    Atrial flutter [facility])    S/p cardioversion.  On eliquis.  In SR.  Follow.  Sees cardiology.       CAD (coronary artery disease)    Followed by cardiology.  Continue risk factor modification.  Stable.        Relevant Orders   Hepatic function panel   Lipid panel   Collagenous colitis    Has had GI w/up.  Followed by GI.  Has used entocort.        Depression    On zoloft.  Follow.        Hypertension    Blood pressure under good control.  Continue same medication regimen.  Follow pressures.  Follow metabolic panel.        Relevant Orders   Basic metabolic panel   Hyponatremia - Primary    Seeing nephrology.  Appetite better.  Has decreased alcohol intake.  Recheck sodium today.        Relevant Orders   Basic metabolic panel (Completed)   Iron deficiency anemia    Followed by hematology.       Relevant Orders   CBC with Differential/Platelet   Ferritin   MGUS (monoclonal gammopathy of unknown significance)    Evaluated by hematology.  S/p bone marrow biopsy.  Continue f/u with  hematology.        Moderate COPD (chronic obstructive pulmonary disease) ([facility])    Breathing stable.  Continue current regimen.        Pulmonary hypertension ([place])    Followed by cardiology.       Sleep apnea     CPAP.       Sleep difficulties    Has been better.  Follow.       Thoracic aortic aneurysm ([facility])    Has a 4.0 cm ascending thoracic aortic aneurysm.  Continue abstinence from tobacco and blood pressure control.  Recommended f/u CT chest in one year.  Just evaluated 04/29/17.         Other Visit Diagnoses    Encounter for immunization       Relevant Orders   Basic metabolic panel (Completed)   Flu vaccine HIGH DOSE PF (Completed)   Prostate cancer screening       Relevant Orders   PSA, Medicare       Einar Pheasant, MD

## 2017-05-01 ENCOUNTER — Encounter: Payer: Self-pay | Admitting: Internal Medicine

## 2017-05-02 ENCOUNTER — Encounter: Payer: Self-pay | Admitting: Internal Medicine

## 2017-05-02 DIAGNOSIS — I272 Pulmonary hypertension, unspecified: Secondary | ICD-10-CM | POA: Insufficient documentation

## 2017-05-02 DIAGNOSIS — I4892 Unspecified atrial flutter: Secondary | ICD-10-CM | POA: Insufficient documentation

## 2017-05-02 NOTE — Assessment & Plan Note (Signed)
Followed by cardiology.  Continue risk factor modification.  Stable.   

## 2017-05-02 NOTE — Assessment & Plan Note (Signed)
Has been better.  Follow.

## 2017-05-02 NOTE — Assessment & Plan Note (Signed)
Evaluated by hematology.  S/p bone marrow biopsy.  Continue f/u with hematology.

## 2017-05-02 NOTE — Assessment & Plan Note (Signed)
Has had GI w/up.  Followed by GI.  Has used entocort.

## 2017-05-02 NOTE — Assessment & Plan Note (Signed)
On zoloft.  Follow.  

## 2017-05-02 NOTE — Assessment & Plan Note (Signed)
S/p cardioversion.  On eliquis.  In SR.  Follow.  Sees cardiology.

## 2017-05-02 NOTE — Assessment & Plan Note (Signed)
Has a 4.0 cm ascending thoracic aortic aneurysm.  Continue abstinence from tobacco and blood pressure control.  Recommended f/u CT chest in one year.  Just evaluated 04/29/17.

## 2017-05-02 NOTE — Assessment & Plan Note (Signed)
Followed by cardiology 

## 2017-05-02 NOTE — Assessment & Plan Note (Signed)
Blood pressure under good control.  Continue same medication regimen.  Follow pressures.  Follow metabolic panel.   

## 2017-05-02 NOTE — Assessment & Plan Note (Signed)
Breathing stable.  Continue current regimen.   

## 2017-05-02 NOTE — Assessment & Plan Note (Signed)
CPAP.  

## 2017-05-02 NOTE — Assessment & Plan Note (Signed)
Followed by hematology 

## 2017-05-02 NOTE — Assessment & Plan Note (Signed)
Seeing nephrology.  Appetite better.  Has decreased alcohol intake.  Recheck sodium today.

## 2017-05-06 ENCOUNTER — Ambulatory Visit
Admission: RE | Admit: 2017-05-06 | Discharge: 2017-05-06 | Disposition: A | Discharge status: Home / Self Care | Payer: PPO | Source: Ambulatory Visit | Attending: Gastroenterology | Admitting: Gastroenterology

## 2017-05-06 DIAGNOSIS — R112 Nausea with vomiting, unspecified: Secondary | ICD-10-CM | POA: Insufficient documentation

## 2017-05-06 MED ORDER — TECHNETIUM TC 99M SULFUR COLLOID
2.0000 | Freq: Once | INTRAVENOUS | Status: AC | PRN
  Administered 2017-05-06: 2.45 via INTRAVENOUS

## 2017-05-18 ENCOUNTER — Other Ambulatory Visit: Payer: Self-pay | Admitting: Internal Medicine

## 2017-05-27 ENCOUNTER — Other Ambulatory Visit: Payer: Self-pay | Admitting: Internal Medicine

## 2017-05-28 DIAGNOSIS — R110 Nausea: Secondary | ICD-10-CM | POA: Diagnosis not present

## 2017-05-28 DIAGNOSIS — D509 Iron deficiency anemia, unspecified: Secondary | ICD-10-CM | POA: Diagnosis not present

## 2017-05-28 DIAGNOSIS — K52831 Collagenous colitis: Secondary | ICD-10-CM | POA: Diagnosis not present

## 2017-06-02 DIAGNOSIS — D509 Iron deficiency anemia, unspecified: Secondary | ICD-10-CM | POA: Diagnosis not present

## 2017-06-11 DIAGNOSIS — E7800 Pure hypercholesterolemia, unspecified: Secondary | ICD-10-CM | POA: Diagnosis not present

## 2017-06-11 DIAGNOSIS — J410 Simple chronic bronchitis: Secondary | ICD-10-CM | POA: Diagnosis not present

## 2017-06-11 DIAGNOSIS — Z9889 Other specified postprocedural states: Secondary | ICD-10-CM | POA: Diagnosis not present

## 2017-06-11 DIAGNOSIS — Z720 Tobacco use: Secondary | ICD-10-CM | POA: Diagnosis not present

## 2017-06-11 DIAGNOSIS — R0789 Other chest pain: Secondary | ICD-10-CM | POA: Diagnosis not present

## 2017-06-11 DIAGNOSIS — I10 Essential (primary) hypertension: Secondary | ICD-10-CM | POA: Diagnosis not present

## 2017-06-11 DIAGNOSIS — G4733 Obstructive sleep apnea (adult) (pediatric): Secondary | ICD-10-CM | POA: Diagnosis not present

## 2017-06-12 ENCOUNTER — Telehealth: Payer: Self-pay | Admitting: Internal Medicine

## 2017-06-12 ENCOUNTER — Inpatient Hospital Stay: Payer: PPO | Attending: Oncology | Admitting: Oncology

## 2017-06-12 DIAGNOSIS — E871 Hypo-osmolality and hyponatremia: Secondary | ICD-10-CM

## 2017-06-12 DIAGNOSIS — F329 Major depressive disorder, single episode, unspecified: Secondary | ICD-10-CM

## 2017-06-12 DIAGNOSIS — C880 Waldenstrom macroglobulinemia: Secondary | ICD-10-CM | POA: Diagnosis not present

## 2017-06-12 DIAGNOSIS — K589 Irritable bowel syndrome without diarrhea: Secondary | ICD-10-CM

## 2017-06-12 DIAGNOSIS — I10 Essential (primary) hypertension: Secondary | ICD-10-CM

## 2017-06-12 DIAGNOSIS — D509 Iron deficiency anemia, unspecified: Secondary | ICD-10-CM

## 2017-06-12 DIAGNOSIS — K219 Gastro-esophageal reflux disease without esophagitis: Secondary | ICD-10-CM

## 2017-06-12 DIAGNOSIS — Z86010 Personal history of colonic polyps: Secondary | ICD-10-CM

## 2017-06-12 DIAGNOSIS — E279 Disorder of adrenal gland, unspecified: Secondary | ICD-10-CM

## 2017-06-12 DIAGNOSIS — Z87891 Personal history of nicotine dependence: Secondary | ICD-10-CM

## 2017-06-12 DIAGNOSIS — E875 Hyperkalemia: Secondary | ICD-10-CM

## 2017-06-12 DIAGNOSIS — Z125 Encounter for screening for malignant neoplasm of prostate: Secondary | ICD-10-CM

## 2017-06-12 DIAGNOSIS — G629 Polyneuropathy, unspecified: Secondary | ICD-10-CM

## 2017-06-12 DIAGNOSIS — J449 Chronic obstructive pulmonary disease, unspecified: Secondary | ICD-10-CM

## 2017-06-12 DIAGNOSIS — R0602 Shortness of breath: Secondary | ICD-10-CM

## 2017-06-12 DIAGNOSIS — G4730 Sleep apnea, unspecified: Secondary | ICD-10-CM

## 2017-06-12 DIAGNOSIS — Z79899 Other long term (current) drug therapy: Secondary | ICD-10-CM

## 2017-06-12 DIAGNOSIS — M5136 Other intervertebral disc degeneration, lumbar region: Secondary | ICD-10-CM

## 2017-06-12 DIAGNOSIS — E7800 Pure hypercholesterolemia, unspecified: Secondary | ICD-10-CM

## 2017-06-12 DIAGNOSIS — I2510 Atherosclerotic heart disease of native coronary artery without angina pectoris: Secondary | ICD-10-CM

## 2017-06-12 DIAGNOSIS — Z7901 Long term (current) use of anticoagulants: Secondary | ICD-10-CM

## 2017-06-12 LAB — CBC WITH DIFFERENTIAL/PLATELET
BASOS PCT: 1 %
Basophils Absolute: 0.1 10*3/uL (ref 0–0.1)
Eosinophils Absolute: 0 10*3/uL (ref 0–0.7)
Eosinophils Relative: 0 %
HCT: 37.9 % — ABNORMAL LOW (ref 40.0–52.0)
Hemoglobin: 12.9 g/dL — ABNORMAL LOW (ref 13.0–18.0)
Lymphocytes Relative: 14 %
Lymphs Abs: 1.2 10*3/uL (ref 1.0–3.6)
MCH: 35.1 pg — ABNORMAL HIGH (ref 26.0–34.0)
MCHC: 34 g/dL (ref 32.0–36.0)
MCV: 103.3 fL — ABNORMAL HIGH (ref 80.0–100.0)
MONOS PCT: 6 %
Monocytes Absolute: 0.5 10*3/uL (ref 0.2–1.0)
NEUTROS ABS: 7.1 10*3/uL — AB (ref 1.4–6.5)
NEUTROS PCT: 79 %
Platelets: 457 10*3/uL — ABNORMAL HIGH (ref 150–440)
RBC: 3.67 MIL/uL — ABNORMAL LOW (ref 4.40–5.90)
RDW: 14.8 % — AB (ref 11.5–14.5)
WBC: 9 10*3/uL (ref 3.8–10.6)

## 2017-06-12 LAB — BASIC METABOLIC PANEL
Anion gap: 11 (ref 5–15)
BUN: 14 mg/dL (ref 6–20)
CALCIUM: 9.6 mg/dL (ref 8.9–10.3)
CO2: 23 mmol/L (ref 22–32)
CREATININE: 0.96 mg/dL (ref 0.61–1.24)
Chloride: 96 mmol/L — ABNORMAL LOW (ref 101–111)
Glucose, Bld: 190 mg/dL — ABNORMAL HIGH (ref 65–99)
Potassium: 4 mmol/L (ref 3.5–5.1)
SODIUM: 130 mmol/L — AB (ref 135–145)

## 2017-06-12 LAB — IRON AND TIBC
IRON: 107 ug/dL (ref 45–182)
SATURATION RATIOS: 9 % — AB (ref 17.9–39.5)
TIBC: 1177 ug/dL — AB (ref 250–450)
UIBC: 1070 ug/dL

## 2017-06-12 LAB — FERRITIN: Ferritin: 22 ng/mL — ABNORMAL LOW (ref 24–336)

## 2017-06-12 NOTE — Telephone Encounter (Signed)
Labs have been done today someone must have put in already.

## 2017-06-12 NOTE — Telephone Encounter (Signed)
Lattie Haw from the call center called and stated that she accidentally released the pt's lab orders. Can we re-order? They were BMP, PSA, Hep, Lip, Ferr, CBC w/diff.

## 2017-06-13 ENCOUNTER — Other Ambulatory Visit: Payer: Self-pay | Admitting: Internal Medicine

## 2017-06-13 LAB — IGG, IGA, IGM
IgA: 61 mg/dL (ref 61–437)
IgG (Immunoglobin G), Serum: 587 mg/dL — ABNORMAL LOW (ref 700–1600)
IgM (Immunoglobulin M), Srm: 4932 mg/dL — ABNORMAL HIGH (ref 15–143)

## 2017-06-13 LAB — KAPPA/LAMBDA LIGHT CHAINS
KAPPA FREE LGHT CHN: 64.9 mg/L — AB (ref 3.3–19.4)
Kappa, lambda light chain ratio: 4.88 — ABNORMAL HIGH (ref 0.26–1.65)
Lambda free light chains: 13.3 mg/L (ref 5.7–26.3)

## 2017-06-17 DIAGNOSIS — Z85828 Personal history of other malignant neoplasm of skin: Secondary | ICD-10-CM | POA: Diagnosis not present

## 2017-06-17 DIAGNOSIS — L578 Other skin changes due to chronic exposure to nonionizing radiation: Secondary | ICD-10-CM | POA: Diagnosis not present

## 2017-06-17 DIAGNOSIS — Z86018 Personal history of other benign neoplasm: Secondary | ICD-10-CM | POA: Diagnosis not present

## 2017-06-19 ENCOUNTER — Inpatient Hospital Stay: Payer: PPO | Attending: Oncology | Admitting: Oncology

## 2017-06-19 ENCOUNTER — Other Ambulatory Visit: Payer: PPO | Admitting: Oncology

## 2017-06-19 ENCOUNTER — Telehealth: Payer: Self-pay | Admitting: Oncology

## 2017-06-19 VITALS — BP 148/85 | HR 64 | Temp 98.0°F | Resp 18 | Wt 185.7 lb

## 2017-06-19 DIAGNOSIS — C880 Waldenstrom macroglobulinemia: Secondary | ICD-10-CM

## 2017-06-19 DIAGNOSIS — E875 Hyperkalemia: Secondary | ICD-10-CM | POA: Insufficient documentation

## 2017-06-19 DIAGNOSIS — F329 Major depressive disorder, single episode, unspecified: Secondary | ICD-10-CM | POA: Insufficient documentation

## 2017-06-19 DIAGNOSIS — M5136 Other intervertebral disc degeneration, lumbar region: Secondary | ICD-10-CM | POA: Insufficient documentation

## 2017-06-19 DIAGNOSIS — E871 Hypo-osmolality and hyponatremia: Secondary | ICD-10-CM | POA: Diagnosis not present

## 2017-06-19 DIAGNOSIS — J449 Chronic obstructive pulmonary disease, unspecified: Secondary | ICD-10-CM

## 2017-06-19 DIAGNOSIS — G629 Polyneuropathy, unspecified: Secondary | ICD-10-CM | POA: Diagnosis not present

## 2017-06-19 DIAGNOSIS — Z79899 Other long term (current) drug therapy: Secondary | ICD-10-CM | POA: Diagnosis not present

## 2017-06-19 DIAGNOSIS — R0602 Shortness of breath: Secondary | ICD-10-CM | POA: Insufficient documentation

## 2017-06-19 DIAGNOSIS — K219 Gastro-esophageal reflux disease without esophagitis: Secondary | ICD-10-CM | POA: Insufficient documentation

## 2017-06-19 DIAGNOSIS — G4730 Sleep apnea, unspecified: Secondary | ICD-10-CM | POA: Insufficient documentation

## 2017-06-19 DIAGNOSIS — Z86010 Personal history of colonic polyps: Secondary | ICD-10-CM | POA: Diagnosis not present

## 2017-06-19 DIAGNOSIS — I10 Essential (primary) hypertension: Secondary | ICD-10-CM | POA: Insufficient documentation

## 2017-06-19 DIAGNOSIS — I2510 Atherosclerotic heart disease of native coronary artery without angina pectoris: Secondary | ICD-10-CM | POA: Diagnosis not present

## 2017-06-19 DIAGNOSIS — E279 Disorder of adrenal gland, unspecified: Secondary | ICD-10-CM | POA: Insufficient documentation

## 2017-06-19 DIAGNOSIS — K589 Irritable bowel syndrome without diarrhea: Secondary | ICD-10-CM | POA: Diagnosis not present

## 2017-06-19 DIAGNOSIS — Z87891 Personal history of nicotine dependence: Secondary | ICD-10-CM | POA: Insufficient documentation

## 2017-06-19 DIAGNOSIS — E7800 Pure hypercholesterolemia, unspecified: Secondary | ICD-10-CM | POA: Insufficient documentation

## 2017-06-19 DIAGNOSIS — D509 Iron deficiency anemia, unspecified: Secondary | ICD-10-CM | POA: Insufficient documentation

## 2017-06-19 DIAGNOSIS — Z7901 Long term (current) use of anticoagulants: Secondary | ICD-10-CM

## 2017-06-19 LAB — PROTEIN ELECTROPHORESIS, SERUM
A/G RATIO SPE: 0.7 (ref 0.7–1.7)
Albumin ELP: 3.8 g/dL (ref 2.9–4.4)
Alpha-1-Globulin: 0.2 g/dL (ref 0.0–0.4)
Alpha-2-Globulin: 0.6 g/dL (ref 0.4–1.0)
Beta Globulin: 1.2 g/dL (ref 0.7–1.3)
GLOBULIN, TOTAL: 5.3 g/dL — AB (ref 2.2–3.9)
Gamma Globulin: 3.2 g/dL — ABNORMAL HIGH (ref 0.4–1.8)
M-Spike, %: 2.6 g/dL — ABNORMAL HIGH
Total Protein ELP: 9.1 g/dL — ABNORMAL HIGH (ref 6.0–8.5)

## 2017-06-19 NOTE — Telephone Encounter (Signed)
Updated appts for 3 mths, schd and conf with patient.  Per patient, he will review on MyChart and appt also mailed.

## 2017-06-19 NOTE — Progress Notes (Signed)
[place]  Telephone:([phone]) [phone] Fax:(336) [phone]  ID: Troy Lopez OB: 1940-10-14  MR#: [id number]  URK#:[id number]  Patient Care Team: Einar Pheasant, MD as PCP - General (Internal Medicine)  CHIEF COMPLAINT: [name]'s macroglobulinemia  INTERVAL HISTORY: Patient returns to clinic today for repeat laboratory work and further evaluation.  He feels improved since receiving IV iron 3 months ago.  He currently feels well and is asymptomatic.  He does not complain of weakness or fatigue today. He has no neurologic complaints. He denies any fevers. He has a good appetite and denies weight loss. He has no chest pain or shortness of breath. He denies any nausea, vomiting, constipation, or diarrhea. He has no urinary complaints. Patient offers no further specific complaints today.  REVIEW OF SYSTEMS:   Review of Systems  Constitutional: Negative.  Negative for fever, malaise/fatigue and weight loss.  Respiratory: Negative.  Negative for cough and shortness of breath.   Cardiovascular: Negative.  Negative for chest pain and leg swelling.  Gastrointestinal: Negative.  Negative for abdominal pain, blood in stool, diarrhea and melena.  Musculoskeletal: Positive for back pain.  Skin: Negative.  Negative for rash.  Neurological: Negative.  Negative for sensory change and weakness.  Psychiatric/Behavioral: Negative.  The patient is not nervous/anxious.     As per HPI. Otherwise, a complete review of systems is negative.  PAST MEDICAL HISTORY: Past Medical History:  Diagnosis Date  . Adrenal gland anomaly    enlargement  . CAD (coronary artery disease)   . Carpal tunnel syndrome   . Colonic polyp   . COPD (chronic obstructive pulmonary disease) ([place])   . Degenerative disc disease, lumbar   . Depression   . Diverticulosis   . Dyspnea   . GERD (gastroesophageal reflux disease)   . Hypercholesterolemia   . Hyperkalemia   . Hypertension   . Irritable bowel  syndrome   . Monoclonal gammopathy   . Neuropathy   . Personal history of tobacco use, presenting hazards to health 08/17/2015  . Sleep apnea     PAST SURGICAL HISTORY: Past Surgical History:  Procedure Laterality Date  . BUNIONECTOMY  1989  . CARPAL TUNNEL RELEASE Left 2011   ulnar nerve sub muscular at elbow  . CHOLECYSTECTOMY  09/07  . COLONOSCOPY WITH PROPOFOL N/A 03/05/2017   Procedure: COLONOSCOPY WITH PROPOFOL;  Surgeon: Lucilla Lame, MD;  Location: [facility] ENDOSCOPY;  Service: Endoscopy;  Laterality: N/A;  . ELECTROPHYSIOLOGIC STUDY N/A 11/15/2015   Procedure: CARDIOVERSION;  Surgeon: Isaias Cowman, MD;  Location: [facility] ORS;  Service: Cardiovascular;  Laterality: N/A;  . ESOPHAGOGASTRODUODENOSCOPY (EGD) WITH PROPOFOL N/A 03/05/2017   Procedure: ESOPHAGOGASTRODUODENOSCOPY (EGD) WITH PROPOFOL;  Surgeon: Lucilla Lame, MD;  Location: [facility] ENDOSCOPY;  Service: Endoscopy;  Laterality: N/A;  . EYE SURGERY Bilateral 2010   cataract  . HEMORRHOID SURGERY    . KYPHOPLASTY N/A 11/04/2016   Procedure: KYPHOPLASTY T 12;  Surgeon: Hessie Knows, MD;  Location: [facility] ORS;  Service: Orthopedics;  Laterality: N/A;    FAMILY HISTORY Family History  Problem Relation Age of Onset  . Stroke Mother   . Heart disease Father        MI - 41   . Colon cancer Neg Hx   . Prostate cancer Neg Hx        ADVANCED DIRECTIVES:    HEALTH MAINTENANCE: Social History  Substance Use Topics  . Smoking status: Former Smoker    Quit date: 11/01/2015  . Smokeless tobacco: Never Used  Comment: about 2 cigarettes per day, trying to quit  . Alcohol use 25.2 oz/week    42 Cans of beer per week     Comment: occas     Colonoscopy:  PAP:  Bone density:  Lipid panel:  Allergies  Allergen Reactions  . 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 acetaminophen  (TYLENOL) 500 MG tablet Take 500-1,000 mg by mouth 3 (three) times daily as needed for moderate pain or headache.    . albuterol (PROVENTIL HFA;VENTOLIN HFA) 108 (90 Base) MCG/ACT inhaler Inhale 2 puffs into the lungs every 6 (six) hours as needed for wheezing or shortness of breath.    Marland Kitchen alendronate (FOSAMAX) 70 MG tablet Take 70 mg by mouth once a week. Take with a full glass of water on an empty stomach. Takes on Sundays    . amLODipine (NORVASC) 10 MG tablet Take 1 tablet (5 mg total) by mouth daily. 90 tablet 0  . budesonide (ENTOCORT EC) 3 MG 24 hr capsule Take 9 mg by mouth daily as needed (colitis flare).     . busPIRone (BUSPAR) 10 MG tablet Take 1 tablet (10 mg total) by mouth every evening. 30 tablet 1  . ELIQUIS 5 MG TABS tablet Take 5 mg by mouth 2 (two) times daily.     Marland Kitchen FIBER PO Take 2 capsules by mouth daily.    . fluticasone (FLONASE) 50 MCG/ACT nasal spray Place 2 sprays into both nostrils daily. 16 g 0  . fluticasone furoate-vilanterol (BREO ELLIPTA) 100-25 MCG/INH AEPB Inhale 1 puff into the lungs daily. 180 each 4  . furosemide (LASIX) 20 MG tablet Take 1 tablet (20 mg total) by mouth daily as needed. 30 tablet 0  . gabapentin (NEURONTIN) 100 MG capsule Take two each morning, two each afternoon and three at bedtime 210 capsule 0  . isosorbide mononitrate (IMDUR) 30 MG 24 hr tablet Take 30 mg by mouth daily.     Marland Kitchen losartan (COZAAR) 100 MG tablet Take 1 tablet (100 mg total) by mouth daily. 90 tablet 0  . metoprolol tartrate (LOPRESSOR) 100 MG tablet Take 50 mg by mouth 2 (two) times daily.     . metoprolol tartrate (LOPRESSOR) 100 MG tablet Take 1 tablet (100 mg total) by mouth 2 (two) times daily. 60 tablet 0  . Multiple Vitamin (MULTIVITAMIN WITH MINERALS) TABS tablet Take 1 tablet by mouth daily.    . mupirocin ointment (BACTROBAN) 2 % Place 1 application into the nose 2 (two) times daily. 22 g 0  . pantoprazole (PROTONIX) 40 MG tablet Take 1 tablet (40 mg total) by mouth 2  (two) times daily. 180 tablet 0  . sertraline (ZOLOFT) 50 MG tablet Take 1 tablet (50 mg total) by mouth daily. 30 tablet 0  . vitamin B-12 (CYANOCOBALAMIN) 1000 MCG tablet Take 1,000 mcg by mouth daily.      No current facility-administered medications for this visit.     OBJECTIVE: Vitals:   06/19/17 1203  BP: (!) 148/85  Pulse: 64  Resp: 18  Temp: 98 F (36.7 C)     Body mass index is 27.42 kg/m.    ECOG FS:0 - Asymptomatic  General: Well-developed, well-nourished, no acute distress. Eyes: Pink conjunctiva, anicteric sclera. Lungs: Clear to auscultation bilaterally. Heart: Regular rate and rhythm. No rubs, murmurs, or gallops. Abdomen: Soft, nontender, nondistended. No organomegaly noted,  normoactive bowel sounds. Musculoskeletal: No edema, cyanosis, or clubbing. Neuro: Alert, answering all questions appropriately. Cranial nerves grossly intact. Skin: No rashes or petechiae noted. Psych: Normal affect.   LAB RESULTS:  Lab Results  Component Value Date   NA 130 (L) 06/12/2017   K 4.0 06/12/2017   CL 96 (L) 06/12/2017   CO2 23 06/12/2017   GLUCOSE 190 (H) 06/12/2017   BUN 14 06/12/2017   CREATININE 0.96 06/12/2017   CALCIUM 9.6 06/12/2017   PROT 8.3 (H) 12/27/2016   ALBUMIN 3.4 (L) 12/27/2016   AST 19 12/27/2016   ALT 14 (L) 12/27/2016   ALKPHOS 69 12/27/2016   BILITOT 0.3 12/27/2016   GFRNONAA >60 06/12/2017   GFRAA >60 06/12/2017    Lab Results  Component Value Date   WBC 9.0 06/12/2017   NEUTROABS 7.1 (H) 06/12/2017   HGB 12.9 (L) 06/12/2017   HCT 37.9 (L) 06/12/2017   MCV 103.3 (H) 06/12/2017   PLT 457 (H) 06/12/2017   Lab Results  Component Value Date   IRON 107 06/12/2017   TIBC 1,177 (H) 06/12/2017   IRONPCTSAT 9 (L) 06/12/2017    Lab Results  Component Value Date   FERRITIN 22 (L) 06/12/2017   Lab Results  Component Value Date   TOTALPROTELP 9.1 (H) 06/12/2017   ALBUMINELP 3.8 06/12/2017   A1GS 0.2 06/12/2017   A2GS 0.6 06/12/2017     BETS 1.2 06/12/2017   BETA2SER 0.2 02/14/2016   GAMS 3.2 (H) 06/12/2017   MSPIKE 2.6 (H) 06/12/2017   SPEI Comment 06/12/2017    STUDIES: No results found.  ASSESSMENT: [name]'s macroglobulinemia.  PLAN:    1. [name]'s macroglobulinemia:  Bone marrow biopsy results reviewed independently confirming underlying [name]'s. Patient's most recent M spike from June 12, 2017 has increased to 2.6.  This appears to be erratic- increased to 2.3 nine months ago, but decreased to 1.1 three months ago.  His IgM component is elevated at 4932, but this appears to be erratic as well. Patient has iron deficiency anemia, but no other evidence of endorgan damage. He does not require any treatment at this time. If he had progression of disease as evidenced by B-symptoms or endorgan damage, will consider Rituxan-based therapy.   2. Iron deficiency anemia: Patient's hemoglobin is significantly improved, although his iron stores are still mildly decreased.  He does not require additional IV Feraheme today.  Patient will return to clinic in 3 months with repeat laboratory work and further evaluation. 3. Hyponatremia: Mild, monitor.  Approximately 30 minutes was spent in discussion of which greater than 50% was consultation.  Patient expressed understanding and was in agreement with this plan. He also understands that He can call clinic at any time with any questions, concerns, or complaints.    Lloyd Huger, MD   06/20/2017 9:04 AM

## 2017-06-24 ENCOUNTER — Encounter: Payer: Self-pay | Admitting: Pulmonary Disease

## 2017-06-24 ENCOUNTER — Ambulatory Visit: Payer: PPO | Admitting: Pulmonary Disease

## 2017-06-24 VITALS — BP 152/86 | HR 72 | Ht 69.0 in | Wt 184.0 lb

## 2017-06-24 DIAGNOSIS — Z87891 Personal history of nicotine dependence: Secondary | ICD-10-CM

## 2017-06-24 DIAGNOSIS — J439 Emphysema, unspecified: Secondary | ICD-10-CM | POA: Diagnosis not present

## 2017-06-24 DIAGNOSIS — J449 Chronic obstructive pulmonary disease, unspecified: Secondary | ICD-10-CM

## 2017-06-24 MED ORDER — TIOTROPIUM BROMIDE MONOHYDRATE 1.25 MCG/ACT IN AERS: 2.5000 ug | INHALATION_SPRAY | Freq: Every day | RESPIRATORY_TRACT | 10 refills | Status: DC

## 2017-06-24 NOTE — Patient Instructions (Signed)
Continue Breo inhaler Continue albuterol as needed Trial of Spiriva Respimat inhaler -2 inhalations daily.  You may use on 1 week, off 1 week, etc. to determine whether it improves shortness of breath Follow-up in 4-6 months

## 2017-06-24 NOTE — Progress Notes (Signed)
PULMONARY OFFICE FOLLOW UP NOTE  Requesting MD/Service: Einar Pheasant, MD Date of initial consultation: 09/18/15 Reason for consultation: Dyspnea  PT PROFILE: 76 y.o. M smoker underwent initial evaluation for progressive dyspnea on 09/18/15 with finding of cardiac arrhythmia - probable atrial flutter with variable conduction and frequent PVCs. Initial impression: Likely COPD. Acute worsening of respiratory symptoms possibly due to cardiac arrhythmia. Case discussed with Dr Saralyn Pilar. Trial of Incruse initiated  Events/Data: NM stress 08/17/15: 1. Equivocal ETT. 2. Normal left ventricular function. 3. Normal wall motion. 4. No evidence for scar or ischemia TTE 08/02/15: LVEF 50%, modertate LVH. Mod MR. RVSP est 57 mmHg. LA moderately dilated 09/22/15 PFTs: mild-mod obstruction. Normal TLC. Severe reduction DLCO 10/09/15 ambulatory oximetry: no desaturation.  11/15/15: Successful DCCV ([place])  SUBJ: This is a routine reevaluation.  He has had no major events since last seen.  He remains approximately at his previous baseline with no new complaints.  Remains moderately limited by DOE (class III) and fatigue. Remains on Breo inhaler although he is not convinced that it provides him much benefit. He continues to rarely use albuterol inhaler.  Since last visit, he has relapsed on the smoking briefly but presently is not smoking   Vitals:   06/24/17 1015 06/24/17 1018  BP:  (!) 152/86  Pulse:  72  SpO2:  96%  Weight: 83.5 kg (184 lb)   Height: 5\' 9"  (1.753 m)      EXAM:  Gen: NAD HEENT: NCAT, WNL Neck: Supple without JVD Lungs: breath sounds mildly diminished, no wheezes or other adventitious sounds Cardiovascular: RRR, no murmurs noted Abdomen: Soft, nontender, normal BS Ext: without clubbing, cyanosis, edema Neuro: grossly intact  DATA:  CXR 03/03/17: No acute cardiac or pulmonary findings  IMPRESSION:   1) Former smoker - presently abstinent 2) COPD, predominantly  emphysema - mild-mod obstruction.  Little apparent response to ICS/LABA 3) Exertional dyspnea - well explained by COPD  PLAN:  Continue Breo inhaler Continue albuterol as needed Trial of Spiriva Respimat inhaler - 2 inhalations daily.  May use on 1 week, off 1 week, etc. to determine whether it improves shortness of breath Follow-up in 4-6 months  Merton Border, MD PCCM service Mobile [phone] Pager [phone] 06/24/2017 10:33 AM

## 2017-06-27 NOTE — Progress Notes (Signed)
error 

## 2017-07-04 ENCOUNTER — Encounter: Payer: Self-pay | Admitting: Internal Medicine

## 2017-07-07 ENCOUNTER — Ambulatory Visit: Payer: PPO | Admitting: Family Medicine

## 2017-07-07 ENCOUNTER — Encounter: Payer: Self-pay | Admitting: Family Medicine

## 2017-07-07 VITALS — BP 146/82 | HR 62 | Temp 98.2°F | Ht 69.0 in | Wt 187.0 lb

## 2017-07-07 DIAGNOSIS — B029 Zoster without complications: Secondary | ICD-10-CM | POA: Diagnosis not present

## 2017-07-07 MED ORDER — FAMCICLOVIR 500 MG PO TABS: 500.0000 mg | ORAL_TABLET | Freq: Three times a day (TID) | ORAL | 0 refills | Status: DC

## 2017-07-07 NOTE — Progress Notes (Signed)
Subjective:    Patient ID: Troy Lopez, male    DOB: [birth year], 76 y.o.   MRN: [id number]  HPI  Troy Lopez is a 76 yo male who presents today with burning sensation on right buttock that presented 2 days ago. He states that slight burning may have been present one day prior but does not endorse one week as noted previously. He reports that this can also be noticed medially but does not cross the midline. He describes this as a 2 and burning. Burning was aggravated by driving 3 hours 2 days ago and is aggravated with fabric that touches his skin. He reports prior history of shingles and the discomfort feels the same to him. Treatment at home with OTC cream for discomfort that did not provide benefit. No injury to area No history of Shingles vaccine. Denies fever, chills, sweats, N/V, numbness, or weakness.  Review of Systems  Constitutional: Negative for chills, fatigue and fever.  Respiratory: Negative for cough, shortness of breath and wheezing.   Cardiovascular: Negative for chest pain and palpitations.  Gastrointestinal: Negative for abdominal pain, constipation, diarrhea, nausea and vomiting.  Skin: Negative for rash.       Burning sensation on buttock  Neurological: Negative for dizziness, weakness, light-headedness and numbness.  Psychiatric/Behavioral:       Denies depressed or anxious   Past Medical History:  Diagnosis Date  . Adrenal gland anomaly    enlargement  . CAD (coronary artery disease)   . Carpal tunnel syndrome   . Colonic polyp   . COPD (chronic obstructive pulmonary disease) ([place])   . Degenerative disc disease, lumbar   . Depression   . Diverticulosis   . Dyspnea   . GERD (gastroesophageal reflux disease)   . Hypercholesterolemia   . Hyperkalemia   . Hypertension   . Irritable bowel syndrome   . Monoclonal gammopathy   . Neuropathy   . Personal history of tobacco use, presenting hazards to health 08/17/2015  . Sleep apnea      Social History     Socioeconomic History  . Marital status: Married    Spouse name: Not on file  . Number of children: 3  . Years of education: Not on file  . Highest education level: Not on file  Social Needs  . Financial resource strain: Not on file  . Food insecurity - worry: Not on file  . Food insecurity - inability: Not on file  . Transportation needs - medical: Not on file  . Transportation needs - non-medical: Not on file  Occupational History  . Not on file  Tobacco Use  . Smoking status: Former Smoker    Last attempt to quit: 11/01/2015    Years since quitting: 1.6  . Smokeless tobacco: Never Used  . Tobacco comment: about 2 cigarettes per day, trying to quit  Substance and Sexual Activity  . Alcohol use: Yes    Alcohol/week: 25.2 oz    Types: 42 Cans of beer per week    Comment: occas  . Drug use: No  . Sexual activity: Not on file  Other Topics Concern  . Not on file  Social History Narrative  . Not on file    Past Surgical History:  Procedure Laterality Date  . BUNIONECTOMY  1989  . CARDIOVERSION N/A 11/15/2015   Performed by Isaias Cowman, MD at [facility] ORS  . CARPAL TUNNEL RELEASE Left 2011   ulnar nerve sub muscular at elbow  . CHOLECYSTECTOMY  09/07  .  COLONOSCOPY WITH PROPOFOL N/A 03/05/2017   Performed by Lucilla Lame, MD at [place]  . ESOPHAGOGASTRODUODENOSCOPY (EGD) WITH PROPOFOL N/A 03/05/2017   Performed by Lucilla Lame, MD at [place]  . EYE SURGERY Bilateral 2010   cataract  . HEMORRHOID SURGERY    . KYPHOPLASTY T 12 N/A 11/04/2016   Performed by Hessie Knows, MD at [facility] ORS    Family History  Problem Relation Age of Onset  . Stroke Mother   . Heart disease Father        MI - 78   . Colon cancer Neg Hx   . Prostate cancer Neg Hx     Allergies  Allergen Reactions  . Ivp Dye [Iodinated Diagnostic Agents]     Contraindication secondary to IGMgammopathy/Waldren's syndrome      Current Outpatient Medications on File Prior to Visit   Medication Sig Dispense Refill  . acetaminophen (TYLENOL) 500 MG tablet Take 500-1,000 mg by mouth 3 (three) times daily as needed for moderate pain or headache.    . albuterol (PROVENTIL HFA;VENTOLIN HFA) 108 (90 Base) MCG/ACT inhaler Inhale 2 puffs into the lungs every 6 (six) hours as needed for wheezing or shortness of breath.    Marland Kitchen alendronate (FOSAMAX) 70 MG tablet Take 70 mg by mouth once a week. Take with a full glass of water on an empty stomach. Takes on Sundays    . amLODipine (NORVASC) 10 MG tablet Take 1 tablet (5 mg total) by mouth daily. 90 tablet 0  . budesonide (ENTOCORT EC) 3 MG 24 hr capsule Take 9 mg by mouth daily as needed (colitis flare).     . busPIRone (BUSPAR) 10 MG tablet Take 1 tablet (10 mg total) by mouth every evening. 30 tablet 1  . ELIQUIS 5 MG TABS tablet Take 5 mg by mouth 2 (two) times daily.     Marland Kitchen FIBER PO Take 2 capsules by mouth daily.    . fluticasone (FLONASE) 50 MCG/ACT nasal spray Place 2 sprays into both nostrils daily. 16 g 0  . fluticasone furoate-vilanterol (BREO ELLIPTA) 100-25 MCG/INH AEPB Inhale 1 puff into the lungs daily. 180 each 4  . furosemide (LASIX) 20 MG tablet Take 1 tablet (20 mg total) by mouth daily as needed. 30 tablet 0  . gabapentin (NEURONTIN) 100 MG capsule Take two each morning, two each afternoon and three at bedtime 210 capsule 0  . isosorbide mononitrate (IMDUR) 30 MG 24 hr tablet Take 30 mg by mouth daily.     Marland Kitchen losartan (COZAAR) 100 MG tablet Take 1 tablet (100 mg total) by mouth daily. 90 tablet 0  . metoprolol tartrate (LOPRESSOR) 100 MG tablet Take 50 mg by mouth 2 (two) times daily.     . Multiple Vitamin (MULTIVITAMIN WITH MINERALS) TABS tablet Take 1 tablet by mouth daily.    . pantoprazole (PROTONIX) 40 MG tablet Take 1 tablet (40 mg total) by mouth 2 (two) times daily. 180 tablet 0  . sertraline (ZOLOFT) 50 MG tablet Take 1 tablet (50 mg total) by mouth daily. 30 tablet 0  . Tiotropium Bromide Monohydrate (SPIRIVA  RESPIMAT) 1.25 MCG/ACT AERS Inhale 2.5 mcg daily into the lungs. 4 g 10  . vitamin B-12 (CYANOCOBALAMIN) 1000 MCG tablet Take 1,000 mcg by mouth daily.      No current facility-administered medications on file prior to visit.     BP (!) 146/82   Pulse 62   Temp 98.2 F (36.8 C)   Ht 5\' 9"  (  1.753 m)   Wt 187 lb (84.8 kg)   SpO2 98%   BMI 27.62 kg/m       Objective:   Physical Exam  Constitutional: He is oriented to person, place, and time. He appears well-developed and well-nourished.  Eyes: Pupils are equal, round, and reactive to light. No scleral icterus.  Neck: Neck supple.  Cardiovascular: Normal rate, regular rhythm and intact distal pulses.  Pulmonary/Chest: Effort normal and breath sounds normal. He has no wheezes. He has no rales.  Abdominal: Soft. Bowel sounds are normal. There is no tenderness.  Musculoskeletal: He exhibits no edema.  Lymphadenopathy:    He has no cervical adenopathy.  Neurological: He is alert and oriented to person, place, and time.  Skin: Skin is warm and dry. No rash noted.  No erythema, vesicles, or rash noted on buttock. Burning sensation noted along S1 dermatome.  Psychiatric: He has a normal mood and affect. His behavior is normal. Judgment and thought content normal.      Assessment & Plan:  1. Herpes zoster without complication Symptom and distribution of burning sensation is concerning. Suspect early symptoms for herpes zoster. Patient endorses that symptom feels the same as prior episode of herpes zoster. Will initiate empiric treatment with famciclovir and advised close follow up and provided written return precautions.  Follow up with PCP for routine health maintenance.  Delano Metz, FNP-C

## 2017-07-07 NOTE — Telephone Encounter (Signed)
Is he having a rash?  Given symptoms, would prefer he be evaluated.  Recommend to acute care for evaluation today - then can f/u

## 2017-07-07 NOTE — Patient Instructions (Signed)
Please take medication as directed and follow up if symptoms do not improve with treatment, worsen, or you develop new symptoms.   Shingles Shingles is an infection that causes a painful skin rash and fluid-filled blisters. Shingles is caused by the same virus that causes chickenpox. Shingles only develops in people who:  Have had chickenpox.  Have gotten the chickenpox vaccine. (This is rare.)  The first symptoms of shingles may be itching, tingling, or pain in an area on your skin. A rash will follow in a few days or weeks. The rash is usually on one side of the body in a bandlike or beltlike pattern. Over time, the rash turns into fluid-filled blisters that break open, scab over, and dry up. Medicines may:  Help you manage pain.  Help you recover more quickly.  Help to prevent long-term problems.  Follow these instructions at home: Medicines  Take medicines only as told by your doctor.  Apply an anti-itch or numbing cream to the affected area as told by your doctor. Blister and Rash Care  Take a cool bath or put cool compresses on the area of the rash or blisters as told by your doctor. This may help with pain and itching.  Keep your rash covered with a loose bandage (dressing). Wear loose-fitting clothing.  Keep your rash and blisters clean with mild soap and cool water or as told by your doctor.  Check your rash every day for signs of infection. These include redness, swelling, and pain that lasts or gets worse.  Do not pick your blisters.  Do not scratch your rash. General instructions  Rest as told by your doctor.  Keep all follow-up visits as told by your doctor. This is important.  Until your blisters scab over, your infection can cause chickenpox in people who have never had it or been vaccinated against it. To prevent this from happening, avoid touching other people or being around other people, especially: ? Babies. ? Pregnant women. ? Children who have  eczema. ? Elderly people who have transplants. ? People who have chronic illnesses, such as leukemia or AIDS. Contact a doctor if:  Your pain does not get better with medicine.  Your pain does not get better after the rash heals.  Your rash looks infected. Signs of infection include: ? Redness. ? Swelling. ? Pain that lasts or gets worse. Get help right away if:  The rash is on your face or nose.  You have pain in your face, pain around your eye area, or loss of feeling on one side of your face.  You have ear pain or you have ringing in your ear.  You have loss of taste.  Your condition gets worse. This information is not intended to replace advice given to you by your health care provider. Make sure you discuss any questions you have with your health care provider. Document Released: 01/22/2008 Document Revised: 03/31/2016 Document Reviewed: 05/17/2014 Elsevier Interactive Patient Education  [employer].

## 2017-07-08 NOTE — Telephone Encounter (Signed)
I schedule with NP on 07/07/17 Sorry I thought I put in my note.

## 2017-07-17 ENCOUNTER — Telehealth: Payer: Self-pay

## 2017-07-17 NOTE — Telephone Encounter (Signed)
Is he having a rash?  Per previous visit, no rash present.   Wasn't sure if he has since developed rash.  If rash and definitely shingles, the medication he was given was an antiviral medication.  It is basically given to hopefully help shorten the course.  It is not a pain medication.  The gabapentin he is taking would help with pain.  If feels needs to be seen, I can see him tomorrow at 12:00.

## 2017-07-17 NOTE — Telephone Encounter (Signed)
No rash. Patient says he will continue the gabapentin and give it a little longer. if symptoms get worse he will call back.

## 2017-07-17 NOTE — Telephone Encounter (Signed)
Spoke with patient. He was seen on 11/19 for possible shingles. Patient was given famciclovir 500 mg TID x7 days. Patient says he completed the medication on Monday and does not see where it helped at all. He is still having the pain in his right side in the groin/buttocks area. No new symptoms and pain has not moved. Please advise.   Copied from [place] [phone]. Topic: General - Other >> Jul 17, 2017  8:34 AM [name] wrote: Reason for CRM: Patient stated he was in the office on the 19th by [facility] for shingles. He said he was told to call back if he didn't feel better & he says he does not. Please give him a call @ [phone]

## 2017-07-21 ENCOUNTER — Other Ambulatory Visit: Payer: Self-pay | Admitting: Internal Medicine

## 2017-07-23 DIAGNOSIS — M47816 Spondylosis without myelopathy or radiculopathy, lumbar region: Secondary | ICD-10-CM | POA: Diagnosis not present

## 2017-08-07 ENCOUNTER — Encounter: Payer: Self-pay | Admitting: Internal Medicine

## 2017-08-10 MED ORDER — GABAPENTIN 100 MG PO CAPS: ORAL_CAPSULE | ORAL | 1 refills | Status: DC

## 2017-08-10 NOTE — Telephone Encounter (Signed)
rx sent in for gabapentin.  

## 2017-08-14 ENCOUNTER — Other Ambulatory Visit: Payer: PPO

## 2017-08-14 ENCOUNTER — Telehealth: Payer: Self-pay | Admitting: Radiology

## 2017-08-14 NOTE — Telephone Encounter (Signed)
Pt coming in for labs Monday, please place future orders. Thank you 

## 2017-08-15 ENCOUNTER — Other Ambulatory Visit: Payer: Self-pay | Admitting: Internal Medicine

## 2017-08-15 DIAGNOSIS — I2510 Atherosclerotic heart disease of native coronary artery without angina pectoris: Secondary | ICD-10-CM

## 2017-08-15 DIAGNOSIS — R739 Hyperglycemia, unspecified: Secondary | ICD-10-CM

## 2017-08-15 DIAGNOSIS — I10 Essential (primary) hypertension: Secondary | ICD-10-CM

## 2017-08-15 DIAGNOSIS — E871 Hypo-osmolality and hyponatremia: Secondary | ICD-10-CM

## 2017-08-15 NOTE — Progress Notes (Signed)
Orders placed for labs

## 2017-08-15 NOTE — Telephone Encounter (Signed)
Orders placed for labs

## 2017-08-18 ENCOUNTER — Other Ambulatory Visit (INDEPENDENT_AMBULATORY_CARE_PROVIDER_SITE_OTHER): Payer: PPO

## 2017-08-18 DIAGNOSIS — I2510 Atherosclerotic heart disease of native coronary artery without angina pectoris: Secondary | ICD-10-CM | POA: Diagnosis not present

## 2017-08-18 DIAGNOSIS — R739 Hyperglycemia, unspecified: Secondary | ICD-10-CM

## 2017-08-18 DIAGNOSIS — E871 Hypo-osmolality and hyponatremia: Secondary | ICD-10-CM | POA: Diagnosis not present

## 2017-08-18 DIAGNOSIS — I10 Essential (primary) hypertension: Secondary | ICD-10-CM

## 2017-08-18 LAB — HEPATIC FUNCTION PANEL
ALT: 9 U/L (ref 0–53)
AST: 15 U/L (ref 0–37)
Albumin: 3.5 g/dL (ref 3.5–5.2)
Alkaline Phosphatase: 53 U/L (ref 39–117)
BILIRUBIN TOTAL: 0.3 mg/dL (ref 0.2–1.2)
Bilirubin, Direct: 0 mg/dL (ref 0.0–0.3)
Total Protein: 8.3 g/dL (ref 6.0–8.3)

## 2017-08-18 LAB — LIPID PANEL
CHOLESTEROL: 130 mg/dL (ref 0–200)
HDL: 56.8 mg/dL (ref >39.00)
LDL Cholesterol: 64 mg/dL (ref 0–99)
NonHDL: 73.16
Total CHOL/HDL Ratio: 2
Triglycerides: 48 mg/dL (ref 0.0–149.0)
VLDL: 9.6 mg/dL (ref 0.0–40.0)

## 2017-08-18 LAB — BASIC METABOLIC PANEL
BUN: 12 mg/dL (ref 6–23)
CALCIUM: 9.3 mg/dL (ref 8.4–10.5)
CO2: 24 mEq/L (ref 19–32)
CREATININE: 0.88 mg/dL (ref 0.40–1.50)
Chloride: 98 mEq/L (ref 96–112)
GFR: 89.63 mL/min (ref >60.00)
Glucose, Bld: 90 mg/dL (ref 70–99)
Potassium: 4.8 mEq/L (ref 3.5–5.1)
Sodium: 130 mEq/L — ABNORMAL LOW (ref 135–145)

## 2017-08-18 LAB — HEMOGLOBIN A1C: HEMOGLOBIN A1C: 5.7 % (ref 4.6–6.5)

## 2017-08-18 LAB — TSH: TSH: 1.75 u[IU]/mL (ref 0.35–4.50)

## 2017-08-19 ENCOUNTER — Encounter: Payer: Self-pay | Admitting: Internal Medicine

## 2017-08-20 ENCOUNTER — Other Ambulatory Visit: Payer: Self-pay | Admitting: Internal Medicine

## 2017-08-21 ENCOUNTER — Ambulatory Visit: Payer: PPO | Admitting: Oncology

## 2017-08-21 ENCOUNTER — Other Ambulatory Visit: Payer: PPO | Admitting: Oncology

## 2017-08-22 ENCOUNTER — Encounter: Payer: Self-pay | Admitting: Internal Medicine

## 2017-08-22 ENCOUNTER — Ambulatory Visit (INDEPENDENT_AMBULATORY_CARE_PROVIDER_SITE_OTHER): Payer: PPO | Admitting: Internal Medicine

## 2017-08-22 DIAGNOSIS — F32A Depression, unspecified: Secondary | ICD-10-CM

## 2017-08-22 DIAGNOSIS — M549 Dorsalgia, unspecified: Secondary | ICD-10-CM

## 2017-08-22 DIAGNOSIS — I712 Thoracic aortic aneurysm, without rupture, unspecified: Secondary | ICD-10-CM

## 2017-08-22 DIAGNOSIS — D509 Iron deficiency anemia, unspecified: Secondary | ICD-10-CM

## 2017-08-22 DIAGNOSIS — K219 Gastro-esophageal reflux disease without esophagitis: Secondary | ICD-10-CM | POA: Diagnosis not present

## 2017-08-22 DIAGNOSIS — I272 Pulmonary hypertension, unspecified: Secondary | ICD-10-CM | POA: Diagnosis not present

## 2017-08-22 DIAGNOSIS — Z23 Encounter for immunization: Secondary | ICD-10-CM | POA: Diagnosis not present

## 2017-08-22 DIAGNOSIS — I4892 Unspecified atrial flutter: Secondary | ICD-10-CM | POA: Diagnosis not present

## 2017-08-22 DIAGNOSIS — J449 Chronic obstructive pulmonary disease, unspecified: Secondary | ICD-10-CM | POA: Diagnosis not present

## 2017-08-22 DIAGNOSIS — Z0000 Encounter for general adult medical examination without abnormal findings: Secondary | ICD-10-CM

## 2017-08-22 DIAGNOSIS — D472 Monoclonal gammopathy: Secondary | ICD-10-CM

## 2017-08-22 DIAGNOSIS — F329 Major depressive disorder, single episode, unspecified: Secondary | ICD-10-CM

## 2017-08-22 DIAGNOSIS — I10 Essential (primary) hypertension: Secondary | ICD-10-CM | POA: Diagnosis not present

## 2017-08-22 DIAGNOSIS — I2510 Atherosclerotic heart disease of native coronary artery without angina pectoris: Secondary | ICD-10-CM | POA: Diagnosis not present

## 2017-08-22 DIAGNOSIS — K52831 Collagenous colitis: Secondary | ICD-10-CM

## 2017-08-22 DIAGNOSIS — E871 Hypo-osmolality and hyponatremia: Secondary | ICD-10-CM | POA: Diagnosis not present

## 2017-08-22 DIAGNOSIS — G8929 Other chronic pain: Secondary | ICD-10-CM

## 2017-08-22 NOTE — Progress Notes (Signed)
Patient ID: Troy Lopez, male   DOB: 05/08/1941, 77 y.o.   MRN: [id number]   Subjective:    Patient ID: Troy Lopez, male    DOB: 10/16/1940, 77 y.o.   MRN: [id number]  HPI  Patient with past history of hypercholesterolemia, CAD, hypertension and GERD.  He comes in today to follow up on these issues as well as for a complete physical exam.  He reports he is having 3-4 loose stools per day.  Sees GI.  Previously used entocort suppositories.  Helped.  Has a f/u with GI 09/2017.  Eating.  No vomiting.  Still with back pain (right buttock and right crotch).  On gabapentin.  Helping.   Due to f/u with 09/11/17. Plans to discuss f/u injection.  Discussed labs.  Discussed the need to decrease alcohol intake.  Had decreased for a while.  No chest pain.  Breathing stable.     Past Medical History:  Diagnosis Date  . Adrenal gland anomaly    enlargement  . CAD (coronary artery disease)   . Carpal tunnel syndrome   . Colonic polyp   . COPD (chronic obstructive pulmonary disease) ([place])   . Degenerative disc disease, lumbar   . Depression   . Diverticulosis   . Dyspnea   . GERD (gastroesophageal reflux disease)   . Hypercholesterolemia   . Hyperkalemia   . Hypertension   . Irritable bowel syndrome   . Monoclonal gammopathy   . Neuropathy   . Personal history of tobacco use, presenting hazards to health 08/17/2015  . Sleep apnea    Past Surgical History:  Procedure Laterality Date  . BUNIONECTOMY  1989  . CARPAL TUNNEL RELEASE Left 2011   ulnar nerve sub muscular at elbow  . CHOLECYSTECTOMY  09/07  . COLONOSCOPY WITH PROPOFOL N/A 03/05/2017   Procedure: COLONOSCOPY WITH PROPOFOL;  Surgeon: Lucilla Lame, MD;  Location: [facility] ENDOSCOPY;  Service: Endoscopy;  Laterality: N/A;  . ELECTROPHYSIOLOGIC STUDY N/A 11/15/2015   Procedure: CARDIOVERSION;  Surgeon: Isaias Cowman, MD;  Location: [facility] ORS;  Service: Cardiovascular;  Laterality: N/A;  . ESOPHAGOGASTRODUODENOSCOPY (EGD) WITH  PROPOFOL N/A 03/05/2017   Procedure: ESOPHAGOGASTRODUODENOSCOPY (EGD) WITH PROPOFOL;  Surgeon: Lucilla Lame, MD;  Location: [facility] ENDOSCOPY;  Service: Endoscopy;  Laterality: N/A;  . EYE SURGERY Bilateral 2010   cataract  . HEMORRHOID SURGERY    . KYPHOPLASTY N/A 11/04/2016   Procedure: KYPHOPLASTY T 12;  Surgeon: Hessie Knows, MD;  Location: [facility] ORS;  Service: Orthopedics;  Laterality: N/A;   Family History  Problem Relation Age of Onset  . Stroke Mother   . Heart disease Father        MI - 53   . Colon cancer Neg Hx   . Prostate cancer Neg Hx    Social History   Socioeconomic History  . Marital status: Married    Spouse name: None  . Number of children: 3  . Years of education: None  . Highest education level: None  Social Needs  . Financial resource strain: None  . Food insecurity - worry: None  . Food insecurity - inability: None  . Transportation needs - medical: None  . Transportation needs - non-medical: None  Occupational History  . None  Tobacco Use  . Smoking status: Former Smoker    Last attempt to quit: 11/01/2015    Years since quitting: 1.8  . Smokeless tobacco: Never Used  . Tobacco comment: about 2 cigarettes per day, trying to quit  Substance and  Sexual Activity  . Alcohol use: Yes    Alcohol/week: 25.2 oz    Types: 42 Cans of beer per week    Comment: occas  . Drug use: No  . Sexual activity: None  Other Topics Concern  . None  Social History Narrative  . None    Outpatient Encounter Medications as of 08/22/2017  Medication Sig  . acetaminophen (TYLENOL) 500 MG tablet Take 500-1,000 mg by mouth 3 (three) times daily as needed for moderate pain or headache.  . albuterol (PROVENTIL HFA;VENTOLIN HFA) 108 (90 Base) MCG/ACT inhaler Inhale 2 puffs into the lungs every 6 (six) hours as needed for wheezing or shortness of breath.  Marland Kitchen alendronate (FOSAMAX) 70 MG tablet Take 70 mg by mouth once a week. Take with a full glass of water on an empty  stomach. Takes on Sundays  . amLODipine (NORVASC) 10 MG tablet Take 1 tablet (5 mg total) by mouth daily.  . budesonide (ENTOCORT EC) 3 MG 24 hr capsule Take 9 mg by mouth daily as needed (colitis flare).   Marland Kitchen ELIQUIS 5 MG TABS tablet Take 5 mg by mouth 2 (two) times daily.   . famciclovir (FAMVIR) 500 MG tablet Take 1 tablet (500 mg total) 3 (three) times daily by mouth.  . FIBER PO Take 2 capsules by mouth daily.  . fluticasone (FLONASE) 50 MCG/ACT nasal spray Place 2 sprays into both nostrils daily.  . fluticasone furoate-vilanterol (BREO ELLIPTA) 100-25 MCG/INH AEPB Inhale 1 puff into the lungs daily.  . furosemide (LASIX) 20 MG tablet Take 1 tablet (20 mg total) by mouth daily as needed.  . gabapentin (NEURONTIN) 100 MG capsule Take 4 capsules tid  . isosorbide mononitrate (IMDUR) 30 MG 24 hr tablet Take 30 mg by mouth daily.   Marland Kitchen losartan (COZAAR) 100 MG tablet Take 1 tablet (100 mg total) by mouth daily.  . metoprolol tartrate (LOPRESSOR) 100 MG tablet Take 1 tablet (100 mg total) by mouth 2 (two) times daily.  . Multiple Vitamin (MULTIVITAMIN WITH MINERALS) TABS tablet Take 1 tablet by mouth daily.  . pantoprazole (PROTONIX) 40 MG tablet Take 1 tablet (40 mg total) by mouth 2 (two) times daily.  . sertraline (ZOLOFT) 50 MG tablet Take 1 tablet (50 mg total) by mouth daily.  . Tiotropium Bromide Monohydrate (SPIRIVA RESPIMAT) 1.25 MCG/ACT AERS Inhale 2.5 mcg daily into the lungs.  . vitamin B-12 (CYANOCOBALAMIN) 1000 MCG tablet Take 1,000 mcg by mouth daily.   . [DISCONTINUED] busPIRone (BUSPAR) 10 MG tablet Take 1 tablet (10 mg total) by mouth every evening.   No facility-administered encounter medications on file as of 08/22/2017.     Review of Systems  Constitutional: Negative for appetite change and unexpected weight change.  HENT: Negative for congestion and sinus pressure.   Eyes: Negative for pain and visual disturbance.  Respiratory: Negative for cough and chest tightness.         Breathing stable.    Cardiovascular: Negative for chest pain and palpitations.  Gastrointestinal: Negative for abdominal pain, nausea and vomiting.       Loose stool and bowel flares as outlined.    Genitourinary: Negative for difficulty urinating and dysuria.  Musculoskeletal: Positive for back pain. Negative for joint swelling.  Skin: Negative for color change and rash.  Neurological: Negative for dizziness, light-headedness and headaches.  Hematological: Negative for adenopathy. Does not bruise/bleed easily.  Psychiatric/Behavioral: Negative for agitation and dysphoric mood.       Objective:  Physical Exam  Constitutional: He is oriented to person, place, and time. He appears well-developed and well-nourished. No distress.  HENT:  Head: Normocephalic and atraumatic.  Nose: Nose normal.  Mouth/Throat: Oropharynx is clear and moist. No oropharyngeal exudate.  Eyes: Conjunctivae are normal. Right eye exhibits no discharge. Left eye exhibits no discharge.  Neck: Neck supple. No thyromegaly present.  Cardiovascular: Normal rate and regular rhythm.  Pulmonary/Chest: Breath sounds normal. No respiratory distress. He has no wheezes.  Abdominal: Soft. Bowel sounds are normal. There is no tenderness.  Genitourinary:  Genitourinary Comments: Not performed.    Musculoskeletal: He exhibits no edema or tenderness.  Lymphadenopathy:    He has no cervical adenopathy.  Neurological: He is alert and oriented to person, place, and time.  Skin: Skin is warm and dry. No rash noted. No erythema.  Psychiatric: He has a normal mood and affect. His behavior is normal.    BP 128/60   Pulse 63   Temp 97.6 F (36.4 C) (Oral)   Ht '5\' 9"'  (1.753 m)   Wt 188 lb (85.3 kg)   SpO2 95%   BMI 27.76 kg/m  Wt Readings from Last 3 Encounters:  08/22/17 188 lb (85.3 kg)  07/07/17 187 lb (84.8 kg)  06/24/17 184 lb (83.5 kg)     Lab Results  Component Value Date   WBC 9.0 06/12/2017   HGB 12.9  (L) 06/12/2017   HCT 37.9 (L) 06/12/2017   PLT 457 (H) 06/12/2017   GLUCOSE 90 08/18/2017   CHOL 130 08/18/2017   TRIG 48.0 08/18/2017   HDL 56.80 08/18/2017   LDLCALC 64 08/18/2017   ALT 9 08/18/2017   AST 15 08/18/2017   NA 130 (L) 08/18/2017   K 4.8 08/18/2017   CL 98 08/18/2017   CREATININE 0.88 08/18/2017   BUN 12 08/18/2017   CO2 24 08/18/2017   TSH 1.75 08/18/2017   PSA 1.33 02/14/2016   INR 1.11 01/08/2017   HGBA1C 5.7 08/18/2017    Nm Gastric Emptying  Result Date: 05/06/2017 CLINICAL DATA:  Nausea and vomiting. EXAM: NUCLEAR MEDICINE GASTRIC EMPTYING SCAN TECHNIQUE: After oral ingestion of radiolabeled meal, sequential abdominal images were obtained for 4 hours. Percentage of activity emptying the stomach was calculated at 1 hour, 2 hour, 3 hour, and 4 hours. RADIOPHARMACEUTICALS:  0.76 millicuries mCi [id number] sulfur colloid in standardized meal COMPARISON:  None. FINDINGS: Expected location of the stomach in the left upper quadrant. Ingested meal empties the stomach quickly over the course of the study. 79% emptied at 1 hr ( normal >= 10%) 95% emptied at 2 hr ( normal >= 40%) 100% emptied at 3 hr ( normal >= 70%) IMPRESSION: Normal gastric emptying study. Electronically Signed   By: Dorise Bullion III M.D   On: 05/06/2017 13:49       Assessment & Plan:   Problem List Items Addressed This Visit    Atrial flutter [facility])    S/p cardioversion.  On eliquis.  In SR.  Followed by cardiology.        CAD (coronary artery disease)    Followed by cardiology.  Continue risk factor modification.  Stable.        Chronic back pain    Increased pain recently as outlined.  On gabapentin.  Helping some.  Has f/u planned with Dr Sharlet [place] 09/11/17.       Collagenous colitis    Used entocort.  Helped.  Recently worsening.  Has f/u with GI in 09/2017.  Will contact regarding earlier appt.        Depression    On zoloft.  Stable.        GERD (gastroesophageal reflux disease)     Controlled on current regimen.  Follow.       Health care maintenance    Physical today 08/22/17.  Colonoscopy 08/25/14.  Due psa.  Not drawn with last labs.        Hypertension    Blood pressure under good control.  Continue same medication regimen.  Follow pressures.  Follow metabolic panel.        Hyponatremia    Has been worked up by nephrology.  Sodium just checked.  130.  Decrease alcohol intake.        Iron deficiency anemia    Followed by hematology.        MGUS (monoclonal gammopathy of unknown significance)    Evaluated by hematology.  S/p bone marrow biopsy.  Continue f/u with hematology.       Moderate COPD (chronic obstructive pulmonary disease) ([facility])    Breathing stable.  Continue current regimen.  Follow.        Pulmonary hypertension ([place])    Followed by cardiology.        Thoracic aortic aneurysm ([facility])    Has a 4.0 cm ascending thoracic aortic aneurysm.  Recommended f/u CT chest in one year.  Last evaluated 04/29/17.            Einar Pheasant, MD

## 2017-08-22 NOTE — Progress Notes (Signed)
Pre visit review using our clinic review tool, if applicable. No additional management support is needed unless otherwise documented below in the visit note. 

## 2017-08-24 ENCOUNTER — Encounter: Payer: Self-pay | Admitting: Internal Medicine

## 2017-08-24 NOTE — Assessment & Plan Note (Signed)
Followed by cardiology 

## 2017-08-24 NOTE — Assessment & Plan Note (Signed)
On zoloft.  Stable.  

## 2017-08-24 NOTE — Assessment & Plan Note (Signed)
Followed by cardiology.  Continue risk factor modification.  Stable.   

## 2017-08-24 NOTE — Assessment & Plan Note (Signed)
S/p cardioversion.  On eliquis.  In SR.  Followed by cardiology.

## 2017-08-24 NOTE — Assessment & Plan Note (Signed)
Evaluated by hematology.  S/p bone marrow biopsy.  Continue f/u with hematology.

## 2017-08-24 NOTE — Assessment & Plan Note (Signed)
Increased pain recently as outlined.  On gabapentin.  Helping some.  Has f/u planned with Dr Sharlet [place] 09/11/17.

## 2017-08-24 NOTE — Assessment & Plan Note (Signed)
Followed by hematology 

## 2017-08-24 NOTE — Assessment & Plan Note (Signed)
Has a 4.0 cm ascending thoracic aortic aneurysm.  Recommended f/u CT chest in one year.  Last evaluated 04/29/17.

## 2017-08-24 NOTE — Assessment & Plan Note (Signed)
Has been worked up by nephrology.  Sodium just checked.  130.  Decrease alcohol intake.

## 2017-08-24 NOTE — Assessment & Plan Note (Signed)
Breathing stable.  Continue current regimen.  Follow.   

## 2017-08-24 NOTE — Assessment & Plan Note (Signed)
Blood pressure under good control.  Continue same medication regimen.  Follow pressures.  Follow metabolic panel.   

## 2017-08-24 NOTE — Assessment & Plan Note (Signed)
Controlled on current regimen.  Follow.  

## 2017-08-24 NOTE — Assessment & Plan Note (Signed)
Used entocort.  Helped.  Recently worsening.  Has f/u with GI in 09/2017.  Will contact regarding earlier appt.

## 2017-08-24 NOTE — Assessment & Plan Note (Signed)
Physical today 08/22/17.  Colonoscopy 08/25/14.  Due psa.  Not drawn with last labs.

## 2017-08-26 DIAGNOSIS — M47816 Spondylosis without myelopathy or radiculopathy, lumbar region: Secondary | ICD-10-CM | POA: Diagnosis not present

## 2017-08-28 ENCOUNTER — Ambulatory Visit: Payer: PPO | Admitting: Oncology

## 2017-09-12 ENCOUNTER — Encounter: Payer: Self-pay | Admitting: Internal Medicine

## 2017-09-12 ENCOUNTER — Other Ambulatory Visit: Payer: Self-pay

## 2017-09-12 MED ORDER — PANTOPRAZOLE SODIUM 40 MG PO TBEC: DELAYED_RELEASE_TABLET | ORAL | 0 refills | Status: DC

## 2017-09-15 MED ORDER — MUPIROCIN 2 % EX OINT: 1.0000 "application " | TOPICAL_OINTMENT | Freq: Two times a day (BID) | CUTANEOUS | 0 refills | Status: DC

## 2017-09-15 NOTE — Telephone Encounter (Signed)
rx sent in for bactroban 

## 2017-09-17 DIAGNOSIS — M4316 Spondylolisthesis, lumbar region: Secondary | ICD-10-CM | POA: Diagnosis not present

## 2017-09-17 DIAGNOSIS — M4696 Unspecified inflammatory spondylopathy, lumbar region: Secondary | ICD-10-CM | POA: Diagnosis not present

## 2017-09-18 ENCOUNTER — Inpatient Hospital Stay: Payer: PPO | Attending: Oncology

## 2017-09-18 DIAGNOSIS — C880 Waldenstrom macroglobulinemia: Secondary | ICD-10-CM | POA: Diagnosis not present

## 2017-09-18 LAB — CBC WITH DIFFERENTIAL/PLATELET
Basophils Absolute: 0.1 10*3/uL (ref 0–0.1)
Basophils Relative: 1 %
EOS PCT: 1 %
Eosinophils Absolute: 0 10*3/uL (ref 0–0.7)
HCT: 30.5 % — ABNORMAL LOW (ref 40.0–52.0)
Hemoglobin: 10.2 g/dL — ABNORMAL LOW (ref 13.0–18.0)
LYMPHS ABS: 1.4 10*3/uL (ref 1.0–3.6)
LYMPHS PCT: 23 %
MCH: 33.6 pg (ref 26.0–34.0)
MCHC: 33.5 g/dL (ref 32.0–36.0)
MCV: 100 fL (ref 80.0–100.0)
Monocytes Absolute: 0.4 10*3/uL (ref 0.2–1.0)
Monocytes Relative: 7 %
Neutro Abs: 4.2 10*3/uL (ref 1.4–6.5)
Neutrophils Relative %: 68 %
PLATELETS: 508 10*3/uL — AB (ref 150–440)
RBC: 3.05 MIL/uL — ABNORMAL LOW (ref 4.40–5.90)
RDW: 15.5 % — ABNORMAL HIGH (ref 11.5–14.5)
WBC: 6.1 10*3/uL (ref 3.8–10.6)

## 2017-09-18 LAB — IRON AND TIBC
Iron: 55 ug/dL (ref 45–182)
SATURATION RATIOS: 5 % — AB (ref 17.9–39.5)
TIBC: 1137 ug/dL — ABNORMAL HIGH (ref 250–450)
UIBC: 1082 ug/dL

## 2017-09-18 LAB — BASIC METABOLIC PANEL
Anion gap: 10 (ref 5–15)
BUN: 15 mg/dL (ref 6–20)
CALCIUM: 9.5 mg/dL (ref 8.9–10.3)
CO2: 23 mmol/L (ref 22–32)
CREATININE: 1.12 mg/dL (ref 0.61–1.24)
Chloride: 97 mmol/L — ABNORMAL LOW (ref 101–111)
GFR calc Af Amer: >60 mL/min (ref >60)
GLUCOSE: 101 mg/dL — AB (ref 65–99)
Potassium: 4.9 mmol/L (ref 3.5–5.1)
Sodium: 130 mmol/L — ABNORMAL LOW (ref 135–145)

## 2017-09-18 LAB — FERRITIN: Ferritin: 18 ng/mL — ABNORMAL LOW (ref 24–336)

## 2017-09-19 LAB — IGG, IGA, IGM
IGA: 40 mg/dL — AB (ref 61–437)
IGG (IMMUNOGLOBIN G), SERUM: 546 mg/dL — AB (ref 700–1600)
IGM (IMMUNOGLOBULIN M), SRM: 3635 mg/dL — AB (ref 15–143)

## 2017-09-19 LAB — KAPPA/LAMBDA LIGHT CHAINS
Kappa free light chain: 73 mg/L — ABNORMAL HIGH (ref 3.3–19.4)
Kappa, lambda light chain ratio: 5.41 — ABNORMAL HIGH (ref 0.26–1.65)
LAMDA FREE LIGHT CHAINS: 13.5 mg/L (ref 5.7–26.3)

## 2017-09-20 NOTE — Progress Notes (Signed)
[name]  [phone] [phone]  ID: Troy Lopez OB: 07/03/1941  MR#: [id number]  AST#:[id number]  Patient Care Team: Einar Pheasant, MD as PCP - General (Internal Medicine)  CHIEF COMPLAINT: [name]'s macroglobulinemia  INTERVAL HISTORY: Patient returns to clinic today for repeat laboratory work and further evaluation.  He has noted some increasing weakness and fatigue over the past several weeks, but otherwise feels well. He has no neurologic complaints. He denies any recent fevers or illnesses.  He has a good appetite and denies weight loss. He has no chest pain or shortness of breath. He denies any nausea, vomiting, constipation, or diarrhea.  He has no melena or hematochezia.  He has no urinary complaints. Patient offers no further specific complaints today.  REVIEW OF SYSTEMS:   Review of Systems  Constitutional: Positive for malaise/fatigue. Negative for fever and weight loss.  Respiratory: Negative.  Negative for cough, hemoptysis and shortness of breath.   Cardiovascular: Negative.  Negative for chest pain and leg swelling.  Gastrointestinal: Negative.  Negative for abdominal pain, blood in stool, diarrhea and melena.  Genitourinary: Negative.  Negative for hematuria.  Musculoskeletal: Positive for back pain.  Skin: Negative.  Negative for rash.  Neurological: Positive for weakness. Negative for sensory change.  Psychiatric/Behavioral: Negative.  The patient is not nervous/anxious.     As per HPI. Otherwise, a complete review of systems is negative.  PAST MEDICAL HISTORY: Past Medical History:  Diagnosis Date  . Adrenal gland anomaly    enlargement  . CAD (coronary artery disease)   . Carpal tunnel syndrome   . Colonic polyp   . COPD (chronic obstructive pulmonary disease) ([place])   . Degenerative disc disease, lumbar   . Depression   . Diverticulosis   . Dyspnea   . GERD (gastroesophageal reflux disease)   .  Hypercholesterolemia   . Hyperkalemia   . Hypertension   . Irritable bowel syndrome   . Monoclonal gammopathy   . Neuropathy   . Personal history of tobacco use, presenting hazards to health 08/17/2015  . Sleep apnea     PAST SURGICAL HISTORY: Past Surgical History:  Procedure Laterality Date  . BUNIONECTOMY  1989  . CARPAL TUNNEL RELEASE Left 2011   ulnar nerve sub muscular at elbow  . CHOLECYSTECTOMY  09/07  . COLONOSCOPY WITH PROPOFOL N/A 03/05/2017   Procedure: COLONOSCOPY WITH PROPOFOL;  Surgeon: Lucilla Lame, MD;  Location: [facility] ENDOSCOPY;  Service: Endoscopy;  Laterality: N/A;  . ELECTROPHYSIOLOGIC STUDY N/A 11/15/2015   Procedure: CARDIOVERSION;  Surgeon: Isaias Cowman, MD;  Location: [facility] ORS;  Service: Cardiovascular;  Laterality: N/A;  . ESOPHAGOGASTRODUODENOSCOPY (EGD) WITH PROPOFOL N/A 03/05/2017   Procedure: ESOPHAGOGASTRODUODENOSCOPY (EGD) WITH PROPOFOL;  Surgeon: Lucilla Lame, MD;  Location: [facility] ENDOSCOPY;  Service: Endoscopy;  Laterality: N/A;  . EYE SURGERY Bilateral 2010   cataract  . HEMORRHOID SURGERY    . KYPHOPLASTY N/A 11/04/2016   Procedure: KYPHOPLASTY T 12;  Surgeon: Hessie Knows, MD;  Location: [facility] ORS;  Service: Orthopedics;  Laterality: N/A;    FAMILY HISTORY Family History  Problem Relation Age of Onset  . Stroke Mother   . Heart disease Father        MI - 55   . Colon cancer Neg Hx   . Prostate cancer Neg Hx        ADVANCED DIRECTIVES:    HEALTH MAINTENANCE: Social History   Tobacco Use  . Smoking status: Former Smoker    Last attempt to  quit: 11/01/2015    Years since quitting: 1.9  . Smokeless tobacco: Never Used  . Tobacco comment: about 2 cigarettes per day, trying to quit  Substance Use Topics  . Alcohol use: Yes    Alcohol/week: 25.2 oz    Types: 42 Cans of beer per week    Comment: occas  . Drug use: No     Colonoscopy:  PAP:  Bone density:  Lipid panel:  Allergies  Allergen Reactions  . Ivp Dye [Iodinated  Diagnostic Agents]     Contraindication secondary to IGMgammopathy/Waldren's syndrome      Current Outpatient Medications  Medication Sig Dispense Refill  . acetaminophen (TYLENOL) 500 MG tablet Take 500-1,000 mg by mouth 3 (three) times daily as needed for moderate pain or headache.    . albuterol (PROVENTIL HFA;VENTOLIN HFA) 108 (90 Base) MCG/ACT inhaler Inhale 2 puffs into the lungs every 6 (six) hours as needed for wheezing or shortness of breath.    Marland Kitchen alendronate (FOSAMAX) 70 MG tablet Take 70 mg by mouth once a week. Take with a full glass of water on an empty stomach. Takes on Sundays    . amLODipine (NORVASC) 10 MG tablet Take 1 tablet (5 mg total) by mouth daily. 90 tablet 0  . budesonide (ENTOCORT EC) 3 MG 24 hr capsule Take 9 mg by mouth daily as needed (colitis flare).     Marland Kitchen ELIQUIS 5 MG TABS tablet Take 5 mg by mouth 2 (two) times daily.     . famciclovir (FAMVIR) 500 MG tablet Take 1 tablet (500 mg total) 3 (three) times daily by mouth. 21 tablet 0  . FIBER PO Take 2 capsules by mouth daily.    . fluticasone (FLONASE) 50 MCG/ACT nasal spray Place 2 sprays into both nostrils daily. 16 g 0  . fluticasone furoate-vilanterol (BREO ELLIPTA) 100-25 MCG/INH AEPB Inhale 1 puff into the lungs daily. 180 each 4  . furosemide (LASIX) 20 MG tablet Take 1 tablet (20 mg total) by mouth daily as needed. 30 tablet 0  . gabapentin (NEURONTIN) 100 MG capsule Take 4 capsules tid 360 capsule 1  . isosorbide mononitrate (IMDUR) 30 MG 24 hr tablet Take 30 mg by mouth daily.     Marland Kitchen losartan (COZAAR) 100 MG tablet Take 1 tablet (100 mg total) by mouth daily. 90 tablet 0  . metoprolol tartrate (LOPRESSOR) 100 MG tablet Take 1 tablet (100 mg total) by mouth 2 (two) times daily. 60 tablet 0  . mupirocin ointment (BACTROBAN) 2 % Apply 1 application topically 2 (two) times daily. 22 g 0  . pantoprazole (PROTONIX) 40 MG tablet Take 1 tablet (40 mg total) by mouth 2 (two) times daily. 180 tablet 0  .  sertraline (ZOLOFT) 50 MG tablet Take 1 tablet (50 mg total) by mouth daily. 30 tablet 0  . Tiotropium Bromide Monohydrate (SPIRIVA RESPIMAT) 1.25 MCG/ACT AERS Inhale 2.5 mcg daily into the lungs. 4 g 10  . vitamin B-12 (CYANOCOBALAMIN) 1000 MCG tablet Take 1,000 mcg by mouth daily.      No current facility-administered medications for this visit.     OBJECTIVE: Vitals:   09/23/17 1436  BP: 127/79  Pulse: 66     Body mass index is 27.59 kg/m.    ECOG FS:0 - Asymptomatic  General: Well-developed, well-nourished, no acute distress. Eyes: Pink conjunctiva, anicteric sclera. Lungs: Clear to auscultation bilaterally. Heart: Regular rate and rhythm. No rubs, murmurs, or gallops. Abdomen: Soft, nontender, nondistended. No organomegaly noted, normoactive bowel  sounds. Musculoskeletal: No edema, cyanosis, or clubbing. Neuro: Alert, answering all questions appropriately. Cranial nerves grossly intact. Skin: No rashes or petechiae noted. Psych: Normal affect.   LAB RESULTS:  Lab Results  Component Value Date   NA 130 (L) 09/18/2017   K 4.9 09/18/2017   CL 97 (L) 09/18/2017   CO2 23 09/18/2017   GLUCOSE 101 (H) 09/18/2017   BUN 15 09/18/2017   CREATININE 1.12 09/18/2017   CALCIUM 9.5 09/18/2017   PROT 8.3 08/18/2017   ALBUMIN 3.5 08/18/2017   AST 15 08/18/2017   ALT 9 08/18/2017   ALKPHOS 53 08/18/2017   BILITOT 0.3 08/18/2017   GFRNONAA >60 09/18/2017   GFRAA >60 09/18/2017    Lab Results  Component Value Date   WBC 6.1 09/18/2017   NEUTROABS 4.2 09/18/2017   HGB 10.2 (L) 09/18/2017   HCT 30.5 (L) 09/18/2017   MCV 100.0 09/18/2017   PLT 508 (H) 09/18/2017   Lab Results  Component Value Date   IRON 55 09/18/2017   TIBC 1,137 (H) 09/18/2017   IRONPCTSAT 5 (L) 09/18/2017    Lab Results  Component Value Date   FERRITIN 18 (L) 09/18/2017   Lab Results  Component Value Date   TOTALPROTELP 8.3 09/18/2017   ALBUMINELP 3.0 09/18/2017   A1GS 0.6 (H) 09/18/2017    A2GS 0.9 09/18/2017   BETS 1.0 09/18/2017   BETA2SER 0.2 02/14/2016   GAMS 2.9 (H) 09/18/2017   MSPIKE 2.3 (H) 09/18/2017   SPEI Comment 09/18/2017    STUDIES: No results found.  ASSESSMENT: [name]'s macroglobulinemia.  PLAN:    1. [name]'s macroglobulinemia:  Bone marrow biopsy results reviewed independently confirming underlying [name]'s. Patient's most recent M spike from September 18, 2017 is essentially stable at 2.3.  His IgM remains significantly elevated as well, but essentially unchanged. Patient has iron deficiency anemia, but no other evidence of endorgan damage. He does not require any treatment at this time. If he had progression of disease as evidenced by B-symptoms or endorgan damage, will consider Rituxan-based therapy.   2. Iron deficiency anemia: Patient's hemoglobin has trended down along with his iron stores and he is symptomatic.  He declined treatment today, but has agreed to return to clinic in 1 and 2 weeks to receive 510 mg of IV Feraheme.  Patient will then return to clinic in 3 months with repeat laboratory work and further evaluation. 3. Hyponatremia: Mild, monitor.  Approximately 30 minutes was spent in discussion of which greater than 50% was consultation.  Patient expressed understanding and was in agreement with this plan. He also understands that He can call clinic at any time with any questions, concerns, or complaints.    Lloyd Huger, MD   09/26/2017 7:00 PM

## 2017-09-22 ENCOUNTER — Other Ambulatory Visit: Payer: Self-pay | Admitting: Gastroenterology

## 2017-09-22 DIAGNOSIS — K210 Gastro-esophageal reflux disease with esophagitis: Secondary | ICD-10-CM | POA: Diagnosis not present

## 2017-09-22 DIAGNOSIS — K52831 Collagenous colitis: Secondary | ICD-10-CM | POA: Diagnosis not present

## 2017-09-22 DIAGNOSIS — D509 Iron deficiency anemia, unspecified: Secondary | ICD-10-CM

## 2017-09-23 ENCOUNTER — Other Ambulatory Visit: Payer: Self-pay

## 2017-09-23 ENCOUNTER — Inpatient Hospital Stay: Payer: PPO | Attending: Oncology | Admitting: Oncology

## 2017-09-23 ENCOUNTER — Encounter: Payer: Self-pay | Admitting: Oncology

## 2017-09-23 VITALS — BP 127/79 | HR 66 | Wt 186.8 lb

## 2017-09-23 DIAGNOSIS — M5136 Other intervertebral disc degeneration, lumbar region: Secondary | ICD-10-CM | POA: Diagnosis not present

## 2017-09-23 DIAGNOSIS — G629 Polyneuropathy, unspecified: Secondary | ICD-10-CM | POA: Diagnosis not present

## 2017-09-23 DIAGNOSIS — Z79899 Other long term (current) drug therapy: Secondary | ICD-10-CM | POA: Diagnosis not present

## 2017-09-23 DIAGNOSIS — E7800 Pure hypercholesterolemia, unspecified: Secondary | ICD-10-CM | POA: Diagnosis not present

## 2017-09-23 DIAGNOSIS — R531 Weakness: Secondary | ICD-10-CM | POA: Diagnosis not present

## 2017-09-23 DIAGNOSIS — E875 Hyperkalemia: Secondary | ICD-10-CM | POA: Diagnosis not present

## 2017-09-23 DIAGNOSIS — C880 Waldenstrom macroglobulinemia: Secondary | ICD-10-CM | POA: Diagnosis not present

## 2017-09-23 DIAGNOSIS — Z8719 Personal history of other diseases of the digestive system: Secondary | ICD-10-CM

## 2017-09-23 DIAGNOSIS — I2510 Atherosclerotic heart disease of native coronary artery without angina pectoris: Secondary | ICD-10-CM | POA: Diagnosis not present

## 2017-09-23 DIAGNOSIS — G4730 Sleep apnea, unspecified: Secondary | ICD-10-CM | POA: Diagnosis not present

## 2017-09-23 DIAGNOSIS — R5383 Other fatigue: Secondary | ICD-10-CM | POA: Diagnosis not present

## 2017-09-23 DIAGNOSIS — F329 Major depressive disorder, single episode, unspecified: Secondary | ICD-10-CM

## 2017-09-23 DIAGNOSIS — Z86010 Personal history of colonic polyps: Secondary | ICD-10-CM

## 2017-09-23 DIAGNOSIS — D509 Iron deficiency anemia, unspecified: Secondary | ICD-10-CM | POA: Diagnosis not present

## 2017-09-23 DIAGNOSIS — I10 Essential (primary) hypertension: Secondary | ICD-10-CM

## 2017-09-23 DIAGNOSIS — E278 Other specified disorders of adrenal gland: Secondary | ICD-10-CM | POA: Diagnosis not present

## 2017-09-23 DIAGNOSIS — E871 Hypo-osmolality and hyponatremia: Secondary | ICD-10-CM

## 2017-09-23 DIAGNOSIS — J449 Chronic obstructive pulmonary disease, unspecified: Secondary | ICD-10-CM | POA: Diagnosis not present

## 2017-09-23 DIAGNOSIS — K589 Irritable bowel syndrome without diarrhea: Secondary | ICD-10-CM

## 2017-09-23 DIAGNOSIS — Z87891 Personal history of nicotine dependence: Secondary | ICD-10-CM | POA: Diagnosis not present

## 2017-09-23 DIAGNOSIS — M549 Dorsalgia, unspecified: Secondary | ICD-10-CM | POA: Diagnosis not present

## 2017-09-23 DIAGNOSIS — K219 Gastro-esophageal reflux disease without esophagitis: Secondary | ICD-10-CM | POA: Diagnosis not present

## 2017-09-23 LAB — PROTEIN ELECTROPHORESIS, SERUM
A/G Ratio: 0.6 — ABNORMAL LOW (ref 0.7–1.7)
ALBUMIN ELP: 3 g/dL (ref 2.9–4.4)
ALPHA-1-GLOBULIN: 0.6 g/dL — AB (ref 0.0–0.4)
ALPHA-2-GLOBULIN: 0.9 g/dL (ref 0.4–1.0)
BETA GLOBULIN: 1 g/dL (ref 0.7–1.3)
GAMMA GLOBULIN: 2.9 g/dL — AB (ref 0.4–1.8)
Globulin, Total: 5.3 g/dL — ABNORMAL HIGH (ref 2.2–3.9)
M-SPIKE, %: 2.3 g/dL — AB
TOTAL PROTEIN ELP: 8.3 g/dL (ref 6.0–8.5)

## 2017-09-25 ENCOUNTER — Inpatient Hospital Stay: Payer: PPO

## 2017-09-25 ENCOUNTER — Ambulatory Visit: Payer: PPO | Admitting: Oncology

## 2017-09-25 VITALS — BP 134/65 | HR 72 | Temp 96.8°F | Resp 20

## 2017-09-25 DIAGNOSIS — C880 Waldenstrom macroglobulinemia: Secondary | ICD-10-CM | POA: Diagnosis not present

## 2017-09-25 DIAGNOSIS — D509 Iron deficiency anemia, unspecified: Secondary | ICD-10-CM

## 2017-09-25 MED ORDER — SODIUM CHLORIDE 0.9 % IV SOLN
510.0000 mg | Freq: Once | INTRAVENOUS | Status: AC
  Administered 2017-09-25: 510 mg via INTRAVENOUS
  Filled 2017-09-25: qty 17

## 2017-09-25 MED ORDER — SODIUM CHLORIDE 0.9 % IV SOLN
Freq: Once | INTRAVENOUS | Status: AC
  Administered 2017-09-25: 09:00:00 via INTRAVENOUS
  Filled 2017-09-25: qty 1000

## 2017-09-25 MED ORDER — FERUMOXYTOL INJECTION 510 MG/17 ML
INTRAVENOUS | Status: AC
  Filled 2017-09-25: qty 17

## 2017-09-25 NOTE — Patient Instructions (Signed)

## 2017-09-29 ENCOUNTER — Ambulatory Visit
Admission: RE | Admit: 2017-09-29 | Discharge: 2017-09-29 | Disposition: A | Discharge status: Home / Self Care | Payer: PPO | Source: Ambulatory Visit | Attending: Gastroenterology | Admitting: Gastroenterology

## 2017-09-29 DIAGNOSIS — D509 Iron deficiency anemia, unspecified: Secondary | ICD-10-CM | POA: Insufficient documentation

## 2017-10-03 ENCOUNTER — Inpatient Hospital Stay: Payer: PPO

## 2017-10-03 VITALS — BP 142/76 | HR 62 | Temp 95.7°F | Resp 18

## 2017-10-03 DIAGNOSIS — D509 Iron deficiency anemia, unspecified: Secondary | ICD-10-CM

## 2017-10-03 DIAGNOSIS — C880 Waldenstrom macroglobulinemia: Secondary | ICD-10-CM | POA: Diagnosis not present

## 2017-10-03 MED ORDER — SODIUM CHLORIDE 0.9 % IV SOLN
510.0000 mg | Freq: Once | INTRAVENOUS | Status: AC
  Administered 2017-10-03: 510 mg via INTRAVENOUS
  Filled 2017-10-03: qty 17

## 2017-10-03 MED ORDER — SODIUM CHLORIDE 0.9 % IV SOLN
Freq: Once | INTRAVENOUS | Status: AC
  Administered 2017-10-03: 09:00:00 via INTRAVENOUS
  Filled 2017-10-03: qty 1000

## 2017-10-03 MED ORDER — FERUMOXYTOL INJECTION 510 MG/17 ML
INTRAVENOUS | Status: AC
  Filled 2017-10-03: qty 17

## 2017-10-03 NOTE — Patient Instructions (Signed)

## 2017-10-09 ENCOUNTER — Ambulatory Visit (INDEPENDENT_AMBULATORY_CARE_PROVIDER_SITE_OTHER): Payer: PPO | Admitting: Family Medicine

## 2017-10-09 ENCOUNTER — Encounter: Payer: Self-pay | Admitting: Family Medicine

## 2017-10-09 ENCOUNTER — Ambulatory Visit (INDEPENDENT_AMBULATORY_CARE_PROVIDER_SITE_OTHER): Payer: PPO

## 2017-10-09 VITALS — BP 140/82 | HR 69 | Temp 98.0°F | Resp 12 | Wt 189.1 lb

## 2017-10-09 DIAGNOSIS — M7989 Other specified soft tissue disorders: Secondary | ICD-10-CM

## 2017-10-09 DIAGNOSIS — S81811A Laceration without foreign body, right lower leg, initial encounter: Secondary | ICD-10-CM | POA: Diagnosis not present

## 2017-10-09 NOTE — Patient Instructions (Signed)
Foot Pain Many things can cause foot pain. Some common causes are:  An injury.  A sprain.  Arthritis.  Blisters.  Bunions.  Follow these instructions at home: Pay attention to any changes in your symptoms. Take these actions to help with your discomfort:  If directed, put ice on the affected area: ? Put ice in a plastic bag. ? Place a towel between your skin and the bag. ? Leave the ice on for 15-20 minutes, 3?4 times a day for 2 days.  Take over-the-counter and prescription medicines only as told by your health care provider.  Wear comfortable, supportive shoes that fit you well. Do not wear high heels.  Do not stand or walk for long periods of time.  Do not lift a lot of weight. This can put added pressure on your feet.  Do stretches to relieve foot pain and stiffness as told by your health care provider.  Rub your foot gently.  Keep your feet clean and dry.  Contact a health care provider if:  Your pain does not get better after a few days of self-care.  Your pain gets worse.  You cannot stand on your foot. Get help right away if:  Your foot is numb or tingling.  Your foot or toes are swollen.  Your foot or toes turn white or blue.  You have warmth and redness along your foot. This information is not intended to replace advice given to you by your health care provider. Make sure you discuss any questions you have with your health care provider. Document Released: 09/01/2015 Document Revised: 01/11/2016 Document Reviewed: 08/31/2014 Elsevier Interactive Patient Education  2018 [employer]  

## 2017-10-09 NOTE — Progress Notes (Signed)
Subjective:    Patient ID: TAIDEN RAYBOURN, male    DOB: 05-Jul-1941, 77 y.o.   MRN: [id number]  HPI  Mr. Rosenwald is a 77 year old male who presents with right leg and foot pain that occurred 6 days ago after a piece of lumber hit his leg. A piece of lumber struck the front of his leg while he was unloading a trailer. He experienced a skin tear with bruising that is improving. He reports quickly moving when lumber struck his leg however does not report lumber directly striking his foot. Skin tear has been treated with  neosporin and vaseline to keep bandage from adhering to skin. He denies any drainage from the area. Associated ecchymosis present with minimal swelling at the site of the skin tear. No warmth appreciated. He is currently taking Eliquis.   Right foot tenderness, swelling, and ecchymosis prompted patient to seek care today. He denies damage to the skin of his foot and reports mild tenderness that is related to swelling when wearing his tennis shoes. He denies pain with ambulation or pain that wakes him at night. No numbness, tingling, or weakness reported. No treatments have been tried at this time. He has not elevated foot or used ice.   Review of Systems  Constitutional: Negative for chills, fatigue and fever.  Respiratory: Negative for cough, shortness of breath and wheezing.   Cardiovascular: Negative for chest pain, palpitations and leg swelling.  Musculoskeletal: Negative for myalgias.       Right foot swelling and pain  Skin: Positive for wound. Negative for pallor and rash.       Skin tear on right lower extremity  Neurological: Negative for dizziness, weakness, light-headedness and headaches.   Past Medical History:  Diagnosis Date  . Adrenal gland anomaly    enlargement  . CAD (coronary artery disease)   . Carpal tunnel syndrome   . Colonic polyp   . COPD (chronic obstructive pulmonary disease) ([place])   . Degenerative disc disease, lumbar   . Depression   .  Diverticulosis   . Dyspnea   . GERD (gastroesophageal reflux disease)   . Hypercholesterolemia   . Hyperkalemia   . Hypertension   . Irritable bowel syndrome   . Monoclonal gammopathy   . Neuropathy   . Personal history of tobacco use, presenting hazards to health 08/17/2015  . Sleep apnea      Social History   Socioeconomic History  . Marital status: Married    Spouse name: Not on file  . Number of children: 3  . Years of education: Not on file  . Highest education level: Not on file  Social Needs  . Financial resource strain: Not on file  . Food insecurity - worry: Not on file  . Food insecurity - inability: Not on file  . Transportation needs - medical: Not on file  . Transportation needs - non-medical: Not on file  Occupational History  . Not on file  Tobacco Use  . Smoking status: Former Smoker    Last attempt to quit: 11/01/2015    Years since quitting: 1.9  . Smokeless tobacco: Never Used  . Tobacco comment: about 2 cigarettes per day, trying to quit  Substance and Sexual Activity  . Alcohol use: Yes    Alcohol/week: 25.2 oz    Types: 42 Cans of beer per week    Comment: occas  . Drug use: No  . Sexual activity: Not on file  Other Topics Concern  .  Not on file  Social History Narrative  . Not on file    Past Surgical History:  Procedure Laterality Date  . BUNIONECTOMY  1989  . CARPAL TUNNEL RELEASE Left 2011   ulnar nerve sub muscular at elbow  . CHOLECYSTECTOMY  09/07  . COLONOSCOPY WITH PROPOFOL N/A 03/05/2017   Procedure: COLONOSCOPY WITH PROPOFOL;  Surgeon: Lucilla Lame, MD;  Location: [facility] ENDOSCOPY;  Service: Endoscopy;  Laterality: N/A;  . ELECTROPHYSIOLOGIC STUDY N/A 11/15/2015   Procedure: CARDIOVERSION;  Surgeon: Isaias Cowman, MD;  Location: [facility] ORS;  Service: Cardiovascular;  Laterality: N/A;  . ESOPHAGOGASTRODUODENOSCOPY (EGD) WITH PROPOFOL N/A 03/05/2017   Procedure: ESOPHAGOGASTRODUODENOSCOPY (EGD) WITH PROPOFOL;  Surgeon: Lucilla Lame, MD;  Location: [facility] ENDOSCOPY;  Service: Endoscopy;  Laterality: N/A;  . EYE SURGERY Bilateral 2010   cataract  . HEMORRHOID SURGERY    . KYPHOPLASTY N/A 11/04/2016   Procedure: KYPHOPLASTY T 12;  Surgeon: Hessie Knows, MD;  Location: [facility] ORS;  Service: Orthopedics;  Laterality: N/A;    Family History  Problem Relation Age of Onset  . Stroke Mother   . Heart disease Father        MI - 79   . Colon cancer Neg Hx   . Prostate cancer Neg Hx     Allergies  Allergen Reactions  . Ivp Dye [Iodinated Diagnostic Agents]     Contraindication secondary to IGMgammopathy/Waldren's syndrome      Current Outpatient Medications on File Prior to Visit  Medication Sig Dispense Refill  . acetaminophen (TYLENOL) 500 MG tablet Take 500-1,000 mg by mouth 3 (three) times daily as needed for moderate pain or headache.    . albuterol (PROVENTIL HFA;VENTOLIN HFA) 108 (90 Base) MCG/ACT inhaler Inhale 2 puffs into the lungs every 6 (six) hours as needed for wheezing or shortness of breath.    Marland Kitchen alendronate (FOSAMAX) 70 MG tablet Take 70 mg by mouth once a week. Take with a full glass of water on an empty stomach. Takes on Sundays    . amLODipine (NORVASC) 10 MG tablet Take 1 tablet (5 mg total) by mouth daily. 90 tablet 0  . budesonide (ENTOCORT EC) 3 MG 24 hr capsule Take 9 mg by mouth daily as needed (colitis flare).     Marland Kitchen ELIQUIS 5 MG TABS tablet Take 5 mg by mouth 2 (two) times daily.     . famciclovir (FAMVIR) 500 MG tablet Take 1 tablet (500 mg total) 3 (three) times daily by mouth. 21 tablet 0  . FIBER PO Take 2 capsules by mouth daily.    . fluticasone (FLONASE) 50 MCG/ACT nasal spray Place 2 sprays into both nostrils daily. 16 g 0  . fluticasone furoate-vilanterol (BREO ELLIPTA) 100-25 MCG/INH AEPB Inhale 1 puff into the lungs daily. 180 each 4  . furosemide (LASIX) 20 MG tablet Take 1 tablet (20 mg total) by mouth daily as needed. 30 tablet 0  . gabapentin (NEURONTIN) 100 MG capsule Take  4 capsules tid 360 capsule 1  . isosorbide mononitrate (IMDUR) 30 MG 24 hr tablet Take 30 mg by mouth daily.     Marland Kitchen losartan (COZAAR) 100 MG tablet Take 1 tablet (100 mg total) by mouth daily. 90 tablet 0  . metoprolol tartrate (LOPRESSOR) 100 MG tablet Take 1 tablet (100 mg total) by mouth 2 (two) times daily. 60 tablet 0  . mupirocin ointment (BACTROBAN) 2 % Apply 1 application topically 2 (two) times daily. 22 g 0  . pantoprazole (PROTONIX) 40 MG tablet  Take 1 tablet (40 mg total) by mouth 2 (two) times daily. 180 tablet 0  . sertraline (ZOLOFT) 50 MG tablet Take 1 tablet (50 mg total) by mouth daily. 30 tablet 0  . Tiotropium Bromide Monohydrate (SPIRIVA RESPIMAT) 1.25 MCG/ACT AERS Inhale 2.5 mcg daily into the lungs. 4 g 10  . vitamin B-12 (CYANOCOBALAMIN) 1000 MCG tablet Take 1,000 mcg by mouth daily.      No current facility-administered medications on file prior to visit.     BP 140/82 (BP Location: Left Arm, Patient Position: Sitting, Cuff Size: Normal)   Pulse 69   Temp 98 F (36.7 C) (Oral)   Resp 12   Wt 189 lb 2 oz (85.8 kg)   SpO2 90%   BMI 27.93 kg/m       Objective:   Physical Exam  Constitutional: He is oriented to person, place, and time. He appears well-developed and well-nourished.  Eyes: Pupils are equal, round, and reactive to light. No scleral icterus.  Neck: Neck supple.  Cardiovascular: Normal rate, regular rhythm and intact distal pulses.  Pulses:      Dorsalis pedis pulses are 2+ on the right side, and 2+ on the left side.       Posterior tibial pulses are 2+ on the right side, and 2+ on the left side.  Pulmonary/Chest: Effort normal and breath sounds normal. He has no wheezes. He has no rales.  Abdominal: Soft. Bowel sounds are normal. There is no tenderness.  Musculoskeletal:  Right forefoot exhibits 1+ edema and ecchymosis on  Interior aspect of foot and heel. No damage to skin noted. No point tenderness appreciated. No pain with plantar and dorsal  flexion.   Lymphadenopathy:    He has no cervical adenopathy.  Neurological: He is alert and oriented to person, place, and time. He has normal strength. No sensory deficit. Gait normal.  Skin: Skin is warm and dry. No rash noted.  Skin tear on right lower extremity approximately 2.5 cm x 3 cm without drainage present. No warmth appreciated. Surrounding ecchymosis present that is in various stages of healing   Psychiatric: He has a normal mood and affect. His behavior is normal. Judgment and thought content normal.      Assessment & Plan:  1. Swelling of right foot Exam is reassuring; patient is able to ambulate without pain. Symptoms and exam are most consistent with soft tissue injury vs. Fracture however with edema and ecchymosis present; will obtain an X-ray for further evaluation and treatment. Advised use of rest, elevation and ice for symptom relief and wearing shoes with hard soles.  - DG Foot Complete Right; Future  2. Skin tear of right lower leg without complication, initial encounter Healing without complications. Advised keeping area clear and dry. He can continue use of antibiotic ointment and covering with loose bandage. Further advised follow up if healing does not continue or new symptoms develop such as drainage, warmth, or red streaks from area.  Return precautions advised. Delano Metz, FNP-C

## 2017-10-14 DIAGNOSIS — D500 Iron deficiency anemia secondary to blood loss (chronic): Secondary | ICD-10-CM | POA: Diagnosis not present

## 2017-10-16 ENCOUNTER — Other Ambulatory Visit
Admission: RE | Admit: 2017-10-16 | Discharge: 2017-10-16 | Disposition: A | Discharge status: Home / Self Care | Payer: PPO | Source: Ambulatory Visit | Attending: Gastroenterology | Admitting: Gastroenterology

## 2017-10-16 ENCOUNTER — Encounter: Payer: Self-pay | Admitting: Student

## 2017-10-16 DIAGNOSIS — D500 Iron deficiency anemia secondary to blood loss (chronic): Secondary | ICD-10-CM | POA: Diagnosis not present

## 2017-10-16 DIAGNOSIS — K922 Gastrointestinal hemorrhage, unspecified: Secondary | ICD-10-CM | POA: Insufficient documentation

## 2017-10-16 LAB — CBC
HEMATOCRIT: 33.4 % — AB (ref 40.0–52.0)
Hemoglobin: 11.1 g/dL — ABNORMAL LOW (ref 13.0–18.0)
MCH: 35.6 pg — ABNORMAL HIGH (ref 26.0–34.0)
MCHC: 33.3 g/dL (ref 32.0–36.0)
MCV: 106.7 fL — ABNORMAL HIGH (ref 80.0–100.0)
PLATELETS: 495 10*3/uL — AB (ref 150–440)
RBC: 3.13 MIL/uL — AB (ref 4.40–5.90)
RDW: 20.4 % — ABNORMAL HIGH (ref 11.5–14.5)
WBC: 5.2 10*3/uL (ref 3.8–10.6)

## 2017-10-17 ENCOUNTER — Encounter
Admission: RE | Disposition: A | Discharge status: Home / Self Care | Payer: Self-pay | Source: Ambulatory Visit | Attending: Gastroenterology

## 2017-10-17 ENCOUNTER — Ambulatory Visit
Admission: RE | Admit: 2017-10-17 | Discharge: 2017-10-17 | Disposition: A | Discharge status: Home / Self Care | Payer: PPO | Source: Ambulatory Visit | Attending: Gastroenterology | Admitting: Gastroenterology

## 2017-10-17 ENCOUNTER — Encounter: Payer: Self-pay | Admitting: Emergency Medicine

## 2017-10-17 ENCOUNTER — Ambulatory Visit: Payer: PPO | Admitting: Anesthesiology

## 2017-10-17 DIAGNOSIS — K31819 Angiodysplasia of stomach and duodenum without bleeding: Secondary | ICD-10-CM | POA: Diagnosis not present

## 2017-10-17 DIAGNOSIS — E7800 Pure hypercholesterolemia, unspecified: Secondary | ICD-10-CM | POA: Insufficient documentation

## 2017-10-17 DIAGNOSIS — J441 Chronic obstructive pulmonary disease with (acute) exacerbation: Secondary | ICD-10-CM | POA: Diagnosis not present

## 2017-10-17 DIAGNOSIS — K449 Diaphragmatic hernia without obstruction or gangrene: Secondary | ICD-10-CM | POA: Diagnosis not present

## 2017-10-17 DIAGNOSIS — K222 Esophageal obstruction: Secondary | ICD-10-CM | POA: Diagnosis not present

## 2017-10-17 DIAGNOSIS — F419 Anxiety disorder, unspecified: Secondary | ICD-10-CM | POA: Insufficient documentation

## 2017-10-17 DIAGNOSIS — G4730 Sleep apnea, unspecified: Secondary | ICD-10-CM | POA: Diagnosis not present

## 2017-10-17 DIAGNOSIS — K219 Gastro-esophageal reflux disease without esophagitis: Secondary | ICD-10-CM | POA: Diagnosis not present

## 2017-10-17 DIAGNOSIS — I2510 Atherosclerotic heart disease of native coronary artery without angina pectoris: Secondary | ICD-10-CM | POA: Diagnosis not present

## 2017-10-17 DIAGNOSIS — K766 Portal hypertension: Secondary | ICD-10-CM | POA: Insufficient documentation

## 2017-10-17 DIAGNOSIS — D509 Iron deficiency anemia, unspecified: Secondary | ICD-10-CM | POA: Diagnosis not present

## 2017-10-17 DIAGNOSIS — Z79899 Other long term (current) drug therapy: Secondary | ICD-10-CM | POA: Diagnosis not present

## 2017-10-17 DIAGNOSIS — I10 Essential (primary) hypertension: Secondary | ICD-10-CM | POA: Diagnosis not present

## 2017-10-17 DIAGNOSIS — E785 Hyperlipidemia, unspecified: Secondary | ICD-10-CM | POA: Diagnosis not present

## 2017-10-17 DIAGNOSIS — Z7902 Long term (current) use of antithrombotics/antiplatelets: Secondary | ICD-10-CM | POA: Insufficient documentation

## 2017-10-17 DIAGNOSIS — F329 Major depressive disorder, single episode, unspecified: Secondary | ICD-10-CM | POA: Diagnosis not present

## 2017-10-17 DIAGNOSIS — K3189 Other diseases of stomach and duodenum: Secondary | ICD-10-CM | POA: Insufficient documentation

## 2017-10-17 DIAGNOSIS — J449 Chronic obstructive pulmonary disease, unspecified: Secondary | ICD-10-CM | POA: Diagnosis not present

## 2017-10-17 HISTORY — DX: Anxiety disorder, unspecified: F41.9

## 2017-10-17 HISTORY — PX: ESOPHAGOGASTRODUODENOSCOPY (EGD) WITH PROPOFOL: SHX5813

## 2017-10-17 HISTORY — DX: Hypo-osmolality and hyponatremia: E87.1

## 2017-10-17 HISTORY — DX: Noninfective gastroenteritis and colitis, unspecified: K52.9

## 2017-10-17 HISTORY — DX: Personal history of other diseases of the musculoskeletal system and connective tissue: Z87.39

## 2017-10-17 HISTORY — DX: Hyperlipidemia, unspecified: E78.5

## 2017-10-17 HISTORY — DX: Collapsed vertebra, not elsewhere classified, site unspecified, initial encounter for fracture: M48.50XA

## 2017-10-17 SURGERY — ESOPHAGOGASTRODUODENOSCOPY (EGD) WITH PROPOFOL
Anesthesia: General

## 2017-10-17 MED ORDER — SODIUM CHLORIDE 0.9 % IV SOLN
INTRAVENOUS | Status: DC
  Administered 2017-10-17: 1000 mL via INTRAVENOUS

## 2017-10-17 MED ORDER — SODIUM CHLORIDE 0.9 % IV SOLN: INTRAVENOUS | Status: DC

## 2017-10-17 MED ORDER — PROPOFOL 10 MG/ML IV BOLUS
INTRAVENOUS | Status: AC
  Filled 2017-10-17: qty 20

## 2017-10-17 MED ORDER — MIDAZOLAM HCL 2 MG/2ML IJ SOLN
INTRAMUSCULAR | Status: DC | PRN
  Administered 2017-10-17 (×2): 1 mg via INTRAVENOUS

## 2017-10-17 MED ORDER — PROPOFOL 500 MG/50ML IV EMUL
INTRAVENOUS | Status: AC
  Filled 2017-10-17: qty 50

## 2017-10-17 MED ORDER — PROPOFOL 500 MG/50ML IV EMUL
INTRAVENOUS | Status: DC | PRN
  Administered 2017-10-17: 100 ug/kg/min via INTRAVENOUS

## 2017-10-17 MED ORDER — MIDAZOLAM HCL 2 MG/2ML IJ SOLN
INTRAMUSCULAR | Status: AC
  Filled 2017-10-17: qty 2

## 2017-10-17 MED ORDER — PROPOFOL 10 MG/ML IV BOLUS
INTRAVENOUS | Status: DC | PRN
  Administered 2017-10-17 (×2): 30 mg via INTRAVENOUS
  Administered 2017-10-17: 50 mg via INTRAVENOUS
  Administered 2017-10-17: 20 mg via INTRAVENOUS

## 2017-10-17 NOTE — H&P (Signed)
Outpatient short stay form Pre-procedure 10/17/2017 3:05 PM Troy Sails MD  Primary Physician: Dr. Einar Pheasant, Dr. Aris [place]  Reason for visit: EGD  History of present illness: Patient is a 77 year old male who was seen initially in the GI outpatient clinic for iron deficiency anemia.  He had had a EGD and colonoscopy this past summer admission with GI bleeding and anemia at that time.  Were normal with the exception of a small hiatal hernia Schatzki ring mild gastritis duodenal diverticulum.  Colonoscopy showed some diverticulosis and internal hemorrhoids.  When he presented to the GI outpatient clinic he underwent a video capsule endoscopy after small bowel series.  The small bowel series was ring a diver duodenal diverticulum but otherwise unremarkable.  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  There is also the finding of possible pyloric channel ulcer.  It is of note patient takes Eliquis.  His last dose of Eliquis was over 48 hours ago.  He takes no other aspirin or blood thinning agent.  He is presenting today for EGD.  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  Has no symptoms of obstruction.  Patient denies any nausea vomiting abdominal pain changes of bowel habits black stools or hematemesis.  Has no previous history of ulcer.    Current Facility-Administered Medications:  .  0.9 %  sodium chloride infusion, , Intravenous, Continuous, Troy Sails, MD, Last Rate: 20 mL/hr at 10/17/17 1455, 1,000 mL at 10/17/17 1455 .  0.9 %  sodium chloride infusion, , Intravenous, Continuous, Troy Sails, MD  Medications Prior to Admission  Medication Sig Dispense Refill Last Dose  .  acetaminophen (TYLENOL) 500 MG tablet Take 500-1,000 mg by mouth 3 (three) times daily as needed for moderate pain or headache.   10/17/2017 at Unknown time  . alendronate (FOSAMAX) 70 MG tablet Take 70 mg by mouth once a week. Take with a full glass of water on an empty stomach. Takes on Sundays   Past Week at Unknown time  . amLODipine (NORVASC) 10 MG tablet Take 1 tablet (5 mg total) by mouth daily. 90 tablet 0 10/17/2017 at Unknown time  . budesonide (ENTOCORT EC) 3 MG 24 hr capsule Take 9 mg by mouth daily as needed (colitis flare).    Past Month at Unknown time  . ELIQUIS 5 MG TABS tablet Take 5 mg by mouth 2 (two) times daily.    Past Week at Unknown time  . FIBER PO Take 2 capsules by mouth daily.   Past Week at Unknown time  . fluticasone (FLONASE) 50 MCG/ACT nasal spray Place 2 sprays into both nostrils daily. 16 g 0 10/16/2017 at Unknown time  . fluticasone furoate-vilanterol (BREO ELLIPTA) 100-25 MCG/INH AEPB Inhale 1 puff into the lungs daily. 180 each 4 10/17/2017 at Unknown time  . furosemide (LASIX) 20 MG tablet Take 1 tablet (20 mg total) by mouth daily as needed. 30 tablet 0 Past Week at Unknown time  . gabapentin (NEURONTIN) 100 MG capsule Take 4 capsules tid 360 capsule 1 10/17/2017 at Unknown time  . isosorbide mononitrate (IMDUR) 30 MG 24 hr tablet Take 30 mg by mouth daily.    10/17/2017 at Unknown time  .  losartan (COZAAR) 100 MG tablet Take 1 tablet (100 mg total) by mouth daily. 90 tablet 0 10/17/2017 at Unknown time  . metoprolol tartrate (LOPRESSOR) 100 MG tablet Take 1 tablet (100 mg total) by mouth 2 (two) times daily. 60 tablet 0 10/17/2017 at Unknown time  . mupirocin ointment (BACTROBAN) 2 % Apply 1 application topically 2 (two) times daily. 22 g 0 10/17/2017 at Unknown time  . ondansetron (ZOFRAN) 8 MG tablet Take by mouth every 8 (eight) hours as needed for nausea or vomiting.   10/17/2017 at Unknown time  . pantoprazole (PROTONIX) 40 MG tablet Take 1 tablet (40 mg total) by mouth 2  (two) times daily. 180 tablet 0 10/17/2017 at Unknown time  . promethazine (PHENERGAN) 12.5 MG tablet Take 12.5 mg by mouth every 6 (six) hours as needed for nausea or vomiting.   Past Month at Unknown time  . sertraline (ZOLOFT) 50 MG tablet Take 1 tablet (50 mg total) by mouth daily. 30 tablet 0 10/17/2017 at Unknown time  . Tiotropium Bromide Monohydrate (SPIRIVA RESPIMAT) 1.25 MCG/ACT AERS Inhale 2.5 mcg daily into the lungs. 4 g 10 10/17/2017 at Unknown time  . vitamin B-12 (CYANOCOBALAMIN) 1000 MCG tablet Take 1,000 mcg by mouth daily.    10/17/2017 at Unknown time  . albuterol (PROVENTIL HFA;VENTOLIN HFA) 108 (90 Base) MCG/ACT inhaler Inhale 2 puffs into the lungs every 6 (six) hours as needed for wheezing or shortness of breath.   Taking  . famciclovir (FAMVIR) 500 MG tablet Take 1 tablet (500 mg total) 3 (three) times daily by mouth. (Patient not taking: Reported on 10/17/2017) 21 tablet 0 Not Taking at Unknown time     Allergies  Allergen Reactions  . Ivp Dye [Iodinated Diagnostic Agents]     Contraindication secondary to IGMgammopathy/Waldren's syndrome       Past Medical History:  Diagnosis Date  . Adrenal gland anomaly    enlargement  . Anxiety   . CAD (coronary artery disease)   . Carpal tunnel syndrome   . Chronic hyponatremia   . Colitis   . Colonic polyp   . COPD (chronic obstructive pulmonary disease) ([place])   . Degenerative disc disease, lumbar   . Depression   . Diverticulosis   . Dyspnea   . GERD (gastroesophageal reflux disease)   . H/O degenerative disc disease   . Hypercholesterolemia   . Hyperkalemia   . Hyperlipidemia   . Hypertension   . Irritable bowel syndrome   . Monoclonal gammopathy   . Monoclonal gammopathy   . Neuropathy   . Personal history of tobacco use, presenting hazards to health 08/17/2015  . Sleep apnea   . Vertebral compression fracture ([facility])     Review of systems:      Physical Exam    Heart and lungs: Regular rate and rhythm  without rub or gallop, lungs are bilaterally clear.    HEENT: Normocephalic atraumatic eyes are anicteric    Other:    Pertinant exam for procedure: Soft nontender nondistended bowel sounds positive normoactive.    Planned proceedures: EGD and indicated procedures.  I discussed with the patient that we may need to cauterize any actively bleeding lesions. I have discussed the risks benefits and complications of procedures to include not limited to bleeding, infection, perforation and the risk of sedation and the patient wishes to proceed.    Troy Sails, MD Gastroenterology 10/17/2017  3:05 PM

## 2017-10-17 NOTE — Anesthesia Post-op Follow-up Note (Signed)
Anesthesia QCDR form completed.        

## 2017-10-17 NOTE — Op Note (Signed)
[facility] Gastroenterology Patient Name: Troy Lopez Procedure Date: 10/17/2017 2:53 PM MRN: [id number] Account #: [id number] Date of Birth: 1941-05-10 Admit Type: Outpatient Age: 77 Room: [facility] ENDO ROOM 3 Gender: Male Note Status: Finalized Procedure:            Upper GI endoscopy Indications:          Arteriovenous malformation in the stomach Providers:            Lollie Sails, MD Referring MD:         Einar Pheasant, MD (Referring MD) Medicines:            Monitored Anesthesia Care Complications:        No immediate complications. Procedure:            Pre-Anesthesia Assessment:                       - ASA Grade Assessment: III - A patient with severe                        systemic disease.                       After obtaining informed consent, the endoscope was                        passed under direct vision. Throughout the procedure,                        the patient's blood pressure, pulse, and oxygen                        saturations were monitored continuously. The Endoscope                        was introduced through the mouth, and advanced to the                        second part of duodenum. The upper GI endoscopy was                        accomplished without difficulty. The patient tolerated                        the procedure well. Findings:      A small hiatal hernia was present.      A non-obstructing Schatzki ring (acquired) was found at the       gastroesophageal junction.      Two 2 to 3 mm no active bleeding angiodysplastic lesions were found in       the gastric body. Both were very superficial, with typical frond-like       appearance. Fulguration to ablate the lesion to prevent bleeding by       argon plasma at 0.5 liters/minute and 20 watts was successful. To       prevent bleeding post-maneuver, one hemostatic clip was successfully       placed (MR conditional) on the larger of the two lesion. There was no   bleeding at the end of the maneuver. Two clips were used, the first clip       di not deploy well and was loose in the lumen.  Mild portal hypertensive gastropathy was found in the gastric body.      Two less than 1 mm, diminutive, angioectasias without bleeding were       found in the duodenal bulb and in the second portion of the duodenum.      The cardia and gastric fundus were normal on retroflexion otherwise.       There was no evidence of ulcer disease. Impression:           - Small hiatal hernia.                       - Non-obstructing Schatzki ring.                       - Two non-bleeding angiodysplastic lesions in the                        stomach. Treated with argon plasma coagulation (APC).                        Clip (MR conditional) was placed. Hemostasis was very                        good at both treatment sites.                       - Portal hypertensive gastropathy.                       - Two non-bleeding angioectasias in the duodenum.                       - No specimens collected. Recommendation:       - Observe patient in GI recovery unit for possible                        discharge same day.                       - Use Protonix (pantoprazole) 40 mg PO BID.                       - Use sucralfate suspension 1 gram PO QID for 10 days.                       - Check h. pylori stool antigen when clinically                        feasible. at appointment to be scheduled.                       - Return to GI clinic in 5 days.                       - Clear liquid diet for 2 days.                       - Full liquid diet for 2 days, then advance as                        tolerated to soft diet for 1 week. Procedure Code(s):    ---  Professional ---                       [phone], Esophagogastroduodenoscopy, flexible, transoral;                        with control of bleeding, any method Diagnosis Code(s):    --- Professional ---                       K44.9,  Diaphragmatic hernia without obstruction or                        gangrene                       K22.2, Esophageal obstruction                       K31.819, Angiodysplasia of stomach and duodenum without                        bleeding                       K76.6, Portal hypertension                       K31.89, Other diseases of stomach and duodenum                       Q27.33, Arteriovenous malformation of digestive system                        vessel CPT copyright 2016 [employer]. All rights reserved. The codes documented in this report are preliminary and upon coder review may  be revised to meet current compliance requirements. Lollie Sails, MD 10/17/2017 4:27:37 PM This report has been signed electronically. Number of Addenda: 0 Note Initiated On: 10/17/2017 2:53 PM      [facility]

## 2017-10-17 NOTE — Anesthesia Preprocedure Evaluation (Signed)
Anesthesia Evaluation  Patient identified by MRN, date of birth, ID band Patient awake    Reviewed: Allergy & Precautions, H&P , NPO status , Patient's Chart, lab work & pertinent test results, reviewed documented beta blocker date and time   Airway Mallampati: II   Neck ROM: full    Dental  (+) Poor Dentition   Pulmonary neg pulmonary ROS, shortness of breath and with exertion, sleep apnea and Continuous Positive Airway Pressure Ventilation , COPD,  COPD inhaler, former smoker,    Pulmonary exam normal        Cardiovascular Exercise Tolerance: Poor hypertension, (-) angina+ CAD and + Peripheral Vascular Disease  negative cardio ROS Normal cardiovascular exam Rhythm:regular Rate:Normal  Hx of a flutter   Neuro/Psych PSYCHIATRIC DISORDERS Anxiety Depression  Neuromuscular disease negative neurological ROS  negative psych ROS   GI/Hepatic negative GI ROS, Neg liver ROS, GERD  ,  Endo/Other  negative endocrine ROS  Renal/GU negative Renal ROS  negative genitourinary   Musculoskeletal   Abdominal   Peds  Hematology negative hematology ROS (+) anemia ,   Anesthesia Other Findings Past Medical History: 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 No date: Anxiety No date: CAD (coronary artery disease) No date: Carpal tunnel syndrome No date: Chronic hyponatremia No date: Colitis No date: Colonic polyp No date: COPD (chronic obstructive pulmonary disease) (HCC) No date: Degenerative disc disease, lumbar No date: Depression No date: Diverticulosis No date: Dyspnea No date: GERD (gastroesophageal reflux disease) No date: H/O degenerative disc disease No date: Hypercholesterolemia No date: Hyperkalemia No date: Hyperlipidemia No date: Hypertension No date: Irritable bowel syndrome 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 No date: Neuropathy 08/17/2015: Personal history of  tobacco use, presenting hazards to  health No date: Sleep apnea No date: Vertebral compression fracture [facility]) Past Surgical History: 1989: BUNIONECTOMY 2011: CARPAL TUNNEL RELEASE; Left     Comment:  ulnar nerve sub muscular at elbow 09/07: CHOLECYSTECTOMY 03/05/2017: COLONOSCOPY WITH PROPOFOL; N/A     Comment:  Procedure: COLONOSCOPY WITH PROPOFOL;  Surgeon: Lucilla Lame, MD;  Location: [facility] ENDOSCOPY;  Service:               Endoscopy;  Laterality: N/A; 11/15/2015: ELECTROPHYSIOLOGIC STUDY; N/A     Comment:  Procedure: CARDIOVERSION;  Surgeon: Isaias Cowman,              MD;  Location: [facility] ORS;  Service: Cardiovascular;                Laterality: N/A; 03/05/2017: ESOPHAGOGASTRODUODENOSCOPY (EGD) WITH PROPOFOL; N/A     Comment:  Procedure: ESOPHAGOGASTRODUODENOSCOPY (EGD) WITH               PROPOFOL;  Surgeon: Lucilla Lame, MD;  Location: [facility]               ENDOSCOPY;  Service: Endoscopy;  Laterality: N/A; 2010: EYE SURGERY; Bilateral     Comment:  cataract No date: HEMORRHOID SURGERY 11/04/2016: KYPHOPLASTY; N/A     Comment:  Procedure: KYPHOPLASTY T 12;  Surgeon: Hessie Knows, MD;              Location: [facility] ORS;  Service: Orthopedics;  Laterality:               N/A;   Reproductive/Obstetrics negative OB ROS  Anesthesia Physical Anesthesia Plan  ASA: III  Anesthesia Plan: General   Post-op Pain Management:    Induction:   PONV Risk Score and Plan:   Airway Management Planned:   Additional Equipment:   Intra-op Plan:   Post-operative Plan:   Informed Consent: I have reviewed the patients History and Physical, chart, labs and discussed the procedure including the risks, benefits and alternatives for the proposed anesthesia with the patient or authorized representative who has indicated his/her understanding and acceptance.   Dental Advisory Given  Plan Discussed with: CRNA  Anesthesia Plan  Comments:         Anesthesia Quick Evaluation

## 2017-10-17 NOTE — Transfer of Care (Signed)
Immediate Anesthesia Transfer of Care Note  Patient: Troy Lopez  Procedure(s) Performed: ESOPHAGOGASTRODUODENOSCOPY (EGD) WITH PROPOFOL (N/A )  Patient Location: PACU and Endoscopy Unit  Anesthesia Type:General  Level of Consciousness: sedated  Airway & Oxygen Therapy: Patient Spontanous Breathing  Post-op Assessment: Report given to RN  Post vital signs: stable  Last Vitals:  Vitals:   10/17/17 1430 10/17/17 1611  BP: (!) 129/99 127/77  Pulse: 61 (!) 58  Resp: 16 16  Temp: 36.7 C 36.6 C  SpO2: 100% 96%    Last Pain:  Vitals:   10/17/17 1611  TempSrc: Tympanic         Complications: No apparent anesthesia complications

## 2017-10-18 ENCOUNTER — Encounter: Payer: Self-pay | Admitting: Gastroenterology

## 2017-10-22 DIAGNOSIS — D509 Iron deficiency anemia, unspecified: Secondary | ICD-10-CM | POA: Diagnosis not present

## 2017-10-24 ENCOUNTER — Other Ambulatory Visit: Payer: Self-pay | Admitting: Internal Medicine

## 2017-10-27 ENCOUNTER — Ambulatory Visit (INDEPENDENT_AMBULATORY_CARE_PROVIDER_SITE_OTHER): Payer: PPO

## 2017-10-27 ENCOUNTER — Encounter: Payer: Self-pay | Admitting: Internal Medicine

## 2017-10-27 ENCOUNTER — Ambulatory Visit: Payer: PPO | Admitting: Family

## 2017-10-27 ENCOUNTER — Ambulatory Visit (INDEPENDENT_AMBULATORY_CARE_PROVIDER_SITE_OTHER): Payer: PPO | Admitting: Internal Medicine

## 2017-10-27 VITALS — BP 134/64 | HR 72 | Temp 99.0°F | Ht 71.0 in | Wt 185.0 lb

## 2017-10-27 DIAGNOSIS — S3991XA Unspecified injury of abdomen, initial encounter: Secondary | ICD-10-CM | POA: Diagnosis not present

## 2017-10-27 DIAGNOSIS — S299XXA Unspecified injury of thorax, initial encounter: Secondary | ICD-10-CM | POA: Diagnosis not present

## 2017-10-27 DIAGNOSIS — R0781 Pleurodynia: Secondary | ICD-10-CM | POA: Diagnosis not present

## 2017-10-27 DIAGNOSIS — J449 Chronic obstructive pulmonary disease, unspecified: Secondary | ICD-10-CM

## 2017-10-27 DIAGNOSIS — R55 Syncope and collapse: Secondary | ICD-10-CM

## 2017-10-27 DIAGNOSIS — W19XXXA Unspecified fall, initial encounter: Secondary | ICD-10-CM

## 2017-10-27 DIAGNOSIS — E871 Hypo-osmolality and hyponatremia: Secondary | ICD-10-CM | POA: Diagnosis not present

## 2017-10-27 DIAGNOSIS — K5900 Constipation, unspecified: Secondary | ICD-10-CM

## 2017-10-27 DIAGNOSIS — S5001XA Contusion of right elbow, initial encounter: Secondary | ICD-10-CM | POA: Diagnosis not present

## 2017-10-27 DIAGNOSIS — R079 Chest pain, unspecified: Secondary | ICD-10-CM | POA: Diagnosis not present

## 2017-10-27 MED ORDER — LACTULOSE 10 GM/15ML PO SOLN: 20.0000 g | Freq: Two times a day (BID) | ORAL | 1 refills | Status: AC | PRN

## 2017-10-27 MED ORDER — TRAMADOL HCL 50 MG PO TABS: 50.0000 mg | ORAL_TABLET | Freq: Two times a day (BID) | ORAL | 0 refills | Status: DC | PRN

## 2017-10-27 NOTE — Progress Notes (Signed)
Pre visit review using our clinic review tool, if applicable. No additional management support is needed unless otherwise documented below in the visit note. 

## 2017-10-27 NOTE — Anesthesia Postprocedure Evaluation (Signed)
Anesthesia Post Note  Patient: Troy Lopez  Procedure(s) Performed: ESOPHAGOGASTRODUODENOSCOPY (EGD) WITH PROPOFOL (N/A )  Patient location during evaluation: Endoscopy Anesthesia Type: General Level of consciousness: awake and alert Pain management: pain level controlled Vital Signs Assessment: post-procedure vital signs reviewed and stable Respiratory status: spontaneous breathing, nonlabored ventilation, respiratory function stable and patient connected to nasal cannula oxygen Cardiovascular status: blood pressure returned to baseline and stable Postop Assessment: no apparent nausea or vomiting Anesthetic complications: no     Last Vitals:  Vitals:   10/17/17 1631 10/17/17 1641  BP: (!) 156/83 (!) 162/86  Pulse: (!) 55 (!) 56  Resp: 13 15  Temp:    SpO2: 99% 99%    Last Pain:  Vitals:   10/17/17 1611  TempSrc: Tympanic                 [name]

## 2017-10-27 NOTE — Patient Instructions (Addendum)
Try Miralax 2x per day today  Fiber tablet or metamucil  Increased water intake  Warm prune juice  Lactulose  F/u in 1 week with PCP   Syncope Syncope is when you temporarily lose consciousness. Syncope may also be called fainting or passing out. It is caused by a sudden decrease in blood flow to the brain. Even though most causes of syncope are not dangerous, syncope can be a sign of a serious medical problem. Signs that you may be about to faint include:  Feeling dizzy or light-headed.  Feeling nauseous.  Seeing all white or all black in your field of vision.  Having cold, clammy skin.  If you fainted, get medical help right away.Call your local emergency services (911 in the [place] Do not drive yourself to the hospital. Follow these instructions at home: Pay attention to any changes in your symptoms. Take these actions to help with your condition:  Have someone stay with you until you feel stable.  Do not drive, use machinery, or play sports until your health care provider says it is okay.  Keep all follow-up visits as told by your health care provider. This is important.  If you start to feel like you might faint, lie down right away and raise (elevate) your feet above the level of your heart. Breathe deeply and steadily. Wait until all of the symptoms have passed.  Drink enough fluid to keep your urine clear or pale yellow.  If you are taking blood pressure or heart medicine, get up slowly and take several minutes to sit and then stand. This can reduce dizziness.  Take over-the-counter and prescription medicines only as told by your health care provider.  Get help right away if:  You have a severe headache.  You have unusual pain in your chest, abdomen, or back.  You are bleeding from your mouth or rectum, or you have black or tarry stool.  You have a very fast or irregular heartbeat (palpitations).  You have pain with breathing.  You faint once or  repeatedly.  You have a seizure.  You are confused.  You have trouble walking.  You have severe weakness.  You have vision problems. These symptoms may represent a serious problem that is an emergency. Do not wait to see if your symptoms will go away. Get medical help right away. Call your local emergency services (911 in the [place] Do not drive yourself to the hospital. This information is not intended to replace advice given to you by your health care provider. Make sure you discuss any questions you have with your health care provider. Document Released: 08/05/2005 Document Revised: 01/11/2016 Document Reviewed: 04/19/2015 Elsevier Interactive Patient Education  2018 [employer].    Constipation, Adult Constipation is when a person:  Poops (has a bowel movement) fewer times in a week than normal.  Has a hard time pooping.  Has poop that is dry, hard, or bigger than normal.  Follow these instructions at home: Eating and drinking   Eat foods that have a lot of fiber, such as: ? Fresh fruits and vegetables. ? Whole grains. ? Beans.  Eat less of foods that are high in fat, low in fiber, or overly processed, such as: ? [place] fries. ? Hamburgers. ? Cookies. ? Candy. ? Soda.  Drink enough fluid to keep your pee (urine) clear or pale yellow. General instructions  Exercise regularly or as told by your doctor.  Go to the restroom when you feel like you need  to poop. Do not hold it in.  Take over-the-counter and prescription medicines only as told by your doctor. These include any fiber supplements.  Do pelvic floor retraining exercises, such as: ? Doing deep breathing while relaxing your lower belly (abdomen). ? Relaxing your pelvic floor while pooping.  Watch your condition for any changes.  Keep all follow-up visits as told by your doctor. This is important. Contact a doctor if:  You have pain that gets worse.  You have a fever.  You have not pooped for  4 days.  You throw up (vomit).  You are not hungry.  You lose weight.  You are bleeding from the anus.  You have thin, pencil-like poop (stool). Get help right away if:  You have a fever, and your symptoms suddenly get worse.  You leak poop or have blood in your poop.  Your belly feels hard or bigger than normal (is bloated).  You have very bad belly pain.  You feel dizzy or you faint. This information is not intended to replace advice given to you by your health care provider. Make sure you discuss any questions you have with your health care provider. Document Released: 01/22/2008 Document Revised: 02/23/2016 Document Reviewed: 01/24/2016 Elsevier Interactive Patient Education  2018 [employer].

## 2017-10-28 LAB — COMPREHENSIVE METABOLIC PANEL
ALT: 10 U/L (ref 0–53)
AST: 14 U/L (ref 0–37)
Albumin: 3.6 g/dL (ref 3.5–5.2)
Alkaline Phosphatase: 50 U/L (ref 39–117)
BILIRUBIN TOTAL: 0.5 mg/dL (ref 0.2–1.2)
BUN: 11 mg/dL (ref 6–23)
CHLORIDE: 94 meq/L — AB (ref 96–112)
CO2: 24 meq/L (ref 19–32)
CREATININE: 1.05 mg/dL (ref 0.40–1.50)
Calcium: 9.9 mg/dL (ref 8.4–10.5)
GFR: 73.06 mL/min (ref >60.00)
GLUCOSE: 99 mg/dL (ref 70–99)
Potassium: 4.9 mEq/L (ref 3.5–5.1)
Sodium: 126 mEq/L — ABNORMAL LOW (ref 135–145)
Total Protein: 8.3 g/dL (ref 6.0–8.3)

## 2017-10-28 LAB — CBC WITH DIFFERENTIAL/PLATELET
BASOS PCT: 1.8 % (ref 0.0–3.0)
Basophils Absolute: 0.1 10*3/uL (ref 0.0–0.1)
Eosinophils Absolute: 0 10*3/uL (ref 0.0–0.7)
Eosinophils Relative: 0.1 % (ref 0.0–5.0)
HEMATOCRIT: 33.8 % — AB (ref 39.0–52.0)
HEMOGLOBIN: 11.6 g/dL — AB (ref 13.0–17.0)
Lymphocytes Relative: 19.8 % (ref 12.0–46.0)
Lymphs Abs: 1.5 10*3/uL (ref 0.7–4.0)
MCHC: 34.3 g/dL (ref 30.0–36.0)
MCV: 105.9 fl — ABNORMAL HIGH (ref 78.0–100.0)
MONOS PCT: 10.5 % (ref 3.0–12.0)
Monocytes Absolute: 0.8 10*3/uL (ref 0.1–1.0)
Neutro Abs: 5.2 10*3/uL (ref 1.4–7.7)
Neutrophils Relative %: 67.8 % (ref 43.0–77.0)
Platelets: 371 10*3/uL (ref 150.0–400.0)
RBC: 3.19 Mil/uL — AB (ref 4.22–5.81)
RDW: 18 % — ABNORMAL HIGH (ref 11.5–15.5)
WBC: 7.7 10*3/uL (ref 4.0–10.5)

## 2017-10-28 LAB — MICROALBUMIN / CREATININE URINE RATIO
CREATININE, U: 204.4 mg/dL
MICROALB/CREAT RATIO: 0.9 mg/g (ref 0.0–30.0)
Microalb, Ur: 1.9 mg/dL (ref 0.0–1.9)

## 2017-10-28 LAB — SODIUM, URINE, RANDOM: SODIUM UR: 29 mmol/L (ref 28–272)

## 2017-10-28 LAB — OSMOLALITY, URINE: Osmolality, Ur: 453 mOsm/kg (ref 50–1200)

## 2017-10-28 LAB — OSMOLALITY: Osmolality: 270 mOsm/kg — ABNORMAL LOW (ref 278–305)

## 2017-10-29 ENCOUNTER — Encounter: Payer: Self-pay | Admitting: Internal Medicine

## 2017-10-29 ENCOUNTER — Ambulatory Visit: Payer: PPO

## 2017-10-29 DIAGNOSIS — K5900 Constipation, unspecified: Secondary | ICD-10-CM | POA: Insufficient documentation

## 2017-10-29 DIAGNOSIS — R0781 Pleurodynia: Secondary | ICD-10-CM | POA: Insufficient documentation

## 2017-10-29 DIAGNOSIS — W19XXXA Unspecified fall, initial encounter: Secondary | ICD-10-CM | POA: Insufficient documentation

## 2017-10-29 NOTE — Progress Notes (Addendum)
Chief Complaint  Patient presents with  . Follow-up   Follow up 1. 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 He has notices since being on carafate he has been constipated w/o stool since Wednesday of last week though he has tried Miralax x 2.  2. Fall ? LOC he reports he does not remember fall but lost his balance unknown if he hit his head but his right elbow ended up in right side and c/w rib fracture.  Right elbow is bruised.  3. Reviewed chart h/o low sodium reviewed CT chest 03/2017  Emphysematous and bronchitic changes consistent with COPD with progressive interstitial lung disease changes at the upper lobes Bilaterally. Calcified granuloma right upper lung   Small RIGHT adrenal adenoma.  Extensive atherosclerotic calcifications including aorta, proximal great vessels and coronary arteries with mild aneurysmal dilatation of the ascending thoracic aorta 4.0 cm greatest size, recommendation below.  Recommend annual imaging followup by CTA or MRA. This recommendation follows 2010 ACCF/AHA/AATS/ACR/ASA/SCA/SCAI/SIR/STS/SVM Guidelines for the Diagnosis and Management of Patients with Thoracic Aortic Disease. Circulation. 2010; 121: B510-C585  Aortic Atherosclerosis (ICD10-I70.0) and Emphysema (ICD10-J43.9).  Aortic aneurysm NOS (ICD10-I71.9).     Review of Systems  Constitutional: Negative for weight loss.  HENT: Negative for hearing loss.   Eyes: Negative for blurred vision.  Respiratory: Negative for shortness of breath.   Cardiovascular: Negative for chest pain.  Gastrointestinal: Positive for constipation.  Musculoskeletal: Positive for falls.       +right rib pain   Skin: Negative for rash.  Neurological:       ?LOC  Endo/Heme/Allergies: Bruises/bleeds easily.   +bruise to right elbow    Past Medical History:  Diagnosis Date  . Adrenal gland anomaly    enlargement  . Anxiety   . CAD (coronary artery disease)   . Carpal tunnel syndrome   . Chronic hyponatremia   . Colitis   . Colonic polyp   . COPD (chronic obstructive pulmonary disease) ([place])   . Degenerative disc disease, lumbar   . Depression   . Diverticulosis   . Dyspnea   . GERD (gastroesophageal reflux disease)   . H/O degenerative disc disease   . Hypercholesterolemia   . Hyperkalemia   . Hyperlipidemia   . Hypertension   . Irritable bowel syndrome   . Monoclonal gammopathy   . Monoclonal gammopathy   . Neuropathy   . Personal history of tobacco use, presenting hazards to health 08/17/2015  . Sleep apnea   . Vertebral compression fracture [facility])    Past Surgical History:  Procedure Laterality Date  . BUNIONECTOMY  1989  . CARPAL TUNNEL RELEASE Left 2011   ulnar nerve sub muscular at elbow  . CHOLECYSTECTOMY  09/07  . COLONOSCOPY WITH PROPOFOL N/A 03/05/2017   Procedure: COLONOSCOPY WITH PROPOFOL;  Surgeon: Lucilla Lame, MD;  Location: [facility] ENDOSCOPY;  Service: Endoscopy;  Laterality: N/A;  . ELECTROPHYSIOLOGIC STUDY N/A 11/15/2015   Procedure: CARDIOVERSION;  Surgeon: Isaias Cowman, MD;  Location: [facility] ORS;  Service: Cardiovascular;  Laterality: N/A;  . ESOPHAGOGASTRODUODENOSCOPY (EGD) WITH PROPOFOL N/A 03/05/2017   Procedure: ESOPHAGOGASTRODUODENOSCOPY (EGD) WITH PROPOFOL;  Surgeon: Lucilla Lame, MD;  Location: [facility] ENDOSCOPY;  Service: Endoscopy;  Laterality: N/A;  . ESOPHAGOGASTRODUODENOSCOPY (EGD) WITH PROPOFOL N/A 10/17/2017   Procedure: ESOPHAGOGASTRODUODENOSCOPY (EGD) WITH PROPOFOL;  Surgeon: Lollie Sails, MD;  Location: [facility] ENDOSCOPY;  Service: Endoscopy;  Laterality: N/A;  . EYE SURGERY Bilateral 2010   cataract  . HEMORRHOID SURGERY    . KYPHOPLASTY N/A 11/04/2016   Procedure: KYPHOPLASTY T 12;  Surgeon: Hessie Knows, MD;  Location: [facility] ORS;  Service:  Orthopedics;  Laterality: N/A;   Family History  Problem Relation Age of Onset  . Stroke Mother   . Heart disease Father        MI - 75   . Colon cancer Neg Hx   . Prostate cancer Neg Hx    Social History   Socioeconomic History  . Marital status: Married    Spouse name: Not on file  . Number of children: 3  . Years of education: Not on file  . Highest education level: Not on file  Social Needs  . Financial resource strain: Not on file  . Food insecurity - worry: Not on file  . Food insecurity - inability: Not on file  . Transportation needs - medical: Not on file  . Transportation needs - non-medical: Not on file  Occupational History  . Not on file  Tobacco Use  . Smoking status: Former Smoker    Types: Cigarettes    Last attempt to quit: 11/01/2015    Years since quitting: 1.9  . Smokeless tobacco: Never Used  . Tobacco comment: about 2 cigarettes per day, trying to quit  Substance and Sexual Activity  . Alcohol use: Yes    Alcohol/week: 25.2 oz    Types: 42 Cans of beer per week    Comment: occas  . Drug use: No  . Sexual activity: Not on file  Other Topics Concern  . Not on file  Social History Narrative  . Not on file   Current Meds  Medication Sig  . acetaminophen (TYLENOL) 500 MG tablet Take 500-1,000 mg by mouth 3 (three) times daily as needed for moderate pain or headache.  . albuterol (PROVENTIL HFA;VENTOLIN HFA) 108 (90 Base) MCG/ACT inhaler Inhale 2 puffs into the lungs every 6 (six) hours as needed for wheezing or shortness of breath.  Marland Kitchen alendronate (FOSAMAX) 70 MG tablet Take 70 mg by mouth once a week. Take with a full glass of water on an empty stomach. Takes on Sundays  . amLODipine (NORVASC) 10 MG tablet Take 1 tablet (5 mg total) by mouth daily.  . budesonide (ENTOCORT EC) 3 MG 24 hr capsule Take 9 mg by mouth daily as needed (colitis flare).   Marland Kitchen ELIQUIS 5 MG TABS tablet Take 5 mg by mouth 2 (two) times daily.   . famciclovir (FAMVIR) 500 MG  tablet Take 1 tablet (500 mg total) 3 (three) times daily by mouth.  . FIBER PO Take 2 capsules by mouth daily.  . fluticasone (FLONASE) 50 MCG/ACT nasal spray Place 2 sprays into both nostrils daily.  . fluticasone furoate-vilanterol (BREO ELLIPTA) 100-25 MCG/INH AEPB Inhale 1 puff into the lungs daily.  . furosemide (LASIX) 20 MG tablet Take 1 tablet (20 mg total) by mouth daily as needed.  . gabapentin (NEURONTIN) 100 MG capsule Take 4 capsules tid  . isosorbide mononitrate (IMDUR) 30 MG 24 hr tablet Take 30 mg by mouth daily.   Marland Kitchen losartan (COZAAR) 100 MG tablet Take 1 tablet (100 mg total) by mouth daily.  . metoprolol tartrate (LOPRESSOR) 100 MG tablet Take 1 tablet (100 mg total) by mouth 2 (two) times daily.  . mupirocin ointment (BACTROBAN) 2 %  Apply 1 application topically 2 (two) times daily.  . ondansetron (ZOFRAN) 8 MG tablet Take by mouth every 8 (eight) hours as needed for nausea or vomiting.  . pantoprazole (PROTONIX) 40 MG tablet Take 1 tablet (40 mg total) by mouth 2 (two) times daily.  . promethazine (PHENERGAN) 12.5 MG tablet Take 12.5 mg by mouth every 6 (six) hours as needed for nausea or vomiting.  . sertraline (ZOLOFT) 50 MG tablet Take 1 tablet (50 mg total) by mouth daily.  . Tiotropium Bromide Monohydrate (SPIRIVA RESPIMAT) 1.25 MCG/ACT AERS Inhale 2.5 mcg daily into the lungs.  . vitamin B-12 (CYANOCOBALAMIN) 1000 MCG tablet Take 1,000 mcg by mouth daily.    Allergies  Allergen Reactions  . Ivp Dye [Iodinated Diagnostic Agents]     Contraindication secondary to IGMgammopathy/Waldren's syndrome     Recent Results (from the past 2160 hour(s))  Basic metabolic panel     Status: Abnormal   Collection Time: 08/18/17  8:20 AM  Result Value Ref Range   Sodium 130 (L) 135 - 145 mEq/L   Potassium 4.8 3.5 - 5.1 mEq/L   Chloride 98 96 - 112 mEq/L   CO2 24 19 - 32 mEq/L   Glucose, Bld 90 70 - 99 mg/dL   BUN 12 6 - 23 mg/dL   Creatinine, Ser 0.88 0.40 - 1.50 mg/dL    Calcium 9.3 8.4 - 10.5 mg/dL   GFR 89.63 >60.00 mL/min  Hepatic function panel     Status: None   Collection Time: 08/18/17  8:20 AM  Result Value Ref Range   Total Bilirubin 0.3 0.2 - 1.2 mg/dL   Bilirubin, Direct 0.0 0.0 - 0.3 mg/dL   Alkaline Phosphatase 53 39 - 117 U/L   AST 15 0 - 37 U/L   ALT 9 0 - 53 U/L   Total Protein 8.3 6.0 - 8.3 g/dL   Albumin 3.5 3.5 - 5.2 g/dL  Hemoglobin A1c     Status: None   Collection Time: 08/18/17  8:20 AM  Result Value Ref Range   Hgb A1c MFr Bld 5.7 4.6 - 6.5 %    Comment: Glycemic Control Guidelines for People with Diabetes:Non Diabetic:  <6%Goal of Therapy: <7%Additional Action Suggested:  >8%   Lipid panel     Status: None   Collection Time: 08/18/17  8:20 AM  Result Value Ref Range   Cholesterol 130 0 - 200 mg/dL    Comment: ATP III Classification       Desirable:  < 200 mg/dL               Borderline High:  200 - 239 mg/dL          High:  > = 240 mg/dL   Triglycerides 48.0 0.0 - 149.0 mg/dL    Comment: Normal:  <150 mg/dLBorderline High:  150 - 199 mg/dL   HDL 56.80 >39.00 mg/dL   VLDL 9.6 0.0 - 40.0 mg/dL   LDL Cholesterol 64 0 - 99 mg/dL   Total CHOL/HDL Ratio 2     Comment:                Men          Women1/2 Average Risk     3.4          3.3Average Risk          5.0          4.42X Average Risk  9.6          7.13X Average Risk          15.0          11.0                       NonHDL 73.16     Comment: NOTE:  Non-HDL goal should be 30 mg/dL higher than patient's LDL goal (i.e. LDL goal of < 70 mg/dL, would have non-HDL goal of < 100 mg/dL)  TSH     Status: None   Collection Time: 08/18/17  8:20 AM  Result Value Ref Range   TSH 1.75 0.35 - 4.50 uIU/mL  Protein electrophoresis, serum     Status: Abnormal   Collection Time: 09/18/17 11:26 AM  Result Value Ref Range   Total Protein ELP 8.3 6.0 - 8.5 g/dL   Albumin ELP 3.0 2.9 - 4.4 g/dL   Alpha-1-Globulin 0.6 (H) 0.0 - 0.4 g/dL   Alpha-2-Globulin 0.9 0.4 - 1.0 g/dL   Beta  Globulin 1.0 0.7 - 1.3 g/dL   Gamma Globulin 2.9 (H) 0.4 - 1.8 g/dL   M-Spike, % 2.3 (H) Not Observed g/dL   SPE Interp. Comment     Comment: (NOTE) The SPE pattern demonstrates a single peak (M-spike) in the gamma region which may represent monoclonal protein. This peak may also be caused by circulating immune complexes, cryoglobulins, C-reactive protein, fibrinogen or hemolysis.  If clinically indicated, the presence of a monoclonal gammopathy may be confirmed by immuno- fixation, as well as an evaluation of the urine for the presence of Bence-Jones protein. Performed At: [facility] [place], [place] [id number] Rush Farmer MD [id number]    Comment Comment     Comment: (NOTE) Protein electrophoresis scan will follow via computer, mail, or courier delivery.    GLOBULIN, TOTAL 5.3 (H) 2.2 - 3.9 g/dL   A/G Ratio 0.6 (L) 0.7 - 1.7    Comment: Performed at [facility], [address] [place], [place] [address]  CBC with Differential/Platelet     Status: Abnormal   Collection Time: 09/18/17 11:36 AM  Result Value Ref Range   WBC 6.1 3.8 - 10.6 K/uL   RBC 3.05 (L) 4.40 - 5.90 MIL/uL   Hemoglobin 10.2 (L) 13.0 - 18.0 g/dL   HCT 30.5 (L) 40.0 - 52.0 %   MCV 100.0 80.0 - 100.0 fL   MCH 33.6 26.0 - 34.0 pg   MCHC 33.5 32.0 - 36.0 g/dL   RDW 15.5 (H) 11.5 - 14.5 %   Platelets 508 (H) 150 - 440 K/uL   Neutrophils Relative % 68 %   Neutro Abs 4.2 1.4 - 6.5 K/uL   Lymphocytes Relative 23 %   Lymphs Abs 1.4 1.0 - 3.6 K/uL   Monocytes Relative 7 %   Monocytes Absolute 0.4 0.2 - 1.0 K/uL   Eosinophils Relative 1 %   Eosinophils Absolute 0.0 0 - 0.7 K/uL   Basophils Relative 1 %   Basophils Absolute 0.1 0 - 0.1 K/uL    Comment: Performed at [facility], [address] [place], [place] [address]  Basic metabolic panel     Status: Abnormal   Collection Time: 09/18/17 11:36 AM  Result Value Ref Range   Sodium 130 (L) 135 - 145 mmol/L    Potassium 4.9 3.5 - 5.1 mmol/L   Chloride 97 (L) 101 - 111 mmol/L   CO2 23 22 - 32  mmol/L   Glucose, Bld 101 (H) 65 - 99 mg/dL   BUN 15 6 - 20 mg/dL   Creatinine, Ser 1.12 0.61 - 1.24 mg/dL   Calcium 9.5 8.9 - 10.3 mg/dL   GFR calc non Af Amer >60 >60 mL/min   GFR calc Af Amer >60 >60 mL/min    Comment: (NOTE) The eGFR has been calculated using the CKD EPI equation. This calculation has not been validated in all clinical situations. eGFR's persistently <60 mL/min signify possible Chronic Kidney Disease.    Anion gap 10 5 - 15    Comment: Performed at [facility], [address] [place], [place] [address]  Ferritin     Status: Abnormal   Collection Time: 09/18/17 11:36 AM  Result Value Ref Range   Ferritin 18 (L) 24 - 336 ng/mL    Comment: Performed at [facility], [address] [place], [place] [address]  Iron and TIBC     Status: Abnormal   Collection Time: 09/18/17 11:36 AM  Result Value Ref Range   Iron 55 45 - 182 ug/dL   TIBC 1,137 (H) 250 - 450 ug/dL   Saturation Ratios 5 (L) 17.9 - 39.5 %   UIBC 1,082 ug/dL    Comment: Performed at [facility], [place], [place] [address]  IgG, IgA, IgM     Status: Abnormal   Collection Time: 09/18/17 11:36 AM  Result Value Ref Range   IgG (Immunoglobin G), Serum 546 (L) 700 - 1,600 mg/dL   IgA 40 (L) 61 - 437 mg/dL    Comment: Result confirmed on concentration.   IgM (Immunoglobulin M), Srm 3,635 (H) 15 - 143 mg/dL    Comment: (NOTE) Results confirmed on dilution. Performed At: [facility] [place], [place] [id number] Rush Farmer MD [id number] Performed at [facility], [address] [place], [place] [address]   Kappa/lambda light chains     Status: Abnormal   Collection Time: 09/18/17 11:36 AM  Result Value Ref Range   Kappa free light chain 73.0 (H) 3.3 - 19.4 mg/L   Lamda free light chains 13.5 5.7 - 26.3 mg/L   Kappa, lamda light chain  ratio 5.41 (H) 0.26 - 1.65    Comment: (NOTE) Performed At: [facility] [place], [place] [id number] Rush Farmer MD [phone] Performed at [facility], [address] [place], [place] [address]   CBC     Status: Abnormal   Collection Time: 10/16/17  8:47 AM  Result Value Ref Range   WBC 5.2 3.8 - 10.6 K/uL   RBC 3.13 (L) 4.40 - 5.90 MIL/uL   Hemoglobin 11.1 (L) 13.0 - 18.0 g/dL   HCT 33.4 (L) 40.0 - 52.0 %   MCV 106.7 (H) 80.0 - 100.0 fL   MCH 35.6 (H) 26.0 - 34.0 pg   MCHC 33.3 32.0 - 36.0 g/dL   RDW 20.4 (H) 11.5 - 14.5 %   Platelets 495 (H) 150 - 440 K/uL    Comment: Performed at [facility], [address] [place],  [address]  Comprehensive metabolic panel     Status: Abnormal   Collection Time: 10/27/17  3:34 PM  Result Value Ref Range   Sodium 126 (L) 135 - 145 mEq/L   Potassium 4.9 3.5 - 5.1 mEq/L   Chloride 94 (L) 96 - 112 mEq/L   CO2 24 19 - 32 mEq/L   Glucose, Bld 99 70 -  99 mg/dL   BUN 11 6 - 23 mg/dL   Creatinine, Ser 1.05 0.40 - 1.50 mg/dL   Total Bilirubin 0.5 0.2 - 1.2 mg/dL   Alkaline Phosphatase 50 39 - 117 U/L   AST 14 0 - 37 U/L   ALT 10 0 - 53 U/L   Total Protein 8.3 6.0 - 8.3 g/dL   Albumin 3.6 3.5 - 5.2 g/dL   Calcium 9.9 8.4 - 10.5 mg/dL   GFR 73.06 >60.00 mL/min  CBC with Differential/Platelet     Status: Abnormal   Collection Time: 10/27/17  3:34 PM  Result Value Ref Range   WBC 7.7 4.0 - 10.5 K/uL   RBC 3.19 (L) 4.22 - 5.81 Mil/uL   Hemoglobin 11.6 (L) 13.0 - 17.0 g/dL   HCT 33.8 (L) 39.0 - 52.0 %   MCV 105.9 (H) 78.0 - 100.0 fl   MCHC 34.3 30.0 - 36.0 g/dL   RDW 18.0 (H) 11.5 - 15.5 %   Platelets 371.0 150.0 - 400.0 K/uL   Neutrophils Relative % 67.8 43.0 - 77.0 %   Lymphocytes Relative 19.8 12.0 - 46.0 %   Monocytes Relative 10.5 3.0 - 12.0 %   Eosinophils Relative 0.1 0.0 - 5.0 %   Basophils Relative 1.8 0.0 - 3.0 %   Neutro Abs 5.2 1.4 - 7.7 K/uL   Lymphs Abs 1.5 0.7 - 4.0  K/uL   Monocytes Absolute 0.8 0.1 - 1.0 K/uL   Eosinophils Absolute 0.0 0.0 - 0.7 K/uL   Basophils Absolute 0.1 0.0 - 0.1 K/uL  Urine Microalbumin w/creat. ratio     Status: None   Collection Time: 10/27/17  3:34 PM  Result Value Ref Range   Microalb, Ur 1.9 0.0 - 1.9 mg/dL   Creatinine,U 204.4 mg/dL   Microalb Creat Ratio 0.9 0.0 - 30.0 mg/g  Osmolality     Status: Abnormal   Collection Time: 10/27/17  3:45 PM  Result Value Ref Range   Osmolality 270 (L) 278 - 305 mOsm/kg  Sodium, urine, random     Status: None   Collection Time: 10/27/17  3:45 PM  Result Value Ref Range   Sodium, Ur 29 28 - 272 mmol/L  Osmolality, urine     Status: None   Collection Time: 10/27/17  3:45 PM  Result Value Ref Range   Osmolality, Ur 453 50 - 1,200 mOsm/kg   Objective  Body mass index is 25.8 kg/m. Wt Readings from Last 3 Encounters:  10/27/17 185 lb (83.9 kg)  10/17/17 189 lb (85.7 kg)  10/09/17 189 lb 2 oz (85.8 kg)   Temp Readings from Last 3 Encounters:  10/27/17 99 F (37.2 C) (Oral)  10/17/17 97.8 F (36.6 C) (Tympanic)  10/09/17 98 F (36.7 C) (Oral)   BP Readings from Last 3 Encounters:  10/27/17 134/64  10/17/17 (!) 162/86  10/09/17 140/82   Pulse Readings from Last 3 Encounters:  10/27/17 72  10/17/17 (!) 56  10/09/17 69   O2 sat room air 92% room air  Physical Exam  Constitutional: He is oriented to person, place, and time and well-developed, well-nourished, and in no distress. Vital signs are normal.  HENT:  Head: Normocephalic and atraumatic.  Mouth/Throat: Oropharynx is clear and moist and mucous membranes are normal.  Eyes: Conjunctivae are normal. Pupils are equal, round, and reactive to light.  Cardiovascular: Normal rate, regular rhythm and normal heart sounds.  Pulmonary/Chest: Effort normal. He has wheezes.  Abdominal: Soft. Bowel sounds are normal. He  exhibits distension. There is no tenderness.  Neurological: He is alert and oriented to person, place, and  time. Gait normal. Gait normal.  CN 2-12 grossly intact motor function   Skin: Skin is warm, dry and intact.  Right elbow bruise   Psychiatric: Mood, memory, affect and judgment normal.  Nursing note and vitals reviewed.   Assessment   1. Constipation  2. Fall ? Syncope due to etoh abuse, hyponatremia vs other, orthostatics not checked today r/o anemia. FENA + prerenal hyponatremia w/u nonspecific.  3. Hyponatremia (see above, TSH nl less likely thyroid issues.  4. Right rib pain  5. Fall on eliquis ? If hit head  Plan  1. rx lactulose, warm prune juice, water, disc fiber intake via pills vs metamucil, prn miralax  2.  Check CT head, check CMET, urine labs w/u hyponatremia F/u with PCP in 1 week.  Consider cardiology referral in future last visit Dr. Josefa Half 05/2017  Needs to increase water intake Reviewed CT chest no lung malignancy as cause of hyponatremia  3. CMET, hyponatremia labs  4. CXR and Right rib Xray Pt requesting meds for pain will do short term tramadol ed can cause constipation  5. CT head   "I spent 30-35 minutes minutes face-to face with patient with greater than 50% of time spent counseling and/or in coordination of care I.e work up and plan of care    Spoke to pt 10/29/17 he is doing better had stool yesterday after lactulose and prune juice. He will go to [facility] 10/30/17 for CT head and BMET unable to go today   Provider: Dr. Olivia Mackie McLean-Scocuzza-Internal Medicine

## 2017-10-29 NOTE — Addendum Note (Signed)
Addended by: Orland Mustard on: 10/29/2017 01:58 PM   Modules accepted: Orders

## 2017-10-30 ENCOUNTER — Ambulatory Visit
Admission: RE | Admit: 2017-10-30 | Discharge: 2017-10-30 | Disposition: A | Discharge status: Home / Self Care | Payer: PPO | Source: Ambulatory Visit | Attending: Internal Medicine | Admitting: Internal Medicine

## 2017-10-30 DIAGNOSIS — R55 Syncope and collapse: Secondary | ICD-10-CM | POA: Diagnosis not present

## 2017-10-30 DIAGNOSIS — R51 Headache: Secondary | ICD-10-CM | POA: Diagnosis not present

## 2017-10-30 DIAGNOSIS — E871 Hypo-osmolality and hyponatremia: Secondary | ICD-10-CM | POA: Insufficient documentation

## 2017-10-30 DIAGNOSIS — W19XXXA Unspecified fall, initial encounter: Secondary | ICD-10-CM | POA: Insufficient documentation

## 2017-10-31 ENCOUNTER — Other Ambulatory Visit: Payer: Self-pay | Admitting: Internal Medicine

## 2017-10-31 ENCOUNTER — Encounter: Payer: Self-pay | Admitting: *Deleted

## 2017-10-31 ENCOUNTER — Other Ambulatory Visit (INDEPENDENT_AMBULATORY_CARE_PROVIDER_SITE_OTHER): Payer: PPO

## 2017-10-31 DIAGNOSIS — E871 Hypo-osmolality and hyponatremia: Secondary | ICD-10-CM

## 2017-10-31 LAB — BASIC METABOLIC PANEL
BUN: 10 mg/dL (ref 6–23)
CO2: 23 mEq/L (ref 19–32)
Calcium: 10 mg/dL (ref 8.4–10.5)
Chloride: 94 mEq/L — ABNORMAL LOW (ref 96–112)
Creatinine, Ser: 0.97 mg/dL (ref 0.40–1.50)
GFR: 80.06 mL/min (ref >60.00)
Glucose, Bld: 91 mg/dL (ref 70–99)
POTASSIUM: 4.3 meq/L (ref 3.5–5.1)
SODIUM: 124 meq/L — AB (ref 135–145)

## 2017-10-31 NOTE — Addendum Note (Signed)
Addended by: Leeanne Rio on: 10/31/2017 09:01 AM   Modules accepted: Orders

## 2017-11-04 ENCOUNTER — Ambulatory Visit (INDEPENDENT_AMBULATORY_CARE_PROVIDER_SITE_OTHER): Payer: PPO | Admitting: Internal Medicine

## 2017-11-04 ENCOUNTER — Encounter: Payer: Self-pay | Admitting: Internal Medicine

## 2017-11-04 VITALS — BP 130/82 | HR 64 | Temp 98.4°F | Resp 15 | Wt 186.0 lb

## 2017-11-04 DIAGNOSIS — F329 Major depressive disorder, single episode, unspecified: Secondary | ICD-10-CM | POA: Diagnosis not present

## 2017-11-04 DIAGNOSIS — I2510 Atherosclerotic heart disease of native coronary artery without angina pectoris: Secondary | ICD-10-CM

## 2017-11-04 DIAGNOSIS — K219 Gastro-esophageal reflux disease without esophagitis: Secondary | ICD-10-CM

## 2017-11-04 DIAGNOSIS — E871 Hypo-osmolality and hyponatremia: Secondary | ICD-10-CM

## 2017-11-04 DIAGNOSIS — I4892 Unspecified atrial flutter: Secondary | ICD-10-CM | POA: Diagnosis not present

## 2017-11-04 DIAGNOSIS — I10 Essential (primary) hypertension: Secondary | ICD-10-CM

## 2017-11-04 DIAGNOSIS — J449 Chronic obstructive pulmonary disease, unspecified: Secondary | ICD-10-CM

## 2017-11-04 DIAGNOSIS — K52831 Collagenous colitis: Secondary | ICD-10-CM

## 2017-11-04 DIAGNOSIS — F32A Depression, unspecified: Secondary | ICD-10-CM

## 2017-11-04 DIAGNOSIS — F1010 Alcohol abuse, uncomplicated: Secondary | ICD-10-CM | POA: Diagnosis not present

## 2017-11-04 LAB — SODIUM: SODIUM: 127 meq/L — AB (ref 135–145)

## 2017-11-04 NOTE — Progress Notes (Signed)
Patient ID: Troy Lopez, male   DOB: 1941-07-25, 77 y.o.   MRN: [id number]   Subjective:    Patient ID: Troy Lopez, male    DOB: [birth year], 77 y.o.   MRN: [id number]  HPI  Patient here as a work in for an acute follow up.  He was seen by Dr Aundra Dubin 10/27/17 with complaints of constipation and a fall.  See her note for details.  Was given a rx for lactulose.  CT head ordered.  Labs obtained.  Low sodium.  Has been worked up extensively.  Is followed by nephrology.  Is taking miralax.  States bowels are doing better.  Getting back to normal.  He feels better.  No headache.  No dizziness or light headedness.  No chest pain.  Breathing stable.  No acid reflux. No abdominal pain.  Discussed cutting back on alcohol intake. Discussed his recent fall.  He is not sure he lost consciousness.  Has had no residual problems.  Again, overall feels better and feels more like his normal self.      Past Medical History:  Diagnosis Date  . Adrenal gland anomaly    enlargement  . Anxiety   . CAD (coronary artery disease)   . Carpal tunnel syndrome   . Chronic hyponatremia   . Colitis   . Colonic polyp   . COPD (chronic obstructive pulmonary disease) ([place])   . Degenerative disc disease, lumbar   . Depression   . Diverticulosis   . Dyspnea   . GERD (gastroesophageal reflux disease)   . H/O degenerative disc disease   . Hypercholesterolemia   . Hyperkalemia   . Hyperlipidemia   . Hypertension   . Irritable bowel syndrome   . Monoclonal gammopathy   . Monoclonal gammopathy   . Neuropathy   . Personal history of tobacco use, presenting hazards to health 08/17/2015  . Sleep apnea   . Vertebral compression fracture [facility])    Past Surgical History:  Procedure Laterality Date  . BUNIONECTOMY  1989  . CARPAL TUNNEL RELEASE Left 2011   ulnar nerve sub muscular at elbow  . CHOLECYSTECTOMY  09/07  . COLONOSCOPY WITH PROPOFOL N/A 03/05/2017   Procedure: COLONOSCOPY WITH PROPOFOL;  Surgeon: Lucilla Lame, MD;  Location: [facility] ENDOSCOPY;  Service: Endoscopy;  Laterality: N/A;  . ELECTROPHYSIOLOGIC STUDY N/A 11/15/2015   Procedure: CARDIOVERSION;  Surgeon: Isaias Cowman, MD;  Location: [facility] ORS;  Service: Cardiovascular;  Laterality: N/A;  . ESOPHAGOGASTRODUODENOSCOPY (EGD) WITH PROPOFOL N/A 03/05/2017   Procedure: ESOPHAGOGASTRODUODENOSCOPY (EGD) WITH PROPOFOL;  Surgeon: Lucilla Lame, MD;  Location: [facility] ENDOSCOPY;  Service: Endoscopy;  Laterality: N/A;  . ESOPHAGOGASTRODUODENOSCOPY (EGD) WITH PROPOFOL N/A 10/17/2017   Procedure: ESOPHAGOGASTRODUODENOSCOPY (EGD) WITH PROPOFOL;  Surgeon: Lollie Sails, MD;  Location: [facility] ENDOSCOPY;  Service: Endoscopy;  Laterality: N/A;  . EYE SURGERY Bilateral 2010   cataract  . HEMORRHOID SURGERY    . KYPHOPLASTY N/A 11/04/2016   Procedure: KYPHOPLASTY T 12;  Surgeon: Hessie Knows, MD;  Location: [facility] ORS;  Service: Orthopedics;  Laterality: N/A;   Family History  Problem Relation Age of Onset  . Stroke Mother   . Heart disease Father        MI - 104   . Colon cancer Neg Hx   . Prostate cancer Neg Hx    Social History   Socioeconomic History  . Marital status: Married    Spouse name: Not on file  . Number of children: 3  . Years of  education: Not on file  . Highest education level: Not on file  Occupational History  . Not on file  Social Needs  . Financial resource strain: Not on file  . Food insecurity:    Worry: Not on file    Inability: Not on file  . Transportation needs:    Medical: Not on file    Non-medical: Not on file  Tobacco Use  . Smoking status: Former Smoker    Types: Cigarettes    Last attempt to quit: 11/01/2015    Years since quitting: 2.0  . Smokeless tobacco: Never Used  . Tobacco comment: about 2 cigarettes per day, trying to quit  Substance and Sexual Activity  . Alcohol use: Yes    Alcohol/week: 25.2 oz    Types: 42 Cans of beer per week    Comment: occas  . Drug use: No  . Sexual activity: Not on  file  Lifestyle  . Physical activity:    Days per week: Not on file    Minutes per session: Not on file  . Stress: Not on file  Relationships  . Social connections:    Talks on phone: Not on file    Gets together: Not on file    Attends religious service: Not on file    Active member of club or organization: Not on file    Attends meetings of clubs or organizations: Not on file    Relationship status: Not on file  Other Topics Concern  . Not on file  Social History Narrative  . Not on file    Outpatient Encounter Medications as of 11/04/2017  Medication Sig  . acetaminophen (TYLENOL) 500 MG tablet Take 500-1,000 mg by mouth 3 (three) times daily as needed for moderate pain or headache.  . albuterol (PROVENTIL HFA;VENTOLIN HFA) 108 (90 Base) MCG/ACT inhaler Inhale 2 puffs into the lungs every 6 (six) hours as needed for wheezing or shortness of breath.  Marland Kitchen alendronate (FOSAMAX) 70 MG tablet Take 70 mg by mouth once a week. Take with a full glass of water on an empty stomach. Takes on Sundays  . amLODipine (NORVASC) 10 MG tablet Take 1 tablet (5 mg total) by mouth daily.  . budesonide (ENTOCORT EC) 3 MG 24 hr capsule Take 9 mg by mouth daily as needed (colitis flare).   Marland Kitchen ELIQUIS 5 MG TABS tablet Take 5 mg by mouth 2 (two) times daily.   Marland Kitchen FIBER PO Take 2 capsules by mouth daily.  . fluticasone (FLONASE) 50 MCG/ACT nasal spray Place 2 sprays into both nostrils daily.  . fluticasone furoate-vilanterol (BREO ELLIPTA) 100-25 MCG/INH AEPB Inhale 1 puff into the lungs daily.  . furosemide (LASIX) 20 MG tablet Take 1 tablet (20 mg total) by mouth daily as needed.  . gabapentin (NEURONTIN) 100 MG capsule Take 4 capsules tid  . isosorbide mononitrate (IMDUR) 30 MG 24 hr tablet Take 30 mg by mouth daily.   Marland Kitchen losartan (COZAAR) 100 MG tablet Take 1 tablet (100 mg total) by mouth daily.  . metoprolol tartrate (LOPRESSOR) 100 MG tablet Take 1 tablet (100 mg total) by mouth 2 (two) times daily.  .  mupirocin ointment (BACTROBAN) 2 % Apply 1 application topically 2 (two) times daily.  . ondansetron (ZOFRAN) 8 MG tablet Take by mouth every 8 (eight) hours as needed for nausea or vomiting.  . pantoprazole (PROTONIX) 40 MG tablet Take 1 tablet (40 mg total) by mouth 2 (two) times daily.  . promethazine (PHENERGAN) 12.5  MG tablet Take 12.5 mg by mouth every 6 (six) hours as needed for nausea or vomiting.  . sertraline (ZOLOFT) 50 MG tablet Take 1 tablet (50 mg total) by mouth daily.  . Tiotropium Bromide Monohydrate (SPIRIVA RESPIMAT) 1.25 MCG/ACT AERS Inhale 2.5 mcg daily into the lungs.  . vitamin B-12 (CYANOCOBALAMIN) 1000 MCG tablet Take 1,000 mcg by mouth daily.   . famciclovir (FAMVIR) 500 MG tablet Take 1 tablet (500 mg total) 3 (three) times daily by mouth. (Patient not taking: Reported on 11/04/2017)  . traMADol (ULTRAM) 50 MG tablet Take 1 tablet (50 mg total) by mouth 2 (two) times daily as needed. (Patient not taking: Reported on 11/04/2017)   No facility-administered encounter medications on file as of 11/04/2017.     Review of Systems  Constitutional: Negative for appetite change and unexpected weight change.       Eating better now.  Appetite better.    HENT: Negative for congestion and sinus pressure.   Respiratory: Negative for cough, chest tightness and shortness of breath.   Cardiovascular: Negative for chest pain, palpitations and leg swelling.  Gastrointestinal: Negative for abdominal pain, nausea and vomiting.       No diarrhea.  Constipation resolved.    Genitourinary: Negative for difficulty urinating and dysuria.  Musculoskeletal: Negative for joint swelling and myalgias.  Skin: Negative for color change and rash.  Neurological: Negative for dizziness, light-headedness and headaches.  Psychiatric/Behavioral: Negative for agitation and dysphoric mood.       Objective:    Physical Exam  Constitutional: He appears well-developed and well-nourished. No distress.    HENT:  Nose: Nose normal.  Mouth/Throat: Oropharynx is clear and moist.  Neck: Neck supple. No thyromegaly present.  Cardiovascular: Normal rate and regular rhythm.  Pulmonary/Chest: Effort normal and breath sounds normal. No respiratory distress.  Abdominal: Soft. Bowel sounds are normal. There is no tenderness.  Musculoskeletal: He exhibits no edema or tenderness.  Lymphadenopathy:    He has no cervical adenopathy.  Skin: No rash noted. No erythema.  Psychiatric: He has a normal mood and affect. His behavior is normal.    BP 130/82 (BP Location: Left Arm, Patient Position: Sitting, Cuff Size: Normal)   Pulse 64   Temp 98.4 F (36.9 C) (Oral)   Resp 15   Wt 186 lb (84.4 kg)   SpO2 95%   BMI 25.94 kg/m  Wt Readings from Last 3 Encounters:  11/04/17 186 lb (84.4 kg)  10/27/17 185 lb (83.9 kg)  10/17/17 189 lb (85.7 kg)     Lab Results  Component Value Date   WBC 7.7 10/27/2017   HGB 11.6 (L) 10/27/2017   HCT 33.8 (L) 10/27/2017   PLT 371.0 10/27/2017   GLUCOSE 91 10/31/2017   CHOL 130 08/18/2017   TRIG 48.0 08/18/2017   HDL 56.80 08/18/2017   LDLCALC 64 08/18/2017   ALT 10 10/27/2017   AST 14 10/27/2017   NA 127 (L) 11/04/2017   K 4.3 10/31/2017   CL 94 (L) 10/31/2017   CREATININE 0.97 10/31/2017   BUN 10 10/31/2017   CO2 23 10/31/2017   TSH 1.75 08/18/2017   PSA 1.33 02/14/2016   INR 1.11 01/08/2017   HGBA1C 5.7 08/18/2017   MICROALBUR 1.9 10/27/2017    Ct Head Wo Contrast  Result Date: 10/30/2017 CLINICAL DATA:  Pt states fall one week ago, denies LOC. Hit head on right side and c/o headaches x 1 day, but is currently not experiencing any symptoms. EXAM: CT  HEAD WITHOUT CONTRAST TECHNIQUE: Contiguous axial images were obtained from the base of the skull through the vertex without intravenous contrast. COMPARISON:  10/31/2005 FINDINGS: Brain: No evidence of acute infarction, hemorrhage, hydrocephalus, extra-axial collection or mass lesion/mass effect. There  is mild ventricular and sulcal enlargement consistent with age-appropriate volume loss. Patchy white matter hypoattenuation is noted bilaterally consistent with moderate chronic microvascular ischemic change. Vascular: No hyperdense vessel or unexpected calcification. Skull: Normal. Negative for fracture or focal lesion. Sinuses/Orbits: Globes and orbits are unremarkable. 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 There is mild right frontal sinus mucosal thickening with a small amount of dependent fluid. Remaining visualized sinuses are clear. Clear mastoid air cells. Other: None. IMPRESSION: 1. No acute intracranial abnormalities.  No skull fracture. 2. Age-appropriate volume loss and moderate chronic microvascular ischemic change. Electronically Signed   By: Lajean Manes M.D.   On: 10/30/2017 13:57       Assessment & Plan:   Problem List Items Addressed This Visit    Alcohol abuse    Discussed again with him today regarding the need to cut back on his alcohol intake.  Follow.        Atrial flutter ([place])    S/p cardioversion.  On eliquis.  Stable.  Followed by cardiology.        CAD (coronary artery disease)    Continue risk factor modification.  Stable.  Followed by cardiology.        Collagenous colitis    Has intermittent flares.  Recent problem with constipation.  Taking miralax.  Doing better.  Bowels back to normal.   Follow.        Depression    On zoloft.  Stable.        GERD (gastroesophageal reflux disease)    Controlled on current regimen.  Follow.        Hypertension    Blood pressure under good control.  Continue same medication regimen.  Follow pressures.  Follow metabolic panel.        Hyponatremia - Primary    Sodium lower recently.  He is eating better now.  Discussed the need to decrease alcohol intake.  Has been worked up by nephrology.  Recheck sodium today.        Relevant Orders   Sodium (Completed)   Moderate COPD (chronic obstructive  pulmonary disease) (HCC)    Breathing stable.            Einar Pheasant, MD

## 2017-11-06 ENCOUNTER — Ambulatory Visit: Payer: PPO

## 2017-11-08 ENCOUNTER — Encounter: Payer: Self-pay | Admitting: Internal Medicine

## 2017-11-08 NOTE — Assessment & Plan Note (Signed)
Sodium lower recently.  He is eating better now.  Discussed the need to decrease alcohol intake.  Has been worked up by nephrology.  Recheck sodium today.

## 2017-11-08 NOTE — Assessment & Plan Note (Signed)
Discussed again with him today regarding the need to cut back on his alcohol intake.  Follow.

## 2017-11-08 NOTE — Assessment & Plan Note (Signed)
Blood pressure under good control.  Continue same medication regimen.  Follow pressures.  Follow metabolic panel.   

## 2017-11-08 NOTE — Assessment & Plan Note (Signed)
Controlled on current regimen.  Follow.  

## 2017-11-08 NOTE — Assessment & Plan Note (Signed)
Breathing stable.

## 2017-11-08 NOTE — Assessment & Plan Note (Signed)
Has intermittent flares.  Recent problem with constipation.  Taking miralax.  Doing better.  Bowels back to normal.   Follow.

## 2017-11-08 NOTE — Assessment & Plan Note (Signed)
Continue risk factor modification.  Stable.  Followed by cardiology.  

## 2017-11-08 NOTE — Assessment & Plan Note (Signed)
On zoloft.  Stable.  

## 2017-11-08 NOTE — Assessment & Plan Note (Signed)
S/p cardioversion.  On eliquis.  Stable.  Followed by cardiology.

## 2017-11-11 ENCOUNTER — Other Ambulatory Visit: Payer: Self-pay | Admitting: Internal Medicine

## 2017-11-11 DIAGNOSIS — E871 Hypo-osmolality and hyponatremia: Secondary | ICD-10-CM

## 2017-11-11 NOTE — Progress Notes (Signed)
Order placed for f/u sodium.  ?

## 2017-11-19 ENCOUNTER — Other Ambulatory Visit (INDEPENDENT_AMBULATORY_CARE_PROVIDER_SITE_OTHER): Payer: PPO

## 2017-11-19 DIAGNOSIS — E871 Hypo-osmolality and hyponatremia: Secondary | ICD-10-CM | POA: Diagnosis not present

## 2017-11-19 LAB — SODIUM: Sodium: 132 mEq/L — ABNORMAL LOW (ref 135–145)

## 2017-11-20 ENCOUNTER — Encounter: Payer: Self-pay | Admitting: Internal Medicine

## 2017-11-26 ENCOUNTER — Other Ambulatory Visit: Payer: Self-pay | Admitting: Internal Medicine

## 2017-12-02 DIAGNOSIS — M47816 Spondylosis without myelopathy or radiculopathy, lumbar region: Secondary | ICD-10-CM | POA: Diagnosis not present

## 2017-12-10 DIAGNOSIS — I10 Essential (primary) hypertension: Secondary | ICD-10-CM | POA: Diagnosis not present

## 2017-12-10 DIAGNOSIS — G4733 Obstructive sleep apnea (adult) (pediatric): Secondary | ICD-10-CM | POA: Diagnosis not present

## 2017-12-10 DIAGNOSIS — Z720 Tobacco use: Secondary | ICD-10-CM | POA: Diagnosis not present

## 2017-12-10 DIAGNOSIS — J410 Simple chronic bronchitis: Secondary | ICD-10-CM | POA: Diagnosis not present

## 2017-12-10 DIAGNOSIS — Z9889 Other specified postprocedural states: Secondary | ICD-10-CM | POA: Diagnosis not present

## 2017-12-10 DIAGNOSIS — E7800 Pure hypercholesterolemia, unspecified: Secondary | ICD-10-CM | POA: Diagnosis not present

## 2017-12-10 DIAGNOSIS — R0789 Other chest pain: Secondary | ICD-10-CM | POA: Diagnosis not present

## 2017-12-16 ENCOUNTER — Telehealth: Payer: Self-pay | Admitting: Radiology

## 2017-12-16 ENCOUNTER — Other Ambulatory Visit: Payer: Self-pay | Admitting: Internal Medicine

## 2017-12-16 DIAGNOSIS — E785 Hyperlipidemia, unspecified: Secondary | ICD-10-CM

## 2017-12-16 DIAGNOSIS — D509 Iron deficiency anemia, unspecified: Secondary | ICD-10-CM

## 2017-12-16 DIAGNOSIS — R739 Hyperglycemia, unspecified: Secondary | ICD-10-CM

## 2017-12-16 DIAGNOSIS — D472 Monoclonal gammopathy: Secondary | ICD-10-CM

## 2017-12-16 DIAGNOSIS — I10 Essential (primary) hypertension: Secondary | ICD-10-CM

## 2017-12-16 DIAGNOSIS — E871 Hypo-osmolality and hyponatremia: Secondary | ICD-10-CM

## 2017-12-16 DIAGNOSIS — Z125 Encounter for screening for malignant neoplasm of prostate: Secondary | ICD-10-CM

## 2017-12-16 NOTE — Telephone Encounter (Signed)
Orders placed for labs

## 2017-12-16 NOTE — Progress Notes (Signed)
Orders placed for labs

## 2017-12-16 NOTE — Telephone Encounter (Signed)
Pt coming for labs tomorrow, please place future orders. Thank you

## 2017-12-17 ENCOUNTER — Other Ambulatory Visit: Payer: Self-pay | Admitting: Internal Medicine

## 2017-12-17 ENCOUNTER — Other Ambulatory Visit (INDEPENDENT_AMBULATORY_CARE_PROVIDER_SITE_OTHER): Payer: PPO

## 2017-12-17 DIAGNOSIS — R739 Hyperglycemia, unspecified: Secondary | ICD-10-CM | POA: Diagnosis not present

## 2017-12-17 DIAGNOSIS — Z125 Encounter for screening for malignant neoplasm of prostate: Secondary | ICD-10-CM

## 2017-12-17 DIAGNOSIS — E785 Hyperlipidemia, unspecified: Secondary | ICD-10-CM

## 2017-12-17 DIAGNOSIS — I10 Essential (primary) hypertension: Secondary | ICD-10-CM | POA: Diagnosis not present

## 2017-12-17 DIAGNOSIS — D509 Iron deficiency anemia, unspecified: Secondary | ICD-10-CM

## 2017-12-17 DIAGNOSIS — D649 Anemia, unspecified: Secondary | ICD-10-CM

## 2017-12-17 LAB — LIPID PANEL
CHOL/HDL RATIO: 2
CHOLESTEROL: 148 mg/dL (ref 0–200)
HDL: 73.9 mg/dL (ref >39.00)
LDL Cholesterol: 61 mg/dL (ref 0–99)
NonHDL: 74.33
TRIGLYCERIDES: 66 mg/dL (ref 0.0–149.0)
VLDL: 13.2 mg/dL (ref 0.0–40.0)

## 2017-12-17 LAB — FERRITIN: Ferritin: 15.3 ng/mL — ABNORMAL LOW (ref 22.0–322.0)

## 2017-12-17 LAB — HEPATIC FUNCTION PANEL
ALBUMIN: 3.6 g/dL (ref 3.5–5.2)
ALT: 7 U/L (ref 0–53)
AST: 12 U/L (ref 0–37)
Alkaline Phosphatase: 44 U/L (ref 39–117)
BILIRUBIN DIRECT: 0.5 mg/dL — AB (ref 0.0–0.3)
TOTAL PROTEIN: 8 g/dL (ref 6.0–8.3)
Total Bilirubin: 0.2 mg/dL (ref 0.2–1.2)

## 2017-12-17 LAB — BASIC METABOLIC PANEL
BUN: 11 mg/dL (ref 6–23)
CO2: 25 meq/L (ref 19–32)
CREATININE: 0.96 mg/dL (ref 0.40–1.50)
Calcium: 9.8 mg/dL (ref 8.4–10.5)
Chloride: 97 mEq/L (ref 96–112)
GFR: 80.99 mL/min (ref >60.00)
Glucose, Bld: 77 mg/dL (ref 70–99)
Potassium: 5 mEq/L (ref 3.5–5.1)
SODIUM: 130 meq/L — AB (ref 135–145)

## 2017-12-17 LAB — CBC WITH DIFFERENTIAL/PLATELET
BASOS PCT: 0.8 % (ref 0.0–3.0)
Basophils Absolute: 0.1 10*3/uL (ref 0.0–0.1)
EOS ABS: 0 10*3/uL (ref 0.0–0.7)
EOS PCT: 0.4 % (ref 0.0–5.0)
HCT: 32.3 % — ABNORMAL LOW (ref 39.0–52.0)
Hemoglobin: 11 g/dL — ABNORMAL LOW (ref 13.0–17.0)
LYMPHS ABS: 1.7 10*3/uL (ref 0.7–4.0)
Lymphocytes Relative: 27.2 % (ref 12.0–46.0)
MCHC: 34.2 g/dL (ref 30.0–36.0)
MCV: 101.7 fl — ABNORMAL HIGH (ref 78.0–100.0)
MONO ABS: 0.7 10*3/uL (ref 0.1–1.0)
Monocytes Relative: 10.9 % (ref 3.0–12.0)
NEUTROS PCT: 60.7 % (ref 43.0–77.0)
Neutro Abs: 3.9 10*3/uL (ref 1.4–7.7)
PLATELETS: 408 10*3/uL — AB (ref 150.0–400.0)
RBC: 3.17 Mil/uL — ABNORMAL LOW (ref 4.22–5.81)
RDW: 16.5 % — AB (ref 11.5–15.5)
WBC: 6.3 10*3/uL (ref 4.0–10.5)

## 2017-12-17 LAB — PSA, MEDICARE: PSA: 1.79 ng/mL (ref 0.10–4.00)

## 2017-12-17 LAB — HEMOGLOBIN A1C: Hgb A1c MFr Bld: 5.9 % (ref 4.6–6.5)

## 2017-12-17 NOTE — Progress Notes (Signed)
Order placed for f/u B12 

## 2017-12-19 ENCOUNTER — Other Ambulatory Visit: Payer: Self-pay | Admitting: *Deleted

## 2017-12-19 DIAGNOSIS — C8800 Waldenstrom macroglobulinemia not having achieved remission: Secondary | ICD-10-CM

## 2017-12-22 ENCOUNTER — Ambulatory Visit: Payer: PPO | Admitting: Internal Medicine

## 2017-12-22 NOTE — Progress Notes (Signed)
[place]  [phone] [phone]  ID: Duane Lope Kirsch OB: 1941/06/20  MR#: [id number]  VXY#:[id number]  Patient Care Team: Einar Pheasant, MD as PCP - General (Internal Medicine)  CHIEF COMPLAINT: [name]'s macroglobulinemia, iron deficiency anemia.  INTERVAL HISTORY: Patient returns to clinic today for repeat laboratory and further evaluation.  He continues to have chronic weakness and fatigue, but otherwise feels well.  He has no neurologic complaints. He denies any recent fevers or illnesses.  He has a good appetite and denies weight loss. He has no chest pain or shortness of breath. He denies any nausea, vomiting, constipation, or diarrhea.  He denies any recent melena or hematochezia.  He has no urinary complaints.  Patient otherwise feels well and offers no further specific complaints today.  REVIEW OF SYSTEMS:   Review of Systems  Constitutional: Positive for malaise/fatigue. Negative for fever and weight loss.  Respiratory: Negative.  Negative for cough, hemoptysis and shortness of breath.   Cardiovascular: Negative.  Negative for chest pain and leg swelling.  Gastrointestinal: Negative.  Negative for abdominal pain, blood in stool, diarrhea and melena.  Genitourinary: Negative.  Negative for hematuria.  Musculoskeletal: Positive for back pain.  Skin: Negative.  Negative for rash.  Neurological: Positive for weakness. Negative for sensory change.  Psychiatric/Behavioral: Negative.  The patient is not nervous/anxious.     As per HPI. Otherwise, a complete review of systems is negative.  PAST MEDICAL HISTORY: Past Medical History:  Diagnosis Date  . Adrenal gland anomaly    enlargement  . Anxiety   . CAD (coronary artery disease)   . Carpal tunnel syndrome   . Chronic hyponatremia   . Colitis   . Colonic polyp   . COPD (chronic obstructive pulmonary disease) ([place])   . Degenerative disc disease, lumbar   . Depression   .  Diverticulosis   . Dyspnea   . GERD (gastroesophageal reflux disease)   . H/O degenerative disc disease   . Hypercholesterolemia   . Hyperkalemia   . Hyperlipidemia   . Hypertension   . Irritable bowel syndrome   . Monoclonal gammopathy   . Monoclonal gammopathy   . Neuropathy   . Personal history of tobacco use, presenting hazards to health 08/17/2015  . Sleep apnea   . Vertebral compression fracture ([place])     PAST SURGICAL HISTORY: Past Surgical History:  Procedure Laterality Date  . BUNIONECTOMY  1989  . CARPAL TUNNEL RELEASE Left 2011   ulnar nerve sub muscular at elbow  . CHOLECYSTECTOMY  09/07  . COLONOSCOPY WITH PROPOFOL N/A 03/05/2017   Procedure: COLONOSCOPY WITH PROPOFOL;  Surgeon: Lucilla Lame, MD;  Location: [facility] ENDOSCOPY;  Service: Endoscopy;  Laterality: N/A;  . ELECTROPHYSIOLOGIC STUDY N/A 11/15/2015   Procedure: CARDIOVERSION;  Surgeon: Isaias Cowman, MD;  Location: [facility] ORS;  Service: Cardiovascular;  Laterality: N/A;  . ESOPHAGOGASTRODUODENOSCOPY (EGD) WITH PROPOFOL N/A 03/05/2017   Procedure: ESOPHAGOGASTRODUODENOSCOPY (EGD) WITH PROPOFOL;  Surgeon: Lucilla Lame, MD;  Location: [facility] ENDOSCOPY;  Service: Endoscopy;  Laterality: N/A;  . ESOPHAGOGASTRODUODENOSCOPY (EGD) WITH PROPOFOL N/A 10/17/2017   Procedure: ESOPHAGOGASTRODUODENOSCOPY (EGD) WITH PROPOFOL;  Surgeon: Lollie Sails, MD;  Location: [facility] ENDOSCOPY;  Service: Endoscopy;  Laterality: N/A;  . EYE SURGERY Bilateral 2010   cataract  . HEMORRHOID SURGERY    . KYPHOPLASTY N/A 11/04/2016   Procedure: KYPHOPLASTY T 12;  Surgeon: Hessie Knows, MD;  Location: [facility] ORS;  Service: Orthopedics;  Laterality: N/A;    FAMILY HISTORY Family History  Problem Relation Age of Onset  . Stroke Mother   . Heart disease Father        MI - 76   . Colon cancer Neg Hx   . Prostate cancer Neg Hx        ADVANCED DIRECTIVES:    HEALTH MAINTENANCE: Social History   Tobacco Use  . Smoking status: Former  Smoker    Types: Cigarettes    Last attempt to quit: 11/01/2015    Years since quitting: 2.1  . Smokeless tobacco: Never Used  . Tobacco comment: about 2 cigarettes per day, trying to quit  Substance Use Topics  . Alcohol use: Yes    Alcohol/week: 25.2 oz    Types: 42 Cans of beer per week    Comment: occas  . Drug use: No     Colonoscopy:  PAP:  Bone density:  Lipid panel:  Allergies  Allergen Reactions  . Ivp Dye [Iodinated Diagnostic Agents]     Contraindication secondary to IGMgammopathy/Waldren's syndrome      Current Outpatient Medications  Medication Sig Dispense Refill  . acetaminophen (TYLENOL) 500 MG tablet Take 500-1,000 mg by mouth 3 (three) times daily as needed for moderate pain or headache.    . albuterol (PROVENTIL HFA;VENTOLIN HFA) 108 (90 Base) MCG/ACT inhaler Inhale 2 puffs into the lungs every 6 (six) hours as needed for wheezing or shortness of breath.    Marland Kitchen alendronate (FOSAMAX) 70 MG tablet Take 70 mg by mouth once a week. Take with a full glass of water on an empty stomach. Takes on Sundays    . amLODipine (NORVASC) 10 MG tablet Take 1 tablet (5 mg total) by mouth daily. 90 tablet 0  . budesonide (ENTOCORT EC) 3 MG 24 hr capsule Take 9 mg by mouth daily as needed (colitis flare).     Marland Kitchen ELIQUIS 5 MG TABS tablet Take 5 mg by mouth 2 (two) times daily.     Marland Kitchen FIBER PO Take 2 capsules by mouth daily.    . fluticasone (FLONASE) 50 MCG/ACT nasal spray Place 2 sprays into both nostrils daily. 16 g 0  . fluticasone furoate-vilanterol (BREO ELLIPTA) 100-25 MCG/INH AEPB Inhale 1 puff into the lungs daily. 180 each 4  . furosemide (LASIX) 20 MG tablet Take 1 tablet (20 mg total) by mouth daily as needed. 30 tablet 0  . gabapentin (NEURONTIN) 100 MG capsule TAKE 4 CAPSULES THREE TIMES A DAY. 360 capsule 0  . isosorbide mononitrate (IMDUR) 30 MG 24 hr tablet Take 30 mg by mouth daily.     Marland Kitchen losartan (COZAAR) 100 MG tablet Take 1 tablet (100 mg total) by mouth daily.  90 tablet 0  . metoprolol tartrate (LOPRESSOR) 100 MG tablet Take 1 tablet (100 mg total) by mouth 2 (two) times daily. 60 tablet 0  . mupirocin ointment (BACTROBAN) 2 % Apply 1 application topically 2 (two) times daily. 22 g 0  . ondansetron (ZOFRAN) 8 MG tablet Take by mouth every 8 (eight) hours as needed for nausea or vomiting.    . pantoprazole (PROTONIX) 40 MG tablet Take 1 tablet (40 mg total) by mouth 2 (two) times daily. 180 tablet 0  . promethazine (PHENERGAN) 12.5 MG tablet Take 12.5 mg by mouth every 6 (six) hours as needed for nausea or vomiting.    . sertraline (ZOLOFT) 50 MG tablet Take 1 tablet (50 mg total) by mouth daily. 30 tablet 0  . Tiotropium Bromide Monohydrate (SPIRIVA RESPIMAT) 1.25 MCG/ACT AERS Inhale 2.5  mcg daily into the lungs. 4 g 10  . vitamin B-12 (CYANOCOBALAMIN) 1000 MCG tablet Take 1,000 mcg by mouth daily.      No current facility-administered medications for this visit.    Facility-Administered Medications Ordered in Other Visits  Medication Dose Route Frequency Provider Last Rate Last Dose  . 0.9 %  sodium chloride infusion   Intravenous Continuous Lloyd Huger, MD   Stopped at 12/26/17 1219    OBJECTIVE: Vitals:   12/26/17 1046  BP: 138/78  Pulse: (!) 56  Resp: 20  Temp: (!) 97.3 F (36.3 C)     Body mass index is 25.58 kg/m.    ECOG FS:0 - Asymptomatic  General: Well-developed, well-nourished, no acute distress. Eyes: Pink conjunctiva, anicteric sclera. HEENT: Normocephalic, moist mucous membranes, clear oropharnyx. Lungs: Clear to auscultation bilaterally. Heart: Regular rate and rhythm. No rubs, murmurs, or gallops. Abdomen: Soft, nontender, nondistended. No organomegaly noted, normoactive bowel sounds. Musculoskeletal: No edema, cyanosis, or clubbing. Neuro: Alert, answering all questions appropriately. Cranial nerves grossly intact. Skin: No rashes or petechiae noted. Psych: Normal affect. Lymphatics: No cervical, calvicular,  axillary or inguinal LAD.   LAB RESULTS:  Lab Results  Component Value Date   NA 129 (L) 12/23/2017   K 4.7 12/23/2017   CL 96 (L) 12/23/2017   CO2 23 12/23/2017   GLUCOSE 102 (H) 12/23/2017   BUN 16 12/23/2017   CREATININE 0.95 12/23/2017   CALCIUM 8.9 12/23/2017   PROT 8.0 12/17/2017   ALBUMIN 3.6 12/17/2017   AST 12 12/17/2017   ALT 7 12/17/2017   ALKPHOS 44 12/17/2017   BILITOT 0.2 12/17/2017   GFRNONAA >60 12/23/2017   GFRAA >60 12/23/2017    Lab Results  Component Value Date   WBC 8.1 12/23/2017   NEUTROABS 5.3 12/23/2017   HGB 10.5 (L) 12/23/2017   HCT 30.7 (L) 12/23/2017   MCV 100.0 12/23/2017   PLT 409 12/23/2017   Lab Results  Component Value Date   IRON 63 12/23/2017   TIBC 1,156 (H) 12/23/2017   IRONPCTSAT 5 (L) 12/23/2017    Lab Results  Component Value Date   FERRITIN 18 (L) 12/23/2017   Lab Results  Component Value Date   TOTALPROTELP 8.1 12/23/2017   ALBUMINELP 3.4 12/23/2017   A1GS 0.2 12/23/2017   A2GS 0.6 12/23/2017   BETS 1.4 (H) 12/23/2017   BETA2SER 0.2 02/14/2016   GAMS 2.4 (H) 12/23/2017   MSPIKE 1.8 (H) 12/23/2017   SPEI Comment 12/23/2017    STUDIES: No results found.  ASSESSMENT: [name]'s macroglobulinemia, iron deficiency anemia.  PLAN:    1. [name]'s macroglobulinemia:  Bone marrow biopsy results reviewed independently confirming underlying [name]'s.  Patient's most recent M spike from Dec 23, 2017 was reported 1.8 which is slightly decreased from previous.  His IgM remains significantly elevated as well, but has trended down slightly. Patient has iron deficiency anemia from a GI source, but no other evidence of endorgan damage. He does not require any treatment at this time. If he had progression of disease as evidenced by B-symptoms or endorgan damage, will consider Rituxan-based therapy.  Repeat laboratory work in 6 months. 2. 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  Patient's hemoglobin has trended down along with his iron stores.  Proceed with 510 mg IV Feraheme today.  Return to clinic in 1 week for second infusion and  then in 3 months for repeat laboratory work and further evaluation.    Approximately 30 minutes was spent in discussion of which greater than 50% was consultation.  Patient expressed understanding and was in agreement with this plan. He also understands that He can call clinic at any time with any questions, concerns, or complaints.    Lloyd Huger, MD   12/26/2017 1:02 PM

## 2017-12-23 ENCOUNTER — Inpatient Hospital Stay: Payer: PPO | Attending: Oncology

## 2017-12-23 DIAGNOSIS — Z86010 Personal history of colonic polyps: Secondary | ICD-10-CM | POA: Diagnosis not present

## 2017-12-23 DIAGNOSIS — K219 Gastro-esophageal reflux disease without esophagitis: Secondary | ICD-10-CM | POA: Diagnosis not present

## 2017-12-23 DIAGNOSIS — G629 Polyneuropathy, unspecified: Secondary | ICD-10-CM | POA: Diagnosis not present

## 2017-12-23 DIAGNOSIS — E279 Disorder of adrenal gland, unspecified: Secondary | ICD-10-CM | POA: Diagnosis not present

## 2017-12-23 DIAGNOSIS — I2510 Atherosclerotic heart disease of native coronary artery without angina pectoris: Secondary | ICD-10-CM | POA: Insufficient documentation

## 2017-12-23 DIAGNOSIS — D509 Iron deficiency anemia, unspecified: Secondary | ICD-10-CM | POA: Insufficient documentation

## 2017-12-23 DIAGNOSIS — Z79899 Other long term (current) drug therapy: Secondary | ICD-10-CM | POA: Diagnosis not present

## 2017-12-23 DIAGNOSIS — Z87891 Personal history of nicotine dependence: Secondary | ICD-10-CM | POA: Diagnosis not present

## 2017-12-23 DIAGNOSIS — E785 Hyperlipidemia, unspecified: Secondary | ICD-10-CM | POA: Diagnosis not present

## 2017-12-23 DIAGNOSIS — J449 Chronic obstructive pulmonary disease, unspecified: Secondary | ICD-10-CM | POA: Diagnosis not present

## 2017-12-23 DIAGNOSIS — F329 Major depressive disorder, single episode, unspecified: Secondary | ICD-10-CM | POA: Diagnosis not present

## 2017-12-23 DIAGNOSIS — E7800 Pure hypercholesterolemia, unspecified: Secondary | ICD-10-CM | POA: Insufficient documentation

## 2017-12-23 DIAGNOSIS — R531 Weakness: Secondary | ICD-10-CM | POA: Diagnosis not present

## 2017-12-23 DIAGNOSIS — I10 Essential (primary) hypertension: Secondary | ICD-10-CM | POA: Diagnosis not present

## 2017-12-23 DIAGNOSIS — M5136 Other intervertebral disc degeneration, lumbar region: Secondary | ICD-10-CM | POA: Insufficient documentation

## 2017-12-23 DIAGNOSIS — C880 Waldenstrom macroglobulinemia: Secondary | ICD-10-CM | POA: Diagnosis not present

## 2017-12-23 DIAGNOSIS — R5383 Other fatigue: Secondary | ICD-10-CM | POA: Insufficient documentation

## 2017-12-23 LAB — CBC WITH DIFFERENTIAL/PLATELET
Basophils Absolute: 0.1 10*3/uL (ref 0–0.1)
Basophils Relative: 1 %
EOS PCT: 1 %
Eosinophils Absolute: 0 10*3/uL (ref 0–0.7)
HEMATOCRIT: 30.7 % — AB (ref 40.0–52.0)
Hemoglobin: 10.5 g/dL — ABNORMAL LOW (ref 13.0–18.0)
LYMPHS ABS: 2 10*3/uL (ref 1.0–3.6)
LYMPHS PCT: 25 %
MCH: 34.2 pg — AB (ref 26.0–34.0)
MCHC: 34.2 g/dL (ref 32.0–36.0)
MCV: 100 fL (ref 80.0–100.0)
MONOS PCT: 9 %
Monocytes Absolute: 0.7 10*3/uL (ref 0.2–1.0)
NEUTROS PCT: 64 %
Neutro Abs: 5.3 10*3/uL (ref 1.4–6.5)
PLATELETS: 409 10*3/uL (ref 150–440)
RBC: 3.07 MIL/uL — ABNORMAL LOW (ref 4.40–5.90)
RDW: 16.9 % — AB (ref 11.5–14.5)
WBC: 8.1 10*3/uL (ref 3.8–10.6)

## 2017-12-23 LAB — BASIC METABOLIC PANEL
Anion gap: 10 (ref 5–15)
BUN: 16 mg/dL (ref 6–20)
CHLORIDE: 96 mmol/L — AB (ref 101–111)
CO2: 23 mmol/L (ref 22–32)
CREATININE: 0.95 mg/dL (ref 0.61–1.24)
Calcium: 8.9 mg/dL (ref 8.9–10.3)
GFR calc Af Amer: >60 mL/min (ref >60)
GFR calc non Af Amer: >60 mL/min (ref >60)
Glucose, Bld: 102 mg/dL — ABNORMAL HIGH (ref 65–99)
POTASSIUM: 4.7 mmol/L (ref 3.5–5.1)
Sodium: 129 mmol/L — ABNORMAL LOW (ref 135–145)

## 2017-12-23 LAB — IRON AND TIBC
IRON: 63 ug/dL (ref 45–182)
SATURATION RATIOS: 5 % — AB (ref 17.9–39.5)
TIBC: 1156 ug/dL — AB (ref 250–450)
UIBC: 1093 ug/dL

## 2017-12-23 LAB — FERRITIN: Ferritin: 18 ng/mL — ABNORMAL LOW (ref 24–336)

## 2017-12-24 ENCOUNTER — Other Ambulatory Visit: Payer: Self-pay | Admitting: Internal Medicine

## 2017-12-24 LAB — IGG, IGA, IGM
IGA: 39 mg/dL — AB (ref 61–437)
IGM (IMMUNOGLOBULIN M), SRM: 3368 mg/dL — AB (ref 15–143)
IgG (Immunoglobin G), Serum: 483 mg/dL — ABNORMAL LOW (ref 700–1600)

## 2017-12-24 LAB — KAPPA/LAMBDA LIGHT CHAINS
KAPPA, LAMDA LIGHT CHAIN RATIO: 5.71 — AB (ref 0.26–1.65)
Kappa free light chain: 75.9 mg/L — ABNORMAL HIGH (ref 3.3–19.4)
Lambda free light chains: 13.3 mg/L (ref 5.7–26.3)

## 2017-12-25 LAB — PROTEIN ELECTROPHORESIS, SERUM
A/G RATIO SPE: 0.7 (ref 0.7–1.7)
Albumin ELP: 3.4 g/dL (ref 2.9–4.4)
Alpha-1-Globulin: 0.2 g/dL (ref 0.0–0.4)
Alpha-2-Globulin: 0.6 g/dL (ref 0.4–1.0)
BETA GLOBULIN: 1.4 g/dL — AB (ref 0.7–1.3)
Gamma Globulin: 2.4 g/dL — ABNORMAL HIGH (ref 0.4–1.8)
Globulin, Total: 4.7 g/dL — ABNORMAL HIGH (ref 2.2–3.9)
M-Spike, %: 1.8 g/dL — ABNORMAL HIGH
Total Protein ELP: 8.1 g/dL (ref 6.0–8.5)

## 2017-12-26 ENCOUNTER — Encounter: Payer: Self-pay | Admitting: Oncology

## 2017-12-26 ENCOUNTER — Other Ambulatory Visit: Payer: PPO

## 2017-12-26 ENCOUNTER — Inpatient Hospital Stay: Payer: PPO

## 2017-12-26 ENCOUNTER — Inpatient Hospital Stay (HOSPITAL_BASED_OUTPATIENT_CLINIC_OR_DEPARTMENT_OTHER): Payer: PPO | Admitting: Oncology

## 2017-12-26 VITALS — BP 138/78 | HR 56 | Temp 97.3°F | Resp 20 | Wt 183.4 lb

## 2017-12-26 DIAGNOSIS — F329 Major depressive disorder, single episode, unspecified: Secondary | ICD-10-CM

## 2017-12-26 DIAGNOSIS — Z87891 Personal history of nicotine dependence: Secondary | ICD-10-CM

## 2017-12-26 DIAGNOSIS — K219 Gastro-esophageal reflux disease without esophagitis: Secondary | ICD-10-CM

## 2017-12-26 DIAGNOSIS — C880 Waldenstrom macroglobulinemia: Secondary | ICD-10-CM | POA: Diagnosis not present

## 2017-12-26 DIAGNOSIS — E279 Disorder of adrenal gland, unspecified: Secondary | ICD-10-CM

## 2017-12-26 DIAGNOSIS — J449 Chronic obstructive pulmonary disease, unspecified: Secondary | ICD-10-CM

## 2017-12-26 DIAGNOSIS — I2510 Atherosclerotic heart disease of native coronary artery without angina pectoris: Secondary | ICD-10-CM | POA: Diagnosis not present

## 2017-12-26 DIAGNOSIS — E7800 Pure hypercholesterolemia, unspecified: Secondary | ICD-10-CM

## 2017-12-26 DIAGNOSIS — R5383 Other fatigue: Secondary | ICD-10-CM

## 2017-12-26 DIAGNOSIS — M5136 Other intervertebral disc degeneration, lumbar region: Secondary | ICD-10-CM

## 2017-12-26 DIAGNOSIS — D509 Iron deficiency anemia, unspecified: Secondary | ICD-10-CM

## 2017-12-26 DIAGNOSIS — E785 Hyperlipidemia, unspecified: Secondary | ICD-10-CM

## 2017-12-26 DIAGNOSIS — R531 Weakness: Secondary | ICD-10-CM | POA: Diagnosis not present

## 2017-12-26 DIAGNOSIS — G629 Polyneuropathy, unspecified: Secondary | ICD-10-CM

## 2017-12-26 DIAGNOSIS — D649 Anemia, unspecified: Secondary | ICD-10-CM

## 2017-12-26 DIAGNOSIS — Z79899 Other long term (current) drug therapy: Secondary | ICD-10-CM

## 2017-12-26 DIAGNOSIS — Z86010 Personal history of colonic polyps: Secondary | ICD-10-CM

## 2017-12-26 DIAGNOSIS — I10 Essential (primary) hypertension: Secondary | ICD-10-CM

## 2017-12-26 MED ORDER — SODIUM CHLORIDE 0.9 % IV SOLN
INTRAVENOUS | Status: DC
  Administered 2017-12-26: 12:00:00 via INTRAVENOUS
  Filled 2017-12-26: qty 1000

## 2017-12-26 MED ORDER — FERUMOXYTOL INJECTION 510 MG/17 ML
INTRAVENOUS | Status: AC
  Filled 2017-12-26: qty 17

## 2017-12-26 MED ORDER — SODIUM CHLORIDE 0.9 % IV SOLN
510.0000 mg | Freq: Once | INTRAVENOUS | Status: AC
  Administered 2017-12-26: 510 mg via INTRAVENOUS
  Filled 2017-12-26: qty 17

## 2017-12-26 NOTE — Progress Notes (Signed)
Patient denies any concerns today.  

## 2017-12-30 ENCOUNTER — Ambulatory Visit (INDEPENDENT_AMBULATORY_CARE_PROVIDER_SITE_OTHER): Payer: PPO | Admitting: Internal Medicine

## 2017-12-30 DIAGNOSIS — F1010 Alcohol abuse, uncomplicated: Secondary | ICD-10-CM | POA: Diagnosis not present

## 2017-12-30 DIAGNOSIS — K52831 Collagenous colitis: Secondary | ICD-10-CM | POA: Diagnosis not present

## 2017-12-30 DIAGNOSIS — I712 Thoracic aortic aneurysm, without rupture, unspecified: Secondary | ICD-10-CM

## 2017-12-30 DIAGNOSIS — M549 Dorsalgia, unspecified: Secondary | ICD-10-CM | POA: Diagnosis not present

## 2017-12-30 DIAGNOSIS — D472 Monoclonal gammopathy: Secondary | ICD-10-CM | POA: Diagnosis not present

## 2017-12-30 DIAGNOSIS — K219 Gastro-esophageal reflux disease without esophagitis: Secondary | ICD-10-CM

## 2017-12-30 DIAGNOSIS — I2510 Atherosclerotic heart disease of native coronary artery without angina pectoris: Secondary | ICD-10-CM

## 2017-12-30 DIAGNOSIS — D509 Iron deficiency anemia, unspecified: Secondary | ICD-10-CM | POA: Diagnosis not present

## 2017-12-30 DIAGNOSIS — E871 Hypo-osmolality and hyponatremia: Secondary | ICD-10-CM | POA: Diagnosis not present

## 2017-12-30 DIAGNOSIS — G8929 Other chronic pain: Secondary | ICD-10-CM

## 2017-12-30 DIAGNOSIS — I4892 Unspecified atrial flutter: Secondary | ICD-10-CM

## 2017-12-30 DIAGNOSIS — J449 Chronic obstructive pulmonary disease, unspecified: Secondary | ICD-10-CM

## 2017-12-30 DIAGNOSIS — I272 Pulmonary hypertension, unspecified: Secondary | ICD-10-CM | POA: Diagnosis not present

## 2017-12-30 DIAGNOSIS — I10 Essential (primary) hypertension: Secondary | ICD-10-CM | POA: Diagnosis not present

## 2017-12-30 NOTE — Progress Notes (Signed)
Patient ID: Troy Lopez, male   DOB: Jan 06, 1941, 77 y.o.   MRN: [id number]   Subjective:    Patient ID: Troy Lopez, male    DOB: 08/27/1940, 77 y.o.   MRN: [id number]  HPI  Patient here for a scheduled follow up.  States he is doing relatively well.  Since the first of March, he has noticed some increased right side back and right hip/buttock discomfort.  Due to see ortho tomorrow.  Tries to stay active.  No chest pain.  Breathing stable.  No acid reflux.  No abdominal pain.  Bowels stable.  Sees cardiology.  States his blood pressure medication was cut in 1/2.  Feels better since decreasing the dose.  Blood pressure has been doing ok on this regimen.  He is also seeing hematology and receiving IV iron.  Discussed his alcohol intake.  Discussed the need to decrease the amount.     Past Medical History:  Diagnosis Date  . Adrenal gland anomaly    enlargement  . Anxiety   . CAD (coronary artery disease)   . Carpal tunnel syndrome   . Chronic hyponatremia   . Colitis   . Colonic polyp   . COPD (chronic obstructive pulmonary disease) ([place])   . Degenerative disc disease, lumbar   . Depression   . Diverticulosis   . Dyspnea   . GERD (gastroesophageal reflux disease)   . H/O degenerative disc disease   . Hypercholesterolemia   . Hyperkalemia   . Hyperlipidemia   . Hypertension   . Irritable bowel syndrome   . Monoclonal gammopathy   . Monoclonal gammopathy   . Neuropathy   . Personal history of tobacco use, presenting hazards to health 08/17/2015  . Sleep apnea   . Vertebral compression fracture [facility])    Past Surgical History:  Procedure Laterality Date  . BUNIONECTOMY  1989  . CARPAL TUNNEL RELEASE Left 2011   ulnar nerve sub muscular at elbow  . CHOLECYSTECTOMY  09/07  . COLONOSCOPY WITH PROPOFOL N/A 03/05/2017   Procedure: COLONOSCOPY WITH PROPOFOL;  Surgeon: Lucilla Lame, MD;  Location: [facility] ENDOSCOPY;  Service: Endoscopy;  Laterality: N/A;  . ELECTROPHYSIOLOGIC  STUDY N/A 11/15/2015   Procedure: CARDIOVERSION;  Surgeon: Isaias Cowman, MD;  Location: [facility] ORS;  Service: Cardiovascular;  Laterality: N/A;  . ESOPHAGOGASTRODUODENOSCOPY (EGD) WITH PROPOFOL N/A 03/05/2017   Procedure: ESOPHAGOGASTRODUODENOSCOPY (EGD) WITH PROPOFOL;  Surgeon: Lucilla Lame, MD;  Location: [facility] ENDOSCOPY;  Service: Endoscopy;  Laterality: N/A;  . ESOPHAGOGASTRODUODENOSCOPY (EGD) WITH PROPOFOL N/A 10/17/2017   Procedure: ESOPHAGOGASTRODUODENOSCOPY (EGD) WITH PROPOFOL;  Surgeon: Lollie Sails, MD;  Location: [facility] ENDOSCOPY;  Service: Endoscopy;  Laterality: N/A;  . EYE SURGERY Bilateral 2010   cataract  . HEMORRHOID SURGERY    . KYPHOPLASTY N/A 11/04/2016   Procedure: KYPHOPLASTY T 12;  Surgeon: Hessie Knows, MD;  Location: [facility] ORS;  Service: Orthopedics;  Laterality: N/A;   Family History  Problem Relation Age of Onset  . Stroke Mother   . Heart disease Father        MI - 54   . Colon cancer Neg Hx   . Prostate cancer Neg Hx    Social History   Socioeconomic History  . Marital status: Married    Spouse name: Not on file  . Number of children: 3  . Years of education: Not on file  . Highest education level: Not on file  Occupational History  . Not on file  Social Needs  . Financial  resource strain: Not on file  . Food insecurity:    Worry: Not on file    Inability: Not on file  . Transportation needs:    Medical: Not on file    Non-medical: Not on file  Tobacco Use  . Smoking status: Former Smoker    Types: Cigarettes    Last attempt to quit: 11/01/2015    Years since quitting: 2.1  . Smokeless tobacco: Never Used  . Tobacco comment: about 2 cigarettes per day, trying to quit  Substance and Sexual Activity  . Alcohol use: Yes    Alcohol/week: 25.2 oz    Types: 42 Cans of beer per week    Comment: occas  . Drug use: No  . Sexual activity: Not on file  Lifestyle  . Physical activity:    Days per week: Not on file    Minutes per session: Not on  file  . Stress: Not on file  Relationships  . Social connections:    Talks on phone: Not on file    Gets together: Not on file    Attends religious service: Not on file    Active member of club or organization: Not on file    Attends meetings of clubs or organizations: Not on file    Relationship status: Not on file  Other Topics Concern  . Not on file  Social History Narrative  . Not on file    Outpatient Encounter Medications as of 12/30/2017  Medication Sig  . acetaminophen (TYLENOL) 500 MG tablet Take 500-1,000 mg by mouth 3 (three) times daily as needed for moderate pain or headache.  . albuterol (PROVENTIL HFA;VENTOLIN HFA) 108 (90 Base) MCG/ACT inhaler Inhale 2 puffs into the lungs every 6 (six) hours as needed for wheezing or shortness of breath.  Marland Kitchen alendronate (FOSAMAX) 70 MG tablet Take 70 mg by mouth once a week. Take with a full glass of water on an empty stomach. Takes on Sundays  . amLODipine (NORVASC) 10 MG tablet Take 1 tablet (5 mg total) by mouth daily.  . budesonide (ENTOCORT EC) 3 MG 24 hr capsule Take 9 mg by mouth daily as needed (colitis flare).   Marland Kitchen ELIQUIS 5 MG TABS tablet Take 5 mg by mouth 2 (two) times daily.   Marland Kitchen FIBER PO Take 2 capsules by mouth daily.  . fluticasone (FLONASE) 50 MCG/ACT nasal spray Place 2 sprays into both nostrils daily.  . fluticasone furoate-vilanterol (BREO ELLIPTA) 100-25 MCG/INH AEPB Inhale 1 puff into the lungs daily.  . furosemide (LASIX) 20 MG tablet Take 1 tablet (20 mg total) by mouth daily as needed.  . gabapentin (NEURONTIN) 100 MG capsule TAKE 4 CAPSULES THREE TIMES A DAY.  . isosorbide mononitrate (IMDUR) 30 MG 24 hr tablet Take 30 mg by mouth daily.   Marland Kitchen losartan (COZAAR) 100 MG tablet Take 1 tablet (100 mg total) by mouth daily.  . metoprolol tartrate (LOPRESSOR) 100 MG tablet Take 1 tablet (100 mg total) by mouth 2 (two) times daily.  . mupirocin ointment (BACTROBAN) 2 % Apply 1 application topically 2 (two) times daily.    . ondansetron (ZOFRAN) 8 MG tablet Take by mouth every 8 (eight) hours as needed for nausea or vomiting.  . pantoprazole (PROTONIX) 40 MG tablet Take 1 tablet (40 mg total) by mouth 2 (two) times daily.  . promethazine (PHENERGAN) 12.5 MG tablet Take 12.5 mg by mouth every 6 (six) hours as needed for nausea or vomiting.  . sertraline (ZOLOFT) 50  MG tablet Take 1 tablet (50 mg total) by mouth daily.  . Tiotropium Bromide Monohydrate (SPIRIVA RESPIMAT) 1.25 MCG/ACT AERS Inhale 2.5 mcg daily into the lungs.  . vitamin B-12 (CYANOCOBALAMIN) 1000 MCG tablet Take 1,000 mcg by mouth daily.    No facility-administered encounter medications on file as of 12/30/2017.     Review of Systems  Constitutional: Negative for appetite change and unexpected weight change.  HENT: Negative for congestion and sinus pressure.   Respiratory: Negative for cough, chest tightness and shortness of breath.   Cardiovascular: Negative for chest pain, palpitations and leg swelling.  Gastrointestinal: Negative for abdominal pain, diarrhea, nausea and vomiting.  Genitourinary: Negative for difficulty urinating and dysuria.  Musculoskeletal: Negative for joint swelling and myalgias.       Back and hip pain as outlined.    Skin: Negative for color change and rash.  Neurological: Negative for dizziness, light-headedness and headaches.  Psychiatric/Behavioral: Negative for agitation and dysphoric mood.       Objective:     Blood pressure rechecked by me:  142/84  Physical Exam  Constitutional: He appears well-developed and well-nourished. No distress.  HENT:  Nose: Nose normal.  Mouth/Throat: Oropharynx is clear and moist.  Neck: Neck supple. No thyromegaly present.  Cardiovascular: Normal rate and regular rhythm.  Pulmonary/Chest: Effort normal and breath sounds normal. No respiratory distress.  Abdominal: Soft. Bowel sounds are normal. There is no tenderness.  Musculoskeletal: He exhibits no edema or tenderness.   Lymphadenopathy:    He has no cervical adenopathy.  Skin: No rash noted. No erythema.  Psychiatric: He has a normal mood and affect. His behavior is normal.    BP (!) 150/88 (BP Location: Left Arm, Patient Position: Sitting, Cuff Size: Large)   Pulse 67   Temp 98 F (36.7 C) (Oral)   Resp 18   Wt 186 lb 9.6 oz (84.6 kg)   SpO2 99%   BMI 26.03 kg/m  Wt Readings from Last 3 Encounters:  12/30/17 186 lb 9.6 oz (84.6 kg)  12/26/17 183 lb 6.8 oz (83.2 kg)  11/04/17 186 lb (84.4 kg)     Lab Results  Component Value Date   WBC 8.1 12/23/2017   HGB 10.5 (L) 12/23/2017   HCT 30.7 (L) 12/23/2017   PLT 409 12/23/2017   GLUCOSE 102 (H) 12/23/2017   CHOL 148 12/17/2017   TRIG 66.0 12/17/2017   HDL 73.90 12/17/2017   LDLCALC 61 12/17/2017   ALT 7 12/17/2017   AST 12 12/17/2017   NA 129 (L) 12/23/2017   K 4.7 12/23/2017   CL 96 (L) 12/23/2017   CREATININE 0.95 12/23/2017   BUN 16 12/23/2017   CO2 23 12/23/2017   TSH 1.75 08/18/2017   PSA 1.79 12/17/2017   INR 1.11 01/08/2017   HGBA1C 5.9 12/17/2017   MICROALBUR 1.9 10/27/2017    Ct Head Wo Contrast  Result Date: 10/30/2017 CLINICAL DATA:  Pt states fall one week ago, denies LOC. Hit head on right side and c/o headaches x 1 day, but is currently not experiencing any symptoms. EXAM: CT HEAD WITHOUT CONTRAST TECHNIQUE: Contiguous axial images were obtained from the base of the skull through the vertex without intravenous contrast. COMPARISON:  10/31/2005 FINDINGS: Brain: No evidence of acute infarction, hemorrhage, hydrocephalus, extra-axial collection or mass lesion/mass effect. There is mild ventricular and sulcal enlargement consistent with age-appropriate volume loss. Patchy white matter hypoattenuation is noted bilaterally consistent with moderate chronic microvascular ischemic change. Vascular: No hyperdense vessel or  unexpected calcification. Skull: Normal. Negative for fracture or focal lesion. Sinuses/Orbits: Globes and  orbits are unremarkable. 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 There is mild right frontal sinus mucosal thickening with a small amount of dependent fluid. Remaining visualized sinuses are clear. Clear mastoid air cells. Other: None. IMPRESSION: 1. No acute intracranial abnormalities.  No skull fracture. 2. Age-appropriate volume loss and moderate chronic microvascular ischemic change. Electronically Signed   By: Lajean Manes M.D.   On: 10/30/2017 13:57       Assessment & Plan:   Problem List Items Addressed This Visit    Alcohol abuse    Discussed again with him today regarding the need to decrease his alcohol intake.  Follow.        Atrial flutter ([place])    S/p cardioversion.  On eliquis.  Stable.  Followed by cardiology.        CAD (coronary artery disease)    Continue risk factor modification.  Stable.  Followed by cardiology.       Chronic back pain    Has seen Dr Sharlet Salina.  With increased right side back pain and pin in right hip and buttock now.  Has appt with ortho tomorrow.        Collagenous colitis    Has intermittent flares.  Overall doing better now.  Follow.        GERD (gastroesophageal reflux disease)    Controlled on current regimen.  Follow.        Hypertension    Blood pressure on recheck improved.  Has been under good control on outside checks - per his report.  Same medication regimen.  Follow pressures.  Follow metabolic panel.        Hyponatremia    Sodium decreased.   Discussed decreased alcohol intake.  Discussed eating regular meals. Has seen nephrology.  Follow sodium.        Iron deficiency anemia    Followed by hematology.        MGUS (monoclonal gammopathy of unknown significance)    Followed by hematology.  S/p bone marrow biopsy.        Moderate COPD (chronic obstructive pulmonary disease) ([facility])    Breathing stable.       Pulmonary hypertension ([place])    Followed by cardiology.       Thoracic aortic aneurysm ([facility])     Has 4 cm ascending thoracic aortic aneurysm.  Recommended f/u CT chest in one year.  Last evaluated 04/2018.            Einar Pheasant, MD

## 2017-12-31 DIAGNOSIS — G8929 Other chronic pain: Secondary | ICD-10-CM | POA: Diagnosis not present

## 2017-12-31 DIAGNOSIS — M545 Low back pain: Secondary | ICD-10-CM | POA: Diagnosis not present

## 2017-12-31 DIAGNOSIS — M4316 Spondylolisthesis, lumbar region: Secondary | ICD-10-CM | POA: Diagnosis not present

## 2017-12-31 DIAGNOSIS — M47816 Spondylosis without myelopathy or radiculopathy, lumbar region: Secondary | ICD-10-CM | POA: Diagnosis not present

## 2018-01-02 ENCOUNTER — Inpatient Hospital Stay: Payer: PPO

## 2018-01-02 VITALS — BP 115/76 | HR 65 | Temp 98.2°F | Resp 18

## 2018-01-02 DIAGNOSIS — D509 Iron deficiency anemia, unspecified: Secondary | ICD-10-CM | POA: Diagnosis not present

## 2018-01-02 MED ORDER — FERUMOXYTOL INJECTION 510 MG/17 ML
INTRAVENOUS | Status: AC
  Filled 2018-01-02: qty 17

## 2018-01-02 MED ORDER — SODIUM CHLORIDE 0.9 % IV SOLN
510.0000 mg | Freq: Once | INTRAVENOUS | Status: AC
  Administered 2018-01-02: 510 mg via INTRAVENOUS
  Filled 2018-01-02: qty 17

## 2018-01-02 MED ORDER — SODIUM CHLORIDE 0.9 % IV SOLN
Freq: Once | INTRAVENOUS | Status: AC
  Administered 2018-01-02: 14:00:00 via INTRAVENOUS
  Filled 2018-01-02: qty 1000

## 2018-01-05 DIAGNOSIS — M545 Low back pain: Secondary | ICD-10-CM | POA: Diagnosis not present

## 2018-01-05 DIAGNOSIS — G8929 Other chronic pain: Secondary | ICD-10-CM | POA: Diagnosis not present

## 2018-01-06 ENCOUNTER — Encounter: Payer: Self-pay | Admitting: Internal Medicine

## 2018-01-06 DIAGNOSIS — M4316 Spondylolisthesis, lumbar region: Secondary | ICD-10-CM | POA: Diagnosis not present

## 2018-01-06 DIAGNOSIS — M5127 Other intervertebral disc displacement, lumbosacral region: Secondary | ICD-10-CM | POA: Diagnosis not present

## 2018-01-06 DIAGNOSIS — M9903 Segmental and somatic dysfunction of lumbar region: Secondary | ICD-10-CM | POA: Diagnosis not present

## 2018-01-06 NOTE — Assessment & Plan Note (Signed)
Continue risk factor modification.  Stable.  Followed by cardiology.  

## 2018-01-06 NOTE — Assessment & Plan Note (Signed)
Has intermittent flares.  Overall doing better now.  Follow.

## 2018-01-06 NOTE — Assessment & Plan Note (Signed)
Controlled on current regimen.  Follow.  

## 2018-01-06 NOTE — Assessment & Plan Note (Signed)
Breathing stable.

## 2018-01-06 NOTE — Assessment & Plan Note (Signed)
Sodium decreased.   Discussed decreased alcohol intake.  Discussed eating regular meals. Has seen nephrology.  Follow sodium.

## 2018-01-06 NOTE — Assessment & Plan Note (Signed)
Discussed again with him today regarding the need to decrease his alcohol intake.  Follow.

## 2018-01-06 NOTE — Assessment & Plan Note (Signed)
Followed by hematology.  S/p bone marrow biopsy.

## 2018-01-06 NOTE — Assessment & Plan Note (Signed)
Has seen Dr Sharlet Salina.  With increased right side back pain and pin in right hip and buttock now.  Has appt with ortho tomorrow.

## 2018-01-06 NOTE — Assessment & Plan Note (Signed)
S/p cardioversion.  On eliquis.  Stable.  Followed by cardiology.

## 2018-01-06 NOTE — Assessment & Plan Note (Signed)
Followed by hematology 

## 2018-01-06 NOTE — Assessment & Plan Note (Signed)
Blood pressure on recheck improved.  Has been under good control on outside checks - per his report.  Same medication regimen.  Follow pressures.  Follow metabolic panel.

## 2018-01-06 NOTE — Assessment & Plan Note (Signed)
Followed by cardiology 

## 2018-01-06 NOTE — Assessment & Plan Note (Signed)
Has 4cm ascending thoracic aortic aneurysm.  Recommended f/u CT chest in one year.  Last evaluated 04/2018.   

## 2018-01-07 DIAGNOSIS — M810 Age-related osteoporosis without current pathological fracture: Secondary | ICD-10-CM | POA: Diagnosis not present

## 2018-01-07 DIAGNOSIS — M4316 Spondylolisthesis, lumbar region: Secondary | ICD-10-CM | POA: Diagnosis not present

## 2018-01-07 DIAGNOSIS — Z8781 Personal history of (healed) traumatic fracture: Secondary | ICD-10-CM | POA: Diagnosis not present

## 2018-01-07 DIAGNOSIS — M5127 Other intervertebral disc displacement, lumbosacral region: Secondary | ICD-10-CM | POA: Diagnosis not present

## 2018-01-07 DIAGNOSIS — M9903 Segmental and somatic dysfunction of lumbar region: Secondary | ICD-10-CM | POA: Diagnosis not present

## 2018-01-13 ENCOUNTER — Ambulatory Visit: Payer: PPO | Admitting: Pulmonary Disease

## 2018-01-13 DIAGNOSIS — M4316 Spondylolisthesis, lumbar region: Secondary | ICD-10-CM | POA: Diagnosis not present

## 2018-01-13 DIAGNOSIS — M5127 Other intervertebral disc displacement, lumbosacral region: Secondary | ICD-10-CM | POA: Diagnosis not present

## 2018-01-13 DIAGNOSIS — M9903 Segmental and somatic dysfunction of lumbar region: Secondary | ICD-10-CM | POA: Diagnosis not present

## 2018-01-15 DIAGNOSIS — M9903 Segmental and somatic dysfunction of lumbar region: Secondary | ICD-10-CM | POA: Diagnosis not present

## 2018-01-15 DIAGNOSIS — M4316 Spondylolisthesis, lumbar region: Secondary | ICD-10-CM | POA: Diagnosis not present

## 2018-01-15 DIAGNOSIS — M5127 Other intervertebral disc displacement, lumbosacral region: Secondary | ICD-10-CM | POA: Diagnosis not present

## 2018-01-19 DIAGNOSIS — M5127 Other intervertebral disc displacement, lumbosacral region: Secondary | ICD-10-CM | POA: Diagnosis not present

## 2018-01-19 DIAGNOSIS — M9903 Segmental and somatic dysfunction of lumbar region: Secondary | ICD-10-CM | POA: Diagnosis not present

## 2018-01-19 DIAGNOSIS — M4316 Spondylolisthesis, lumbar region: Secondary | ICD-10-CM | POA: Diagnosis not present

## 2018-01-20 ENCOUNTER — Other Ambulatory Visit: Payer: Self-pay | Admitting: *Deleted

## 2018-01-20 ENCOUNTER — Encounter: Payer: Self-pay | Admitting: Pulmonary Disease

## 2018-01-20 ENCOUNTER — Ambulatory Visit: Payer: PPO | Admitting: Pulmonary Disease

## 2018-01-20 VITALS — BP 138/84 | HR 61 | Ht 71.0 in | Wt 184.0 lb

## 2018-01-20 DIAGNOSIS — J449 Chronic obstructive pulmonary disease, unspecified: Secondary | ICD-10-CM

## 2018-01-20 DIAGNOSIS — F17200 Nicotine dependence, unspecified, uncomplicated: Secondary | ICD-10-CM

## 2018-01-20 MED ORDER — FLUTICASONE FUROATE-VILANTEROL 100-25 MCG/INH IN AEPB: 1.0000 | INHALATION_SPRAY | Freq: Every day | RESPIRATORY_TRACT | 3 refills | Status: DC

## 2018-01-20 NOTE — Patient Instructions (Signed)
Counseled again regarding need for smoking cessation Continue Breo inhaler for now Continue albuterol inhaler as needed Follow-up in 2 to 3 months with repeat chest x-ray and lung function tests

## 2018-01-21 ENCOUNTER — Telehealth: Payer: Self-pay | Admitting: Pulmonary Disease

## 2018-01-21 DIAGNOSIS — M4316 Spondylolisthesis, lumbar region: Secondary | ICD-10-CM | POA: Diagnosis not present

## 2018-01-21 DIAGNOSIS — D509 Iron deficiency anemia, unspecified: Secondary | ICD-10-CM | POA: Diagnosis not present

## 2018-01-21 DIAGNOSIS — M9903 Segmental and somatic dysfunction of lumbar region: Secondary | ICD-10-CM | POA: Diagnosis not present

## 2018-01-21 DIAGNOSIS — M5127 Other intervertebral disc displacement, lumbosacral region: Secondary | ICD-10-CM | POA: Diagnosis not present

## 2018-01-21 NOTE — Telephone Encounter (Signed)
Pt calling stating he saw on Mychart that he is scheduled for a PFT and FU the end of July But on his AVS it states he's not to do those until September of this year.   He would like a call back about this.   Please advise

## 2018-01-21 NOTE — Progress Notes (Signed)
PULMONARY OFFICE FOLLOW UP NOTE  Requesting MD/Service: Einar Pheasant, MD Date of initial consultation: 09/18/15 Reason for consultation: Dyspnea  [name] smoker underwent initial evaluation for progressive dyspnea on 09/18/15 with finding of cardiac arrhythmia - probable atrial flutter with variable conduction and frequent PVCs. Initial impression: Likely COPD. Acute worsening of respiratory symptoms possibly due to cardiac arrhythmia. Case discussed with Dr Saralyn Pilar. Trial of Incruse initiated  Data: NM stress 08/17/15: 1. Equivocal ETT. 2. Normal left ventricular function. 3. Normal wall motion. 4. No evidence for scar or ischemia TTE 08/02/15: LVEF 50%, modertate LVH. Mod MR. RVSP est 57 mmHg. LA moderately dilated 09/22/15 PFTs: mild-mod obstruction. Normal TLC. Severe reduction DLCO 10/09/15 ambulatory oximetry: no desaturation.  11/15/15 Successful DCCV ([place]) 03/26/17 CT chest: Emphysematous and bronchitic changes consistent with COPD with progressive interstitial lung disease changes at the upper lobes bilaterally  INTERVAL: Last seen 06/24/2017.  No major events   SUBJ: This is a scheduled ROV.  There is little change from previously.  He continues to smoke "off and on".  He will go 2 or 3 days without cigarettes and then by a pack and smoke it over the next 3 to 4 days.  He remains on Breo inhaler.  He did not feel that Spiriva provided much benefit.  He is not using his albuterol rescue inhaler at all.  He has run out of his Breo inhaler for the past 2 to 3 days and states that he "can tell".  He denies CP, fever, purulent sputum, hemoptysis, LE edema and calf tenderness.   Vitals:   01/20/18 0954 01/20/18 0958  BP:  138/84  Pulse:  61  SpO2:  96%  Weight: 184 lb (83.5 kg)   Height: 5\' 11"  (1.803 m)      EXAM:  Gen: NAD HEENT: NCAT, sclera white Neck: No JVD Lungs: breath sounds mildly diminished, no wheezes or other adventitious  sounds Cardiovascular: RRR, no murmurs Abdomen: Soft, nontender, normal BS Ext: without clubbing, cyanosis, edema Neuro: grossly intact Skin: Limited exam, no lesions noted   DATA:  BMP Latest Ref Rng & Units 12/23/2017 12/17/2017 11/19/2017  Glucose 65 - 99 mg/dL 102(H) 77 -  BUN 6 - 20 mg/dL 16 11 -  Creatinine 0.61 - 1.24 mg/dL 0.95 0.96 -  Sodium 135 - 145 mmol/L 129(L) 130(L) 132(L)  Potassium 3.5 - 5.1 mmol/L 4.7 5.0 -  Chloride 101 - 111 mmol/L 96(L) 97 -  CO2 22 - 32 mmol/L 23 25 -  Calcium 8.9 - 10.3 mg/dL 8.9 9.8 -    CBC Latest Ref Rng & Units 12/23/2017 12/17/2017 10/27/2017  WBC 3.8 - 10.6 K/uL 8.1 6.3 7.7  Hemoglobin 13.0 - 18.0 g/dL 10.5(L) 11.0(L) 11.6(L)  Hematocrit 40.0 - 52.0 % 30.7(L) 32.3(L) 33.8(L)  Platelets 150 - 440 K/uL 409 408.0(H) 371.0    CXR 10/27/17: Chronic interstitial prominence  IMPRESSION:   Recalcitrant smoker Moderate COPD  PLAN:  Counseled again regarding need for smoking cessation Continue Breo inhaler for now Continue albuterol inhaler as needed Follow-up in 2 to 3 months with repeat chest x-ray and lung function tests  Merton Border, MD PCCM service Mobile [phone] Pager [phone] 01/21/2018 4:19 PM

## 2018-01-21 NOTE — Telephone Encounter (Signed)
Both PFT and follow up were scheduled too soon. I have cancelled f/u with Dr. Alva Garnet and given patient the # to reschedule PFT for end of August. Pt should get recall letter re: Sept appt. Nothing further needed.

## 2018-01-27 DIAGNOSIS — D509 Iron deficiency anemia, unspecified: Secondary | ICD-10-CM | POA: Diagnosis not present

## 2018-02-04 DIAGNOSIS — M545 Low back pain: Secondary | ICD-10-CM | POA: Diagnosis not present

## 2018-02-09 DIAGNOSIS — M545 Low back pain: Secondary | ICD-10-CM | POA: Diagnosis not present

## 2018-02-11 DIAGNOSIS — M545 Low back pain: Secondary | ICD-10-CM | POA: Diagnosis not present

## 2018-02-16 DIAGNOSIS — M545 Low back pain: Secondary | ICD-10-CM | POA: Diagnosis not present

## 2018-02-18 ENCOUNTER — Emergency Department
Admission: EM | Admit: 2018-02-18 | Discharge: 2018-02-18 | Disposition: A | Discharge status: Home / Self Care | Payer: PPO | Attending: Emergency Medicine | Admitting: Emergency Medicine

## 2018-02-18 DIAGNOSIS — J449 Chronic obstructive pulmonary disease, unspecified: Secondary | ICD-10-CM | POA: Diagnosis not present

## 2018-02-18 DIAGNOSIS — I10 Essential (primary) hypertension: Secondary | ICD-10-CM | POA: Insufficient documentation

## 2018-02-18 DIAGNOSIS — Z87891 Personal history of nicotine dependence: Secondary | ICD-10-CM | POA: Insufficient documentation

## 2018-02-18 DIAGNOSIS — I2510 Atherosclerotic heart disease of native coronary artery without angina pectoris: Secondary | ICD-10-CM | POA: Insufficient documentation

## 2018-02-18 DIAGNOSIS — R55 Syncope and collapse: Secondary | ICD-10-CM | POA: Diagnosis not present

## 2018-02-18 LAB — URINALYSIS, COMPLETE (UACMP) WITH MICROSCOPIC
Bacteria, UA: NONE SEEN
Bilirubin Urine: NEGATIVE
GLUCOSE, UA: NEGATIVE mg/dL
Hgb urine dipstick: NEGATIVE
KETONES UR: NEGATIVE mg/dL
Leukocytes, UA: NEGATIVE
Nitrite: NEGATIVE
PROTEIN: NEGATIVE mg/dL
Specific Gravity, Urine: 1.008 (ref 1.005–1.030)
WBC UA: NONE SEEN WBC/hpf (ref 0–5)
pH: 5 (ref 5.0–8.0)

## 2018-02-18 LAB — COMPREHENSIVE METABOLIC PANEL
ALK PHOS: 48 U/L (ref 38–126)
ALT: 11 U/L (ref 0–44)
AST: 21 U/L (ref 15–41)
Albumin: 3.3 g/dL — ABNORMAL LOW (ref 3.5–5.0)
Anion gap: 8 (ref 5–15)
BUN: 18 mg/dL (ref 8–23)
CALCIUM: 9.2 mg/dL (ref 8.9–10.3)
CO2: 21 mmol/L — AB (ref 22–32)
CREATININE: 1.18 mg/dL (ref 0.61–1.24)
Chloride: 100 mmol/L (ref 98–111)
GFR calc Af Amer: >60 mL/min (ref >60)
GFR calc non Af Amer: 59 mL/min — ABNORMAL LOW (ref >60)
Glucose, Bld: 102 mg/dL — ABNORMAL HIGH (ref 70–99)
Potassium: 4.5 mmol/L (ref 3.5–5.1)
SODIUM: 129 mmol/L — AB (ref 135–145)
Total Bilirubin: 0.7 mg/dL (ref 0.3–1.2)
Total Protein: 7.9 g/dL (ref 6.5–8.1)

## 2018-02-18 LAB — CBC WITH DIFFERENTIAL/PLATELET
BASOS ABS: 0.1 10*3/uL (ref 0–0.1)
Basophils Relative: 1 %
EOS ABS: 0.1 10*3/uL (ref 0–0.7)
Eosinophils Relative: 1 %
HCT: 35.5 % — ABNORMAL LOW (ref 40.0–52.0)
HEMOGLOBIN: 12.3 g/dL — AB (ref 13.0–18.0)
LYMPHS ABS: 2 10*3/uL (ref 1.0–3.6)
LYMPHS PCT: 24 %
MCH: 36.6 pg — AB (ref 26.0–34.0)
MCHC: 34.8 g/dL (ref 32.0–36.0)
MCV: 105.1 fL — AB (ref 80.0–100.0)
Monocytes Absolute: 0.9 10*3/uL (ref 0.2–1.0)
Monocytes Relative: 11 %
NEUTROS PCT: 63 %
Neutro Abs: 5.2 10*3/uL (ref 1.4–6.5)
Platelets: 405 10*3/uL (ref 150–440)
RBC: 3.38 MIL/uL — AB (ref 4.40–5.90)
RDW: 16.3 % — ABNORMAL HIGH (ref 11.5–14.5)
WBC: 8.3 10*3/uL (ref 3.8–10.6)

## 2018-02-18 LAB — TROPONIN I: Troponin I: <0.03 ng/mL (ref <0.03)

## 2018-02-18 MED ORDER — SODIUM CHLORIDE 0.9 % IV SOLN
Freq: Once | INTRAVENOUS | Status: AC
  Administered 2018-02-18: 11:00:00 via INTRAVENOUS

## 2018-02-18 NOTE — ED Notes (Signed)
Pt resting. Respirations even and unlabored. NAD. Stretcher in low and locked position. Call bell in reach. Denies needs at this time. Family at bedside. Will continue to monitor. 

## 2018-02-18 NOTE — ED Provider Notes (Signed)
[facility] Emergency Department Provider Note       Time seen: ----------------------------------------- 10:37 AM on 02/18/2018 -----------------------------------------   I have reviewed the triage vital signs and the nursing notes.  HISTORY   Chief Complaint No chief complaint on file.   HPI Troy Lopez is a 77 y.o. male with a history of coronary artery disease, COPD, depression, GERD, hyper lipidemia, hypertension, neuropathy who presents to the ED for near syncope.  Patient was a same-day surgery with a family member who is being prepped for surgery.  Patient then had a near syncopal event where his blood pressure dropped to around 58 systolic.  He states he has had a history of this before, recently has had his 3 blood pressure medications decreased.  He did not fall or hit his head.  Currently he feels back to normal.  He denies any recent illness or other complaints.  Past Medical History:  Diagnosis Date  . Adrenal gland anomaly    enlargement  . Anxiety   . CAD (coronary artery disease)   . Carpal tunnel syndrome   . Chronic hyponatremia   . Colitis   . Colonic polyp   . COPD (chronic obstructive pulmonary disease) ([place])   . Degenerative disc disease, lumbar   . Depression   . Diverticulosis   . Dyspnea   . GERD (gastroesophageal reflux disease)   . H/O degenerative disc disease   . Hypercholesterolemia   . Hyperkalemia   . Hyperlipidemia   . Hypertension   . Irritable bowel syndrome   . Monoclonal gammopathy   . Monoclonal gammopathy   . Neuropathy   . Personal history of tobacco use, presenting hazards to health 08/17/2015  . Sleep apnea   . Vertebral compression fracture [facility])     Patient Active Problem List   Diagnosis Date Noted  . Constipation 10/29/2017  . Fall 10/29/2017  . Rib pain on right side 10/29/2017  . Pulmonary hypertension ([place]) 05/02/2017  . Atrial flutter ([place]) 05/02/2017  . Thoracic aortic aneurysm  ([place]) 04/29/2017  . Abnormal feces   . Rectal polyp   . Chest pain 03/03/2017  . Blood in stool   . 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 Alcohol abuse 01/30/2017  . History of compression fracture of spine 12/08/2016  . Moderate COPD (chronic obstructive pulmonary disease) ([place]) 07/22/2016  . COPD exacerbation ([place]) 07/22/2016  . Depression 07/21/2016  . Skin lesion 01/21/2016  . Sleep difficulties 09/26/2015  . Personal history of tobacco use, presenting hazards to health 08/17/2015  . Health care maintenance 10/23/2014  . Collagenous colitis 09/16/2014  . Leg pain 03/20/2014  . Hyperlipemia 12/22/2013  . SOB (shortness of breath) 11/21/2013  . Ulcer 02/02/2013  . Chronic back pain 02/02/2013  . GERD (gastroesophageal reflux disease) 09/13/2012  . Sleep apnea 09/13/2012  . Iron deficiency anemia 09/08/2012  . 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 CAD (coronary artery disease) 09/08/2012  . Hypertension 09/08/2012  . Hyponatremia 09/08/2012    Past Surgical History:  Procedure Laterality Date  . BUNIONECTOMY  1989  . CARPAL TUNNEL RELEASE Left 2011   ulnar nerve sub muscular at elbow  . CHOLECYSTECTOMY  09/07  . COLONOSCOPY WITH PROPOFOL N/A 03/05/2017   Procedure: COLONOSCOPY WITH PROPOFOL;  Surgeon: Lucilla Lame, MD;  [facility] ENDOSCOPY;  Service: Endoscopy;  Laterality: N/A;  . ELECTROPHYSIOLOGIC STUDY N/A 11/15/2015   Procedure: CARDIOVERSION;  Surgeon: Isaias Cowman, MD;  Location: [facility] ORS;  Service:  Cardiovascular;  Laterality: N/A;  . ESOPHAGOGASTRODUODENOSCOPY (EGD) WITH PROPOFOL N/A 03/05/2017   Procedure: ESOPHAGOGASTRODUODENOSCOPY (EGD) WITH PROPOFOL;  Surgeon: Lucilla Lame, MD;  Location: [facility] ENDOSCOPY;  Service: Endoscopy;  Laterality: N/A;  . ESOPHAGOGASTRODUODENOSCOPY (EGD) WITH PROPOFOL N/A 10/17/2017   Procedure: ESOPHAGOGASTRODUODENOSCOPY (EGD) WITH PROPOFOL;  Surgeon: Lollie Sails, MD;  Location:  [facility] ENDOSCOPY;  Service: Endoscopy;  Laterality: N/A;  . EYE SURGERY Bilateral 2010   cataract  . HEMORRHOID SURGERY    . KYPHOPLASTY N/A 11/04/2016   Procedure: KYPHOPLASTY T 12;  Surgeon: Hessie Knows, MD;  Location: [facility] ORS;  Service: Orthopedics;  Laterality: N/A;    Allergies Iodinated diagnostic agents  Social History Social History   Tobacco Use  . Smoking status: Former Smoker    Types: Cigarettes    Last attempt to quit: 11/01/2015    Years since quitting: 2.3  . Smokeless tobacco: Never Used  . Tobacco comment: about 2 cigarettes per day, trying to quit  Substance Use Topics  . Alcohol use: Yes    Alcohol/week: 25.2 oz    Types: 42 Cans of beer per week    Comment: occas  . Drug use: No   Review of Systems Constitutional: Negative for fever. Cardiovascular: Negative for chest pain. Respiratory: Negative for shortness of breath. Gastrointestinal: Negative for abdominal pain, vomiting and diarrhea. Musculoskeletal: Negative for back pain. Skin: Negative for rash. Neurological: Negative for headaches, focal weakness or numbness.  All systems negative/normal/unremarkable except as stated in the HPI  ____________________________________________   PHYSICAL EXAM:  VITAL SIGNS: ED Triage Vitals  Enc Vitals Group     BP      Pulse      Resp      Temp      Temp src      SpO2      Weight      Height      Head Circumference      Peak Flow      Pain Score      Pain Loc      Pain Edu?      Excl. in [place]?    Constitutional: Alert and oriented. Well appearing and in no distress. Eyes: Conjunctivae are normal. Normal extraocular movements. ENT   Head: Normocephalic and atraumatic.   Nose: No congestion/rhinnorhea.   Mouth/Throat: Mucous membranes are moist.   Neck: No stridor. Cardiovascular: Normal rate, regular rhythm. No murmurs, rubs, or gallops. Respiratory: Normal respiratory effort without tachypnea nor retractions. Breath sounds are  clear and equal bilaterally. No wheezes/rales/rhonchi. Gastrointestinal: Soft and nontender. Normal bowel sounds Musculoskeletal: Nontender with normal range of motion in extremities. No lower extremity tenderness nor edema. Neurologic:  Normal speech and language. No gross focal neurologic deficits are appreciated.  Skin:  Skin is warm, dry and intact. No rash noted. Psychiatric: Mood and affect are normal. Speech and behavior are normal.  ____________________________________________  EKG: Interpreted by me.  Sinus rhythm rate 55 bpm, right bundle branch block, normal axis, normal QT  ____________________________________________  ED COURSE:  As part of my medical decision making, I reviewed the following data within the [place] History obtained from family if available, nursing notes, old chart and ekg, as well as notes from prior ED visits. Patient presented for near syncope, we will assess with labs as indicated.   Procedures ____________________________________________   LABS (pertinent positives/negatives)  Labs Reviewed  CBC WITH DIFFERENTIAL/PLATELET - Abnormal; Notable for the following components:      Result  Value   RBC 3.38 (*)    Hemoglobin 12.3 (*)    HCT 35.5 (*)    MCV 105.1 (*)    MCH 36.6 (*)    RDW 16.3 (*)    All other components within normal limits  COMPREHENSIVE METABOLIC PANEL - Abnormal; Notable for the following components:   Sodium 129 (*)    CO2 21 (*)    Glucose, Bld 102 (*)    Albumin 3.3 (*)    GFR calc non Af Amer 59 (*)    All other components within normal limits  URINALYSIS, COMPLETE (UACMP) WITH MICROSCOPIC - Abnormal; Notable for the following components:   Color, Urine YELLOW (*)    APPearance CLEAR (*)    All other components within normal limits  TROPONIN I  CBG MONITORING, ED   ____________________________________________  DIFFERENTIAL DIAGNOSIS   Medication side effect, dehydration, electrolyte  abnormality, arrhythmia, vasovagal  FINAL ASSESSMENT AND PLAN  Near syncope   Plan: The patient had presented for near syncope while visiting someone in the hospital. Patient's labs do not indicate any acute process.  Patient does not require any imaging.  He was given a liter fluids and advised him to stop his Norvasc.  He is cleared for outpatient follow-up.   Laurence Aly, MD   Note: This note was generated in part or whole with voice recognition software. Voice recognition is usually quite accurate but there are transcription errors that can and very often do occur. I apologize for any typographical errors that were not detected and corrected.     Earleen Newport, MD 02/18/18 1325

## 2018-02-18 NOTE — ED Triage Notes (Signed)
Pt presented today from same day surgery after having a syncopal episode while his family member was getting prepped for surgery. His BP there was 58/14 but pt is now alert and oriented x4 with BP of 110/89. He remembers feeling lightheaded before passing out. He did not fall or hit his head. He is on 3 different BP meds.

## 2018-02-18 NOTE — ED Notes (Signed)
Pt resting. States that he is ready to leave. Informed him that the doctor will be back to see him. Respirations even and unlabored. NAD. Stretcher in low and locked position. Call bell in reach. Denies needs at this time. Will continue to monitor.

## 2018-02-23 ENCOUNTER — Other Ambulatory Visit: Payer: Self-pay | Admitting: Internal Medicine

## 2018-02-23 ENCOUNTER — Telehealth: Payer: Self-pay | Admitting: *Deleted

## 2018-02-23 DIAGNOSIS — M545 Low back pain: Secondary | ICD-10-CM | POA: Diagnosis not present

## 2018-02-23 NOTE — Telephone Encounter (Signed)
Copied from [place] [phone]. Topic: Quick Communication - See Telephone Encounter >> Feb 20, 2018  4:55 PM Antonieta Iba C wrote: CRM for notification. See Telephone encounter for: 02/20/18.  Pt need to schedule a ED follow up with PCP. Not showing any available times to schedule. Please follow up with pt to schedule.    CB: [phone] OR Home # on file.

## 2018-02-23 NOTE — Telephone Encounter (Signed)
Scheduled for 02/25/18

## 2018-02-24 DIAGNOSIS — M5416 Radiculopathy, lumbar region: Secondary | ICD-10-CM | POA: Diagnosis not present

## 2018-02-24 DIAGNOSIS — R29898 Other symptoms and signs involving the musculoskeletal system: Secondary | ICD-10-CM | POA: Diagnosis not present

## 2018-02-25 ENCOUNTER — Encounter: Payer: Self-pay | Admitting: Internal Medicine

## 2018-02-25 ENCOUNTER — Ambulatory Visit (INDEPENDENT_AMBULATORY_CARE_PROVIDER_SITE_OTHER): Payer: PPO | Admitting: Internal Medicine

## 2018-02-25 DIAGNOSIS — I4892 Unspecified atrial flutter: Secondary | ICD-10-CM

## 2018-02-25 DIAGNOSIS — K52831 Collagenous colitis: Secondary | ICD-10-CM

## 2018-02-25 DIAGNOSIS — M549 Dorsalgia, unspecified: Secondary | ICD-10-CM

## 2018-02-25 DIAGNOSIS — E871 Hypo-osmolality and hyponatremia: Secondary | ICD-10-CM

## 2018-02-25 DIAGNOSIS — I2510 Atherosclerotic heart disease of native coronary artery without angina pectoris: Secondary | ICD-10-CM

## 2018-02-25 DIAGNOSIS — G8929 Other chronic pain: Secondary | ICD-10-CM | POA: Diagnosis not present

## 2018-02-25 DIAGNOSIS — I10 Essential (primary) hypertension: Secondary | ICD-10-CM

## 2018-02-25 DIAGNOSIS — R55 Syncope and collapse: Secondary | ICD-10-CM | POA: Diagnosis not present

## 2018-02-25 DIAGNOSIS — K219 Gastro-esophageal reflux disease without esophagitis: Secondary | ICD-10-CM | POA: Diagnosis not present

## 2018-02-25 NOTE — Progress Notes (Signed)
Patient ID: Troy Lopez, male   DOB: [birth year], 77 y.o.   MRN: [id number]

## 2018-02-25 NOTE — Progress Notes (Signed)
Patient ID: Troy Lopez, male   DOB: Jul 08, 1941, 77 y.o.   MRN: [id number]   Subjective:    Patient ID: Troy Lopez, male    DOB: May 30, 1941, 77 y.o.   MRN: [id number]  HPI  Patient here for an ER follow up.  Was evaluated 02/18/18 for syncope.  Was at same day surgery with his wife.  Felt funny.  States he got the feeling similar to previous feeling he has had when he has a near syncopal episode.  Evaluated.  Blood pressure - systolic - 58.  No fall and did not hit head.  To ER.  In ER, felt back to his normal.  Labs unrevealing.  Stable sodium.  Given a liter of fluid.  Advised to stop his norvasc.  His has been off amlodipine since.  States blood pressures averaging 140s.  No chest pain.  No sob.  No acid reflux.  No abdominal pain.  Bowels moving.  Increased pain right lower extremity.  Saw neurosurgery.  On gabapentin.  Planning for MRI.     Past Medical History:  Diagnosis Date  . Adrenal gland anomaly    enlargement  . Anxiety   . CAD (coronary artery disease)   . Carpal tunnel syndrome   . Chronic hyponatremia   . Colitis   . Colonic polyp   . COPD (chronic obstructive pulmonary disease) ([place])   . Degenerative disc disease, lumbar   . Depression   . Diverticulosis   . Dyspnea   . GERD (gastroesophageal reflux disease)   . H/O degenerative disc disease   . Hypercholesterolemia   . Hyperkalemia   . Hyperlipidemia   . Hypertension   . Irritable bowel syndrome   . Monoclonal gammopathy   . Monoclonal gammopathy   . Neuropathy   . Personal history of tobacco use, presenting hazards to health 08/17/2015  . Sleep apnea   . Vertebral compression fracture [facility])    Past Surgical History:  Procedure Laterality Date  . BUNIONECTOMY  1989  . CARPAL TUNNEL RELEASE Left 2011   ulnar nerve sub muscular at elbow  . CHOLECYSTECTOMY  09/07  . COLONOSCOPY WITH PROPOFOL N/A 03/05/2017   Procedure: COLONOSCOPY WITH PROPOFOL;  Surgeon: Lucilla Lame, MD;  Location: [facility] ENDOSCOPY;   Service: Endoscopy;  Laterality: N/A;  . ELECTROPHYSIOLOGIC STUDY N/A 11/15/2015   Procedure: CARDIOVERSION;  Surgeon: Isaias Cowman, MD;  Location: [facility] ORS;  Service: Cardiovascular;  Laterality: N/A;  . ESOPHAGOGASTRODUODENOSCOPY (EGD) WITH PROPOFOL N/A 03/05/2017   Procedure: ESOPHAGOGASTRODUODENOSCOPY (EGD) WITH PROPOFOL;  Surgeon: Lucilla Lame, MD;  Location: [facility] ENDOSCOPY;  Service: Endoscopy;  Laterality: N/A;  . ESOPHAGOGASTRODUODENOSCOPY (EGD) WITH PROPOFOL N/A 10/17/2017   Procedure: ESOPHAGOGASTRODUODENOSCOPY (EGD) WITH PROPOFOL;  Surgeon: Lollie Sails, MD;  Location: [facility] ENDOSCOPY;  Service: Endoscopy;  Laterality: N/A;  . EYE SURGERY Bilateral 2010   cataract  . HEMORRHOID SURGERY    . KYPHOPLASTY N/A 11/04/2016   Procedure: KYPHOPLASTY T 12;  Surgeon: Hessie Knows, MD;  Location: [facility] ORS;  Service: Orthopedics;  Laterality: N/A;   Family History  Problem Relation Age of Onset  . Stroke Mother   . Heart disease Father        MI - 57   . Colon cancer Neg Hx   . Prostate cancer Neg Hx    Social History   Socioeconomic History  . Marital status: Married    Spouse name: Not on file  . Number of children: 3  . Years of education: Not  on file  . Highest education level: Not on file  Occupational History  . Not on file  Social Needs  . Financial resource strain: Not on file  . Food insecurity:    Worry: Not on file    Inability: Not on file  . Transportation needs:    Medical: Not on file    Non-medical: Not on file  Tobacco Use  . Smoking status: Former Smoker    Types: Cigarettes    Last attempt to quit: 11/01/2015    Years since quitting: 2.3  . Smokeless tobacco: Never Used  . Tobacco comment: about 2 cigarettes per day, trying to quit  Substance and Sexual Activity  . Alcohol use: Yes    Alcohol/week: 25.2 oz    Types: 42 Cans of beer per week    Comment: occas  . Drug use: No  . Sexual activity: Not on file  Lifestyle  . Physical activity:      Days per week: Not on file    Minutes per session: Not on file  . Stress: Not on file  Relationships  . Social connections:    Talks on phone: Not on file    Gets together: Not on file    Attends religious service: Not on file    Active member of club or organization: Not on file    Attends meetings of clubs or organizations: Not on file    Relationship status: Not on file  Other Topics Concern  . Not on file  Social History Narrative  . Not on file    Outpatient Encounter Medications as of 02/25/2018  Medication Sig  . acetaminophen (TYLENOL) 500 MG tablet Take 500-1,000 mg by mouth 3 (three) times daily as needed for moderate pain or headache.  . albuterol (PROVENTIL HFA;VENTOLIN HFA) 108 (90 Base) MCG/ACT inhaler Inhale 2 puffs into the lungs every 6 (six) hours as needed for wheezing or shortness of breath.  Marland Kitchen alendronate (FOSAMAX) 70 MG tablet Take 70 mg by mouth once a week. Take with a full glass of water on an empty stomach. Takes on Sundays  . budesonide (ENTOCORT EC) 3 MG 24 hr capsule Take 9 mg by mouth daily as needed (colitis flare).   Marland Kitchen ELIQUIS 5 MG TABS tablet Take 5 mg by mouth 2 (two) times daily.   Marland Kitchen FIBER PO Take 2 capsules by mouth daily.  . fluticasone (FLONASE) 50 MCG/ACT nasal spray Place 2 sprays into both nostrils daily.  . fluticasone furoate-vilanterol (BREO ELLIPTA) 100-25 MCG/INH AEPB Inhale 1 puff into the lungs daily.  . furosemide (LASIX) 20 MG tablet Take 1 tablet (20 mg total) by mouth daily as needed.  . gabapentin (NEURONTIN) 100 MG capsule TAKE 4 CAPSULES THREE TIMES A DAY.  . isosorbide mononitrate (IMDUR) 30 MG 24 hr tablet Take 30 mg by mouth daily.   Marland Kitchen losartan (COZAAR) 100 MG tablet Take 1 tablet (100 mg total) by mouth daily.  . metoprolol tartrate (LOPRESSOR) 100 MG tablet Take 1 tablet (100 mg total) by mouth 2 (two) times daily.  . mupirocin ointment (BACTROBAN) 2 % Apply 1 application topically 2 (two) times daily.  . pantoprazole  (PROTONIX) 40 MG tablet Take 1 tablet (40 mg total) by mouth 2 (two) times daily.  . promethazine (PHENERGAN) 12.5 MG tablet Take 12.5 mg by mouth every 6 (six) hours as needed for nausea or vomiting.  . Tiotropium Bromide Monohydrate (SPIRIVA RESPIMAT) 1.25 MCG/ACT AERS Inhale 2.5 mcg daily into the lungs.  Marland Kitchen  vitamin B-12 (CYANOCOBALAMIN) 1000 MCG tablet Take 1,000 mcg by mouth daily.   . [DISCONTINUED] sertraline (ZOLOFT) 50 MG tablet Take 1 tablet (50 mg total) by mouth daily.  Marland Kitchen amLODipine (NORVASC) 10 MG tablet Take 1 tablet (5 mg total) by mouth daily. (Patient not taking: Reported on 02/25/2018)   No facility-administered encounter medications on file as of 02/25/2018.     Review of Systems  Constitutional: Negative for appetite change and unexpected weight change.  HENT: Negative for congestion and sinus pressure.   Respiratory: Negative for cough, chest tightness and shortness of breath.   Cardiovascular: Negative for chest pain, palpitations and leg swelling.  Gastrointestinal: Negative for abdominal pain, diarrhea, nausea and vomiting.  Genitourinary: Negative for difficulty urinating and dysuria.  Musculoskeletal: Negative for joint swelling and myalgias.       Right leg pain as outlined.    Skin: Negative for color change and rash.  Neurological: Negative for dizziness, light-headedness and headaches.  Psychiatric/Behavioral: Negative for agitation and dysphoric mood.       Objective:     Blood pressure 134-136/74-76.  Not orthostatic on exam.   Physical Exam  Constitutional: He appears well-developed and well-nourished. No distress.  HENT:  Nose: Nose normal.  Mouth/Throat: Oropharynx is clear and moist.  Neck: Neck supple. No thyromegaly present.  Cardiovascular: Normal rate and regular rhythm.  Pulmonary/Chest: Effort normal and breath sounds normal. No respiratory distress.  Abdominal: Soft. Bowel sounds are normal. There is no tenderness.  Musculoskeletal: He  exhibits no edema or tenderness.  Lymphadenopathy:    He has no cervical adenopathy.  Skin: No rash noted. No erythema.  Psychiatric: He has a normal mood and affect. His behavior is normal.    BP 116/62 (BP Location: Right Arm, Patient Position: Sitting, Cuff Size: Normal)   Pulse 60   Ht 5\' 10"  (1.778 m)   Wt 186 lb 12.8 oz (84.7 kg)   SpO2 93%   BMI 26.80 kg/m  Wt Readings from Last 3 Encounters:  02/25/18 186 lb 12.8 oz (84.7 kg)  02/18/18 184 lb 1.4 oz (83.5 kg)  01/20/18 184 lb (83.5 kg)     Lab Results  Component Value Date   WBC 8.3 02/18/2018   HGB 12.3 (L) 02/18/2018   HCT 35.5 (L) 02/18/2018   PLT 405 02/18/2018   GLUCOSE 102 (H) 02/18/2018   CHOL 148 12/17/2017   TRIG 66.0 12/17/2017   HDL 73.90 12/17/2017   LDLCALC 61 12/17/2017   ALT 11 02/18/2018   AST 21 02/18/2018   NA 129 (L) 02/18/2018   K 4.5 02/18/2018   CL 100 02/18/2018   CREATININE 1.18 02/18/2018   BUN 18 02/18/2018   CO2 21 (L) 02/18/2018   TSH 1.75 08/18/2017   PSA 1.79 12/17/2017   INR 1.11 01/08/2017   HGBA1C 5.9 12/17/2017   MICROALBUR 1.9 10/27/2017       Assessment & Plan:   Problem List Items Addressed This Visit    Atrial flutter ([place])    S/p cardioversion.  On eliquis.  Followed by cardiology.        CAD (coronary artery disease)    Continue risk factor modification.  Given syncope/near syncopal episode, will have him f/u with cardiology to confirm no further w/up warranted.  Medications adjusted. No longer on amlodipine.        Chronic back pain    Has seen Dr Sharlet Salina.  Increased right leg pain.  Seeing neurosurgery.  Planning for MRI.  Collagenous colitis    Bowels stable.        GERD (gastroesophageal reflux disease)    Controlled on current regimen.  Follow.       Hypertension    Off amlodipine.  Blood pressure as outlined.  Follow.        Hyponatremia    Has been worked up extensively.  Has seen nephrology.  Has been stable recently.  Continue to  follow.        Syncope    Was given fluids in ER.  Medication adjusted.  Off amlodipine now.  Blood pressure as outlined.  Has had similar episodes.  Will have cardiology evaluated to confirm no further cardiac w/up.  (i.e., monitor,etc).        Relevant Orders   Ambulatory referral to Cardiology       Einar Pheasant, MD

## 2018-02-26 DIAGNOSIS — M545 Low back pain: Secondary | ICD-10-CM | POA: Diagnosis not present

## 2018-03-02 ENCOUNTER — Other Ambulatory Visit: Payer: Self-pay | Admitting: Student

## 2018-03-02 DIAGNOSIS — M5416 Radiculopathy, lumbar region: Secondary | ICD-10-CM

## 2018-03-02 DIAGNOSIS — R29898 Other symptoms and signs involving the musculoskeletal system: Secondary | ICD-10-CM

## 2018-03-03 ENCOUNTER — Encounter: Payer: Self-pay | Admitting: Internal Medicine

## 2018-03-03 DIAGNOSIS — M79606 Pain in leg, unspecified: Secondary | ICD-10-CM

## 2018-03-03 DIAGNOSIS — G8929 Other chronic pain: Secondary | ICD-10-CM

## 2018-03-03 DIAGNOSIS — M549 Dorsalgia, unspecified: Principal | ICD-10-CM

## 2018-03-04 DIAGNOSIS — M545 Low back pain: Secondary | ICD-10-CM | POA: Diagnosis not present

## 2018-03-05 ENCOUNTER — Encounter: Payer: Self-pay | Admitting: Internal Medicine

## 2018-03-05 DIAGNOSIS — R55 Syncope and collapse: Secondary | ICD-10-CM | POA: Insufficient documentation

## 2018-03-05 NOTE — Assessment & Plan Note (Signed)
Bowels stable.  

## 2018-03-05 NOTE — Assessment & Plan Note (Signed)
Has been worked up extensively.  Has seen nephrology.  Has been stable recently.  Continue to follow.

## 2018-03-05 NOTE — Assessment & Plan Note (Signed)
S/p cardioversion.  On eliquis.  Followed by cardiology.   

## 2018-03-05 NOTE — Assessment & Plan Note (Signed)
Continue risk factor modification.  Given syncope/near syncopal episode, will have him f/u with cardiology to confirm no further w/up warranted.  Medications adjusted. No longer on amlodipine.

## 2018-03-05 NOTE — Assessment & Plan Note (Signed)
Controlled on current regimen.  Follow.  

## 2018-03-05 NOTE — Assessment & Plan Note (Signed)
Has seen Dr Sharlet Salina.  Increased right leg pain.  Seeing neurosurgery.  Planning for MRI.

## 2018-03-05 NOTE — Assessment & Plan Note (Signed)
Off amlodipine.  Blood pressure as outlined.  Follow.

## 2018-03-05 NOTE — Assessment & Plan Note (Signed)
Was given fluids in ER.  Medication adjusted.  Off amlodipine now.  Blood pressure as outlined.  Has had similar episodes.  Will have cardiology evaluated to confirm no further cardiac w/up.  (i.e., monitor,etc).

## 2018-03-06 ENCOUNTER — Other Ambulatory Visit: Payer: Self-pay

## 2018-03-06 MED ORDER — GABAPENTIN 100 MG PO CAPS: ORAL_CAPSULE | ORAL | 0 refills | Status: DC

## 2018-03-06 NOTE — Telephone Encounter (Signed)
Spoke with patient. Explained that we could increase his gabapentin to 400 mg in the morning and mid day then 800 mg qhs. Patient understood new directions. I have sent in new rx for gabapentin 100 mg caps with new directions. Advised that you would like to refer him to pain clinic or [facility] for increased pain. Patient would like to be referred to Dr. Sharlet Salina. Patient also stated that he is having a MRI done on Monday.

## 2018-03-06 NOTE — Telephone Encounter (Signed)
Please call pt.  He is currently taking 400mg  of gabapentin in am and 800mg  in pm.  We can increase to 400mg  in am and 400mg  midday and continue 800mg  in the evening.  Given increased pain, he needs f/u either with Dr Sharlet Salina or pain clinic - for pain control.  Let me know and I will place the order for the referral.

## 2018-03-08 NOTE — Telephone Encounter (Signed)
Order placed for referral to Dr Chasnis.   

## 2018-03-09 ENCOUNTER — Encounter (INDEPENDENT_AMBULATORY_CARE_PROVIDER_SITE_OTHER): Payer: Self-pay

## 2018-03-09 ENCOUNTER — Ambulatory Visit
Admission: RE | Admit: 2018-03-09 | Discharge: 2018-03-09 | Disposition: A | Discharge status: Home / Self Care | Payer: PPO | Source: Ambulatory Visit | Attending: Student | Admitting: Student

## 2018-03-09 DIAGNOSIS — M48061 Spinal stenosis, lumbar region without neurogenic claudication: Secondary | ICD-10-CM | POA: Diagnosis not present

## 2018-03-09 DIAGNOSIS — C946 Myelodysplastic disease, not classified: Secondary | ICD-10-CM | POA: Insufficient documentation

## 2018-03-09 DIAGNOSIS — D472 Monoclonal gammopathy: Secondary | ICD-10-CM | POA: Insufficient documentation

## 2018-03-09 DIAGNOSIS — R29898 Other symptoms and signs involving the musculoskeletal system: Secondary | ICD-10-CM

## 2018-03-09 DIAGNOSIS — Z981 Arthrodesis status: Secondary | ICD-10-CM | POA: Insufficient documentation

## 2018-03-09 DIAGNOSIS — M5416 Radiculopathy, lumbar region: Secondary | ICD-10-CM | POA: Diagnosis not present

## 2018-03-09 DIAGNOSIS — M47816 Spondylosis without myelopathy or radiculopathy, lumbar region: Secondary | ICD-10-CM | POA: Diagnosis not present

## 2018-03-10 ENCOUNTER — Ambulatory Visit: Payer: PPO

## 2018-03-13 DIAGNOSIS — Z9889 Other specified postprocedural states: Secondary | ICD-10-CM | POA: Diagnosis not present

## 2018-03-13 DIAGNOSIS — J410 Simple chronic bronchitis: Secondary | ICD-10-CM | POA: Diagnosis not present

## 2018-03-13 DIAGNOSIS — E7800 Pure hypercholesterolemia, unspecified: Secondary | ICD-10-CM | POA: Diagnosis not present

## 2018-03-13 DIAGNOSIS — R0789 Other chest pain: Secondary | ICD-10-CM | POA: Diagnosis not present

## 2018-03-13 DIAGNOSIS — Z720 Tobacco use: Secondary | ICD-10-CM | POA: Diagnosis not present

## 2018-03-13 DIAGNOSIS — G4733 Obstructive sleep apnea (adult) (pediatric): Secondary | ICD-10-CM | POA: Diagnosis not present

## 2018-03-13 DIAGNOSIS — I10 Essential (primary) hypertension: Secondary | ICD-10-CM | POA: Diagnosis not present

## 2018-03-17 ENCOUNTER — Ambulatory Visit: Payer: PPO | Admitting: Pulmonary Disease

## 2018-03-18 ENCOUNTER — Ambulatory Visit: Payer: PPO | Admitting: Internal Medicine

## 2018-03-23 ENCOUNTER — Other Ambulatory Visit: Payer: Self-pay | Admitting: Internal Medicine

## 2018-03-23 NOTE — Progress Notes (Signed)
[place]  [phone] [phone]  ID: Troy Lopez OB: 1940-08-27  MR#: [id number]  [facility]#:[id number]  Patient Care Team: Einar Pheasant, MD as PCP - General (Internal Medicine)  CHIEF COMPLAINT: [name]'s macroglobulinemia, iron deficiency anemia.  INTERVAL HISTORY: Patient returns to clinic today for repeat laboratory work, further evaluation, and consideration of additional IV Feraheme.  He currently feels well and is asymptomatic.  He does not complain of weakness or fatigue today. He has no neurologic complaints. He denies any recent fevers or illnesses.  He has a good appetite and denies weight loss. He has no chest pain or shortness of breath. He denies any nausea, vomiting, constipation, or diarrhea.  He denies any melena or hematochezia.  He has no urinary complaints.  Patient offers no specific complaints today.  REVIEW OF SYSTEMS:   Review of Systems  Constitutional: Negative.  Negative for fever, malaise/fatigue and weight loss.  Respiratory: Negative.  Negative for cough, hemoptysis and shortness of breath.   Cardiovascular: Negative.  Negative for chest pain and leg swelling.  Gastrointestinal: Negative.  Negative for abdominal pain, blood in stool, diarrhea and melena.  Genitourinary: Negative.  Negative for hematuria.  Musculoskeletal: Negative.  Negative for back pain.  Skin: Negative.  Negative for rash.  Neurological: Negative.  Negative for sensory change, focal weakness and weakness.  Psychiatric/Behavioral: Negative.  The patient is not nervous/anxious.     As per HPI. Otherwise, a complete review of systems is negative.  PAST MEDICAL HISTORY: Past Medical History:  Diagnosis Date  . Adrenal gland anomaly    enlargement  . Anxiety   . CAD (coronary artery disease)   . Carpal tunnel syndrome   . Chronic hyponatremia   . Colitis   . Colonic polyp   . COPD (chronic obstructive pulmonary disease) ([place])   .  Degenerative disc disease, lumbar   . Depression   . Diverticulosis   . Dyspnea   . GERD (gastroesophageal reflux disease)   . H/O degenerative disc disease   . Hypercholesterolemia   . Hyperkalemia   . Hyperlipidemia   . Hypertension   . Irritable bowel syndrome   . Monoclonal gammopathy   . Monoclonal gammopathy   . Neuropathy   . Personal history of tobacco use, presenting hazards to health 08/17/2015  . Sleep apnea   . Vertebral compression fracture ([place])     PAST SURGICAL HISTORY: Past Surgical History:  Procedure Laterality Date  . BUNIONECTOMY  1989  . CARPAL TUNNEL RELEASE Left 2011   ulnar nerve sub muscular at elbow  . CHOLECYSTECTOMY  09/07  . COLONOSCOPY WITH PROPOFOL N/A 03/05/2017   Procedure: COLONOSCOPY WITH PROPOFOL;  Surgeon: Lucilla Lame, MD;  Location[facility] ENDOSCOPY;  Service: Endoscopy;  Laterality: N/A;  . ELECTROPHYSIOLOGIC STUDY N/A 11/15/2015   Procedure: CARDIOVERSION;  Surgeon: Isaias Cowman, MD;  Location: [facility] ORS;  Service: Cardiovascular;  Laterality: N/A;  . ESOPHAGOGASTRODUODENOSCOPY (EGD) WITH PROPOFOL N/A 03/05/2017   Procedure: ESOPHAGOGASTRODUODENOSCOPY (EGD) WITH PROPOFOL;  Surgeon: Lucilla Lame, MD;  Location: [facility] ENDOSCOPY;  Service: Endoscopy;  Laterality: N/A;  . ESOPHAGOGASTRODUODENOSCOPY (EGD) WITH PROPOFOL N/A 10/17/2017   Procedure: ESOPHAGOGASTRODUODENOSCOPY (EGD) WITH PROPOFOL;  Surgeon: Lollie Sails, MD;  Location: [facility] ENDOSCOPY;  Service: Endoscopy;  Laterality: N/A;  . EYE SURGERY Bilateral 2010   cataract  . HEMORRHOID SURGERY    . KYPHOPLASTY N/A 11/04/2016   Procedure: KYPHOPLASTY T 12;  Surgeon: Hessie Knows, MD;  Location: [facility] ORS;  Service: Orthopedics;  Laterality:  N/A;    FAMILY HISTORY Family History  Problem Relation Age of Onset  . Stroke Mother   . Heart disease Father        MI - 42   . Colon cancer Neg Hx   . Prostate cancer Neg Hx        ADVANCED DIRECTIVES:    HEALTH  MAINTENANCE: Social History   Tobacco Use  . Smoking status: Former Smoker    Types: Cigarettes    Last attempt to quit: 11/01/2015    Years since quitting: 2.4  . Smokeless tobacco: Never Used  . Tobacco comment: about 2 cigarettes per day, trying to quit  Substance Use Topics  . Alcohol use: Yes    Alcohol/week: 42.0 standard drinks    Types: 42 Cans of beer per week    Comment: occas  . Drug use: No     Colonoscopy:  PAP:  Bone density:  Lipid panel:  Allergies  Allergen Reactions  . Iodinated Diagnostic Agents Other (See Comments)    Contraindication secondary to IGMgammopathy/Waldren's syndrome   Not to be given due to Waldenstrom's syndrome, told it could affect kidney function    Current Outpatient Medications  Medication Sig Dispense Refill  . acetaminophen (TYLENOL) 500 MG tablet Take 500-1,000 mg by mouth 3 (three) times daily as needed for moderate pain or headache.    . alendronate (FOSAMAX) 70 MG tablet Take 70 mg by mouth once a week. Take with a full glass of water on an empty stomach. Takes on Sundays    . budesonide (ENTOCORT EC) 3 MG 24 hr capsule Take 9 mg by mouth daily as needed (colitis flare).     Marland Kitchen docusate sodium (COLACE) 100 MG capsule     . ELIQUIS 5 MG TABS tablet Take 5 mg by mouth 2 (two) times daily.     Marland Kitchen FIBER PO Take 2 capsules by mouth daily.    . fluticasone (FLONASE) 50 MCG/ACT nasal spray Place 2 sprays into both nostrils daily. 16 g 0  . fluticasone furoate-vilanterol (BREO ELLIPTA) 100-25 MCG/INH AEPB Inhale 1 puff into the lungs daily. 180 each 3  . furosemide (LASIX) 20 MG tablet Take 1 tablet (20 mg total) by mouth daily as needed. 30 tablet 0  . gabapentin (NEURONTIN) 100 MG capsule Take 4 capsules (400 mg) daily in the morning and midday. Take 8 capsules (800 mg) daily in the evening. 480 capsule 0  . isosorbide mononitrate (IMDUR) 30 MG 24 hr tablet Take 1 tablet (30 mg total) by mouth once daily.    Marland Kitchen losartan (COZAAR) 100 MG  tablet Take 1 tablet (100 mg total) by mouth daily. 90 tablet 0  . metoprolol tartrate (LOPRESSOR) 100 MG tablet Take 1 tablet (100 mg total) by mouth 2 (two) times daily. 60 tablet 0  . pantoprazole (PROTONIX) 40 MG tablet Take 1 tablet (40 mg total) by mouth 2 (two) times daily. 180 tablet 0  . promethazine (PHENERGAN) 12.5 MG tablet Take 12.5 mg by mouth every 6 (six) hours as needed for nausea or vomiting.    . sertraline (ZOLOFT) 50 MG tablet Take 1 tablet (50 mg total) by mouth daily. 30 tablet 0  . vitamin B-12 (CYANOCOBALAMIN) 1000 MCG tablet Take 1,000 mcg by mouth daily.     Marland Kitchen albuterol (PROVENTIL HFA;VENTOLIN HFA) 108 (90 Base) MCG/ACT inhaler Inhale 2 puffs into the lungs every 6 (six) hours as needed for wheezing or shortness of breath.    Marland Kitchen amLODipine (  NORVASC) 10 MG tablet Take 1 tablet (5 mg total) by mouth daily. (Patient not taking: Reported on 02/25/2018) 90 tablet 0  . isosorbide mononitrate (IMDUR) 30 MG 24 hr tablet Take 30 mg by mouth daily.     . mupirocin ointment (BACTROBAN) 2 % Apply 1 application topically 2 (two) times daily. (Patient not taking: Reported on 03/27/2018) 22 g 0  . Tiotropium Bromide Monohydrate (SPIRIVA RESPIMAT) 1.25 MCG/ACT AERS Inhale 2.5 mcg daily into the lungs. (Patient not taking: Reported on 03/27/2018) 4 g 10   No current facility-administered medications for this visit.     OBJECTIVE: Vitals:   03/27/18 1039  BP: (!) 156/83  Pulse: (!) 57  Resp: 18  Temp: 98.2 F (36.8 C)     Body mass index is 27.17 kg/m.    ECOG FS:0 - Asymptomatic  General: Well-developed, well-nourished, no acute distress. Eyes: Pink conjunctiva, anicteric sclera. HEENT: Normocephalic, moist mucous membranes. Lungs: Clear to auscultation bilaterally. Heart: Regular rate and rhythm. No rubs, murmurs, or gallops. Abdomen: Soft, nontender, nondistended. No organomegaly noted, normoactive bowel sounds. Musculoskeletal: No edema, cyanosis, or clubbing. Neuro: Alert,  answering all questions appropriately. Cranial nerves grossly intact. Skin: No rashes or petechiae noted. Psych: Normal affect.   LAB RESULTS:  Lab Results  Component Value Date   NA 129 (L) 02/18/2018   K 4.5 02/18/2018   CL 100 02/18/2018   CO2 21 (L) 02/18/2018   GLUCOSE 102 (H) 02/18/2018   BUN 18 02/18/2018   CREATININE 1.18 02/18/2018   CALCIUM 9.2 02/18/2018   PROT 7.9 02/18/2018   ALBUMIN 3.3 (L) 02/18/2018   AST 21 02/18/2018   ALT 11 02/18/2018   ALKPHOS 48 02/18/2018   BILITOT 0.7 02/18/2018   GFRNONAA 59 (L) 02/18/2018   GFRAA >60 02/18/2018    Lab Results  Component Value Date   WBC 5.5 03/25/2018   NEUTROABS 3.3 03/25/2018   HGB 11.4 (L) 03/25/2018   HCT 33.2 (L) 03/25/2018   MCV 104.8 (H) 03/25/2018   PLT 386 03/25/2018   Lab Results  Component Value Date   IRON 82 03/25/2018   TIBC 1,087 (H) 03/25/2018   IRONPCTSAT 8 (L) 03/25/2018    Lab Results  Component Value Date   FERRITIN 19 (L) 03/25/2018   Lab Results  Component Value Date   TOTALPROTELP 8.1 12/23/2017   ALBUMINELP 3.4 12/23/2017   A1GS 0.2 12/23/2017   A2GS 0.6 12/23/2017   BETS 1.4 (H) 12/23/2017   BETA2SER 0.2 02/14/2016   GAMS 2.4 (H) 12/23/2017   MSPIKE 1.8 (H) 12/23/2017   SPEI Comment 12/23/2017    STUDIES: Mr Lumbar Spine Wo Contrast  Result Date: 03/09/2018 CLINICAL DATA:  Low back and RIGHT hip pain with numbness for greater than 6 weeks. History of monoclonal gammopathy. EXAM: MRI LUMBAR SPINE WITHOUT CONTRAST TECHNIQUE: Multiplanar, multisequence MR imaging of the lumbar spine was performed. No intravenous contrast was administered. COMPARISON:  MRI lumbar spine October 23, 2016 FINDINGS: SEGMENTATION: For the purposes of this report, the last well-formed intervertebral disc is reported as L5-S1. ALIGNMENT: Maintained lumbar lordosis. Grade 1 L4-5 anterolisthesis without spondylolysis. Subacute bone marrow changes L4-5 facets. Grade 1 L1-2 and L2-3 retrolisthesis.  VERTEBRAE:Low signal within T12 moderate old burst fracture consistent with interval cement augmentation. Diffusely low bone marrow signal again noted. Moderate acute on chronic discogenic endplate changes S2-8, J6-8. Partially imaged suspected T11 compression fracture. CONUS MEDULLARIS AND CAUDA EQUINA: Conus medullaris terminates at T12-L1 and demonstrates normal morphology and signal  characteristics. Cauda equina is normal. PARASPINAL AND OTHER SOFT TISSUES: Nonacute. Mild symmetric paraspinal muscle atrophy. DISC LEVELS: T11-12 (no axial images): Retropulsed bony fragments resulting in mild canal stenosis. No neural foraminal narrowing. T12-L1: Small broad-based disc bulge. No canal stenosis or neural foraminal narrowing. L1-2: Retrolisthesis. Moderate disc osteophyte complex. Mild facet arthropathy and ligamentum flavum redundancy. No canal stenosis. Moderate RIGHT, mild-to-moderate LEFT neural foraminal narrowing. L2-3: Retrolisthesis. Small broad-based disc osteophyte complex. Mild facet arthropathy and ligamentum flavum redundancy. Mild canal stenosis. Mild-to-moderate bilateral neural foraminal narrowing. L3-4: Small broad-based disc bulge. Mild-to-moderate facet arthropathy and ligamentum flavum redundancy. Mild canal stenosis. Mild-to-moderate RIGHT, moderate to severe LEFT neural foraminal narrowing is similar to worse. L4-5: Anterolisthesis. Unroofing of the disc with endplate spurring. Severe facet arthropathy. Moderate canal stenosis has increased. Mild-to-moderate RIGHT, moderate LEFT neural foraminal narrowing. L5-S1: Moderate broad-based disc osteophyte complex asymmetric to the LEFT. No canal stenosis though there is encroachment upon the traversing S1 nerves within the lateral recess. Mild LEFT neural foraminal narrowing. IMPRESSION: 1. T11 suspected fracture, incompletely imaged. Old T12 moderate fracture, status post cement augmentation. 2. Generally decreased T1 bone marrow signal as  previously noted seen with myeloproliferative disease and attributable to patient's history of monoclonal gammopathy. 3. 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 Grade 1 L1-2 and L2-3 retrolisthesis. 4. Progressed degenerative change of the lumbar spine. Moderate canal stenosis L4-5. Mild canal stenosis T11-12, L2-3 and L3-4. 5. Neural foraminal narrowing at all lumbar levels: Moderate to severe on the LEFT at L3-4. Electronically Signed   By: Elon Alas M.D.   On: 03/09/2018 13:58    ASSESSMENT: [name]'s macroglobulinemia, iron deficiency anemia.  PLAN:    1. [name]'s macroglobulinemia:  Bone marrow biopsy results reviewed independently confirming underlying [name]'s.  Patient's most recent M spike from Dec 23, 2017 was reported 1.8 which is slightly decreased from previous.  His IgM remains significantly elevated as well, but has trended down slightly. Patient has iron deficiency anemia from a GI source, but no other evidence of endorgan damage. He does not require any treatment at this time. If he had progression of disease as evidenced by B-symptoms or endorgan damage, will consider Rituxan-based therapy.  Repeat laboratory work in 3 months at next clinic visit.   2. 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  Patient's hemoglobin and iron stores are slowly trending down, but he is symptomatic and does not wish to pursue IV iron at this time.  He expressed understanding that he will likely need an infusion at his next clinic visit.  Return to clinic in 3 months with repeat laboratory work and further evaluation.  3.  Back pain: Patient has appointment in pain clinic on April 09, 2018.  Patient expressed understanding and was in agreement with this plan. He also understands that He can call clinic at any time with any  questions, concerns, or complaints.    Lloyd Huger, MD   03/27/2018 12:55 PM

## 2018-03-25 ENCOUNTER — Other Ambulatory Visit: Payer: PPO

## 2018-03-25 ENCOUNTER — Inpatient Hospital Stay: Payer: PPO | Attending: Oncology

## 2018-03-25 DIAGNOSIS — F418 Other specified anxiety disorders: Secondary | ICD-10-CM | POA: Diagnosis not present

## 2018-03-25 DIAGNOSIS — I10 Essential (primary) hypertension: Secondary | ICD-10-CM | POA: Insufficient documentation

## 2018-03-25 DIAGNOSIS — G4730 Sleep apnea, unspecified: Secondary | ICD-10-CM | POA: Insufficient documentation

## 2018-03-25 DIAGNOSIS — E871 Hypo-osmolality and hyponatremia: Secondary | ICD-10-CM | POA: Diagnosis not present

## 2018-03-25 DIAGNOSIS — K219 Gastro-esophageal reflux disease without esophagitis: Secondary | ICD-10-CM | POA: Diagnosis not present

## 2018-03-25 DIAGNOSIS — C880 Waldenstrom macroglobulinemia: Secondary | ICD-10-CM | POA: Insufficient documentation

## 2018-03-25 DIAGNOSIS — Z79899 Other long term (current) drug therapy: Secondary | ICD-10-CM | POA: Diagnosis not present

## 2018-03-25 DIAGNOSIS — M549 Dorsalgia, unspecified: Secondary | ICD-10-CM | POA: Diagnosis not present

## 2018-03-25 DIAGNOSIS — M48061 Spinal stenosis, lumbar region without neurogenic claudication: Secondary | ICD-10-CM | POA: Insufficient documentation

## 2018-03-25 DIAGNOSIS — J449 Chronic obstructive pulmonary disease, unspecified: Secondary | ICD-10-CM | POA: Insufficient documentation

## 2018-03-25 DIAGNOSIS — F329 Major depressive disorder, single episode, unspecified: Secondary | ICD-10-CM | POA: Insufficient documentation

## 2018-03-25 DIAGNOSIS — G629 Polyneuropathy, unspecified: Secondary | ICD-10-CM | POA: Insufficient documentation

## 2018-03-25 DIAGNOSIS — Z87891 Personal history of nicotine dependence: Secondary | ICD-10-CM | POA: Diagnosis not present

## 2018-03-25 DIAGNOSIS — Z86010 Personal history of colonic polyps: Secondary | ICD-10-CM | POA: Insufficient documentation

## 2018-03-25 DIAGNOSIS — D649 Anemia, unspecified: Secondary | ICD-10-CM

## 2018-03-25 DIAGNOSIS — I2510 Atherosclerotic heart disease of native coronary artery without angina pectoris: Secondary | ICD-10-CM | POA: Insufficient documentation

## 2018-03-25 DIAGNOSIS — D509 Iron deficiency anemia, unspecified: Secondary | ICD-10-CM | POA: Insufficient documentation

## 2018-03-25 DIAGNOSIS — M5136 Other intervertebral disc degeneration, lumbar region: Secondary | ICD-10-CM | POA: Diagnosis not present

## 2018-03-25 DIAGNOSIS — E7800 Pure hypercholesterolemia, unspecified: Secondary | ICD-10-CM | POA: Diagnosis not present

## 2018-03-25 LAB — IRON AND TIBC
Iron: 82 ug/dL (ref 45–182)
Saturation Ratios: 8 % — ABNORMAL LOW (ref 17.9–39.5)
TIBC: 1087 ug/dL — ABNORMAL HIGH (ref 250–450)
UIBC: 1005 ug/dL

## 2018-03-25 LAB — CBC WITH DIFFERENTIAL/PLATELET
BASOS ABS: 0 10*3/uL (ref 0–0.1)
BASOS PCT: 1 %
EOS PCT: 1 %
Eosinophils Absolute: 0 10*3/uL (ref 0–0.7)
HCT: 33.2 % — ABNORMAL LOW (ref 40.0–52.0)
Hemoglobin: 11.4 g/dL — ABNORMAL LOW (ref 13.0–18.0)
Lymphocytes Relative: 24 %
Lymphs Abs: 1.3 10*3/uL (ref 1.0–3.6)
MCH: 35.9 pg — ABNORMAL HIGH (ref 26.0–34.0)
MCHC: 34.3 g/dL (ref 32.0–36.0)
MCV: 104.8 fL — ABNORMAL HIGH (ref 80.0–100.0)
MONO ABS: 0.7 10*3/uL (ref 0.2–1.0)
Monocytes Relative: 13 %
NEUTROS ABS: 3.3 10*3/uL (ref 1.4–6.5)
Neutrophils Relative %: 61 %
PLATELETS: 386 10*3/uL (ref 150–440)
RBC: 3.17 MIL/uL — AB (ref 4.40–5.90)
RDW: 14.5 % (ref 11.5–14.5)
WBC: 5.5 10*3/uL (ref 3.8–10.6)

## 2018-03-25 LAB — FERRITIN: FERRITIN: 19 ng/mL — AB (ref 24–336)

## 2018-03-27 ENCOUNTER — Inpatient Hospital Stay: Payer: PPO

## 2018-03-27 ENCOUNTER — Inpatient Hospital Stay: Payer: PPO | Admitting: Oncology

## 2018-03-27 ENCOUNTER — Other Ambulatory Visit: Payer: Self-pay

## 2018-03-27 ENCOUNTER — Encounter: Payer: Self-pay | Admitting: Oncology

## 2018-03-27 VITALS — BP 156/83 | HR 57 | Temp 98.2°F | Resp 18 | Wt 189.4 lb

## 2018-03-27 DIAGNOSIS — M5136 Other intervertebral disc degeneration, lumbar region: Secondary | ICD-10-CM | POA: Diagnosis not present

## 2018-03-27 DIAGNOSIS — F329 Major depressive disorder, single episode, unspecified: Secondary | ICD-10-CM | POA: Diagnosis not present

## 2018-03-27 DIAGNOSIS — E871 Hypo-osmolality and hyponatremia: Secondary | ICD-10-CM

## 2018-03-27 DIAGNOSIS — I2510 Atherosclerotic heart disease of native coronary artery without angina pectoris: Secondary | ICD-10-CM | POA: Diagnosis not present

## 2018-03-27 DIAGNOSIS — M549 Dorsalgia, unspecified: Secondary | ICD-10-CM

## 2018-03-27 DIAGNOSIS — Z79899 Other long term (current) drug therapy: Secondary | ICD-10-CM

## 2018-03-27 DIAGNOSIS — G4730 Sleep apnea, unspecified: Secondary | ICD-10-CM

## 2018-03-27 DIAGNOSIS — F418 Other specified anxiety disorders: Secondary | ICD-10-CM

## 2018-03-27 DIAGNOSIS — E7800 Pure hypercholesterolemia, unspecified: Secondary | ICD-10-CM

## 2018-03-27 DIAGNOSIS — I10 Essential (primary) hypertension: Secondary | ICD-10-CM | POA: Diagnosis not present

## 2018-03-27 DIAGNOSIS — M48061 Spinal stenosis, lumbar region without neurogenic claudication: Secondary | ICD-10-CM

## 2018-03-27 DIAGNOSIS — C880 Waldenstrom macroglobulinemia: Secondary | ICD-10-CM

## 2018-03-27 DIAGNOSIS — K219 Gastro-esophageal reflux disease without esophagitis: Secondary | ICD-10-CM

## 2018-03-27 DIAGNOSIS — J449 Chronic obstructive pulmonary disease, unspecified: Secondary | ICD-10-CM

## 2018-03-27 DIAGNOSIS — Z86010 Personal history of colonic polyps: Secondary | ICD-10-CM

## 2018-03-27 DIAGNOSIS — G629 Polyneuropathy, unspecified: Secondary | ICD-10-CM

## 2018-03-27 DIAGNOSIS — D509 Iron deficiency anemia, unspecified: Secondary | ICD-10-CM

## 2018-03-27 DIAGNOSIS — Z87891 Personal history of nicotine dependence: Secondary | ICD-10-CM

## 2018-03-27 NOTE — Progress Notes (Signed)
Patient here for follow up. No concerns voiced.  °

## 2018-04-03 ENCOUNTER — Telehealth: Payer: Self-pay | Admitting: Pulmonary Disease

## 2018-04-03 NOTE — Telephone Encounter (Signed)
Please call pt to schedule PFT and chest xray

## 2018-04-06 NOTE — Telephone Encounter (Signed)
PFT appointment scheduled for 04/16/18 at 10:30 at [facility]. F/U appointment scheduled with Dr. Alva Garnet for [place] 04/28/18 at 10:30. Pt is aware of both appointments. Appointments mailed to patient along with PFT Instruction Sheet. Rhonda J Cobb

## 2018-04-06 NOTE — Telephone Encounter (Signed)
LMOAM for pt to return call to schedule PFT/CXR and ROV in Sept with Dr. Alva Garnet. Waiting on pt to return my call. Rhonda J Cobb

## 2018-04-06 NOTE — Telephone Encounter (Signed)
Pt wants to schedule PFT

## 2018-04-09 ENCOUNTER — Other Ambulatory Visit: Payer: Self-pay

## 2018-04-09 ENCOUNTER — Ambulatory Visit
Payer: PPO | Attending: Student in an Organized Health Care Education/Training Program | Admitting: Student in an Organized Health Care Education/Training Program

## 2018-04-09 ENCOUNTER — Encounter: Payer: Self-pay | Admitting: Student in an Organized Health Care Education/Training Program

## 2018-04-09 VITALS — BP 184/94 | HR 59 | Temp 98.4°F | Resp 16 | Ht 71.0 in | Wt 186.0 lb

## 2018-04-09 DIAGNOSIS — M5116 Intervertebral disc disorders with radiculopathy, lumbar region: Secondary | ICD-10-CM | POA: Diagnosis not present

## 2018-04-09 DIAGNOSIS — I712 Thoracic aortic aneurysm, without rupture: Secondary | ICD-10-CM | POA: Insufficient documentation

## 2018-04-09 DIAGNOSIS — I2510 Atherosclerotic heart disease of native coronary artery without angina pectoris: Secondary | ICD-10-CM | POA: Insufficient documentation

## 2018-04-09 DIAGNOSIS — Z7951 Long term (current) use of inhaled steroids: Secondary | ICD-10-CM | POA: Diagnosis not present

## 2018-04-09 DIAGNOSIS — M5416 Radiculopathy, lumbar region: Secondary | ICD-10-CM | POA: Diagnosis not present

## 2018-04-09 DIAGNOSIS — M48061 Spinal stenosis, lumbar region without neurogenic claudication: Secondary | ICD-10-CM | POA: Diagnosis not present

## 2018-04-09 DIAGNOSIS — Z9049 Acquired absence of other specified parts of digestive tract: Secondary | ICD-10-CM | POA: Insufficient documentation

## 2018-04-09 DIAGNOSIS — K589 Irritable bowel syndrome without diarrhea: Secondary | ICD-10-CM | POA: Insufficient documentation

## 2018-04-09 DIAGNOSIS — Z87891 Personal history of nicotine dependence: Secondary | ICD-10-CM | POA: Diagnosis not present

## 2018-04-09 DIAGNOSIS — E785 Hyperlipidemia, unspecified: Secondary | ICD-10-CM | POA: Diagnosis not present

## 2018-04-09 DIAGNOSIS — K219 Gastro-esophageal reflux disease without esophagitis: Secondary | ICD-10-CM | POA: Diagnosis not present

## 2018-04-09 DIAGNOSIS — R0781 Pleurodynia: Secondary | ICD-10-CM | POA: Insufficient documentation

## 2018-04-09 DIAGNOSIS — I272 Pulmonary hypertension, unspecified: Secondary | ICD-10-CM | POA: Insufficient documentation

## 2018-04-09 DIAGNOSIS — M79604 Pain in right leg: Secondary | ICD-10-CM | POA: Diagnosis not present

## 2018-04-09 DIAGNOSIS — I4892 Unspecified atrial flutter: Secondary | ICD-10-CM | POA: Diagnosis not present

## 2018-04-09 DIAGNOSIS — Z7901 Long term (current) use of anticoagulants: Secondary | ICD-10-CM | POA: Diagnosis not present

## 2018-04-09 DIAGNOSIS — D472 Monoclonal gammopathy: Secondary | ICD-10-CM | POA: Diagnosis not present

## 2018-04-09 DIAGNOSIS — Z86010 Personal history of colonic polyps: Secondary | ICD-10-CM | POA: Diagnosis not present

## 2018-04-09 DIAGNOSIS — I10 Essential (primary) hypertension: Secondary | ICD-10-CM | POA: Diagnosis not present

## 2018-04-09 DIAGNOSIS — G4730 Sleep apnea, unspecified: Secondary | ICD-10-CM | POA: Insufficient documentation

## 2018-04-09 DIAGNOSIS — J449 Chronic obstructive pulmonary disease, unspecified: Secondary | ICD-10-CM | POA: Insufficient documentation

## 2018-04-09 DIAGNOSIS — F329 Major depressive disorder, single episode, unspecified: Secondary | ICD-10-CM | POA: Insufficient documentation

## 2018-04-09 DIAGNOSIS — Z7983 Long term (current) use of bisphosphonates: Secondary | ICD-10-CM | POA: Diagnosis not present

## 2018-04-09 DIAGNOSIS — M5136 Other intervertebral disc degeneration, lumbar region: Secondary | ICD-10-CM

## 2018-04-09 DIAGNOSIS — F1010 Alcohol abuse, uncomplicated: Secondary | ICD-10-CM | POA: Diagnosis not present

## 2018-04-09 DIAGNOSIS — C880 Waldenstrom macroglobulinemia: Secondary | ICD-10-CM | POA: Insufficient documentation

## 2018-04-09 DIAGNOSIS — Z8781 Personal history of (healed) traumatic fracture: Secondary | ICD-10-CM | POA: Insufficient documentation

## 2018-04-09 DIAGNOSIS — M9983 Other biomechanical lesions of lumbar region: Secondary | ICD-10-CM | POA: Diagnosis not present

## 2018-04-09 DIAGNOSIS — Z79899 Other long term (current) drug therapy: Secondary | ICD-10-CM | POA: Diagnosis not present

## 2018-04-09 DIAGNOSIS — R55 Syncope and collapse: Secondary | ICD-10-CM | POA: Insufficient documentation

## 2018-04-09 DIAGNOSIS — Z91041 Radiographic dye allergy status: Secondary | ICD-10-CM | POA: Diagnosis not present

## 2018-04-09 DIAGNOSIS — G894 Chronic pain syndrome: Secondary | ICD-10-CM | POA: Diagnosis not present

## 2018-04-09 DIAGNOSIS — M79651 Pain in right thigh: Secondary | ICD-10-CM | POA: Diagnosis present

## 2018-04-09 DIAGNOSIS — K5900 Constipation, unspecified: Secondary | ICD-10-CM | POA: Insufficient documentation

## 2018-04-09 NOTE — Progress Notes (Signed)
Safety precautions to be maintained throughout the outpatient stay will include: orient to surroundings, keep bed in low position, maintain call bell within reach at all times, provide assistance with transfer out of bed and ambulation.  

## 2018-04-09 NOTE — Progress Notes (Deleted)
Patient's Name: PANTELIS ELGERSMA  MRN: [id number]  Referring Provider: Marin Olp, PA-C  DOB: 01/02/1941  PCP: Einar Pheasant, MD  DOS: 04/09/2018  Note by: Gillis Santa, MD  Service setting: Ambulatory outpatient  Specialty: Interventional Pain Management  Location: [facility] (AMB) Pain Management Facility  Visit type: Initial Patient Evaluation  Patient type: New Patient   Primary Reason(s) for Visit: Encounter for initial evaluation of one or more chronic problems (new to examiner) potentially causing chronic pain, and posing a threat to normal musculoskeletal function. (Level of risk: High) CC: Leg Pain (right thigh)  HPI  Mr. Proudfoot is a 77 y.o. year old, male patient, who comes today to see [place] for the first time for an initial evaluation of his chronic pain. He has Iron deficiency anemia; MGUS (monoclonal gammopathy of unknown significance); CAD (coronary artery disease); Hypertension; Hyponatremia; GERD (gastroesophageal reflux disease); Sleep apnea; Ulcer; Chronic back pain; SOB (shortness of breath); Leg pain; Collagenous colitis; Health care maintenance; Personal history of tobacco use, presenting hazards to health; Sleep difficulties; Skin lesion; Depression; Moderate COPD (chronic obstructive pulmonary disease) ([place]); COPD exacerbation ([place]); History of compression fracture of spine; Alcohol abuse; Waldenstrom's macroglobulinemia ([place]); Chest pain; Blood in stool; Abnormal feces; Rectal polyp; Thoracic aortic aneurysm ([place]); Pulmonary hypertension ([place]); Atrial flutter ([place]); Hyperlipemia; Constipation; Fall; Rib pain on right side; and Syncope on their problem list. Today he comes in for evaluation of his Leg Pain (right thigh)  Pain Assessment: Location: Right Leg Radiating: sometimes move to hip and down to knee; knee pain manifests as throbbing spasms Onset: More than a month ago Duration: Chronic pain Quality: Aching, Sore, Constant Severity: 3 /10 (subjective, self-reported pain  score)  Note: Reported level is compatible with observation.                         When using our objective Pain Scale, levels between 6 and 10/10 are said to belong in an emergency room, as it progressively worsens from a 6/10, described as severely limiting, requiring emergency care not usually available at an outpatient pain management facility. At a 6/10 level, communication becomes difficult and requires great effort. Assistance to reach the emergency department may be required. Facial flushing and profuse sweating along with potentially dangerous increases in heart rate and blood pressure will be evident. Effect on ADL:   Timing: Constant Modifying factors: TENS unit, heating pad BP: (!) 184/94  HR: (!) 59  Onset and Duration: {Hx; Onset and Duration:210120511} Cause of pain: {Hx; Cause:210120521} Severity: {Pain Severity:210120502} Timing: {Symptoms; Timing:210120501} Aggravating Factors: {Causes; Aggravating pain factors:210120507} Alleviating Factors: {Causes; Alleviating Factors:210120500} Associated Problems: {Hx; Associated problems:210120515} Quality of Pain: {Hx; Symptom quality or Descriptor:210120531} Previous Examinations or Tests: {Hx; Previous examinations or test:210120529} Previous Treatments: {Hx; Previous Treatment:210120503}  The patient comes into the clinics today for the first time for a chronic pain management evaluation. ***  Today I took the time to provide the patient with information regarding my pain practice. The patient was informed that my practice is divided into two sections: an interventional pain management section, as well as a completely separate and distinct medication management section. I explained that I have procedure days for my interventional therapies, and evaluation days for follow-ups and medication management. Because of the amount of documentation required during both, they are kept separated. This means that there is the possibility that  he may be scheduled for a procedure on one day, and medication management the next. I  have also informed him that because of staffing and facility limitations, I no longer take patients for medication management only. To illustrate the reasons for this, I gave the patient the example of surgeons, and how inappropriate it would be to refer a patient to his/her care, just to write for the post-surgical antibiotics on a surgery done by a different surgeon.   Because interventional pain management is my board-certified specialty, the patient was informed that joining my practice means that they are open to any and all interventional therapies. I made it clear that this does not mean that they will be forced to have any procedures done. What this means is that I believe interventional therapies to be essential part of the diagnosis and proper management of chronic pain conditions. Therefore, patients not interested in these interventional alternatives will be better served under the care of a different practitioner.  The patient was also made aware of my Comprehensive Pain Management Safety Guidelines where by joining my practice, they limit all of their nerve blocks and joint injections to those done by our practice, for as long as we are retained to manage their care.   Historic Controlled Substance Pharmacotherapy Review  PMP and historical list of controlled substances: ***  Highest opioid analgesic regimen found: ***  Most recent opioid analgesic: ***  Current opioid analgesics: ***  Highest recorded MME/day: *** mg/day MME/day: *** mg/day Medications: The patient did not bring the medication(s) to the appointment, as requested in our "New Patient Package" Pharmacodynamics: Desired effects: Analgesia: The patient reports >50% benefit. Reported improvement in function: The patient reports medication allows him to accomplish basic ADLs. Clinically meaningful improvement in function (CMIF): Sustained  CMIF goals met Perceived effectiveness: Described as relatively effective, allowing for increase in activities of daily living (ADL) Undesirable effects: Side-effects or Adverse reactions: None reported Historical Monitoring: The patient  reports that he does not use drugs. List of all UDS Test(s): No results found for: MDMA, COCAINSCRNUR, [place], [place], CANNABQUANT, [place], [place] List of other Serum/Urine Drug Screening Test(s):  No results found for: AMPHSCRSER, BARBSCRSER, BENZOSCRSER, COCAINSCRSER, COCAINSCRNUR, PCPSCRSER, PCPQUANT, THCSCRSER, THCU, CANNABQUANT, OPIATESCRSER, OXYSCRSER, PROPOXSCRSER, ETH Historical Background Evaluation: [place] PMP: Six (6) year initial data search conducted.             PMP NARX Score Report:  Narcotic: *** Sedative: *** Stimulant: *** [place] Department of public safety, offender search: Editor, commissioning Information) Non-contributory Risk Assessment Profile: Aberrant behavior: None observed or detected today Risk factors for fatal opioid overdose: None identified today PMP NARX Overdose Risk Score: *** Fatal overdose hazard ratio (HR): Calculation deferred Non-fatal overdose hazard ratio (HR): Calculation deferred Risk of opioid abuse or dependence: 0.7-3.0% with doses ? 36 MME/day and 6.1-26% with doses ? 120 MME/day. Substance use disorder (SUD) risk level: See below Personal History of Substance Abuse (SUD-Substance use disorder):  Alcohol: Negative  Illegal Drugs: Negative  Rx Drugs: Negative  ORT Risk Level calculation: Moderate Risk Opioid Risk Tool - 04/09/18 1028      Family History of Substance Abuse   Alcohol  Positive Male    Illegal Drugs  Negative    Rx Drugs  Negative      Personal History of Substance Abuse   Alcohol  Negative    Illegal Drugs  Negative    Rx Drugs  Negative      Age   Age between 50-45 years   No      History of Preadolescent Sexual Abuse   History  of Preadolescent Sexual Abuse  Negative or Male       Psychological Disease   Psychological Disease  Negative    Depression  Positive      Total Score   Opioid Risk Tool Scoring  4    Opioid Risk Interpretation  Moderate Risk      ORT Scoring interpretation table:  Score <3 = Low Risk for SUD  Score between 4-7 = Moderate Risk for SUD  Score >8 = High Risk for Opioid Abuse   PHQ-2 Depression Scale:  Total score: 0  PHQ-2 Scoring interpretation table: (Score and probability of major depressive disorder)  Score 0 = No depression  Score 1 = 15.4% Probability  Score 2 = 21.1% Probability  Score 3 = 38.4% Probability  Score 4 = 45.5% Probability  Score 5 = 56.4% Probability  Score 6 = 78.6% Probability   PHQ-9 Depression Scale:  Total score: 0  PHQ-9 Scoring interpretation table:  Score 0-4 = No depression  Score 5-9 = Mild depression  Score 10-14 = Moderate depression  Score 15-19 = Moderately severe depression  Score 20-27 = Severe depression (2.4 times higher risk of SUD and 2.89 times higher risk of overuse)   Pharmacologic Plan: As per protocol, I have not taken over any controlled substance management, pending the results of ordered tests and/or consults.            Initial impression: Pending review of available data and ordered tests.  Meds   Current Outpatient Medications:  .  acetaminophen (TYLENOL) 500 MG tablet, Take 500-1,000 mg by mouth 3 (three) times daily as needed for moderate pain or headache., Disp: , Rfl:  .  albuterol (PROVENTIL HFA;VENTOLIN HFA) 108 (90 Base) MCG/ACT inhaler, Inhale 2 puffs into the lungs every 6 (six) hours as needed for wheezing or shortness of breath., Disp: , Rfl:  .  alendronate (FOSAMAX) 70 MG tablet, Take 70 mg by mouth once a week. Take with a full glass of water on an empty stomach. Takes on Sundays, Disp: , Rfl:  .  budesonide (ENTOCORT EC) 3 MG 24 hr capsule, Take 9 mg by mouth daily as needed (colitis flare). , Disp: , Rfl:  .  docusate sodium (COLACE) 100 MG capsule, , Disp: ,  Rfl:  .  ELIQUIS 5 MG TABS tablet, Take 5 mg by mouth 2 (two) times daily. , Disp: , Rfl:  .  FIBER PO, Take 2 capsules by mouth daily., Disp: , Rfl:  .  fluticasone (FLONASE) 50 MCG/ACT nasal spray, Place 2 sprays into both nostrils daily., Disp: 16 g, Rfl: 0 .  fluticasone furoate-vilanterol (BREO ELLIPTA) 100-25 MCG/INH AEPB, Inhale 1 puff into the lungs daily., Disp: 180 each, Rfl: 3 .  furosemide (LASIX) 20 MG tablet, Take 1 tablet (20 mg total) by mouth daily as needed., Disp: 30 tablet, Rfl: 0 .  gabapentin (NEURONTIN) 100 MG capsule, Take 4 capsules (400 mg) daily in the morning and midday. Take 8 capsules (800 mg) daily in the evening., Disp: 480 capsule, Rfl: 0 .  isosorbide mononitrate (IMDUR) 30 MG 24 hr tablet, Take 1 tablet (30 mg total) by mouth once daily., Disp: , Rfl:  .  losartan (COZAAR) 100 MG tablet, Take 1 tablet (100 mg total) by mouth daily., Disp: 90 tablet, Rfl: 0 .  metoprolol tartrate (LOPRESSOR) 100 MG tablet, Take 1 tablet (100 mg total) by mouth 2 (two) times daily., Disp: 60 tablet, Rfl: 0 .  pantoprazole (PROTONIX) 40 MG  tablet, Take 1 tablet (40 mg total) by mouth 2 (two) times daily., Disp: 180 tablet, Rfl: 0 .  promethazine (PHENERGAN) 12.5 MG tablet, Take 12.5 mg by mouth every 6 (six) hours as needed for nausea or vomiting., Disp: , Rfl:  .  sertraline (ZOLOFT) 50 MG tablet, Take 1 tablet (50 mg total) by mouth daily., Disp: 30 tablet, Rfl: 0 .  vitamin B-12 (CYANOCOBALAMIN) 1000 MCG tablet, Take 1,000 mcg by mouth daily. , Disp: , Rfl:  .  isosorbide mononitrate (IMDUR) 30 MG 24 hr tablet, Take 30 mg by mouth daily. , Disp: , Rfl:  .  Tiotropium Bromide Monohydrate (SPIRIVA RESPIMAT) 1.25 MCG/ACT AERS, Inhale 2.5 mcg daily into the lungs. (Patient not taking: Reported on 03/27/2018), Disp: 4 g, Rfl: 10  Imaging Review  Cervical Imaging: Cervical MR wo contrast: No results found for this or any previous visit. Cervical MR wo contrast: No procedure  found. Cervical MR w/wo contrast: No results found for this or any previous visit. Cervical MR w contrast: No results found for this or any previous visit. Cervical CT wo contrast: No results found for this or any previous visit. Cervical CT w/wo contrast: No results found for this or any previous visit. Cervical CT w/wo contrast: No results found for this or any previous visit. Cervical CT w contrast: No results found for this or any previous visit. Cervical CT outside: No results found for this or any previous visit. Cervical DG 1 view: No results found for this or any previous visit. Cervical DG 2-3 views: No results found for this or any previous visit. Cervical DG F/E views: No results found for this or any previous visit. Cervical DG 2-3 clearing views: No results found for this or any previous visit. Cervical DG Bending/F/E views: No results found for this or any previous visit. Cervical DG complete: No results found for this or any previous visit. Cervical DG Myelogram views: No results found for this or any previous visit. Cervical DG Myelogram views: No results found for this or any previous visit. Cervical Discogram views: No results found for this or any previous visit.  Shoulder Imaging: Shoulder-R MR w contrast: No results found for this or any previous visit. Shoulder-L MR w contrast: No results found for this or any previous visit. Shoulder-R MR w/wo contrast: No results found for this or any previous visit. Shoulder-L MR w/wo contrast: No results found for this or any previous visit. Shoulder-R MR wo contrast: No results found for this or any previous visit. Shoulder-L MR wo contrast: No results found for this or any previous visit. Shoulder-R CT w contrast: No results found for this or any previous visit. Shoulder-L CT w contrast: No results found for this or any previous visit. Shoulder-R CT w/wo contrast: No results found for this or any previous visit. Shoulder-L CT  w/wo contrast: No results found for this or any previous visit. Shoulder-R CT wo contrast: No results found for this or any previous visit. Shoulder-L CT wo contrast: No results found for this or any previous visit. Shoulder-R DG Arthrogram: No results found for this or any previous visit. Shoulder-L DG Arthrogram: No results found for this or any previous visit. Shoulder-R DG 1 view: No results found for this or any previous visit. Shoulder-L DG 1 view: No results found for this or any previous visit. Shoulder-R DG: No results found for this or any previous visit. Shoulder-L DG: No results found for this or any previous visit.  Thoracic  Imaging: Thoracic MR wo contrast: No results found for this or any previous visit. Thoracic MR wo contrast: No procedure found. Thoracic MR w/wo contrast: No results found for this or any previous visit. Thoracic MR w contrast: No results found for this or any previous visit. Thoracic CT wo contrast: No results found for this or any previous visit. Thoracic CT w/wo contrast: No results found for this or any previous visit. Thoracic CT w/wo contrast: No results found for this or any previous visit. Thoracic CT w contrast: No results found for this or any previous visit. Thoracic DG 2-3 views:  Results for orders placed during the hospital encounter of 11/04/16  DG Thoracic Spine 2 View   Narrative CLINICAL DATA:  77 year old male undergoing T12 kyphoplasty.  EXAM: DG C-ARM 61-120 MIN; THORACIC SPINE 2 VIEWS  COMPARISON:  Lumbar MRI 10/23/2016  FINDINGS: Two intraoperative fluoroscopic spot views of the thoracolumbar junction in the AP and lateral projection. Bone cement in place within the compressed T12 vertebral body. The adjacent levels appear stable. No adverse features identified.  FLUOROSCOPY TIME:  2 minutes and 14 seconds  IMPRESSION: Sequelae of T12 augmentation with no adverse features identified.   Electronically Signed   By: Genevie Ann M.D.   On: 11/04/2016 15:51    Thoracic DG 4 views: No results found for this or any previous visit. Thoracic DG: No results found for this or any previous visit. Thoracic DG w/swimmers view: No results found for this or any previous visit. Thoracic DG Myelogram views: No results found for this or any previous visit. Thoracic DG Myelogram views: No results found for this or any previous visit.  Lumbosacral Imaging: Lumbar MR wo contrast:  Results for orders placed during the hospital encounter of 03/09/18  MR LUMBAR SPINE WO CONTRAST   Narrative CLINICAL DATA:  Low back and RIGHT hip pain with numbness for greater than 6 weeks. History of monoclonal gammopathy.  EXAM: MRI LUMBAR SPINE WITHOUT CONTRAST  TECHNIQUE: Multiplanar, multisequence MR imaging of the lumbar spine was performed. No intravenous contrast was administered.  COMPARISON:  MRI lumbar spine October 23, 2016  FINDINGS: SEGMENTATION: For the purposes of this report, the last well-formed intervertebral disc is reported as L5-S1.  ALIGNMENT: Maintained lumbar lordosis. Grade 1 L4-5 anterolisthesis without spondylolysis. Subacute bone marrow changes L4-5 facets. Grade 1 L1-2 and L2-3 retrolisthesis.  VERTEBRAE:Low signal within T12 moderate old burst fracture consistent with interval cement augmentation. Diffusely low bone marrow signal again noted. Moderate acute on chronic discogenic endplate changes D5-3, G9-9. Partially imaged suspected T11 compression fracture.  CONUS MEDULLARIS AND CAUDA EQUINA: Conus medullaris terminates at T12-L1 and demonstrates normal morphology and signal characteristics. Cauda equina is normal.  PARASPINAL AND OTHER SOFT TISSUES: Nonacute. Mild symmetric paraspinal muscle atrophy.  DISC LEVELS:  T11-12 (no axial images): Retropulsed bony fragments resulting in mild canal stenosis. No neural foraminal narrowing.  T12-L1: Small broad-based disc bulge. No canal stenosis or  neural foraminal narrowing.  L1-2: Retrolisthesis. Moderate disc osteophyte complex. Mild facet arthropathy and ligamentum flavum redundancy. No canal stenosis. Moderate RIGHT, mild-to-moderate LEFT neural foraminal narrowing.  L2-3: Retrolisthesis. Small broad-based disc osteophyte complex. Mild facet arthropathy and ligamentum flavum redundancy. Mild canal stenosis. Mild-to-moderate bilateral neural foraminal narrowing.  L3-4: Small broad-based disc bulge. Mild-to-moderate facet arthropathy and ligamentum flavum redundancy. Mild canal stenosis. Mild-to-moderate RIGHT, moderate to severe LEFT neural foraminal narrowing is similar to worse.  L4-5: Anterolisthesis. Unroofing of the disc with endplate spurring. Severe facet arthropathy.  Moderate canal stenosis has increased. Mild-to-moderate RIGHT, moderate LEFT neural foraminal narrowing.  L5-S1: Moderate broad-based disc osteophyte complex asymmetric to the LEFT. No canal stenosis though there is encroachment upon the traversing S1 nerves within the lateral recess. Mild LEFT neural foraminal narrowing.  IMPRESSION: 1. T11 suspected fracture, incompletely imaged. Old T12 moderate fracture, status post cement augmentation. 2. Generally decreased T1 bone marrow signal as previously noted seen with myeloproliferative disease and attributable to patient's history of monoclonal gammopathy. 3. 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 Grade 1 L1-2 and L2-3 retrolisthesis. 4. Progressed degenerative change of the lumbar spine. Moderate canal stenosis L4-5. Mild canal stenosis T11-12, L2-3 and L3-4. 5. Neural foraminal narrowing at all lumbar levels: Moderate to severe on the LEFT at L3-4.   Electronically Signed   By: Elon Alas M.D.   On: 03/09/2018 13:58    Lumbar MR wo contrast: No procedure found. Lumbar MR w/wo contrast: No results found for this or any previous visit. Lumbar MR w/wo contrast: No results  found for this or any previous visit. Lumbar MR w contrast: No results found for this or any previous visit. Lumbar CT wo contrast: No results found for this or any previous visit. Lumbar CT w/wo contrast: No results found for this or any previous visit. Lumbar CT w/wo contrast: No results found for this or any previous visit. Lumbar CT w contrast: No results found for this or any previous visit. Lumbar DG 1V: No results found for this or any previous visit. Lumbar DG 1V (Clearing): No results found for this or any previous visit. Lumbar DG 2-3V (Clearing): No results found for this or any previous visit. Lumbar DG 2-3 views: No results found for this or any previous visit. Lumbar DG (Complete) 4+V: No results found for this or any previous visit. Lumbar DG F/E views: No results found for this or any previous visit. Lumbar DG Bending views: No results found for this or any previous visit. Lumbar DG Myelogram views: No results found for this or any previous visit. Lumbar DG Myelogram: No results found for this or any previous visit. Lumbar DG Myelogram: No results found for this or any previous visit. Lumbar DG Myelogram: No results found for this or any previous visit. Lumbar DG Myelogram Lumbosacral: No results found for this or any previous visit. Lumbar DG Diskogram views: No results found for this or any previous visit. Lumbar DG Diskogram views: No results found for this or any previous visit. Lumbar DG Epidurogram OP: No results found for this or any previous visit. Lumbar DG Epidurogram IP: No results found for this or any previous visit.  Sacroiliac Joint Imaging: Sacroiliac Joint DG: No results found for this or any previous visit. Sacroiliac Joint MR w/wo contrast: No results found for this or any previous visit. Sacroiliac Joint MR wo contrast: No results found for this or any previous visit.  Spine Imaging: Whole Spine DG Myelogram views: No results found for this or any  previous visit. Whole Spine MR Mets screen: No results found for this or any previous visit. Whole Spine MR Mets screen: No results found for this or any previous visit. Whole Spine MR w/wo: No results found for this or any previous visit. MRA Spinal Canal w/ cm: No results found for this or any previous visit. MRA Spinal Canal wo/ cm: No procedure found. MRA Spinal Canal w/wo cm: No results found for this or any previous visit. Spine Outside MR Films: No results found for this  or any previous visit. Spine Outside CT Films: No results found for this or any previous visit. CT-Guided Biopsy:  Results for orders placed during the hospital encounter of 01/08/17  CT Biopsy   Narrative INDICATION: Monoclonal gammopathy  EXAM: CT BIOPSY  MEDICATIONS: None.  ANESTHESIA/SEDATION: Fentanyl 75 mcg IV; Versed 2 mg IV  Moderate Sedation Time:  14  The patient was continuously monitored during the procedure by the interventional radiology nurse under my direct supervision.  FLUOROSCOPY TIME:  None.  COMPLICATIONS: None immediate.  PROCEDURE: Informed written consent was obtained from the patient after a thorough discussion of the procedural risks, benefits and alternatives. All questions were addressed. Maximal Sterile Barrier Technique was utilized including caps, mask, sterile gowns, sterile gloves, sterile drape, hand hygiene and skin antiseptic. A timeout was performed prior to the initiation of the procedure.  Under CT guidance, an 11 gauge needle was inserted into the right iliac bone via posterior approach. Aspirates and a core were obtained.  Patient tolerated the procedure well without complication. Vital sign monitoring by nursing staff during the procedure will continue as patient is in the special procedures unit for post procedure observation.  FINDINGS: The images document guide needle placement within the right iliac bone.  IMPRESSION: Successful right iliac  bone marrow aspirate and core.   Electronically Signed   By: Marybelle Killings M.D.   On: 01/08/2017 11:27    CT-Guided Needle Placement: No results found for this or any previous visit. DG Spine outside: No results found for this or any previous visit. IR Spine outside: No results found for this or any previous visit. NM Spine outside: No results found for this or any previous visit.  Hip Imaging: Hip-R MR w contrast: No results found for this or any previous visit. Hip-L MR w contrast: No results found for this or any previous visit. Hip-R MR w/wo contrast: No results found for this or any previous visit. Hip-L MR w/wo contrast: No results found for this or any previous visit. Hip-R MR wo contrast: No results found for this or any previous visit. Hip-L MR wo contrast: No results found for this or any previous visit. Hip-R CT w contrast: No results found for this or any previous visit. Hip-L CT w contrast: No results found for this or any previous visit. Hip-R CT w/wo contrast: No results found for this or any previous visit. Hip-L CT w/wo contrast: No results found for this or any previous visit. Hip-R CT wo contrast: No results found for this or any previous visit. Hip-L CT wo contrast: No results found for this or any previous visit. Hip-R DG 2-3 views: No results found for this or any previous visit. Hip-L DG 2-3 views: No results found for this or any previous visit. Hip-R DG Arthrogram: No results found for this or any previous visit. Hip-L DG Arthrogram: No results found for this or any previous visit. Hip-B DG Bilateral: No results found for this or any previous visit.  Knee Imaging: Knee-R MR w contrast: No results found for this or any previous visit. Knee-L MR w contrast: No results found for this or any previous visit. Knee-R MR w/wo contrast: No results found for this or any previous visit. Knee-L MR w/wo contrast: No results found for this or any previous visit. Knee-R  MR wo contrast: No results found for this or any previous visit. Knee-L MR wo contrast: No results found for this or any previous visit. Knee-R CT w contrast: No results  found for this or any previous visit. Knee-L CT w contrast: No results found for this or any previous visit. Knee-R CT w/wo contrast: No results found for this or any previous visit. Knee-L CT w/wo contrast: No results found for this or any previous visit. Knee-R CT wo contrast: No results found for this or any previous visit. Knee-L CT wo contrast: No results found for this or any previous visit. Knee-R DG 1-2 views: No results found for this or any previous visit. Knee-L DG 1-2 views: No results found for this or any previous visit. Knee-R DG 3 views: No results found for this or any previous visit. Knee-L DG 3 views: No results found for this or any previous visit. Knee-R DG 4 views: No results found for this or any previous visit. Knee-L DG 4 views: No results found for this or any previous visit. Knee-R DG Arthrogram: No results found for this or any previous visit. Knee-L DG Arthrogram: No results found for this or any previous visit.  Ankle Imaging: Ankle-R DG Complete: No results found for this or any previous visit. Ankle-L DG Complete: No results found for this or any previous visit.  Foot Imaging: Foot-R DG Complete:  Results for orders placed in visit on 10/09/17  DG Foot Complete Right   Narrative CLINICAL DATA:  Right foot pain.  EXAM: RIGHT FOOT COMPLETE - 3+ VIEW  COMPARISON:  None.  FINDINGS: No acute fracture or dislocation. Old healed fracture deformities of the first, second, and third metatarsals. Mild degenerative changes of the first MTP joint. Small erosion along the medial margin of the first metatarsal head with adjacent soft tissue calcifications. Mild diffuse osteopenia. Mild dorsal forefoot soft tissue swelling.  IMPRESSION: 1. No acute osseous abnormality. Mild dorsal forefoot  soft tissue swelling. 2. Small erosion along the medial margin of the first metatarsal head with adjacent soft tissue calcifications, suspicious for gout. 3. Old, healed fractures of the first, second, and third metatarsals.   Electronically Signed   By: Titus Dubin M.D.   On: 10/09/2017 14:01    Foot-L DG Complete: No results found for this or any previous visit.  Elbow Imaging: Elbow-R DG Complete: No results found for this or any previous visit. Elbow-L DG Complete: No results found for this or any previous visit.  Wrist Imaging: Wrist-R DG Complete: No results found for this or any previous visit. Wrist-L DG Complete: No results found for this or any previous visit.  Hand Imaging: Hand-R DG Complete: No results found for this or any previous visit. Hand-L DG Complete: No results found for this or any previous visit.  Complexity Note: Imaging results reviewed. Results shared with Mr. Partain, using Layman's terms.                         ROS  Cardiovascular: {Hx; Cardiovascular History:210120525} Pulmonary or Respiratory: {Hx; Pumonary and/or Respiratory History:210120523} Neurological: {Hx; Neurological:210120504} Review of Past Neurological Studies:  Results for orders placed or performed during the hospital encounter of 10/30/17  CT Head Wo Contrast   Narrative   CLINICAL DATA:  Pt states fall one week ago, denies LOC. Hit head on right side and c/o headaches x 1 day, but is currently not experiencing any symptoms.  EXAM: CT HEAD WITHOUT CONTRAST  TECHNIQUE: Contiguous axial images were obtained from the base of the skull through the vertex without intravenous contrast.  COMPARISON:  10/31/2005  FINDINGS: Brain: No evidence of acute infarction, hemorrhage,  hydrocephalus, extra-axial collection or mass lesion/mass effect.  There is mild ventricular and sulcal enlargement consistent with age-appropriate volume loss. Patchy white matter hypoattenuation  is noted bilaterally consistent with moderate chronic microvascular ischemic change.  Vascular: No hyperdense vessel or unexpected calcification.  Skull: Normal. Negative for fracture or focal lesion.  Sinuses/Orbits: Globes and orbits are unremarkable. 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 There is mild right frontal sinus mucosal thickening with a small amount of dependent fluid. Remaining visualized sinuses are clear. Clear mastoid air cells.  Other: None.  IMPRESSION: 1. No acute intracranial abnormalities.  No skull fracture. 2. Age-appropriate volume loss and moderate chronic microvascular ischemic change.   Electronically Signed   By: Lajean Manes M.D.   On: 10/30/2017 13:57    Psychological-Psychiatric: {Hx; Psychological-Psychiatric History:210120512} Gastrointestinal: {Hx; Gastrointestinal:210120527} Genitourinary: {Hx; Genitourinary:210120506} Hematological: {Hx; Hematological:210120510} Endocrine: {Hx; Endocrine history:210120509} Rheumatologic: {Hx; Rheumatological:210120530} Musculoskeletal: {Hx; Musculoskeletal:210120528} Work History: {Hx; Work history:210120514}  Allergies  Mr. Ghee is allergic to iodinated diagnostic agents.  Laboratory Chemistry  Inflammation Markers (CRP: Acute Phase) (ESR: Chronic Phase) No results found for: CRP, ESRSEDRATE, LATICACIDVEN                       Rheumatology Markers No results found for: RF, ANA, LABURIC, URICUR, LYMEIGGIGMAB, LYMEABIGMQN, HLAB27                      Renal Function Markers Lab Results  Component Value Date   BUN 18 02/18/2018   CREATININE 1.18 02/18/2018   GFRAA >60 02/18/2018   GFRNONAA 59 (L) 02/18/2018                             Hepatic Function Markers Lab Results  Component Value Date   AST 21 02/18/2018   ALT 11 02/18/2018   ALBUMIN 3.3 (L) 02/18/2018   ALKPHOS 48 02/18/2018                        Electrolytes Lab Results  Component Value Date   NA 129 (L)  02/18/2018   K 4.5 02/18/2018   CL 100 02/18/2018   CALCIUM 9.2 02/18/2018                        Neuropathy Markers Lab Results  Component Value Date   JIRCVELF81 017 04/19/2016   HGBA1C 5.9 12/17/2017                        Bone Pathology Markers No results found for: VD25OH, VD125OH2TOT, PZ0258NI7, PO2423NT6, 25OHVITD1, 25OHVITD2, 25OHVITD3, TESTOFREE, TESTOSTERONE                       Coagulation Parameters Lab Results  Component Value Date   INR 1.11 01/08/2017   LABPROT 14.3 01/08/2017   APTT 37 (H) 01/08/2017   PLT 386 03/25/2018                        Cardiovascular Markers Lab Results  Component Value Date   TROPONINI <0.03 02/18/2018   HGB 11.4 (L) 03/25/2018   HCT 33.2 (L) 03/25/2018                         CA Markers No results found for: CEA, CA125, LABCA2  Note: Lab results reviewed.  PFSH  Drug: Mr. Iyer  reports that he does not use drugs. Alcohol:  reports that he drinks about 42.0 standard drinks of alcohol per week. Tobacco:  reports that he quit smoking about 2 years ago. His smoking use included cigarettes. He has never used smokeless tobacco. Medical:  has a past medical history of Adrenal gland anomaly, Anxiety, CAD (coronary artery disease), Carpal tunnel syndrome, Chronic hyponatremia, Colitis, Colonic polyp, COPD (chronic obstructive pulmonary disease) ([place]), Degenerative disc disease, lumbar, Depression, Diverticulosis, Dyspnea, GERD (gastroesophageal reflux disease), H/O degenerative disc disease, Hypercholesterolemia, Hyperkalemia, Hyperlipidemia, Hypertension, Irritable bowel syndrome, Monoclonal gammopathy, Monoclonal gammopathy, Neuropathy, Personal history of tobacco use, presenting hazards to health (08/17/2015), Sleep apnea, and Vertebral compression fracture ([place]). Family: family history includes Heart disease in his father; Stroke in his mother.  Past Surgical History:  Procedure Laterality Date  . BUNIONECTOMY   1989  . CARPAL TUNNEL RELEASE Left 2011   ulnar nerve sub muscular at elbow  . CHOLECYSTECTOMY  09/07  . COLONOSCOPY WITH PROPOFOL N/A 03/05/2017   Procedure: COLONOSCOPY WITH PROPOFOL;  Surgeon: Lucilla Lame, MD;  Location: [facility] ENDOSCOPY;  Service: Endoscopy;  Laterality: N/A;  . ELECTROPHYSIOLOGIC STUDY N/A 11/15/2015   Procedure: CARDIOVERSION;  Surgeon: Isaias Cowman, MD;  Location: [facility] ORS;  Service: Cardiovascular;  Laterality: N/A;  . ESOPHAGOGASTRODUODENOSCOPY (EGD) WITH PROPOFOL N/A 03/05/2017   Procedure: ESOPHAGOGASTRODUODENOSCOPY (EGD) WITH PROPOFOL;  Surgeon: Lucilla Lame, MD;  Location: [facility] ENDOSCOPY;  Service: Endoscopy;  Laterality: N/A;  . ESOPHAGOGASTRODUODENOSCOPY (EGD) WITH PROPOFOL N/A 10/17/2017   Procedure: ESOPHAGOGASTRODUODENOSCOPY (EGD) WITH PROPOFOL;  Surgeon: Lollie Sails, MD;  Location[facility] ENDOSCOPY;  Service: Endoscopy;  Laterality: N/A;  . EYE SURGERY Bilateral 2010   cataract  . HEMORRHOID SURGERY    . KYPHOPLASTY N/A 11/04/2016   Procedure: KYPHOPLASTY T 12;  Surgeon: Hessie Knows, MD;  Location: [facility] ORS;  Service: Orthopedics;  Laterality: N/A;   Active Ambulatory Problems    Diagnosis Date Noted  . Iron deficiency anemia 09/08/2012  . 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 CAD (coronary artery disease) 09/08/2012  . Hypertension 09/08/2012  . Hyponatremia 09/08/2012  . GERD (gastroesophageal reflux disease) 09/13/2012  . Sleep apnea 09/13/2012  . Ulcer 02/02/2013  . Chronic back pain 02/02/2013  . SOB (shortness of breath) 11/21/2013  . Leg pain 03/20/2014  . Collagenous colitis 09/16/2014  . Health care maintenance 10/23/2014  . Personal history of tobacco use, presenting hazards to health 08/17/2015  . Sleep difficulties 09/26/2015  . Skin lesion 01/21/2016  . Depression 07/21/2016  . Moderate COPD (chronic obstructive pulmonary disease) ([place]) 07/22/2016  . COPD exacerbation ([place]) 07/22/2016  .  History of compression fracture of spine 12/08/2016  . Alcohol abuse 01/30/2017  . Waldenstrom's macroglobulinemia ([place]) 01/31/2017  . Chest pain 03/03/2017  . Blood in stool   . Abnormal feces   . Rectal polyp   . Thoracic aortic aneurysm ([place]) 04/29/2017  . Pulmonary hypertension ([place]) 05/02/2017  . Atrial flutter ([place]) 05/02/2017  . Hyperlipemia 12/22/2013  . Constipation 10/29/2017  . Fall 10/29/2017  . Rib pain on right side 10/29/2017  . Syncope 03/05/2018   Resolved Ambulatory Problems    Diagnosis Date Noted  . Hyperkalemia 09/13/2012  . Diarrhea 05/11/2013  . Headache 05/11/2013  . Lesion of lower eyelid 11/21/2013  . Olecranon bursitis of left elbow 02/22/2015  . Nasal congestion 09/28/2015  . Cough 02/15/2016   Past Medical History:  Diagnosis Date  .  Adrenal gland anomaly   . Anxiety   . Carpal tunnel syndrome   . Chronic hyponatremia   . Colitis   . Colonic polyp   . COPD (chronic obstructive pulmonary disease) ([place])   . Degenerative disc disease, lumbar   . Diverticulosis   . Dyspnea   . H/O degenerative disc disease   . Hypercholesterolemia   . Hyperlipidemia   . Irritable bowel syndrome   . Monoclonal gammopathy   . Monoclonal gammopathy   . Neuropathy   . Vertebral compression fracture ([place])    Constitutional Exam  General appearance: Well nourished, well developed, and well hydrated. In no apparent acute distress Vitals:   04/09/18 1015  BP: (!) 184/94  Pulse: (!) 59  Resp: 16  Temp: 98.4 F (36.9 C)  TempSrc: Oral  SpO2: 93%  Weight: 186 lb (84.4 kg)  Height: _0  (1.803 m)   BMI Assessment: Estimated body mass index is 25.94 kg/m as calculated from the following:   Height as of this encounter: _1  (1.803 m).   Weight as of this encounter: 186 lb (84.4 kg).  BMI interpretation table: BMI level Category Range association with higher incidence of chronic pain  <18 kg/m2 Underweight   18.5-24.9 kg/m2 Ideal body weight    25-29.9 kg/m2 Overweight Increased incidence by 20%  30-34.9 kg/m2 Obese (Class I) Increased incidence by 68%  35-39.9 kg/m2 Severe obesity (Class II) Increased incidence by 136%  >40 kg/m2 Extreme obesity (Class III) Increased incidence by 254%   Patient's current BMI Ideal Body weight  Body mass index is 25.94 kg/m. Ideal body weight: 75.3 kg (166 lb 0.1 oz) Adjusted ideal body weight: 78.9 kg (174 lb 0.1 oz)   BMI Readings from Last 4 Encounters:  04/09/18 25.94 kg/m  03/27/18 27.17 kg/m  02/25/18 26.80 kg/m  02/18/18 25.67 kg/m   Wt Readings from Last 4 Encounters:  04/09/18 186 lb (84.4 kg)  03/27/18 189 lb 6 oz (85.9 kg)  02/25/18 186 lb 12.8 oz (84.7 kg)  02/18/18 184 lb 1.4 oz (83.5 kg)  Psych/Mental status: Alert, oriented x 3 (person, place, & time)       Eyes: PERLA Respiratory: No evidence of acute respiratory distress  Cervical Spine Area Exam  Skin & Axial Inspection: No masses, redness, edema, swelling, or associated skin lesions Alignment: Symmetrical Functional ROM: Unrestricted ROM      Stability: No instability detected Muscle Tone/Strength: Functionally intact. No obvious neuro-muscular anomalies detected. Sensory (Neurological): Unimpaired Palpation: No palpable anomalies              Upper Extremity (UE) Exam    Side: Right upper extremity  Side: Left upper extremity  Skin & Extremity Inspection: Skin color, temperature, and hair growth are WNL. No peripheral edema or cyanosis. No masses, redness, swelling, asymmetry, or associated skin lesions. No contractures.  Skin & Extremity Inspection: Skin color, temperature, and hair growth are WNL. No peripheral edema or cyanosis. No masses, redness, swelling, asymmetry, or associated skin lesions. No contractures.  Functional ROM: Unrestricted ROM          Functional ROM: Unrestricted ROM          Muscle Tone/Strength: Functionally intact. No obvious neuro-muscular anomalies detected.  Muscle Tone/Strength:  Functionally intact. No obvious neuro-muscular anomalies detected.  Sensory (Neurological): Unimpaired          Sensory (Neurological): Unimpaired          Palpation: No palpable anomalies  Palpation: No palpable anomalies              Provocative Test(s):  Phalen's test: deferred Tinel's test: deferred Apley's scratch test (touch opposite shoulder):  Action 1 (Across chest): deferred Action 2 (Overhead): deferred Action 3 (LB reach): deferred   Provocative Test(s):  Phalen's test: deferred Tinel's test: deferred Apley's scratch test (touch opposite shoulder):  Action 1 (Across chest): deferred Action 2 (Overhead): deferred Action 3 (LB reach): deferred    Thoracic Spine Area Exam  Skin & Axial Inspection: No masses, redness, or swelling Alignment: Symmetrical Functional ROM: Unrestricted ROM Stability: No instability detected Muscle Tone/Strength: Functionally intact. No obvious neuro-muscular anomalies detected. Sensory (Neurological): Unimpaired Muscle strength & Tone: No palpable anomalies  Lumbar Spine Area Exam  Skin & Axial Inspection: No masses, redness, or swelling Alignment: Symmetrical Functional ROM: Unrestricted ROM       Stability: No instability detected Muscle Tone/Strength: Functionally intact. No obvious neuro-muscular anomalies detected. Sensory (Neurological): Unimpaired Palpation: No palpable anomalies       Provocative Tests: Hyperextension/rotation test: deferred today       Lumbar quadrant test (Kemp's test): deferred today       Lateral bending test: deferred today       Patrick's Maneuver: deferred today                   FABER test: deferred today                   S-I anterior distraction/compression test: deferred today         S-I lateral compression test: deferred today         S-I Thigh-thrust test: deferred today         S-I Gaenslen's test: deferred today          Gait & Posture Assessment  Ambulation: Unassisted Gait:  Relatively normal for age and body habitus Posture: WNL   Lower Extremity Exam    Side: Right lower extremity  Side: Left lower extremity  Stability: No instability observed          Stability: No instability observed          Skin & Extremity Inspection: Skin color, temperature, and hair growth are WNL. No peripheral edema or cyanosis. No masses, redness, swelling, asymmetry, or associated skin lesions. No contractures.  Skin & Extremity Inspection: Skin color, temperature, and hair growth are WNL. No peripheral edema or cyanosis. No masses, redness, swelling, asymmetry, or associated skin lesions. No contractures.  Functional ROM: Unrestricted ROM                  Functional ROM: Unrestricted ROM                  Muscle Tone/Strength: Functionally intact. No obvious neuro-muscular anomalies detected.  Muscle Tone/Strength: Functionally intact. No obvious neuro-muscular anomalies detected.  Sensory (Neurological): Unimpaired  Sensory (Neurological): Unimpaired  Palpation: No palpable anomalies  Palpation: No palpable anomalies   Assessment  Primary Diagnosis & Pertinent Problem List: There were no encounter diagnoses.  Visit Diagnosis (New problems to examiner): No diagnosis found. Plan of Care (Initial workup plan)  Note: Please be advised that as per protocol, today's visit has been an evaluation only. We have not taken over the patient's controlled substance management.  Problem-specific plan: No problem-specific Assessment & Plan notes found for this encounter.  Ordered Lab-work, Procedure(s), Referral(s), & Consult(s): No orders of the defined types were placed in this encounter.  Pharmacotherapy (current): Medications ordered:  No orders of the defined types were placed in this encounter.  Medications administered during this visit: Clements L. Marshburn had no medications administered during this visit.   Pharmacological management options:  Opioid Analgesics: The patient was  informed that there is no guarantee that he would be a candidate for opioid analgesics. The decision will be made following CDC guidelines. This decision will be based on the results of diagnostic studies, as well as Mr. Asebedo's risk profile.   Membrane stabilizer: To be determined at a later time  Muscle relaxant: To be determined at a later time  NSAID: To be determined at a later time  Other analgesic(s): To be determined at a later time   Interventional management options: Mr. Kirtz was informed that there is no guarantee that he would be a candidate for interventional therapies. The decision will be based on the results of diagnostic studies, as well as Mr. Crosland's risk profile.  Procedure(s) under consideration:  ***   Provider-requested follow-up: No follow-ups on file.  Future Appointments  Date Time Provider [place]  04/14/2018 11:00 AM Einar Pheasant, MD [facility]  04/16/2018 10:30 AM [facility] None  04/28/2018 10:30 AM Wilhelmina [name], MD [facility] None    Primary Care Physician: Einar Pheasant, MD Location: [facility] Note by: Gillis Santa, M.D, Date: 04/09/2018; Time: 10:54 AM  There are no Patient Instructions on file for this visit.Patient's Name: DENYM RAHIMI  MRN: [id number]  Referring Provider: Marin Olp, PA-C  DOB: [birth year]  PCP: Einar Pheasant, MD  DOS: 04/09/2018  Note by: Gillis Santa, MD  Service setting: Ambulatory outpatient  Specialty: Interventional Pain Management  Location: [facility] (AMB) Pain Management Facility  Visit type: Initial Patient Evaluation  Patient type: New Patient   Primary Reason(s) for Visit: Encounter for initial evaluation of one or more chronic problems (new to examiner) potentially causing chronic pain, and posing a threat to normal musculoskeletal function. (Level of risk: High) CC: Leg Pain (right thigh)  HPI  Mr. Lata is a 77 y.o. year old, male patient, who comes today  to see [place] for the first time for an initial evaluation of his chronic pain. He has Iron deficiency anemia; MGUS (monoclonal gammopathy of unknown significance); CAD (coronary artery disease); Hypertension; Hyponatremia; GERD (gastroesophageal reflux disease); Sleep apnea; Ulcer; Chronic back pain; SOB (shortness of breath); Leg pain; Collagenous colitis; Health care maintenance; Personal history of tobacco use, presenting hazards to health; Sleep difficulties; Skin lesion; Depression; Moderate COPD (chronic obstructive pulmonary disease) ([place]); COPD exacerbation ([place]); History of compression fracture of spine; Alcohol abuse; Waldenstrom's macroglobulinemia ([place]); Chest pain; Blood in stool; Abnormal feces; Rectal polyp; Thoracic aortic aneurysm ([place]); Pulmonary hypertension ([place]); Atrial flutter ([place]); Hyperlipemia; Constipation; Fall; Rib pain on right side; and Syncope on their problem list. Today he comes in for evaluation of his Leg Pain (right thigh)  Pain Assessment: Location: Right Leg Radiating: sometimes move to hip and down to knee; knee pain manifests as throbbing spasms Onset: More than a month ago Duration: Chronic pain Quality: Aching, Sore, Constant Severity: 3 /10 (subjective, self-reported pain score)  Note: Reported level is compatible with observation.                         When using our objective Pain Scale, levels between 6 and 10/10 are said to belong in an emergency room, as it progressively worsens from a 6/10, described as  severely limiting, requiring emergency care not usually available at an outpatient pain management facility. At a 6/10 level, communication becomes difficult and requires great effort. Assistance to reach the emergency department may be required. Facial flushing and profuse sweating along with potentially dangerous increases in heart rate and blood pressure will be evident. Effect on ADL:   Timing: Constant Modifying factors: TENS unit, heating pad BP: (!)  184/94  HR: (!) 59  Onset and Duration: {Hx; Onset and Duration:210120511} Cause of pain: {Hx; Cause:210120521} Severity: {Pain Severity:210120502} Timing: {Symptoms; Timing:210120501} Aggravating Factors: {Causes; Aggravating pain factors:210120507} Alleviating Factors: {Causes; Alleviating Factors:210120500} Associated Problems: {Hx; Associated problems:210120515} Quality of Pain: {Hx; Symptom quality or Descriptor:210120531} Previous Examinations or Tests: {Hx; Previous examinations or test:210120529} Previous Treatments: {Hx; Previous Treatment:210120503}  The patient comes into the clinics today for the first time for a chronic pain management evaluation. ***  Today I took the time to provide the patient with information regarding my pain practice. The patient was informed that my practice is divided into two sections: an interventional pain management section, as well as a completely separate and distinct medication management section. I explained that I have procedure days for my interventional therapies, and evaluation days for follow-ups and medication management. Because of the amount of documentation required during both, they are kept separated. This means that there is the possibility that he may be scheduled for a procedure on one day, and medication management the next. I have also informed him that because of staffing and facility limitations, I no longer take patients for medication management only. To illustrate the reasons for this, I gave the patient the example of surgeons, and how inappropriate it would be to refer a patient to his/her care, just to write for the post-surgical antibiotics on a surgery done by a different surgeon.   Because interventional pain management is my board-certified specialty, the patient was informed that joining my practice means that they are open to any and all interventional therapies. I made it clear that this does not mean that they will be  forced to have any procedures done. What this means is that I believe interventional therapies to be essential part of the diagnosis and proper management of chronic pain conditions. Therefore, patients not interested in these interventional alternatives will be better served under the care of a different practitioner.  The patient was also made aware of my Comprehensive Pain Management Safety Guidelines where by joining my practice, they limit all of their nerve blocks and joint injections to those done by our practice, for as long as we are retained to manage their care.   Historic Controlled Substance Pharmacotherapy Review  PMP and historical list of controlled substances: ***  Highest opioid analgesic regimen found: ***  Most recent opioid analgesic: ***  Current opioid analgesics: ***  Highest recorded MME/day: *** mg/day MME/day: *** mg/day Medications: The patient did not bring the medication(s) to the appointment, as requested in our "New Patient Package" Pharmacodynamics: Desired effects: Analgesia: The patient reports >50% benefit. Reported improvement in function: The patient reports medication allows him to accomplish basic ADLs. Clinically meaningful improvement in function (CMIF): Sustained CMIF goals met Perceived effectiveness: Described as relatively effective, allowing for increase in activities of daily living (ADL) Undesirable effects: Side-effects or Adverse reactions: None reported Historical Monitoring: The patient  reports that he does not use drugs. List of all UDS Test(s): No results found for: MDMA, COCAINSCRNUR, PCPSCRNUR, [place], CANNABQUANT, [place], [place] List of other Serum/Urine Drug Screening  Test(s):  No results found for: AMPHSCRSER, BARBSCRSER, BENZOSCRSER, COCAINSCRSER, COCAINSCRNUR, PCPSCRSER, PCPQUANT, THCSCRSER, THCU, CANNABQUANT, OPIATESCRSER, OXYSCRSER, PROPOXSCRSER, ETH Historical Background Evaluation: [place] PMP: Six (6) year initial data search  conducted.             PMP NARX Score Report:  Narcotic: *** Sedative: *** Stimulant: *** [place] Department of public safety, offender search: Editor, commissioning Information) Non-contributory Risk Assessment Profile: Aberrant behavior: None observed or detected today Risk factors for fatal opioid overdose: None identified today PMP NARX Overdose Risk Score: *** Fatal overdose hazard ratio (HR): Calculation deferred Non-fatal overdose hazard ratio (HR): Calculation deferred Risk of opioid abuse or dependence: 0.7-3.0% with doses ? 36 MME/day and 6.1-26% with doses ? 120 MME/day. Substance use disorder (SUD) risk level: See below Personal History of Substance Abuse (SUD-Substance use disorder):  Alcohol: Negative  Illegal Drugs: Negative  Rx Drugs: Negative  ORT Risk Level calculation: Moderate Risk Opioid Risk Tool - 04/09/18 1028      Family History of Substance Abuse   Alcohol  Positive Male    Illegal Drugs  Negative    Rx Drugs  Negative      Personal History of Substance Abuse   Alcohol  Negative    Illegal Drugs  Negative    Rx Drugs  Negative      Age   Age between 63-45 years   No      History of Preadolescent Sexual Abuse   History of Preadolescent Sexual Abuse  Negative or Male      Psychological Disease   Psychological Disease  Negative    Depression  Positive      Total Score   Opioid Risk Tool Scoring  4    Opioid Risk Interpretation  Moderate Risk      ORT Scoring interpretation table:  Score <3 = Low Risk for SUD  Score between 4-7 = Moderate Risk for SUD  Score >8 = High Risk for Opioid Abuse   PHQ-2 Depression Scale:  Total score: 0  PHQ-2 Scoring interpretation table: (Score and probability of major depressive disorder)  Score 0 = No depression  Score 1 = 15.4% Probability  Score 2 = 21.1% Probability  Score 3 = 38.4% Probability  Score 4 = 45.5% Probability  Score 5 = 56.4% Probability  Score 6 = 78.6% Probability   PHQ-9 Depression Scale:  Total  score: 0  PHQ-9 Scoring interpretation table:  Score 0-4 = No depression  Score 5-9 = Mild depression  Score 10-14 = Moderate depression  Score 15-19 = Moderately severe depression  Score 20-27 = Severe depression (2.4 times higher risk of SUD and 2.89 times higher risk of overuse)   Pharmacologic Plan: As per protocol, I have not taken over any controlled substance management, pending the results of ordered tests and/or consults.            Initial impression: Pending review of available data and ordered tests.  Meds   Current Outpatient Medications:  .  acetaminophen (TYLENOL) 500 MG tablet, Take 500-1,000 mg by mouth 3 (three) times daily as needed for moderate pain or headache., Disp: , Rfl:  .  albuterol (PROVENTIL HFA;VENTOLIN HFA) 108 (90 Base) MCG/ACT inhaler, Inhale 2 puffs into the lungs every 6 (six) hours as needed for wheezing or shortness of breath., Disp: , Rfl:  .  alendronate (FOSAMAX) 70 MG tablet, Take 70 mg by mouth once a week. Take with a full glass of water on an empty stomach. Takes on  Sundays, Disp: , Rfl:  .  budesonide (ENTOCORT EC) 3 MG 24 hr capsule, Take 9 mg by mouth daily as needed (colitis flare). , Disp: , Rfl:  .  docusate sodium (COLACE) 100 MG capsule, , Disp: , Rfl:  .  ELIQUIS 5 MG TABS tablet, Take 5 mg by mouth 2 (two) times daily. , Disp: , Rfl:  .  FIBER PO, Take 2 capsules by mouth daily., Disp: , Rfl:  .  fluticasone (FLONASE) 50 MCG/ACT nasal spray, Place 2 sprays into both nostrils daily., Disp: 16 g, Rfl: 0 .  fluticasone furoate-vilanterol (BREO ELLIPTA) 100-25 MCG/INH AEPB, Inhale 1 puff into the lungs daily., Disp: 180 each, Rfl: 3 .  furosemide (LASIX) 20 MG tablet, Take 1 tablet (20 mg total) by mouth daily as needed., Disp: 30 tablet, Rfl: 0 .  gabapentin (NEURONTIN) 100 MG capsule, Take 4 capsules (400 mg) daily in the morning and midday. Take 8 capsules (800 mg) daily in the evening., Disp: 480 capsule, Rfl: 0 .  isosorbide mononitrate  (IMDUR) 30 MG 24 hr tablet, Take 1 tablet (30 mg total) by mouth once daily., Disp: , Rfl:  .  losartan (COZAAR) 100 MG tablet, Take 1 tablet (100 mg total) by mouth daily., Disp: 90 tablet, Rfl: 0 .  metoprolol tartrate (LOPRESSOR) 100 MG tablet, Take 1 tablet (100 mg total) by mouth 2 (two) times daily., Disp: 60 tablet, Rfl: 0 .  pantoprazole (PROTONIX) 40 MG tablet, Take 1 tablet (40 mg total) by mouth 2 (two) times daily., Disp: 180 tablet, Rfl: 0 .  promethazine (PHENERGAN) 12.5 MG tablet, Take 12.5 mg by mouth every 6 (six) hours as needed for nausea or vomiting., Disp: , Rfl:  .  sertraline (ZOLOFT) 50 MG tablet, Take 1 tablet (50 mg total) by mouth daily., Disp: 30 tablet, Rfl: 0 .  vitamin B-12 (CYANOCOBALAMIN) 1000 MCG tablet, Take 1,000 mcg by mouth daily. , Disp: , Rfl:  .  isosorbide mononitrate (IMDUR) 30 MG 24 hr tablet, Take 30 mg by mouth daily. , Disp: , Rfl:  .  Tiotropium Bromide Monohydrate (SPIRIVA RESPIMAT) 1.25 MCG/ACT AERS, Inhale 2.5 mcg daily into the lungs. (Patient not taking: Reported on 03/27/2018), Disp: 4 g, Rfl: 10  Imaging Review  Cervical Imaging: Cervical MR wo contrast: No results found for this or any previous visit. Cervical MR wo contrast: No procedure found. Cervical MR w/wo contrast: No results found for this or any previous visit. Cervical MR w contrast: No results found for this or any previous visit. Cervical CT wo contrast: No results found for this or any previous visit. Cervical CT w/wo contrast: No results found for this or any previous visit. Cervical CT w/wo contrast: No results found for this or any previous visit. Cervical CT w contrast: No results found for this or any previous visit. Cervical CT outside: No results found for this or any previous visit. Cervical DG 1 view: No results found for this or any previous visit. Cervical DG 2-3 views: No results found for this or any previous visit. Cervical DG F/E views: No results found for this  or any previous visit. Cervical DG 2-3 clearing views: No results found for this or any previous visit. Cervical DG Bending/F/E views: No results found for this or any previous visit. Cervical DG complete: No results found for this or any previous visit. Cervical DG Myelogram views: No results found for this or any previous visit. Cervical DG Myelogram views: No results found for  this or any previous visit. Cervical Discogram views: No results found for this or any previous visit.  Shoulder Imaging: Shoulder-R MR w contrast: No results found for this or any previous visit. Shoulder-L MR w contrast: No results found for this or any previous visit. Shoulder-R MR w/wo contrast: No results found for this or any previous visit. Shoulder-L MR w/wo contrast: No results found for this or any previous visit. Shoulder-R MR wo contrast: No results found for this or any previous visit. Shoulder-L MR wo contrast: No results found for this or any previous visit. Shoulder-R CT w contrast: No results found for this or any previous visit. Shoulder-L CT w contrast: No results found for this or any previous visit. Shoulder-R CT w/wo contrast: No results found for this or any previous visit. Shoulder-L CT w/wo contrast: No results found for this or any previous visit. Shoulder-R CT wo contrast: No results found for this or any previous visit. Shoulder-L CT wo contrast: No results found for this or any previous visit. Shoulder-R DG Arthrogram: No results found for this or any previous visit. Shoulder-L DG Arthrogram: No results found for this or any previous visit. Shoulder-R DG 1 view: No results found for this or any previous visit. Shoulder-L DG 1 view: No results found for this or any previous visit. Shoulder-R DG: No results found for this or any previous visit. Shoulder-L DG: No results found for this or any previous visit.  Thoracic Imaging: Thoracic MR wo contrast: No results found for this or any  previous visit. Thoracic MR wo contrast: No procedure found. Thoracic MR w/wo contrast: No results found for this or any previous visit. Thoracic MR w contrast: No results found for this or any previous visit. Thoracic CT wo contrast: No results found for this or any previous visit. Thoracic CT w/wo contrast: No results found for this or any previous visit. Thoracic CT w/wo contrast: No results found for this or any previous visit. Thoracic CT w contrast: No results found for this or any previous visit. Thoracic DG 2-3 views:  Results for orders placed during the hospital encounter of 11/04/16  DG Thoracic Spine 2 View   Narrative CLINICAL DATA:  77 year old male undergoing T12 kyphoplasty.  EXAM: DG C-ARM 61-120 MIN; THORACIC SPINE 2 VIEWS  COMPARISON:  Lumbar MRI 10/23/2016  FINDINGS: Two intraoperative fluoroscopic spot views of the thoracolumbar junction in the AP and lateral projection. Bone cement in place within the compressed T12 vertebral body. The adjacent levels appear stable. No adverse features identified.  FLUOROSCOPY TIME:  2 minutes and 14 seconds  IMPRESSION: Sequelae of T12 augmentation with no adverse features identified.   Electronically Signed   By: Genevie Ann M.D.   On: 11/04/2016 15:51    Thoracic DG 4 views: No results found for this or any previous visit. Thoracic DG: No results found for this or any previous visit. Thoracic DG w/swimmers view: No results found for this or any previous visit. Thoracic DG Myelogram views: No results found for this or any previous visit. Thoracic DG Myelogram views: No results found for this or any previous visit.  Lumbosacral Imaging: Lumbar MR wo contrast:  Results for orders placed during the hospital encounter of 03/09/18  MR LUMBAR SPINE WO CONTRAST   Narrative CLINICAL DATA:  Low back and RIGHT hip pain with numbness for greater than 6 weeks. History of monoclonal gammopathy.  EXAM: MRI LUMBAR SPINE WITHOUT  CONTRAST  TECHNIQUE: Multiplanar, multisequence MR imaging of the lumbar  spine was performed. No intravenous contrast was administered.  COMPARISON:  MRI lumbar spine October 23, 2016  FINDINGS: SEGMENTATION: For the purposes of this report, the last well-formed intervertebral disc is reported as L5-S1.  ALIGNMENT: Maintained lumbar lordosis. Grade 1 L4-5 anterolisthesis without spondylolysis. Subacute bone marrow changes L4-5 facets. Grade 1 L1-2 and L2-3 retrolisthesis.  VERTEBRAE:Low signal within T12 moderate old burst fracture consistent with interval cement augmentation. Diffusely low bone marrow signal again noted. Moderate acute on chronic discogenic endplate changes Z6-1, W9-6. Partially imaged suspected T11 compression fracture.  CONUS MEDULLARIS AND CAUDA EQUINA: Conus medullaris terminates at T12-L1 and demonstrates normal morphology and signal characteristics. Cauda equina is normal.  PARASPINAL AND OTHER SOFT TISSUES: Nonacute. Mild symmetric paraspinal muscle atrophy.  DISC LEVELS:  T11-12 (no axial images): Retropulsed bony fragments resulting in mild canal stenosis. No neural foraminal narrowing.  T12-L1: Small broad-based disc bulge. No canal stenosis or neural foraminal narrowing.  L1-2: Retrolisthesis. Moderate disc osteophyte complex. Mild facet arthropathy and ligamentum flavum redundancy. No canal stenosis. Moderate RIGHT, mild-to-moderate LEFT neural foraminal narrowing.  L2-3: Retrolisthesis. Small broad-based disc osteophyte complex. Mild facet arthropathy and ligamentum flavum redundancy. Mild canal stenosis. Mild-to-moderate bilateral neural foraminal narrowing.  L3-4: Small broad-based disc bulge. Mild-to-moderate facet arthropathy and ligamentum flavum redundancy. Mild canal stenosis. Mild-to-moderate RIGHT, moderate to severe LEFT neural foraminal narrowing is similar to worse.  L4-5: Anterolisthesis. Unroofing of the disc with endplate  spurring. Severe facet arthropathy. Moderate canal stenosis has increased. Mild-to-moderate RIGHT, moderate LEFT neural foraminal narrowing.  L5-S1: Moderate broad-based disc osteophyte complex asymmetric to the LEFT. No canal stenosis though there is encroachment upon the traversing S1 nerves within the lateral recess. Mild LEFT neural foraminal narrowing.  IMPRESSION: 1. T11 suspected fracture, incompletely imaged. Old T12 moderate fracture, status post cement augmentation. 2. Generally decreased T1 bone marrow signal as previously noted seen with myeloproliferative disease and attributable to patient's history of monoclonal gammopathy. 3. 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 Grade 1 L1-2 and L2-3 retrolisthesis. 4. Progressed degenerative change of the lumbar spine. Moderate canal stenosis L4-5. Mild canal stenosis T11-12, L2-3 and L3-4. 5. Neural foraminal narrowing at all lumbar levels: Moderate to severe on the LEFT at L3-4.   Electronically Signed   By: Elon Alas M.D.   On: 03/09/2018 13:58    Lumbar MR wo contrast: No procedure found. Lumbar MR w/wo contrast: No results found for this or any previous visit. Lumbar MR w/wo contrast: No results found for this or any previous visit. Lumbar MR w contrast: No results found for this or any previous visit. Lumbar CT wo contrast: No results found for this or any previous visit. Lumbar CT w/wo contrast: No results found for this or any previous visit. Lumbar CT w/wo contrast: No results found for this or any previous visit. Lumbar CT w contrast: No results found for this or any previous visit. Lumbar DG 1V: No results found for this or any previous visit. Lumbar DG 1V (Clearing): No results found for this or any previous visit. Lumbar DG 2-3V (Clearing): No results found for this or any previous visit. Lumbar DG 2-3 views: No results found for this or any previous visit. Lumbar DG (Complete) 4+V: No  results found for this or any previous visit. Lumbar DG F/E views: No results found for this or any previous visit. Lumbar DG Bending views: No results found for this or any previous visit. Lumbar DG Myelogram views: No results found for this or any  previous visit. Lumbar DG Myelogram: No results found for this or any previous visit. Lumbar DG Myelogram: No results found for this or any previous visit. Lumbar DG Myelogram: No results found for this or any previous visit. Lumbar DG Myelogram Lumbosacral: No results found for this or any previous visit. Lumbar DG Diskogram views: No results found for this or any previous visit. Lumbar DG Diskogram views: No results found for this or any previous visit. Lumbar DG Epidurogram OP: No results found for this or any previous visit. Lumbar DG Epidurogram IP: No results found for this or any previous visit.  Sacroiliac Joint Imaging: Sacroiliac Joint DG: No results found for this or any previous visit. Sacroiliac Joint MR w/wo contrast: No results found for this or any previous visit. Sacroiliac Joint MR wo contrast: No results found for this or any previous visit.  Spine Imaging: Whole Spine DG Myelogram views: No results found for this or any previous visit. Whole Spine MR Mets screen: No results found for this or any previous visit. Whole Spine MR Mets screen: No results found for this or any previous visit. Whole Spine MR w/wo: No results found for this or any previous visit. MRA Spinal Canal w/ cm: No results found for this or any previous visit. MRA Spinal Canal wo/ cm: No procedure found. MRA Spinal Canal w/wo cm: No results found for this or any previous visit. Spine Outside MR Films: No results found for this or any previous visit. Spine Outside CT Films: No results found for this or any previous visit. CT-Guided Biopsy:  Results for orders placed during the hospital encounter of 01/08/17  CT Biopsy   Narrative INDICATION: Monoclonal  gammopathy  EXAM: CT BIOPSY  MEDICATIONS: None.  ANESTHESIA/SEDATION: Fentanyl 75 mcg IV; Versed 2 mg IV  Moderate Sedation Time:  14  The patient was continuously monitored during the procedure by the interventional radiology nurse under my direct supervision.  FLUOROSCOPY TIME:  None.  COMPLICATIONS: None immediate.  PROCEDURE: Informed written consent was obtained from the patient after a thorough discussion of the procedural risks, benefits and alternatives. All questions were addressed. Maximal Sterile Barrier Technique was utilized including caps, mask, sterile gowns, sterile gloves, sterile drape, hand hygiene and skin antiseptic. A timeout was performed prior to the initiation of the procedure.  Under CT guidance, an 11 gauge needle was inserted into the right iliac bone via posterior approach. Aspirates and a core were obtained.  Patient tolerated the procedure well without complication. Vital sign monitoring by nursing staff during the procedure will continue as patient is in the special procedures unit for post procedure observation.  FINDINGS: The images document guide needle placement within the right iliac bone.  IMPRESSION: Successful right iliac bone marrow aspirate and core.   Electronically Signed   By: Marybelle Killings M.D.   On: 01/08/2017 11:27    CT-Guided Needle Placement: No results found for this or any previous visit. DG Spine outside: No results found for this or any previous visit. IR Spine outside: No results found for this or any previous visit. NM Spine outside: No results found for this or any previous visit.  Hip Imaging: Hip-R MR w contrast: No results found for this or any previous visit. Hip-L MR w contrast: No results found for this or any previous visit. Hip-R MR w/wo contrast: No results found for this or any previous visit. Hip-L MR w/wo contrast: No results found for this or any previous visit. Hip-R MR wo contrast:  No  results found for this or any previous visit. Hip-L MR wo contrast: No results found for this or any previous visit. Hip-R CT w contrast: No results found for this or any previous visit. Hip-L CT w contrast: No results found for this or any previous visit. Hip-R CT w/wo contrast: No results found for this or any previous visit. Hip-L CT w/wo contrast: No results found for this or any previous visit. Hip-R CT wo contrast: No results found for this or any previous visit. Hip-L CT wo contrast: No results found for this or any previous visit. Hip-R DG 2-3 views: No results found for this or any previous visit. Hip-L DG 2-3 views: No results found for this or any previous visit. Hip-R DG Arthrogram: No results found for this or any previous visit. Hip-L DG Arthrogram: No results found for this or any previous visit. Hip-B DG Bilateral: No results found for this or any previous visit.  Knee Imaging: Knee-R MR w contrast: No results found for this or any previous visit. Knee-L MR w contrast: No results found for this or any previous visit. Knee-R MR w/wo contrast: No results found for this or any previous visit. Knee-L MR w/wo contrast: No results found for this or any previous visit. Knee-R MR wo contrast: No results found for this or any previous visit. Knee-L MR wo contrast: No results found for this or any previous visit. Knee-R CT w contrast: No results found for this or any previous visit. Knee-L CT w contrast: No results found for this or any previous visit. Knee-R CT w/wo contrast: No results found for this or any previous visit. Knee-L CT w/wo contrast: No results found for this or any previous visit. Knee-R CT wo contrast: No results found for this or any previous visit. Knee-L CT wo contrast: No results found for this or any previous visit. Knee-R DG 1-2 views: No results found for this or any previous visit. Knee-L DG 1-2 views: No results found for this or any previous  visit. Knee-R DG 3 views: No results found for this or any previous visit. Knee-L DG 3 views: No results found for this or any previous visit. Knee-R DG 4 views: No results found for this or any previous visit. Knee-L DG 4 views: No results found for this or any previous visit. Knee-R DG Arthrogram: No results found for this or any previous visit. Knee-L DG Arthrogram: No results found for this or any previous visit.  Ankle Imaging: Ankle-R DG Complete: No results found for this or any previous visit. Ankle-L DG Complete: No results found for this or any previous visit.  Foot Imaging: Foot-R DG Complete:  Results for orders placed in visit on 10/09/17  DG Foot Complete Right   Narrative CLINICAL DATA:  Right foot pain.  EXAM: RIGHT FOOT COMPLETE - 3+ VIEW  COMPARISON:  None.  FINDINGS: No acute fracture or dislocation. Old healed fracture deformities of the first, second, and third metatarsals. Mild degenerative changes of the first MTP joint. Small erosion along the medial margin of the first metatarsal head with adjacent soft tissue calcifications. Mild diffuse osteopenia. Mild dorsal forefoot soft tissue swelling.  IMPRESSION: 1. No acute osseous abnormality. Mild dorsal forefoot soft tissue swelling. 2. Small erosion along the medial margin of the first metatarsal head with adjacent soft tissue calcifications, suspicious for gout. 3. Old, healed fractures of the first, second, and third metatarsals.   Electronically Signed   By: Orville Govern.D.  On: 10/09/2017 14:01    Foot-L DG Complete: No results found for this or any previous visit.  Elbow Imaging: Elbow-R DG Complete: No results found for this or any previous visit. Elbow-L DG Complete: No results found for this or any previous visit.  Wrist Imaging: Wrist-R DG Complete: No results found for this or any previous visit. Wrist-L DG Complete: No results found for this or any previous visit.  Hand  Imaging: Hand-R DG Complete: No results found for this or any previous visit. Hand-L DG Complete: No results found for this or any previous visit.  Complexity Note: Imaging results reviewed. Results shared with Mr. Stefanski, using Layman's terms.                         ROS  Cardiovascular: {Hx; Cardiovascular History:210120525} Pulmonary or Respiratory: {Hx; Pumonary and/or Respiratory History:210120523} Neurological: {Hx; Neurological:210120504} Review of Past Neurological Studies:  Results for orders placed or performed during the hospital encounter of 10/30/17  CT Head Wo Contrast   Narrative   CLINICAL DATA:  Pt states fall one week ago, denies LOC. Hit head on right side and c/o headaches x 1 day, but is currently not experiencing any symptoms.  EXAM: CT HEAD WITHOUT CONTRAST  TECHNIQUE: Contiguous axial images were obtained from the base of the skull through the vertex without intravenous contrast.  COMPARISON:  10/31/2005  FINDINGS: Brain: No evidence of acute infarction, hemorrhage, hydrocephalus, extra-axial collection or mass lesion/mass effect.  There is mild ventricular and sulcal enlargement consistent with age-appropriate volume loss. Patchy white matter hypoattenuation is noted bilaterally consistent with moderate chronic microvascular ischemic change.  Vascular: No hyperdense vessel or unexpected calcification.  Skull: Normal. Negative for fracture or focal lesion.  Sinuses/Orbits: Globes and orbits are unremarkable. 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 There is mild right frontal sinus mucosal thickening with a small amount of dependent fluid. Remaining visualized sinuses are clear. Clear mastoid air cells.  Other: None.  IMPRESSION: 1. No acute intracranial abnormalities.  No skull fracture. 2. Age-appropriate volume loss and moderate chronic microvascular ischemic change.   Electronically Signed   By: Lajean Manes M.D.   On:  10/30/2017 13:57    Psychological-Psychiatric: {Hx; Psychological-Psychiatric History:210120512} Gastrointestinal: {Hx; Gastrointestinal:210120527} Genitourinary: {Hx; Genitourinary:210120506} Hematological: {Hx; Hematological:210120510} Endocrine: {Hx; Endocrine history:210120509} Rheumatologic: {Hx; Rheumatological:210120530} Musculoskeletal: {Hx; Musculoskeletal:210120528} Work History: {Hx; Work history:210120514}  Allergies  Mr. Mucha is allergic to iodinated diagnostic agents.  Laboratory Chemistry  Inflammation Markers (CRP: Acute Phase) (ESR: Chronic Phase) No results found for: CRP, ESRSEDRATE, LATICACIDVEN                       Rheumatology Markers No results found for: RF, ANA, LABURIC, URICUR, LYMEIGGIGMAB, LYMEABIGMQN, HLAB27                      Renal Function Markers Lab Results  Component Value Date   BUN 18 02/18/2018   CREATININE 1.18 02/18/2018   GFRAA >60 02/18/2018   GFRNONAA 59 (L) 02/18/2018                             Hepatic Function Markers Lab Results  Component Value Date   AST 21 02/18/2018   ALT 11 02/18/2018   ALBUMIN 3.3 (L) 02/18/2018   ALKPHOS 48 02/18/2018  Electrolytes Lab Results  Component Value Date   NA 129 (L) 02/18/2018   K 4.5 02/18/2018   CL 100 02/18/2018   CALCIUM 9.2 02/18/2018                        Neuropathy Markers Lab Results  Component Value Date   JSRPRXYV85 929 04/19/2016   HGBA1C 5.9 12/17/2017                        Bone Pathology Markers No results found for: VD25OH, WK462MM3OTR, RN1657XU3, YB3383AN1, 25OHVITD1, 25OHVITD2, 25OHVITD3, TESTOFREE, TESTOSTERONE                       Coagulation Parameters Lab Results  Component Value Date   INR 1.11 01/08/2017   LABPROT 14.3 01/08/2017   APTT 37 (H) 01/08/2017   PLT 386 03/25/2018                        Cardiovascular Markers Lab Results  Component Value Date   TROPONINI <0.03 02/18/2018   HGB 11.4 (L) 03/25/2018    HCT 33.2 (L) 03/25/2018                         CA Markers No results found for: CEA, CA125, LABCA2                      Note: Lab results reviewed.  PFSH  Drug: Mr. Werling  reports that he does not use drugs. Alcohol:  reports that he drinks about 42.0 standard drinks of alcohol per week. Tobacco:  reports that he quit smoking about 2 years ago. His smoking use included cigarettes. He has never used smokeless tobacco. Medical:  has a past medical history of Adrenal gland anomaly, Anxiety, CAD (coronary artery disease), Carpal tunnel syndrome, Chronic hyponatremia, Colitis, Colonic polyp, COPD (chronic obstructive pulmonary disease) ([place]), Degenerative disc disease, lumbar, Depression, Diverticulosis, Dyspnea, GERD (gastroesophageal reflux disease), H/O degenerative disc disease, Hypercholesterolemia, Hyperkalemia, Hyperlipidemia, Hypertension, Irritable bowel syndrome, Monoclonal gammopathy, Monoclonal gammopathy, Neuropathy, Personal history of tobacco use, presenting hazards to health (08/17/2015), Sleep apnea, and Vertebral compression fracture ([place]). Family: family history includes Heart disease in his father; Stroke in his mother.  Past Surgical History:  Procedure Laterality Date  . BUNIONECTOMY  1989  . CARPAL TUNNEL RELEASE Left 2011   ulnar nerve sub muscular at elbow  . CHOLECYSTECTOMY  09/07  . COLONOSCOPY WITH PROPOFOL N/A 03/05/2017   Procedure: COLONOSCOPY WITH PROPOFOL;  Surgeon: Lucilla Lame, MD;  Location: [facility] ENDOSCOPY;  Service: Endoscopy;  Laterality: N/A;  . ELECTROPHYSIOLOGIC STUDY N/A 11/15/2015   Procedure: CARDIOVERSION;  Surgeon: Isaias Cowman, MD;  Location: [facility] ORS;  Service: Cardiovascular;  Laterality: N/A;  . ESOPHAGOGASTRODUODENOSCOPY (EGD) WITH PROPOFOL N/A 03/05/2017   Procedure: ESOPHAGOGASTRODUODENOSCOPY (EGD) WITH PROPOFOL;  Surgeon: Lucilla Lame, MD;  Location: [facility] ENDOSCOPY;  Service: Endoscopy;  Laterality: N/A;  . ESOPHAGOGASTRODUODENOSCOPY  (EGD) WITH PROPOFOL N/A 10/17/2017   Procedure: ESOPHAGOGASTRODUODENOSCOPY (EGD) WITH PROPOFOL;  Surgeon: Lollie Sails, MD;  Location: [facility] ENDOSCOPY;  Service: Endoscopy;  Laterality: N/A;  . EYE SURGERY Bilateral 2010   cataract  . HEMORRHOID SURGERY    . KYPHOPLASTY N/A 11/04/2016   Procedure: KYPHOPLASTY T 12;  Surgeon: Hessie Knows, MD;  Location: [facility] ORS;  Service: Orthopedics;  Laterality: N/A;   Active Ambulatory Problems  Diagnosis Date Noted  . Iron deficiency anemia 09/08/2012  . 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 CAD (coronary artery disease) 09/08/2012  . Hypertension 09/08/2012  . Hyponatremia 09/08/2012  . GERD (gastroesophageal reflux disease) 09/13/2012  . Sleep apnea 09/13/2012  . Ulcer 02/02/2013  . Chronic back pain 02/02/2013  . SOB (shortness of breath) 11/21/2013  . Leg pain 03/20/2014  . Collagenous colitis 09/16/2014  . Health care maintenance 10/23/2014  . Personal history of tobacco use, presenting hazards to health 08/17/2015  . Sleep difficulties 09/26/2015  . Skin lesion 01/21/2016  . Depression 07/21/2016  . Moderate COPD (chronic obstructive pulmonary disease) ([place]) 07/22/2016  . COPD exacerbation ([place]) 07/22/2016  . History of compression fracture of spine 12/08/2016  . Alcohol abuse 01/30/2017  . 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 Chest pain 03/03/2017  . Blood in stool   . Abnormal feces   . Rectal polyp   . Thoracic aortic aneurysm ([place]) 04/29/2017  . Pulmonary hypertension ([place]) 05/02/2017  . Atrial flutter ([place]) 05/02/2017  . Hyperlipemia 12/22/2013  . Constipation 10/29/2017  . Fall 10/29/2017  . Rib pain on right side 10/29/2017  . Syncope 03/05/2018   Resolved Ambulatory Problems    Diagnosis Date Noted  . Hyperkalemia 09/13/2012  . Diarrhea 05/11/2013  . Headache 05/11/2013  . Lesion of lower eyelid 11/21/2013  . Olecranon bursitis of left elbow 02/22/2015  . Nasal  congestion 09/28/2015  . Cough 02/15/2016   Past Medical History:  Diagnosis Date  . Adrenal gland anomaly   . Anxiety   . Carpal tunnel syndrome   . Chronic hyponatremia   . Colitis   . Colonic polyp   . COPD (chronic obstructive pulmonary disease) ([place])   . Degenerative disc disease, lumbar   . Diverticulosis   . Dyspnea   . H/O degenerative disc disease   . Hypercholesterolemia   . Hyperlipidemia   . Irritable bowel syndrome   . Monoclonal gammopathy   . Monoclonal gammopathy   . Neuropathy   . Vertebral compression fracture ([place])    Constitutional Exam  General appearance: Well nourished, well developed, and well hydrated. In no apparent acute distress Vitals:   04/09/18 1015  BP: (!) 184/94  Pulse: (!) 59  Resp: 16  Temp: 98.4 F (36.9 C)  TempSrc: Oral  SpO2: 93%  Weight: 186 lb (84.4 kg)  Height: _0  (1.803 m)   BMI Assessment: Estimated body mass index is 25.94 kg/m as calculated from the following:   Height as of this encounter: _1  (1.803 m).   Weight as of this encounter: 186 lb (84.4 kg).  BMI interpretation table: BMI level Category Range association with higher incidence of chronic pain  <18 kg/m2 Underweight   18.5-24.9 kg/m2 Ideal body weight   25-29.9 kg/m2 Overweight Increased incidence by 20%  30-34.9 kg/m2 Obese (Class I) Increased incidence by 68%  35-39.9 kg/m2 Severe obesity (Class II) Increased incidence by 136%  >40 kg/m2 Extreme obesity (Class III) Increased incidence by 254%   Patient's current BMI Ideal Body weight  Body mass index is 25.94 kg/m. Ideal body weight: 75.3 kg (166 lb 0.1 oz) Adjusted ideal body weight: 78.9 kg (174 lb 0.1 oz)   BMI Readings from Last 4 Encounters:  04/09/18 25.94 kg/m  03/27/18 27.17 kg/m  02/25/18 26.80 kg/m  02/18/18 25.67 kg/m   Wt Readings from Last 4 Encounters:  04/09/18 186 lb (84.4 kg)  03/27/18 189 lb 6 oz (85.9 kg)  02/25/18 186 lb 12.8 oz (84.7 kg)  02/18/18 184 lb 1.4 oz  (83.5 kg)  Psych/Mental status: Alert, oriented x 3 (person, place, & time)       Eyes: PERLA Respiratory: No evidence of acute respiratory distress  Cervical Spine Area Exam  Skin & Axial Inspection: No masses, redness, edema, swelling, or associated skin lesions Alignment: Symmetrical Functional ROM: Unrestricted ROM      Stability: No instability detected Muscle Tone/Strength: Functionally intact. No obvious neuro-muscular anomalies detected. Sensory (Neurological): Unimpaired Palpation: No palpable anomalies              Upper Extremity (UE) Exam    Side: Right upper extremity  Side: Left upper extremity  Skin & Extremity Inspection: Skin color, temperature, and hair growth are WNL. No peripheral edema or cyanosis. No masses, redness, swelling, asymmetry, or associated skin lesions. No contractures.  Skin & Extremity Inspection: Skin color, temperature, and hair growth are WNL. No peripheral edema or cyanosis. No masses, redness, swelling, asymmetry, or associated skin lesions. No contractures.  Functional ROM: Unrestricted ROM          Functional ROM: Unrestricted ROM          Muscle Tone/Strength: Functionally intact. No obvious neuro-muscular anomalies detected.  Muscle Tone/Strength: Functionally intact. No obvious neuro-muscular anomalies detected.  Sensory (Neurological): Unimpaired          Sensory (Neurological): Unimpaired          Palpation: No palpable anomalies              Palpation: No palpable anomalies              Provocative Test(s):  Phalen's test: deferred Tinel's test: deferred Apley's scratch test (touch opposite shoulder):  Action 1 (Across chest): deferred Action 2 (Overhead): deferred Action 3 (LB reach): deferred   Provocative Test(s):  Phalen's test: deferred Tinel's test: deferred Apley's scratch test (touch opposite shoulder):  Action 1 (Across chest): deferred Action 2 (Overhead): deferred Action 3 (LB reach): deferred    Thoracic Spine Area Exam   Skin & Axial Inspection: No masses, redness, or swelling Alignment: Symmetrical Functional ROM: Unrestricted ROM Stability: No instability detected Muscle Tone/Strength: Functionally intact. No obvious neuro-muscular anomalies detected. Sensory (Neurological): Unimpaired Muscle strength & Tone: No palpable anomalies  Lumbar Spine Area Exam  Skin & Axial Inspection: No masses, redness, or swelling Alignment: Symmetrical Functional ROM: Unrestricted ROM       Stability: No instability detected Muscle Tone/Strength: Functionally intact. No obvious neuro-muscular anomalies detected. Sensory (Neurological): Unimpaired Palpation: No palpable anomalies       Provocative Tests: Hyperextension/rotation test: deferred today       Lumbar quadrant test (Kemp's test): deferred today       Lateral bending test: deferred today       Patrick's Maneuver: deferred today                   FABER test: deferred today                   S-I anterior distraction/compression test: deferred today         S-I lateral compression test: deferred today         S-I Thigh-thrust test: deferred today         S-I Gaenslen's test: deferred today          Gait & Posture Assessment  Ambulation: Unassisted Gait: Relatively normal for age and body habitus Posture: WNL  Lower Extremity Exam    Side: Right lower extremity  Side: Left lower extremity  Stability: No instability observed          Stability: No instability observed          Skin & Extremity Inspection: Skin color, temperature, and hair growth are WNL. No peripheral edema or cyanosis. No masses, redness, swelling, asymmetry, or associated skin lesions. No contractures.  Skin & Extremity Inspection: Skin color, temperature, and hair growth are WNL. No peripheral edema or cyanosis. No masses, redness, swelling, asymmetry, or associated skin lesions. No contractures.  Functional ROM: Unrestricted ROM                  Functional ROM: Unrestricted ROM                   Muscle Tone/Strength: Functionally intact. No obvious neuro-muscular anomalies detected.  Muscle Tone/Strength: Functionally intact. No obvious neuro-muscular anomalies detected.  Sensory (Neurological): Unimpaired  Sensory (Neurological): Unimpaired  Palpation: No palpable anomalies  Palpation: No palpable anomalies   Assessment  Primary Diagnosis & Pertinent Problem List: There were no encounter diagnoses.  Visit Diagnosis (New problems to examiner): No diagnosis found. Plan of Care (Initial workup plan)  Note: Please be advised that as per protocol, today's visit has been an evaluation only. We have not taken over the patient's controlled substance management.  Problem-specific plan: No problem-specific Assessment & Plan notes found for this encounter.  Ordered Lab-work, Procedure(s), Referral(s), & Consult(s): No orders of the defined types were placed in this encounter.  Pharmacotherapy (current): Medications ordered:  No orders of the defined types were placed in this encounter.  Medications administered during this visit: Sherry L. Briere had no medications administered during this visit.   Pharmacological management options:  Opioid Analgesics: The patient was informed that there is no guarantee that he would be a candidate for opioid analgesics. The decision will be made following CDC guidelines. This decision will be based on the results of diagnostic studies, as well as Mr. Beazley's risk profile.   Membrane stabilizer: To be determined at a later time  Muscle relaxant: To be determined at a later time  NSAID: To be determined at a later time  Other analgesic(s): To be determined at a later time   Interventional management options: Mr. Abdou was informed that there is no guarantee that he would be a candidate for interventional therapies. The decision will be based on the results of diagnostic studies, as well as Mr. Cobarrubias's risk profile.  Procedure(s) under  consideration:  ***   Provider-requested follow-up: No follow-ups on file.  Future Appointments  Date Time Provider [place]  04/14/2018 11:00 AM Einar Pheasant, MD [id number]  04/16/2018 10:30 AM [facility] None  04/28/2018 10:30 AM Wilhelmina [name], MD [id number] None    Primary Care Physician: Einar Pheasant, MD Location: [facility] Note by: Gillis Santa, M.D, Date: 04/09/2018; Time: 10:54 AM  There are no Patient Instructions on file for this visit.

## 2018-04-09 NOTE — Progress Notes (Signed)
Patient's Name: Troy Lopez  MRN: [id number]  Referring Provider: Marin Olp, PA-C  DOB: [birth year]  PCP: Einar Pheasant, MD  DOS: 04/09/2018  Note by: Gillis Santa, MD  Service setting: Ambulatory outpatient  Specialty: Interventional Pain Management  Location: [facility] (AMB) Pain Management Facility  Visit type: Initial Patient Evaluation  Patient type: New Patient   Primary Reason(s) for Visit: Encounter for initial evaluation of one or more chronic problems (new to examiner) potentially causing chronic pain, and posing a threat to normal musculoskeletal function. (Level of risk: High) CC: Leg Pain (right thigh)  HPI  [name] is a 77 y.o. year old, male patient, who comes today to see [place] for the first time for an initial evaluation of his chronic pain. He has Iron deficiency anemia; MGUS (monoclonal gammopathy of unknown significance); CAD (coronary artery disease); Hypertension; Hyponatremia; GERD (gastroesophageal reflux disease); Sleep apnea; Ulcer; Chronic back pain; SOB (shortness of breath); Leg pain; Collagenous colitis; Health care maintenance; Personal history of tobacco use, presenting hazards to health; Sleep difficulties; Skin lesion; Depression; Moderate COPD (chronic obstructive pulmonary disease) ([place]); COPD exacerbation ([place]); History of compression fracture of spine; Alcohol abuse; Waldenstrom's macroglobulinemia ([place]); Chest pain; Blood in stool; Abnormal feces; Rectal polyp; Thoracic aortic aneurysm ([place]); Pulmonary hypertension ([place]); Atrial flutter ([place]); Hyperlipemia; Constipation; Fall; Rib pain on right side; and Syncope on their problem list. Today he comes in for evaluation of his Leg Pain (right thigh)  Pain Assessment: Location: Right Leg Radiating: sometimes move to hip and down to knee; knee pain manifests as throbbing spasms Onset: More than a month ago Duration: Chronic pain Quality: Aching, Sore, Constant Severity: 3 /10 (subjective, self-reported pain  score)  Note: Reported level is compatible with observation.                         When using our objective Pain Scale, levels between 6 and 10/10 are said to belong in an emergency room, as it progressively worsens from a 6/10, described as severely limiting, requiring emergency care not usually available at an outpatient pain management facility. At a 6/10 level, communication becomes difficult and requires great effort. Assistance to reach the emergency department may be required. Facial flushing and profuse sweating along with potentially dangerous increases in heart rate and blood pressure will be evident. Effect on ADL:   Timing: Constant Modifying factors: TENS unit, heating pad BP: (!) 184/94  HR: (!) 59  Onset and Duration: Gradual and Present less than 3 months Cause of pain: Unknown Severity: Getting worse, NAS-11 at its worse: 7/10, NAS-11 at its best: 7/10, NAS-11 now: 3/10 and NAS-11 on the average: 5/10 Timing: Not influenced by the time of the day and During activity or exercise Aggravating Factors: Prolonged sitting, Prolonged standing, Walking, Walking uphill and Walking downhill Alleviating Factors: Hot packs, Lying down and TENS Associated Problems: Fatigue, Spasms, Tingling and Pain that wakes patient up Quality of Pain: Aching, Burning, Pulsating, Sharp, Shooting, Throbbing and Tingling Previous Examinations or Tests: MRI scan, X-rays and Orthopedic evaluation Previous Treatments: Physical Therapy, Steroid treatments by mouth and TENS  The patient comes into the clinics today for the first time for a chronic pain management evaluation.   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  Patient describes the majority of his pain along the anterior and medial portion of his  hip and groin region.  Patient is referred here  from Dr. Lacinda Axon with [place] [facility] for consideration of a right L1-L2 epidural steroid injection.  Patient denies receiving an epidural steroid injection in the past.  Of note he has tried lumbar facet medial branch nerve blocks with radiofrequency ablation for his axial low back pain which was not very effective.  This was done with physical medicine and rehab at [facility].  Patient denies any bowel or bladder dysfunction.  Patient does have a history of a fall in 2018 which resulted in a compression fracture and is status post T12 kyphoplasty.  Note: PMP checked and appropriate  ORT Risk Level calculation: Moderate Risk Opioid Risk Tool - 04/09/18 1028      Family History of Substance Abuse   Alcohol  Positive Male    Illegal Drugs  Negative    Rx Drugs  Negative      Personal History of Substance Abuse   Alcohol  Negative    Illegal Drugs  Negative    Rx Drugs  Negative      Age   Age between 64-45 years   No      History of Preadolescent Sexual Abuse   History of Preadolescent Sexual Abuse  Negative or Male      Psychological Disease   Psychological Disease  Negative    Depression  Positive      Total Score   Opioid Risk Tool Scoring  4    Opioid Risk Interpretation  Moderate Risk      ORT Scoring interpretation table:  Score <3 = Low Risk for SUD  Score between 4-7 = Moderate Risk for SUD  Score >8 = High Risk for Opioid Abuse   PHQ-2 Depression Scale:  Total score: 0  PHQ-2 Scoring interpretation table: (Score and probability of major depressive disorder)  Score 0 = No depression  Score 1 = 15.4% Probability  Score 2 = 21.1% Probability  Score 3 = 38.4% Probability  Score 4 = 45.5% Probability  Score 5 = 56.4% Probability  Score 6 = 78.6% Probability   PHQ-9 Depression Scale:  Total score: 0  PHQ-9 Scoring interpretation table:  Score 0-4 = No depression  Score 5-9 = Mild depression  Score 10-14 = Moderate depression  Score 15-19 = Moderately severe  depression  Score 20-27 = Severe depression (2.4 times higher risk of SUD and 2.89 times higher risk of overuse)    Meds   Current Outpatient Medications:  .  acetaminophen (TYLENOL) 500 MG tablet, Take 500-1,000 mg by mouth 3 (three) times daily as needed for moderate pain or headache., Disp: , Rfl:  .  albuterol (PROVENTIL HFA;VENTOLIN HFA) 108 (90 Base) MCG/ACT inhaler, Inhale 2 puffs into the lungs every 6 (six) hours as needed for wheezing or shortness of breath., Disp: , Rfl:  .  alendronate (FOSAMAX) 70 MG tablet, Take 70 mg by mouth once a week. Take with a full glass of water on an empty stomach. Takes on Sundays, Disp: , Rfl:  .  budesonide (ENTOCORT EC) 3 MG 24 hr capsule, Take 9 mg by mouth daily as needed (colitis flare). , Disp: , Rfl:  .  docusate sodium (COLACE) 100 MG capsule, , Disp: , Rfl:  .  ELIQUIS 5 MG TABS tablet, Take 5 mg by mouth 2 (two) times daily. , Disp: , Rfl:  .  FIBER PO, Take 2 capsules by mouth daily., Disp: , Rfl:  .  fluticasone (FLONASE) 50  MCG/ACT nasal spray, Place 2 sprays into both nostrils daily., Disp: 16 g, Rfl: 0 .  fluticasone furoate-vilanterol (BREO ELLIPTA) 100-25 MCG/INH AEPB, Inhale 1 puff into the lungs daily., Disp: 180 each, Rfl: 3 .  furosemide (LASIX) 20 MG tablet, Take 1 tablet (20 mg total) by mouth daily as needed., Disp: 30 tablet, Rfl: 0 .  gabapentin (NEURONTIN) 100 MG capsule, Take 4 capsules (400 mg) daily in the morning and midday. Take 8 capsules (800 mg) daily in the evening., Disp: 480 capsule, Rfl: 0 .  isosorbide mononitrate (IMDUR) 30 MG 24 hr tablet, Take 1 tablet (30 mg total) by mouth once daily., Disp: , Rfl:  .  losartan (COZAAR) 100 MG tablet, Take 1 tablet (100 mg total) by mouth daily., Disp: 90 tablet, Rfl: 0 .  metoprolol tartrate (LOPRESSOR) 100 MG tablet, Take 1 tablet (100 mg total) by mouth 2 (two) times daily., Disp: 60 tablet, Rfl: 0 .  pantoprazole (PROTONIX) 40 MG tablet, Take 1 tablet (40 mg total) by  mouth 2 (two) times daily., Disp: 180 tablet, Rfl: 0 .  promethazine (PHENERGAN) 12.5 MG tablet, Take 12.5 mg by mouth every 6 (six) hours as needed for nausea or vomiting., Disp: , Rfl:  .  sertraline (ZOLOFT) 50 MG tablet, Take 1 tablet (50 mg total) by mouth daily., Disp: 30 tablet, Rfl: 0 .  vitamin B-12 (CYANOCOBALAMIN) 1000 MCG tablet, Take 1,000 mcg by mouth daily. , Disp: , Rfl:  .  isosorbide mononitrate (IMDUR) 30 MG 24 hr tablet, Take 30 mg by mouth daily. , Disp: , Rfl:  .  Tiotropium Bromide Monohydrate (SPIRIVA RESPIMAT) 1.25 MCG/ACT AERS, Inhale 2.5 mcg daily into the lungs. (Patient not taking: Reported on 03/27/2018), Disp: 4 g, Rfl: 10  Imaging Review  Thoracic DG 2-3 views:  Results for orders placed during the hospital encounter of 11/04/16  DG Thoracic Spine 2 View   Narrative CLINICAL DATA:  77 year old male undergoing T12 kyphoplasty.  EXAM: DG C-ARM 61-120 MIN; THORACIC SPINE 2 VIEWS  COMPARISON:  Lumbar MRI 10/23/2016  FINDINGS: Two intraoperative fluoroscopic spot views of the thoracolumbar junction in the AP and lateral projection. Bone cement in place within the compressed T12 vertebral body. The adjacent levels appear stable. No adverse features identified.  FLUOROSCOPY TIME:  2 minutes and 14 seconds  IMPRESSION: Sequelae of T12 augmentation with no adverse features identified.   Electronically Signed   By: Genevie Ann M.D.   On: 11/04/2016 15:51     Lumbosacral Imaging: Lumbar MR wo contrast:  Results for orders placed during the hospital encounter of 03/09/18  MR LUMBAR SPINE WO CONTRAST   Narrative CLINICAL DATA:  Low back and RIGHT hip pain with numbness for greater than 6 weeks. History of monoclonal gammopathy.  EXAM: MRI LUMBAR SPINE WITHOUT CONTRAST  TECHNIQUE: Multiplanar, multisequence MR imaging of the lumbar spine was performed. No intravenous contrast was administered.  COMPARISON:  MRI lumbar spine October 23, 2016  FINDINGS: SEGMENTATION: For the purposes of this report, the last well-formed intervertebral disc is reported as L5-S1.  ALIGNMENT: Maintained lumbar lordosis. Grade 1 L4-5 anterolisthesis without spondylolysis. Subacute bone marrow changes L4-5 facets. Grade 1 L1-2 and L2-3 retrolisthesis.  VERTEBRAE:Low signal within T12 moderate old burst fracture consistent with interval cement augmentation. Diffusely low bone marrow signal again noted. Moderate acute on chronic discogenic endplate changes V8-9, F8-1. Partially imaged suspected T11 compression fracture.  CONUS MEDULLARIS AND CAUDA EQUINA: Conus medullaris terminates at T12-L1 and demonstrates normal  morphology and signal characteristics. Cauda equina is normal.  PARASPINAL AND OTHER SOFT TISSUES: Nonacute. Mild symmetric paraspinal muscle atrophy.  DISC LEVELS:  T11-12 (no axial images): Retropulsed bony fragments resulting in mild canal stenosis. No neural foraminal narrowing.  T12-L1: Small broad-based disc bulge. No canal stenosis or neural foraminal narrowing.  L1-2: Retrolisthesis. Moderate disc osteophyte complex. Mild facet arthropathy and ligamentum flavum redundancy. No canal stenosis. Moderate RIGHT, mild-to-moderate LEFT neural foraminal narrowing.  L2-3: Retrolisthesis. Small broad-based disc osteophyte complex. Mild facet arthropathy and ligamentum flavum redundancy. Mild canal stenosis. Mild-to-moderate bilateral neural foraminal narrowing.  L3-4: Small broad-based disc bulge. Mild-to-moderate facet arthropathy and ligamentum flavum redundancy. Mild canal stenosis. Mild-to-moderate RIGHT, moderate to severe LEFT neural foraminal narrowing is similar to worse.  L4-5: Anterolisthesis. Unroofing of the disc with endplate spurring. Severe facet arthropathy. Moderate canal stenosis has increased. Mild-to-moderate RIGHT, moderate LEFT neural foraminal narrowing.  L5-S1: Moderate broad-based disc  osteophyte complex asymmetric to the LEFT. No canal stenosis though there is encroachment upon the traversing S1 nerves within the lateral recess. Mild LEFT neural foraminal narrowing.  IMPRESSION: 1. T11 suspected fracture, incompletely imaged. Old T12 moderate fracture, status post cement augmentation. 2. Generally decreased T1 bone marrow signal as previously noted seen with myeloproliferative disease and attributable to patient's history of monoclonal gammopathy. 3. 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 Grade 1 L1-2 and L2-3 retrolisthesis. 4. Progressed degenerative change of the lumbar spine. Moderate canal stenosis L4-5. Mild canal stenosis T11-12, L2-3 and L3-4. 5. Neural foraminal narrowing at all lumbar levels: Moderate to severe on the LEFT at L3-4.   Electronically Signed   By: Elon Alas M.D.   On: 03/09/2018 13:58     Results for orders placed during the hospital encounter of 01/08/17  CT Biopsy   Narrative INDICATION: Monoclonal gammopathy  EXAM: CT BIOPSY  MEDICATIONS: None.  ANESTHESIA/SEDATION: Fentanyl 75 mcg IV; Versed 2 mg IV  Moderate Sedation Time:  14  The patient was continuously monitored during the procedure by the interventional radiology nurse under my direct supervision.  FLUOROSCOPY TIME:  None.  COMPLICATIONS: None immediate.  PROCEDURE: Informed written consent was obtained from the patient after a thorough discussion of the procedural risks, benefits and alternatives. All questions were addressed. Maximal Sterile Barrier Technique was utilized including caps, mask, sterile gowns, sterile gloves, sterile drape, hand hygiene and skin antiseptic. A timeout was performed prior to the initiation of the procedure.  Under CT guidance, an 11 gauge needle was inserted into the right iliac bone via posterior approach. Aspirates and a core were obtained.  Patient tolerated the procedure well without complication.  Vital sign monitoring by nursing staff during the procedure will continue as patient is in the special procedures unit for post procedure observation.  FINDINGS: The images document guide needle placement within the right iliac bone.  IMPRESSION: Successful right iliac bone marrow aspirate and core.   Electronically Signed   By: Marybelle Killings M.D.   On: 01/08/2017 11:27   Ankle-L DG Complete: No results found for this or any previous visit.  Foot Imaging: Foot-R DG Complete:  Results for orders placed in visit on 10/09/17  DG Foot Complete Right   Narrative CLINICAL DATA:  Right foot pain.  EXAM: RIGHT FOOT COMPLETE - 3+ VIEW  COMPARISON:  None.  FINDINGS: No acute fracture or dislocation. Old healed fracture deformities of the first, second, and third metatarsals. Mild degenerative changes of the first MTP joint. Small erosion along the medial margin  of the first metatarsal head with adjacent soft tissue calcifications. Mild diffuse osteopenia. Mild dorsal forefoot soft tissue swelling.  IMPRESSION: 1. No acute osseous abnormality. Mild dorsal forefoot soft tissue swelling. 2. Small erosion along the medial margin of the first metatarsal head with adjacent soft tissue calcifications, suspicious for gout. 3. Old, healed fractures of the first, second, and third metatarsals.   Electronically Signed   By: Titus Dubin M.D.   On: 10/09/2017 14:01     Complexity Note: Imaging results reviewed. Results shared with Mr. Saraceno, using Layman's terms.                         ROS  Cardiovascular: Heart trouble, Abnormal heart rhythm, High blood pressure and Blood thinners:  Anticoagulant Pulmonary or Respiratory: Lung problems, Difficulty blowing air out (Emphysema), Shortness of breath and Snoring  Neurological: Abnormal skin sensations (Peripheral Neuropathy) and Curved spine Review of Past Neurological Studies:  Results for orders placed or performed during the  hospital encounter of 10/30/17  CT Head Wo Contrast   Narrative   CLINICAL DATA:  Pt states fall one week ago, denies LOC. Hit head on right side and c/o headaches x 1 day, but is currently not experiencing any symptoms.  EXAM: CT HEAD WITHOUT CONTRAST  TECHNIQUE: Contiguous axial images were obtained from the base of the skull through the vertex without intravenous contrast.  COMPARISON:  10/31/2005  FINDINGS: Brain: No evidence of acute infarction, hemorrhage, hydrocephalus, extra-axial collection or mass lesion/mass effect.  There is mild ventricular and sulcal enlargement consistent with age-appropriate volume loss. Patchy white matter hypoattenuation is noted bilaterally consistent with moderate chronic microvascular ischemic change.  Vascular: No hyperdense vessel or unexpected calcification.  Skull: Normal. Negative for fracture or focal lesion.  Sinuses/Orbits: Globes and orbits are unremarkable. 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 There is mild right frontal sinus mucosal thickening with a small amount of dependent fluid. Remaining visualized sinuses are clear. Clear mastoid air cells.  Other: None.  IMPRESSION: 1. No acute intracranial abnormalities.  No skull fracture. 2. Age-appropriate volume loss and moderate chronic microvascular ischemic change.   Electronically Signed   By: Lajean Manes M.D.   On: 10/30/2017 13:57    Psychological-Psychiatric: Depressed Gastrointestinal: Vomiting blood (Ulcers) and Alternating episodes iof diarrhea and constipation (IBS-Irritable bowe syndrome) Genitourinary: No reported renal or genitourinary signs or symptoms such as difficulty voiding or producing urine, peeing blood, non-functioning kidney, kidney stones, difficulty emptying the bladder, difficulty controlling the flow of urine, or chronic kidney disease Hematological: Weakness due to low blood hemoglobin or red blood cell count (Anemia) and  Brusing easily Endocrine: No reported endocrine signs or symptoms such as high or low blood sugar, rapid heart rate due to high thyroid levels, obesity or weight gain due to slow thyroid or thyroid disease Rheumatologic: No reported rheumatological signs and symptoms such as fatigue, joint pain, tenderness, swelling, redness, heat, stiffness, decreased range of motion, with or without associated rash Musculoskeletal: Negative for myasthenia gravis, muscular dystrophy, multiple sclerosis or malignant hyperthermia Work History: Retired  Allergies  Mr. Guilmette is allergic to iodinated diagnostic agents.  Laboratory Chemistry  Inflammation Markers (CRP: Acute Phase) (ESR: Chronic Phase) No results found for: CRP, ESRSEDRATE, LATICACIDVEN                       Rheumatology Markers No results found for: RF, ANA, LABURIC, [place], [place], LYMEABIGMQN, HLAB27  Renal Function Markers Lab Results  Component Value Date   BUN 18 02/18/2018   CREATININE 1.18 02/18/2018   GFRAA >60 02/18/2018   GFRNONAA 59 (L) 02/18/2018                             Hepatic Function Markers Lab Results  Component Value Date   AST 21 02/18/2018   ALT 11 02/18/2018   ALBUMIN 3.3 (L) 02/18/2018   ALKPHOS 48 02/18/2018                        Electrolytes Lab Results  Component Value Date   NA 129 (L) 02/18/2018   K 4.5 02/18/2018   CL 100 02/18/2018   CALCIUM 9.2 02/18/2018                        Neuropathy Markers Lab Results  Component Value Date   DDUKGURK27 062 04/19/2016   HGBA1C 5.9 12/17/2017                        Bone Pathology Markers No results found for: VD25OH, VD125OH2TOT, BJ6283TD1, VO1607PX1, 25OHVITD1, 25OHVITD2, 25OHVITD3, TESTOFREE, TESTOSTERONE                       Coagulation Parameters Lab Results  Component Value Date   INR 1.11 01/08/2017   LABPROT 14.3 01/08/2017   APTT 37 (H) 01/08/2017   PLT 386 03/25/2018                         Cardiovascular Markers Lab Results  Component Value Date   TROPONINI <0.03 02/18/2018   HGB 11.4 (L) 03/25/2018   HCT 33.2 (L) 03/25/2018                         CA Markers No results found for: CEA, CA125, LABCA2                      Note: Lab results reviewed.  PFSH  Drug: Mr. Badley  reports that he does not use drugs. Alcohol:  reports that he drinks about 42.0 standard drinks of alcohol per week. Tobacco:  reports that he quit smoking about 2 years ago. His smoking use included cigarettes. He has never used smokeless tobacco. Medical:  has a past medical history of Adrenal gland anomaly, Anxiety, CAD (coronary artery disease), Carpal tunnel syndrome, Chronic hyponatremia, Colitis, Colonic polyp, COPD (chronic obstructive pulmonary disease) ([place]), Degenerative disc disease, lumbar, Depression, Diverticulosis, Dyspnea, GERD (gastroesophageal reflux disease), H/O degenerative disc disease, Hypercholesterolemia, Hyperkalemia, Hyperlipidemia, Hypertension, Irritable bowel syndrome, Monoclonal gammopathy, Monoclonal gammopathy, Neuropathy, Personal history of tobacco use, presenting hazards to health (08/17/2015), Sleep apnea, and Vertebral compression fracture ([place]). Family: family history includes Heart disease in his father; Stroke in his mother.  Past Surgical History:  Procedure Laterality Date  . BUNIONECTOMY  1989  . CARPAL TUNNEL RELEASE Left 2011   ulnar nerve sub muscular at elbow  . CHOLECYSTECTOMY  09/07  . COLONOSCOPY WITH PROPOFOL N/A 03/05/2017   Procedure: COLONOSCOPY WITH PROPOFOL;  Surgeon: Lucilla Lame, MD;  Location: [facility] ENDOSCOPY;  Service: Endoscopy;  Laterality: N/A;  . ELECTROPHYSIOLOGIC STUDY N/A 11/15/2015   Procedure: CARDIOVERSION;  Surgeon: Isaias Cowman, MD;  Location: [facility] ORS;  Service: Cardiovascular;  Laterality: N/A;  .  ESOPHAGOGASTRODUODENOSCOPY (EGD) WITH PROPOFOL N/A 03/05/2017   Procedure: ESOPHAGOGASTRODUODENOSCOPY (EGD) WITH PROPOFOL;   Surgeon: Lucilla Lame, MD;  Location: [facility] ENDOSCOPY;  Service: Endoscopy;  Laterality: N/A;  . ESOPHAGOGASTRODUODENOSCOPY (EGD) WITH PROPOFOL N/A 10/17/2017   Procedure: ESOPHAGOGASTRODUODENOSCOPY (EGD) WITH PROPOFOL;  Surgeon: Lollie Sails, MD;  Location: [facility] ENDOSCOPY;  Service: Endoscopy;  Laterality: N/A;  . EYE SURGERY Bilateral 2010   cataract  . HEMORRHOID SURGERY    . KYPHOPLASTY N/A 11/04/2016   Procedure: KYPHOPLASTY T 12;  Surgeon: Hessie Knows, MD;  Location: [facility] ORS;  Service: Orthopedics;  Laterality: N/A;   Active Ambulatory Problems    Diagnosis Date Noted  . Iron deficiency anemia 09/08/2012  . 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 CAD (coronary artery disease) 09/08/2012  . Hypertension 09/08/2012  . Hyponatremia 09/08/2012  . GERD (gastroesophageal reflux disease) 09/13/2012  . Sleep apnea 09/13/2012  . Ulcer 02/02/2013  . Chronic back pain 02/02/2013  . SOB (shortness of breath) 11/21/2013  . Leg pain 03/20/2014  . Collagenous colitis 09/16/2014  . Health care maintenance 10/23/2014  . Personal history of tobacco use, presenting hazards to health 08/17/2015  . Sleep difficulties 09/26/2015  . Skin lesion 01/21/2016  . Depression 07/21/2016  . Moderate COPD (chronic obstructive pulmonary disease) ([place]) 07/22/2016  . COPD exacerbation ([place]) 07/22/2016  . History of compression fracture of spine 12/08/2016  . Alcohol abuse 01/30/2017  . Waldenstrom's macroglobulinemia ([place]) 01/31/2017  . Chest pain 03/03/2017  . Blood in stool   . Abnormal feces   . Rectal polyp   . Thoracic aortic aneurysm ([place]) 04/29/2017  . Pulmonary hypertension ([place]) 05/02/2017  . Atrial flutter ([place]) 05/02/2017  . Hyperlipemia 12/22/2013  . Constipation 10/29/2017  . Fall 10/29/2017  . Rib pain on right side 10/29/2017  . Syncope 03/05/2018   Resolved Ambulatory Problems    Diagnosis Date Noted  . Hyperkalemia 09/13/2012  . Diarrhea 05/11/2013   . Headache 05/11/2013  . Lesion of lower eyelid 11/21/2013  . Olecranon bursitis of left elbow 02/22/2015  . Nasal congestion 09/28/2015  . Cough 02/15/2016   Past Medical History:  Diagnosis Date  . Adrenal gland anomaly   . Anxiety   . Carpal tunnel syndrome   . Chronic hyponatremia   . Colitis   . Colonic polyp   . COPD (chronic obstructive pulmonary disease) ([place])   . Degenerative disc disease, lumbar   . Diverticulosis   . Dyspnea   . H/O degenerative disc disease   . Hypercholesterolemia   . Hyperlipidemia   . Irritable bowel syndrome   . Monoclonal gammopathy   . Monoclonal gammopathy   . Neuropathy   . Vertebral compression fracture ([place])    Constitutional Exam  General appearance: Well nourished, well developed, and well hydrated. In no apparent acute distress Vitals:   04/09/18 1015  BP: (!) 184/94  Pulse: (!) 59  Resp: 16  Temp: 98.4 F (36.9 C)  TempSrc: Oral  SpO2: 93%  Weight: 186 lb (84.4 kg)  Height: '5\' 11"'  (1.803 m)   BMI Assessment: Estimated body mass index is 25.94 kg/m as calculated from the following:   Height as of this encounter: '5\' 11"'  (1.803 m).   Weight as of this encounter: 186 lb (84.4 kg).  BMI interpretation table: BMI level Category Range association with higher incidence of chronic pain  <18 kg/m2 Underweight   18.5-24.9 kg/m2 Ideal body weight   25-29.9 kg/m2 Overweight Increased incidence by 20%  30-34.9 kg/m2  Obese (Class I) Increased incidence by 68%  35-39.9 kg/m2 Severe obesity (Class II) Increased incidence by 136%  >40 kg/m2 Extreme obesity (Class III) Increased incidence by 254%   Patient's current BMI Ideal Body weight  Body mass index is 25.94 kg/m. Ideal body weight: 75.3 kg (166 lb 0.1 oz) Adjusted ideal body weight: 78.9 kg (174 lb 0.1 oz)   BMI Readings from Last 4 Encounters:  04/09/18 25.94 kg/m  03/27/18 27.17 kg/m  02/25/18 26.80 kg/m  02/18/18 25.67 kg/m   Wt Readings from Last 4 Encounters:   04/09/18 186 lb (84.4 kg)  03/27/18 189 lb 6 oz (85.9 kg)  02/25/18 186 lb 12.8 oz (84.7 kg)  02/18/18 184 lb 1.4 oz (83.5 kg)  Psych/Mental status: Alert, oriented x 3 (person, place, & time)       Eyes: PERLA Respiratory: No evidence of acute respiratory distress  Cervical Spine Area Exam  Skin & Axial Inspection: No masses, redness, edema, swelling, or associated skin lesions Alignment: Symmetrical Functional ROM: Unrestricted ROM      Stability: No instability detected Muscle Tone/Strength: Functionally intact. No obvious neuro-muscular anomalies detected. Sensory (Neurological): Unimpaired Palpation: No palpable anomalies              Upper Extremity (UE) Exam    Side: Right upper extremity  Side: Left upper extremity  Skin & Extremity Inspection: Skin color, temperature, and hair growth are WNL. No peripheral edema or cyanosis. No masses, redness, swelling, asymmetry, or associated skin lesions. No contractures.  Skin & Extremity Inspection: Skin color, temperature, and hair growth are WNL. No peripheral edema or cyanosis. No masses, redness, swelling, asymmetry, or associated skin lesions. No contractures.  Functional ROM: Unrestricted ROM          Functional ROM: Unrestricted ROM          Muscle Tone/Strength: Functionally intact. No obvious neuro-muscular anomalies detected.  Muscle Tone/Strength: Functionally intact. No obvious neuro-muscular anomalies detected.  Sensory (Neurological): Unimpaired          Sensory (Neurological): Unimpaired          Palpation: No palpable anomalies              Palpation: No palpable anomalies              Provocative Test(s):  Phalen's test: deferred Tinel's test: deferred Apley's scratch test (touch opposite shoulder):  Action 1 (Across chest): deferred Action 2 (Overhead): deferred Action 3 (LB reach): deferred   Provocative Test(s):  Phalen's test: deferred Tinel's test: deferred Apley's scratch test (touch opposite shoulder):   Action 1 (Across chest): deferred Action 2 (Overhead): deferred Action 3 (LB reach): deferred    Thoracic Spine Area Exam  Skin & Axial Inspection: No masses, redness, or swelling Alignment: Symmetrical Functional ROM: Unrestricted ROM Stability: No instability detected Muscle Tone/Strength: Functionally intact. No obvious neuro-muscular anomalies detected. Sensory (Neurological): Unimpaired Muscle strength & Tone: No palpable anomalies  Lumbar Spine Area Exam  Skin & Axial Inspection: No masses, redness, or swelling Alignment: Symmetrical Functional ROM: Decreased ROM affecting primarily the right Stability: No instability detected Muscle Tone/Strength: Functionally intact. No obvious neuro-muscular anomalies detected. Sensory (Neurological): Dermatomal pain pattern affecting the right L1-L2 dermatome Palpation: No palpable anomalies       Provocative Tests: Hyperextension/rotation test: deferred today       Lumbar quadrant test (Kemp's test): (+) on the right for foraminal stenosis Lateral bending test: deferred today       Patrick's Maneuver: deferred today  FABER test: deferred today                   S-I anterior distraction/compression test: deferred today         S-I lateral compression test: deferred today         S-I Thigh-thrust test: deferred today         S-I Gaenslen's test: deferred today          Gait & Posture Assessment  Ambulation: Limited Gait: Antalgic gait (limping) Posture: Difficulty with positional changes   Lower Extremity Exam    Side: Right lower extremity  Side: Left lower extremity  Stability: No instability observed          Stability: No instability observed          Skin & Extremity Inspection: Skin color, temperature, and hair growth are WNL. No peripheral edema or cyanosis. No masses, redness, swelling, asymmetry, or associated skin lesions. No contractures.  Skin & Extremity Inspection: Skin color, temperature, and hair  growth are WNL. No peripheral edema or cyanosis. No masses, redness, swelling, asymmetry, or associated skin lesions. No contractures.  Functional ROM: Decreased ROM for hip joint          Functional ROM: Unrestricted ROM                  Muscle Tone/Strength: Functionally intact. No obvious neuro-muscular anomalies detected.  Muscle Tone/Strength: Functionally intact. No obvious neuro-muscular anomalies detected.  Sensory (Neurological): Unimpaired  Sensory (Neurological): Unimpaired  Palpation: No palpable anomalies  Palpation: No palpable anomalies  5 out of 5 strength bilateral lower extremity: Plantar flexion, dorsiflexion, knee flexion, knee extension.  Assessment  Primary Diagnosis & Pertinent Problem List: The primary encounter diagnosis was Lumbar radiculopathy (Right L1/2). Diagnoses of Right leg pain, Lumbar degenerative disc disease, Lumbar foraminal stenosis, Chronic pain syndrome, and On continuous oral anticoagulation were also pertinent to this visit.  Visit Diagnosis (New problems to examiner): 1. Lumbar radiculopathy (Right L1/2)   2. Right leg pain   3. Lumbar degenerative disc disease   4. Lumbar foraminal stenosis   5. Chronic pain syndrome   6. On continuous oral anticoagulation    General Recommendations: The pain condition that the patient suffers from is best treated with a multidisciplinary approach that involves an increase in physical activity to prevent de-conditioning and worsening of the pain cycle, as well as psychological counseling (formal and/or informal) to address the co-morbid psychological affects of pain. Treatment will often involve judicious use of pain medications and interventional procedures to decrease the pain, allowing the patient to participate in the physical activity that will ultimately produce long-lasting pain reductions. The goal of the multidisciplinary approach is to return the patient to a higher level of overall function and to restore  their ability to perform activities of daily living.  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  Patient is referred here from Dr. Lacinda Axon with neurosurgery for consideration of a right L1-L2 epidural steroid injection.  Patient denies receiving an epidural steroid injection in the past.  Of note he has tried lumbar facet medial branch nerve blocks with radiofrequency ablation for his axial low back pain which was not very effective.  This was done with physical medicine and rehab at [facility].  Patient denies any bowel or bladder dysfunction.  Patient does have a history of  a fall in 2018 which resulted in a compression fracture and is status post T12 kyphoplasty.  Today we reviewed the patient's lumbar MRI and discussed the risks and benefits of lumbar epidural steroid injection.  We will plan for L1/L2 or L2/L3 ESI pending interlaminar windows seen on x-ray on day of procedure.  Patient is on Eliquis for atrial flutter.  We will obtain cardiac clearance to see if the patient can stop this medication 4 days prior to scheduled procedure.  All questions answered and patient would like to proceed.  Plan: -Right L1/L2 ESI secondary to lumbar radiculopathy.  Patient must stop Eliquis 4 days prior to scheduled procedure.   Ordered Lab-work, Procedure(s), Referral(s), & Consult(s): Orders Placed This Encounter  Procedures  . Lumbar Epidural Injection   Interventional management options: Mr. Handler was informed that there is no guarantee that he would be a candidate for interventional therapies. The decision will be based on the results of diagnostic studies, as well as Mr. Trembath's risk profile.  Procedure(s) under consideration:  Lumbar ESI   Provider-requested follow-up: Return in about 3 weeks (around 04/30/2018) for Procedure, Blood  Thinner Protocol.  Future Appointments  Date Time Provider [place]  04/14/2018 11:00 AM Einar Pheasant, MD [facility]  04/16/2018 10:30 AM [facility] None  04/28/2018 10:30 AM Wilhelmina [name], MD [facility] None  04/29/2018 12:00 PM Gillis Santa, MD [facility] None    Primary Care Physician: Einar Pheasant, MD Location: [facility] Note by: Gillis Santa, M.D, Date: 04/09/2018; Time: 2:26 PM  Patient Instructions   RIGHT L1-L2 ESI  Obtain cardiac clearance to stop Eliquis 4 days prior to scheduled procedure.   Moderate Conscious Sedation, Adult Sedation is the use of medicines to promote relaxation and relieve discomfort and anxiety. Moderate conscious sedation is a type of sedation. Under moderate conscious sedation, you are less alert than normal, but you are still able to respond to instructions, touch, or both. Moderate conscious sedation is used during short medical and dental procedures. It is milder than deep sedation, which is a type of sedation under which you cannot be easily woken up. It is also milder than general anesthesia, which is the use of medicines to make you unconscious. Moderate conscious sedation allows you to return to your regular activities sooner. Tell a health care provider about:  Any allergies you have.  All medicines you are taking, including vitamins, herbs, eye drops, creams, and over-the-counter medicines.  Use of steroids (by mouth or creams).  Any problems you or family members have had with sedatives and anesthetic medicines.  Any blood disorders you have.  Any surgeries you have had.  Any medical conditions you have, such as sleep apnea.  Whether you are pregnant or may be pregnant.  Any use of cigarettes, alcohol, marijuana, or street drugs. What are the risks? Generally, this is a safe procedure. However, problems may occur, including:  Getting too much medicine  (oversedation).  Nausea.  Allergic reaction to medicines.  Trouble breathing. If this happens, a breathing tube may be used to help with breathing. It will be removed when you are awake and breathing on your own.  Heart trouble.  Lung trouble.  What happens before the procedure? Staying hydrated Follow instructions from your health care provider about hydration, which may include:  Up to 2 hours before the procedure - you may continue to drink clear liquids, such as water, clear fruit juice, black coffee, and plain tea.  Eating and  drinking restrictions Follow instructions from your health care provider about eating and drinking, which may include:  8 hours before the procedure - stop eating heavy meals or foods such as meat, fried foods, or fatty foods.  6 hours before the procedure - stop eating light meals or foods, such as toast or cereal.  6 hours before the procedure - stop drinking milk or drinks that contain milk.  2 hours before the procedure - stop drinking clear liquids.  Medicine  Ask your health care provider about:  Changing or stopping your regular medicines. This is especially important if you are taking diabetes medicines or blood thinners.  Taking medicines such as aspirin and ibuprofen. These medicines can thin your blood. Do not take these medicines before your procedure if your health care provider instructs you not to.  Tests and exams  You will have a physical exam.  You may have blood tests done to show: ? How well your kidneys and liver are working. ? How well your blood can clot. General instructions  Plan to have someone take you home from the hospital or clinic.  If you will be going home right after the procedure, plan to have someone with you for 24 hours. What happens during the procedure?  An IV tube will be inserted into one of your veins.  Medicine to help you relax (sedative) will be given through the IV tube.  The medical or  dental procedure will be performed. What happens after the procedure?  Your blood pressure, heart rate, breathing rate, and blood oxygen level will be monitored often until the medicines you were given have worn off.  Do not drive for 24 hours. This information is not intended to replace advice given to you by your health care provider. Make sure you discuss any questions you have with your health care provider. Document Released: 04/30/2001 Document Revised: 01/09/2016 Document Reviewed: 11/25/2015 Elsevier Interactive Patient Education  2018 [place]  What are the risk, side effects and possible complications? Generally speaking, most procedures are safe.  However, with any procedure there are risks, side effects, and the possibility of complications.  The risks and complications are dependent upon the sites that are lesioned, or the type of nerve block to be performed.  The closer the procedure is to the spine, the more serious the risks are.  Great care is taken when placing the radio frequency needles, block needles or lesioning probes, but sometimes complications can occur. 1. Infection: Any time there is an injection through the skin, there is a risk of infection.  This is why sterile conditions are used for these blocks.  There are four possible types of infection. 1. Localized skin infection. 2. Central Nervous System Infection-This can be in the form of Meningitis, which can be deadly. 3. Epidural Infections-This can be in the form of an epidural abscess, which can cause pressure inside of the spine, causing compression of the spinal cord with subsequent paralysis. This would require an emergency surgery to decompress, and there are no guarantees that the patient would recover from the paralysis. 4. Discitis-This is an infection of the intervertebral discs.  It occurs in about 1% of discography procedures.  It is difficult to treat and it may lead  to surgery.        2. Pain: the needles have to go through skin and soft tissues, will cause soreness.       3. Damage to internal  structures:  The nerves to be lesioned may be near blood vessels or    other nerves which can be potentially damaged.       4. Bleeding: Bleeding is more common if the patient is taking blood thinners such as  aspirin, Coumadin, Ticiid, Plavix, etc., or if he/she have some genetic predisposition  such as hemophilia. Bleeding into the spinal canal can cause compression of the spinal  cord with subsequent paralysis.  This would require an emergency surgery to  decompress and there are no guarantees that the patient would recover from the  paralysis.       5. Pneumothorax:  Puncturing of a lung is a possibility, every time a needle is introduced in  the area of the chest or upper back.  Pneumothorax refers to free air around the  collapsed lung(s), inside of the thoracic cavity (chest cavity).  Another two possible  complications related to a similar event would include: Hemothorax and Chylothorax.   These are variations of the Pneumothorax, where instead of air around the collapsed  lung(s), you may have blood or chyle, respectively.       6. Spinal headaches: They may occur with any procedures in the area of the spine.       7. Persistent CSF (Cerebro-Spinal Fluid) leakage: This is a rare problem, but may occur  with prolonged intrathecal or epidural catheters either due to the formation of a fistulous  track or a dural tear.       8. Nerve damage: By working so close to the spinal cord, there is always a possibility of  nerve damage, which could be as serious as a permanent spinal cord injury with  paralysis.       9. Death:  Although rare, severe deadly allergic reactions known as "Anaphylactic  reaction" can occur to any of the medications used.      10. Worsening of the symptoms:  We can always make thing worse.  What are the chances of something like this  happening? Chances of any of this occuring are extremely low.  By statistics, you have more of a chance of getting killed in a motor vehicle accident: while driving to the hospital than any of the above occurring .  Nevertheless, you should be aware that they are possibilities.  In general, it is similar to taking a shower.  Everybody knows that you can slip, hit your head and get killed.  Does that mean that you should not shower again?  Nevertheless always keep in mind that statistics do not mean anything if you happen to be on the wrong side of them.  Even if a procedure has a 1 (one) in a 1,000,000 (million) chance of going wrong, it you happen to be that one..Also, keep in mind that by statistics, you have more of a chance of having something go wrong when taking medications.  Who should not have this procedure? If you are on a blood thinning medication (e.g. Coumadin, Plavix, see list of "Blood Thinners"), or if you have an active infection going on, you should not have the procedure.  If you are taking any blood thinners, please inform your physician.  How should I prepare for this procedure?  Do not eat or drink anything at least six hours prior to the procedure.  Bring a driver with you .  It cannot be a taxi.  Come accompanied by an adult that can drive you back, and that is strong enough to  help you if your legs get weak or numb from the local anesthetic.  Take all of your medicines the morning of the procedure with just enough water to swallow them.  If you have diabetes, make sure that you are scheduled to have your procedure done first thing in the morning, whenever possible.  If you have diabetes, take only half of your insulin dose and notify our nurse that you have done so as soon as you arrive at the clinic.  If you are diabetic, but only take blood sugar pills (oral hypoglycemic), then do not take them on the morning of your procedure.  You may take them after you have had the  procedure.  Do not take aspirin or any aspirin-containing medications, at least eleven (11) days prior to the procedure.  They may prolong bleeding.  Wear loose fitting clothing that may be easy to take off and that you would not mind if it got stained with Betadine or blood.  Do not wear any jewelry or perfume  Remove any nail coloring.  It will interfere with some of our monitoring equipment.  NOTE: Remember that this is not meant to be interpreted as a complete list of all possible complications.  Unforeseen problems may occur.  BLOOD THINNERS The following drugs contain aspirin or other products, which can cause increased bleeding during surgery and should not be taken for 2 weeks prior to and 1 week after surgery.  If you should need take something for relief of minor pain, you may take acetaminophen which is found in Tylenol,m Datril, Anacin-3 and Panadol. It is not blood thinner. The products listed below are.  Do not take any of the products listed below in addition to any listed on your instruction sheet.  A.P.C or A.P.C with Codeine Codeine Phosphate Capsules #3 Ibuprofen Ridaura  ABC compound Congesprin Imuran rimadil  Advil Cope Indocin Robaxisal  Alka-Seltzer Effervescent Pain Reliever and Antacid Coricidin or Coricidin-D  Indomethacin Rufen  Alka-Seltzer plus Cold Medicine Cosprin Ketoprofen S-A-C Tablets  Anacin Analgesic Tablets or Capsules Coumadin Korlgesic Salflex  Anacin Extra Strength Analgesic tablets or capsules CP-2 Tablets Lanoril Salicylate  Anaprox Cuprimine Capsules Levenox Salocol  Anexsia-D Dalteparin Magan Salsalate  Anodynos Darvon compound Magnesium Salicylate Sine-off  Ansaid Dasin Capsules Magsal Sodium Salicylate  Anturane Depen Capsules Marnal Soma  APF Arthritis pain formula Dewitt's Pills Measurin Stanback  Argesic Dia-Gesic Meclofenamic Sulfinpyrazone  Arthritis Bayer Timed Release Aspirin Diclofenac Meclomen Sulindac  Arthritis pain formula  Anacin Dicumarol Medipren Supac  Analgesic (Safety coated) Arthralgen Diffunasal Mefanamic Suprofen  Arthritis Strength Bufferin Dihydrocodeine Mepro Compound Suprol  Arthropan liquid Dopirydamole Methcarbomol with Aspirin Synalgos  ASA tablets/Enseals Disalcid Micrainin Tagament  Ascriptin Doan's Midol Talwin  Ascriptin A/D Dolene Mobidin Tanderil  Ascriptin Extra Strength Dolobid Moblgesic Ticlid  Ascriptin with Codeine Doloprin or Doloprin with Codeine Momentum Tolectin  Asperbuf Duoprin Mono-gesic Trendar  Aspergum Duradyne Motrin or Motrin IB Triminicin  Aspirin plain, buffered or enteric coated Durasal Myochrisine Trigesic  Aspirin Suppositories Easprin Nalfon Trillsate  Aspirin with Codeine Ecotrin Regular or Extra Strength Naprosyn Uracel  Atromid-S Efficin Naproxen Ursinus  Auranofin Capsules Elmiron Neocylate Vanquish  Axotal Emagrin Norgesic Verin  Azathioprine Empirin or Empirin with Codeine Normiflo Vitamin E  Azolid Emprazil Nuprin Voltaren  Bayer Aspirin plain, buffered or children's or timed BC Tablets or powders Encaprin Orgaran Warfarin Sodium  Buff-a-Comp Enoxaparin Orudis Zorpin  Buff-a-Comp with Codeine Equegesic Os-Cal-Gesic   Buffaprin Excedrin plain, buffered or Extra Strength Oxalid  Bufferin Arthritis Strength Feldene Oxphenbutazone   Bufferin plain or Extra Strength Feldene Capsules Oxycodone with Aspirin   Bufferin with Codeine Fenoprofen Fenoprofen Pabalate or Pabalate-SF   Buffets II Flogesic Panagesic   Buffinol plain or Extra Strength Florinal or Florinal with Codeine Panwarfarin   Buf-Tabs Flurbiprofen Penicillamine   Butalbital Compound Four-way cold tablets Penicillin   Butazolidin Fragmin Pepto-Bismol   Carbenicillin Geminisyn Percodan   Carna Arthritis Reliever Geopen Persantine   Carprofen Gold's salt Persistin   Chloramphenicol Goody's Phenylbutazone   Chloromycetin Haltrain Piroxlcam   Clmetidine heparin Plaquenil   Cllnoril Hyco-pap  Ponstel   Clofibrate Hydroxy chloroquine Propoxyphen         Before stopping any of these medications, be sure to consult the physician who ordered them.  Some, such as Coumadin (Warfarin) are ordered to prevent or treat serious conditions such as "deep thrombosis", "pumonary embolisms", and other heart problems.  The amount of time that you may need off of the medication may also vary with the medication and the reason for which you were taking it.  If you are taking any of these medications, please make sure you notify your pain physician before you undergo any procedures.         Epidural Steroid Injection Patient Information  Description: The epidural space surrounds the nerves as they exit the spinal cord.  In some patients, the nerves can be compressed and inflamed by a bulging disc or a tight spinal canal (spinal stenosis).  By injecting steroids into the epidural space, we can bring irritated nerves into direct contact with a potentially helpful medication.  These steroids act directly on the irritated nerves and can reduce swelling and inflammation which often leads to decreased pain.  Epidural steroids may be injected anywhere along the spine and from the neck to the low back depending upon the location of your pain.   After numbing the skin with local anesthetic (like Novocaine), a small needle is passed into the epidural space slowly.  You may experience a sensation of pressure while this is being done.  The entire block usually last less than 10 minutes.  Conditions which may be treated by epidural steroids:   Low back and leg pain  Neck and arm pain  Spinal stenosis  Post-laminectomy syndrome  Herpes zoster (shingles) pain  Pain from compression fractures  Preparation for the injection:  1. Do not eat any solid food or dairy products within 8 hours of your appointment.  2. You may drink clear liquids up to 3 hours before appointment.  Clear liquids include water,  black coffee, juice or soda.  No milk or cream please. 3. You may take your regular medication, including pain medications, with a sip of water before your appointment  Diabetics should hold regular insulin (if taken separately) and take 1/2 normal NPH dos the morning of the procedure.  Carry some sugar containing items with you to your appointment. 4. A driver must accompany you and be prepared to drive you home after your procedure.  5. Bring all your current medications with your. 6. An IV may be inserted and sedation may be given at the discretion of the physician.   7. A blood pressure cuff, EKG and other monitors will often be applied during the procedure.  Some patients may need to have extra oxygen administered for a short period. 8. You will be asked to provide medical information, including your allergies, prior to the procedure.  We must know immediately if  you are taking blood thinners (like Coumadin/Warfarin)  Or if you are allergic to IV iodine contrast (dye). We must know if you could possible be pregnant.  Possible side-effects:  Bleeding from needle site  Infection (rare, may require surgery)  Nerve injury (rare)  Numbness & tingling (temporary)  Difficulty urinating (rare, temporary)  Spinal headache ( a headache worse with upright posture)  Light -headedness (temporary)  Pain at injection site (several days)  Decreased blood pressure (temporary)  Weakness in arm/leg (temporary)  Pressure sensation in back/neck (temporary)  Call if you experience:  Fever/chills associated with headache or increased back/neck pain.  Headache worsened by an upright position.  New onset weakness or numbness of an extremity below the injection site  Hives or difficulty breathing (go to the emergency room)  Inflammation or drainage at the infection site  Severe back/neck pain  Any new symptoms which are concerning to you  Please note:  Although the local anesthetic  injected can often make your back or neck feel good for several hours after the injection, the pain will likely return.  It takes 3-7 days for steroids to work in the epidural space.  You may not notice any pain relief for at least that one week.  If effective, we will often do a series of three injections spaced 3-6 weeks apart to maximally decrease your pain.  After the initial series, we generally will wait several months before considering a repeat injection of the same type.  If you have any questions, please call [phone] [facility]

## 2018-04-09 NOTE — Patient Instructions (Addendum)
RIGHT L1-L2 ESI  Obtain cardiac clearance to stop Eliquis 4 days prior to scheduled procedure.   Moderate Conscious Sedation, Adult Sedation is the use of medicines to promote relaxation and relieve discomfort and anxiety. Moderate conscious sedation is a type of sedation. Under moderate conscious sedation, you are less alert than normal, but you are still able to respond to instructions, touch, or both. Moderate conscious sedation is used during short medical and dental procedures. It is milder than deep sedation, which is a type of sedation under which you cannot be easily woken up. It is also milder than general anesthesia, which is the use of medicines to make you unconscious. Moderate conscious sedation allows you to return to your regular activities sooner. Tell a health care provider about:  Any allergies you have.  All medicines you are taking, including vitamins, herbs, eye drops, creams, and over-the-counter medicines.  Use of steroids (by mouth or creams).  Any problems you or family members have had with sedatives and anesthetic medicines.  Any blood disorders you have.  Any surgeries you have had.  Any medical conditions you have, such as sleep apnea.  Whether you are pregnant or may be pregnant.  Any use of cigarettes, alcohol, marijuana, or street drugs. What are the risks? Generally, this is a safe procedure. However, problems may occur, including:  Getting too much medicine (oversedation).  Nausea.  Allergic reaction to medicines.  Trouble breathing. If this happens, a breathing tube may be used to help with breathing. It will be removed when you are awake and breathing on your own.  Heart trouble.  Lung trouble.  What happens before the procedure? Staying hydrated Follow instructions from your health care provider about hydration, which may include:  Up to 2 hours before the procedure - you may continue to drink clear liquids, such as water, clear fruit  juice, black coffee, and plain tea.  Eating and drinking restrictions Follow instructions from your health care provider about eating and drinking, which may include:  8 hours before the procedure - stop eating heavy meals or foods such as meat, fried foods, or fatty foods.  6 hours before the procedure - stop eating light meals or foods, such as toast or cereal.  6 hours before the procedure - stop drinking milk or drinks that contain milk.  2 hours before the procedure - stop drinking clear liquids.  Medicine  Ask your health care provider about:  Changing or stopping your regular medicines. This is especially important if you are taking diabetes medicines or blood thinners.  Taking medicines such as aspirin and ibuprofen. These medicines can thin your blood. Do not take these medicines before your procedure if your health care provider instructs you not to.  Tests and exams  You will have a physical exam.  You may have blood tests done to show: ? How well your kidneys and liver are working. ? How well your blood can clot. General instructions  Plan to have someone take you home from the hospital or clinic.  If you will be going home right after the procedure, plan to have someone with you for 24 hours. What happens during the procedure?  An IV tube will be inserted into one of your veins.  Medicine to help you relax (sedative) will be given through the IV tube.  The medical or dental procedure will be performed. What happens after the procedure?  Your blood pressure, heart rate, breathing rate, and blood oxygen level will be  monitored often until the medicines you were given have worn off.  Do not drive for 24 hours. This information is not intended to replace advice given to you by your health care provider. Make sure you discuss any questions you have with your health care provider. Document Released: 04/30/2001 Document Revised: 01/09/2016 Document Reviewed:  11/25/2015 Elsevier Interactive Patient Education  2018 [place]  What are the risk, side effects and possible complications? Generally speaking, most procedures are safe.  However, with any procedure there are risks, side effects, and the possibility of complications.  The risks and complications are dependent upon the sites that are lesioned, or the type of nerve block to be performed.  The closer the procedure is to the spine, the more serious the risks are.  Great care is taken when placing the radio frequency needles, block needles or lesioning probes, but sometimes complications can occur. 1. Infection: Any time there is an injection through the skin, there is a risk of infection.  This is why sterile conditions are used for these blocks.  There are four possible types of infection. 1. Localized skin infection. 2. Central Nervous System Infection-This can be in the form of Meningitis, which can be deadly. 3. Epidural Infections-This can be in the form of an epidural abscess, which can cause pressure inside of the spine, causing compression of the spinal cord with subsequent paralysis. This would require an emergency surgery to decompress, and there are no guarantees that the patient would recover from the paralysis. 4. Discitis-This is an infection of the intervertebral discs.  It occurs in about 1% of discography procedures.  It is difficult to treat and it may lead to surgery.        2. Pain: the needles have to go through skin and soft tissues, will cause soreness.       3. Damage to internal structures:  The nerves to be lesioned may be near blood vessels or    other nerves which can be potentially damaged.       4. Bleeding: Bleeding is more common if the patient is taking blood thinners such as  aspirin, Coumadin, Ticiid, Plavix, etc., or if he/she have some genetic predisposition  such as hemophilia. Bleeding into the spinal canal can cause  compression of the spinal  cord with subsequent paralysis.  This would require an emergency surgery to  decompress and there are no guarantees that the patient would recover from the  paralysis.       5. Pneumothorax:  Puncturing of a lung is a possibility, every time a needle is introduced in  the area of the chest or upper back.  Pneumothorax refers to free air around the  collapsed lung(s), inside of the thoracic cavity (chest cavity).  Another two possible  complications related to a similar event would include: Hemothorax and Chylothorax.   These are variations of the Pneumothorax, where instead of air around the collapsed  lung(s), you may have blood or chyle, respectively.       6. Spinal headaches: They may occur with any procedures in the area of the spine.       7. Persistent CSF (Cerebro-Spinal Fluid) leakage: This is a rare problem, but may occur  with prolonged intrathecal or epidural catheters either due to the formation of a fistulous  track or a dural tear.       8. Nerve damage: By working so close to the spinal cord, there  is always a possibility of  nerve damage, which could be as serious as a permanent spinal cord injury with  paralysis.       9. Death:  Although rare, severe deadly allergic reactions known as "Anaphylactic  reaction" can occur to any of the medications used.      10. Worsening of the symptoms:  We can always make thing worse.  What are the chances of something like this happening? Chances of any of this occuring are extremely low.  By statistics, you have more of a chance of getting killed in a motor vehicle accident: while driving to the hospital than any of the above occurring .  Nevertheless, you should be aware that they are possibilities.  In general, it is similar to taking a shower.  Everybody knows that you can slip, hit your head and get killed.  Does that mean that you should not shower again?  Nevertheless always keep in mind that statistics do not mean  anything if you happen to be on the wrong side of them.  Even if a procedure has a 1 (one) in a 1,000,000 (million) chance of going wrong, it you happen to be that one..Also, keep in mind that by statistics, you have more of a chance of having something go wrong when taking medications.  Who should not have this procedure? If you are on a blood thinning medication (e.g. Coumadin, Plavix, see list of "Blood Thinners"), or if you have an active infection going on, you should not have the procedure.  If you are taking any blood thinners, please inform your physician.  How should I prepare for this procedure?  Do not eat or drink anything at least six hours prior to the procedure.  Bring a driver with you .  It cannot be a taxi.  Come accompanied by an adult that can drive you back, and that is strong enough to help you if your legs get weak or numb from the local anesthetic.  Take all of your medicines the morning of the procedure with just enough water to swallow them.  If you have diabetes, make sure that you are scheduled to have your procedure done first thing in the morning, whenever possible.  If you have diabetes, take only half of your insulin dose and notify our nurse that you have done so as soon as you arrive at the clinic.  If you are diabetic, but only take blood sugar pills (oral hypoglycemic), then do not take them on the morning of your procedure.  You may take them after you have had the procedure.  Do not take aspirin or any aspirin-containing medications, at least eleven (11) days prior to the procedure.  They may prolong bleeding.  Wear loose fitting clothing that may be easy to take off and that you would not mind if it got stained with Betadine or blood.  Do not wear any jewelry or perfume  Remove any nail coloring.  It will interfere with some of our monitoring equipment.  NOTE: Remember that this is not meant to be interpreted as a complete list of all possible  complications.  Unforeseen problems may occur.  BLOOD THINNERS The following drugs contain aspirin or other products, which can cause increased bleeding during surgery and should not be taken for 2 weeks prior to and 1 week after surgery.  If you should need take something for relief of minor pain, you may take acetaminophen which is found in Tylenol,m Datril, Anacin-3  and Panadol. It is not blood thinner. The products listed below are.  Do not take any of the products listed below in addition to any listed on your instruction sheet.  A.P.C or A.P.C with Codeine Codeine Phosphate Capsules #3 Ibuprofen Ridaura  ABC compound Congesprin Imuran rimadil  Advil Cope Indocin Robaxisal  Alka-Seltzer Effervescent Pain Reliever and Antacid Coricidin or Coricidin-D  Indomethacin Rufen  Alka-Seltzer plus Cold Medicine Cosprin Ketoprofen S-A-C Tablets  Anacin Analgesic Tablets or Capsules Coumadin Korlgesic Salflex  Anacin Extra Strength Analgesic tablets or capsules CP-2 Tablets Lanoril Salicylate  Anaprox Cuprimine Capsules Levenox Salocol  Anexsia-D Dalteparin Magan Salsalate  Anodynos Darvon compound Magnesium Salicylate Sine-off  Ansaid Dasin Capsules Magsal Sodium Salicylate  Anturane Depen Capsules Marnal Soma  APF Arthritis pain formula Dewitt's Pills Measurin Stanback  Argesic Dia-Gesic Meclofenamic Sulfinpyrazone  Arthritis Bayer Timed Release Aspirin Diclofenac Meclomen Sulindac  Arthritis pain formula Anacin Dicumarol Medipren Supac  Analgesic (Safety coated) Arthralgen Diffunasal Mefanamic Suprofen  Arthritis Strength Bufferin Dihydrocodeine Mepro Compound Suprol  Arthropan liquid Dopirydamole Methcarbomol with Aspirin Synalgos  ASA tablets/Enseals Disalcid Micrainin Tagament  Ascriptin Doan's Midol Talwin  Ascriptin A/D Dolene Mobidin Tanderil  Ascriptin Extra Strength Dolobid Moblgesic Ticlid  Ascriptin with Codeine Doloprin or Doloprin with Codeine Momentum Tolectin  Asperbuf Duoprin  Mono-gesic Trendar  Aspergum Duradyne Motrin or Motrin IB Triminicin  Aspirin plain, buffered or enteric coated Durasal Myochrisine Trigesic  Aspirin Suppositories Easprin Nalfon Trillsate  Aspirin with Codeine Ecotrin Regular or Extra Strength Naprosyn Uracel  Atromid-S Efficin Naproxen Ursinus  Auranofin Capsules Elmiron Neocylate Vanquish  Axotal Emagrin Norgesic Verin  Azathioprine Empirin or Empirin with Codeine Normiflo Vitamin E  Azolid Emprazil Nuprin Voltaren  Bayer Aspirin plain, buffered or children's or timed BC Tablets or powders Encaprin Orgaran Warfarin Sodium  Buff-a-Comp Enoxaparin Orudis Zorpin  Buff-a-Comp with Codeine Equegesic Os-Cal-Gesic   Buffaprin Excedrin plain, buffered or Extra Strength Oxalid   Bufferin Arthritis Strength Feldene Oxphenbutazone   Bufferin plain or Extra Strength Feldene Capsules Oxycodone with Aspirin   Bufferin with Codeine Fenoprofen Fenoprofen Pabalate or Pabalate-SF   Buffets II Flogesic Panagesic   Buffinol plain or Extra Strength Florinal or Florinal with Codeine Panwarfarin   Buf-Tabs Flurbiprofen Penicillamine   Butalbital Compound Four-way cold tablets Penicillin   Butazolidin Fragmin Pepto-Bismol   Carbenicillin Geminisyn Percodan   Carna Arthritis Reliever Geopen Persantine   Carprofen Gold's salt Persistin   Chloramphenicol Goody's Phenylbutazone   Chloromycetin Haltrain Piroxlcam   Clmetidine heparin Plaquenil   Cllnoril Hyco-pap Ponstel   Clofibrate Hydroxy chloroquine Propoxyphen         Before stopping any of these medications, be sure to consult the physician who ordered them.  Some, such as Coumadin (Warfarin) are ordered to prevent or treat serious conditions such as "deep thrombosis", "pumonary embolisms", and other heart problems.  The amount of time that you may need off of the medication may also vary with the medication and the reason for which you were taking it.  If you are taking any of these medications, please  make sure you notify your pain physician before you undergo any procedures.         Epidural Steroid Injection Patient Information  Description: The epidural space surrounds the nerves as they exit the spinal cord.  In some patients, the nerves can be compressed and inflamed by a bulging disc or a tight spinal canal (spinal stenosis).  By injecting steroids into the epidural space, we can bring irritated nerves into direct  contact with a potentially helpful medication.  These steroids act directly on the irritated nerves and can reduce swelling and inflammation which often leads to decreased pain.  Epidural steroids may be injected anywhere along the spine and from the neck to the low back depending upon the location of your pain.   After numbing the skin with local anesthetic (like Novocaine), a small needle is passed into the epidural space slowly.  You may experience a sensation of pressure while this is being done.  The entire block usually last less than 10 minutes.  Conditions which may be treated by epidural steroids:   Low back and leg pain  Neck and arm pain  Spinal stenosis  Post-laminectomy syndrome  Herpes zoster (shingles) pain  Pain from compression fractures  Preparation for the injection:  1. Do not eat any solid food or dairy products within 8 hours of your appointment.  2. You may drink clear liquids up to 3 hours before appointment.  Clear liquids include water, black coffee, juice or soda.  No milk or cream please. 3. You may take your regular medication, including pain medications, with a sip of water before your appointment  Diabetics should hold regular insulin (if taken separately) and take 1/2 normal NPH dos the morning of the procedure.  Carry some sugar containing items with you to your appointment. 4. A driver must accompany you and be prepared to drive you home after your procedure.  5. Bring all your current medications with your. 6. An IV may be  inserted and sedation may be given at the discretion of the physician.   7. A blood pressure cuff, EKG and other monitors will often be applied during the procedure.  Some patients may need to have extra oxygen administered for a short period. 8. You will be asked to provide medical information, including your allergies, prior to the procedure.  We must know immediately if you are taking blood thinners (like Coumadin/Warfarin)  Or if you are allergic to IV iodine contrast (dye). We must know if you could possible be pregnant.  Possible side-effects:  Bleeding from needle site  Infection (rare, may require surgery)  Nerve injury (rare)  Numbness & tingling (temporary)  Difficulty urinating (rare, temporary)  Spinal headache ( a headache worse with upright posture)  Light -headedness (temporary)  Pain at injection site (several days)  Decreased blood pressure (temporary)  Weakness in arm/leg (temporary)  Pressure sensation in back/neck (temporary)  Call if you experience:  Fever/chills associated with headache or increased back/neck pain.  Headache worsened by an upright position.  New onset weakness or numbness of an extremity below the injection site  Hives or difficulty breathing (go to the emergency room)  Inflammation or drainage at the infection site  Severe back/neck pain  Any new symptoms which are concerning to you  Please note:  Although the local anesthetic injected can often make your back or neck feel good for several hours after the injection, the pain will likely return.  It takes 3-7 days for steroids to work in the epidural space.  You may not notice any pain relief for at least that one week.  If effective, we will often do a series of three injections spaced 3-6 weeks apart to maximally decrease your pain.  After the initial series, we generally will wait several months before considering a repeat injection of the same type.  If you have any  questions, please call [phone] [facility]

## 2018-04-14 ENCOUNTER — Encounter: Payer: Self-pay | Admitting: Internal Medicine

## 2018-04-14 ENCOUNTER — Ambulatory Visit (INDEPENDENT_AMBULATORY_CARE_PROVIDER_SITE_OTHER): Payer: PPO | Admitting: Internal Medicine

## 2018-04-14 DIAGNOSIS — F329 Major depressive disorder, single episode, unspecified: Secondary | ICD-10-CM | POA: Diagnosis not present

## 2018-04-14 DIAGNOSIS — I272 Pulmonary hypertension, unspecified: Secondary | ICD-10-CM

## 2018-04-14 DIAGNOSIS — F32A Depression, unspecified: Secondary | ICD-10-CM

## 2018-04-14 DIAGNOSIS — I2510 Atherosclerotic heart disease of native coronary artery without angina pectoris: Secondary | ICD-10-CM

## 2018-04-14 DIAGNOSIS — I10 Essential (primary) hypertension: Secondary | ICD-10-CM

## 2018-04-14 DIAGNOSIS — I4892 Unspecified atrial flutter: Secondary | ICD-10-CM

## 2018-04-14 DIAGNOSIS — K219 Gastro-esophageal reflux disease without esophagitis: Secondary | ICD-10-CM | POA: Diagnosis not present

## 2018-04-14 DIAGNOSIS — E871 Hypo-osmolality and hyponatremia: Secondary | ICD-10-CM | POA: Diagnosis not present

## 2018-04-14 DIAGNOSIS — K52831 Collagenous colitis: Secondary | ICD-10-CM | POA: Diagnosis not present

## 2018-04-14 DIAGNOSIS — M549 Dorsalgia, unspecified: Secondary | ICD-10-CM

## 2018-04-14 DIAGNOSIS — J449 Chronic obstructive pulmonary disease, unspecified: Secondary | ICD-10-CM

## 2018-04-14 DIAGNOSIS — D472 Monoclonal gammopathy: Secondary | ICD-10-CM

## 2018-04-14 DIAGNOSIS — D509 Iron deficiency anemia, unspecified: Secondary | ICD-10-CM | POA: Diagnosis not present

## 2018-04-14 DIAGNOSIS — G8929 Other chronic pain: Secondary | ICD-10-CM

## 2018-04-14 MED ORDER — AZELASTINE HCL 0.1 % NA SOLN: 1.0000 | Freq: Two times a day (BID) | NASAL | 1 refills | Status: DC

## 2018-04-14 NOTE — Patient Instructions (Signed)
Increase losartan to 100mg per day 

## 2018-04-14 NOTE — Progress Notes (Signed)
Patient ID: Troy Lopez, male   DOB: 06/05/1941, 77 y.o.   MRN: [id number]   Subjective:    Patient ID: Troy Lopez, male    DOB: [birth year], 77 y.o.   MRN: [id number]  HPI  Patient here for a scheduled follow up.  Still with increased right leg pain.  Has been a persistent issues.  Has seen neurosurgery.  Evaluated by pain clinic 04/09/18.  Planning for ESI.  Some right knee pain - started two weeks ago.  Sees cardiology.  Overall stable from cardiac standpoint.  Blood pressure has been elevated.  He started back on amlodipine.  Taking metoprolol 1/2 bid.  Also on 1/2 losartan 100mg .  Feels blood pressure still elevated.  Wants to go back on full dose losartan and stop amlodipine and follow pressures.  No sob.  No acid reflux.  No abdominal pain. Bowels vary.  No watery diarrhea now.  Sees Dr Grayland Ormond.  Being followed for monoclonal gammopathy.     Past Medical History:  Diagnosis Date  . Adrenal gland anomaly    enlargement  . Anxiety   . CAD (coronary artery disease)   . Carpal tunnel syndrome   . Chronic hyponatremia   . Colitis   . Colonic polyp   . COPD (chronic obstructive pulmonary disease) ([place])   . Degenerative disc disease, lumbar   . Depression   . Diverticulosis   . Dyspnea   . GERD (gastroesophageal reflux disease)   . H/O degenerative disc disease   . Hypercholesterolemia   . Hyperkalemia   . Hyperlipidemia   . Hypertension   . Irritable bowel syndrome   . Monoclonal gammopathy   . Monoclonal gammopathy   . Neuropathy   . Personal history of tobacco use, presenting hazards to health 08/17/2015  . Sleep apnea   . Vertebral compression fracture [facility])    Past Surgical History:  Procedure Laterality Date  . BUNIONECTOMY  1989  . CARPAL TUNNEL RELEASE Left 2011   ulnar nerve sub muscular at elbow  . CHOLECYSTECTOMY  09/07  . COLONOSCOPY WITH PROPOFOL N/A 03/05/2017   Procedure: COLONOSCOPY WITH PROPOFOL;  Surgeon: Lucilla Lame, MD;  Location: [facility]  ENDOSCOPY;  Service: Endoscopy;  Laterality: N/A;  . ELECTROPHYSIOLOGIC STUDY N/A 11/15/2015   Procedure: CARDIOVERSION;  Surgeon: Isaias Cowman, MD;  Location: [facility] ORS;  Service: Cardiovascular;  Laterality: N/A;  . ESOPHAGOGASTRODUODENOSCOPY (EGD) WITH PROPOFOL N/A 03/05/2017   Procedure: ESOPHAGOGASTRODUODENOSCOPY (EGD) WITH PROPOFOL;  Surgeon: Lucilla Lame, MD;  Location: [facility] ENDOSCOPY;  Service: Endoscopy;  Laterality: N/A;  . ESOPHAGOGASTRODUODENOSCOPY (EGD) WITH PROPOFOL N/A 10/17/2017   Procedure: ESOPHAGOGASTRODUODENOSCOPY (EGD) WITH PROPOFOL;  Surgeon: Lollie Sails, MD;  Location: [facility] ENDOSCOPY;  Service: Endoscopy;  Laterality: N/A;  . EYE SURGERY Bilateral 2010   cataract  . HEMORRHOID SURGERY    . KYPHOPLASTY N/A 11/04/2016   Procedure: KYPHOPLASTY T 12;  Surgeon: Hessie Knows, MD;  Location: [facility] ORS;  Service: Orthopedics;  Laterality: N/A;   Family History  Problem Relation Age of Onset  . Stroke Mother   . Heart disease Father        MI - 55   . Colon cancer Neg Hx   . Prostate cancer Neg Hx    Social History   Socioeconomic History  . Marital status: Married    Spouse name: Not on file  . Number of children: 3  . Years of education: Not on file  . Highest education level: Not on file  Occupational History  .  Not on file  Social Needs  . Financial resource strain: Not on file  . Food insecurity:    Worry: Not on file    Inability: Not on file  . Transportation needs:    Medical: Not on file    Non-medical: Not on file  Tobacco Use  . Smoking status: Former Smoker    Types: Cigarettes    Last attempt to quit: 11/01/2015    Years since quitting: 2.4  . Smokeless tobacco: Never Used  . Tobacco comment: about 2 cigarettes per day, trying to quit  Substance and Sexual Activity  . Alcohol use: Yes    Alcohol/week: 42.0 standard drinks    Types: 42 Cans of beer per week    Comment: occas  . Drug use: No  . Sexual activity: Not on file  Lifestyle   . Physical activity:    Days per week: Not on file    Minutes per session: Not on file  . Stress: Not on file  Relationships  . Social connections:    Talks on phone: Not on file    Gets together: Not on file    Attends religious service: Not on file    Active member of club or organization: Not on file    Attends meetings of clubs or organizations: Not on file    Relationship status: Not on file  Other Topics Concern  . Not on file  Social History Narrative  . Not on file    Outpatient Encounter Medications as of 04/14/2018  Medication Sig  . acetaminophen (TYLENOL) 500 MG tablet Take 500-1,000 mg by mouth 3 (three) times daily as needed for moderate pain or headache.  . albuterol (PROVENTIL HFA;VENTOLIN HFA) 108 (90 Base) MCG/ACT inhaler Inhale 2 puffs into the lungs every 6 (six) hours as needed for wheezing or shortness of breath.  Marland Kitchen alendronate (FOSAMAX) 70 MG tablet Take 70 mg by mouth once a week. Take with a full glass of water on an empty stomach. Takes on Sundays  . budesonide (ENTOCORT EC) 3 MG 24 hr capsule Take 9 mg by mouth daily as needed (colitis flare).   Marland Kitchen docusate sodium (COLACE) 100 MG capsule   . ELIQUIS 5 MG TABS tablet Take 5 mg by mouth 2 (two) times daily.   Marland Kitchen FIBER PO Take 2 capsules by mouth daily.  . fluticasone (FLONASE) 50 MCG/ACT nasal spray Place 2 sprays into both nostrils daily.  . fluticasone furoate-vilanterol (BREO ELLIPTA) 100-25 MCG/INH AEPB Inhale 1 puff into the lungs daily.  . furosemide (LASIX) 20 MG tablet Take 1 tablet (20 mg total) by mouth daily as needed.  . gabapentin (NEURONTIN) 100 MG capsule Take 4 capsules (400 mg) daily in the morning and midday. Take 8 capsules (800 mg) daily in the evening.  . isosorbide mononitrate (IMDUR) 30 MG 24 hr tablet Take 1 tablet (30 mg total) by mouth once daily.  Marland Kitchen losartan (COZAAR) 100 MG tablet Take 1 tablet (100 mg total) by mouth daily.  . metoprolol tartrate (LOPRESSOR) 100 MG tablet Take 1  tablet (100 mg total) by mouth 2 (two) times daily.  . pantoprazole (PROTONIX) 40 MG tablet Take 1 tablet (40 mg total) by mouth 2 (two) times daily.  . promethazine (PHENERGAN) 12.5 MG tablet Take 12.5 mg by mouth every 6 (six) hours as needed for nausea or vomiting.  . sertraline (ZOLOFT) 50 MG tablet Take 1 tablet (50 mg total) by mouth daily.  . Tiotropium Bromide Monohydrate (SPIRIVA  RESPIMAT) 1.25 MCG/ACT AERS Inhale 2.5 mcg daily into the lungs.  . vitamin B-12 (CYANOCOBALAMIN) 1000 MCG tablet Take 1,000 mcg by mouth daily.   Marland Kitchen azelastine (ASTELIN) 0.1 % nasal spray Place 1 spray into both nostrils 2 (two) times daily. Use in each nostril as directed  . isosorbide mononitrate (IMDUR) 30 MG 24 hr tablet Take 30 mg by mouth daily.    No facility-administered encounter medications on file as of 04/14/2018.     Review of Systems  Constitutional: Negative for appetite change and unexpected weight change.  HENT: Negative for congestion and sinus pressure.   Respiratory: Negative for cough, chest tightness and shortness of breath.   Cardiovascular: Negative for chest pain, palpitations and leg swelling.  Gastrointestinal: Negative for abdominal pain and nausea.  Genitourinary: Negative for difficulty urinating and dysuria.  Musculoskeletal: Negative for myalgias.       Right knee and leg pain as outlined.  Chronic.    Skin: Negative for color change and rash.  Neurological: Negative for dizziness, light-headedness and headaches.  Psychiatric/Behavioral: Negative for agitation and dysphoric mood.       Objective:    Physical Exam  Constitutional: He appears well-developed and well-nourished. No distress.  HENT:  Nose: Nose normal.  Mouth/Throat: Oropharynx is clear and moist.  Neck: Neck supple.  Cardiovascular: Normal rate and regular rhythm.  Pulmonary/Chest: Effort normal and breath sounds normal. No respiratory distress.  Abdominal: Soft. Bowel sounds are normal. There is no  tenderness.  Musculoskeletal: He exhibits no edema or tenderness.  Lymphadenopathy:    He has no cervical adenopathy.  Skin: No rash noted. No erythema.  Psychiatric: He has a normal mood and affect. His behavior is normal.    BP (!) 150/80 (BP Location: Left Arm, Patient Position: Sitting, Cuff Size: Normal)   Pulse 60   Temp 97.8 F (36.6 C) (Oral)   Resp 18   Wt 187 lb 3.2 oz (84.9 kg)   SpO2 97%   BMI 26.11 kg/m  Wt Readings from Last 3 Encounters:  04/14/18 187 lb 3.2 oz (84.9 kg)  04/09/18 186 lb (84.4 kg)  03/27/18 189 lb 6 oz (85.9 kg)     Lab Results  Component Value Date   WBC 5.5 03/25/2018   HGB 11.4 (L) 03/25/2018   HCT 33.2 (L) 03/25/2018   PLT 386 03/25/2018   GLUCOSE 102 (H) 02/18/2018   CHOL 148 12/17/2017   TRIG 66.0 12/17/2017   HDL 73.90 12/17/2017   LDLCALC 61 12/17/2017   ALT 11 02/18/2018   AST 21 02/18/2018   NA 129 (L) 02/18/2018   K 4.5 02/18/2018   CL 100 02/18/2018   CREATININE 1.18 02/18/2018   BUN 18 02/18/2018   CO2 21 (L) 02/18/2018   TSH 1.75 08/18/2017   PSA 1.79 12/17/2017   INR 1.11 01/08/2017   HGBA1C 5.9 12/17/2017   MICROALBUR 1.9 10/27/2017    Mr Lumbar Spine Wo Contrast  Result Date: 03/09/2018 CLINICAL DATA:  Low back and RIGHT hip pain with numbness for greater than 6 weeks. History of monoclonal gammopathy. EXAM: MRI LUMBAR SPINE WITHOUT CONTRAST TECHNIQUE: Multiplanar, multisequence MR imaging of the lumbar spine was performed. No intravenous contrast was administered. COMPARISON:  MRI lumbar spine October 23, 2016 FINDINGS: SEGMENTATION: For the purposes of this report, the last well-formed intervertebral disc is reported as L5-S1. ALIGNMENT: Maintained lumbar lordosis. Grade 1 L4-5 anterolisthesis without spondylolysis. Subacute bone marrow changes L4-5 facets. Grade 1 L1-2 and L2-3 retrolisthesis. VERTEBRAE:Low  signal within T12 moderate old burst fracture consistent with interval cement augmentation. Diffusely low bone  marrow signal again noted. Moderate acute on chronic discogenic endplate changes D7-8, E4-2. Partially imaged suspected T11 compression fracture. CONUS MEDULLARIS AND CAUDA EQUINA: Conus medullaris terminates at T12-L1 and demonstrates normal morphology and signal characteristics. Cauda equina is normal. PARASPINAL AND OTHER SOFT TISSUES: Nonacute. Mild symmetric paraspinal muscle atrophy. DISC LEVELS: T11-12 (no axial images): Retropulsed bony fragments resulting in mild canal stenosis. No neural foraminal narrowing. T12-L1: Small broad-based disc bulge. No canal stenosis or neural foraminal narrowing. L1-2: Retrolisthesis. Moderate disc osteophyte complex. Mild facet arthropathy and ligamentum flavum redundancy. No canal stenosis. Moderate RIGHT, mild-to-moderate LEFT neural foraminal narrowing. L2-3: Retrolisthesis. Small broad-based disc osteophyte complex. Mild facet arthropathy and ligamentum flavum redundancy. Mild canal stenosis. Mild-to-moderate bilateral neural foraminal narrowing. L3-4: Small broad-based disc bulge. Mild-to-moderate facet arthropathy and ligamentum flavum redundancy. Mild canal stenosis. Mild-to-moderate RIGHT, moderate to severe LEFT neural foraminal narrowing is similar to worse. L4-5: Anterolisthesis. Unroofing of the disc with endplate spurring. Severe facet arthropathy. Moderate canal stenosis has increased. Mild-to-moderate RIGHT, moderate LEFT neural foraminal narrowing. L5-S1: Moderate broad-based disc osteophyte complex asymmetric to the LEFT. No canal stenosis though there is encroachment upon the traversing S1 nerves within the lateral recess. Mild LEFT neural foraminal narrowing. IMPRESSION: 1. T11 suspected fracture, incompletely imaged. Old T12 moderate fracture, status post cement augmentation. 2. Generally decreased T1 bone marrow signal as previously noted seen with myeloproliferative disease and attributable to patient's history of monoclonal gammopathy. 3. Stable grade  1 L4-5 anterolisthesis spondylolysis. Grade 1 L1-2 and L2-3 retrolisthesis. 4. Progressed degenerative change of the lumbar spine. Moderate canal stenosis L4-5. Mild canal stenosis T11-12, L2-3 and L3-4. 5. Neural foraminal narrowing at all lumbar levels: Moderate to severe on the LEFT at L3-4. Electronically Signed   By: Elon Alas M.D.   On: 03/09/2018 13:58       Assessment & Plan:   Problem List Items Addressed This Visit    Atrial flutter [facility])    S/p cardioversion.  On eliquis.  Followed by cardiology.        CAD (coronary artery disease)    Saw cardiology 03/27/18.  Stable.  Follow pressures.  No further syncope or near syncopal episodes.        Chronic back pain    Has seen Dr Sharlet Salina previously.  Persistent right leg pain. Now being followed at pain clinic.  Planning for ESI.        Collagenous colitis    Bowels vary.  Followed by GI.        Depression    On zoloft.  Stable.        GERD (gastroesophageal reflux disease)    Controlled on current regimen.  Follow.        Hypertension    Blood pressure elevated.  Increased pain.  Feels contributes to variation.  Regimen as outlined.  Will increase losartan to 100mg  q day.  Hold amlodipine for now.  May need to add back amlodipine if persistent elevation.  Follow pressures.        Hyponatremia    Persistent low sodium.  Has been worked up extensively.  Has seen nephrology.  Decrease alcohol intake.  Follow electrolytes.       Iron deficiency anemia    Followed by hematology.        MGUS (monoclonal gammopathy of unknown significance)    Followed by hematology.  Moderate COPD (chronic obstructive pulmonary disease) ([facility])    Breathing stable.        Relevant Medications   azelastine (ASTELIN) 0.1 % nasal spray   Pulmonary hypertension ([place])    Followed by pulmonary and cardiology.            Einar Pheasant, MD

## 2018-04-15 ENCOUNTER — Telehealth: Payer: Self-pay

## 2018-04-15 DIAGNOSIS — K52831 Collagenous colitis: Secondary | ICD-10-CM | POA: Diagnosis not present

## 2018-04-15 DIAGNOSIS — D509 Iron deficiency anemia, unspecified: Secondary | ICD-10-CM | POA: Diagnosis not present

## 2018-04-15 NOTE — Telephone Encounter (Signed)
Spoke with pt, informed him that he has received clearance from cardiologist to stop Eliquis 3 days prior to procedure. Pt. Instructed to take last dose 04/25/18 in preparation of 04/29/18 procedure.

## 2018-04-16 ENCOUNTER — Ambulatory Visit
Admission: RE | Admit: 2018-04-16 | Discharge: 2018-04-16 | Disposition: A | Discharge status: Home / Self Care | Payer: PPO | Source: Ambulatory Visit | Attending: Pulmonary Disease | Admitting: Pulmonary Disease

## 2018-04-16 ENCOUNTER — Ambulatory Visit (HOSPITAL_COMMUNITY): Payer: PPO

## 2018-04-16 DIAGNOSIS — F17200 Nicotine dependence, unspecified, uncomplicated: Secondary | ICD-10-CM | POA: Insufficient documentation

## 2018-04-16 DIAGNOSIS — J8410 Pulmonary fibrosis, unspecified: Secondary | ICD-10-CM | POA: Diagnosis not present

## 2018-04-16 DIAGNOSIS — J449 Chronic obstructive pulmonary disease, unspecified: Secondary | ICD-10-CM

## 2018-04-16 MED ORDER — ALBUTEROL SULFATE (2.5 MG/3ML) 0.083% IN NEBU
2.5000 mg | INHALATION_SOLUTION | Freq: Once | RESPIRATORY_TRACT | Status: AC
  Administered 2018-04-16: 2.5 mg via RESPIRATORY_TRACT
  Filled 2018-04-16: qty 3

## 2018-04-19 ENCOUNTER — Encounter: Payer: Self-pay | Admitting: Internal Medicine

## 2018-04-19 NOTE — Assessment & Plan Note (Signed)
Bowels vary.  Followed by GI.

## 2018-04-19 NOTE — Assessment & Plan Note (Signed)
Has seen Dr Sharlet Salina previously.  Persistent right leg pain. Now being followed at pain clinic.  Planning for ESI.

## 2018-04-19 NOTE — Assessment & Plan Note (Signed)
Followed by pulmonary and cardiology.  

## 2018-04-19 NOTE — Assessment & Plan Note (Signed)
Breathing stable.

## 2018-04-19 NOTE — Assessment & Plan Note (Signed)
Saw cardiology 03/27/18.  Stable.  Follow pressures.  No further syncope or near syncopal episodes.

## 2018-04-19 NOTE — Assessment & Plan Note (Signed)
Persistent low sodium.  Has been worked up extensively.  Has seen nephrology.  Decrease alcohol intake.  Follow electrolytes.

## 2018-04-19 NOTE — Assessment & Plan Note (Signed)
On zoloft.  Stable.  

## 2018-04-19 NOTE — Assessment & Plan Note (Signed)
S/p cardioversion.  On eliquis.  Followed by cardiology.   

## 2018-04-19 NOTE — Assessment & Plan Note (Signed)
Blood pressure elevated.  Increased pain.  Feels contributes to variation.  Regimen as outlined.  Will increase losartan to 100mg  q day.  Hold amlodipine for now.  May need to add back amlodipine if persistent elevation.  Follow pressures.

## 2018-04-19 NOTE — Assessment & Plan Note (Signed)
Followed by hematology 

## 2018-04-19 NOTE — Assessment & Plan Note (Signed)
Controlled on current regimen.  Follow.  

## 2018-04-22 ENCOUNTER — Other Ambulatory Visit (INDEPENDENT_AMBULATORY_CARE_PROVIDER_SITE_OTHER): Payer: Self-pay | Admitting: Vascular Surgery

## 2018-04-22 DIAGNOSIS — I712 Thoracic aortic aneurysm, without rupture, unspecified: Secondary | ICD-10-CM

## 2018-04-28 ENCOUNTER — Ambulatory Visit: Payer: PPO | Admitting: Pulmonary Disease

## 2018-04-28 ENCOUNTER — Encounter: Payer: Self-pay | Admitting: Pulmonary Disease

## 2018-04-28 VITALS — BP 152/90 | HR 58 | Ht 71.0 in | Wt 185.0 lb

## 2018-04-28 DIAGNOSIS — R0609 Other forms of dyspnea: Secondary | ICD-10-CM

## 2018-04-28 DIAGNOSIS — F17200 Nicotine dependence, unspecified, uncomplicated: Secondary | ICD-10-CM | POA: Diagnosis not present

## 2018-04-28 DIAGNOSIS — J449 Chronic obstructive pulmonary disease, unspecified: Secondary | ICD-10-CM | POA: Diagnosis not present

## 2018-04-28 NOTE — Patient Instructions (Signed)
Continue Breo inhaler  Continue albuterol as needed for increased shortness of breath, wheezing, chest tightness, cough  I will discuss your shortness of breath with Dr. Saralyn Pilar for him to consider whether further cardiac evaluation is warranted  I recommended a flu shot this year.  You will obtain after you get your back injections  Follow-up in 6 months.  Call sooner if needed

## 2018-04-28 NOTE — Progress Notes (Signed)
PULMONARY OFFICE FOLLOW UP NOTE  Requesting MD/Service: Einar Pheasant, MD Date of initial consultation: 09/18/15 Reason for consultation: Dyspnea  [name] smoker underwent initial evaluation for progressive dyspnea on 09/18/15 with finding of cardiac arrhythmia - probable atrial flutter with variable conduction and frequent PVCs. Initial impression: Likely COPD. Acute worsening of respiratory symptoms possibly due to cardiac arrhythmia. Case discussed with Dr Saralyn Pilar. Trial of Incruse initiated  Data: NM stress 08/17/15: 1. Equivocal ETT. 2. Normal left ventricular function. 3. Normal wall motion. 4. No evidence for scar or ischemia TTE 08/02/15: LVEF 50%, modertate LVH. Mod MR. RVSP est 57 mmHg. LA moderately dilated 09/22/15 PFTs: mild-mod obstruction. Normal TLC. Severe reduction DLCO 10/09/15 ambulatory oximetry: no desaturation.  11/15/15 Successful DCCV ([facility]) 03/26/17 CT chest: Emphysematous and bronchitic changes consistent with COPD with progressive interstitial lung disease changes at the upper lobes bilaterally 04/16/18 FPTs: FVC: 4.37 > 4.61 L (105 > 111 %pred), FEV1: 2.07 > 2.08 L (64 > 64 %pred), FEV1/FVC: 47%, TLC: 7.19 L (108 %pred), DLCO 42 %pred, DLCO/VA 51% predicted    INTERVAL: Last visit 01/20/2018.  No major events   SUBJ: This is a scheduled follow-up.  He has not smoked since July 29.Marland Kitchen  He remains on Breo inhaler.  He is not using Spiriva inhaler.  He almost never uses his albuterol inhaler.  He continues to have moderate exertional dyspnea.  He has intermittent nonproductive cough.  He denies CP, fever, purulent sputum, hemoptysis, LE edema and calf tenderness.   Vitals:   04/28/18 1027 04/28/18 1032  BP:  (!) 152/90  Pulse:  (!) 58  SpO2:  99%  Weight: 185 lb (83.9 kg)   Height: 5\' 11"  (1.803 m)   Room air   EXAM:  Gen: NAD HEENT: NCAT, sclera white Neck: No JVD Lungs: breath sounds full, no wheezes or other adventitious  sounds Cardiovascular: RRR, no murmurs Abdomen: Soft, nontender, normal BS Ext: without clubbing, cyanosis, edema Neuro: grossly intact Skin: Limited exam, no lesions noted   DATA:  BMP Latest Ref Rng & Units 02/18/2018 12/23/2017 12/17/2017  Glucose 70 - 99 mg/dL 102(H) 102(H) 77  BUN 8 - 23 mg/dL 18 16 11   Creatinine 0.61 - 1.24 mg/dL 1.18 0.95 0.96  Sodium 135 - 145 mmol/L 129(L) 129(L) 130(L)  Potassium 3.5 - 5.1 mmol/L 4.5 4.7 5.0  Chloride 98 - 111 mmol/L 100 96(L) 97  CO2 22 - 32 mmol/L 21(L) 23 25  Calcium 8.9 - 10.3 mg/dL 9.2 8.9 9.8    CBC Latest Ref Rng & Units 03/25/2018 02/18/2018 12/23/2017  WBC 3.8 - 10.6 K/uL 5.5 8.3 8.1  Hemoglobin 13.0 - 18.0 g/dL 11.4(L) 12.3(L) 10.5(L)  Hematocrit 40.0 - 52.0 % 33.2(L) 35.5(L) 30.7(L)  Platelets 150 - 440 K/uL 386 405 409    CXR 8/29: Patchy chronic interstitial prominence.  No acute findings.  No significant change from previously  IMPRESSION:   COPD, moderate ([facility])  Smoker  DOE (dyspnea on exertion)  Dyspnea appears to be mildly out of proportion to findings on PFTs.  He does have a history of coronary artery disease.  However, he denies exertional chest pain and he recovers from exertional dyspnea within minutes.  PLAN:  We again discussed smoking.  I congratulated him on his success at quitting.  I counseled regarding strategies for remaining abstinent  He is to continue Breo inhaler 100/25 daily.  Rinse mouth after use  He is to continue albuterol inhaler as needed for increased  shortness of breath, wheezing, chest tightness, cough  He sees Dr. Saralyn Pilar in November of this year.  I wonder whether a repeat echocardiogram and/or stress test might be indicated in light of his symptoms of exertional dyspnea.  I recommended that he obtain a flu shot this year  Follow-up in 6 months.  Instructed to call sooner if needed  Merton Border, MD PCCM service Mobile [phone] Pager [phone] 04/28/2018 11:11 AM

## 2018-04-29 ENCOUNTER — Encounter: Payer: Self-pay | Admitting: Student in an Organized Health Care Education/Training Program

## 2018-04-29 ENCOUNTER — Ambulatory Visit
Admission: RE | Admit: 2018-04-29 | Discharge: 2018-04-29 | Disposition: A | Discharge status: Home / Self Care | Payer: PPO | Source: Ambulatory Visit | Attending: Student in an Organized Health Care Education/Training Program | Admitting: Student in an Organized Health Care Education/Training Program

## 2018-04-29 ENCOUNTER — Other Ambulatory Visit: Payer: Self-pay | Admitting: Internal Medicine

## 2018-04-29 ENCOUNTER — Ambulatory Visit (HOSPITAL_BASED_OUTPATIENT_CLINIC_OR_DEPARTMENT_OTHER): Payer: PPO | Admitting: Student in an Organized Health Care Education/Training Program

## 2018-04-29 ENCOUNTER — Other Ambulatory Visit: Payer: Self-pay

## 2018-04-29 VITALS — BP 173/90 | HR 57 | Temp 97.6°F | Resp 16 | Ht 71.0 in | Wt 185.0 lb

## 2018-04-29 DIAGNOSIS — M545 Low back pain: Secondary | ICD-10-CM | POA: Diagnosis not present

## 2018-04-29 DIAGNOSIS — M5136 Other intervertebral disc degeneration, lumbar region: Secondary | ICD-10-CM

## 2018-04-29 DIAGNOSIS — M5116 Intervertebral disc disorders with radiculopathy, lumbar region: Secondary | ICD-10-CM | POA: Insufficient documentation

## 2018-04-29 DIAGNOSIS — M79604 Pain in right leg: Secondary | ICD-10-CM | POA: Insufficient documentation

## 2018-04-29 DIAGNOSIS — M5416 Radiculopathy, lumbar region: Secondary | ICD-10-CM

## 2018-04-29 MED ORDER — LIDOCAINE HCL 2 % IJ SOLN
10.0000 mL | Freq: Once | INTRAMUSCULAR | Status: AC
  Administered 2018-04-29: 400 mg
  Filled 2018-04-29: qty 20

## 2018-04-29 MED ORDER — IOPAMIDOL (ISOVUE-M 200) INJECTION 41%
10.0000 mL | Freq: Once | INTRAMUSCULAR | Status: AC
  Administered 2018-04-29: 10 mL via EPIDURAL
  Filled 2018-04-29: qty 10

## 2018-04-29 MED ORDER — ROPIVACAINE HCL 2 MG/ML IJ SOLN
2.0000 mL | Freq: Once | INTRAMUSCULAR | Status: AC
  Administered 2018-04-29: 10 mL via EPIDURAL
  Filled 2018-04-29: qty 10

## 2018-04-29 MED ORDER — SODIUM CHLORIDE 0.9 % IJ SOLN
INTRAMUSCULAR | Status: AC
  Filled 2018-04-29: qty 10

## 2018-04-29 MED ORDER — SODIUM CHLORIDE 0.9% FLUSH
2.0000 mL | Freq: Once | INTRAVENOUS | Status: AC
  Administered 2018-04-29: 10 mL

## 2018-04-29 MED ORDER — DEXAMETHASONE SODIUM PHOSPHATE 10 MG/ML IJ SOLN
10.0000 mg | Freq: Once | INTRAMUSCULAR | Status: AC
  Administered 2018-04-29: 10 mg
  Filled 2018-04-29: qty 1

## 2018-04-29 NOTE — Patient Instructions (Addendum)
1. Ok to restart Eliquis tomorrow so long as NO new leg weakness and after phone call from nursing staff to ensure that you are doing ok  Pain Management Discharge Instructions  General Discharge Instructions :  If you need to reach your doctor call: Monday-Friday 8:00 am - 4:00 pm at [phone] or toll free [phone].  After clinic hours [phone] to have operator reach doctor.  Bring all of your medication bottles to all your appointments in the pain clinic.  To cancel or reschedule your appointment with Pain Management please remember to call 24 hours in advance to avoid a fee.  Refer to the educational materials which you have been given on: General Risks, I had my Procedure. Discharge Instructions, Post Sedation.  Post Procedure Instructions:  ____________________________________________________________________________________________  Post-procedure Information What to expect: Most procedures involve the use of a local anesthetic (numbing medicine), and a steroid (anti-inflammatory medicine).  The local anesthetics may cause temporary numbness and weakness of the legs or arms, depending on the location of the block. This numbness/weakness may last 4-6 hours, depending on the local anesthetic used. In rare instances, it can last up to 24 hours. While numb, you must be very careful not to injure the extremity.  After any procedure, you could expect the pain to get better within 15-20 minutes. This relief is temporary and may last 4-6 hours. Once the local anesthetics wears off, you could experience discomfort, possibly more than usual, for up to 10 (ten) days. In the case of radiofrequencies, it may last up to 6 weeks. Surgeries may take up to 8 weeks for the healing process. The discomfort is due to the irritation caused by needles going through skin and muscle. To minimize the discomfort, we recommend using ice the first day, and heat from then on. The ice should be applied  for 15 minutes on, and 15 minutes off. Keep repeating this cycle until bedtime. Avoid applying the ice directly to the skin, to prevent frostbite. Heat should be used daily, until the pain improves (4-10 days). Be careful not to burn yourself.  Occasionally you may experience muscle spasms or cramps. These occur as a consequence of the irritation caused by the needle sticks to the muscle and the blood that will inevitably be lost into the surrounding muscle tissue. Blood tends to be very irritating to tissues, which tend to react by going into spasm. These spasms may start the same day of your procedure, but they may also take days to develop. This late onset type of spasm or cramp is usually caused by electrolyte imbalances triggered by the steroids, at the level of the kidney. Cramps and spasms tend to respond well to muscle relaxants, multivitamins (some are triggered by the procedure, but may have their origins in vitamin deficiencies), and "Gatorade", or any sports drinks that can replenish any electrolyte imbalances. (If you are a diabetic, ask your pharmacist to get you a sugar-free brand.) Warm showers or baths may also be helpful. Stretching exercises are highly recommended.  General Instructions:  Be alert for signs of possible infection: redness, swelling, heat, red streaks, elevated temperature, and/or fever. These typically appear 4 to 6 days after the procedure. Immediately notify your doctor if you experience unusual bleeding, difficulty breathing, or loss of bowel or bladder control. If you experience increased pain, do not increase your pain medicine intake, unless instructed by your pain physician.  Post-Procedure Care:  Be careful in moving about. Muscle spasms in the area of the injection  may occur. Applying ice or heat to the area is often helpful. The incidence of spinal headaches after epidural injections ranges between 1.4% and 6%. If you develop a headache that does not seem to  respond to conservative therapy, please let your physician know. This can be treated with an epidural blood patch.   Post-procedure numbness or redness is to be expected, however it should average 4 to 6 hours. If numbness and weakness of your extremities begins to develop 4 to 6 hours after your procedure, and is felt to be progressing and worsening, immediately contact your physician.  Diet:  If you experience nausea, do not eat until this sensation goes away. If you had a "Stellate Ganglion Block" for upper extremity "Reflex Sympathetic Dystrophy", do not eat or drink until your hoarseness goes away. In any case, always start with liquids first and if you tolerate them well, then slowly progress to more solid foods.  Activity:  For the first 4 to 6 hours after the procedure, use caution in moving about as you may experience numbness and/or weakness. Use caution in cooking, using household electrical appliances, and climbing steps. If you need to reach your Doctor call our office: [phone] (During business hours) or [phone] (After business hours).  Business Hours: Monday-Thursday 8:00 am - 4:00 PM    Fridays: Closed     In case of an emergency: In case of emergency, call 911 or go to the nearest emergency room and have the physician there call us.  Interpretation of Procedure Every nerve block has two components: a diagnostic component, and a treatment component. Unrealistic expectations are the most common causes of "perceived failure".  In a perfect world, a single nerve block should be able to completely and permanently eliminate the pain. Sadly, the world is not perfect.  Most pain management nerve blocks are performed using local anesthetics and steroids. Steroids are responsible for any long-term benefit that you may experience. Their purpose is to decrease any chronic swelling that may exist in the area. Steroids begin to work immediately after being injected. However, most  patients will not experience any benefits until 5 to 10 days after the injection, when the swelling has come down to the point where they can tell a difference. Steroids will only help if there is swelling to be treated. As such, they can assist with the diagnosis. If effective, they suggest an inflammatory component to the pain, and if ineffective, they rule out inflammation as the main cause or component of the problem. If the problem is one of mechanical compression, you will get no benefit from those steroids.   In the case of local anesthetics, they have a crucial role in the diagnosis of your condition. Most will begin to work within15 to 20 minutes after injection. The duration will depend on the type used (short- vs. Long-acting). It is of outmost importance that patients keep tract of their pain, after the procedure. To assist with this matter, a "Post-procedure Pain Diary" is provided. Make sure to complete it and to bring it back to your follow-up appointment.  As long as the patient keeps accurate, detailed records of their symptoms after every procedure, and returns to have those interpreted, every procedure will provide [place] with invaluable information. Even a block that does not provide the patient with any relief, will always provide [place] with information about the mechanism and the origin of the pain. The only time a nerve block can be considered a waste  of time is when patients do not keep track of the results, or do not keep their post-procedure appointment.  Reporting the results back to your physician The Pain Score  Pain is a subjective complaint. It cannot be seen, touched, or measured. We depend entirely on the patient's report of the pain in order to assess your condition and treatment. To evaluate the pain, we use a pain scale, where "0" means "No Pain", and a "10" is "the worst possible pain that you can even imagine" (i.e. something like been eaten alive by a shark or being torn apart  by a lion).   Use the Pain Scale provided. You will frequently be asked to rate your pain. Please be accurate, remember that medical decisions will be based on your responses. Please do not rate your pain above a 10. Doing so is actually interpreted as "symptom magnification" (exaggeration). To put this into perspective, when you tell us that your pain is at a 10 (ten), what you are saying is that there is nothing we can do to make this pain any worse. (Carefully think about that.) ____________________________________________________________________________________________    Please notify your doctor immediately if you have any unusual bleeding, trouble breathing or pain that is not related to your normal pain.  Depending on the type of procedure that was done, some parts of your body may feel week and/or numb.  This usually clears up by tonight or the next day.  Walk with the use of an assistive device or accompanied by an adult for the 24 hours.  You may use ice on the affected area for the first 24 hours.  Put ice in a Ziploc bag and cover with a towel and place against area 15 minutes on 15 minutes off.  You may switch to heat after 24 hours.

## 2018-04-29 NOTE — Progress Notes (Signed)
Safety precautions to be maintained throughout the outpatient stay will include: orient to surroundings, keep bed in low position, maintain call bell within reach at all times, provide assistance with transfer out of bed and ambulation.  

## 2018-04-29 NOTE — Progress Notes (Signed)
Patient's Name: Troy Lopez  MRN: [id number]  Referring Provider: Einar Pheasant, MD  DOB: Nov 05, 1940  PCP: Troy Pheasant, MD  DOS: 04/29/2018  Note by: Gillis Santa, MD  Service setting: Ambulatory outpatient  Specialty: Interventional Pain Management  Patient type: Established  Location: [facility] (AMB) Pain Management Facility  Visit type: Interventional Procedure   Primary Reason for Visit: Interventional Pain Management Treatment. CC: Leg Pain (right thigh) and Back Pain (lower)  Procedure:          Anesthesia, Analgesia, Anxiolysis:  Type: Diagnostic Inter-Laminar Epidural Steroid Injection           Region: Lumbar Level: L1-2 Level. Laterality: Right-Sided         Type: Local Anesthesia Indication(s): Analgesia  Route: Infiltration ([place]/IM) IV Access: Declined Sedation: Declined  Local Anesthetic: Lidocaine 1-2%   Indications: 1. Lumbar radiculopathy (Right L1/2)   2. Right leg pain   3. Lumbar degenerative disc disease    Pain Score: Pre-procedure: 3 /10 Post-procedure: 0-No pain/10  Last dose of Eliquis Saturday, September 7.  Pre-op Assessment:  Troy Lopez is a 77 y.o. (year old), male patient, seen today for interventional treatment. He  has a past surgical history that includes Bunionectomy (1989); Hemorrhoid surgery; Cholecystectomy (09/07); Eye surgery (Bilateral, 2010); Carpal tunnel release (Left, 2011); Cardiac catheterization (N/A, 11/15/2015); Kyphoplasty (N/A, 11/04/2016); Esophagogastroduodenoscopy (egd) with propofol (N/A, 03/05/2017); Colonoscopy with propofol (N/A, 03/05/2017); and Esophagogastroduodenoscopy (egd) with propofol (N/A, 10/17/2017). Troy Lopez has a current medication list which includes the following prescription(s): acetaminophen, albuterol, alendronate, azelastine, budesonide, docusate sodium, eliquis, fiber, fluticasone, fluticasone furoate-vilanterol, furosemide, gabapentin, isosorbide mononitrate, losartan, metoprolol tartrate, pantoprazole,  promethazine, sertraline, and vitamin b-12. His primarily concern today is the Leg Pain (right thigh) and Back Pain (lower)  Initial Vital Signs:  Pulse/HCG Rate: (!) 57ECG Heart Rate: 61 Temp: 97.6 F (36.4 C) Resp: 16 BP: (!) 172/57 SpO2: 96 %  BMI: Estimated body mass index is 25.8 kg/m as calculated from the following:   Height as of this encounter: 5\' 11"  (1.803 m).   Weight as of this encounter: 185 lb (83.9 kg).  Risk Assessment: Allergies: Reviewed. He is allergic to iodinated diagnostic agents.  Allergy Precautions: None required Coagulopathies: Reviewed. None identified.  Blood-thinner therapy: None at this time Active Infection(s): Reviewed. None identified. Troy Lopez is afebrile  Site Confirmation: Troy Lopez was asked to confirm the procedure and laterality before marking the site Procedure checklist: Completed Consent: Before the procedure and under the influence of no sedative(s), amnesic(s), or anxiolytics, the patient was informed of the treatment options, risks and possible complications. To fulfill our ethical and legal obligations, as recommended by the American Medical Association's Code of Ethics, I have informed the patient of my clinical impression; the nature and purpose of the treatment or procedure; the risks, benefits, and possible complications of the intervention; the alternatives, including doing nothing; the risk(s) and benefit(s) of the alternative treatment(s) or procedure(s); and the risk(s) and benefit(s) of doing nothing. The patient was provided information about the general risks and possible complications associated with the procedure. These may include, but are not limited to: failure to achieve desired goals, infection, bleeding, organ or nerve damage, allergic reactions, paralysis, and death. In addition, the patient was informed of those risks and complications associated to Spine-related procedures, such as failure to decrease pain; infection  (i.e.: Meningitis, epidural or intraspinal abscess); bleeding (i.e.: epidural hematoma, subarachnoid hemorrhage, or any other type of intraspinal or peri-dural bleeding); organ or nerve damage (  i.e.: Any type of peripheral nerve, nerve root, or spinal cord injury) with subsequent damage to sensory, motor, and/or autonomic systems, resulting in permanent pain, numbness, and/or weakness of one or several areas of the body; allergic reactions; (i.e.: anaphylactic reaction); and/or death. Furthermore, the patient was informed of those risks and complications associated with the medications. These include, but are not limited to: allergic reactions (i.e.: anaphylactic or anaphylactoid reaction(s)); adrenal axis suppression; blood sugar elevation that in diabetics may result in ketoacidosis or comma; water retention that in patients with history of congestive heart failure may result in shortness of breath, pulmonary edema, and decompensation with resultant heart failure; weight gain; swelling or edema; medication-induced neural toxicity; particulate matter embolism and blood vessel occlusion with resultant organ, and/or nervous system infarction; and/or aseptic necrosis of one or more joints. Finally, the patient was informed that Medicine is not an exact science; therefore, there is also the possibility of unforeseen or unpredictable risks and/or possible complications that may result in a catastrophic outcome. The patient indicated having understood very clearly. We have given the patient no guarantees and we have made no promises. Enough time was given to the patient to ask questions, all of which were answered to the patient's satisfaction. Troy Lopez has indicated that he wanted to continue with the procedure. Attestation: I, the ordering provider, attest that I have discussed with the patient the benefits, risks, side-effects, alternatives, likelihood of achieving goals, and potential problems during recovery  for the procedure that I have provided informed consent. Date  Time: 04/29/2018 11:52 AM  Pre-Procedure Preparation:  Monitoring: As per clinic protocol. Respiration, ETCO2, SpO2, BP, heart rate and rhythm monitor placed and checked for adequate function Safety Precautions: Patient was assessed for positional comfort and pressure points before starting the procedure. Time-out: I initiated and conducted the "Time-out" before starting the procedure, as per protocol. The patient was asked to participate by confirming the accuracy of the "Time Out" information. Verification of the correct person, site, and procedure were performed and confirmed by me, the nursing staff, and the patient. "Time-out" conducted as per Joint Commission's Universal Protocol (UP.01.01.01). Time: 1304  Description of Procedure:          Position: Prone with head of the table was raised to facilitate breathing. Target Area: The interlaminar space, initially targeting the lower laminar border of the superior vertebral body. Approach: Paramedial approach. Area Prepped: Entire Posterior Lumbar Region Prepping solution: ChloraPrep (2% chlorhexidine gluconate and 70% isopropyl alcohol) Safety Precautions: Aspiration looking for blood return was conducted prior to all injections. At no point did we inject any substances, as a needle was being advanced. No attempts were made at seeking any paresthesias. Safe injection practices and needle disposal techniques used. Medications properly checked for expiration dates. SDV (single dose vial) medications used. Description of the Procedure: Protocol guidelines were followed. The procedure needle was introduced through the skin, ipsilateral to the reported pain, and advanced to the target area. Bone was contacted and the needle walked caudad, until the lamina was cleared. The epidural space was identified using "loss-of-resistance technique" with 2-3 ml of PF-NaCl (0.9% NSS), in a 5cc LOR glass  syringe. Vitals:   04/29/18 1211 04/29/18 1308 04/29/18 1313  BP: (!) 172/57 (!) 172/98 (!) 173/90  Pulse: (!) 57    Resp: 16 16 16   Temp: 97.6 F (36.4 C)    TempSrc: Oral    SpO2: 96% 98% 98%  Weight: 185 lb (83.9 kg)    Height:  5\' 11"  (1.803 m)      Start Time: 1304 hrs. End Time: 1310 hrs. Materials:  Needle(s) Type: Epidural needle Gauge: 17G Length: 3.5-in Medication(s): Please see orders for medications and dosing details. 7 cc solution consisting of 5 cc of preservative-free saline, 2 cc of 0.2% ropivacaine, 1 cc of Decadron 10 mg/cc Imaging Guidance (Spinal):          Type of Imaging Technique: Fluoroscopy Guidance (Spinal) Indication(s): Assistance in needle guidance and placement for procedures requiring needle placement in or near specific anatomical locations not easily accessible without such assistance. Exposure Time: Please see nurses notes. Contrast: Before injecting any contrast, we confirmed that the patient did not have an allergy to iodine, shellfish, or radiological contrast. Once satisfactory needle placement was completed at the desired level, radiological contrast was injected. Contrast injected under live fluoroscopy. No contrast complications. See chart for type and volume of contrast used. Fluoroscopic Guidance: I was personally present during the use of fluoroscopy. "Tunnel Vision Technique" used to obtain the best possible view of the target area. Parallax error corrected before commencing the procedure. "Direction-depth-direction" technique used to introduce the needle under continuous pulsed fluoroscopy. Once target was reached, antero-posterior, oblique, and lateral fluoroscopic projection used confirm needle placement in all planes. Images permanently stored in EMR. Interpretation: I personally interpreted the imaging intraoperatively. Adequate needle placement confirmed in multiple planes. Appropriate spread of contrast into desired area was observed. No  evidence of afferent or efferent intravascular uptake. No intrathecal or subarachnoid spread observed. Permanent images saved into the patient's record.  Antibiotic Prophylaxis:   Anti-infectives (From admission, onward)   None     Indication(s): None identified  Post-operative Assessment:  Post-procedure Vital Signs:  Pulse/HCG Rate: (!) 5761 Temp: 97.6 F (36.4 C) Resp: 16 BP: (!) 173/90 SpO2: 98 %  EBL: None  Complications: No immediate post-treatment complications observed by team, or reported by patient.  Note: The patient tolerated the entire procedure well. A repeat set of vitals were taken after the procedure and the patient was kept under observation following institutional policy, for this type of procedure. Post-procedural neurological assessment was performed, showing return to baseline, prior to discharge. The patient was provided with post-procedure discharge instructions, including a section on how to identify potential problems. Should any problems arise concerning this procedure, the patient was given instructions to immediately contact us, at any time, without hesitation. In any case, we plan to contact the patient by telephone for a follow-up status report regarding this interventional procedure.  Comments:  No additional relevant information. 5 out of 5 strength bilateral lower extremity: Plantar flexion, dorsiflexion, knee flexion, knee extension.  Plan of Care   Imaging Orders     DG C-Arm 1-60 Min-No Report Procedure Orders    No procedure(s) ordered today   Patient instructed to restart Eliquis tomorrow so long as he is not having any lower extremity weakness and after nursing staff has called him to make sure he is doing okay. Medications ordered for procedure: Meds ordered this encounter  Medications  . iopamidol (ISOVUE-M) 41 % intrathecal injection 10 mL  . ropivacaine (PF) 2 mg/mL (0.2%) (NAROPIN) injection 2 mL  . sodium chloride flush (NS) 0.9 %  injection 2 mL  . lidocaine (XYLOCAINE) 2 % (with pres) injection 200 mg  . dexamethasone (DECADRON) injection 10 mg   Medications administered: We administered iopamidol, ropivacaine (PF) 2 mg/mL (0.2%), sodium chloride flush, lidocaine, and dexamethasone.  See the medical record for exact dosing,  route, and time of administration.  New Prescriptions   No medications on file   Disposition: Discharge home  Discharge Date & Time: 04/29/2018; 1320 hrs.   Physician-requested Follow-up: Return in about 4 weeks (around 05/27/2018) for Post Procedure Evaluation.  Future Appointments  Date Time Provider [place]  05/21/2018 10:15 AM Gillis Santa, MD [facility] None  06/09/2018 11:00 AM Troy Pheasant, MD [facility]   Primary Care Physician: Troy Pheasant, MD Location: [facility] Outpatient [facility] Management Facility Note by: Gillis Santa, MD Date: 04/29/2018; Time: 2:45 PM  Disclaimer:  Medicine is not an exact science. The only guarantee in medicine is that nothing is guaranteed. It is important to note that the decision to proceed with this intervention was based on the information collected from the patient. The Data and conclusions were drawn from the patient's questionnaire, the interview, and the physical examination. Because the information was provided in large part by the patient, it cannot be guaranteed that it has not been purposely or unconsciously manipulated. Every effort has been made to obtain as much relevant data as possible for this evaluation. It is important to note that the conclusions that lead to this procedure are derived in large part from the available data. Always take into account that the treatment will also be dependent on availability of resources and existing treatment guidelines, considered by other Pain Management Practitioners as being common knowledge and practice, at the time of the intervention. For Medico-Legal purposes, it is also important to point out  that variation in procedural techniques and pharmacological choices are the acceptable norm. The indications, contraindications, technique, and results of the above procedure should only be interpreted and judged by a Board-Certified Interventional Pain Specialist with extensive familiarity and expertise in the same exact procedure and technique.

## 2018-05-03 ENCOUNTER — Encounter: Payer: Self-pay | Admitting: Internal Medicine

## 2018-05-13 ENCOUNTER — Other Ambulatory Visit: Payer: Self-pay | Admitting: Pharmacist

## 2018-05-13 NOTE — Patient Outreach (Signed)
[facility]) Care Management  05/13/2018   Ulus Hazen Narvaiz Aug 24, 1940 [id number]  Subjective:   77 year old male referred to [facility] for medication review.  [place] services requested for medication assistance.  PMHx includes, but not limited to, CAD, COPD, thoracic aortic aneurysm, HTN, collagenous colitis.   Upon medication review, noted that there are high cost medications that patient would qualify for through patient assistance.   He notes that he has been having a hard time regulating his blood pressure while not having episodes of syncope lately. He has stopped losartan, and is now taking amlodipine 10 mg daily and metoprolol 50 mg BID (1/2 of 100 mg tablet). He denies any syncopal episodes since this medication adjustment, and reports BP readings in the 120s/60s.    Objective:   Current Medications:  Current Outpatient Medications  Medication Sig Dispense Refill  . acetaminophen (TYLENOL) 500 MG tablet Take 500-1,000 mg by mouth 3 (three) times daily as needed for moderate pain or headache.    . albuterol (PROVENTIL HFA;VENTOLIN HFA) 108 (90 Base) MCG/ACT inhaler Inhale 2 puffs into the lungs every 6 (six) hours as needed for wheezing or shortness of breath.    Marland Kitchen alendronate (FOSAMAX) 70 MG tablet Take 70 mg by mouth once a week. Take with a full glass of water on an empty stomach. Takes on Sundays    . azelastine (ASTELIN) 0.1 % nasal spray Place 1 spray into both nostrils 2 (two) times daily. Use in each nostril as directed 30 mL 1  . ELIQUIS 5 MG TABS tablet Take 5 mg by mouth 2 (two) times daily.     Marland Kitchen FIBER PO Take 2 capsules by mouth daily.    . fluticasone (FLONASE) 50 MCG/ACT nasal spray Place 2 sprays into both nostrils daily. 16 g 0  . fluticasone furoate-vilanterol (BREO ELLIPTA) 100-25 MCG/INH AEPB Inhale 1 puff into the lungs daily. 180 each 3  . furosemide (LASIX) 20 MG tablet Take 1 tablet (20 mg total) by mouth daily as needed.  30 tablet 0  . gabapentin (NEURONTIN) 100 MG capsule Take 4 capsules (400 mg) daily in the morning and midday. Take 8 capsules (800 mg) daily in the evening. 480 capsule 1  . isosorbide mononitrate (IMDUR) 30 MG 24 hr tablet Take 1 tablet (30 mg total) by mouth once daily.    . metoprolol tartrate (LOPRESSOR) 100 MG tablet Take 1 tablet (100 mg total) by mouth 2 (two) times daily. 60 tablet 2  . pantoprazole (PROTONIX) 40 MG tablet Take 1 tablet (40 mg total) by mouth 2 (two) times daily. 180 tablet 0  . sertraline (ZOLOFT) 50 MG tablet Take 1 tablet (50 mg total) by mouth daily. 30 tablet 0  . vitamin B-12 (CYANOCOBALAMIN) 1000 MCG tablet Take 1,000 mcg by mouth daily.     . budesonide (ENTOCORT EC) 3 MG 24 hr capsule Take 9 mg by mouth daily as needed (colitis flare).     Marland Kitchen docusate sodium (COLACE) 100 MG capsule     . losartan (COZAAR) 100 MG tablet Take 1 tablet (100 mg total) by mouth daily. (Patient not taking: Reported on 05/13/2018) 90 tablet 0  . promethazine (PHENERGAN) 12.5 MG tablet Take 12.5 mg by mouth every 6 (six) hours as needed for nausea or vomiting.     No current facility-administered medications for this visit.     Functional Status:  No flowsheet data found.  Fall/Depression Screening: Fall Risk  04/29/2018 04/09/2018  10/27/2017  Falls in the past year? No Yes Yes  Number falls in past yr: - 1 1  Injury with Fall? - No Yes   PHQ 2/9 Scores 04/29/2018 04/09/2018 10/27/2017 08/22/2017 01/28/2017 07/17/2016 04/19/2016  PHQ - 2 Score 0 0 0 0 2 0 0  PHQ- 9 Score - - - - 9 - -    Assessment:   Drugs sorted by system:  Neurologic/Psychologic: sertraline  Cardiovascular: amlodipine, metoprolol, Eliquis, furosemide  Pulmonary/Allergy: albuterol HFA, Breo, azelastine nasal, fluticasone nasal  Gastrointestinal: docusate, budesonide, pantoprazole, promethazine  Endocrine: alendronate  Pain: acetaminophen, gabapentin   Infectious Diseases:  Vitamins/Minerals/Supplements:  calcium + vitamin D, vitamin B12  Medication Assistance: - Breo: Patient states that he receives this already from patient assistance - Albuterol HFA: Can apply to [employer] (manufact[employer] of [place]) for Ventolin HFA. Requires that patient have spent >$600 out of pocket at the pharmacy. Patient has met this.  - Eliquis: Can apply for [employer] assistance. Requires that patient have spent 3% of annual household income out of pocket at the pharmacy. Per patient's estimations, it appears patient has met this.   Plan:  - Discussed with patient; he will bring copy of 2018 tax returns to clinic. I will leave patient portion of applications at the front desk for patient to sign. I will fax provider portion to be signed by Dr. Saralyn Pilar for Eliquis, by Dr. Alva Garnet for Ventolin.   Catie [name], PharmD PGY2 Ambulatory Care Pharmacy Resident Phone: [phone]

## 2018-05-14 ENCOUNTER — Other Ambulatory Visit: Payer: Self-pay | Admitting: Pulmonary Disease

## 2018-05-14 MED ORDER — ALBUTEROL SULFATE HFA 108 (90 BASE) MCG/ACT IN AERS: 2.0000 | INHALATION_SPRAY | Freq: Four times a day (QID) | RESPIRATORY_TRACT | 10 refills | Status: DC | PRN

## 2018-05-15 ENCOUNTER — Telehealth: Payer: Self-pay | Admitting: Pulmonary Disease

## 2018-05-15 ENCOUNTER — Other Ambulatory Visit: Payer: Self-pay | Admitting: Pharmacist

## 2018-05-15 NOTE — Telephone Encounter (Signed)
Spoke with Katie at [facility]. Confirmed with her that per last office note, patient was no longer using Spiriva. Per 01/20/18 note, patient "did not feel Spiriva provided much benefit." Notified her we will not move forward with patient assistance for Spiriva at this time.

## 2018-05-15 NOTE — Patient Outreach (Signed)
[facility]) Care Management  05/15/2018  Troy Lopez [birth year] [id number]   Patient reached back out to me. He asked if I would be able to help with Patient Assistance for Spiriva. Upon chart review, I see that this medication was discontinued by Dr. Alva Garnet at the 04/28/2018 appointment, as the patient was not taking it. I relayed this information to [name], and he noted that he wasn't taking it because he couldn't afford it.   Upon original medication review, patient noted that he was not needing to use albuterol rescue very often d/t breathing being well controlled on Breo.   Contacted Dr. Alva Garnet office for guidance on whether he wants the patient on LAMA therapy in addition to LABA/ICS. Left message for his nurse.   Catie [name], PharmD PGY2 Ambulatory Care Pharmacy Resident Phone: [phone]

## 2018-05-15 NOTE — Telephone Encounter (Signed)
Katie from [facility] calling Has been helping patient with medication assistance for Breo and Albuterol Patient is requesting help with Spiriva assistance but according to notes he has been taken off of Spiriva  Would like clarification before beginning process Please call [phone] to discuss

## 2018-05-18 ENCOUNTER — Other Ambulatory Visit: Payer: Self-pay | Admitting: Pharmacist

## 2018-05-18 NOTE — Patient Outreach (Signed)
[facility]) Care Management  05/18/2018  Troy Lopez 1940/10/25 [id number]  77 year old male referred to [facility] for medication review.  [place] services requested for medication assistance.  PMHx includes, but not limited to, CAD, COPD, thoracic aortic aneurysm, HTN, collagenous colitis.   Heard back from Dr. Lora Havens office; previously, LAMA therapy was discontinued in this patient d/t lack of benefit. They do not recommend restarting Spiriva and completing patient assistance paperwork at this time.   Contacted patient to inform. He is going to swing by clinic today to sign patient assistance for Ventolin and Eliquis.   Catie [name], PharmD PGY2 Ambulatory Care Pharmacy Resident, [place] [facility] Phone: [phone]

## 2018-05-19 ENCOUNTER — Other Ambulatory Visit: Payer: Self-pay | Admitting: Pharmacist

## 2018-05-19 NOTE — Patient Outreach (Signed)
[facility]) Care Management  05/19/2018  Troy Lopez 05-07-1941 [id number]   Received patient signatures and provider portions. Submitted patient assistance application for Eliquis (Dr. Saralyn Pilar) to [place] and for Ventolin (Dr. Alva Garnet) to [facility].   Plan - Will f/u in 5-7 business days with each program for status of the applications.   Catie [name], PharmD PGY2 Ambulatory Care Pharmacy Resident, [place] [facility] Phone: [phone]

## 2018-05-21 ENCOUNTER — Other Ambulatory Visit: Payer: Self-pay

## 2018-05-21 ENCOUNTER — Ambulatory Visit
Payer: PPO | Attending: Student in an Organized Health Care Education/Training Program | Admitting: Student in an Organized Health Care Education/Training Program

## 2018-05-21 ENCOUNTER — Encounter: Payer: Self-pay | Admitting: Student in an Organized Health Care Education/Training Program

## 2018-05-21 VITALS — BP 108/66 | HR 61 | Temp 97.6°F | Resp 18 | Ht 71.0 in | Wt 186.0 lb

## 2018-05-21 DIAGNOSIS — Z8249 Family history of ischemic heart disease and other diseases of the circulatory system: Secondary | ICD-10-CM | POA: Diagnosis not present

## 2018-05-21 DIAGNOSIS — I2510 Atherosclerotic heart disease of native coronary artery without angina pectoris: Secondary | ICD-10-CM | POA: Insufficient documentation

## 2018-05-21 DIAGNOSIS — G894 Chronic pain syndrome: Secondary | ICD-10-CM | POA: Diagnosis not present

## 2018-05-21 DIAGNOSIS — M5136 Other intervertebral disc degeneration, lumbar region: Secondary | ICD-10-CM | POA: Insufficient documentation

## 2018-05-21 DIAGNOSIS — E785 Hyperlipidemia, unspecified: Secondary | ICD-10-CM | POA: Insufficient documentation

## 2018-05-21 DIAGNOSIS — G4730 Sleep apnea, unspecified: Secondary | ICD-10-CM | POA: Diagnosis not present

## 2018-05-21 DIAGNOSIS — M48061 Spinal stenosis, lumbar region without neurogenic claudication: Secondary | ICD-10-CM | POA: Insufficient documentation

## 2018-05-21 DIAGNOSIS — M5116 Intervertebral disc disorders with radiculopathy, lumbar region: Secondary | ICD-10-CM | POA: Insufficient documentation

## 2018-05-21 DIAGNOSIS — Z823 Family history of stroke: Secondary | ICD-10-CM | POA: Insufficient documentation

## 2018-05-21 DIAGNOSIS — F419 Anxiety disorder, unspecified: Secondary | ICD-10-CM | POA: Insufficient documentation

## 2018-05-21 DIAGNOSIS — I10 Essential (primary) hypertension: Secondary | ICD-10-CM | POA: Diagnosis not present

## 2018-05-21 DIAGNOSIS — G629 Polyneuropathy, unspecified: Secondary | ICD-10-CM | POA: Insufficient documentation

## 2018-05-21 DIAGNOSIS — Z9049 Acquired absence of other specified parts of digestive tract: Secondary | ICD-10-CM | POA: Insufficient documentation

## 2018-05-21 DIAGNOSIS — M79604 Pain in right leg: Secondary | ICD-10-CM | POA: Diagnosis present

## 2018-05-21 DIAGNOSIS — C880 Waldenstrom macroglobulinemia: Secondary | ICD-10-CM | POA: Insufficient documentation

## 2018-05-21 DIAGNOSIS — Z7983 Long term (current) use of bisphosphonates: Secondary | ICD-10-CM | POA: Insufficient documentation

## 2018-05-21 DIAGNOSIS — Z87891 Personal history of nicotine dependence: Secondary | ICD-10-CM | POA: Insufficient documentation

## 2018-05-21 DIAGNOSIS — J449 Chronic obstructive pulmonary disease, unspecified: Secondary | ICD-10-CM | POA: Diagnosis not present

## 2018-05-21 DIAGNOSIS — Z91041 Radiographic dye allergy status: Secondary | ICD-10-CM | POA: Diagnosis not present

## 2018-05-21 DIAGNOSIS — K219 Gastro-esophageal reflux disease without esophagitis: Secondary | ICD-10-CM | POA: Insufficient documentation

## 2018-05-21 DIAGNOSIS — D472 Monoclonal gammopathy: Secondary | ICD-10-CM | POA: Diagnosis not present

## 2018-05-21 DIAGNOSIS — F329 Major depressive disorder, single episode, unspecified: Secondary | ICD-10-CM | POA: Diagnosis not present

## 2018-05-21 DIAGNOSIS — K589 Irritable bowel syndrome without diarrhea: Secondary | ICD-10-CM | POA: Insufficient documentation

## 2018-05-21 DIAGNOSIS — Z79899 Other long term (current) drug therapy: Secondary | ICD-10-CM | POA: Insufficient documentation

## 2018-05-21 DIAGNOSIS — I712 Thoracic aortic aneurysm, without rupture: Secondary | ICD-10-CM | POA: Diagnosis not present

## 2018-05-21 DIAGNOSIS — Z7951 Long term (current) use of inhaled steroids: Secondary | ICD-10-CM | POA: Insufficient documentation

## 2018-05-21 DIAGNOSIS — Z7901 Long term (current) use of anticoagulants: Secondary | ICD-10-CM | POA: Insufficient documentation

## 2018-05-21 DIAGNOSIS — M5416 Radiculopathy, lumbar region: Secondary | ICD-10-CM | POA: Diagnosis not present

## 2018-05-21 NOTE — Progress Notes (Signed)
Patient's Name: Troy Lopez  MRN: [id number]  Referring Provider: Einar Pheasant, MD  DOB: [birth year]  PCP: Einar Pheasant, MD  DOS: 05/21/2018  Note by: Gillis Santa, MD  Service setting: Ambulatory outpatient  Specialty: Interventional Pain Management  Location: [facility] (AMB) Pain Management Facility    Patient type: Established   Primary Reason(s) for Visit: Encounter for post-procedure evaluation of chronic illness with mild to moderate exacerbation CC: Leg Pain (right)  HPI  Troy Lopez is a 77 y.o. year old, male patient, who comes today for a post-procedure evaluation. He has Iron deficiency anemia; MGUS (monoclonal gammopathy of unknown significance); CAD (coronary artery disease); Hypertension; Hyponatremia; GERD (gastroesophageal reflux disease); Sleep apnea; Ulcer; Chronic back pain; SOB (shortness of breath); Leg pain; Collagenous colitis; Health care maintenance; Personal history of tobacco use, presenting hazards to health; Sleep difficulties; Skin lesion; Depression; Moderate COPD (chronic obstructive pulmonary disease) ([place]); COPD exacerbation ([place]); History of compression fracture of spine; Alcohol abuse; Waldenstrom's macroglobulinemia ([place]); Chest pain; Blood in stool; Abnormal feces; Rectal polyp; Thoracic aortic aneurysm ([place]); Pulmonary hypertension ([place]); Atrial flutter ([place]); Hyperlipemia; Constipation; Fall; Rib pain on right side; Syncope; Lumbar radiculopathy (Right L1/2); Lumbar degenerative disc disease; Lumbar foraminal stenosis; and Chronic pain syndrome on their problem list. His primarily concern today is the Leg Pain (right)  Pain Assessment: Location: Right, Upper Leg Radiating: denies Onset: More than a month ago Duration: Chronic pain Quality: Numbness(sensitive to touch) Severity: 2 /10 (subjective, self-reported pain score)  Note: Reported level is compatible with observation.                         When using our objective Pain Scale, levels between 6 and  10/10 are said to belong in an emergency room, as it progressively worsens from a 6/10, described as severely limiting, requiring emergency care not usually available at an outpatient pain management facility. At a 6/10 level, communication becomes difficult and requires great effort. Assistance to reach the emergency department may be required. Facial flushing and profuse sweating along with potentially dangerous increases in heart rate and blood pressure will be evident. Effect on ADL:   Timing: Constant Modifying factors: TENS, heat BP: 108/66  HR: 61  Troy Lopez comes in today for post-procedure evaluation after the treatment done on 04/29/2018.  Further details on both, my assessment(s), as well as the proposed treatment plan, please see below.  Post-Procedure Assessment  04/29/2018 Procedure: Right L1-L2 ESI Pre-procedure pain score:  3/10 Post-procedure pain score: 0/10         Influential Factors: BMI: 25.94 kg/m Intra-procedural challenges: None observed.         Assessment challenges: None detected.              Reported side-effects: None.        Post-procedural adverse reactions or complications: None reported         Sedation: Please see nurses note. When no sedatives are used, the analgesic levels obtained are directly associated to the effectiveness of the local anesthetics. However, when sedation is provided, the level of analgesia obtained during the initial 1 hour following the intervention, is believed to be the result of a combination of factors. These factors may include, but are not limited to: 1. The effectiveness of the local anesthetics used. 2. The effects of the analgesic(s) and/or anxiolytic(s) used. 3. The degree of discomfort experienced by the patient at the time of the procedure. 4. The patients ability and  reliability in recalling and recording the events. 5. The presence and influence of possible secondary gains and/or psychosocial factors. Reported  result: Relief experienced during the 1st hour after the procedure: 100 % (Ultra-Short Term Relief)            Interpretative annotation: Clinically appropriate result. Analgesia during this period is likely to be Local Anesthetic and/or IV Sedative (Analgesic/Anxiolytic) related.          Effects of local anesthetic: The analgesic effects attained during this period are directly associated to the localized infiltration of local anesthetics and therefore cary significant diagnostic value as to the etiological location, or anatomical origin, of the pain. Expected duration of relief is directly dependent on the pharmacodynamics of the local anesthetic used. Long-acting (4-6 hours) anesthetics used.  Reported result: Relief during the next 4 to 6 hour after the procedure: 0 % (Short-Term Relief)            Interpretative annotation: Clinically appropriate result. Analgesia during this period is likely to be Local Anesthetic-related.          Long-term benefit: Defined as the period of time past the expected duration of local anesthetics (1 hour for short-acting and 4-6 hours for long-acting). With the possible exception of prolonged sympathetic blockade from the local anesthetics, benefits during this period are typically attributed to, or associated with, other factors such as analgesic sensory neuropraxia, antiinflammatory effects, or beneficial biochemical changes provided by agents other than the local anesthetics.  Reported result: Extended relief following procedure: 50 % (Long-Term Relief)            Interpretative annotation: Clinically possible results. Good relief. No permanent benefit expected. Inflammation plays a part in the etiology to the pain.          Current benefits: Defined as reported results that persistent at this point in time.   Analgesia: 50 % Troy Lopez reports improvement of extremity symptoms. Function: Somewhat improved ROM: Somewhat improved Interpretative annotation:  Partial relief. Therapeutic benefit observed. Effective diagnostic intervention.          Interpretation: Results would suggest a successful diagnostic and therapeutic intervention.                  Plan:  Set up procedure as a PRN palliative treatment option for this patient.                Laboratory Chemistry  Inflammation Markers (CRP: Acute Phase) (ESR: Chronic Phase) No results found for: CRP, ESRSEDRATE, LATICACIDVEN                       Rheumatology Markers No results found for: RF, ANA, LABURIC, URICUR, LYMEIGGIGMAB, LYMEABIGMQN, HLAB27                      Renal Function Markers Lab Results  Component Value Date   BUN 18 02/18/2018   CREATININE 1.18 02/18/2018   GFRAA >60 02/18/2018   GFRNONAA 59 (L) 02/18/2018                             Hepatic Function Markers Lab Results  Component Value Date   AST 21 02/18/2018   ALT 11 02/18/2018   ALBUMIN 3.3 (L) 02/18/2018   ALKPHOS 48 02/18/2018                        Electrolytes  Lab Results  Component Value Date   NA 129 (L) 02/18/2018   K 4.5 02/18/2018   CL 100 02/18/2018   CALCIUM 9.2 02/18/2018                        Neuropathy Markers Lab Results  Component Value Date   [id number] 702 04/19/2016   HGBA1C 5.9 12/17/2017                        CNS Tests No results found for: COLORCSF, APPEARCSF, RBCCOUNTCSF, WBCCSF, POLYSCSF, LYMPHSCSF, EOSCSF, PROTEINCSF, GLUCCSF, JCVIRUS, CSFOLI, IGGCSF                      Bone Pathology Markers No results found for: VD25OH, [id number], [id number], [id number], 25OHVITD1, 25OHVITD2, 25OHVITD3, TESTOFREE, TESTOSTERONE                       Coagulation Parameters Lab Results  Component Value Date   INR 1.11 01/08/2017   LABPROT 14.3 01/08/2017   APTT 37 (H) 01/08/2017   PLT 386 03/25/2018                        Cardiovascular Markers Lab Results  Component Value Date   TROPONINI <0.03 02/18/2018   HGB 11.4 (L) 03/25/2018   HCT 33.2 (L) 03/25/2018                          CA Markers No results found for: CEA, CA125, LABCA2                      Note: Lab results reviewed.  Recent Diagnostic Imaging Results  DG C-Arm 1-60 Min-No Report Fluoroscopy was utilized by the requesting physician.  No radiographic  interpretation.   Complexity Note: Imaging results reviewed. Results shared with Troy Lopez, using Layman's terms.                         Meds   Current Outpatient Medications:  .  acetaminophen (TYLENOL) 500 MG tablet, Take 500-1,000 mg by mouth 3 (three) times daily as needed for moderate pain or headache., Disp: , Rfl:  .  albuterol (PROVENTIL HFA;VENTOLIN HFA) 108 (90 Base) MCG/ACT inhaler, Inhale 2 puffs into the lungs every 6 (six) hours as needed for wheezing or shortness of breath., Disp: 1 Inhaler, Rfl: 10 .  alendronate (FOSAMAX) 70 MG tablet, Take 70 mg by mouth once a week. Take with a full glass of water on an empty stomach. Takes on Sundays, Disp: , Rfl:  .  amLODipine (NORVASC) 10 MG tablet, Take 10 mg by mouth daily., Disp: , Rfl:  .  azelastine (ASTELIN) 0.1 % nasal spray, Place 1 spray into both nostrils 2 (two) times daily. Use in each nostril as directed, Disp: 30 mL, Rfl: 1 .  budesonide (ENTOCORT EC) 3 MG 24 hr capsule, Take 9 mg by mouth daily as needed (colitis flare). , Disp: , Rfl:  .  calcium-vitamin D (OSCAL WITH D) 500-200 MG-UNIT tablet, Take 2 tablets by mouth daily., Disp: , Rfl:  .  docusate sodium (COLACE) 100 MG capsule, , Disp: , Rfl:  .  ELIQUIS 5 MG TABS tablet, Take 5 mg by mouth 2 (two) times daily. , Disp: , Rfl:  .  FIBER PO, Take 2 capsules by  mouth daily., Disp: , Rfl:  .  fluticasone (FLONASE) 50 MCG/ACT nasal spray, Place 2 sprays into both nostrils daily., Disp: 16 g, Rfl: 0 .  fluticasone furoate-vilanterol (BREO ELLIPTA) 100-25 MCG/INH AEPB, Inhale 1 puff into the lungs daily., Disp: 180 each, Rfl: 3 .  furosemide (LASIX) 20 MG tablet, Take 1 tablet (20 mg total) by mouth daily as needed.,  Disp: 30 tablet, Rfl: 0 .  gabapentin (NEURONTIN) 100 MG capsule, Take 4 capsules (400 mg) daily in the morning and midday. Take 8 capsules (800 mg) daily in the evening., Disp: 480 capsule, Rfl: 1 .  isosorbide mononitrate (IMDUR) 30 MG 24 hr tablet, Take 1 tablet (30 mg total) by mouth once daily., Disp: , Rfl:  .  metoprolol tartrate (LOPRESSOR) 100 MG tablet, Take 1 tablet (100 mg total) by mouth 2 (two) times daily., Disp: 60 tablet, Rfl: 2 .  pantoprazole (PROTONIX) 40 MG tablet, Take 1 tablet (40 mg total) by mouth 2 (two) times daily., Disp: 180 tablet, Rfl: 0 .  promethazine (PHENERGAN) 12.5 MG tablet, Take 12.5 mg by mouth every 6 (six) hours as needed for nausea or vomiting., Disp: , Rfl:  .  sertraline (ZOLOFT) 50 MG tablet, Take 1 tablet (50 mg total) by mouth daily., Disp: 30 tablet, Rfl: 0 .  vitamin B-12 (CYANOCOBALAMIN) 1000 MCG tablet, Take 1,000 mcg by mouth daily. , Disp: , Rfl:  .  losartan (COZAAR) 100 MG tablet, Take 1 tablet (100 mg total) by mouth daily. (Patient not taking: Reported on 05/13/2018), Disp: 90 tablet, Rfl: 0  ROS  Constitutional: Denies any fever or chills Gastrointestinal: No reported hemesis, hematochezia, vomiting, or acute GI distress Musculoskeletal: Denies any acute onset joint swelling, redness, loss of ROM, or weakness Neurological: No reported episodes of acute onset apraxia, aphasia, dysarthria, agnosia, amnesia, paralysis, loss of coordination, or loss of consciousness  Allergies  Troy Lopez is allergic to iodinated diagnostic agents.  PFSH  Drug: Troy Lopez  reports that he does not use drugs. Alcohol:  reports that he drinks about 42.0 standard drinks of alcohol per week. Tobacco:  reports that he quit smoking about 2 years ago. His smoking use included cigarettes. He has never used smokeless tobacco. Medical:  has a past medical history of Adrenal gland anomaly, Anxiety, CAD (coronary artery disease), Carpal tunnel syndrome, Chronic  hyponatremia, Colitis, Colonic polyp, COPD (chronic obstructive pulmonary disease) ([place]), Degenerative disc disease, lumbar, Depression, Diverticulosis, Dyspnea, GERD (gastroesophageal reflux disease), H/O degenerative disc disease, Hypercholesterolemia, Hyperkalemia, Hyperlipidemia, Hypertension, Irritable bowel syndrome, Monoclonal gammopathy, Monoclonal gammopathy, Neuropathy, Personal history of tobacco use, presenting hazards to health (08/17/2015), Sleep apnea, and Vertebral compression fracture ([place]). Surgical: Troy Lopez  has a past surgical history that includes Bunionectomy (1989); Hemorrhoid surgery; Cholecystectomy (09/07); Eye surgery (Bilateral, 2010); Carpal tunnel release (Left, 2011); Cardiac catheterization (N/A, 11/15/2015); Kyphoplasty (N/A, 11/04/2016); Esophagogastroduodenoscopy (egd) with propofol (N/A, 03/05/2017); Colonoscopy with propofol (N/A, 03/05/2017); and Esophagogastroduodenoscopy (egd) with propofol (N/A, 10/17/2017). Family: family history includes Heart disease in his father; Stroke in his mother.  Constitutional Exam  General appearance: Well nourished, well developed, and well hydrated. In no apparent acute distress Vitals:   05/21/18 1010  BP: 108/66  Pulse: 61  Resp: 18  Temp: 97.6 F (36.4 C)  TempSrc: Oral  SpO2: 100%  Weight: 186 lb (84.4 kg)  Height: 5' 11" (1.803 m)   BMI Assessment: Estimated body mass index is 25.94 kg/m as calculated from the following:   Height as of this encounter:  5' 11" (1.803 m).   Weight as of this encounter: 186 lb (84.4 kg).  BMI interpretation table: BMI level Category Range association with higher incidence of chronic pain  <18 kg/m2 Underweight   18.5-24.9 kg/m2 Ideal body weight   25-29.9 kg/m2 Overweight Increased incidence by 20%  30-34.9 kg/m2 Obese (Class I) Increased incidence by 68%  35-39.9 kg/m2 Severe obesity (Class II) Increased incidence by 136%  >40 kg/m2 Extreme obesity (Class III) Increased incidence by  254%   Patient's current BMI Ideal Body weight  Body mass index is 25.94 kg/m. Ideal body weight: 75.3 kg (166 lb 0.1 oz) Adjusted ideal body weight: 78.9 kg (174 lb 0.1 oz)   BMI Readings from Last 4 Encounters:  05/21/18 25.94 kg/m  04/29/18 25.80 kg/m  04/28/18 25.80 kg/m  04/14/18 26.11 kg/m   Wt Readings from Last 4 Encounters:  05/21/18 186 lb (84.4 kg)  04/29/18 185 lb (83.9 kg)  04/28/18 185 lb (83.9 kg)  04/14/18 187 lb 3.2 oz (84.9 kg)  Psych/Mental status: Alert, oriented x 3 (person, place, & time)       Eyes: PERLA Respiratory: No evidence of acute respiratory distress  Cervical Spine Area Exam  Skin & Axial Inspection: No masses, redness, edema, swelling, or associated skin lesions Alignment: Symmetrical Functional ROM: Unrestricted ROM      Stability: No instability detected Muscle Tone/Strength: Functionally intact. No obvious neuro-muscular anomalies detected. Sensory (Neurological): Unimpaired Palpation: No palpable anomalies              Upper Extremity (UE) Exam    Side: Right upper extremity  Side: Left upper extremity  Skin & Extremity Inspection: Skin color, temperature, and hair growth are WNL. No peripheral edema or cyanosis. No masses, redness, swelling, asymmetry, or associated skin lesions. No contractures.  Skin & Extremity Inspection: Skin color, temperature, and hair growth are WNL. No peripheral edema or cyanosis. No masses, redness, swelling, asymmetry, or associated skin lesions. No contractures.  Functional ROM: Unrestricted ROM          Functional ROM: Unrestricted ROM          Muscle Tone/Strength: Functionally intact. No obvious neuro-muscular anomalies detected.  Muscle Tone/Strength: Functionally intact. No obvious neuro-muscular anomalies detected.  Sensory (Neurological): Unimpaired          Sensory (Neurological): Unimpaired          Palpation: No palpable anomalies              Palpation: No palpable anomalies                Provocative Test(s):  Phalen's test: deferred Tinel's test: deferred Apley's scratch test (touch opposite shoulder):  Action 1 (Across chest): deferred Action 2 (Overhead): deferred Action 3 (LB reach): deferred   Provocative Test(s):  Phalen's test: deferred Tinel's test: deferred Apley's scratch test (touch opposite shoulder):  Action 1 (Across chest): deferred Action 2 (Overhead): deferred Action 3 (LB reach): deferred    Thoracic Spine Area Exam  Skin & Axial Inspection: No masses, redness, or swelling Alignment: Symmetrical Functional ROM: Unrestricted ROM Stability: No instability detected Muscle Tone/Strength: Functionally intact. No obvious neuro-muscular anomalies detected. Sensory (Neurological): Unimpaired Muscle strength & Tone: No palpable anomalies  Lumbar Spine Area Exam  Skin & Axial Inspection: No masses, redness, or swelling Alignment: Symmetrical Functional ROM: Improved after treatment       Stability: No instability detected Muscle Tone/Strength: Functionally intact. No obvious neuro-muscular anomalies detected. Sensory (Neurological): Improved Palpation: No palpable  anomalies       Provocative Tests: Hyperextension/rotation test: deferred today       Lumbar quadrant test (Kemp's test): deferred today       Lateral bending test: Improved after treatment       Patrick's Maneuver: deferred today                   FABER test: deferred today                   S-I anterior distraction/compression test: deferred today         S-I lateral compression test: deferred today         S-I Thigh-thrust test: deferred today         S-I Gaenslen's test: deferred today          Gait & Posture Assessment  Ambulation: Unassisted Gait: Relatively normal for age and body habitus Posture: WNL   Lower Extremity Exam    Side: Right lower extremity  Side: Left lower extremity  Stability: No instability observed          Stability: No instability observed           Skin & Extremity Inspection: Skin color, temperature, and hair growth are WNL. No peripheral edema or cyanosis. No masses, redness, swelling, asymmetry, or associated skin lesions. No contractures.  Skin & Extremity Inspection: Skin color, temperature, and hair growth are WNL. No peripheral edema or cyanosis. No masses, redness, swelling, asymmetry, or associated skin lesions. No contractures.  Functional ROM: Unrestricted ROM                  Functional ROM: Unrestricted ROM                  Muscle Tone/Strength: Functionally intact. No obvious neuro-muscular anomalies detected.  Muscle Tone/Strength: Functionally intact. No obvious neuro-muscular anomalies detected.  Sensory (Neurological): Unimpaired  Sensory (Neurological): Unimpaired  Palpation: No palpable anomalies  Palpation: No palpable anomalies   Assessment  Primary Diagnosis & Pertinent Problem List: The primary encounter diagnosis was Lumbar radiculopathy (Right L1/2). Diagnoses of Lumbar degenerative disc disease, Lumbar foraminal stenosis, and Chronic pain syndrome were also pertinent to this visit.  Status Diagnosis  Responding Stable Persistent 1. Lumbar radiculopathy (Right L1/2)   2. Lumbar degenerative disc disease   3. Lumbar foraminal stenosis   4. Chronic pain syndrome     Problems updated and reviewed during this visit: Problem  Lumbar radiculopathy (Right L1/2)  Lumbar Degenerative Disc Disease  Lumbar Foraminal Stenosis  Chronic Pain Syndrome   General Recommendations: The pain condition that the patient suffers from is best treated with a multidisciplinary approach that involves an increase in physical activity to prevent de-conditioning and worsening of the pain cycle, as well as psychological counseling (formal and/or informal) to address the co-morbid psychological affects of pain. Treatment will often involve judicious use of pain medications and interventional procedures to decrease the pain, allowing the  patient to participate in the physical activity that will ultimately produce long-lasting pain reductions. The goal of the multidisciplinary approach is to return the patient to a higher level of overall function and to restore their ability to perform activities of daily living.  77 year old male with history of lumbar radiculopathy at right L1-L2 follows up status post right L1-L2 ESI who is endorsing approximately 50% pain relief after his epidural steroid injection.  Patient also finds a easier to perform activities of daily living and notes improvement in  his functional ability after the epidural.  We discussed repeating the epidural.  Patient states that he wants to hold off at this time since he is still obtaining pain relief which is very reasonable.  I will place a PRN order for repeat right L1-L2 ESI for lumbar radiculopathy.  Of note patient is on Eliquis for atrial flutter and should he want to return for repeat right L1-L2 ESI, he has been instructed to stop his Eliquis 5 days prior to his scheduled procedure.  I will also refer the patient for physical therapy, specifically aquatic therapy to help out with lower extremity deconditioning that he has experienced over the last couple of years due to pain and relative immobility.  Plan: -PRN lumbar ESI at right L1-L2, patient must stop Eliquis 5 days prior to scheduled procedure for atrial flutter -Referral to physical therapy, aquatic therapy for lower extremity strengthening and range of motion.  Plan of Care  Pharmacotherapy (Medications Ordered): No orders of the defined types were placed in this encounter.  Lab-work, procedure(s), and/or referral(s): Orders Placed This Encounter  Procedures  . Lumbar Epidural Injection  . Ambulatory referral to Physical Therapy   Time Note: Greater than 50% of the 25 minute(s) of face-to-face time spent with Troy Lopez, was spent in counseling/coordination of care regarding: Troy Lopez's primary  cause of pain, the treatment plan, treatment alternatives, the risks and possible complications of proposed treatment, going over the informed consent, the results, interpretation and significance of  his recent diagnostic interventional treatment(s) and realistic expectations.  Provider-requested follow-up: Return if symptoms worsen or fail to improve.  Future Appointments  Date Time Provider [place]  06/09/2018 11:00 AM Einar Pheasant, MD [facility]    Primary Care Physician: Einar Pheasant, MD Location: [facility] Outpatient [facility] Management [facility] Note by: Gillis Santa, M.D Date: 05/21/2018; Time: 11:31 AM  Patient Instructions   GENERAL RISKS AND COMPLICATIONS  What are the risk, side effects and possible complications? Generally speaking, most procedures are safe.  However, with any procedure there are risks, side effects, and the possibility of complications.  The risks and complications are dependent upon the sites that are lesioned, or the type of nerve block to be performed.  The closer the procedure is to the spine, the more serious the risks are.  Great care is taken when placing the radio frequency needles, block needles or lesioning probes, but sometimes complications can occur. 1. Infection: Any time there is an injection through the skin, there is a risk of infection.  This is why sterile conditions are used for these blocks.  There are four possible types of infection. 1. Localized skin infection. 2. Central Nervous System Infection-This can be in the form of Meningitis, which can be deadly. 3. Epidural Infections-This can be in the form of an epidural abscess, which can cause pressure inside of the spine, causing compression of the spinal cord with subsequent paralysis. This would require an emergency surgery to decompress, and there are no guarantees that the patient would recover from the paralysis. 4. Discitis-This is an infection of the intervertebral discs.  It  occurs in about 1% of discography procedures.  It is difficult to treat and it may lead to surgery.        2. Pain: the needles have to go through skin and soft tissues, will cause soreness.       3. Damage to internal structures:  The nerves to be lesioned may be near blood vessels or  other nerves which can be potentially damaged.       4. Bleeding: Bleeding is more common if the patient is taking blood thinners such as  aspirin, Coumadin, Ticiid, Plavix, etc., or if he/she have some genetic predisposition  such as hemophilia. Bleeding into the spinal canal can cause compression of the spinal  cord with subsequent paralysis.  This would require an emergency surgery to  decompress and there are no guarantees that the patient would recover from the  paralysis.       5. Pneumothorax:  Puncturing of a lung is a possibility, every time a needle is introduced in  the area of the chest or upper back.  Pneumothorax refers to free air around the  collapsed lung(s), inside of the thoracic cavity (chest cavity).  Another two possible  complications related to a similar event would include: Hemothorax and Chylothorax.   These are variations of the Pneumothorax, where instead of air around the collapsed  lung(s), you may have blood or chyle, respectively.       6. Spinal headaches: They may occur with any procedures in the area of the spine.       7. Persistent CSF (Cerebro-Spinal Fluid) leakage: This is a rare problem, but may occur  with prolonged intrathecal or epidural catheters either due to the formation of a fistulous  track or a dural tear.       8. Nerve damage: By working so close to the spinal cord, there is always a possibility of  nerve damage, which could be as serious as a permanent spinal cord injury with  paralysis.       9. Death:  Although rare, severe deadly allergic reactions known as "Anaphylactic  reaction" can occur to any of the medications used.      10. Worsening of the symptoms:  We can  always make thing worse.  What are the chances of something like this happening? Chances of any of this occuring are extremely low.  By statistics, you have more of a chance of getting killed in a motor vehicle accident: while driving to the hospital than any of the above occurring .  Nevertheless, you should be aware that they are possibilities.  In general, it is similar to taking a shower.  Everybody knows that you can slip, hit your head and get killed.  Does that mean that you should not shower again?  Nevertheless always keep in mind that statistics do not mean anything if you happen to be on the wrong side of them.  Even if a procedure has a 1 (one) in a 1,000,000 (million) chance of going wrong, it you happen to be that one..Also, keep in mind that by statistics, you have more of a chance of having something go wrong when taking medications.  Who should not have this procedure? If you are on a blood thinning medication (e.g. Coumadin, Plavix, see list of "Blood Thinners"), or if you have an active infection going on, you should not have the procedure.  If you are taking any blood thinners, please inform your physician.  How should I prepare for this procedure?  Do not eat or drink anything at least six hours prior to the procedure.  Bring a driver with you .  It cannot be a taxi.  Come accompanied by an adult that can drive you back, and that is strong enough to help you if your legs get weak or numb from the local anesthetic.  Take all  of your medicines the morning of the procedure with just enough water to swallow them.  If you have diabetes, make sure that you are scheduled to have your procedure done first thing in the morning, whenever possible.  If you have diabetes, take only half of your insulin dose and notify our nurse that you have done so as soon as you arrive at the clinic.  If you are diabetic, but only take blood sugar pills (oral hypoglycemic), then do not take them on  the morning of your procedure.  You may take them after you have had the procedure.  Do not take aspirin or any aspirin-containing medications, at least eleven (11) days prior to the procedure.  They may prolong bleeding.  Wear loose fitting clothing that may be easy to take off and that you would not mind if it got stained with Betadine or blood.  Do not wear any jewelry or perfume  Remove any nail coloring.  It will interfere with some of our monitoring equipment.  NOTE: Remember that this is not meant to be interpreted as a complete list of all possible complications.  Unforeseen problems may occur.  BLOOD THINNERS The following drugs contain aspirin or other products, which can cause increased bleeding during surgery and should not be taken for 2 weeks prior to and 1 week after surgery.  If you should need take something for relief of minor pain, you may take acetaminophen which is found in Tylenol,m Datril, Anacin-3 and Panadol. It is not blood thinner. The products listed below are.  Do not take any of the products listed below in addition to any listed on your instruction sheet.  A.P.C or A.P.C with Codeine Codeine Phosphate Capsules #3 Ibuprofen Ridaura  ABC compound Congesprin Imuran rimadil  Advil Cope Indocin Robaxisal  Alka-Seltzer Effervescent Pain Reliever and Antacid Coricidin or Coricidin-D  Indomethacin Rufen  Alka-Seltzer plus Cold Medicine Cosprin Ketoprofen S-A-C Tablets  Anacin Analgesic Tablets or Capsules Coumadin Korlgesic Salflex  Anacin Extra Strength Analgesic tablets or capsules CP-2 Tablets Lanoril Salicylate  Anaprox Cuprimine Capsules Levenox Salocol  Anexsia-D Dalteparin Magan Salsalate  Anodynos Darvon compound Magnesium Salicylate Sine-off  Ansaid Dasin Capsules Magsal Sodium Salicylate  Anturane Depen Capsules Marnal Soma  APF Arthritis pain formula Dewitt's Pills Measurin Stanback  Argesic Dia-Gesic Meclofenamic Sulfinpyrazone  Arthritis Bayer Timed  Release Aspirin Diclofenac Meclomen Sulindac  Arthritis pain formula Anacin Dicumarol Medipren Supac  Analgesic (Safety coated) Arthralgen Diffunasal Mefanamic Suprofen  Arthritis Strength Bufferin Dihydrocodeine Mepro Compound Suprol  Arthropan liquid Dopirydamole Methcarbomol with Aspirin Synalgos  ASA tablets/Enseals Disalcid Micrainin Tagament  Ascriptin Doan's Midol Talwin  Ascriptin A/D Dolene Mobidin Tanderil  Ascriptin Extra Strength Dolobid Moblgesic Ticlid  Ascriptin with Codeine Doloprin or Doloprin with Codeine Momentum Tolectin  Asperbuf Duoprin Mono-gesic Trendar  Aspergum Duradyne Motrin or Motrin IB Triminicin  Aspirin plain, buffered or enteric coated Durasal Myochrisine Trigesic  Aspirin Suppositories Easprin Nalfon Trillsate  Aspirin with Codeine Ecotrin Regular or Extra Strength Naprosyn Uracel  Atromid-S Efficin Naproxen Ursinus  Auranofin Capsules Elmiron Neocylate Vanquish  Axotal Emagrin Norgesic Verin  Azathioprine Empirin or Empirin with Codeine Normiflo Vitamin E  Azolid Emprazil Nuprin Voltaren  Bayer Aspirin plain, buffered or children's or timed BC Tablets or powders Encaprin Orgaran Warfarin Sodium  Buff-a-Comp Enoxaparin Orudis Zorpin  Buff-a-Comp with Codeine Equegesic Os-Cal-Gesic   Buffaprin Excedrin plain, buffered or Extra Strength Oxalid   Bufferin Arthritis Strength Feldene Oxphenbutazone   Bufferin plain or Extra Strength Feldene Capsules Oxycodone  with Aspirin   Bufferin with Codeine Fenoprofen Fenoprofen Pabalate or Pabalate-SF   Buffets II Flogesic Panagesic   Buffinol plain or Extra Strength Florinal or Florinal with Codeine Panwarfarin   Buf-Tabs Flurbiprofen Penicillamine   Butalbital Compound Four-way cold tablets Penicillin   Butazolidin Fragmin Pepto-Bismol   Carbenicillin Geminisyn Percodan   Carna Arthritis Reliever Geopen Persantine   Carprofen Gold's salt Persistin   Chloramphenicol Goody's Phenylbutazone   Chloromycetin  [place] Piroxlcam   Clmetidine heparin Plaquenil   Cllnoril Hyco-pap Ponstel   Clofibrate Hydroxy chloroquine Propoxyphen         Before stopping any of these medications, be sure to consult the physician who ordered them.  Some, such as Coumadin (Warfarin) are ordered to prevent or treat serious conditions such as "deep thrombosis", "pumonary embolisms", and other heart problems.  The amount of time that you may need off of the medication may also vary with the medication and the reason for which you were taking it.  If you are taking any of these medications, please make sure you notify your pain physician before you undergo any procedures.         Moderate Conscious Sedation, Adult Sedation is the use of medicines to promote relaxation and relieve discomfort and anxiety. Moderate conscious sedation is a type of sedation. Under moderate conscious sedation, you are less alert than normal, but you are still able to respond to instructions, touch, or both. Moderate conscious sedation is used during short medical and dental procedures. It is milder than deep sedation, which is a type of sedation under which you cannot be easily woken up. It is also milder than general anesthesia, which is the use of medicines to make you unconscious. Moderate conscious sedation allows you to return to your regular activities sooner. Tell a health care provider about:  Any allergies you have.  All medicines you are taking, including vitamins, herbs, eye drops, creams, and over-the-counter medicines.  Use of steroids (by mouth or creams).  Any problems you or family members have had with sedatives and anesthetic medicines.  Any blood disorders you have.  Any surgeries you have had.  Any medical conditions you have, such as sleep apnea.  Whether you are pregnant or may be pregnant.  Any use of cigarettes, alcohol, marijuana, or street drugs. What are the risks? Generally, this is a safe procedure.  However, problems may occur, including:  Getting too much medicine (oversedation).  Nausea.  Allergic reaction to medicines.  Trouble breathing. If this happens, a breathing tube may be used to help with breathing. It will be removed when you are awake and breathing on your own.  Heart trouble.  Lung trouble.  What happens before the procedure? Staying hydrated Follow instructions from your health care provider about hydration, which may include:  Up to 2 hours before the procedure - you may continue to drink clear liquids, such as water, clear fruit juice, black coffee, and plain tea.  Eating and drinking restrictions Follow instructions from your health care provider about eating and drinking, which may include:  8 hours before the procedure - stop eating heavy meals or foods such as meat, fried foods, or fatty foods.  6 hours before the procedure - stop eating light meals or foods, such as toast or cereal.  6 hours before the procedure - stop drinking milk or drinks that contain milk.  2 hours before the procedure - stop drinking clear liquids.  Medicine  Ask your health care provider about:  Changing or stopping your regular medicines. This is especially important if you are taking diabetes medicines or blood thinners.  Taking medicines such as aspirin and ibuprofen. These medicines can thin your blood. Do not take these medicines before your procedure if your health care provider instructs you not to.  Tests and exams  You will have a physical exam.  You may have blood tests done to show: ? How well your kidneys and liver are working. ? How well your blood can clot. General instructions  Plan to have someone take you home from the hospital or clinic.  If you will be going home right after the procedure, plan to have someone with you for 24 hours. What happens during the procedure?  An IV tube will be inserted into one of your veins.  Medicine to help you  relax (sedative) will be given through the IV tube.  The medical or dental procedure will be performed. What happens after the procedure?  Your blood pressure, heart rate, breathing rate, and blood oxygen level will be monitored often until the medicines you were given have worn off.  Do not drive for 24 hours. This information is not intended to replace advice given to you by your health care provider. Make sure you discuss any questions you have with your health care provider. Document Released: 04/30/2001 Document Revised: 01/09/2016 Document Reviewed: 11/25/2015 Elsevier Interactive Patient Education  2018 [place]. Epidural Steroid Injection Patient Information  Description: The epidural space surrounds the nerves as they exit the spinal cord.  In some patients, the nerves can be compressed and inflamed by a bulging disc or a tight spinal canal (spinal stenosis).  By injecting steroids into the epidural space, we can bring irritated nerves into direct contact with a potentially helpful medication.  These steroids act directly on the irritated nerves and can reduce swelling and inflammation which often leads to decreased pain.  Epidural steroids may be injected anywhere along the spine and from the neck to the low back depending upon the location of your pain.   After numbing the skin with local anesthetic (like Novocaine), a small needle is passed into the epidural space slowly.  You may experience a sensation of pressure while this is being done.  The entire block usually last less than 10 minutes.  Conditions which may be treated by epidural steroids:   Low back and leg pain  Neck and arm pain  Spinal stenosis  Post-laminectomy syndrome  Herpes zoster (shingles) pain  Pain from compression fractures  Preparation for the injection:  1. Do not eat any solid food or dairy products within 8 hours of your appointment.  2. You may drink clear liquids up to 3 hours before  appointment.  Clear liquids include water, black coffee, juice or soda.  No milk or cream please. 3. You may take your regular medication, including pain medications, with a sip of water before your appointment  Diabetics should hold regular insulin (if taken separately) and take 1/2 normal NPH dos the morning of the procedure.  Carry some sugar containing items with you to your appointment. 4. A driver must accompany you and be prepared to drive you home after your procedure.  5. Bring all your current medications with your. 6. An IV may be inserted and sedation may be given at the discretion of the physician.   7. A blood pressure cuff, EKG and other monitors will often be applied during the procedure.  Some patients may need  to have extra oxygen administered for a short period. 8. You will be asked to provide medical information, including your allergies, prior to the procedure.  We must know immediately if you are taking blood thinners (like Coumadin/Warfarin)  Or if you are allergic to IV iodine contrast (dye). We must know if you could possible be pregnant.  Possible side-effects:  Bleeding from needle site  Infection (rare, may require surgery)  Nerve injury (rare)  Numbness & tingling (temporary)  Difficulty urinating (rare, temporary)  Spinal headache ( a headache worse with upright posture)  Light -headedness (temporary)  Pain at injection site (several days)  Decreased blood pressure (temporary)  Weakness in arm/leg (temporary)  Pressure sensation in back/neck (temporary)  Call if you experience:  Fever/chills associated with headache or increased back/neck pain.  Headache worsened by an upright position.  New onset weakness or numbness of an extremity below the injection site  Hives or difficulty breathing (go to the emergency room)  Inflammation or drainage at the infection site  Severe back/neck pain  Any new symptoms which are concerning to you  Please  note:  Although the local anesthetic injected can often make your back or neck feel good for several hours after the injection, the pain will likely return.  It takes 3-7 days for steroids to work in the epidural space.  You may not notice any pain relief for at least that one week.  If effective, we will often do a series of three injections spaced 3-6 weeks apart to maximally decrease your pain.  After the initial series, we generally will wait several months before considering a repeat injection of the same type.  If you have any questions, please call [phone] [facility]

## 2018-05-21 NOTE — Patient Instructions (Signed)
GENERAL RISKS AND COMPLICATIONS  What are the risk, side effects and possible complications? Generally speaking, most procedures are safe.  However, with any procedure there are risks, side effects, and the possibility of complications.  The risks and complications are dependent upon the sites that are lesioned, or the type of nerve block to be performed.  The closer the procedure is to the spine, the more serious the risks are.  Great care is taken when placing the radio frequency needles, block needles or lesioning probes, but sometimes complications can occur. 1. Infection: Any time there is an injection through the skin, there is a risk of infection.  This is why sterile conditions are used for these blocks.  There are four possible types of infection. 1. Localized skin infection. 2. Central Nervous System Infection-This can be in the form of Meningitis, which can be deadly. 3. Epidural Infections-This can be in the form of an epidural abscess, which can cause pressure inside of the spine, causing compression of the spinal cord with subsequent paralysis. This would require an emergency surgery to decompress, and there are no guarantees that the patient would recover from the paralysis. 4. Discitis-This is an infection of the intervertebral discs.  It occurs in about 1% of discography procedures.  It is difficult to treat and it may lead to surgery.        2. Pain: the needles have to go through skin and soft tissues, will cause soreness.       3. Damage to internal structures:  The nerves to be lesioned may be near blood vessels or    other nerves which can be potentially damaged.       4. Bleeding: Bleeding is more common if the patient is taking blood thinners such as  aspirin, Coumadin, Ticiid, Plavix, etc., or if he/she have some genetic predisposition  such as hemophilia. Bleeding into the spinal canal can cause compression of the spinal  cord with subsequent paralysis.  This would require an  emergency surgery to  decompress and there are no guarantees that the patient would recover from the  paralysis.       5. Pneumothorax:  Puncturing of a lung is a possibility, every time a needle is introduced in  the area of the chest or upper back.  Pneumothorax refers to free air around the  collapsed lung(s), inside of the thoracic cavity (chest cavity).  Another two possible  complications related to a similar event would include: Hemothorax and Chylothorax.   These are variations of the Pneumothorax, where instead of air around the collapsed  lung(s), you may have blood or chyle, respectively.       6. Spinal headaches: They may occur with any procedures in the area of the spine.       7. Persistent CSF (Cerebro-Spinal Fluid) leakage: This is a rare problem, but may occur  with prolonged intrathecal or epidural catheters either due to the formation of a fistulous  track or a dural tear.       8. Nerve damage: By working so close to the spinal cord, there is always a possibility of  nerve damage, which could be as serious as a permanent spinal cord injury with  paralysis.       9. Death:  Although rare, severe deadly allergic reactions known as "Anaphylactic  reaction" can occur to any of the medications used.      10. Worsening of the symptoms:  We can always make thing worse.    What are the chances of something like this happening? Chances of any of this occuring are extremely low.  By statistics, you have more of a chance of getting killed in a motor vehicle accident: while driving to the hospital than any of the above occurring .  Nevertheless, you should be aware that they are possibilities.  In general, it is similar to taking a shower.  Everybody knows that you can slip, hit your head and get killed.  Does that mean that you should not shower again?  Nevertheless always keep in mind that statistics do not mean anything if you happen to be on the wrong side of them.  Even if a procedure has a 1  (one) in a 1,000,000 (million) chance of going wrong, it you happen to be that one..Also, keep in mind that by statistics, you have more of a chance of having something go wrong when taking medications.  Who should not have this procedure? If you are on a blood thinning medication (e.g. Coumadin, Plavix, see list of "Blood Thinners"), or if you have an active infection going on, you should not have the procedure.  If you are taking any blood thinners, please inform your physician.  How should I prepare for this procedure?  Do not eat or drink anything at least six hours prior to the procedure.  Bring a driver with you .  It cannot be a taxi.  Come accompanied by an adult that can drive you back, and that is strong enough to help you if your legs get weak or numb from the local anesthetic.  Take all of your medicines the morning of the procedure with just enough water to swallow them.  If you have diabetes, make sure that you are scheduled to have your procedure done first thing in the morning, whenever possible.  If you have diabetes, take only half of your insulin dose and notify our nurse that you have done so as soon as you arrive at the clinic.  If you are diabetic, but only take blood sugar pills (oral hypoglycemic), then do not take them on the morning of your procedure.  You may take them after you have had the procedure.  Do not take aspirin or any aspirin-containing medications, at least eleven (11) days prior to the procedure.  They may prolong bleeding.  Wear loose fitting clothing that may be easy to take off and that you would not mind if it got stained with Betadine or blood.  Do not wear any jewelry or perfume  Remove any nail coloring.  It will interfere with some of our monitoring equipment.  NOTE: Remember that this is not meant to be interpreted as a complete list of all possible complications.  Unforeseen problems may occur.  BLOOD THINNERS The following drugs  contain aspirin or other products, which can cause increased bleeding during surgery and should not be taken for 2 weeks prior to and 1 week after surgery.  If you should need take something for relief of minor pain, you may take acetaminophen which is found in Tylenol,m Datril, Anacin-3 and Panadol. It is not blood thinner. The products listed below are.  Do not take any of the products listed below in addition to any listed on your instruction sheet.  A.P.C or A.P.C with Codeine Codeine Phosphate Capsules #3 Ibuprofen Ridaura  ABC compound Congesprin Imuran rimadil  Advil Cope Indocin Robaxisal  Alka-Seltzer Effervescent Pain Reliever and Antacid Coricidin or Coricidin-D  Indomethacin Rufen    Alka-Seltzer plus Cold Medicine Cosprin Ketoprofen S-A-C Tablets  Anacin Analgesic Tablets or Capsules Coumadin Korlgesic Salflex  Anacin Extra Strength Analgesic tablets or capsules CP-2 Tablets Lanoril Salicylate  Anaprox Cuprimine Capsules Levenox Salocol  Anexsia-D Dalteparin Magan Salsalate  Anodynos Darvon compound Magnesium Salicylate Sine-off  Ansaid Dasin Capsules Magsal Sodium Salicylate  Anturane Depen Capsules Marnal Soma  APF Arthritis pain formula Dewitt's Pills Measurin Stanback  Argesic Dia-Gesic Meclofenamic Sulfinpyrazone  Arthritis Bayer Timed Release Aspirin Diclofenac Meclomen Sulindac  Arthritis pain formula Anacin Dicumarol Medipren Supac  Analgesic (Safety coated) Arthralgen Diffunasal Mefanamic Suprofen  Arthritis Strength Bufferin Dihydrocodeine Mepro Compound Suprol  Arthropan liquid Dopirydamole Methcarbomol with Aspirin Synalgos  ASA tablets/Enseals Disalcid Micrainin Tagament  Ascriptin Doan's Midol Talwin  Ascriptin A/D Dolene Mobidin Tanderil  Ascriptin Extra Strength Dolobid Moblgesic Ticlid  Ascriptin with Codeine Doloprin or Doloprin with Codeine Momentum Tolectin  Asperbuf Duoprin Mono-gesic Trendar  Aspergum Duradyne Motrin or Motrin IB Triminicin  Aspirin  plain, buffered or enteric coated Durasal Myochrisine Trigesic  Aspirin Suppositories Easprin Nalfon Trillsate  Aspirin with Codeine Ecotrin Regular or Extra Strength Naprosyn Uracel  Atromid-S Efficin Naproxen Ursinus  Auranofin Capsules Elmiron Neocylate Vanquish  Axotal Emagrin Norgesic Verin  Azathioprine Empirin or Empirin with Codeine Normiflo Vitamin E  Azolid Emprazil Nuprin Voltaren  Bayer Aspirin plain, buffered or children's or timed BC Tablets or powders Encaprin Orgaran Warfarin Sodium  Buff-a-Comp Enoxaparin Orudis Zorpin  Buff-a-Comp with Codeine Equegesic Os-Cal-Gesic   Buffaprin Excedrin plain, buffered or Extra Strength Oxalid   Bufferin Arthritis Strength Feldene Oxphenbutazone   Bufferin plain or Extra Strength Feldene Capsules Oxycodone with Aspirin   Bufferin with Codeine Fenoprofen Fenoprofen Pabalate or Pabalate-SF   Buffets II Flogesic Panagesic   Buffinol plain or Extra Strength Florinal or Florinal with Codeine Panwarfarin   Buf-Tabs Flurbiprofen Penicillamine   Butalbital Compound Four-way cold tablets Penicillin   Butazolidin Fragmin Pepto-Bismol   Carbenicillin Geminisyn Percodan   Carna Arthritis Reliever Geopen Persantine   Carprofen Gold's salt Persistin   Chloramphenicol Goody's Phenylbutazone   Chloromycetin Haltrain Piroxlcam   Clmetidine heparin Plaquenil   Cllnoril Hyco-pap Ponstel   Clofibrate Hydroxy chloroquine Propoxyphen         Before stopping any of these medications, be sure to consult the physician who ordered them.  Some, such as Coumadin (Warfarin) are ordered to prevent or treat serious conditions such as "deep thrombosis", "pumonary embolisms", and other heart problems.  The amount of time that you may need off of the medication may also vary with the medication and the reason for which you were taking it.  If you are taking any of these medications, please make sure you notify your pain physician before you undergo any  procedures.         Moderate Conscious Sedation, Adult Sedation is the use of medicines to promote relaxation and relieve discomfort and anxiety. Moderate conscious sedation is a type of sedation. Under moderate conscious sedation, you are less alert than normal, but you are still able to respond to instructions, touch, or both. Moderate conscious sedation is used during short medical and dental procedures. It is milder than deep sedation, which is a type of sedation under which you cannot be easily woken up. It is also milder than general anesthesia, which is the use of medicines to make you unconscious. Moderate conscious sedation allows you to return to your regular activities sooner. Tell a health care provider about:  Any allergies you have.  All medicines you are  taking, including vitamins, herbs, eye drops, creams, and over-the-counter medicines.  Use of steroids (by mouth or creams).  Any problems you or family members have had with sedatives and anesthetic medicines.  Any blood disorders you have.  Any surgeries you have had.  Any medical conditions you have, such as sleep apnea.  Whether you are pregnant or may be pregnant.  Any use of cigarettes, alcohol, marijuana, or street drugs. What are the risks? Generally, this is a safe procedure. However, problems may occur, including:  Getting too much medicine (oversedation).  Nausea.  Allergic reaction to medicines.  Trouble breathing. If this happens, a breathing tube may be used to help with breathing. It will be removed when you are awake and breathing on your own.  Heart trouble.  Lung trouble.  What happens before the procedure? Staying hydrated Follow instructions from your health care provider about hydration, which may include:  Up to 2 hours before the procedure - you may continue to drink clear liquids, such as water, clear fruit juice, black coffee, and plain tea.  Eating and drinking  restrictions Follow instructions from your health care provider about eating and drinking, which may include:  8 hours before the procedure - stop eating heavy meals or foods such as meat, fried foods, or fatty foods.  6 hours before the procedure - stop eating light meals or foods, such as toast or cereal.  6 hours before the procedure - stop drinking milk or drinks that contain milk.  2 hours before the procedure - stop drinking clear liquids.  Medicine  Ask your health care provider about:  Changing or stopping your regular medicines. This is especially important if you are taking diabetes medicines or blood thinners.  Taking medicines such as aspirin and ibuprofen. These medicines can thin your blood. Do not take these medicines before your procedure if your health care provider instructs you not to.  Tests and exams  You will have a physical exam.  You may have blood tests done to show: ? How well your kidneys and liver are working. ? How well your blood can clot. General instructions  Plan to have someone take you home from the hospital or clinic.  If you will be going home right after the procedure, plan to have someone with you for 24 hours. What happens during the procedure?  An IV tube will be inserted into one of your veins.  Medicine to help you relax (sedative) will be given through the IV tube.  The medical or dental procedure will be performed. What happens after the procedure?  Your blood pressure, heart rate, breathing rate, and blood oxygen level will be monitored often until the medicines you were given have worn off.  Do not drive for 24 hours. This information is not intended to replace advice given to you by your health care provider. Make sure you discuss any questions you have with your health care provider. Document Released: 04/30/2001 Document Revised: 01/09/2016 Document Reviewed: 11/25/2015 Elsevier Interactive Patient Education  2018  [place]. Epidural Steroid Injection Patient Information  Description: The epidural space surrounds the nerves as they exit the spinal cord.  In some patients, the nerves can be compressed and inflamed by a bulging disc or a tight spinal canal (spinal stenosis).  By injecting steroids into the epidural space, we can bring irritated nerves into direct contact with a potentially helpful medication.  These steroids act directly on the irritated nerves and can reduce swelling and inflammation  which often leads to decreased pain.  Epidural steroids may be injected anywhere along the spine and from the neck to the low back depending upon the location of your pain.   After numbing the skin with local anesthetic (like Novocaine), a small needle is passed into the epidural space slowly.  You may experience a sensation of pressure while this is being done.  The entire block usually last less than 10 minutes.  Conditions which may be treated by epidural steroids:   Low back and leg pain  Neck and arm pain  Spinal stenosis  Post-laminectomy syndrome  Herpes zoster (shingles) pain  Pain from compression fractures  Preparation for the injection:  1. Do not eat any solid food or dairy products within 8 hours of your appointment.  2. You may drink clear liquids up to 3 hours before appointment.  Clear liquids include water, black coffee, juice or soda.  No milk or cream please. 3. You may take your regular medication, including pain medications, with a sip of water before your appointment  Diabetics should hold regular insulin (if taken separately) and take 1/2 normal NPH dos the morning of the procedure.  Carry some sugar containing items with you to your appointment. 4. A driver must accompany you and be prepared to drive you home after your procedure.  5. Bring all your current medications with your. 6. An IV may be inserted and sedation may be given at the discretion of the physician.   7. A  blood pressure cuff, EKG and other monitors will often be applied during the procedure.  Some patients may need to have extra oxygen administered for a short period. 8. You will be asked to provide medical information, including your allergies, prior to the procedure.  We must know immediately if you are taking blood thinners (like Coumadin/Warfarin)  Or if you are allergic to IV iodine contrast (dye). We must know if you could possible be pregnant.  Possible side-effects:  Bleeding from needle site  Infection (rare, may require surgery)  Nerve injury (rare)  Numbness & tingling (temporary)  Difficulty urinating (rare, temporary)  Spinal headache ( a headache worse with upright posture)  Light -headedness (temporary)  Pain at injection site (several days)  Decreased blood pressure (temporary)  Weakness in arm/leg (temporary)  Pressure sensation in back/neck (temporary)  Call if you experience:  Fever/chills associated with headache or increased back/neck pain.  Headache worsened by an upright position.  New onset weakness or numbness of an extremity below the injection site  Hives or difficulty breathing (go to the emergency room)  Inflammation or drainage at the infection site  Severe back/neck pain  Any new symptoms which are concerning to you  Please note:  Although the local anesthetic injected can often make your back or neck feel good for several hours after the injection, the pain will likely return.  It takes 3-7 days for steroids to work in the epidural space.  You may not notice any pain relief for at least that one week.  If effective, we will often do a series of three injections spaced 3-6 weeks apart to maximally decrease your pain.  After the initial series, we generally will wait several months before considering a repeat injection of the same type.  If you have any questions, please call [phone] [facility]

## 2018-05-25 ENCOUNTER — Ambulatory Visit (INDEPENDENT_AMBULATORY_CARE_PROVIDER_SITE_OTHER): Payer: PPO | Admitting: Pharmacist

## 2018-05-25 ENCOUNTER — Encounter: Payer: Self-pay | Admitting: Pharmacist

## 2018-05-25 DIAGNOSIS — I10 Essential (primary) hypertension: Secondary | ICD-10-CM

## 2018-05-25 NOTE — Progress Notes (Signed)
S:    Patient arrives in good spirits, ambulating without assistance.  Presents to the clinic for hypertension evaluation, counseling, and management. Patient was referred by Dr. Nicki Reaper.  Patient was last seen by Primary Care Provider on 04/14/2018. He has a PMH significant for HTN, CAD, aflutter s/  Recent history: He had an ED visit on 02/18/2018 for syncope with SBP down to 50s. He was given fluids and held amlodipine, continued losartan 100 mg and metoprolol 50 mg BID. In the interim, he has tried different combinations of losartan, amlodipine, and metoprolol, and it is difficult to parse out through chart review which combination controlled BP sufficiently without episodes of syncope or pre-syncope.   Today, he notes that he has had 3 episodes of "almost passing out" in the past week, which appears to be more frequent than previous. He endorses significant weakness, and his wife notes that he profusely sweats and has a hard time carrying on a conversation. He notes that he is often afraid to drive d/t these episodes. He denies chest pain, pressure, radiating sensations to his arms or jaw, dizziness, or lightheadedness during these episodes. He cannot identify any aggravating factors or remitting factors besides resting. He had an episode last week while leaving the pain clinic where he laid in his truck for ~30 minutes until he felt better to drive home.  Patient reports adherence with current medication regiment.  Current BP Medications include:  Metoprolol tartrate 50 mg BID, amlodipine 10 mg daily.  Home BP readings: On this regimen, typically 100-110s/60s  O:  Physical Exam  Constitutional: He appears well-developed and well-nourished.   Review of Systems  All other systems reviewed and are negative.  Last 3 Office BP readings: BP Readings from Last 3 Encounters:  05/25/18 117/73  05/21/18 108/66  04/29/18 (!) 173/90    BMET    Component Value Date/Time   NA 129 (L) 02/18/2018  1043   NA 130 (L) 01/23/2013 0447   K 4.5 02/18/2018 1043   K 4.4 01/23/2013 0447   CL 100 02/18/2018 1043   CL 100 01/23/2013 0447   CO2 21 (L) 02/18/2018 1043   CO2 23 01/23/2013 0447   GLUCOSE 102 (H) 02/18/2018 1043   GLUCOSE 91 01/23/2013 0447   BUN 18 02/18/2018 1043   BUN 13 01/23/2013 0447   CREATININE 1.18 02/18/2018 1043   CREATININE 1.15 04/22/2014 1115   CALCIUM 9.2 02/18/2018 1043   CALCIUM 8.6 01/23/2013 0447   GFRNONAA 59 (L) 02/18/2018 1043   GFRNONAA >60 04/22/2014 1115   GFRAA >60 02/18/2018 1043   GFRAA >60 04/22/2014 1115    Renal function: CrCl cannot be calculated (Patient's most recent lab result is older than the maximum 21 days allowed.).  A/P:  Following discussion and approval by Dr. Derrel Nip, the following medication changes were made:   Hypertension longstanding currently at goal on current medications, but with episodes of pre-syncopal weakness. BP Goal <130/80 mmHg, though may be appropriate to target a less stringent goal d/t hx syncope. Patient has a history of self-adjusting antihypertensives based on home readings - D/t increasing frequency of weakness and sweating, as well as hx arrhythmia issues, recommended patient f/u with cardiology as soon as possible. - For now, continue amlodipine 10 mg QAM and metoprolol 50 mg BID.  - Patient has not checked BP in the afternoon/evenings. Moving forward, may be appropriate to reduce amlodipine morning dose to 5 mg and add back low dose losartan 25-50 mg in the  evenings, to better balance BP control throughout the day    Results reviewed and written information provided.   Total time in face-to-face counseling 45 minutes.   F/U Clinic Visit TBD pending cardiology f/u.   Catie [name], PharmD PGY2 Ambulatory Care Pharmacy Resident, [facility] Phone: [phone]

## 2018-05-25 NOTE — Assessment & Plan Note (Signed)
Hypertension longstanding currently at goal on current medications, but with episodes of pre-syncopal weakness. BP Goal <130/80 mmHg, though may be appropriate to target a less stringent goal d/t hx syncope. Patient has a history of self-adjusting antihypertensives based on home readings - D/t increasing frequency of weakness and sweating, as well as hx arrhythmia issues, recommended patient f/u with cardiology as soon as possible. - For now, continue amlodipine 10 mg QAM and metoprolol 50 mg BID.  - Patient has not checked BP in the afternoon/evenings. Moving forward, may be appropriate to reduce amlodipine morning dose to 5 mg and add back low dose losartan 25-50 mg in the evenings, to better balance BP control throughout the day

## 2018-05-25 NOTE — Patient Instructions (Addendum)
It was great to see you today!  Because of these episodes of weakness/sweating that have been worse lately, we recommend that you schedule follow up with Dr. Saralyn Pilar. I will send a message over to his office. You may need a monitor to see what is happening.   Continue amlodipine 10 mg daily and metoprolol 50 mg twice daily for now. Continue to hold losartan until cardiology gives you further instructions.  Try checking blood pressure at different times throughout the day - check sometimes in the morning and sometimes in the evening.    We'll wait and see what Dr. Saralyn Pilar has to say before we make a follow up plan. I'll be in touch as soon as I hear something about the Eliquis patient assistance.   Feel free to reach out to the clinic with any questions or concerns!   Catie [name], PharmD

## 2018-05-27 ENCOUNTER — Inpatient Hospital Stay: Payer: PPO | Attending: Oncology

## 2018-05-27 DIAGNOSIS — D509 Iron deficiency anemia, unspecified: Secondary | ICD-10-CM | POA: Diagnosis not present

## 2018-05-27 DIAGNOSIS — Z79899 Other long term (current) drug therapy: Secondary | ICD-10-CM | POA: Diagnosis not present

## 2018-05-27 DIAGNOSIS — C880 Waldenstrom macroglobulinemia: Secondary | ICD-10-CM

## 2018-05-27 LAB — CBC WITH DIFFERENTIAL/PLATELET
Abs Immature Granulocytes: 0.02 10*3/uL (ref 0.00–0.07)
BASOS PCT: 1 %
Basophils Absolute: 0.1 10*3/uL (ref 0.0–0.1)
EOS ABS: 0 10*3/uL (ref 0.0–0.5)
Eosinophils Relative: 1 %
HCT: 27 % — ABNORMAL LOW (ref 39.0–52.0)
Hemoglobin: 8.7 g/dL — ABNORMAL LOW (ref 13.0–17.0)
IMMATURE GRANULOCYTES: 0 %
Lymphocytes Relative: 22 %
Lymphs Abs: 1.3 10*3/uL (ref 0.7–4.0)
MCH: 32.6 pg (ref 26.0–34.0)
MCHC: 32.2 g/dL (ref 30.0–36.0)
MCV: 101.1 fL — AB (ref 80.0–100.0)
MONOS PCT: 13 %
Monocytes Absolute: 0.8 10*3/uL (ref 0.1–1.0)
NEUTROS ABS: 3.8 10*3/uL (ref 1.7–7.7)
NEUTROS PCT: 63 %
Platelets: 364 10*3/uL (ref 150–400)
RBC: 2.67 MIL/uL — AB (ref 4.22–5.81)
RDW: 14.9 % (ref 11.5–15.5)
WBC: 6 10*3/uL (ref 4.0–10.5)
nRBC: 0 % (ref 0.0–0.2)

## 2018-05-27 LAB — IRON AND TIBC
IRON: 68 ug/dL (ref 45–182)
Saturation Ratios: 6 % — ABNORMAL LOW (ref 17.9–39.5)
TIBC: 1089 ug/dL — ABNORMAL HIGH (ref 250–450)
UIBC: 1021 ug/dL

## 2018-05-27 LAB — FERRITIN: Ferritin: 14 ng/mL — ABNORMAL LOW (ref 24–336)

## 2018-05-28 LAB — PROTEIN ELECTROPHORESIS, SERUM
A/G RATIO SPE: 0.7 (ref 0.7–1.7)
Albumin ELP: 3.3 g/dL (ref 2.9–4.4)
Alpha-1-Globulin: 0.3 g/dL (ref 0.0–0.4)
Alpha-2-Globulin: 0.6 g/dL (ref 0.4–1.0)
Beta Globulin: 1.7 g/dL — ABNORMAL HIGH (ref 0.7–1.3)
GLOBULIN, TOTAL: 4.6 g/dL — AB (ref 2.2–3.9)
Gamma Globulin: 2.1 g/dL — ABNORMAL HIGH (ref 0.4–1.8)
M-Spike, %: 0.9 g/dL — ABNORMAL HIGH
TOTAL PROTEIN ELP: 7.9 g/dL (ref 6.0–8.5)

## 2018-05-28 LAB — KAPPA/LAMBDA LIGHT CHAINS
KAPPA, LAMDA LIGHT CHAIN RATIO: 4.99 — AB (ref 0.26–1.65)
Kappa free light chain: 68.9 mg/L — ABNORMAL HIGH (ref 3.3–19.4)
Lambda free light chains: 13.8 mg/L (ref 5.7–26.3)

## 2018-05-28 LAB — IGG, IGA, IGM
IGG (IMMUNOGLOBIN G), SERUM: 486 mg/dL — AB (ref 700–1600)
IgA: 37 mg/dL — ABNORMAL LOW (ref 61–437)
IgM (Immunoglobulin M), Srm: 1745 mg/dL — ABNORMAL HIGH (ref 15–143)

## 2018-05-29 ENCOUNTER — Other Ambulatory Visit: Payer: Self-pay | Admitting: Pharmacist

## 2018-05-29 NOTE — Patient Outreach (Addendum)
[facility]) Care Management  05/29/2018  Troy Lopez 01-30-1941 [id number]  [name]- patient has been approved for Eliquis patient assistance through 08/18/2018. They noted it should ships in 7-10 business days.   Approved for Ventolin through [place] through 08/18/2018 as well.   Contacted patient, informed of approval. He expressed significant appreciation; noted that he received a call from [employer] yesterday to confirm shipping address and that the medication should ship today.   Encouraged patient to schedule f/u with cardiology for "weakness/sweating" spells as per our appointment on 05/25/2018. He notes that he has not done this yet, but agrees that he will.   Catie [name], PharmD PGY2 Ambulatory Care Pharmacy Resident, [facility] Phone: [phone]

## 2018-05-31 NOTE — Progress Notes (Signed)
  I have reviewed the above information and agree with above.   Jesenya Bowditch, MD 

## 2018-06-03 ENCOUNTER — Inpatient Hospital Stay: Payer: PPO

## 2018-06-03 VITALS — BP 163/88 | HR 61 | Temp 97.5°F | Resp 18

## 2018-06-03 DIAGNOSIS — D509 Iron deficiency anemia, unspecified: Secondary | ICD-10-CM

## 2018-06-03 MED ORDER — SODIUM CHLORIDE 0.9 % IV SOLN
510.0000 mg | Freq: Once | INTRAVENOUS | Status: AC
  Administered 2018-06-03: 510 mg via INTRAVENOUS
  Filled 2018-06-03: qty 17

## 2018-06-03 MED ORDER — SODIUM CHLORIDE 0.9 % IV SOLN
Freq: Once | INTRAVENOUS | Status: AC
  Administered 2018-06-03: 10:00:00 via INTRAVENOUS
  Filled 2018-06-03: qty 250

## 2018-06-03 NOTE — Patient Instructions (Signed)

## 2018-06-09 ENCOUNTER — Other Ambulatory Visit
Admission: RE | Admit: 2018-06-09 | Discharge: 2018-06-09 | Disposition: A | Discharge status: Home / Self Care | Payer: PPO | Source: Ambulatory Visit | Attending: Internal Medicine | Admitting: Internal Medicine

## 2018-06-09 ENCOUNTER — Ambulatory Visit (INDEPENDENT_AMBULATORY_CARE_PROVIDER_SITE_OTHER): Payer: PPO

## 2018-06-09 ENCOUNTER — Encounter: Payer: Self-pay | Admitting: Internal Medicine

## 2018-06-09 ENCOUNTER — Ambulatory Visit (INDEPENDENT_AMBULATORY_CARE_PROVIDER_SITE_OTHER): Payer: PPO | Admitting: Internal Medicine

## 2018-06-09 ENCOUNTER — Other Ambulatory Visit: Payer: Self-pay | Admitting: Radiology

## 2018-06-09 VITALS — BP 138/78 | HR 59 | Temp 98.2°F | Resp 18 | Wt 192.2 lb

## 2018-06-09 DIAGNOSIS — E871 Hypo-osmolality and hyponatremia: Secondary | ICD-10-CM

## 2018-06-09 DIAGNOSIS — R0602 Shortness of breath: Secondary | ICD-10-CM | POA: Diagnosis not present

## 2018-06-09 DIAGNOSIS — D509 Iron deficiency anemia, unspecified: Secondary | ICD-10-CM | POA: Diagnosis not present

## 2018-06-09 DIAGNOSIS — D649 Anemia, unspecified: Secondary | ICD-10-CM

## 2018-06-09 DIAGNOSIS — J449 Chronic obstructive pulmonary disease, unspecified: Secondary | ICD-10-CM

## 2018-06-09 DIAGNOSIS — I10 Essential (primary) hypertension: Secondary | ICD-10-CM

## 2018-06-09 DIAGNOSIS — I4892 Unspecified atrial flutter: Secondary | ICD-10-CM | POA: Diagnosis not present

## 2018-06-09 DIAGNOSIS — I2510 Atherosclerotic heart disease of native coronary artery without angina pectoris: Secondary | ICD-10-CM | POA: Diagnosis not present

## 2018-06-09 DIAGNOSIS — K219 Gastro-esophageal reflux disease without esophagitis: Secondary | ICD-10-CM

## 2018-06-09 DIAGNOSIS — E785 Hyperlipidemia, unspecified: Secondary | ICD-10-CM | POA: Diagnosis not present

## 2018-06-09 DIAGNOSIS — I272 Pulmonary hypertension, unspecified: Secondary | ICD-10-CM | POA: Diagnosis not present

## 2018-06-09 LAB — CBC WITH DIFFERENTIAL/PLATELET
ABS IMMATURE GRANULOCYTES: 0.05 10*3/uL (ref 0.00–0.07)
Basophils Absolute: 0.1 10*3/uL (ref 0.0–0.1)
Basophils Relative: 1 %
EOS PCT: 0 %
Eosinophils Absolute: 0 10*3/uL (ref 0.0–0.5)
HCT: 28 % — ABNORMAL LOW (ref 39.0–52.0)
HEMOGLOBIN: 8.9 g/dL — AB (ref 13.0–17.0)
Immature Granulocytes: 1 %
LYMPHS PCT: 23 %
Lymphs Abs: 1.8 10*3/uL (ref 0.7–4.0)
MCH: 33 pg (ref 26.0–34.0)
MCHC: 31.8 g/dL (ref 30.0–36.0)
MCV: 103.7 fL — AB (ref 80.0–100.0)
MONO ABS: 0.7 10*3/uL (ref 0.1–1.0)
Monocytes Relative: 9 %
NEUTROS ABS: 5.2 10*3/uL (ref 1.7–7.7)
Neutrophils Relative %: 66 %
Platelets: 367 10*3/uL (ref 150–400)
RBC: 2.7 MIL/uL — AB (ref 4.22–5.81)
RDW: 16.7 % — ABNORMAL HIGH (ref 11.5–15.5)
WBC: 7.8 10*3/uL (ref 4.0–10.5)
nRBC: 0 % (ref 0.0–0.2)

## 2018-06-09 LAB — BASIC METABOLIC PANEL
ANION GAP: 9 (ref 5–15)
BUN: 12 mg/dL (ref 8–23)
CHLORIDE: 100 mmol/L (ref 98–111)
CO2: 22 mmol/L (ref 22–32)
Calcium: 9.2 mg/dL (ref 8.9–10.3)
Creatinine, Ser: 1.22 mg/dL (ref 0.61–1.24)
GFR calc non Af Amer: 56 mL/min — ABNORMAL LOW (ref >60)
Glucose, Bld: 159 mg/dL — ABNORMAL HIGH (ref 70–99)
POTASSIUM: 4.3 mmol/L (ref 3.5–5.1)
Sodium: 131 mmol/L — ABNORMAL LOW (ref 135–145)

## 2018-06-09 LAB — TROPONIN I: Troponin I: <0.03 ng/mL (ref <0.03)

## 2018-06-09 LAB — FERRITIN: Ferritin: 219 ng/mL (ref 24–336)

## 2018-06-09 MED ORDER — PREDNISONE 10 MG PO TABS: ORAL_TABLET | ORAL | 0 refills | Status: DC

## 2018-06-09 NOTE — Progress Notes (Signed)
Patient ID: Troy Lopez, male   DOB: May 11, 1941, 77 y.o.   MRN: [id number]   Subjective:    Patient ID: Troy Lopez, male    DOB: 1941/07/05, 77 y.o.   MRN: [id number]  HPI  Patient here for a scheduled follow up.  He is accompanied by his wife.  History obtained from both of them.  [place]  Noticed decreased energy and sob.  States had trouble breathing.  This worsened the following day and wife reports noticing some wheezing.  Told his wife he wanted to go to the ER.  Rested and they were going to drive home for him to be evaluated, but he started feeling a little better and was not evaluated.  The following morning, they drove home and he noticed on the drive home, he felt like he was going to pass out.  Wife driving.  When home, had to hold on to various objects to get in the house.  Hard to pick up his feet.  Increased sob.  No chest pain reported.  Wife reports felt clammy at times.  Weak.  Some cough - mostly non productive.  Decreased appetite.  No vomiting.  No abdominal pain.  States breathing is worse in am and after dark.  Is followed by cardiology.  Has appt in 06/2018 for f/u.  Seeing Dr Alva Garnet.  Using [facility] and rescue inhaler.  Also, seeing hematology for decreased hgb.  Received iron infusion 06/03/18.  Significant decrease in hgb recently (11.4 down to 8.7).  Increased weight.  Started back on lasix a few days ago.     Past Medical History:  Diagnosis Date  . Adrenal gland anomaly    enlargement  . Anxiety   . CAD (coronary artery disease)   . Carpal tunnel syndrome   . Chronic hyponatremia   . Colitis   . Colonic polyp   . COPD (chronic obstructive pulmonary disease) ([place])   . Degenerative disc disease, lumbar   . Depression   . Diverticulosis   . Dyspnea   . GERD (gastroesophageal reflux disease)   . H/O degenerative disc disease   . Hypercholesterolemia   . Hyperkalemia   . Hyperlipidemia   . Hypertension   . Irritable  bowel syndrome   . Monoclonal gammopathy   . Monoclonal gammopathy   . Neuropathy   . Personal history of tobacco use, presenting hazards to health 08/17/2015  . Sleep apnea   . Vertebral compression fracture [facility])    Past Surgical History:  Procedure Laterality Date  . BUNIONECTOMY  1989  . CARPAL TUNNEL RELEASE Left 2011   ulnar nerve sub muscular at elbow  . CHOLECYSTECTOMY  09/07  . COLONOSCOPY WITH PROPOFOL N/A 03/05/2017   Procedure: COLONOSCOPY WITH PROPOFOL;  Surgeon: Lucilla Lame, MD;  Location: [facility] ENDOSCOPY;  Service: Endoscopy;  Laterality: N/A;  . ELECTROPHYSIOLOGIC STUDY N/A 11/15/2015   Procedure: CARDIOVERSION;  Surgeon: Isaias Cowman, MD;  Location: [facility] ORS;  Service: Cardiovascular;  Laterality: N/A;  . ESOPHAGOGASTRODUODENOSCOPY (EGD) WITH PROPOFOL N/A 03/05/2017   Procedure: ESOPHAGOGASTRODUODENOSCOPY (EGD) WITH PROPOFOL;  Surgeon: Lucilla Lame, MD;  Location: [facility] ENDOSCOPY;  Service: Endoscopy;  Laterality: N/A;  . ESOPHAGOGASTRODUODENOSCOPY (EGD) WITH PROPOFOL N/A 10/17/2017   Procedure: ESOPHAGOGASTRODUODENOSCOPY (EGD) WITH PROPOFOL;  Surgeon: Lollie Sails, MD;  Location: [facility] ENDOSCOPY;  Service: Endoscopy;  Laterality: N/A;  . EYE SURGERY Bilateral 2010   cataract  . HEMORRHOID SURGERY    . KYPHOPLASTY  N/A 11/04/2016   Procedure: KYPHOPLASTY T 12;  Surgeon: Hessie Knows, MD;  Location: [facility] ORS;  Service: Orthopedics;  Laterality: N/A;   Family History  Problem Relation Age of Onset  . Stroke Mother   . Heart disease Father        MI - 8   . Colon cancer Neg Hx   . Prostate cancer Neg Hx    Social History   Socioeconomic History  . Marital status: Married    Spouse name: Not on file  . Number of children: 3  . Years of education: Not on file  . Highest education level: Not on file  Occupational History  . Not on file  Social Needs  . Financial resource strain: Not on file  . Food insecurity:    Worry: Not on file    Inability: Not  on file  . Transportation needs:    Medical: Not on file    Non-medical: Not on file  Tobacco Use  . Smoking status: Former Smoker    Types: Cigarettes    Last attempt to quit: 11/01/2015    Years since quitting: 2.6  . Smokeless tobacco: Never Used  . Tobacco comment: about 2 cigarettes per day, trying to quit  Substance and Sexual Activity  . Alcohol use: Yes    Alcohol/week: 42.0 standard drinks    Types: 42 Cans of beer per week    Comment: occas  . Drug use: No  . Sexual activity: Not on file  Lifestyle  . Physical activity:    Days per week: Not on file    Minutes per session: Not on file  . Stress: Not on file  Relationships  . Social connections:    Talks on phone: Not on file    Gets together: Not on file    Attends religious service: Not on file    Active member of club or organization: Not on file    Attends meetings of clubs or organizations: Not on file    Relationship status: Not on file  Other Topics Concern  . Not on file  Social History Narrative  . Not on file    Outpatient Encounter Medications as of 06/09/2018  Medication Sig  . acetaminophen (TYLENOL) 500 MG tablet Take 500-1,000 mg by mouth 3 (three) times daily as needed for moderate pain or headache.  . albuterol (PROVENTIL HFA;VENTOLIN HFA) 108 (90 Base) MCG/ACT inhaler Inhale 2 puffs into the lungs every 6 (six) hours as needed for wheezing or shortness of breath. (Patient not taking: Reported on 05/25/2018)  . alendronate (FOSAMAX) 70 MG tablet Take 70 mg by mouth once a week. Take with a full glass of water on an empty stomach. Takes on Sundays  . amLODipine (NORVASC) 10 MG tablet Take 10 mg by mouth daily.  Marland Kitchen azelastine (ASTELIN) 0.1 % nasal spray Place 1 spray into both nostrils 2 (two) times daily. Use in each nostril as directed (Patient not taking: Reported on 05/25/2018)  . budesonide (ENTOCORT EC) 3 MG 24 hr capsule Take 9 mg by mouth daily as needed (colitis flare).   . calcium-vitamin D  (OSCAL WITH D) 500-200 MG-UNIT tablet Take 2 tablets by mouth daily.  Marland Kitchen docusate sodium (COLACE) 100 MG capsule   . ELIQUIS 5 MG TABS tablet Take 5 mg by mouth 2 (two) times daily.   Marland Kitchen FIBER PO Take 2 capsules by mouth daily.  . fluticasone (FLONASE) 50 MCG/ACT nasal spray Place 2 sprays into both nostrils  daily. (Patient not taking: Reported on 05/25/2018)  . fluticasone furoate-vilanterol (BREO ELLIPTA) 100-25 MCG/INH AEPB Inhale 1 puff into the lungs daily.  . furosemide (LASIX) 20 MG tablet Take 1 tablet (20 mg total) by mouth daily as needed. (Patient not taking: Reported on 05/25/2018)  . gabapentin (NEURONTIN) 100 MG capsule Take 4 capsules (400 mg) daily in the morning and midday. Take 8 capsules (800 mg) daily in the evening.  . isosorbide mononitrate (IMDUR) 30 MG 24 hr tablet Take 1 tablet (30 mg total) by mouth once daily.  . metoprolol tartrate (LOPRESSOR) 100 MG tablet Take 1 tablet (100 mg total) by mouth 2 (two) times daily.  . pantoprazole (PROTONIX) 40 MG tablet Take 1 tablet (40 mg total) by mouth 2 (two) times daily.  . predniSONE (DELTASONE) 10 MG tablet Take 4 tablets x 1 day and then decrease by 1/2 tablet per day until down to zero mg.  . promethazine (PHENERGAN) 12.5 MG tablet Take 12.5 mg by mouth every 6 (six) hours as needed for nausea or vomiting.  . sertraline (ZOLOFT) 50 MG tablet Take 1 tablet (50 mg total) by mouth daily.  . vitamin B-12 (CYANOCOBALAMIN) 1000 MCG tablet Take 1,000 mcg by mouth daily.   . [DISCONTINUED] losartan (COZAAR) 100 MG tablet Take 1 tablet (100 mg total) by mouth daily. (Patient not taking: Reported on 05/13/2018)   No facility-administered encounter medications on file as of 06/09/2018.     Review of Systems  Constitutional: Positive for fatigue. Negative for fever.       Weight gain.  Decreased appetite.    HENT: Negative for sinus pressure and sore throat.   Respiratory: Positive for cough, shortness of breath and wheezing. Negative  for chest tightness.   Cardiovascular: Negative for palpitations and leg swelling.  Gastrointestinal: Negative for abdominal pain, nausea and vomiting.  Genitourinary: Negative for difficulty urinating and dysuria.  Musculoskeletal: Negative for joint swelling and myalgias.  Skin: Negative for color change and rash.  Neurological: Negative for dizziness, light-headedness and headaches.  Psychiatric/Behavioral: Negative for agitation and dysphoric mood.       Objective:    Physical Exam  Constitutional: He appears well-developed and well-nourished.  Appears not to feel well.    HENT:  Nose: Nose normal.  Mouth/Throat: Oropharynx is clear and moist.  Neck: Neck supple.  Cardiovascular: Normal rate and regular rhythm.  Pulmonary/Chest: Effort normal. No respiratory distress.  No wheezing.  Some diminished breath sounds.    Abdominal: Soft. Bowel sounds are normal. There is no tenderness.  Musculoskeletal:  Increased pedal and ankle edema.    Lymphadenopathy:    He has no cervical adenopathy.  Skin: No rash noted. No erythema.  Psychiatric: He has a normal mood and affect. His behavior is normal.    BP 138/78 (BP Location: Left Arm, Patient Position: Sitting, Cuff Size: Normal)   Pulse (!) 59   Temp 98.2 F (36.8 C) (Oral)   Resp 18   Wt 192 lb 3.2 oz (87.2 kg)   SpO2 95%   BMI 26.81 kg/m  Wt Readings from Last 3 Encounters:  06/09/18 192 lb 3.2 oz (87.2 kg)  05/21/18 186 lb (84.4 kg)  04/29/18 185 lb (83.9 kg)     Lab Results  Component Value Date   WBC 7.8 06/09/2018   HGB 8.9 (L) 06/09/2018   HCT 28.0 (L) 06/09/2018   PLT 367 06/09/2018   GLUCOSE 159 (H) 06/09/2018   CHOL 148 12/17/2017   TRIG 66.0  12/17/2017   HDL 73.90 12/17/2017   LDLCALC 61 12/17/2017   ALT 11 02/18/2018   AST 21 02/18/2018   NA 131 (L) 06/09/2018   K 4.3 06/09/2018   CL 100 06/09/2018   CREATININE 1.22 06/09/2018   BUN 12 06/09/2018   CO2 22 06/09/2018   TSH 1.75 08/18/2017    PSA 1.79 12/17/2017   INR 1.11 01/08/2017   HGBA1C 5.9 12/17/2017   MICROALBUR 1.9 10/27/2017    Dg C-arm 1-60 Min-no Report  Result Date: 04/29/2018 Fluoroscopy was utilized by the requesting physician.  No radiographic interpretation.       Assessment & Plan:   Problem List Items Addressed This Visit    Atrial flutter [facility])    S/p cardioversion.  On eliquis.  Followed by cardiology.        CAD (coronary artery disease)    Followed by cardiology.  With recent sob and increased edema, etc, concern over possible cardiac etiology.  EKG - SR/SB with ventricular rate 58 with RBB.  Discussed evaluation in ER.  He declines.  Will check troponin and cxr.  He recently started back on lasix.  On imdur and eliquis.  Called cardiology.  appt with Dr Saralyn Pilar tomorrow.        GERD (gastroesophageal reflux disease)    Controlled on current regimen.        Hyperlipemia    Low cholesterol diet and exercise.  Not on statin.  Follow lipid panel.        Hypertension    Not orthostatic on exam.  Continue current medication regimen. Check metabolic panel.  He just started back on lasix.        Hyponatremia    Has been worked up extensively.  Has seen nephrology.  Recheck metabolic panel.        Iron deficiency anemia    Significant decrease in hgb recently  (11.4 down to 8.7).  Being followed by hematology.  Just received iron infusion.  Discussed could be contributing to his sob.  Has not noticed any bleeding.  Has had extensive GI evaluation.  Recheck cbc.  Has f/u with hematology tomorrow.        Moderate COPD (chronic obstructive pulmonary disease) ([facility])    With increased sob, cough and congestion - continue Breo and albuterol.  Check cxr.  Prednisone taper as directed.  Discussed possible side effects and risk of prednisone. He has taken and tolerated previously.  Continue protonix.        Relevant Medications   predniSONE (DELTASONE) 10 MG tablet   Pulmonary hypertension ([place])     Followed by pulmonary and cardiology.        SOB (shortness of breath) - Primary    With worsening sob as outlined.  Significant DOE.  Unable to walk from his bedroom to bathroom.  Some better now.  EKG - SR/SB with ventricular rate 57 with RBBB (not new).  Concern over possible cardiac etiology.  He declines ER evaluation.  Continue eliquis, imdur and current medication regimen.  He just started back on lasix.  Check cxr.  Prednisone taper as directed.  Continue Breo and albuterol.  Hold abx.  F/u with cardiology and hematology as scheduled.  Any change or worsening, he is to be reevaluated.        Relevant Orders   EKG 12-Lead (Completed)   DG Chest 2 View (Completed)    Other Visit Diagnoses    Anemia, unspecified type  Iron deficiency.  Seeing hematology.  Receiving iron infustions.        I spent 45 minutes with the patient and more than 50% of the time was spent in consultation regarding the above.  Time spent obtaining history and discussing current symptoms and concerns.  Time also spent discussing treatment plan and plans for further evaluation.    Einar Pheasant, MD

## 2018-06-10 ENCOUNTER — Encounter: Payer: Self-pay | Admitting: Internal Medicine

## 2018-06-10 ENCOUNTER — Inpatient Hospital Stay: Payer: PPO

## 2018-06-10 VITALS — BP 162/80 | HR 63 | Temp 97.6°F | Resp 18

## 2018-06-10 DIAGNOSIS — R609 Edema, unspecified: Secondary | ICD-10-CM | POA: Diagnosis not present

## 2018-06-10 DIAGNOSIS — I2510 Atherosclerotic heart disease of native coronary artery without angina pectoris: Secondary | ICD-10-CM | POA: Diagnosis not present

## 2018-06-10 DIAGNOSIS — I10 Essential (primary) hypertension: Secondary | ICD-10-CM | POA: Diagnosis not present

## 2018-06-10 DIAGNOSIS — D509 Iron deficiency anemia, unspecified: Secondary | ICD-10-CM

## 2018-06-10 DIAGNOSIS — R600 Localized edema: Secondary | ICD-10-CM | POA: Insufficient documentation

## 2018-06-10 DIAGNOSIS — R0602 Shortness of breath: Secondary | ICD-10-CM | POA: Diagnosis not present

## 2018-06-10 MED ORDER — SODIUM CHLORIDE 0.9 % IV SOLN
Freq: Once | INTRAVENOUS | Status: AC
  Administered 2018-06-10: 10:00:00 via INTRAVENOUS
  Filled 2018-06-10: qty 250

## 2018-06-10 MED ORDER — SODIUM CHLORIDE 0.9 % IV SOLN
510.0000 mg | Freq: Once | INTRAVENOUS | Status: AC
  Administered 2018-06-10: 510 mg via INTRAVENOUS
  Filled 2018-06-10: qty 17

## 2018-06-10 NOTE — Assessment & Plan Note (Signed)
Controlled on current regimen.   

## 2018-06-10 NOTE — Assessment & Plan Note (Signed)
Low cholesterol diet and exercise.  Not on statin.  Follow lipid panel.

## 2018-06-10 NOTE — Assessment & Plan Note (Signed)
Significant decrease in hgb recently  (11.4 down to 8.7).  Being followed by hematology.  Just received iron infusion.  Discussed could be contributing to his sob.  Has not noticed any bleeding.  Has had extensive GI evaluation.  Recheck cbc.  Has f/u with hematology tomorrow.

## 2018-06-10 NOTE — Assessment & Plan Note (Signed)
Followed by pulmonary and cardiology.  

## 2018-06-10 NOTE — Assessment & Plan Note (Signed)
S/p cardioversion.  On eliquis.  Followed by cardiology.   

## 2018-06-10 NOTE — Assessment & Plan Note (Signed)
Not orthostatic on exam.  Continue current medication regimen. Check metabolic panel.  He just started back on lasix.

## 2018-06-10 NOTE — Assessment & Plan Note (Signed)
With increased sob, cough and congestion - continue Breo and albuterol.  Check cxr.  Prednisone taper as directed.  Discussed possible side effects and risk of prednisone. He has taken and tolerated previously.  Continue protonix.

## 2018-06-10 NOTE — Progress Notes (Signed)
Patient here for 2nd Feraheme infusion. He noticed more dyspnea lately, he thought the iron would help his shortness of breath but it hasnt yet. He saw his PCP Friday. He has a pulmonologist d/t  having COPD. He states he is seeing his cardiologist this afternoon. His pulse ox was 98-99% in clinic this morning on room air. He does not nor has ever used oxygen.

## 2018-06-10 NOTE — Assessment & Plan Note (Signed)
Followed by cardiology.  With recent sob and increased edema, etc, concern over possible cardiac etiology.  EKG - SR/SB with ventricular rate 58 with RBB.  Discussed evaluation in ER.  He declines.  Will check troponin and cxr.  He recently started back on lasix.  On imdur and eliquis.  Called cardiology.  appt with Dr Saralyn Pilar tomorrow.

## 2018-06-10 NOTE — Assessment & Plan Note (Signed)
Has been worked up extensively.  Has seen nephrology.  Recheck metabolic panel.

## 2018-06-10 NOTE — Assessment & Plan Note (Addendum)
With worsening sob as outlined.  Significant DOE.  Unable to walk from his bedroom to bathroom.  Some better now.  EKG - SR/SB with ventricular rate 57 with RBBB (not new).  Concern over possible cardiac etiology.  He declines ER evaluation.  Continue eliquis, imdur and current medication regimen.  He just started back on lasix.  Check cxr.  Prednisone taper as directed.  Continue Breo and albuterol.  Hold abx.  F/u with cardiology and hematology as scheduled.  Any change or worsening, he is to be reevaluated.

## 2018-06-21 NOTE — Progress Notes (Signed)
[place]  [phone] [phone]  ID: Troy Lopez OB: [birth year]  MR#: [id number]  WLN#:[id number]  Patient Care Team: Einar Pheasant, MD as PCP - General (Internal Medicine)  CHIEF COMPLAINT: [name]'s macroglobulinemia, iron deficiency anemia.  INTERVAL HISTORY: Patient returns to clinic today for repeat laboratory, further evaluation, and consideration of additional IV Feraheme.  He does admit to mild weakness and fatigue, but otherwise feels well.  He has no neurologic complaints. He denies any recent fevers or illnesses.  He has a good appetite and denies weight loss. He has no chest pain or shortness of breath. He denies any nausea, vomiting, constipation, or diarrhea.  He denies any melena or hematochezia.  He has no urinary complaints.  Patient offers no further specific complaints today.  REVIEW OF SYSTEMS:   Review of Systems  Constitutional: Positive for malaise/fatigue. Negative for fever and weight loss.  Respiratory: Negative.  Negative for cough, hemoptysis and shortness of breath.   Cardiovascular: Negative.  Negative for chest pain and leg swelling.  Gastrointestinal: Negative.  Negative for abdominal pain, blood in stool, diarrhea and melena.  Genitourinary: Negative.  Negative for hematuria.  Musculoskeletal: Negative.  Negative for back pain.  Skin: Negative.  Negative for rash.  Neurological: Positive for weakness. Negative for sensory change and focal weakness.  Psychiatric/Behavioral: Negative.  The patient is not nervous/anxious.     As per HPI. Otherwise, a complete review of systems is negative.  PAST MEDICAL HISTORY: Past Medical History:  Diagnosis Date  . Adrenal gland anomaly    enlargement  . Anxiety   . CAD (coronary artery disease)   . Carpal tunnel syndrome   . Chronic hyponatremia   . Colitis   . Colonic polyp   . COPD (chronic obstructive pulmonary disease) ([place])   . Degenerative disc  disease, lumbar   . Depression   . Diverticulosis   . Dyspnea   . GERD (gastroesophageal reflux disease)   . H/O degenerative disc disease   . Hypercholesterolemia   . Hyperkalemia   . Hyperlipidemia   . Hypertension   . Irritable bowel syndrome   . Monoclonal gammopathy   . Monoclonal gammopathy   . Neuropathy   . Personal history of tobacco use, presenting hazards to health 08/17/2015  . Sleep apnea   . Vertebral compression fracture ([place])     PAST SURGICAL HISTORY: Past Surgical History:  Procedure Laterality Date  . BUNIONECTOMY  1989  . CARPAL TUNNEL RELEASE Left 2011   ulnar nerve sub muscular at elbow  . CHOLECYSTECTOMY  09/07  . COLONOSCOPY WITH PROPOFOL N/A 03/05/2017   Procedure: COLONOSCOPY WITH PROPOFOL;  Surgeon: Lucilla Lame, MD;  Location[facility] ENDOSCOPY;  Service: Endoscopy;  Laterality: N/A;  . ELECTROPHYSIOLOGIC STUDY N/A 11/15/2015   Procedure: CARDIOVERSION;  Surgeon: Isaias Cowman, MD;  Location: [facility] ORS;  Service: Cardiovascular;  Laterality: N/A;  . ESOPHAGOGASTRODUODENOSCOPY (EGD) WITH PROPOFOL N/A 03/05/2017   Procedure: ESOPHAGOGASTRODUODENOSCOPY (EGD) WITH PROPOFOL;  Surgeon: Lucilla Lame, MD;  Location: [facility] ENDOSCOPY;  Service: Endoscopy;  Laterality: N/A;  . ESOPHAGOGASTRODUODENOSCOPY (EGD) WITH PROPOFOL N/A 10/17/2017   Procedure: ESOPHAGOGASTRODUODENOSCOPY (EGD) WITH PROPOFOL;  Surgeon: Lollie Sails, MD;  Location: [facility] ENDOSCOPY;  Service: Endoscopy;  Laterality: N/A;  . EYE SURGERY Bilateral 2010   cataract  . HEMORRHOID SURGERY    . KYPHOPLASTY N/A 11/04/2016   Procedure: KYPHOPLASTY T 12;  Surgeon: Hessie Knows, MD;  Location: [facility] ORS;  Service: Orthopedics;  Laterality: N/A;  FAMILY HISTORY Family History  Problem Relation Age of Onset  . Stroke Mother   . Heart disease Father        MI - 39   . Colon cancer Neg Hx   . Prostate cancer Neg Hx        ADVANCED DIRECTIVES:    HEALTH MAINTENANCE: Social History    Tobacco Use  . Smoking status: Former Smoker    Types: Cigarettes    Last attempt to quit: 11/01/2015    Years since quitting: 2.6  . Smokeless tobacco: Never Used  . Tobacco comment: about 2 cigarettes per day, trying to quit  Substance Use Topics  . Alcohol use: Yes    Alcohol/week: 42.0 standard drinks    Types: 42 Cans of beer per week    Comment: occas  . Drug use: No     Colonoscopy:  PAP:  Bone density:  Lipid panel:  Allergies  Allergen Reactions  . Iodinated Diagnostic Agents Other (See Comments)    Contraindication secondary to IGMgammopathy/Waldren's syndrome   Not to be given due to Waldenstrom's syndrome, told it could affect kidney function    Current Outpatient Medications  Medication Sig Dispense Refill  . acetaminophen (TYLENOL) 500 MG tablet Take 500-1,000 mg by mouth 3 (three) times daily as needed for moderate pain or headache.    . alendronate (FOSAMAX) 70 MG tablet Take 70 mg by mouth once a week. Take with a full glass of water on an empty stomach. Takes on Sundays    . amLODipine (NORVASC) 10 MG tablet Take 10 mg by mouth daily.    . budesonide (ENTOCORT EC) 3 MG 24 hr capsule Take 9 mg by mouth daily as needed (colitis flare).     . calcium-vitamin D (OSCAL WITH D) 500-200 MG-UNIT tablet Take 2 tablets by mouth daily.    Marland Kitchen docusate sodium (COLACE) 100 MG capsule     . ELIQUIS 5 MG TABS tablet Take 5 mg by mouth 2 (two) times daily.     Marland Kitchen FIBER PO Take 2 capsules by mouth daily.    . fluticasone furoate-vilanterol (BREO ELLIPTA) 100-25 MCG/INH AEPB Inhale 1 puff into the lungs daily. 180 each 3  . gabapentin (NEURONTIN) 100 MG capsule Take 4 capsules (400 mg) daily in the morning and midday. Take 8 capsules (800 mg) daily in the evening. 480 capsule 1  . metoprolol tartrate (LOPRESSOR) 100 MG tablet Take 1 tablet (100 mg total) by mouth 2 (two) times daily. 60 tablet 2  . pantoprazole (PROTONIX) 40 MG tablet Take 1 tablet (40 mg total) by mouth 2  (two) times daily. 180 tablet 0  . promethazine (PHENERGAN) 12.5 MG tablet Take 12.5 mg by mouth every 6 (six) hours as needed for nausea or vomiting.    . sertraline (ZOLOFT) 50 MG tablet Take 1 tablet (50 mg total) by mouth daily. 30 tablet 0  . vitamin B-12 (CYANOCOBALAMIN) 1000 MCG tablet Take 1,000 mcg by mouth daily.     Marland Kitchen albuterol (PROVENTIL HFA;VENTOLIN HFA) 108 (90 Base) MCG/ACT inhaler Inhale 2 puffs into the lungs every 6 (six) hours as needed for wheezing or shortness of breath. (Patient not taking: Reported on 05/25/2018) 1 Inhaler 10  . azelastine (ASTELIN) 0.1 % nasal spray Place 1 spray into both nostrils 2 (two) times daily. Use in each nostril as directed (Patient not taking: Reported on 05/25/2018) 30 mL 1  . fluticasone (FLONASE) 50 MCG/ACT nasal spray Place 2 sprays into both nostrils  daily. (Patient not taking: Reported on 05/25/2018) 16 g 0  . furosemide (LASIX) 20 MG tablet Take 1 tablet (20 mg total) by mouth daily as needed. (Patient not taking: Reported on 05/25/2018) 30 tablet 0  . isosorbide mononitrate (IMDUR) 30 MG 24 hr tablet Take 1 tablet (30 mg total) by mouth once daily.     No current facility-administered medications for this visit.     OBJECTIVE: Vitals:   06/26/18 1046  BP: (!) 150/77  Pulse: (!) 57  Resp: 20  Temp: 98.2 F (36.8 C)     Body mass index is 26.35 kg/m.    ECOG FS:0 - Asymptomatic  General: Well-developed, well-nourished, no acute distress. Eyes: Pink conjunctiva, anicteric sclera. HEENT: Normocephalic, moist mucous membranes. Lungs: Clear to auscultation bilaterally. Heart: Regular rate and rhythm. No rubs, murmurs, or gallops. Abdomen: Soft, nontender, nondistended. No organomegaly noted, normoactive bowel sounds. Musculoskeletal: No edema, cyanosis, or clubbing. Neuro: Alert, answering all questions appropriately. Cranial nerves grossly intact. Skin: No rashes or petechiae noted. Psych: Normal affect.  LAB RESULTS:  Lab  Results  Component Value Date   NA 131 (L) 06/09/2018   K 4.3 06/09/2018   CL 100 06/09/2018   CO2 22 06/09/2018   GLUCOSE 159 (H) 06/09/2018   BUN 12 06/09/2018   CREATININE 1.22 06/09/2018   CALCIUM 9.2 06/09/2018   PROT 7.9 02/18/2018   ALBUMIN 3.3 (L) 02/18/2018   AST 21 02/18/2018   ALT 11 02/18/2018   ALKPHOS 48 02/18/2018   BILITOT 0.7 02/18/2018   GFRNONAA 56 (L) 06/09/2018   GFRAA >60 06/09/2018    Lab Results  Component Value Date   WBC 5.9 06/24/2018   NEUTROABS 4.5 06/24/2018   HGB 10.6 (L) 06/24/2018   HCT 33.1 (L) 06/24/2018   MCV 108.5 (H) 06/24/2018   PLT 323 06/24/2018   Lab Results  Component Value Date   IRON 170 06/24/2018   TIBC 1,036 (H) 06/24/2018   IRONPCTSAT 16 (L) 06/24/2018    Lab Results  Component Value Date   FERRITIN 106 06/24/2018   Lab Results  Component Value Date   TOTALPROTELP 7.9 05/27/2018   ALBUMINELP 3.3 05/27/2018   A1GS 0.3 05/27/2018   A2GS 0.6 05/27/2018   BETS 1.7 (H) 05/27/2018   BETA2SER 0.2 02/14/2016   GAMS 2.1 (H) 05/27/2018   MSPIKE 0.9 (H) 05/27/2018   SPEI Comment 05/27/2018    STUDIES: Dg Chest 2 View  Result Date: 06/09/2018 CLINICAL DATA:  Shortness of breath. EXAM: CHEST - 2 VIEW COMPARISON:  Chest x-ray dated April 16, 2018. FINDINGS: The heart is at the upper limits of normal in size. Normal mediastinal contours. Atherosclerotic calcification of the aortic arch. The lungs remain hyperinflated with emphysematous changes and chronically coarsened interstitial markings in both upper lobes. There is superimposed mild diffuse interstitial thickening and a trace right pleural effusion. No pneumothorax. No acute osseous abnormality. IMPRESSION: 1. Mild diffusely increased interstitial markings favored to reflect interstitial edema in the setting of new trace right pleural effusion. 2. COPD and chronic interstitial lung disease. Electronically Signed   By: Titus Dubin M.D.   On: 06/09/2018 18:47     ASSESSMENT: [name]'s macroglobulinemia, iron deficiency anemia.  PLAN:    1. [name]'s macroglobulinemia:  Bone marrow biopsy results reviewed independently confirming underlying [name]'s.  Patient's most recent M spike from May 27, 2018 is 1.7 which is essentially unchanged from previous.  His IgM levels remain persistently elevated, but has trended down slightly and is  now 1745.  His kappa/lambda light chain ratio is stable at 4.99.  Patient has iron deficiency anemia from a GI source, but no other evidence of endorgan damage. He does not require any treatment at this time. If he had progression of disease as evidenced by B-symptoms or endorgan damage, will consider Rituxan-based therapy.  Continue to monitor laboratory work every 3 to 6 months.   2. 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  Patient's hemoglobin and iron stores have improved, but are still decreased and he is mildly symptomatic.  Proceed with 510 mg IV Feraheme today.  Return to clinic in 3 months with repeat laboratory work and further evaluation.   3.  Back pain: Continue monitoring and treatment per pain clinic.  I spent a total of 30 minutes face-to-face with the patient of which greater than 50% of the visit was spent in counseling and coordination of care as detailed above.   Patient expressed understanding and was in agreement with this plan. He also understands that He can call clinic at any time with any questions, concerns, or complaints.    Lloyd Huger, MD   06/28/2018 8:58 AM

## 2018-06-22 DIAGNOSIS — Z85828 Personal history of other malignant neoplasm of skin: Secondary | ICD-10-CM | POA: Diagnosis not present

## 2018-06-22 DIAGNOSIS — L570 Actinic keratosis: Secondary | ICD-10-CM | POA: Diagnosis not present

## 2018-06-22 DIAGNOSIS — Z86018 Personal history of other benign neoplasm: Secondary | ICD-10-CM | POA: Diagnosis not present

## 2018-06-22 DIAGNOSIS — Z1283 Encounter for screening for malignant neoplasm of skin: Secondary | ICD-10-CM | POA: Diagnosis not present

## 2018-06-22 DIAGNOSIS — Z872 Personal history of diseases of the skin and subcutaneous tissue: Secondary | ICD-10-CM | POA: Diagnosis not present

## 2018-06-22 DIAGNOSIS — L578 Other skin changes due to chronic exposure to nonionizing radiation: Secondary | ICD-10-CM | POA: Diagnosis not present

## 2018-06-22 DIAGNOSIS — L720 Epidermal cyst: Secondary | ICD-10-CM | POA: Diagnosis not present

## 2018-06-24 ENCOUNTER — Inpatient Hospital Stay: Payer: PPO | Attending: Oncology

## 2018-06-24 ENCOUNTER — Other Ambulatory Visit: Payer: Self-pay | Admitting: *Deleted

## 2018-06-24 DIAGNOSIS — D508 Other iron deficiency anemias: Secondary | ICD-10-CM | POA: Insufficient documentation

## 2018-06-24 DIAGNOSIS — M549 Dorsalgia, unspecified: Secondary | ICD-10-CM | POA: Insufficient documentation

## 2018-06-24 DIAGNOSIS — C880 Waldenstrom macroglobulinemia: Secondary | ICD-10-CM | POA: Insufficient documentation

## 2018-06-24 DIAGNOSIS — D649 Anemia, unspecified: Secondary | ICD-10-CM

## 2018-06-24 DIAGNOSIS — K319 Disease of stomach and duodenum, unspecified: Secondary | ICD-10-CM | POA: Insufficient documentation

## 2018-06-24 LAB — CBC WITH DIFFERENTIAL/PLATELET
ABS IMMATURE GRANULOCYTES: 0.03 10*3/uL (ref 0.00–0.07)
BASOS ABS: 0 10*3/uL (ref 0.0–0.1)
Basophils Relative: 1 %
EOS PCT: 0 %
Eosinophils Absolute: 0 10*3/uL (ref 0.0–0.5)
HEMATOCRIT: 33.1 % — AB (ref 39.0–52.0)
HEMOGLOBIN: 10.6 g/dL — AB (ref 13.0–17.0)
IMMATURE GRANULOCYTES: 1 %
LYMPHS ABS: 1 10*3/uL (ref 0.7–4.0)
Lymphocytes Relative: 16 %
MCH: 34.8 pg — ABNORMAL HIGH (ref 26.0–34.0)
MCHC: 32 g/dL (ref 30.0–36.0)
MCV: 108.5 fL — ABNORMAL HIGH (ref 80.0–100.0)
MONO ABS: 0.4 10*3/uL (ref 0.1–1.0)
Monocytes Relative: 7 %
Neutro Abs: 4.5 10*3/uL (ref 1.7–7.7)
Neutrophils Relative %: 75 %
Platelets: 323 10*3/uL (ref 150–400)
RBC: 3.05 MIL/uL — ABNORMAL LOW (ref 4.22–5.81)
RDW: 20.1 % — ABNORMAL HIGH (ref 11.5–15.5)
WBC: 5.9 10*3/uL (ref 4.0–10.5)
nRBC: 0 % (ref 0.0–0.2)

## 2018-06-24 LAB — IRON AND TIBC
Iron: 170 ug/dL (ref 45–182)
SATURATION RATIOS: 16 % — AB (ref 17.9–39.5)
TIBC: 1036 ug/dL — AB (ref 250–450)
UIBC: 866 ug/dL

## 2018-06-24 LAB — FERRITIN: Ferritin: 106 ng/mL (ref 24–336)

## 2018-06-25 DIAGNOSIS — R0602 Shortness of breath: Secondary | ICD-10-CM | POA: Diagnosis not present

## 2018-06-25 DIAGNOSIS — I10 Essential (primary) hypertension: Secondary | ICD-10-CM | POA: Diagnosis not present

## 2018-06-25 DIAGNOSIS — I2510 Atherosclerotic heart disease of native coronary artery without angina pectoris: Secondary | ICD-10-CM | POA: Diagnosis not present

## 2018-06-25 DIAGNOSIS — R609 Edema, unspecified: Secondary | ICD-10-CM | POA: Diagnosis not present

## 2018-06-26 ENCOUNTER — Inpatient Hospital Stay: Payer: PPO

## 2018-06-26 ENCOUNTER — Inpatient Hospital Stay (HOSPITAL_BASED_OUTPATIENT_CLINIC_OR_DEPARTMENT_OTHER): Payer: PPO | Admitting: Oncology

## 2018-06-26 ENCOUNTER — Encounter: Payer: Self-pay | Admitting: Oncology

## 2018-06-26 VITALS — BP 136/77 | HR 56 | Resp 20

## 2018-06-26 VITALS — BP 150/77 | HR 57 | Temp 98.2°F | Resp 20 | Wt 188.9 lb

## 2018-06-26 DIAGNOSIS — K319 Disease of stomach and duodenum, unspecified: Secondary | ICD-10-CM

## 2018-06-26 DIAGNOSIS — M549 Dorsalgia, unspecified: Secondary | ICD-10-CM

## 2018-06-26 DIAGNOSIS — D509 Iron deficiency anemia, unspecified: Secondary | ICD-10-CM

## 2018-06-26 DIAGNOSIS — D508 Other iron deficiency anemias: Secondary | ICD-10-CM

## 2018-06-26 DIAGNOSIS — C880 Waldenstrom macroglobulinemia: Secondary | ICD-10-CM

## 2018-06-26 MED ORDER — SODIUM CHLORIDE 0.9 % IV SOLN
Freq: Once | INTRAVENOUS | Status: AC
  Administered 2018-06-26: 11:00:00 via INTRAVENOUS
  Filled 2018-06-26: qty 250

## 2018-06-26 MED ORDER — SODIUM CHLORIDE 0.9 % IV SOLN
510.0000 mg | Freq: Once | INTRAVENOUS | Status: AC
  Administered 2018-06-26: 510 mg via INTRAVENOUS
  Filled 2018-06-26: qty 17

## 2018-06-26 NOTE — Patient Instructions (Signed)

## 2018-06-26 NOTE — Progress Notes (Signed)
Patient denies any concerns today.  

## 2018-07-02 ENCOUNTER — Other Ambulatory Visit: Payer: Self-pay | Admitting: Internal Medicine

## 2018-07-07 ENCOUNTER — Encounter: Payer: Self-pay | Admitting: Internal Medicine

## 2018-07-07 ENCOUNTER — Ambulatory Visit (INDEPENDENT_AMBULATORY_CARE_PROVIDER_SITE_OTHER): Payer: PPO | Admitting: Internal Medicine

## 2018-07-07 DIAGNOSIS — I712 Thoracic aortic aneurysm, without rupture, unspecified: Secondary | ICD-10-CM

## 2018-07-07 DIAGNOSIS — I4892 Unspecified atrial flutter: Secondary | ICD-10-CM | POA: Diagnosis not present

## 2018-07-07 DIAGNOSIS — F32A Depression, unspecified: Secondary | ICD-10-CM

## 2018-07-07 DIAGNOSIS — Z23 Encounter for immunization: Secondary | ICD-10-CM | POA: Diagnosis not present

## 2018-07-07 DIAGNOSIS — I10 Essential (primary) hypertension: Secondary | ICD-10-CM

## 2018-07-07 DIAGNOSIS — K219 Gastro-esophageal reflux disease without esophagitis: Secondary | ICD-10-CM

## 2018-07-07 DIAGNOSIS — M549 Dorsalgia, unspecified: Secondary | ICD-10-CM

## 2018-07-07 DIAGNOSIS — F329 Major depressive disorder, single episode, unspecified: Secondary | ICD-10-CM | POA: Diagnosis not present

## 2018-07-07 DIAGNOSIS — E871 Hypo-osmolality and hyponatremia: Secondary | ICD-10-CM

## 2018-07-07 DIAGNOSIS — I2510 Atherosclerotic heart disease of native coronary artery without angina pectoris: Secondary | ICD-10-CM | POA: Diagnosis not present

## 2018-07-07 DIAGNOSIS — K52831 Collagenous colitis: Secondary | ICD-10-CM | POA: Diagnosis not present

## 2018-07-07 DIAGNOSIS — G8929 Other chronic pain: Secondary | ICD-10-CM

## 2018-07-07 DIAGNOSIS — E785 Hyperlipidemia, unspecified: Secondary | ICD-10-CM

## 2018-07-07 DIAGNOSIS — D509 Iron deficiency anemia, unspecified: Secondary | ICD-10-CM

## 2018-07-07 DIAGNOSIS — I272 Pulmonary hypertension, unspecified: Secondary | ICD-10-CM | POA: Diagnosis not present

## 2018-07-07 DIAGNOSIS — C8800 Waldenstrom macroglobulinemia not having achieved remission: Secondary | ICD-10-CM

## 2018-07-07 MED ORDER — MUPIROCIN 2 % EX OINT: TOPICAL_OINTMENT | CUTANEOUS | 0 refills | Status: DC

## 2018-07-07 NOTE — Progress Notes (Signed)
Patient ID: Troy Lopez, male   DOB: 11-Apr-1941, 77 y.o.   MRN: [id number]   Subjective:    Patient ID: Troy Lopez, male    DOB: 02-07-1941, 77 y.o.   MRN: [id number]  HPI   Patient here for a scheduled follow up.  Recently evaluated for near syncope, worsening sob, etc.   See last note for details.  Saw cardiology 06/10/18.  Lasix increased to 40mg  q day.  Remains on eliquis.  Recommended f/u echo.  ECHO - ok.  Continues on lasix.  Blood pressure averaging 629-528 systolic readings.  Also seeing Dr Grayland Ormond for f/u Waldenstrom's and iron deficiency anemia.  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  hgb and iron stores have improved.  Received IV iron 06/26/18.  Feeling better.  No chest pain.  Breathing better.  No acid reflux.  No abdominal pain.  Bowels stable.     Past Medical History:  Diagnosis Date  . Adrenal gland anomaly    enlargement  . Anxiety   . CAD (coronary artery disease)   . Carpal tunnel syndrome   . Chronic hyponatremia   . Colitis   . Colonic polyp   . COPD (chronic obstructive pulmonary disease) ([place])   . Degenerative disc disease, lumbar   . Depression   . Diverticulosis   . Dyspnea   . GERD (gastroesophageal reflux disease)   . H/O degenerative disc disease   . Hypercholesterolemia   . Hyperkalemia   . Hyperlipidemia   . Hypertension   . Irritable bowel syndrome   . Monoclonal gammopathy   . Monoclonal gammopathy   . Neuropathy   . Personal history of tobacco use, presenting hazards to health 08/17/2015  . Sleep apnea   . Vertebral compression fracture [facility])    Past Surgical History:  Procedure Laterality Date  . BUNIONECTOMY  1989  . CARPAL TUNNEL RELEASE Left 2011   ulnar nerve sub muscular at elbow  . CHOLECYSTECTOMY  09/07  . COLONOSCOPY WITH PROPOFOL N/A 03/05/2017   Procedure: COLONOSCOPY WITH PROPOFOL;  Surgeon: Lucilla Lame, MD;  Location: [facility] ENDOSCOPY;  Service: Endoscopy;   Laterality: N/A;  . ELECTROPHYSIOLOGIC STUDY N/A 11/15/2015   Procedure: CARDIOVERSION;  Surgeon: Isaias Cowman, MD;  Location: [facility] ORS;  Service: Cardiovascular;  Laterality: N/A;  . ESOPHAGOGASTRODUODENOSCOPY (EGD) WITH PROPOFOL N/A 03/05/2017   Procedure: ESOPHAGOGASTRODUODENOSCOPY (EGD) WITH PROPOFOL;  Surgeon: Lucilla Lame, MD;  Location: [facility] ENDOSCOPY;  Service: Endoscopy;  Laterality: N/A;  . ESOPHAGOGASTRODUODENOSCOPY (EGD) WITH PROPOFOL N/A 10/17/2017   Procedure: ESOPHAGOGASTRODUODENOSCOPY (EGD) WITH PROPOFOL;  Surgeon: Lollie Sails, MD;  Location: [facility] ENDOSCOPY;  Service: Endoscopy;  Laterality: N/A;  . EYE SURGERY Bilateral 2010   cataract  . HEMORRHOID SURGERY    . KYPHOPLASTY N/A 11/04/2016   Procedure: KYPHOPLASTY T 12;  Surgeon: Hessie Knows, MD;  Location: [facility] ORS;  Service: Orthopedics;  Laterality: N/A;   Family History  Problem Relation Age of Onset  . Stroke Mother   . Heart disease Father        MI - 102   . Colon cancer Neg Hx   . Prostate cancer Neg Hx    Social History   Socioeconomic History  . Marital status: Married    Spouse name: Not on file  . Number of children: 3  . Years of education: Not on file  . Highest education level: Not on file  Occupational History  . Not  on file  Social Needs  . Financial resource strain: Not on file  . Food insecurity:    Worry: Not on file    Inability: Not on file  . Transportation needs:    Medical: Not on file    Non-medical: Not on file  Tobacco Use  . Smoking status: Former Smoker    Types: Cigarettes    Last attempt to quit: 11/01/2015    Years since quitting: 2.7  . Smokeless tobacco: Never Used  . Tobacco comment: about 2 cigarettes per day, trying to quit  Substance and Sexual Activity  . Alcohol use: Yes    Alcohol/week: 42.0 standard drinks    Types: 42 Cans of beer per week    Comment: occas  . Drug use: No  . Sexual activity: Not on file  Lifestyle  . Physical activity:    Days  per week: Not on file    Minutes per session: Not on file  . Stress: Not on file  Relationships  . Social connections:    Talks on phone: Not on file    Gets together: Not on file    Attends religious service: Not on file    Active member of club or organization: Not on file    Attends meetings of clubs or organizations: Not on file    Relationship status: Not on file  Other Topics Concern  . Not on file  Social History Narrative  . Not on file    Outpatient Encounter Medications as of 07/07/2018  Medication Sig  . acetaminophen (TYLENOL) 500 MG tablet Take 500-1,000 mg by mouth 3 (three) times daily as needed for moderate pain or headache.  . albuterol (PROVENTIL HFA;VENTOLIN HFA) 108 (90 Base) MCG/ACT inhaler Inhale 2 puffs into the lungs every 6 (six) hours as needed for wheezing or shortness of breath. (Patient not taking: Reported on 05/25/2018)  . alendronate (FOSAMAX) 70 MG tablet Take 70 mg by mouth once a week. Take with a full glass of water on an empty stomach. Takes on Sundays  . amLODipine (NORVASC) 10 MG tablet Take 10 mg by mouth daily.  Marland Kitchen azelastine (ASTELIN) 0.1 % nasal spray Place 1 spray into both nostrils 2 (two) times daily. Use in each nostril as directed (Patient not taking: Reported on 05/25/2018)  . budesonide (ENTOCORT EC) 3 MG 24 hr capsule Take 9 mg by mouth daily as needed (colitis flare).   . calcium-vitamin D (OSCAL WITH D) 500-200 MG-UNIT tablet Take 2 tablets by mouth daily.  Marland Kitchen docusate sodium (COLACE) 100 MG capsule   . ELIQUIS 5 MG TABS tablet Take 5 mg by mouth 2 (two) times daily.   Marland Kitchen FIBER PO Take 2 capsules by mouth daily.  . fluticasone (FLONASE) 50 MCG/ACT nasal spray Place 2 sprays into both nostrils daily. (Patient not taking: Reported on 05/25/2018)  . fluticasone furoate-vilanterol (BREO ELLIPTA) 100-25 MCG/INH AEPB Inhale 1 puff into the lungs daily.  . furosemide (LASIX) 20 MG tablet Take 1 tablet (20 mg total) by mouth daily as needed.  (Patient taking differently: Take 40 mg by mouth daily. )  . gabapentin (NEURONTIN) 100 MG capsule Take 4 capsules (400 mg) daily in the morning and midday. Take 8 capsules (800 mg) daily in the evening.  . isosorbide mononitrate (IMDUR) 30 MG 24 hr tablet Take 1 tablet (30 mg total) by mouth once daily.  . metoprolol tartrate (LOPRESSOR) 100 MG tablet Take 1 tablet (100 mg total) by mouth 2 (two) times  daily. (Patient taking differently: Take 50 mg by mouth 2 (two) times daily. )  . mupirocin ointment (BACTROBAN) 2 % Apply to affected area bid.  . pantoprazole (PROTONIX) 40 MG tablet Take 1 tablet (40 mg total) by mouth 2 (two) times daily.  . promethazine (PHENERGAN) 12.5 MG tablet Take 12.5 mg by mouth every 6 (six) hours as needed for nausea or vomiting.  . sertraline (ZOLOFT) 50 MG tablet Take 1 tablet (50 mg total) by mouth daily.  . vitamin B-12 (CYANOCOBALAMIN) 1000 MCG tablet Take 1,000 mcg by mouth daily.    No facility-administered encounter medications on file as of 07/07/2018.     Review of Systems  Constitutional: Negative for appetite change and unexpected weight change.  HENT: Negative for congestion and sinus pressure.   Respiratory: Negative for cough and chest tightness.        Breathing stable.    Cardiovascular: Negative for chest pain, palpitations and leg swelling.  Gastrointestinal: Negative for abdominal pain, diarrhea, nausea and vomiting.  Genitourinary: Negative for difficulty urinating and dysuria.  Musculoskeletal: Negative for joint swelling and myalgias.  Skin: Negative for color change and rash.  Neurological: Negative for dizziness, light-headedness and headaches.  Psychiatric/Behavioral: Negative for agitation and dysphoric mood.       Objective:    Physical Exam  Constitutional: He appears well-developed and well-nourished. No distress.  HENT:  Nose: Nose normal.  Mouth/Throat: Oropharynx is clear and moist.  Neck: Neck supple.  Cardiovascular:  Normal rate and regular rhythm.  Pulmonary/Chest: Effort normal and breath sounds normal. No respiratory distress.  Abdominal: Soft. Bowel sounds are normal. There is no tenderness.  Musculoskeletal: He exhibits no edema or tenderness.  Lymphadenopathy:    He has no cervical adenopathy.  Skin: No rash noted. No erythema.  Psychiatric: He has a normal mood and affect. His behavior is normal.    BP 130/78 (BP Location: Left Arm, Patient Position: Sitting, Cuff Size: Normal)   Pulse 60   Temp 98.1 F (36.7 C) (Oral)   Resp 18   Wt 190 lb 9.6 oz (86.5 kg)   SpO2 98%   BMI 26.58 kg/m  Wt Readings from Last 3 Encounters:  07/07/18 190 lb 9.6 oz (86.5 kg)  06/26/18 188 lb 15 oz (85.7 kg)  06/09/18 192 lb 3.2 oz (87.2 kg)     Lab Results  Component Value Date   WBC 5.9 06/24/2018   HGB 10.6 (L) 06/24/2018   HCT 33.1 (L) 06/24/2018   PLT 323 06/24/2018   GLUCOSE 159 (H) 06/09/2018   CHOL 148 12/17/2017   TRIG 66.0 12/17/2017   HDL 73.90 12/17/2017   LDLCALC 61 12/17/2017   ALT 11 02/18/2018   AST 21 02/18/2018   NA 131 (L) 06/09/2018   K 4.3 06/09/2018   CL 100 06/09/2018   CREATININE 1.22 06/09/2018   BUN 12 06/09/2018   CO2 22 06/09/2018   TSH 1.75 08/18/2017   PSA 1.79 12/17/2017   INR 1.11 01/08/2017   HGBA1C 5.9 12/17/2017   MICROALBUR 1.9 10/27/2017    Dg Chest 2 View  Result Date: 06/09/2018 CLINICAL DATA:  Shortness of breath. EXAM: CHEST - 2 VIEW COMPARISON:  Chest x-ray dated April 16, 2018. FINDINGS: The heart is at the upper limits of normal in size. Normal mediastinal contours. Atherosclerotic calcification of the aortic arch. The lungs remain hyperinflated with emphysematous changes and chronically coarsened interstitial markings in both upper lobes. There is superimposed mild diffuse interstitial thickening and a  trace right pleural effusion. No pneumothorax. No acute osseous abnormality. IMPRESSION: 1. Mild diffusely increased interstitial markings  favored to reflect interstitial edema in the setting of new trace right pleural effusion. 2. COPD and chronic interstitial lung disease. Electronically Signed   By: Titus Dubin M.D.   On: 06/09/2018 18:47       Assessment & Plan:   Problem List Items Addressed This Visit    Atrial flutter [facility])    S/p cardioversion.  On eliquis.  Followed by cardiology.        CAD (coronary artery disease)    On lasix.  Doing better on current regimen.  Continue f/u with cardiology.        Chronic back pain    Has seen Dr Sharlet Salina previously.  Now being followed at pain clinic.        Collagenous colitis    Stable.  Followed by GI.        Depression    On zoloft.  Stable.        GERD (gastroesophageal reflux disease)    Controlled on current regimen.  Follow.        Hyperlipemia    Low cholesterol diet and exercise.  Follow lipid panel.        Hypertension    Blood pressure under reasonable control.  Continue same medication regimen.  Follow pressures.  Follow metabolic panel.        Hyponatremia    Has been worked up extensively.  Has been evaluated by nephrology.  Stable.  Follow.        Iron deficiency anemia    Seeing hematology.  S/p IV iron.  hgb improved.  Feels better.  Continue f/u with hematology.        Pulmonary hypertension ([place])    Followed by pulmonary and cardiology.        Thoracic aortic aneurysm (HCC)    Has 4cm ascending thoracic aortic aneurysm.  Recommended f/u CT chest in one year.  Last evaluated 04/2018.        Waldenstrom's macroglobulinemia ([place])    Followed by hematology.  Stable.         Other Visit Diagnoses    Encounter for immunization       Relevant Orders   Flu vaccine HIGH DOSE PF (Completed)       Einar Pheasant, MD

## 2018-07-13 DIAGNOSIS — G4733 Obstructive sleep apnea (adult) (pediatric): Secondary | ICD-10-CM | POA: Diagnosis not present

## 2018-07-13 DIAGNOSIS — E7800 Pure hypercholesterolemia, unspecified: Secondary | ICD-10-CM | POA: Diagnosis not present

## 2018-07-13 DIAGNOSIS — R0602 Shortness of breath: Secondary | ICD-10-CM | POA: Diagnosis not present

## 2018-07-13 DIAGNOSIS — J410 Simple chronic bronchitis: Secondary | ICD-10-CM | POA: Diagnosis not present

## 2018-07-13 DIAGNOSIS — I272 Pulmonary hypertension, unspecified: Secondary | ICD-10-CM | POA: Diagnosis not present

## 2018-07-13 DIAGNOSIS — I2510 Atherosclerotic heart disease of native coronary artery without angina pectoris: Secondary | ICD-10-CM | POA: Diagnosis not present

## 2018-07-13 DIAGNOSIS — R0789 Other chest pain: Secondary | ICD-10-CM | POA: Diagnosis not present

## 2018-07-13 DIAGNOSIS — I10 Essential (primary) hypertension: Secondary | ICD-10-CM | POA: Diagnosis not present

## 2018-07-13 DIAGNOSIS — Z9889 Other specified postprocedural states: Secondary | ICD-10-CM | POA: Diagnosis not present

## 2018-07-13 DIAGNOSIS — R609 Edema, unspecified: Secondary | ICD-10-CM | POA: Diagnosis not present

## 2018-07-13 DIAGNOSIS — Z720 Tobacco use: Secondary | ICD-10-CM | POA: Diagnosis not present

## 2018-07-19 ENCOUNTER — Encounter: Payer: Self-pay | Admitting: Internal Medicine

## 2018-07-19 NOTE — Assessment & Plan Note (Signed)
Has 4cm ascending thoracic aortic aneurysm.  Recommended f/u CT chest in one year.  Last evaluated 04/2018.

## 2018-07-19 NOTE — Assessment & Plan Note (Signed)
Blood pressure under reasonable control.  Continue same medication regimen.  Follow pressures.  Follow metabolic panel.   

## 2018-07-19 NOTE — Assessment & Plan Note (Signed)
S/p cardioversion.  On eliquis.  Followed by cardiology.

## 2018-07-19 NOTE — Assessment & Plan Note (Signed)
Seeing hematology.  S/p IV iron.  hgb improved.  Feels better.  Continue f/u with hematology.

## 2018-07-19 NOTE — Assessment & Plan Note (Signed)
Stable. Followed by GI. 

## 2018-07-19 NOTE — Assessment & Plan Note (Signed)
Has been worked up extensively.  Has been evaluated by nephrology.  Stable.  Follow.   

## 2018-07-19 NOTE — Assessment & Plan Note (Signed)
On zoloft.  Stable.  

## 2018-07-19 NOTE — Assessment & Plan Note (Signed)
Followed by pulmonary and cardiology.  

## 2018-07-19 NOTE — Assessment & Plan Note (Signed)
Has seen Dr Sharlet Salina previously.  Now being followed at pain clinic.

## 2018-07-19 NOTE — Assessment & Plan Note (Signed)
On lasix.  Doing better on current regimen.  Continue f/u with cardiology.

## 2018-07-19 NOTE — Assessment & Plan Note (Signed)
Followed by hematology. Stable.  

## 2018-07-19 NOTE — Assessment & Plan Note (Signed)
Low cholesterol diet and exercise.  Follow lipid panel.   

## 2018-07-19 NOTE — Assessment & Plan Note (Signed)
Controlled on current regimen.  Follow.  

## 2018-08-21 ENCOUNTER — Other Ambulatory Visit: Payer: Self-pay | Admitting: Internal Medicine

## 2018-08-21 MED ORDER — AMLODIPINE BESYLATE 10 MG PO TABS: 10.0000 mg | ORAL_TABLET | Freq: Every day | ORAL | 3 refills | Status: DC

## 2018-08-21 NOTE — Telephone Encounter (Signed)
rx ok'd for gabapentin #[id number] with one refill.

## 2018-08-21 NOTE — Telephone Encounter (Signed)
Please call and confirm dose pt is taking and then ok to refill.

## 2018-08-21 NOTE — Telephone Encounter (Signed)
Patient confirmed 10mg  daily & I have sent to pharmacy.

## 2018-08-25 DIAGNOSIS — Z79899 Other long term (current) drug therapy: Secondary | ICD-10-CM | POA: Diagnosis not present

## 2018-08-28 ENCOUNTER — Telehealth: Payer: Self-pay | Admitting: Internal Medicine

## 2018-08-28 DIAGNOSIS — M4725 Other spondylosis with radiculopathy, thoracolumbar region: Secondary | ICD-10-CM | POA: Diagnosis not present

## 2018-08-28 DIAGNOSIS — M545 Low back pain: Secondary | ICD-10-CM | POA: Diagnosis not present

## 2018-08-28 NOTE — Telephone Encounter (Signed)
Copied from [place] [id number]. Topic: General - Inquiry >> Aug 28, 2018  9:05 AM Vernona Rieger wrote: Reason for CRM: Patient's [employer] insurance plan called to see if the patient is in a long term facility. The lady's name is Apolonio Schneiders and she is inquiring about a Albuterol Nebulizer Solution that was given to the patient in the hospital on July 16th, 2018. She stated that she is trying to process this with the insurance and it is asking her this question. She would like a clinical person to call her back @ [phone]

## 2018-08-31 DIAGNOSIS — M545 Low back pain: Secondary | ICD-10-CM | POA: Diagnosis not present

## 2018-08-31 DIAGNOSIS — M4725 Other spondylosis with radiculopathy, thoracolumbar region: Secondary | ICD-10-CM | POA: Diagnosis not present

## 2018-08-31 NOTE — Telephone Encounter (Signed)
Left message for Troy Lopez to call back.

## 2018-08-31 NOTE — Telephone Encounter (Signed)
Apolonio Schneiders had questions regarding albuterol neb treatment patient was on in the hospital and if pt was in long term care  facility. Advised that pt still lives at home. Apolonio Schneiders stated nothing further is needed.

## 2018-09-08 DIAGNOSIS — M4725 Other spondylosis with radiculopathy, thoracolumbar region: Secondary | ICD-10-CM | POA: Diagnosis not present

## 2018-09-08 DIAGNOSIS — M545 Low back pain: Secondary | ICD-10-CM | POA: Diagnosis not present

## 2018-09-11 DIAGNOSIS — M4725 Other spondylosis with radiculopathy, thoracolumbar region: Secondary | ICD-10-CM | POA: Diagnosis not present

## 2018-09-11 DIAGNOSIS — M545 Low back pain: Secondary | ICD-10-CM | POA: Diagnosis not present

## 2018-09-16 DIAGNOSIS — M545 Low back pain: Secondary | ICD-10-CM | POA: Diagnosis not present

## 2018-09-16 DIAGNOSIS — M4725 Other spondylosis with radiculopathy, thoracolumbar region: Secondary | ICD-10-CM | POA: Diagnosis not present

## 2018-09-18 DIAGNOSIS — M545 Low back pain: Secondary | ICD-10-CM | POA: Diagnosis not present

## 2018-09-18 DIAGNOSIS — M4725 Other spondylosis with radiculopathy, thoracolumbar region: Secondary | ICD-10-CM | POA: Diagnosis not present

## 2018-09-18 NOTE — Progress Notes (Signed)
[facility]  [phone] [phone]  ID: Troy Lopez OB: 25-Feb-1941  MR#: [id number]  DUK#:[id number]  Patient Care Team: Einar Pheasant, MD as PCP - General (Internal Medicine)  CHIEF COMPLAINT: [name]'s macroglobulinemia, iron deficiency anemia.  INTERVAL HISTORY: Patient returns to clinic today for repeat laboratory work, further evaluation, and consideration of additional IV Feraheme.  He continues to have chronic weakness and fatigue.  He is noted increasing shortness of breath and dyspnea on exertion over the past several weeks.  He otherwise feels well.  He has no neurologic complaints. He denies any recent fevers or illnesses.  He has a good appetite and denies weight loss.  He denies any chest pain, cough, or hemoptysis.  He denies any nausea, vomiting, constipation, or diarrhea.  He denies any melena or hematochezia.  He has no urinary complaints.  Patient offers no further specific complaints today.   REVIEW OF SYSTEMS:   Review of Systems  Constitutional: Positive for malaise/fatigue. Negative for fever and weight loss.  Respiratory: Positive for shortness of breath. Negative for cough and hemoptysis.   Cardiovascular: Negative.  Negative for chest pain and leg swelling.  Gastrointestinal: Negative.  Negative for abdominal pain, blood in stool, diarrhea and melena.  Genitourinary: Negative.  Negative for hematuria.  Musculoskeletal: Negative.  Negative for back pain.  Skin: Negative.  Negative for rash.  Neurological: Positive for weakness. Negative for sensory change and focal weakness.  Psychiatric/Behavioral: Negative.  The patient is not nervous/anxious.     As per HPI. Otherwise, a complete review of systems is negative.  PAST MEDICAL HISTORY: Past Medical History:  Diagnosis Date  . Adrenal gland anomaly    enlargement  . Anxiety   . CAD (coronary artery disease)   . Carpal tunnel syndrome   . Chronic hyponatremia    . Colitis   . Colonic polyp   . COPD (chronic obstructive pulmonary disease) ([place])   . Degenerative disc disease, lumbar   . Depression   . Diverticulosis   . Dyspnea   . GERD (gastroesophageal reflux disease)   . H/O degenerative disc disease   . Hypercholesterolemia   . Hyperkalemia   . Hyperlipidemia   . Hypertension   . Irritable bowel syndrome   . Monoclonal gammopathy   . Monoclonal gammopathy   . Neuropathy   . Personal history of tobacco use, presenting hazards to health 08/17/2015  . Sleep apnea   . Vertebral compression fracture ([place])     PAST SURGICAL HISTORY: Past Surgical History:  Procedure Laterality Date  . BUNIONECTOMY  1989  . CARPAL TUNNEL RELEASE Left 2011   ulnar nerve sub muscular at elbow  . CHOLECYSTECTOMY  09/07  . COLONOSCOPY WITH PROPOFOL N/A 03/05/2017   Procedure: COLONOSCOPY WITH PROPOFOL;  Surgeon: Lucilla Lame, MD;  Location[facility] ENDOSCOPY;  Service: Endoscopy;  Laterality: N/A;  . ELECTROPHYSIOLOGIC STUDY N/A 11/15/2015   Procedure: CARDIOVERSION;  Surgeon: Isaias Cowman, MD;  Location: [facility] ORS;  Service: Cardiovascular;  Laterality: N/A;  . ESOPHAGOGASTRODUODENOSCOPY (EGD) WITH PROPOFOL N/A 03/05/2017   Procedure: ESOPHAGOGASTRODUODENOSCOPY (EGD) WITH PROPOFOL;  Surgeon: Lucilla Lame, MD;  Location: [facility] ENDOSCOPY;  Service: Endoscopy;  Laterality: N/A;  . ESOPHAGOGASTRODUODENOSCOPY (EGD) WITH PROPOFOL N/A 10/17/2017   Procedure: ESOPHAGOGASTRODUODENOSCOPY (EGD) WITH PROPOFOL;  Surgeon: Lollie Sails, MD;  Location: [facility] ENDOSCOPY;  Service: Endoscopy;  Laterality: N/A;  . EYE SURGERY Bilateral 2010   cataract  . HEMORRHOID SURGERY    . KYPHOPLASTY N/A 11/04/2016   Procedure:  KYPHOPLASTY T 12;  Surgeon: Hessie Knows, MD;  Location: [facility] ORS;  Service: Orthopedics;  Laterality: N/A;    FAMILY HISTORY Family History  Problem Relation Age of Onset  . Stroke Mother   . Heart disease Father        MI - 26   . Colon cancer Neg Hx    . Prostate cancer Neg Hx        ADVANCED DIRECTIVES:    HEALTH MAINTENANCE: Social History   Tobacco Use  . Smoking status: Former Smoker    Types: Cigarettes    Last attempt to quit: 11/01/2015    Years since quitting: 2.9  . Smokeless tobacco: Never Used  . Tobacco comment: about 2 cigarettes per day, trying to quit  Substance Use Topics  . Alcohol use: Yes    Alcohol/week: 42.0 standard drinks    Types: 42 Cans of beer per week    Comment: occas  . Drug use: No     Colonoscopy:  PAP:  Bone density:  Lipid panel:  Allergies  Allergen Reactions  . Iodinated Diagnostic Agents Other (See Comments)    Contraindication secondary to IGMgammopathy/Waldren's syndrome   Not to be given due to Waldenstrom's syndrome, told it could affect kidney function    Current Outpatient Medications  Medication Sig Dispense Refill  . acetaminophen (TYLENOL) 500 MG tablet Take 500-1,000 mg by mouth 3 (three) times daily as needed for moderate pain or headache.    . albuterol (PROVENTIL HFA;VENTOLIN HFA) 108 (90 Base) MCG/ACT inhaler Inhale 2 puffs into the lungs every 6 (six) hours as needed for wheezing or shortness of breath. 1 Inhaler 10  . alendronate (FOSAMAX) 70 MG tablet Take 70 mg by mouth once a week. Take with a full glass of water on an empty stomach. Takes on Sundays    . amLODipine (NORVASC) 10 MG tablet Take 1 tablet (10 mg total) by mouth daily. 30 tablet 3  . azelastine (ASTELIN) 0.1 % nasal spray Place 1 spray into both nostrils 2 (two) times daily. Use in each nostril as directed 30 mL 1  . budesonide (ENTOCORT EC) 3 MG 24 hr capsule Take 9 mg by mouth daily as needed (colitis flare).     . calcium-vitamin D (OSCAL WITH D) 500-200 MG-UNIT tablet Take 2 tablets by mouth daily.    Marland Kitchen docusate sodium (COLACE) 100 MG capsule     . ELIQUIS 5 MG TABS tablet Take 5 mg by mouth 2 (two) times daily.     Marland Kitchen FIBER PO Take 2 capsules by mouth daily.    . fluticasone (FLONASE) 50  MCG/ACT nasal spray Place 2 sprays into both nostrils daily. 16 g 0  . fluticasone furoate-vilanterol (BREO ELLIPTA) 100-25 MCG/INH AEPB Inhale 1 puff into the lungs daily. 180 each 3  . furosemide (LASIX) 20 MG tablet Take 1 tablet (20 mg total) by mouth daily as needed. (Patient taking differently: Take 40 mg by mouth daily. ) 30 tablet 0  . gabapentin (NEURONTIN) 100 MG capsule Take 4 capsules (400 mg) daily in the morning and midday. Take 8 capsules (800 mg) daily in the evening. 480 capsule 1  . isosorbide mononitrate (IMDUR) 30 MG 24 hr tablet Take 1 tablet (30 mg total) by mouth once daily.    . metoprolol tartrate (LOPRESSOR) 100 MG tablet Take 1 tablet (100 mg total) by mouth 2 (two) times daily. (Patient taking differently: Take 50 mg by mouth 2 (two) times daily. ) 60 tablet  2  . mupirocin ointment (BACTROBAN) 2 % Apply to affected area bid. 22 g 0  . pantoprazole (PROTONIX) 40 MG tablet Take 1 tablet (40 mg total) by mouth 2 (two) times daily. 180 tablet 0  . promethazine (PHENERGAN) 12.5 MG tablet Take 12.5 mg by mouth every 6 (six) hours as needed for nausea or vomiting.    . sertraline (ZOLOFT) 50 MG tablet Take 1 tablet (50 mg total) by mouth daily. 30 tablet 0  . vitamin B-12 (CYANOCOBALAMIN) 1000 MCG tablet Take 1,000 mcg by mouth daily.      No current facility-administered medications for this visit.    Facility-Administered Medications Ordered in Other Visits  Medication Dose Route Frequency Provider Last Rate Last Dose  . ferumoxytol (FERAHEME) 510 mg in sodium chloride 0.9 % 100 mL IVPB  510 mg Intravenous Once Lloyd Huger, MD 468 mL/hr at 09/25/18 1102 510 mg at 09/25/18 1102    OBJECTIVE: Vitals:   09/25/18 1033  BP: (!) 144/70  Pulse: 65  Resp: 20  Temp: 98.4 F (36.9 C)  SpO2: 94%     Body mass index is 26.64 kg/m.    ECOG FS:0 - Asymptomatic  General: Well-developed, well-nourished, no acute distress. Eyes: Pink conjunctiva, anicteric  sclera. HEENT: Normocephalic, moist mucous membranes. Lungs: Clear to auscultation bilaterally. Heart: Regular rate and rhythm. No rubs, murmurs, or gallops. Abdomen: Soft, nontender, nondistended. No organomegaly noted, normoactive bowel sounds. Musculoskeletal: No edema, cyanosis, or clubbing. Neuro: Alert, answering all questions appropriately. Cranial nerves grossly intact. Skin: No rashes or petechiae noted. Psych: Normal affect.  LAB RESULTS:  Lab Results  Component Value Date   NA 128 (L) 09/21/2018   K 4.0 09/21/2018   CL 94 (L) 09/21/2018   CO2 22 09/21/2018   GLUCOSE 99 09/21/2018   BUN 13 09/21/2018   CREATININE 0.98 09/21/2018   CALCIUM 9.2 09/21/2018   PROT 7.9 02/18/2018   ALBUMIN 3.3 (L) 02/18/2018   AST 21 02/18/2018   ALT 11 02/18/2018   ALKPHOS 48 02/18/2018   BILITOT 0.7 02/18/2018   GFRNONAA >60 09/21/2018   GFRAA >60 09/21/2018    Lab Results  Component Value Date   WBC 7.5 09/21/2018   NEUTROABS 5.1 09/21/2018   HGB 10.2 (L) 09/21/2018   HCT 30.1 (L) 09/21/2018   MCV 103.8 (H) 09/21/2018   PLT 380 09/21/2018   Lab Results  Component Value Date   IRON 73 09/21/2018   TIBC 1,022 (H) 09/21/2018   IRONPCTSAT 7 (L) 09/21/2018    Lab Results  Component Value Date   FERRITIN 29 09/21/2018   Lab Results  Component Value Date   TOTALPROTELP 8.0 09/21/2018   ALBUMINELP 3.4 09/21/2018   A1GS 0.3 09/21/2018   A2GS 0.7 09/21/2018   BETS 1.2 09/21/2018   BETA2SER 0.2 02/14/2016   GAMS 2.3 (H) 09/21/2018   MSPIKE 2.0 (H) 09/21/2018   SPEI Comment 09/21/2018    STUDIES: No results found.  ASSESSMENT: [name]'s macroglobulinemia, iron deficiency anemia.  PLAN:    1. [name]'s macroglobulinemia:  Bone marrow biopsy results from Jan 08, 2017 reviewed independently confirming underlying [name]'s.  Patient's M spike has been erratic ranging from 1.1 in May 2018 to as high as 2.6 in October 2018.  Today's result is 2.0.  IgM levels  remain persistently elevated greater than 3000.  Most recent result was 3380.  Patient was noted to have an IgM level of 1745 approximately 4 months ago, but this was likely falsely low.  His kappa/lambda light change have ranged from 4.16 to 5.71.  He has persistent iron deficiency anemia from a GI source, but no other evidence of endorgan damage. He does not require any treatment at this time. If he had progression of disease as evidenced by B-symptoms or endorgan damage, will consider Rituxan-based therapy.  Continue to monitor laboratory work every 3 to 6 months.   2. 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  His hemoglobin and iron stores remain persistently decreased and patient is symptomatic.  Proceed with 510 mg IV Feraheme today.  Return to clinic in 1 week for second infusion.  Patient will then return to clinic in 3 months with repeat laboratory work and further evaluation.  3.  Back pain: Chronic and unchanged.  Continue monitoring and treatment per pain clinic.  I spent a total of 30 minutes face-to-face with the patient of which greater than 50% of the visit was spent in counseling and coordination of care as detailed above.   Patient expressed understanding and was in agreement with this plan. He also understands that He can call clinic at any time with any questions, concerns, or complaints.    Lloyd Huger, MD   09/25/2018 11:09 AM

## 2018-09-21 ENCOUNTER — Other Ambulatory Visit: Payer: Self-pay | Admitting: Internal Medicine

## 2018-09-21 ENCOUNTER — Inpatient Hospital Stay: Payer: PPO | Attending: Oncology

## 2018-09-21 DIAGNOSIS — D509 Iron deficiency anemia, unspecified: Secondary | ICD-10-CM | POA: Diagnosis not present

## 2018-09-21 DIAGNOSIS — C880 Waldenstrom macroglobulinemia: Secondary | ICD-10-CM

## 2018-09-21 DIAGNOSIS — Z79899 Other long term (current) drug therapy: Secondary | ICD-10-CM | POA: Diagnosis not present

## 2018-09-21 LAB — IRON AND TIBC
Iron: 73 ug/dL (ref 45–182)
Saturation Ratios: 7 % — ABNORMAL LOW (ref 17.9–39.5)
TIBC: 1022 ug/dL — ABNORMAL HIGH (ref 250–450)
UIBC: 949 ug/dL

## 2018-09-21 LAB — BASIC METABOLIC PANEL
Anion gap: 12 (ref 5–15)
BUN: 13 mg/dL (ref 8–23)
CHLORIDE: 94 mmol/L — AB (ref 98–111)
CO2: 22 mmol/L (ref 22–32)
CREATININE: 0.98 mg/dL (ref 0.61–1.24)
Calcium: 9.2 mg/dL (ref 8.9–10.3)
Glucose, Bld: 99 mg/dL (ref 70–99)
POTASSIUM: 4 mmol/L (ref 3.5–5.1)
Sodium: 128 mmol/L — ABNORMAL LOW (ref 135–145)

## 2018-09-21 LAB — CBC WITH DIFFERENTIAL/PLATELET
Abs Immature Granulocytes: 0.03 10*3/uL (ref 0.00–0.07)
BASOS ABS: 0.1 10*3/uL (ref 0.0–0.1)
BASOS PCT: 1 %
EOS PCT: 1 %
Eosinophils Absolute: 0.1 10*3/uL (ref 0.0–0.5)
HCT: 30.1 % — ABNORMAL LOW (ref 39.0–52.0)
HEMOGLOBIN: 10.2 g/dL — AB (ref 13.0–17.0)
Immature Granulocytes: 0 %
LYMPHS PCT: 20 %
Lymphs Abs: 1.5 10*3/uL (ref 0.7–4.0)
MCH: 35.2 pg — AB (ref 26.0–34.0)
MCHC: 33.9 g/dL (ref 30.0–36.0)
MCV: 103.8 fL — ABNORMAL HIGH (ref 80.0–100.0)
MONO ABS: 0.7 10*3/uL (ref 0.1–1.0)
Monocytes Relative: 10 %
NRBC: 0 % (ref 0.0–0.2)
Neutro Abs: 5.1 10*3/uL (ref 1.7–7.7)
Neutrophils Relative %: 68 %
PLATELETS: 380 10*3/uL (ref 150–400)
RBC: 2.9 MIL/uL — AB (ref 4.22–5.81)
RDW: 14.4 % (ref 11.5–15.5)
WBC: 7.5 10*3/uL (ref 4.0–10.5)

## 2018-09-21 LAB — FERRITIN: FERRITIN: 29 ng/mL (ref 24–336)

## 2018-09-22 LAB — KAPPA/LAMBDA LIGHT CHAINS
Kappa free light chain: 66.6 mg/L — ABNORMAL HIGH (ref 3.3–19.4)
Kappa, lambda light chain ratio: 4.16 — ABNORMAL HIGH (ref 0.26–1.65)
LAMDA FREE LIGHT CHAINS: 16 mg/L (ref 5.7–26.3)

## 2018-09-22 LAB — IGG, IGA, IGM
IGM (IMMUNOGLOBULIN M), SRM: 3380 mg/dL — AB (ref 15–143)
IgA: 38 mg/dL — ABNORMAL LOW (ref 61–437)
IgG (Immunoglobin G), Serum: 464 mg/dL — ABNORMAL LOW (ref 700–1600)

## 2018-09-23 LAB — PROTEIN ELECTROPHORESIS, SERUM
A/G RATIO SPE: 0.7 (ref 0.7–1.7)
ALPHA-2-GLOBULIN: 0.7 g/dL (ref 0.4–1.0)
Albumin ELP: 3.4 g/dL (ref 2.9–4.4)
Alpha-1-Globulin: 0.3 g/dL (ref 0.0–0.4)
Beta Globulin: 1.2 g/dL (ref 0.7–1.3)
GLOBULIN, TOTAL: 4.6 g/dL — AB (ref 2.2–3.9)
Gamma Globulin: 2.3 g/dL — ABNORMAL HIGH (ref 0.4–1.8)
M-Spike, %: 2 g/dL — ABNORMAL HIGH
TOTAL PROTEIN ELP: 8 g/dL (ref 6.0–8.5)

## 2018-09-25 ENCOUNTER — Inpatient Hospital Stay: Payer: PPO | Attending: Oncology | Admitting: Oncology

## 2018-09-25 ENCOUNTER — Other Ambulatory Visit: Payer: Self-pay

## 2018-09-25 ENCOUNTER — Inpatient Hospital Stay: Payer: PPO

## 2018-09-25 ENCOUNTER — Encounter: Payer: Self-pay | Admitting: Oncology

## 2018-09-25 VITALS — BP 144/70 | HR 65 | Temp 98.4°F | Resp 20 | Wt 191.0 lb

## 2018-09-25 VITALS — BP 137/79 | HR 54 | Resp 20

## 2018-09-25 DIAGNOSIS — D509 Iron deficiency anemia, unspecified: Secondary | ICD-10-CM

## 2018-09-25 DIAGNOSIS — C880 Waldenstrom macroglobulinemia: Secondary | ICD-10-CM | POA: Insufficient documentation

## 2018-09-25 DIAGNOSIS — G8929 Other chronic pain: Secondary | ICD-10-CM | POA: Diagnosis not present

## 2018-09-25 DIAGNOSIS — Z87891 Personal history of nicotine dependence: Secondary | ICD-10-CM | POA: Insufficient documentation

## 2018-09-25 DIAGNOSIS — Z79899 Other long term (current) drug therapy: Secondary | ICD-10-CM | POA: Insufficient documentation

## 2018-09-25 DIAGNOSIS — I10 Essential (primary) hypertension: Secondary | ICD-10-CM | POA: Insufficient documentation

## 2018-09-25 DIAGNOSIS — Z7901 Long term (current) use of anticoagulants: Secondary | ICD-10-CM | POA: Diagnosis not present

## 2018-09-25 DIAGNOSIS — M549 Dorsalgia, unspecified: Secondary | ICD-10-CM | POA: Diagnosis not present

## 2018-09-25 MED ORDER — SODIUM CHLORIDE 0.9 % IV SOLN
510.0000 mg | Freq: Once | INTRAVENOUS | Status: AC
  Administered 2018-09-25: 510 mg via INTRAVENOUS
  Filled 2018-09-25: qty 17

## 2018-09-25 MED ORDER — SODIUM CHLORIDE 0.9 % IV SOLN
Freq: Once | INTRAVENOUS | Status: AC
  Administered 2018-09-25: 11:00:00 via INTRAVENOUS
  Filled 2018-09-25: qty 250

## 2018-09-25 NOTE — Patient Instructions (Signed)

## 2018-09-25 NOTE — Progress Notes (Signed)
Patient reports worsening shortness of breath, O2 sats 94% on room air in clinic today. Patient also reports he has no energy.

## 2018-09-28 DIAGNOSIS — M4725 Other spondylosis with radiculopathy, thoracolumbar region: Secondary | ICD-10-CM | POA: Diagnosis not present

## 2018-09-28 DIAGNOSIS — M545 Low back pain: Secondary | ICD-10-CM | POA: Diagnosis not present

## 2018-10-01 ENCOUNTER — Other Ambulatory Visit: Payer: Self-pay | Admitting: Internal Medicine

## 2018-10-02 ENCOUNTER — Inpatient Hospital Stay: Payer: PPO

## 2018-10-02 VITALS — BP 125/70 | HR 50 | Temp 96.8°F | Resp 20

## 2018-10-02 DIAGNOSIS — D509 Iron deficiency anemia, unspecified: Secondary | ICD-10-CM

## 2018-10-02 MED ORDER — SODIUM CHLORIDE 0.9 % IV SOLN
Freq: Once | INTRAVENOUS | Status: AC
  Administered 2018-10-02: 11:00:00 via INTRAVENOUS
  Filled 2018-10-02: qty 250

## 2018-10-02 MED ORDER — SODIUM CHLORIDE 0.9 % IV SOLN
510.0000 mg | Freq: Once | INTRAVENOUS | Status: AC
  Administered 2018-10-02: 510 mg via INTRAVENOUS
  Filled 2018-10-02: qty 17

## 2018-10-02 NOTE — Patient Instructions (Signed)

## 2018-10-06 ENCOUNTER — Ambulatory Visit (INDEPENDENT_AMBULATORY_CARE_PROVIDER_SITE_OTHER): Payer: PPO

## 2018-10-06 ENCOUNTER — Ambulatory Visit (INDEPENDENT_AMBULATORY_CARE_PROVIDER_SITE_OTHER): Payer: PPO | Admitting: Internal Medicine

## 2018-10-06 ENCOUNTER — Encounter: Payer: Self-pay | Admitting: Internal Medicine

## 2018-10-06 VITALS — BP 120/64 | HR 70 | Temp 98.0°F | Ht 71.0 in | Wt 190.2 lb

## 2018-10-06 DIAGNOSIS — J849 Interstitial pulmonary disease, unspecified: Secondary | ICD-10-CM

## 2018-10-06 DIAGNOSIS — R05 Cough: Secondary | ICD-10-CM

## 2018-10-06 DIAGNOSIS — J441 Chronic obstructive pulmonary disease with (acute) exacerbation: Secondary | ICD-10-CM

## 2018-10-06 DIAGNOSIS — R0602 Shortness of breath: Secondary | ICD-10-CM | POA: Diagnosis not present

## 2018-10-06 DIAGNOSIS — H40003 Preglaucoma, unspecified, bilateral: Secondary | ICD-10-CM | POA: Diagnosis not present

## 2018-10-06 DIAGNOSIS — R059 Cough, unspecified: Secondary | ICD-10-CM

## 2018-10-06 DIAGNOSIS — H26492 Other secondary cataract, left eye: Secondary | ICD-10-CM | POA: Diagnosis not present

## 2018-10-06 MED ORDER — IPRATROPIUM BROMIDE 0.02 % IN SOLN
0.5000 mg | Freq: Once | RESPIRATORY_TRACT | Status: AC
  Administered 2018-10-06: 0.5 mg via RESPIRATORY_TRACT

## 2018-10-06 MED ORDER — ALBUTEROL SULFATE (2.5 MG/3ML) 0.083% IN NEBU
2.5000 mg | INHALATION_SOLUTION | Freq: Once | RESPIRATORY_TRACT | Status: AC
  Administered 2018-10-06: 2.5 mg via RESPIRATORY_TRACT

## 2018-10-06 MED ORDER — PREDNISONE 20 MG PO TABS: 40.0000 mg | ORAL_TABLET | Freq: Every day | ORAL | 0 refills | Status: DC

## 2018-10-06 MED ORDER — HYDROCOD POLST-CPM POLST ER 10-8 MG/5ML PO SUER: 5.0000 mL | Freq: Every evening | ORAL | 0 refills | Status: DC | PRN

## 2018-10-06 MED ORDER — DOXYCYCLINE HYCLATE 100 MG PO TABS: 100.0000 mg | ORAL_TABLET | Freq: Two times a day (BID) | ORAL | 0 refills | Status: DC

## 2018-10-06 NOTE — Progress Notes (Signed)
Chief Complaint  Patient presents with  . Cough   Sick visit  1. C/o cough x 3-4 weeks not getting better not able to get phlegm up x 1 week using mucinex. He is not smoking. Also c/o sob with exertion worse x 1 month w/o wheezing. On breo and prn Albuterol w/o relief. H/o COPD per pt and noted CXR 06/09/18    Review of Systems  Constitutional: Negative for fever and weight loss.  HENT: Negative for hearing loss.   Eyes: Negative for blurred vision.  Respiratory: Positive for cough and shortness of breath. Negative for sputum production and wheezing.   Cardiovascular: Negative for chest pain.  Skin: Negative for rash.   Past Medical History:  Diagnosis Date  . Adrenal gland anomaly    enlargement  . Anxiety   . CAD (coronary artery disease)   . Carpal tunnel syndrome   . Chronic hyponatremia   . Colitis   . Colonic polyp   . COPD (chronic obstructive pulmonary disease) ([place])   . Degenerative disc disease, lumbar   . Depression   . Diverticulosis   . Dyspnea   . GERD (gastroesophageal reflux disease)   . H/O degenerative disc disease   . Hypercholesterolemia   . Hyperkalemia   . Hyperlipidemia   . Hypertension   . Irritable bowel syndrome   . Monoclonal gammopathy   . Monoclonal gammopathy   . Neuropathy   . Personal history of tobacco use, presenting hazards to health 08/17/2015  . Sleep apnea   . Vertebral compression fracture [facility])    Past Surgical History:  Procedure Laterality Date  . BUNIONECTOMY  1989  . CARPAL TUNNEL RELEASE Left 2011   ulnar nerve sub muscular at elbow  . CHOLECYSTECTOMY  09/07  . COLONOSCOPY WITH PROPOFOL N/A 03/05/2017   Procedure: COLONOSCOPY WITH PROPOFOL;  Surgeon: Lucilla Lame, MD;  [facility] END[facility]COPY;  Service: Endoscopy;  Laterality: N/A;  . ELECTROPHYSIOLOGIC STUDY N/A 11/15/2015   Procedure: CARDIOVERSION;  Surgeon: Isaias Cowman, MD;  Location: [facility] ORS;  Service: Cardiovascular;  Laterality: N/A;  .  ESOPHAGOGASTRODUODENOSCOPY (EGD) WITH PROPOFOL N/A 03/05/2017   Procedure: ESOPHAGOGASTRODUODENOSCOPY (EGD) WITH PROPOFOL;  Surgeon: Lucilla Lame, MD;  Location: [facility] ENDOSCOPY;  Service: Endoscopy;  Laterality: N/A;  . ESOPHAGOGASTRODUODENOSCOPY (EGD) WITH PROPOFOL N/A 10/17/2017   Procedure: ESOPHAGOGASTRODUODENOSCOPY (EGD) WITH PROPOFOL;  Surgeon: Lollie Sails, MD;  Location: [facility] ENDOSCOPY;  Service: Endoscopy;  Laterality: N/A;  . EYE SURGERY Bilateral 2010   cataract  . HEMORRHOID SURGERY    . KYPHOPLASTY N/A 11/04/2016   Procedure: KYPHOPLASTY T 12;  Surgeon: Hessie Knows, MD;  Location: [facility] ORS;  Service: Orthopedics;  Laterality: N/A;   Family History  Problem Relation Age of Onset  . Stroke Mother   . Heart disease Father        MI - 26   . Colon cancer Neg Hx   . Prostate cancer Neg Hx    Social History   Socioeconomic History  . Marital status: Married    Spouse name: Not on file  . Number of children: 3  . Years of education: Not on file  . Highest education level: Not on file  Occupational History  . Not on file  Social Needs  . Financial resource strain: Not on file  . Food insecurity:    Worry: Not on file    Inability: Not on file  . Transportation needs:    Medical: Not on file    Non-medical: Not on  file  Tobacco Use  . Smoking status: Former Smoker    Types: Cigarettes    Last attempt to quit: 11/01/2015    Years since quitting: 2.9  . Smokeless tobacco: Never Used  . Tobacco comment: about 2 cigarettes per day, trying to quit  Substance and Sexual Activity  . Alcohol use: Yes    Alcohol/week: 42.0 standard drinks    Types: 42 Cans of beer per week    Comment: occas  . Drug use: No  . Sexual activity: Not on file  Lifestyle  . Physical activity:    Days per week: Not on file    Minutes per session: Not on file  . Stress: Not on file  Relationships  . Social connections:    Talks on phone: Not on file    Gets together: Not on file     Attends religious service: Not on file    Active member of club or organization: Not on file    Attends meetings of clubs or organizations: Not on file    Relationship status: Not on file  . Intimate partner violence:    Fear of current or ex partner: Not on file    Emotionally abused: Not on file    Physically abused: Not on file    Forced sexual activity: Not on file  Other Topics Concern  . Not on file  Social History Narrative  . Not on file   Current Meds  Medication Sig  . acetaminophen (TYLENOL) 500 MG tablet Take 500-1,000 mg by mouth 3 (three) times daily as needed for moderate pain or headache.  . albuterol (PROVENTIL HFA;VENTOLIN HFA) 108 (90 Base) MCG/ACT inhaler Inhale 2 puffs into the lungs every 6 (six) hours as needed for wheezing or shortness of breath.  Marland Kitchen alendronate (FOSAMAX) 70 MG tablet Take 70 mg by mouth once a week. Take with a full glass of water on an empty stomach. Takes on Sundays  . amLODipine (NORVASC) 10 MG tablet Take 1 tablet (10 mg total) by mouth daily.  Marland Kitchen azelastine (ASTELIN) 0.1 % nasal spray Place 1 spray into both nostrils 2 (two) times daily. Use in each nostril as directed  . budesonide (ENTOCORT EC) 3 MG 24 hr capsule Take 9 mg by mouth daily as needed (colitis flare).   . calcium-vitamin D (OSCAL WITH D) 500-200 MG-UNIT tablet Take 2 tablets by mouth daily.  Marland Kitchen docusate sodium (COLACE) 100 MG capsule   . ELIQUIS 5 MG TABS tablet Take 5 mg by mouth 2 (two) times daily.   Marland Kitchen FIBER PO Take 2 capsules by mouth daily.  . fluticasone (FLONASE) 50 MCG/ACT nasal spray Place 2 sprays into both nostrils daily.  . fluticasone furoate-vilanterol (BREO ELLIPTA) 100-25 MCG/INH AEPB Inhale 1 puff into the lungs daily.  . furosemide (LASIX) 20 MG tablet Take 1 tablet (20 mg total) by mouth daily as needed. (Patient taking differently: Take 40 mg by mouth daily. )  . gabapentin (NEURONTIN) 100 MG capsule Take 4 capsules (400 mg) daily in the morning and midday.  Take 8 capsules (800 mg) daily in the evening.  . isosorbide mononitrate (IMDUR) 30 MG 24 hr tablet Take 1 tablet (30 mg total) by mouth once daily.  . metoprolol tartrate (LOPRESSOR) 100 MG tablet Take 1 tablet (100 mg total) by mouth 2 (two) times daily. (Patient taking differently: Take 50 mg by mouth 2 (two) times daily. )  . mupirocin ointment (BACTROBAN) 2 % Apply to affected area bid.  Marland Kitchen  pantoprazole (PROTONIX) 40 MG tablet Take 1 tablet (40 mg total) by mouth 2 (two) times daily.  . promethazine (PHENERGAN) 12.5 MG tablet Take 12.5 mg by mouth every 6 (six) hours as needed for nausea or vomiting.  . sertraline (ZOLOFT) 50 MG tablet Take 1 tablet (50 mg total) by mouth daily.  . vitamin B-12 (CYANOCOBALAMIN) 1000 MCG tablet Take 1,000 mcg by mouth daily.    Allergies  Allergen Reactions  . 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 Comment     Comment: (NOTE) The SPE pattern demonstrates a single peak (M-spike) in the gamma region which may represent monoclonal protein. This peak may also be caused by circulating immune complexes, cryoglobulins, C-reactive protein, fibrinogen or hemolysis.  If clinically indicated, the presence of a monoclonal gammopathy may be confirmed by immuno- fixation, as well as an evaluation of the urine for the presence of Bence-Jones protein. Performed At: [facility] [place], [place] [id number] Rush Farmer MD ID:[id number]    Comment Comment     Comment: (NOTE) Protein electrophoresis scan will follow via computer, mail, or courier delivery.    Globulin, Total 4.6 (H) 2.2 - 3.9 g/dL   A/G Ratio 0.7 0.7 - 1.7  Kappa/lambda light chains     Status: Abnormal   Collection Time: 09/21/18 11:07 AM  Result Value Ref Range   Kappa free light chain 66.6 (H) 3.3 - 19.4 mg/L   Lamda free light chains 16.0 5.7 - 26.3 mg/L   Kappa, lamda light chain ratio 4.16 (H) 0.26 - 1.65    Comment: (NOTE) Performed At: [facility] [place], [place] [id number] Rush Farmer MD QP:[id number]   IgG, IgA, IgM     Status: Abnormal   Collection Time: 09/21/18 11:07 AM  Result Value Ref Range   IgG (Immunoglobin G), Serum 464 (L) 700 - 1,600 mg/dL   IgA 38 (L) 61 - 437 mg/dL    Comment: Result confirmed on concentration.   IgM (Immunoglobulin M), Srm 3,380 (H) 15 - 143 mg/dL    Comment: (NOTE) Results confirmed on dilution. Performed At: [facility] [place], [place] [id number] Rush Farmer MD PJ:[id number]   Iron and TIBC     Status: Abnormal   Collection Time: 09/21/18 11:07 AM  Result Value Ref Range   Iron 73 45 - 182 ug/dL   TIBC 1,022 (H) 250 - 450 ug/dL   Saturation Ratios 7 (L) 17.9 - 39.5 %   UIBC 949 ug/dL    Comment: Performed at [facility], [address] [place], [place] [address]  Ferritin     Status: None   Collection Time: 09/21/18 11:07 AM  Result Value Ref Range   Ferritin 29 24 - 336 ng/mL  Comment: Performed at [facility], [address] [place], [place] [address]  Basic metabolic panel     Status: Abnormal   Collection Time: 09/21/18 11:07 AM  Result Value Ref Range   Sodium 128 (L) 135 - 145 mmol/L   Potassium 4.0 3.5 - 5.1 mmol/L   Chloride 94 (L) 98 - 111 mmol/L   CO2 22 22 - 32 mmol/L   Glucose, Bld 99 70 - 99 mg/dL    BUN 13 8 - 23 mg/dL   Creatinine, Ser 0.98 0.61 - 1.24 mg/dL   Calcium 9.2 8.9 - 10.3 mg/dL   GFR calc non Af Amer >60 >60 mL/min   GFR calc Af Amer >60 >60 mL/min   Anion gap 12 5 - 15    Comment: Performed at [facility], [address], [place], [place] [address]  CBC with Differential/Platelet     Status: Abnormal   Collection Time: 09/21/18 11:07 AM  Result Value Ref Range   WBC 7.5 4.0 - 10.5 K/uL   RBC 2.90 (L) 4.22 - 5.81 MIL/uL   Hemoglobin 10.2 (L) 13.0 - 17.0 g/dL   HCT 30.1 (L) 39.0 - 52.0 %   MCV 103.8 (H) 80.0 - 100.0 fL   MCH 35.2 (H) 26.0 - 34.0 pg   MCHC 33.9 30.0 - 36.0 g/dL   RDW 14.4 11.5 - 15.5 %   Platelets 380 150 - 400 K/uL   nRBC 0.0 0.0 - 0.2 %   Neutrophils Relative % 68 %   Neutro Abs 5.1 1.7 - 7.7 K/uL   Lymphocytes Relative 20 %   Lymphs Abs 1.5 0.7 - 4.0 K/uL   Monocytes Relative 10 %   Monocytes Absolute 0.7 0.1 - 1.0 K/uL   Eosinophils Relative 1 %   Eosinophils Absolute 0.1 0.0 - 0.5 K/uL   Basophils Relative 1 %   Basophils Absolute 0.1 0.0 - 0.1 K/uL   Immature Granulocytes 0 %   Abs Immature Granulocytes 0.03 0.00 - 0.07 K/uL    Comment: Performed at [facility], [address] [place], [place] [address]   Objective  Body mass index is 26.53 kg/m. Wt Readings from Last 3 Encounters:  10/06/18 190 lb 3.2 oz (86.3 kg)  09/25/18 191 lb (86.6 kg)  07/07/18 190 lb 9.6 oz (86.5 kg)   Temp Readings from Last 3 Encounters:  10/06/18 98 F (36.7 C) (Oral)  10/02/18 (!) 96.8 F (36 C) (Tympanic)  09/25/18 98.4 F (36.9 C) (Tympanic)   BP Readings from Last 3 Encounters:  10/06/18 120/64  10/02/18 125/70  09/25/18 137/79   Pulse Readings from Last 3 Encounters:  10/06/18 70  10/02/18 (!) 50  09/25/18 (!) 54    Physical Exam Vitals signs and nursing note reviewed.  Constitutional:      Appearance: Normal appearance. He is well-developed and well-groomed.  HENT:     Head: Normocephalic and  atraumatic.     Nose: Nose normal.     Mouth/Throat:     Mouth: Mucous membranes are moist.     Pharynx: Oropharynx is clear.  Eyes:     Conjunctiva/sclera: Conjunctivae normal.     Pupils: Pupils are equal, round, and reactive to light.  Cardiovascular:     Rate and Rhythm: Normal rate and regular rhythm.     Heart sounds: Normal heart sounds. No murmur.  Pulmonary:     Effort: Pulmonary effort is normal.     Breath sounds: Normal breath  sounds. No wheezing.  Skin:    General: Skin is warm and dry.  Neurological:     General: No focal deficit present.     Mental Status: He is alert and oriented to person, place, and time. Mental status is at baseline.     Gait: Gait normal.  Psychiatric:        Attention and Perception: Attention and perception normal.        Mood and Affect: Mood and affect normal.        Speech: Speech normal.        Behavior: Behavior normal. Behavior is cooperative.        Thought Content: Thought content normal.        Cognition and Memory: Cognition and memory normal.        Judgment: Judgment normal.     Assessment   1. 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 Given sample trelegy hold breo for now  duoneb x 1 today  Doxy bid x 1 week and prednisone 40 mg qam x 1 week  Tussionex prn rec mucinex green label during the day  CXR though consider CT chest in future none since 03/26/17 F/u with PCP as ch   Provider: Dr. Olivia Mackie McLean-Scocuzza-Internal Medicine

## 2018-10-06 NOTE — Progress Notes (Signed)
Pre visit review using our clinic review tool, if applicable. No additional management support is needed unless otherwise documented below in the visit note. 

## 2018-10-06 NOTE — Patient Instructions (Addendum)
Stop Breo for now and try Trelegy call back if you would like a refill of this   Chronic Obstructive Pulmonary Disease Exacerbation Chronic obstructive pulmonary disease (COPD) is a long-term (chronic) lung problem. In COPD, the flow of air from the lungs is limited. COPD exacerbations are times that breathing gets worse and you need more than your normal treatment. Without treatment, they can be life threatening. If they happen often, your lungs can become more damaged. If your COPD gets worse, your doctor may treat you with:  Medicines.  Oxygen.  Different ways to clear your airway, such as using a mask. Follow these instructions at home: Medicines  Take over-the-counter and prescription medicines only as told by your doctor.  If you take an antibiotic or steroid medicine, do not stop taking the medicine even if you start to feel better.  Keep up with shots (vaccinations) as told by your doctor. Be sure to get a yearly (annual) flu shot. Lifestyle  Do not smoke. If you need help quitting, ask your doctor.  Eat healthy foods.  Exercise regularly.  Get plenty of sleep.  Avoid tobacco smoke and other things that can bother your lungs.  Wash your hands often with soap and water. This will help keep you from getting an infection. If you cannot use soap and water, use hand sanitizer.  During flu season, avoid areas that are crowded with people. General instructions  Drink enough fluid to keep your pee (urine) clear or pale yellow. Do not do this if your doctor has told you not to.  Use a cool mist machine (vaporizer).  If you use oxygen or a machine that turns medicine into a mist (nebulizer), continue to use it as told.  Follow all instructions for rehabilitation. These are steps you can take to make your body work better.  Keep all follow-up visits as told by your doctor. This is important. Contact a doctor if:  Your COPD symptoms get worse than normal. Get help right  away if:  You are short of breath and it gets worse.  You have trouble talking.  You have chest pain.  You cough up blood.  You have a fever.  You keep throwing up (vomiting).  You feel weak or you pass out (faint).  You feel confused.  You are not able to sleep because of your symptoms.  You are not able to do daily activities. Summary  COPD exacerbations are times that breathing gets worse and you need more treatment than normal.  COPD exacerbations can be very serious and may cause your lungs to become more damaged.  Do not smoke. If you need help quitting, ask your doctor.  Stay up-to-date on your shots. Get a flu shot every year. This information is not intended to replace advice given to you by your health care provider. Make sure you discuss any questions you have with your health care provider. Document Released: 07/25/2011 Document Revised: 09/09/2016 Document Reviewed: 09/09/2016 Elsevier Interactive Patient Education  2019 [employer].

## 2018-10-12 DIAGNOSIS — Z720 Tobacco use: Secondary | ICD-10-CM | POA: Diagnosis not present

## 2018-10-12 DIAGNOSIS — E7800 Pure hypercholesterolemia, unspecified: Secondary | ICD-10-CM | POA: Diagnosis not present

## 2018-10-12 DIAGNOSIS — I2510 Atherosclerotic heart disease of native coronary artery without angina pectoris: Secondary | ICD-10-CM | POA: Diagnosis not present

## 2018-10-12 DIAGNOSIS — J410 Simple chronic bronchitis: Secondary | ICD-10-CM | POA: Diagnosis not present

## 2018-10-12 DIAGNOSIS — R0602 Shortness of breath: Secondary | ICD-10-CM | POA: Diagnosis not present

## 2018-10-12 DIAGNOSIS — R609 Edema, unspecified: Secondary | ICD-10-CM | POA: Diagnosis not present

## 2018-10-12 DIAGNOSIS — I272 Pulmonary hypertension, unspecified: Secondary | ICD-10-CM | POA: Diagnosis not present

## 2018-10-12 DIAGNOSIS — I10 Essential (primary) hypertension: Secondary | ICD-10-CM | POA: Diagnosis not present

## 2018-10-12 DIAGNOSIS — G4733 Obstructive sleep apnea (adult) (pediatric): Secondary | ICD-10-CM | POA: Diagnosis not present

## 2018-10-12 DIAGNOSIS — R0789 Other chest pain: Secondary | ICD-10-CM | POA: Diagnosis not present

## 2018-10-12 DIAGNOSIS — Z9889 Other specified postprocedural states: Secondary | ICD-10-CM | POA: Diagnosis not present

## 2018-10-29 ENCOUNTER — Other Ambulatory Visit: Payer: Self-pay

## 2018-10-29 ENCOUNTER — Ambulatory Visit (INDEPENDENT_AMBULATORY_CARE_PROVIDER_SITE_OTHER): Payer: PPO | Admitting: Internal Medicine

## 2018-10-29 ENCOUNTER — Encounter: Payer: Self-pay | Admitting: Internal Medicine

## 2018-10-29 VITALS — BP 118/68 | HR 64 | Temp 97.4°F | Resp 16 | Ht 71.0 in | Wt 189.8 lb

## 2018-10-29 DIAGNOSIS — I4892 Unspecified atrial flutter: Secondary | ICD-10-CM | POA: Diagnosis not present

## 2018-10-29 DIAGNOSIS — E785 Hyperlipidemia, unspecified: Secondary | ICD-10-CM

## 2018-10-29 DIAGNOSIS — I2510 Atherosclerotic heart disease of native coronary artery without angina pectoris: Secondary | ICD-10-CM

## 2018-10-29 DIAGNOSIS — D472 Monoclonal gammopathy: Secondary | ICD-10-CM

## 2018-10-29 DIAGNOSIS — I272 Pulmonary hypertension, unspecified: Secondary | ICD-10-CM

## 2018-10-29 DIAGNOSIS — J449 Chronic obstructive pulmonary disease, unspecified: Secondary | ICD-10-CM

## 2018-10-29 DIAGNOSIS — K52831 Collagenous colitis: Secondary | ICD-10-CM | POA: Diagnosis not present

## 2018-10-29 DIAGNOSIS — R739 Hyperglycemia, unspecified: Secondary | ICD-10-CM

## 2018-10-29 DIAGNOSIS — G8929 Other chronic pain: Secondary | ICD-10-CM

## 2018-10-29 DIAGNOSIS — M549 Dorsalgia, unspecified: Secondary | ICD-10-CM

## 2018-10-29 DIAGNOSIS — I10 Essential (primary) hypertension: Secondary | ICD-10-CM

## 2018-10-29 DIAGNOSIS — K219 Gastro-esophageal reflux disease without esophagitis: Secondary | ICD-10-CM

## 2018-10-29 DIAGNOSIS — C880 Waldenstrom macroglobulinemia: Secondary | ICD-10-CM

## 2018-10-29 DIAGNOSIS — E871 Hypo-osmolality and hyponatremia: Secondary | ICD-10-CM | POA: Diagnosis not present

## 2018-10-29 DIAGNOSIS — Z0000 Encounter for general adult medical examination without abnormal findings: Secondary | ICD-10-CM

## 2018-10-29 DIAGNOSIS — M79606 Pain in leg, unspecified: Secondary | ICD-10-CM

## 2018-10-29 DIAGNOSIS — G4730 Sleep apnea, unspecified: Secondary | ICD-10-CM

## 2018-10-29 DIAGNOSIS — D509 Iron deficiency anemia, unspecified: Secondary | ICD-10-CM

## 2018-10-29 DIAGNOSIS — I712 Thoracic aortic aneurysm, without rupture, unspecified: Secondary | ICD-10-CM

## 2018-10-29 NOTE — Progress Notes (Signed)
Patient ID: SAMAEL BLADES, male   DOB: [birth year], 78 y.o.   MRN: [id number]   Subjective:    Patient ID: CACE OSORTO, male    DOB: October 01, 1940, 78 y.o.   MRN: [id number]  HPI  Patient here for his physical exam.  Saw cardiology 10/12/18.  Felt stable.  Recommended f/u in 4 months.  Was evaluated for COPD exacerbation 10/06/18.  Better.  Still some nasal congestion.  Some yellow congestion in am.  Clears in am and then ok throughout the day.  Breathing overall stable.  Saw Dr Grayland Ormond 09/25/18.  No changes made.  Following.  Does report some leg discomfort.  Notices increased leg pain in thighs and knees and calves with walking fast.  S/p injection.  Had therapy.  Discussed continuing exercise.  Desires no further intervention at this time.  Blood pressure doing well.  Right fourth finger lesion.  Discussed not picking.  bactroban as directed.  Notify me if does not resolve.     Past Medical History:  Diagnosis Date  . Adrenal gland anomaly    enlargement  . Anxiety   . CAD (coronary artery disease)   . Carpal tunnel syndrome   . Chronic hyponatremia   . Colitis   . Colonic polyp   . COPD (chronic obstructive pulmonary disease) ([place])   . Degenerative disc disease, lumbar   . Depression   . Diverticulosis   . Dyspnea   . GERD (gastroesophageal reflux disease)   . H/O degenerative disc disease   . Hypercholesterolemia   . Hyperkalemia   . Hyperlipidemia   . Hypertension   . Irritable bowel syndrome   . Monoclonal gammopathy   . Monoclonal gammopathy   . Neuropathy   . Personal history of tobacco use, presenting hazards to health 08/17/2015  . Sleep apnea   . Vertebral compression fracture [facility])    Past Surgical History:  Procedure Laterality Date  . BUNIONECTOMY  1989  . CARPAL TUNNEL RELEASE Left 2011   ulnar nerve sub muscular at elbow  . CHOLECYSTECTOMY  09/07  . COLONOSCOPY WITH PROPOFOL N/A 03/05/2017   Procedure: COLONOSCOPY WITH PROPOFOL;  Surgeon: Lucilla Lame, MD;   Location: [facility] ENDOSCOPY;  Service: Endoscopy;  Laterality: N/A;  . ELECTROPHYSIOLOGIC STUDY N/A 11/15/2015   Procedure: CARDIOVERSION;  Surgeon: Isaias Cowman, MD;  Location: [facility] ORS;  Service: Cardiovascular;  Laterality: N/A;  . ESOPHAGOGASTRODUODENOSCOPY (EGD) WITH PROPOFOL N/A 03/05/2017   Procedure: ESOPHAGOGASTRODUODENOSCOPY (EGD) WITH PROPOFOL;  Surgeon: Lucilla Lame, MD;  Location: [facility] ENDOSCOPY;  Service: Endoscopy;  Laterality: N/A;  . ESOPHAGOGASTRODUODENOSCOPY (EGD) WITH PROPOFOL N/A 10/17/2017   Procedure: ESOPHAGOGASTRODUODENOSCOPY (EGD) WITH PROPOFOL;  Surgeon: Lollie Sails, MD;  Location: [facility] ENDOSCOPY;  Service: Endoscopy;  Laterality: N/A;  . EYE SURGERY Bilateral 2010   cataract  . HEMORRHOID SURGERY    . KYPHOPLASTY N/A 11/04/2016   Procedure: KYPHOPLASTY T 12;  Surgeon: Hessie Knows, MD;  Location: [facility] ORS;  Service: Orthopedics;  Laterality: N/A;   Family History  Problem Relation Age of Onset  . Stroke Mother   . Heart disease Father        MI - 77   . Colon cancer Neg Hx   . Prostate cancer Neg Hx    Social History   Socioeconomic History  . Marital status: Married    Spouse name: Not on file  . Number of children: 3  . Years of education: Not on file  . Highest education level: Not on file  Occupational History  . Not on file  Social Needs  . Financial resource strain: Not on file  . Food insecurity:    Worry: Not on file    Inability: Not on file  . Transportation needs:    Medical: Not on file    Non-medical: Not on file  Tobacco Use  . Smoking status: Former Smoker    Types: Cigarettes    Last attempt to quit: 11/01/2015    Years since quitting: 3.0  . Smokeless tobacco: Never Used  . Tobacco comment: about 2 cigarettes per day, trying to quit  Substance and Sexual Activity  . Alcohol use: Yes    Alcohol/week: 42.0 standard drinks    Types: 42 Cans of beer per week    Comment: occas  . Drug use: No  . Sexual activity: Not on  file  Lifestyle  . Physical activity:    Days per week: Not on file    Minutes per session: Not on file  . Stress: Not on file  Relationships  . Social connections:    Talks on phone: Not on file    Gets together: Not on file    Attends religious service: Not on file    Active member of club or organization: Not on file    Attends meetings of clubs or organizations: Not on file    Relationship status: Not on file  Other Topics Concern  . Not on file  Social History Narrative  . Not on file    Outpatient Encounter Medications as of 10/29/2018  Medication Sig  . acetaminophen (TYLENOL) 500 MG tablet Take 500-1,000 mg by mouth 3 (three) times daily as needed for moderate pain or headache.  . albuterol (PROVENTIL HFA;VENTOLIN HFA) 108 (90 Base) MCG/ACT inhaler Inhale 2 puffs into the lungs every 6 (six) hours as needed for wheezing or shortness of breath.  Marland Kitchen alendronate (FOSAMAX) 70 MG tablet Take 70 mg by mouth once a week. Take with a full glass of water on an empty stomach. Takes on Sundays  . amLODipine (NORVASC) 10 MG tablet Take 1 tablet (10 mg total) by mouth daily.  Marland Kitchen azelastine (ASTELIN) 0.1 % nasal spray Place 1 spray into both nostrils 2 (two) times daily. Use in each nostril as directed  . budesonide (ENTOCORT EC) 3 MG 24 hr capsule Take 9 mg by mouth daily as needed (colitis flare).   . calcium-vitamin D (OSCAL WITH D) 500-200 MG-UNIT tablet Take 2 tablets by mouth daily.  Marland Kitchen docusate sodium (COLACE) 100 MG capsule   . ELIQUIS 5 MG TABS tablet Take 5 mg by mouth 2 (two) times daily.   Marland Kitchen FIBER PO Take 2 capsules by mouth daily.  . fluticasone (FLONASE) 50 MCG/ACT nasal spray Place 2 sprays into both nostrils daily.  . fluticasone furoate-vilanterol (BREO ELLIPTA) 100-25 MCG/INH AEPB Inhale 1 puff into the lungs daily.  . furosemide (LASIX) 20 MG tablet Take 1 tablet (20 mg total) by mouth daily as needed. (Patient taking differently: Take 40 mg by mouth daily. )  .  gabapentin (NEURONTIN) 100 MG capsule Take 4 capsules (400 mg) daily in the morning and midday. Take 8 capsules (800 mg) daily in the evening.  . isosorbide mononitrate (IMDUR) 30 MG 24 hr tablet Take 1 tablet (30 mg total) by mouth once daily.  . metoprolol tartrate (LOPRESSOR) 100 MG tablet Take 1 tablet (100 mg total) by mouth 2 (two) times daily. (Patient taking differently: Take 50 mg by mouth 2 (  two) times daily. )  . mupirocin ointment (BACTROBAN) 2 % Apply to affected area bid.  . pantoprazole (PROTONIX) 40 MG tablet Take 1 tablet (40 mg total) by mouth 2 (two) times daily.  . promethazine (PHENERGAN) 12.5 MG tablet Take 12.5 mg by mouth every 6 (six) hours as needed for nausea or vomiting.  . sertraline (ZOLOFT) 50 MG tablet Take 1 tablet (50 mg total) by mouth daily.  . vitamin B-12 (CYANOCOBALAMIN) 1000 MCG tablet Take 1,000 mcg by mouth daily.   . [DISCONTINUED] chlorpheniramine-HYDROcodone (TUSSIONEX PENNKINETIC ER) 10-8 MG/5ML SUER Take 5 mLs by mouth at bedtime as needed for cough.  . [DISCONTINUED] doxycycline (VIBRA-TABS) 100 MG tablet Take 1 tablet (100 mg total) by mouth 2 (two) times daily. With food  . [DISCONTINUED] predniSONE (DELTASONE) 20 MG tablet Take 2 tablets (40 mg total) by mouth daily with breakfast.   No facility-administered encounter medications on file as of 10/29/2018.     Review of Systems  Constitutional: Negative for appetite change and unexpected weight change.  HENT: Positive for congestion. Negative for sinus pressure and sore throat.   Eyes: Negative for pain and visual disturbance.  Respiratory: Negative for chest tightness and shortness of breath.        Cough and congestion - better.    Cardiovascular: Negative for chest pain and palpitations.  Gastrointestinal: Negative for abdominal pain, constipation and diarrhea.  Genitourinary: Negative for difficulty urinating and frequency.  Musculoskeletal: Negative for back pain and joint swelling.   Skin: Negative for rash and wound.  Neurological: Negative for dizziness and headaches.  Hematological: Negative for adenopathy. Does not bruise/bleed easily.  Psychiatric/Behavioral: Negative for decreased concentration and dysphoric mood.       Objective:     Blood pressure rechecked by me:  122/68  Physical Exam Constitutional:      General: He is not in acute distress.    Appearance: Normal appearance. He is well-developed.  HENT:     Head: Normocephalic and atraumatic.     Nose: Nose normal. No congestion.     Mouth/Throat:     Pharynx: No oropharyngeal exudate.  Eyes:     General:        Right eye: No discharge.        Left eye: No discharge.     Conjunctiva/sclera: Conjunctivae normal.  Neck:     Musculoskeletal: Neck supple. No muscular tenderness.     Thyroid: No thyromegaly.  Cardiovascular:     Rate and Rhythm: Normal rate and regular rhythm.  Pulmonary:     Effort: No respiratory distress.     Breath sounds: Normal breath sounds. No wheezing.  Abdominal:     General: Bowel sounds are normal.     Palpations: Abdomen is soft.     Tenderness: There is no abdominal tenderness.  Genitourinary:    Penis: Normal.      Rectum: Normal.  Musculoskeletal:        General: No swelling or tenderness.  Lymphadenopathy:     Cervical: No cervical adenopathy.  Skin:    Findings: No erythema or rash.  Neurological:     Mental Status: He is alert and oriented to person, place, and time.  Psychiatric:        Mood and Affect: Mood normal.        Behavior: Behavior normal.     BP 118/68   Pulse 64   Temp (!) 97.4 F (36.3 C) (Oral)   Resp 16   Ht  5\' 11"  (1.803 m)   Wt 189 lb 12.8 oz (86.1 kg)   SpO2 95%   BMI 26.47 kg/m  Wt Readings from Last 3 Encounters:  10/29/18 189 lb 12.8 oz (86.1 kg)  10/06/18 190 lb 3.2 oz (86.3 kg)  09/25/18 191 lb (86.6 kg)     Lab Results  Component Value Date   WBC 7.5 09/21/2018   HGB 10.2 (L) 09/21/2018   HCT 30.1 (L)  09/21/2018   PLT 380 09/21/2018   GLUCOSE 99 09/21/2018   CHOL 148 12/17/2017   TRIG 66.0 12/17/2017   HDL 73.90 12/17/2017   LDLCALC 61 12/17/2017   ALT 11 02/18/2018   AST 21 02/18/2018   NA 128 (L) 09/21/2018   K 4.0 09/21/2018   CL 94 (L) 09/21/2018   CREATININE 0.98 09/21/2018   BUN 13 09/21/2018   CO2 22 09/21/2018   TSH 1.75 08/18/2017   PSA 1.79 12/17/2017   INR 1.11 01/08/2017   HGBA1C 5.9 12/17/2017   MICROALBUR 1.9 10/27/2017    Dg Chest 2 View  Result Date: 06/09/2018 CLINICAL DATA:  Shortness of breath. EXAM: CHEST - 2 VIEW COMPARISON:  Chest x-ray dated April 16, 2018. FINDINGS: The heart is at the upper limits of normal in size. Normal mediastinal contours. Atherosclerotic calcification of the aortic arch. The lungs remain hyperinflated with emphysematous changes and chronically coarsened interstitial markings in both upper lobes. There is superimposed mild diffuse interstitial thickening and a trace right pleural effusion. No pneumothorax. No acute osseous abnormality. IMPRESSION: 1. Mild diffusely increased interstitial markings favored to reflect interstitial edema in the setting of new trace right pleural effusion. 2. COPD and chronic interstitial lung disease. Electronically Signed   By: Titus Dubin M.D.   On: 06/09/2018 18:47       Assessment & Plan:   Problem List Items Addressed This Visit    Atrial flutter [facility] 2)    S/p cardioversion.  On eliquis.  Followed by cardiology.  Stable.        CAD (coronary artery disease)    Followed by cardiology.  Appears to be stable.  Continue risk factor modification.        Chronic back pain    Has seen Dr Sharlet Salina.  Now being followed at pain clinic.  S/p injection.        Collagenous colitis    Stable.  Followed by GI.        GERD (gastroesophageal reflux disease)    Controlled.  Follow.        Health care maintenance    Physical today 10/29/18.  Colonoscopy 08/25/14.  PSA 12/2017 - 1.79.         Hyperlipemia    Low cholesterol diet and exercise.  Follow lipid panel.       Relevant Orders   Hepatic function panel   Lipid panel   Hypertension    Blood pressure under good control.  Continue same medication regimen.  Follow pressures.  Follow metabolic panel.        Relevant Orders   TSH   Basic metabolic panel   Hyponatremia    Has been worked up extensively.  Has been evaluated by nephrology.  Has been stable.  Follow.        Iron deficiency anemia    Seeing hematology.  IV iron.  Continue f/u with hematology.        Leg pain    Leg pain as outlined.  S/p injection.  Followed at pain  clinic.  Desires no further intervention.  Follow.        MGUS (monoclonal gammopathy of unknown significance)    Followed by hematology.       Moderate COPD (chronic obstructive pulmonary disease) ([facility])    Recently treated for COPD exacerbation.  Some residual congestion.  Steroid nasal spray and saline nasal spray as directed.  Follow.        Pulmonary hypertension ([place])    Followed by pulmonary and cardiology.       Sleep apnea    CPAP      Thoracic aortic aneurysm ([place])    Has a 4cm ascending thoracic aneurysm.  Recommended CT chest f/u in one year.  Last evaluated 04/2018.        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         Other Visit Diagnoses    Hyperglycemia    -  Primary   Relevant Orders   Hemoglobin A1c       Einar Pheasant, MD

## 2018-10-29 NOTE — Assessment & Plan Note (Addendum)
Physical today 10/29/18.  Colonoscopy 08/25/14.  PSA 12/2017 - 1.79.

## 2018-10-31 ENCOUNTER — Encounter: Payer: Self-pay | Admitting: Internal Medicine

## 2018-10-31 NOTE — Assessment & Plan Note (Signed)
S/p cardioversion.  On eliquis.  Followed by cardiology.  Stable.   

## 2018-10-31 NOTE — Assessment & Plan Note (Signed)
CPAP.  

## 2018-10-31 NOTE — Assessment & Plan Note (Signed)
Followed by pulmonary and cardiology.  

## 2018-10-31 NOTE — Assessment & Plan Note (Signed)
Leg pain as outlined.  S/p injection.  Followed at pain clinic.  Desires no further intervention.  Follow.

## 2018-10-31 NOTE — Assessment & Plan Note (Signed)
Blood pressure under good control.  Continue same medication regimen.  Follow pressures.  Follow metabolic panel.   

## 2018-10-31 NOTE — Assessment & Plan Note (Signed)
Has been worked up extensively.  Has been evaluated by nephrology.  Has been stable.  Follow.

## 2018-10-31 NOTE — Assessment & Plan Note (Signed)
Low cholesterol diet and exercise.  Follow lipid panel.   

## 2018-10-31 NOTE — Assessment & Plan Note (Signed)
Followed by hematology 

## 2018-10-31 NOTE — Assessment & Plan Note (Signed)
Recently treated for COPD exacerbation.  Some residual congestion.  Steroid nasal spray and saline nasal spray as directed.  Follow.

## 2018-10-31 NOTE — Assessment & Plan Note (Signed)
Followed by cardiology.  Appears to be stable.  Continue risk factor modification.

## 2018-10-31 NOTE — Assessment & Plan Note (Signed)
Controlled.  Follow.   

## 2018-10-31 NOTE — Assessment & Plan Note (Signed)
Stable. Followed by GI. 

## 2018-10-31 NOTE — Assessment & Plan Note (Signed)
Seeing hematology.  IV iron.  Continue f/u with hematology.

## 2018-10-31 NOTE — Assessment & Plan Note (Signed)
Has a 4 cm ascending thoracic aneurysm.  Recommended CT chest f/u in one year.  Last evaluated 04/2018.   

## 2018-10-31 NOTE — Assessment & Plan Note (Signed)
Has seen Dr Sharlet Salina.  Now being followed at pain clinic.  S/p injection.

## 2018-11-02 ENCOUNTER — Telehealth: Payer: Self-pay | Admitting: Internal Medicine

## 2018-11-02 NOTE — Telephone Encounter (Signed)
Returned pt's call to triage.  Pt stated that he fell out in the woods on Friday and "tore up both arms".  Pt said that his skin was peeled back.  Pt denied hitting his head but did say that he has a bruise on his leg and thigh.  Pt also said that he believes that he may have cracked or bruised a rib on his right side.  Pt said that it hurts to get in and out of the bed but is walking around fine.  Pt said that a nurse friend of the family recommended that he get a  Tetanus shot. No record of tetanus vaccine in pt's chart.  Advised pt to go to [facility] walk-in clinic for evaluation of rib injury.  Gave pt  location address and clinic hours.  Pt says that he will wait to go to [facility].  Informed pt that notes will be forwarded to PCP's nurse.  Please call pt back and advise.

## 2018-11-02 NOTE — Telephone Encounter (Signed)
Copied from [place] [id number]. Topic: Quick Communication - See Telephone Encounter >> Nov 02, 2018  2:04 PM Rayann Heman wrote: CRM for notification. See Telephone encounter for: 11/02/18. Pt called and stated that he fell in the dirt and was advised to have a tetanus shot. Pt would like a call back for advise. Please advise

## 2018-11-02 NOTE — Telephone Encounter (Signed)
Spoke with patient and confirmed nothing more acute. Confirmed he did not hit his head and did not pass out. Patient stated he felt like his ribs were getting better as well as his arms. Offered for patient to come in for appt if he would like to be evaluated. Patient stated he would like to wait for now because he does feel like he is getting better. Advised that he should go to the pharmacy for a tetanus shot per Dr. Nicki Reaper. Patient stated he will do that and let me know if he needs anything else.

## 2018-11-16 ENCOUNTER — Other Ambulatory Visit: Payer: Self-pay

## 2018-11-16 ENCOUNTER — Other Ambulatory Visit (INDEPENDENT_AMBULATORY_CARE_PROVIDER_SITE_OTHER): Payer: PPO

## 2018-11-16 DIAGNOSIS — R739 Hyperglycemia, unspecified: Secondary | ICD-10-CM | POA: Diagnosis not present

## 2018-11-16 DIAGNOSIS — E785 Hyperlipidemia, unspecified: Secondary | ICD-10-CM | POA: Diagnosis not present

## 2018-11-16 DIAGNOSIS — I10 Essential (primary) hypertension: Secondary | ICD-10-CM | POA: Diagnosis not present

## 2018-11-16 LAB — HEPATIC FUNCTION PANEL
ALK PHOS: 71 U/L (ref 39–117)
ALT: 11 U/L (ref 0–53)
AST: 16 U/L (ref 0–37)
Albumin: 3.6 g/dL (ref 3.5–5.2)
Bilirubin, Direct: 0.3 mg/dL (ref 0.0–0.3)
Total Bilirubin: 0.3 mg/dL (ref 0.2–1.2)
Total Protein: 8.2 g/dL (ref 6.0–8.3)

## 2018-11-16 LAB — LIPID PANEL
Cholesterol: 136 mg/dL (ref 0–200)
HDL: 55.5 mg/dL (ref >39.00)
LDL Cholesterol: 71 mg/dL (ref 0–99)
NonHDL: 80.81
Total CHOL/HDL Ratio: 2
Triglycerides: 48 mg/dL (ref 0.0–149.0)
VLDL: 9.6 mg/dL (ref 0.0–40.0)

## 2018-11-16 LAB — BASIC METABOLIC PANEL
BUN: 12 mg/dL (ref 6–23)
CO2: 24 mEq/L (ref 19–32)
Calcium: 9.9 mg/dL (ref 8.4–10.5)
Chloride: 93 mEq/L — ABNORMAL LOW (ref 96–112)
Creatinine, Ser: 0.98 mg/dL (ref 0.40–1.50)
GFR: 74.23 mL/min (ref >60.00)
Glucose, Bld: 84 mg/dL (ref 70–99)
Potassium: 5.1 mEq/L (ref 3.5–5.1)
Sodium: 125 mEq/L — ABNORMAL LOW (ref 135–145)

## 2018-11-16 LAB — HEMOGLOBIN A1C: Hgb A1c MFr Bld: 5.1 % (ref 4.6–6.5)

## 2018-11-16 LAB — TSH: TSH: 1.68 u[IU]/mL (ref 0.35–4.50)

## 2018-11-18 ENCOUNTER — Other Ambulatory Visit: Payer: Self-pay

## 2018-11-18 ENCOUNTER — Other Ambulatory Visit (INDEPENDENT_AMBULATORY_CARE_PROVIDER_SITE_OTHER): Payer: PPO

## 2018-11-18 ENCOUNTER — Telehealth: Payer: Self-pay

## 2018-11-18 DIAGNOSIS — E871 Hypo-osmolality and hyponatremia: Secondary | ICD-10-CM

## 2018-11-18 LAB — SODIUM: Sodium: 128 mEq/L — ABNORMAL LOW (ref 135–145)

## 2018-11-18 NOTE — Telephone Encounter (Signed)
Orders entered for sodium recheck

## 2018-12-03 ENCOUNTER — Other Ambulatory Visit: Payer: Self-pay | Admitting: Internal Medicine

## 2018-12-15 ENCOUNTER — Other Ambulatory Visit: Payer: Self-pay | Admitting: Pulmonary Disease

## 2018-12-15 ENCOUNTER — Inpatient Hospital Stay: Payer: PPO | Attending: Hematology and Oncology

## 2018-12-15 ENCOUNTER — Other Ambulatory Visit: Payer: Self-pay

## 2018-12-15 DIAGNOSIS — C880 Waldenstrom macroglobulinemia: Secondary | ICD-10-CM

## 2018-12-15 DIAGNOSIS — D509 Iron deficiency anemia, unspecified: Secondary | ICD-10-CM | POA: Insufficient documentation

## 2018-12-15 LAB — CBC WITH DIFFERENTIAL/PLATELET
Abs Immature Granulocytes: 0.03 10*3/uL (ref 0.00–0.07)
Basophils Absolute: 0 10*3/uL (ref 0.0–0.1)
Basophils Relative: 1 %
Eosinophils Absolute: 0 10*3/uL (ref 0.0–0.5)
Eosinophils Relative: 0 %
HCT: 33.6 % — ABNORMAL LOW (ref 39.0–52.0)
Hemoglobin: 11.2 g/dL — ABNORMAL LOW (ref 13.0–17.0)
Immature Granulocytes: 0 %
Lymphocytes Relative: 20 %
Lymphs Abs: 1.4 10*3/uL (ref 0.7–4.0)
MCH: 35 pg — ABNORMAL HIGH (ref 26.0–34.0)
MCHC: 33.3 g/dL (ref 30.0–36.0)
MCV: 105 fL — ABNORMAL HIGH (ref 80.0–100.0)
Monocytes Absolute: 0.7 10*3/uL (ref 0.1–1.0)
Monocytes Relative: 11 %
Neutro Abs: 4.6 10*3/uL (ref 1.7–7.7)
Neutrophils Relative %: 68 %
Platelets: 372 10*3/uL (ref 150–400)
RBC: 3.2 MIL/uL — ABNORMAL LOW (ref 4.22–5.81)
RDW: 13.7 % (ref 11.5–15.5)
WBC: 6.8 10*3/uL (ref 4.0–10.5)
nRBC: 0 % (ref 0.0–0.2)

## 2018-12-15 LAB — BASIC METABOLIC PANEL
Anion gap: 8 (ref 5–15)
BUN: 14 mg/dL (ref 8–23)
CO2: 23 mmol/L (ref 22–32)
Calcium: 9.2 mg/dL (ref 8.9–10.3)
Chloride: 96 mmol/L — ABNORMAL LOW (ref 98–111)
Creatinine, Ser: 1.1 mg/dL (ref 0.61–1.24)
GFR calc Af Amer: >60 mL/min (ref >60)
GFR calc non Af Amer: >60 mL/min (ref >60)
Glucose, Bld: 107 mg/dL — ABNORMAL HIGH (ref 70–99)
Potassium: 4.5 mmol/L (ref 3.5–5.1)
Sodium: 127 mmol/L — ABNORMAL LOW (ref 135–145)

## 2018-12-15 LAB — IRON AND TIBC
Iron: 58 ug/dL (ref 45–182)
Saturation Ratios: 5 % — ABNORMAL LOW (ref 17.9–39.5)
TIBC: 1112 ug/dL — ABNORMAL HIGH (ref 250–450)
UIBC: 1054 ug/dL

## 2018-12-15 LAB — FERRITIN: Ferritin: 19 ng/mL — ABNORMAL LOW (ref 24–336)

## 2018-12-16 LAB — KAPPA/LAMBDA LIGHT CHAINS
Kappa free light chain: 78.6 mg/L — ABNORMAL HIGH (ref 3.3–19.4)
Kappa, lambda light chain ratio: 5.78 — ABNORMAL HIGH (ref 0.26–1.65)
Lambda free light chains: 13.6 mg/L (ref 5.7–26.3)

## 2018-12-16 LAB — IGG, IGA, IGM
IgA: 38 mg/dL — ABNORMAL LOW (ref 61–437)
IgG (Immunoglobin G), Serum: 494 mg/dL — ABNORMAL LOW (ref 603–1613)
IgM (Immunoglobulin M), Srm: 3545 mg/dL — ABNORMAL HIGH (ref 15–143)

## 2018-12-17 LAB — PROTEIN ELECTROPHORESIS, SERUM
A/G Ratio: 0.9 (ref 0.7–1.7)
Albumin ELP: 3.9 g/dL (ref 2.9–4.4)
Alpha-1-Globulin: 0.2 g/dL (ref 0.0–0.4)
Alpha-2-Globulin: 0.6 g/dL (ref 0.4–1.0)
Beta Globulin: 1.3 g/dL (ref 0.7–1.3)
Gamma Globulin: 2.4 g/dL — ABNORMAL HIGH (ref 0.4–1.8)
Globulin, Total: 4.5 g/dL — ABNORMAL HIGH (ref 2.2–3.9)
M-Spike, %: 2.1 g/dL — ABNORMAL HIGH
Total Protein ELP: 8.4 g/dL (ref 6.0–8.5)

## 2018-12-21 ENCOUNTER — Other Ambulatory Visit: Payer: Self-pay

## 2018-12-21 NOTE — Progress Notes (Signed)
[facility]  [address] [place], [place] [address] Phone: [phone]  Fax: [phone]   Clinic Day:  12/22/2018  Referring physician: Einar Pheasant, MD  Chief Complaint: Troy Lopez is a 78 y.o. 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  He was initially followed by Dr. Inez Pilgrim.  He has been followed without intervention.  IgM has been followed: 4932 on 06/12/2017, 3635 on 09/18/2017, 3368 on 12/23/2017, 1745 on 05/27/2018, 3380 on 09/21/2018 and 3545 on 12/15/2018.  SPEP has been followed (gm/dL): 1.1 on 03/13/2017, 2.6 on 06/12/2017, 2.3 on 09/18/2017, 1.8 on 12/23/2017, 0.9 on 05/27/2018, 2.0 on 09/21/2018 and 2.1 on 12/15/2018.  Kappa free light chains have been followed (mg/L): 81.4 (ratio 6.17) on 03/13/2017, 75.9 (ratio 5.71) on 12/23/2017, and 78.6 (ratio 5.78) on 12/15/2018.  The patient was initially by Dr. Delight Hoh on 07/22/2015.  At that time his M spike trended upward slightly from 1.7 to 2.3. He opted for follow-up every 6-12 months.   Bone marrow biopsy on 01/08/2017 revealed approximately 20% of a low grade B-cell lymphoproliferative process associated with plasma cell differentiation. The differential diagnosis included lymphoplasmacytic lymphoma (Waldenstrom's macroglobulinemia), marginal zone lymphoma, and splenic lymphoma. Cytogenetics were normal (46, XY).   He has iron deficiency anemia.  He is followed by Dr. Gustavo Lah at the [facility].  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  He is treated with Entocort prn.    He states that it has been 18 months since his last flare.  EGD by Dr Allen Norris on 03/05/2017 revealed a small hiatal hernia, mild Schatzki ring, gastritis, and a normal examined duodenum.  Colonoscopy on  03/05/2017 revealed one 3 mm polyp in the rectum, removed with a cold snare, diverticulosis in the sigmoid colon, and internal hemorrhoids.  Small bowel series on 09/29/2017 revealed a duodenal diverticulum.  There was no abnormality to account for his anemia.  Video capsule endoscopy on 10/14/2017 revealed multiple flashes of older and fresher red blood in the gastric vault. 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  There was concern for significant pyloric channel or duodenal bulb ulcer.   EGD on 10/17/2017 by Dr Gustavo Lah revealed a small hiatal hernia, a non-obstructing Schaztki ring, and two 2-3 mm non-bleeding angiodysplastic lesions in the gastric body.  They were treated with argon plasma coagulation (APC). Clip (MR conditional) was placed. Hemostasis was very good at both treatment sites.  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  There were two non-bleeding angioectasias in the duodenum.  He has received Ferahame on 06/05/2016, 06/12/2016, 03/18/2017, 03/25/2017, 09/25/2017, 09/23/2017, 12/26/2017, 01/02/2018, 06/03/2018, 06/10/2018, 06/26/2018, 09/25/2018, and 10/02/2018.  Ferritin has been followed: 10.6 on 09/18/2012, 13.9 on 08/03/2013, 12.7 on 02/10/2014, 15.7 on 06/15/2014, 13.9 on 04/19/2016, 21 on 09/18/2016, 28 on 11/22/2016, 17 on 03/13/2017, 22 on 06/12/2017, 18 on 09/18/2017, 15.3 on 12/17/2017, 18 on 05/0/02/2018, 19 on 03/25/2018, 14 on 05/27/2018, 219 on 06/09/2018, 106 on 06/24/2018, 29 on 09/21/2018 and 19 on 12/15/2018  The patient was last seen in the hematology clinic on 09/25/2018 by Dr. Delight Hoh.  At that time, he continued to have chronic weakness, fatigue, and back pain. He denied any other complaints. He received IV Feraheme and a second dose two weeks later.  His [name]'s macroglobulinemia did  not require treatment at that time.   CBC on 12/15/2018 showed: hemoglobin 11.2, hematocrit 33.6, MCV 105, sodium 127,  ferritin 19, iron saturation 5%. M spike was 2.1gm/dL. Kappa light chains 78.6. Lambda light chains 13.6. Ratio 5.78.  IgG 494. IgA 38. IgM 3,545.  During the interim, he is doing "okay."  He is severely fatigued for the past 3 weeks. He has a chronic productive cough. He has chronic headaches.  He reports environmental allergies, and persistent runny nose. 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   He sees pulmonologist Dr. Rosita Fire every 6 months. He saw Dr. Holley Raring for hyponatremia and was put on a fluid restriction.   He is very concerned about the cost of treatment and tests.  He is hesitant to start treatment for Waldenstrom's due to the cost.    Past Medical History:  Diagnosis Date   Adrenal gland anomaly    enlargement   Anxiety    CAD (coronary artery disease)    Carpal tunnel syndrome    Chronic hyponatremia    Colitis    Colonic polyp    COPD (chronic obstructive pulmonary disease) (HCC)    Degenerative disc disease, lumbar    Depression    Diverticulosis    Dyspnea    GERD (gastroesophageal reflux disease)    H/O degenerative disc disease    Hypercholesterolemia    Hyperkalemia    Hyperlipidemia    Hypertension    Irritable bowel syndrome    Monoclonal gammopathy    Monoclonal gammopathy    Neuropathy    Personal history of tobacco use, presenting hazards to health 08/17/2015   Sleep apnea    Vertebral compression fracture [facility])     Past Surgical History:  Procedure Laterality Date   BUNIONECTOMY  1989   CARPAL TUNNEL RELEASE Left 2011   ulnar nerve sub muscular at elbow   CHOLECYSTECTOMY  09/07   COLONOSCOPY WITH PROPOFOL N/A 03/05/2017   Procedure: COLONOSCOPY WITH PROPOFOL;  Surgeon: Lucilla Lame, MD;  Location: [facility] ENDOSCOPY;  Service: Endoscopy;  Laterality: N/A;   ELECTROPHYSIOLOGIC STUDY N/A 11/15/2015   Procedure: CARDIOVERSION;  Surgeon: Isaias Cowman,  MD;  Location: [facility] ORS;  Service: Cardiovascular;  Laterality: N/A;   ESOPHAGOGASTRODUODENOSCOPY (EGD) WITH PROPOFOL N/A 03/05/2017   Procedure: ESOPHAGOGASTRODUODENOSCOPY (EGD) WITH PROPOFOL;  Surgeon: Lucilla Lame, MD;  Location: [facility] ENDOSCOPY;  Service: Endoscopy;  Laterality: N/A;   ESOPHAGOGASTRODUODENOSCOPY (EGD) WITH PROPOFOL N/A 10/17/2017   Procedure: ESOPHAGOGASTRODUODENOSCOPY (EGD) WITH PROPOFOL;  Surgeon: Lollie Sails, MD;  Location: [facility] ENDOSCOPY;  Service: Endoscopy;  Laterality: N/A;   EYE SURGERY Bilateral 2010   cataract   HEMORRHOID SURGERY     KYPHOPLASTY N/A 11/04/2016   Procedure: KYPHOPLASTY T 12;  Surgeon: Hessie Knows, MD;  Location: [facility] ORS;  Service: Orthopedics;  Laterality: N/A;    Family History  Problem Relation Age of Onset   Stroke Mother    Heart disease Father        MI - 70    Colon cancer Neg Hx    Prostate cancer Neg Hx     Social History:  reports that he quit smoking about 3 years ago. His smoking use included cigarettes. He has never used smokeless tobacco. He reports current alcohol use of about 42.0 standard drinks of alcohol per week. He reports that he does not use drugs.   At one time, he was smoking 2 packs/day.  He smoked for  60 years.  He drinks 6 beers per day and has for several years. He smoked 2 packs per day for 60 years and has quit and restarted several times.  He is retired but previously worked in Therapist, art for a Clinical cytogeneticist. He denies any known exposure to radiation or toxins. The patient is alone today.  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   Not to be given due to Waldenstrom's syndrome, told it could affect kidney function    Current Medications: Current Outpatient Medications  Medication Sig Dispense Refill   acetaminophen (TYLENOL) 500 MG tablet Take 500-1,000 mg by  mouth 3 (three) times daily as needed for moderate pain or headache.     albuterol (PROVENTIL HFA;VENTOLIN HFA) 108 (90 Base) MCG/ACT inhaler Inhale 2 puffs into the lungs every 6 (six) hours as needed for wheezing or shortness of breath. 1 Inhaler 10   alendronate (FOSAMAX) 70 MG tablet Take 70 mg by mouth once a week. Take with a full glass of water on an empty stomach. Takes on Sundays     amLODipine (NORVASC) 10 MG tablet Take 1 tablet (10 mg total) by mouth daily. 30 tablet 3   azelastine (ASTELIN) 0.1 % nasal spray Place 1 spray into both nostrils 2 (two) times daily. Use in each nostril as directed 30 mL 1   BREO ELLIPTA 100-25 MCG/INH AEPB INHALE 1 PUFF INTO LUNGS DAILY. 60 each 0   calcium-vitamin D (OSCAL WITH D) 500-200 MG-UNIT tablet Take 2 tablets by mouth daily.     ELIQUIS 5 MG TABS tablet Take 5 mg by mouth 2 (two) times daily.      FIBER PO Take 1 capsule by mouth daily.      fluticasone (FLONASE) 50 MCG/ACT nasal spray Place 2 sprays into both nostrils daily. 16 g 0   furosemide (LASIX) 20 MG tablet Take 1 tablet (20 mg total) by mouth daily as needed. (Patient taking differently: Take 40 mg by mouth daily. ) 30 tablet 0   gabapentin (NEURONTIN) 100 MG capsule Take 4 capsules (400 mg) daily in the morning and midday. Take 8 capsules (800 mg) daily in the evening. 480 capsule 1   isosorbide mononitrate (IMDUR) 30 MG 24 hr tablet Take 1 tablet (30 mg total) by mouth once daily.     metoprolol tartrate (LOPRESSOR) 100 MG tablet Take 1 tablet (100 mg total) by mouth 2 (two) times daily. (Patient taking differently: Take 50 mg by mouth 2 (two) times daily. ) 60 tablet 2   mupirocin ointment (BACTROBAN) 2 % Apply to affected area bid. 22 g 0   pantoprazole (PROTONIX) 40 MG tablet Take 1 tablet (40 mg total) by mouth 2 (two) times daily. 180 tablet 0   sertraline (ZOLOFT) 50 MG tablet Take 1 tablet (50 mg total) by mouth daily. 30 tablet 0   vitamin B-12 (CYANOCOBALAMIN)  1000 MCG tablet Take 1,000 mcg by mouth daily.      budesonide (ENTOCORT EC) 3 MG 24 hr capsule Take 9 mg by mouth daily as needed (colitis flare).      docusate sodium (COLACE) 100 MG capsule 100 mg daily as needed.      promethazine (PHENERGAN) 12.5 MG tablet Take 12.5 mg by mouth every 6 (six) hours as needed for nausea or vomiting.     No current facility-administered medications for this visit.     Review of  Systems  Constitutional: Positive for malaise/fatigue (severe for past few weeks). Negative for chills, diaphoresis, fever and weight loss.  HENT: Positive for hearing loss (hearing aids). Negative for congestion, ear discharge, ear pain, nosebleeds, sinus pain, sore throat and tinnitus.        Runny nose often  Eyes: Negative for blurred vision, double vision, photophobia and pain.  Respiratory: Positive for cough (COPD, productive) and sputum production ("clear yuck"). Negative for hemoptysis, shortness of breath and wheezing.   Cardiovascular: Negative.  Negative for chest pain, palpitations, orthopnea, leg swelling and PND.  Gastrointestinal: Negative.  Negative for abdominal pain, blood in stool, constipation, diarrhea, heartburn, melena, nausea and vomiting.       Daily BMs. Decreased appetite.   Genitourinary: Negative.  Negative for dysuria, frequency, hematuria and urgency.  Musculoskeletal: Positive for back pain (chronic- comes and goes; known stenosis). Negative for falls, joint pain, myalgias and neck pain.  Skin: Negative.  Negative for itching and rash.  Neurological: Positive for sensory change (neuropathy in feet, unknown cause), weakness and headaches (often, for several years). Negative for dizziness, tremors, speech change, focal weakness and seizures.  Endo/Heme/Allergies: Positive for environmental allergies. Does not bruise/bleed easily.  Psychiatric/Behavioral: Negative for depression and memory loss. The patient is not nervous/anxious and does not have  insomnia.    Performance status (ECOG): 1-2  Physical Exam  Constitutional: He is oriented to person, place, and time. He appears well-developed and well-nourished. No distress.  Doing "okay."  HENT:  Head: Normocephalic and atraumatic.  Mouth/Throat: No oropharyngeal exudate.  Gray hair and beard.  Eyes: Pupils are equal, round, and reactive to light. Conjunctivae and EOM are normal.  Glasses.  Blue/brown eyes.  Neck: Normal range of motion. Neck supple.  Cardiovascular: Normal rate, regular rhythm and normal heart sounds. Exam reveals no friction rub.  No murmur heard. Pulmonary/Chest: Effort normal and breath sounds normal. No respiratory distress. He has no wheezes. He has no rales.  Abdominal: Soft. Bowel sounds are normal. He exhibits no distension. There is no splenomegaly (spleen tip palpable). There is no abdominal tenderness. There is no rebound.  Musculoskeletal: Normal range of motion.        General: No edema.     Comments: Hands cool.  Lymphadenopathy:    He has no cervical adenopathy.    He has axillary adenopathy (1-1.5 cm left).       Right: No supraclavicular adenopathy present.       Left: No supraclavicular adenopathy present.  Neurological: He is alert and oriented to person, place, and time.  Skin: Skin is warm and dry. He is not diaphoretic.  Scattered spider angiomas.  Psychiatric: He has a normal mood and affect. His behavior is normal. Judgment and thought content normal.  Nursing note and vitals reviewed.   No visits with results within 3 Day(s) from this visit.  Latest known visit with results is:  Appointment on 12/15/2018  Component Date Value Ref Range Status   Total Protein ELP 12/15/2018 8.4  6.0 - 8.5 g/dL Final   Albumin ELP 12/15/2018 3.9  2.9 - 4.4 g/dL Final   Alpha-1-Globulin 12/15/2018 0.2  0.0 - 0.4 g/dL Final   Alpha-2-Globulin 12/15/2018 0.6  0.4 - 1.0 g/dL Final   Beta Globulin 12/15/2018 1.3  0.7 - 1.3 g/dL Final   Gamma  Globulin 12/15/2018 2.4* 0.4 - 1.8 g/dL Final   M-Spike, % 12/15/2018 2.1* Not Observed g/dL Final   SPE Interp. 12/15/2018 Comment   Final  Comment: (NOTE) The SPE pattern demonstrates a single peak (M-spike) in the gamma region which may represent monoclonal protein. This peak may also be caused by circulating immune complexes, cryoglobulins, C-reactive protein, fibrinogen or hemolysis.  If clinically indicated, the presence of a monoclonal gammopathy may be confirmed by immuno- fixation, as well as an evaluation of the urine for the presence of Bence-Jones protein. Performed At: [facility] [place], [place] [id number] Rush Farmer MD [id number]    Comment 12/15/2018 Comment   Final   Comment: (NOTE) Protein electrophoresis scan will follow via computer, mail, or courier delivery.    Globulin, Total 12/15/2018 4.5* 2.2 - 3.9 g/dL Corrected   A/G Ratio 12/15/2018 0.9  0.7 - 1.7 Corrected   Kappa free light chain 12/15/2018 78.6* 3.3 - 19.4 mg/L Final   Lamda free light chains 12/15/2018 13.6  5.7 - 26.3 mg/L Final   Kappa, lamda light chain ratio 12/15/2018 5.78* 0.26 - 1.65 Final   Comment: (NOTE) Performed At: [facility] [place], [place] [id number] Rush Farmer MD [id number]    IgG (Immunoglobin G), Serum 12/15/2018 494* 603 - 1,613 mg/dL Final   IgA 12/15/2018 38* 61 - 437 mg/dL Final   Result confirmed on concentration.   IgM (Immunoglobulin M), Srm 12/15/2018 3,545* 15 - 143 mg/dL Final   Comment: (NOTE) Results confirmed on dilution. Performed At: [facility] [place], [place] [id number] Rush Farmer MD [id number]    Iron 12/15/2018 58  45 - 182 ug/dL Final   TIBC 12/15/2018 1,112* 250 - 450 ug/dL Final   Saturation Ratios 12/15/2018 5* 17.9 - 39.5 % Final   UIBC 12/15/2018 1,054  ug/dL Final   Performed at [facility], [address] [place],  [place] [address]   Ferritin 12/15/2018 19* 24 - 336 ng/mL Final   Performed at [facility], [address] [place], [place] [address]   Sodium 12/15/2018 127* 135 - 145 mmol/L Final   Potassium 12/15/2018 4.5  3.5 - 5.1 mmol/L Final   Chloride 12/15/2018 96* 98 - 111 mmol/L Final   CO2 12/15/2018 23  22 - 32 mmol/L Final   Glucose, Bld 12/15/2018 107* 70 - 99 mg/dL Final   BUN 12/15/2018 14  8 - 23 mg/dL Final   Creatinine, Ser 12/15/2018 1.10  0.61 - 1.24 mg/dL Final   Calcium 12/15/2018 9.2  8.9 - 10.3 mg/dL Final   GFR calc non Af Amer 12/15/2018 >60  >60 mL/min Final   GFR calc Af Amer 12/15/2018 >60  >60 mL/min Final   Anion gap 12/15/2018 8  5 - 15 Final   Performed at [facility], [address], [place], [place] [address]   WBC 12/15/2018 6.8  4.0 - 10.5 K/uL Final   RBC 12/15/2018 3.20* 4.22 - 5.81 MIL/uL Final   Hemoglobin 12/15/2018 11.2* 13.0 - 17.0 g/dL Final   HCT 12/15/2018 33.6* 39.0 - 52.0 % Final   MCV 12/15/2018 105.0* 80.0 - 100.0 fL Final   MCH 12/15/2018 35.0* 26.0 - 34.0 pg Final   MCHC 12/15/2018 33.3  30.0 - 36.0 g/dL Final   RDW 12/15/2018 13.7  11.5 - 15.5 % Final   Platelets 12/15/2018 372  150 - 400 K/uL Final   nRBC 12/15/2018 0.0  0.0 - 0.2 % Final   Neutrophils Relative % 12/15/2018 68  % Final   Neutro Abs 12/15/2018 4.6  1.7 - 7.7 K/uL Final   Lymphocytes  Relative 12/15/2018 20  % Final   Lymphs Abs 12/15/2018 1.4  0.7 - 4.0 K/uL Final   Monocytes Relative 12/15/2018 11  % Final   Monocytes Absolute 12/15/2018 0.7  0.1 - 1.0 K/uL Final   Eosinophils Relative 12/15/2018 0  % Final   Eosinophils Absolute 12/15/2018 0.0  0.0 - 0.5 K/uL Final   Basophils Relative 12/15/2018 1  % Final   Basophils Absolute 12/15/2018 0.0  0.0 - 0.1 K/uL Final   Immature Granulocytes 12/15/2018 0  % Final   Abs Immature Granulocytes 12/15/2018 0.03  0.00 - 0.07 K/uL Final   Performed at [facility],  [address] [place], [place] [address]    Assessment:  Troy Lopez is a 78 y.o. male with [name]'s macroglobulinemia and iron deficiency anemia.  Bone marrow biopsy on 01/08/2017 revealed approximately 20% of a low grade B-cell lymphoproliferative process associated with plasma cell differentiation. The differential diagnosis included lymphoplasmacytic lymphoma (Waldenstrom's macroglobulinemia), marginal zone lymphoma, and splenic lymphoma. Cytogenetics were normal (46, XY).   He was diagnosed with Waldenstrom's macroglobulinemia in "years ago" after high protein was noted in his blood.  He has been followed without intervention.  IgM has been followed: 4932 on 06/12/2017, 3635 on 09/18/2017, 3368 on 12/23/2017, 1745 on 05/27/2018, 3380 on 09/21/2018 and 3545 on 12/15/2018.  SPEP has been followed (gm/dL): 1.1 on 03/13/2017, 2.6 on 06/12/2017, 2.3 on 09/18/2017, 1.8 on 12/23/2017, 0.9 on 05/27/2018, 2.0 on 09/21/2018 and 2.1 on 12/15/2018.  Kappa free light chains have been followed (mg/L): 81.4 (ratio 6.17) on 03/13/2017, 75.9 (ratio 5.71) on 12/23/2017, and 78.6 (ratio 5.78) on 12/15/2018.  He has iron deficiency anemia.  He has microscopic (collagenous) colitis.  He is treated with Entocort prn.    He states that it has been 18 months since his last flare.  EGD on 03/05/2017 revealed a small hiatal hernia, mild Schatzki ring, gastritis, and a normal examined duodenum.  EGD on 10/17/2017 revealed a small hiatal hernia, a non-obstructing Schaztki ring, and two 2-3 mm non-bleeding angiodysplastic lesions in the gastric body.  They were treated with argon plasma coagulation (APC). Clip (MR conditional) was placed. Hemostasis was very good at both treatment sites.  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  There were two non-bleeding angioectasias in the duodenum.  Colonoscopy on 03/05/2017 revealed one 3 mm polyp in the rectum, removed with a cold snare, diverticulosis in the sigmoid  colon, and internal hemorrhoids.  Small bowel series on 09/29/2017 revealed a duodenal diverticulum.  Video capsule endoscopy on 10/14/2017 revealed multiple flashes of older and fresher red blood in the gastric vault. 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  There was concern for significant pyloric channel or duodenal bulb ulcer.   He has received Ferahame on 06/05/2016, 06/12/2016, 03/18/2017, 03/25/2017, 09/25/2017, 09/23/2017, 12/26/2017, 01/02/2018, 06/03/2018, 06/10/2018, 06/26/2018, 09/25/2018, and 10/02/2018.  Ferritin has been followed: 10.6 on 09/18/2012, 13.9 on 08/03/2013, 12.7 on 02/10/2014, 15.7 on 06/15/2014, 13.9 on 04/19/2016, 21 on 09/18/2016, 28 on 11/22/2016, 17 on 03/13/2017, 22 on 06/12/2017, 18 on 09/18/2017, 15.3 on 12/17/2017, 18 on 05/0/02/2018, 19 on 03/25/2018, 14 on 05/27/2018, 219 on 06/09/2018, 106 on 06/24/2018, 29 on 09/21/2018 and 19 on 12/15/2018  He has hyponatremia and was put on a fluid restriction by nephrology.   Symptomatically, he has been severely fatigued x 3 weeks. He has a chronic productive cough and chronic headaches.  He reports a neuropathy and burning in his  feet that has been present for several years (unknown cause).   Exam reveals a tiny left axillary node and a palpable spleen tip.  Plan: 1.   Review labs from 12/14/2018. 2.   Labs today:  LDH, beta2-microglobulin, uric acid, serum viscosity, hepatitis B core antibody total, hepatitis B surface antigen, hepatitis C antibody, B12, folate, TSH. 3.   Collect 24 hour urine for UPEP and free light chains. 4.   Waldenstrom's macroglobulinemia  Discuss diagnosis, staging and management of Waldenstrom's macroglobulinemia  Contact Dr. Gari Crown, pathologist, regarding bone marrow 12/2016.  Dr Melina Copa notes at least 20% involvement.  Discuss indications for treatment: hyperviscosity, neuropathy, symptomatic adenopathy or organomegaly, cryoglobulinemia, cold  agglutinin disease, anemia/cytopenias.  Discuss options for treatment: bendamustine and Rituxan or ibrutinib.  Consider additional testing: cryoglobulins, anti-MAG/anti-GM1.  Fat pad biopsy if significant proteinuria.  Discuss consideration of CT scans (postpone secondary to cost for now). 5.   Iron deficiency anemia  Etiology felt secondary to chronic blood loss.  Patient has quiescent collagenous colitis.  Patient has known AVMs.  Discuss his history of IV iron infusions (every 3 months).  Ferritin goal 100.  Feraheme today and in 1 week.  Follow-up with the GI [facility]- Dr Gustavo Lah and Stephens November, NP. 6.  Macrocytic RBC indices  Etiology unknown.  First noted on 10/218.  Spider angiomas may indicate liver disease.  Labs today:  B12, folate, TSH. 7.   Fatigue  Etiology likely multi-factorial  Await work-up above.  Correct known abnormalities to see if symptoms improve. 8.   Health maintenance  Discuss low dose screening chest CT secondary to smoking history- patient declines. 9.   RTC in 1 week for MD assessment, review of labs, and Feraheme.  I discussed the assessment and treatment plan with the patient.  The patient was provided an opportunity to ask questions and all were answered.  The patient agreed with the plan and demonstrated an understanding of the instructions.  The patient was advised to call back if the symptoms worsen or if the condition fails to improve as anticipated.  I provided 40 minutes of face-to-face time during this this encounter and > 50% was spent counseling as documented under my assessment and plan.    Nolon Stalls, MD, PhD    12/22/2018, 11:37 AM  I, Molly Dorshimer, am acting as Education administrator for [employer]. Mike Gip, MD, PhD.  I, Bentli Llorente C. Mike Gip, MD, have reviewed the above documentation for accuracy and completeness, and I agree with the above.

## 2018-12-22 ENCOUNTER — Inpatient Hospital Stay: Payer: PPO

## 2018-12-22 ENCOUNTER — Encounter: Payer: Self-pay | Admitting: Hematology and Oncology

## 2018-12-22 ENCOUNTER — Inpatient Hospital Stay: Payer: PPO | Attending: Hematology and Oncology | Admitting: Hematology and Oncology

## 2018-12-22 VITALS — BP 121/69 | HR 54 | Temp 97.7°F | Resp 18 | Wt 186.1 lb

## 2018-12-22 VITALS — BP 136/82 | HR 56 | Resp 18

## 2018-12-22 DIAGNOSIS — Z79899 Other long term (current) drug therapy: Secondary | ICD-10-CM | POA: Diagnosis not present

## 2018-12-22 DIAGNOSIS — K449 Diaphragmatic hernia without obstruction or gangrene: Secondary | ICD-10-CM

## 2018-12-22 DIAGNOSIS — R531 Weakness: Secondary | ICD-10-CM | POA: Insufficient documentation

## 2018-12-22 DIAGNOSIS — G629 Polyneuropathy, unspecified: Secondary | ICD-10-CM | POA: Insufficient documentation

## 2018-12-22 DIAGNOSIS — Z87891 Personal history of nicotine dependence: Secondary | ICD-10-CM | POA: Diagnosis not present

## 2018-12-22 DIAGNOSIS — C880 Waldenstrom macroglobulinemia: Secondary | ICD-10-CM

## 2018-12-22 DIAGNOSIS — R5383 Other fatigue: Secondary | ICD-10-CM | POA: Diagnosis not present

## 2018-12-22 DIAGNOSIS — I10 Essential (primary) hypertension: Secondary | ICD-10-CM | POA: Diagnosis not present

## 2018-12-22 DIAGNOSIS — D509 Iron deficiency anemia, unspecified: Secondary | ICD-10-CM | POA: Insufficient documentation

## 2018-12-22 DIAGNOSIS — E7800 Pure hypercholesterolemia, unspecified: Secondary | ICD-10-CM

## 2018-12-22 DIAGNOSIS — K766 Portal hypertension: Secondary | ICD-10-CM | POA: Insufficient documentation

## 2018-12-22 DIAGNOSIS — M5136 Other intervertebral disc degeneration, lumbar region: Secondary | ICD-10-CM

## 2018-12-22 DIAGNOSIS — J449 Chronic obstructive pulmonary disease, unspecified: Secondary | ICD-10-CM | POA: Diagnosis not present

## 2018-12-22 DIAGNOSIS — K219 Gastro-esophageal reflux disease without esophagitis: Secondary | ICD-10-CM | POA: Diagnosis not present

## 2018-12-22 DIAGNOSIS — E871 Hypo-osmolality and hyponatremia: Secondary | ICD-10-CM | POA: Insufficient documentation

## 2018-12-22 DIAGNOSIS — K589 Irritable bowel syndrome without diarrhea: Secondary | ICD-10-CM | POA: Diagnosis not present

## 2018-12-22 DIAGNOSIS — G4730 Sleep apnea, unspecified: Secondary | ICD-10-CM | POA: Diagnosis not present

## 2018-12-22 DIAGNOSIS — I2510 Atherosclerotic heart disease of native coronary artery without angina pectoris: Secondary | ICD-10-CM | POA: Diagnosis not present

## 2018-12-22 DIAGNOSIS — F329 Major depressive disorder, single episode, unspecified: Secondary | ICD-10-CM | POA: Insufficient documentation

## 2018-12-22 DIAGNOSIS — E785 Hyperlipidemia, unspecified: Secondary | ICD-10-CM | POA: Diagnosis not present

## 2018-12-22 DIAGNOSIS — Z7901 Long term (current) use of anticoagulants: Secondary | ICD-10-CM | POA: Insufficient documentation

## 2018-12-22 DIAGNOSIS — Z86010 Personal history of colonic polyps: Secondary | ICD-10-CM | POA: Insufficient documentation

## 2018-12-22 DIAGNOSIS — M549 Dorsalgia, unspecified: Secondary | ICD-10-CM | POA: Insufficient documentation

## 2018-12-22 DIAGNOSIS — D539 Nutritional anemia, unspecified: Secondary | ICD-10-CM

## 2018-12-22 DIAGNOSIS — D500 Iron deficiency anemia secondary to blood loss (chronic): Secondary | ICD-10-CM

## 2018-12-22 LAB — FOLATE: Folate: 6.6 ng/mL (ref >5.9)

## 2018-12-22 LAB — VITAMIN B12: Vitamin B-12: 1348 pg/mL — ABNORMAL HIGH (ref 180–914)

## 2018-12-22 LAB — URIC ACID: Uric Acid, Serum: 7.1 mg/dL (ref 3.7–8.6)

## 2018-12-22 LAB — TSH: TSH: 1.754 u[IU]/mL (ref 0.350–4.500)

## 2018-12-22 LAB — LACTATE DEHYDROGENASE: LDH: 106 U/L (ref 98–192)

## 2018-12-22 MED ORDER — SODIUM CHLORIDE 0.9 % IV SOLN
Freq: Once | INTRAVENOUS | Status: AC
  Administered 2018-12-22: 12:00:00 via INTRAVENOUS
  Filled 2018-12-22: qty 250

## 2018-12-22 MED ORDER — SODIUM CHLORIDE 0.9 % IV SOLN
510.0000 mg | Freq: Once | INTRAVENOUS | Status: AC
  Administered 2018-12-22: 510 mg via INTRAVENOUS
  Filled 2018-12-22: qty 17

## 2018-12-22 NOTE — Progress Notes (Signed)
Pt here for follow up. Previous Dr. Grayland Ormond patient. Denies any concerns at this time.

## 2018-12-22 NOTE — Patient Instructions (Signed)

## 2018-12-23 ENCOUNTER — Other Ambulatory Visit: Payer: Self-pay | Admitting: Internal Medicine

## 2018-12-23 LAB — HEPATITIS B SURFACE ANTIGEN: Hepatitis B Surface Ag: NEGATIVE

## 2018-12-23 LAB — BETA 2 MICROGLOBULIN, SERUM: Beta-2 Microglobulin: 3.4 mg/L — ABNORMAL HIGH (ref 0.6–2.4)

## 2018-12-23 LAB — HEPATITIS C ANTIBODY: HCV Ab: <0.1 s/co ratio (ref 0.0–0.9)

## 2018-12-23 LAB — VISCOSITY, SERUM: Viscosity, Serum: 2.4 rel.saline — ABNORMAL HIGH (ref 1.6–1.9)

## 2018-12-23 LAB — HEPATITIS B CORE ANTIBODY, TOTAL: Hep B Core Total Ab: NEGATIVE

## 2018-12-24 ENCOUNTER — Other Ambulatory Visit: Payer: Self-pay

## 2018-12-24 DIAGNOSIS — C8800 Waldenstrom macroglobulinemia not having achieved remission: Secondary | ICD-10-CM

## 2018-12-25 ENCOUNTER — Ambulatory Visit: Payer: PPO | Admitting: Hematology and Oncology

## 2018-12-25 ENCOUNTER — Ambulatory Visit: Payer: PPO

## 2018-12-25 ENCOUNTER — Other Ambulatory Visit: Payer: Self-pay | Admitting: Internal Medicine

## 2018-12-25 LAB — IFE+PROTEIN ELECTRO, 24-HR UR
% BETA, Urine: 22 %
ALPHA 1 URINE: 3.7 %
Albumin, U: 17.8 %
Alpha 2, Urine: 9.5 %
GAMMA GLOBULIN URINE: 47 %
M-SPIKE %, Urine: 24.3 % — ABNORMAL HIGH
M-Spike, Mg/24 Hr: <26 mg/24 hr — ABNORMAL HIGH
Total Protein, Urine-Ur/day: <108 mg/24 hr (ref 30–150)
Total Protein, Urine: <4 mg/dL
Total Volume: 2700

## 2018-12-28 NOTE — Progress Notes (Signed)
[facility]  [address] [place], [place] [address] Phone: [phone]  Fax: [phone]   Clinic Day:  12/29/2018  Referring physician: Einar Pheasant, MD  Chief Complaint: Troy Lopez is a 78 y.o. male with with [name]'s macroglobulinemia and iron deficiency anemia who is seen for 1 week assessment.  HPI: The patient was last seen in the hematology clinic on 12/22/2018. At that time, he had been severely fatigued x 3 weeks. He had a chronic productive cough and chronic headaches.  He reported a neuropathy and burning in his feet that had been present for several years (unknown cause).   Exam revealed a tiny left axillary node and a palpable spleen tip.  Hematocrit was 33.6, hemoglobin 11.2, and MCV 105.  Ferritin was 19 with an iron saturation of 5%.  He received IV Feraheme.   SPEP on 12/15/2018 revealed an M-spike of 2.1.  Kappa free light chains were 78.6, lambda free light chains 13.6 with a ratio of 5.78 (0.26 - 1.65).  IgM was 3545.  Creatinine was 1.10.  Calcium was 9.2.  Sodium was 127.  He underwent an additional work-up.  Serum viscosity was 2.4 (1.6-1.9).  LDH was 106.  Beta-2 microglobulin was 3.4 (0.6-2.4).  Uric acid was normal.  B12, folate, and TSH were normal.  Hepatitis B surface antigen, hepatitis B core antibody, and hepatitis C antibody were negative.  24 hour UPEP on 12/24/2018 revealed M-spike of 24.3%, <26 mg/24 hr Bence Jones protein, kappa type.   During the interim, he reports he is "not doing good at all." He slept all night but feels like he did not sleep at all.  He has a sleep apnea but does not use his CPAP machine because he "wakes up and can't breath."  He is not interested in trying to get it fixed.   He notes intermittent positional numbness in his hands and feet. He denies any diarrhea. He easily bruises on his arms.  He last saw his ophthalmologist, at [facility], 3-4 months ago. He did not have  glaucoma at that time.    Past Medical History:  Diagnosis Date   Adrenal gland anomaly    enlargement   Anxiety    CAD (coronary artery disease)    Carpal tunnel syndrome    Chronic hyponatremia    Colitis    Colonic polyp    COPD (chronic obstructive pulmonary disease) (HCC)    Degenerative disc disease, lumbar    Depression    Diverticulosis    Dyspnea    GERD (gastroesophageal reflux disease)    H/O degenerative disc disease    Hypercholesterolemia    Hyperkalemia    Hyperlipidemia    Hypertension    Irritable bowel syndrome    Monoclonal gammopathy    Monoclonal gammopathy    Neuropathy    Personal history of tobacco use, presenting hazards to health 08/17/2015   Sleep apnea    Vertebral compression fracture [facility])     Past Surgical History:  Procedure Laterality Date   BUNIONECTOMY  1989   CARPAL TUNNEL RELEASE Left 2011   ulnar nerve sub muscular at elbow   CHOLECYSTECTOMY  09/07   COLONOSCOPY WITH PROPOFOL N/A 03/05/2017   Procedure: COLONOSCOPY WITH PROPOFOL;  Surgeon: Lucilla Lame, MD;  Location: [facility] ENDOSCOPY;  Service: Endoscopy;  Laterality: N/A;   ELECTROPHYSIOLOGIC STUDY N/A 11/15/2015   Procedure: CARDIOVERSION;  Surgeon: Isaias Cowman, MD;  Location: [facility] ORS;  Service: Cardiovascular;  Laterality:  N/A;   ESOPHAGOGASTRODUODENOSCOPY (EGD) WITH PROPOFOL N/A 03/05/2017   Procedure: ESOPHAGOGASTRODUODENOSCOPY (EGD) WITH PROPOFOL;  Surgeon: Lucilla Lame, MD;  Location: [facility] ENDOSCOPY;  Service: Endoscopy;  Laterality: N/A;   ESOPHAGOGASTRODUODENOSCOPY (EGD) WITH PROPOFOL N/A 10/17/2017   Procedure: ESOPHAGOGASTRODUODENOSCOPY (EGD) WITH PROPOFOL;  Surgeon: Lollie Sails, MD;  Location: [facility] ENDOSCOPY;  Service: Endoscopy;  Laterality: N/A;   EYE SURGERY Bilateral 2010   cataract   HEMORRHOID SURGERY     KYPHOPLASTY N/A 11/04/2016   Procedure: KYPHOPLASTY T 12;  Surgeon: Hessie Knows, MD;  Location: [facility] ORS;   Service: Orthopedics;  Laterality: N/A;    Family History  Problem Relation Age of Onset   Stroke Mother    Heart disease Father        MI - 24    Colon cancer Neg Hx    Prostate cancer Neg Hx     Social History:  reports that he quit smoking about 3 years ago. His smoking use included cigarettes. He has never used smokeless tobacco. He reports current alcohol use of about 42.0 standard drinks of alcohol per week. He reports that he does not use drugs.  At one time, he was smoking 2 packs/day.  He smoked for 60 years.  He drinks 6 beers per day and has for several years. He smoked 2 packs per day for 60 years and has quit and restarted several times.  He is retired but previously worked in Therapist, art for a Clinical cytogeneticist. He denies any known exposure to radiation or toxins. The patient is alone today.  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   Not to be given due to Waldenstrom's syndrome, told it could affect kidney function    Current Medications: Current Outpatient Medications  Medication Sig Dispense Refill   alendronate (FOSAMAX) 70 MG tablet Take 70 mg by mouth once a week. Take with a full glass of water on an empty stomach. Takes on Sundays     amLODipine (NORVASC) 10 MG tablet Take 1 tablet (10 mg total) by mouth daily. 30 tablet 2   azelastine (ASTELIN) 0.1 % nasal spray Place 1 spray into both nostrils 2 (two) times daily. Use in each nostril as directed 30 mL 1   BREO ELLIPTA 100-25 MCG/INH AEPB INHALE 1 PUFF INTO LUNGS DAILY. 60 each 0   calcium-vitamin D (OSCAL WITH D) 500-200 MG-UNIT tablet Take 2 tablets by mouth daily.     ELIQUIS 5 MG TABS tablet Take 5 mg by mouth 2 (two) times daily.      FIBER PO Take 1 capsule by mouth daily.      fluticasone (FLONASE) 50 MCG/ACT nasal spray Place 2 sprays into both nostrils daily. 16 g 0     furosemide (LASIX) 20 MG tablet Take 1 tablet (20 mg total) by mouth daily as needed. (Patient taking differently: Take 40 mg by mouth daily. ) 30 tablet 0   gabapentin (NEURONTIN) 100 MG capsule Take 4 capsules (400 mg) daily in the morning and midday. Take 8 capsules (800 mg) daily in the evening. 480 capsule 1   isosorbide mononitrate (IMDUR) 30 MG 24 hr tablet Take 1 tablet (30 mg total) by mouth once daily.     metoprolol tartrate (LOPRESSOR) 100 MG tablet Take 1 tablet (100 mg total) by mouth 2 (two) times daily. (Patient taking differently: Take 50 mg by mouth 2 (two) times daily. )  60 tablet 2   mupirocin ointment (BACTROBAN) 2 % Apply to affected area bid. 22 g 0   pantoprazole (PROTONIX) 40 MG tablet Take 1 tablet (40 mg total) by mouth 2 (two) times daily. 180 tablet 0   sertraline (ZOLOFT) 50 MG tablet Take 1 tablet (50 mg total) by mouth daily. 30 tablet 0   vitamin B-12 (CYANOCOBALAMIN) 1000 MCG tablet Take 1,000 mcg by mouth daily.      acetaminophen (TYLENOL) 500 MG tablet Take 500-1,000 mg by mouth 3 (three) times daily as needed for moderate pain or headache.     albuterol (PROVENTIL HFA;VENTOLIN HFA) 108 (90 Base) MCG/ACT inhaler Inhale 2 puffs into the lungs every 6 (six) hours as needed for wheezing or shortness of breath. (Patient not taking: Reported on 12/29/2018) 1 Inhaler 10   budesonide (ENTOCORT EC) 3 MG 24 hr capsule Take 9 mg by mouth daily as needed (colitis flare).      docusate sodium (COLACE) 100 MG capsule 100 mg daily as needed.      promethazine (PHENERGAN) 12.5 MG tablet Take 12.5 mg by mouth every 6 (six) hours as needed for nausea or vomiting.     No current facility-administered medications for this visit.    Review of Systems  Constitutional: Positive for malaise/fatigue. Negative for chills, diaphoresis, fever and weight loss.       "Not doing good at all." Weight stable.  HENT: Positive for hearing loss (hearing aids). Negative for  congestion, ear discharge, ear pain, nosebleeds, sinus pain, sore throat and tinnitus.        Runny nose often  Eyes: Negative for blurred vision, double vision, photophobia and pain.  Respiratory: Positive for cough (COPD, productive) and sputum production ("clear yuck"). Negative for hemoptysis, shortness of breath and wheezing.   Cardiovascular: Negative.  Negative for chest pain, palpitations, orthopnea, leg swelling and PND.  Gastrointestinal: Negative.  Negative for abdominal pain, blood in stool, constipation, diarrhea, heartburn, melena, nausea and vomiting.       Daily BMs. Decreased appetite.   Genitourinary: Negative.  Negative for dysuria, frequency, hematuria and urgency.  Musculoskeletal: Positive for back pain (chronic- comes and goes; known stenosis). Negative for falls, joint pain, myalgias and neck pain.  Skin: Negative.  Negative for itching and rash.  Neurological: Positive for sensory change (neuropathy in feet, unknown cause), weakness and headaches (often, for several years). Negative for dizziness, tremors, speech change, focal weakness and seizures.  Endo/Heme/Allergies: Positive for environmental allergies. Bruises/bleeds easily (easily bruises on arms).       No diabetes.  Psychiatric/Behavioral: Negative for depression and memory loss. The patient is not nervous/anxious and does not have insomnia.    Performance status (ECOG): 1-2  Physical Exam  Constitutional: He is oriented to person, place, and time. He appears well-developed and well-nourished. No distress.  HENT:  Head: Normocephalic and atraumatic.  Gray hair and beard.  Eyes: Conjunctivae and EOM are normal. No scleral icterus.  Glasses.  Blue/brown eyes.  Neurological: He is alert and oriented to person, place, and time.  Skin: Skin is warm. Bruising (bilateral arms) noted. No rash noted. He is not diaphoretic. No erythema.  Scattered spider angiomas.  Psychiatric: He has a normal mood and affect. His  behavior is normal. Judgment and thought content normal.  Nursing note and vitals reviewed.   No visits with results within 3 Day(s) from this visit.  Latest known visit with results is:  Orders Only on 12/24/2018  Component Date Value Ref  Range Status   Total Protein, Urine 12/24/2018 <4.0  Not Estab. mg/dL Final   Total Protein, Urine-Ur/day 12/24/2018 <108  30 - 150 mg/24 hr Final   Albumin, U 12/24/2018 17.8  % Final   ALPHA 1 URINE 12/24/2018 3.7  % Final   Alpha 2, Urine 12/24/2018 9.5  % Final   % BETA, Urine 12/24/2018 22.0  % Final   GAMMA GLOBULIN URINE 12/24/2018 47.0  % Final   M-SPIKE %, Urine 12/24/2018 24.3* Not Observed % Final   M-Spike, Mg/24 Hr 12/24/2018 <26* Not Observed mg/24 hr Final   Immunofixation Result, Urine 12/24/2018 Comment   Final   Bence Jones Protein positive; kappa type.   Note: 12/24/2018 Comment   Final   Comment: (NOTE) Protein electrophoresis scan will follow via computer, mail, or courier delivery. Performed At: [facility] [place], [place] [id number] Rush Farmer MD VV:[id number]    Total Volume 12/24/2018 2,700   Final   Performed at [facility], [address] [place],  [address]    Assessment:  Troy Lopez is a 78 y.o. male with [name]'s macroglobulinemia and iron deficiency anemia.  Bone marrow biopsy on 01/08/2017 revealed approximately 20% of a low grade B-cell lymphoproliferative process associated with plasma cell differentiation. The differential diagnosis included lymphoplasmacytic lymphoma (Waldenstrom's macroglobulinemia), marginal zone lymphoma, and splenic lymphoma. Cytogenetics were normal (46, XY).   He was diagnosed with Waldenstrom's macroglobulinemia in "years ago" after high protein was noted in his blood.  He has been followed without intervention.  IgM has been followed: 4932 on 06/12/2017, 3635 on 09/18/2017, 3368 on 12/23/2017, 1745 on 05/27/2018,  3380 on 09/21/2018 and 3545 on 12/15/2018.  SPEP has been followed (gm/dL): 1.1 on 03/13/2017, 2.6 on 06/12/2017, 2.3 on 09/18/2017, 1.8 on 12/23/2017, 0.9 on 05/27/2018, 2.0 on 09/21/2018 and 2.1 on 12/15/2018.  Kappa free light chains have been followed (mg/L): 81.4 (ratio 6.17) on 03/13/2017, 75.9 (ratio 5.71) on 12/23/2017, and 78.6 (ratio 5.78) on 12/15/2018.  He has iron deficiency anemia.  He has microscopic (collagenous) colitis.  He is treated with Entocort prn.    He states that it has been 18 months since his last flare.  EGD on 03/05/2017 revealed a small hiatal hernia, mild Schatzki ring, gastritis, and a normal examined duodenum.  EGD on 10/17/2017 revealed a small hiatal hernia, a non-obstructing Schaztki ring, and two 2-3 mm non-bleeding angiodysplastic lesions in the gastric body.  They were treated with argon plasma coagulation (APC). Clip (MR conditional) was placed. Hemostasis was very good at both treatment sites.  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  There were two non-bleeding angioectasias in the duodenum.  Colonoscopy on 03/05/2017 revealed one 3 mm polyp in the rectum, removed with a cold snare, diverticulosis in the sigmoid colon, and internal hemorrhoids.  Small bowel series on 09/29/2017 revealed a duodenal diverticulum.  Video capsule endoscopy on 10/14/2017 revealed multiple flashes of older and fresher red blood in the gastric vault. 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  There was concern for significant pyloric channel or duodenal bulb ulcer.   He has received Ferahame on 06/05/2016, 06/12/2016, 03/18/2017, 03/25/2017, 09/25/2017, 09/23/2017, 12/26/2017, 01/02/2018, 06/03/2018, 06/10/2018, 06/26/2018, 09/25/2018, and 10/02/2018.  Ferritin has been followed: 10.6 on 09/18/2012, 13.9 on 08/03/2013, 12.7 on 02/10/2014, 15.7 on 06/15/2014, 13.9 on 04/19/2016, 21 on 09/18/2016, 28 on 11/22/2016, 17 on  03/13/2017, 22 on 06/12/2017, 18 on 09/18/2017, 15.3 on 12/17/2017, 18  on 05/0/02/2018, 19 on 03/25/2018, 14 on 05/27/2018, 219 on 06/09/2018, 106 on 06/24/2018, 29 on 09/21/2018 and 19 on 12/15/2018  He has hyponatremia and was put on a fluid restriction by nephrology.   He has an extensive smoking history.  He declined low dose chest CT screening.  Symptomatically, he remains fatigued.  He has sleep apnea.  He does not use his CPAP machine.  Plan: 1.   Review labs from 12/14/2018 and 12/25/2018. 2.   Labs today: anti-MAG and anti-GM1. 3.   Waldenstrom's macroglobulinemia             Re-review indications for treatment:   Hyperviscosity: oronasal bleeding, visual changes, headache, dizziness/vertigo, paresthesias.    No apparent symptoms.  Doubt significance of  "headaches off/on for years".    Discuss eye exam for IgM > 3000 to evaluate for retinal vein engorgement, flame hemorrhages, papilledema.    Patient notes eye exam 3-4 months ago without abnormality.   B symptoms:  fevers, sweats, weight loss PLUS fatigue.    Patient has only fatigue of unclear etiology.   Anemia (hemoglobin < 10) and thrombocytopenia (platelets < 100,000)    Counts adequate.    Patient must have no other cause of anemia (ie, iron deficiency).   Cryoglobulinemia: purpura, digital ischemia, arthralgia, Raynaud's.    No symptoms.    Neuropathy    Patient has a neuropathy of unclear etiology    Discuss sending off anti-MAG/anti-GM1.    Patient in agreement to testing.   No symptoms of amyloid     Bulky adenopathy (>= 5 cm) or symptomatic hepatosplenomegaly.    Exam revealed a palpable spleen tip.    Discuss consideration of CT scans.    Patient would like to defer.            Review bone marrow results from 12/2016.  Dr Melina Copa notes at least 20% involvement. 4.   Iron deficiency anemia             Hematocrit was 33.6 and hemoglobin 11.2 on 12/15/2018.  Ferritin was 19 and iron saturation 5% on  12/15/2018.  Etiology felt secondary to chronic blood loss secondary to    collagenous colitis and AVMs             He has received IV iron every 3 months.             Ferritin goal 100.             He received Feraheme last week.  Plan Feraheme today to replete iron stores.              Follow-up with the [facility] (Dr Gustavo Lah and [place] November, NP). 5.  Macrocytic RBC indices             Hematocrit 33.6.  Hemoglobin 11.2 .  MCV 105.  Etiology unknown.  First noted on 05/2017.             Spider angiomas may indicate liver disease.             B12, folate, and TSH were normal on 12/22/2018. 6.   Fatigue             Etiology likely multi-factorial             Unclear if any component due to Waldenstrom's macroglobulinemia.  Discuss addressing sleep apnea and CPAP which he is not suing- patient is not interested. 7.   RTC in 9 weeks for labs (  CBC with diff, ferritin, iron studies). 8.   RTC in 16 weeks for MD assessment, labs (CBC with diff, CMP, SPEP, IgM, beta-2 microglobulin- day before), and +/- Feraheme.  I discussed the assessment and treatment plan with the patient.  The patient was provided an opportunity to ask questions and all were answered.  The patient agreed with the plan and demonstrated an understanding of the instructions.  The patient was advised to call back if the symptoms worsen or if the condition fails to improve as anticipated.  I provided 25 minutes of face-to-face time during this this encounter and > 50% was spent counseling as documented under my assessment and plan.    Lequita Asal, MD, PhD    12/29/2018, 11:09 AM  I, Molly Dorshimer, am acting as Education administrator for [employer]. Mike Gip, MD, PhD.  I, Jami Ohlin C. Mike Gip, MD, have reviewed the above documentation for accuracy and completeness, and I agree with the above.

## 2018-12-29 ENCOUNTER — Inpatient Hospital Stay: Payer: PPO

## 2018-12-29 ENCOUNTER — Other Ambulatory Visit: Payer: Self-pay | Admitting: Hematology and Oncology

## 2018-12-29 ENCOUNTER — Inpatient Hospital Stay (HOSPITAL_BASED_OUTPATIENT_CLINIC_OR_DEPARTMENT_OTHER): Payer: PPO | Admitting: Hematology and Oncology

## 2018-12-29 ENCOUNTER — Other Ambulatory Visit: Payer: Self-pay

## 2018-12-29 ENCOUNTER — Encounter: Payer: Self-pay | Admitting: Hematology and Oncology

## 2018-12-29 VITALS — BP 157/83 | HR 64 | Temp 97.8°F | Resp 20

## 2018-12-29 VITALS — BP 135/80 | HR 55 | Temp 98.4°F | Resp 18 | Ht 71.0 in | Wt 186.4 lb

## 2018-12-29 DIAGNOSIS — K589 Irritable bowel syndrome without diarrhea: Secondary | ICD-10-CM

## 2018-12-29 DIAGNOSIS — C880 Waldenstrom macroglobulinemia: Secondary | ICD-10-CM | POA: Diagnosis not present

## 2018-12-29 DIAGNOSIS — I2510 Atherosclerotic heart disease of native coronary artery without angina pectoris: Secondary | ICD-10-CM | POA: Diagnosis not present

## 2018-12-29 DIAGNOSIS — D509 Iron deficiency anemia, unspecified: Secondary | ICD-10-CM

## 2018-12-29 DIAGNOSIS — K449 Diaphragmatic hernia without obstruction or gangrene: Secondary | ICD-10-CM

## 2018-12-29 DIAGNOSIS — K219 Gastro-esophageal reflux disease without esophagitis: Secondary | ICD-10-CM

## 2018-12-29 DIAGNOSIS — Z87891 Personal history of nicotine dependence: Secondary | ICD-10-CM

## 2018-12-29 DIAGNOSIS — F329 Major depressive disorder, single episode, unspecified: Secondary | ICD-10-CM

## 2018-12-29 DIAGNOSIS — Z7901 Long term (current) use of anticoagulants: Secondary | ICD-10-CM

## 2018-12-29 DIAGNOSIS — G4730 Sleep apnea, unspecified: Secondary | ICD-10-CM

## 2018-12-29 DIAGNOSIS — R531 Weakness: Secondary | ICD-10-CM | POA: Diagnosis not present

## 2018-12-29 DIAGNOSIS — E7800 Pure hypercholesterolemia, unspecified: Secondary | ICD-10-CM

## 2018-12-29 DIAGNOSIS — E871 Hypo-osmolality and hyponatremia: Secondary | ICD-10-CM

## 2018-12-29 DIAGNOSIS — E785 Hyperlipidemia, unspecified: Secondary | ICD-10-CM

## 2018-12-29 DIAGNOSIS — M549 Dorsalgia, unspecified: Secondary | ICD-10-CM

## 2018-12-29 DIAGNOSIS — G629 Polyneuropathy, unspecified: Secondary | ICD-10-CM

## 2018-12-29 DIAGNOSIS — D539 Nutritional anemia, unspecified: Secondary | ICD-10-CM

## 2018-12-29 DIAGNOSIS — R5383 Other fatigue: Secondary | ICD-10-CM

## 2018-12-29 DIAGNOSIS — M5136 Other intervertebral disc degeneration, lumbar region: Secondary | ICD-10-CM

## 2018-12-29 DIAGNOSIS — J449 Chronic obstructive pulmonary disease, unspecified: Secondary | ICD-10-CM | POA: Diagnosis not present

## 2018-12-29 DIAGNOSIS — K766 Portal hypertension: Secondary | ICD-10-CM | POA: Diagnosis not present

## 2018-12-29 DIAGNOSIS — Z79899 Other long term (current) drug therapy: Secondary | ICD-10-CM

## 2018-12-29 DIAGNOSIS — Z86010 Personal history of colonic polyps: Secondary | ICD-10-CM

## 2018-12-29 DIAGNOSIS — I10 Essential (primary) hypertension: Secondary | ICD-10-CM

## 2018-12-29 MED ORDER — SODIUM CHLORIDE 0.9 % IV SOLN
Freq: Once | INTRAVENOUS | Status: AC
  Administered 2018-12-29: 12:00:00 via INTRAVENOUS
  Filled 2018-12-29: qty 250

## 2018-12-29 MED ORDER — SODIUM CHLORIDE 0.9 % IV SOLN
510.0000 mg | Freq: Once | INTRAVENOUS | Status: AC
  Administered 2018-12-29: 510 mg via INTRAVENOUS
  Filled 2018-12-29: qty 17

## 2018-12-29 NOTE — Progress Notes (Signed)
No new changes noted today 

## 2018-12-30 LAB — MISC LABCORP TEST (SEND OUT): Labcorp test code: 140385

## 2019-01-01 DIAGNOSIS — G629 Polyneuropathy, unspecified: Secondary | ICD-10-CM | POA: Insufficient documentation

## 2019-01-05 LAB — ANTI-MYELIN ASSOC GLYCOP IGG: Anti-Myelin Assoc Glycop IgG: <1:10 {titer}

## 2019-01-08 DIAGNOSIS — M810 Age-related osteoporosis without current pathological fracture: Secondary | ICD-10-CM | POA: Diagnosis not present

## 2019-01-14 ENCOUNTER — Other Ambulatory Visit: Payer: Self-pay | Admitting: Internal Medicine

## 2019-01-15 DIAGNOSIS — M810 Age-related osteoporosis without current pathological fracture: Secondary | ICD-10-CM | POA: Diagnosis not present

## 2019-01-26 DIAGNOSIS — M8588 Other specified disorders of bone density and structure, other site: Secondary | ICD-10-CM | POA: Diagnosis not present

## 2019-02-01 ENCOUNTER — Other Ambulatory Visit: Payer: Self-pay | Admitting: Internal Medicine

## 2019-02-09 DIAGNOSIS — I272 Pulmonary hypertension, unspecified: Secondary | ICD-10-CM | POA: Diagnosis not present

## 2019-02-09 DIAGNOSIS — I2510 Atherosclerotic heart disease of native coronary artery without angina pectoris: Secondary | ICD-10-CM | POA: Diagnosis not present

## 2019-02-09 DIAGNOSIS — E7800 Pure hypercholesterolemia, unspecified: Secondary | ICD-10-CM | POA: Diagnosis not present

## 2019-02-09 DIAGNOSIS — Z9889 Other specified postprocedural states: Secondary | ICD-10-CM | POA: Diagnosis not present

## 2019-02-09 DIAGNOSIS — R609 Edema, unspecified: Secondary | ICD-10-CM | POA: Diagnosis not present

## 2019-02-09 DIAGNOSIS — R0602 Shortness of breath: Secondary | ICD-10-CM | POA: Diagnosis not present

## 2019-02-09 DIAGNOSIS — I10 Essential (primary) hypertension: Secondary | ICD-10-CM | POA: Diagnosis not present

## 2019-02-09 DIAGNOSIS — Z720 Tobacco use: Secondary | ICD-10-CM | POA: Diagnosis not present

## 2019-02-10 DIAGNOSIS — K52831 Collagenous colitis: Secondary | ICD-10-CM | POA: Diagnosis not present

## 2019-02-13 ENCOUNTER — Other Ambulatory Visit: Payer: Self-pay | Admitting: Pulmonary Disease

## 2019-02-15 ENCOUNTER — Other Ambulatory Visit: Payer: Self-pay | Admitting: Pulmonary Disease

## 2019-02-15 MED ORDER — BREO ELLIPTA 100-25 MCG/INH IN AEPB: INHALATION_SPRAY | RESPIRATORY_TRACT | 0 refills | Status: DC

## 2019-02-15 NOTE — Telephone Encounter (Signed)
Received Rx request for Breo 100 from [facility]. One Rx for Breo 100 has been sent to preferred pharmacy, as pt was instructed to continue this medication at last OV and f/u around 10/2018. Sent note to pharmacy that appt is needed for further refills.  Nothing further is needed.

## 2019-02-17 ENCOUNTER — Telehealth: Payer: Self-pay | Admitting: Internal Medicine

## 2019-02-17 NOTE — Telephone Encounter (Signed)
Pt called today. He would like to talk to you about his medications. Pt states that you were a huge help last year getting his medications. I could not schedule an appt, your schedule is not opened. He would like a call from you. His number is [phone].

## 2019-02-17 NOTE — Telephone Encounter (Signed)
Will call on Monday when I am in clinic.   Catie [name], PharmD, [place] PGY2 Ambulatory Care Pharmacy Resident, [place] [facility] Phone: [phone]

## 2019-02-18 DIAGNOSIS — H903 Sensorineural hearing loss, bilateral: Secondary | ICD-10-CM | POA: Diagnosis not present

## 2019-02-22 ENCOUNTER — Ambulatory Visit: Payer: Self-pay | Admitting: Pharmacist

## 2019-02-22 DIAGNOSIS — I2510 Atherosclerotic heart disease of native coronary artery without angina pectoris: Secondary | ICD-10-CM

## 2019-02-22 DIAGNOSIS — E785 Hyperlipidemia, unspecified: Secondary | ICD-10-CM

## 2019-02-22 DIAGNOSIS — I4892 Unspecified atrial flutter: Secondary | ICD-10-CM

## 2019-02-22 DIAGNOSIS — J449 Chronic obstructive pulmonary disease, unspecified: Secondary | ICD-10-CM

## 2019-02-22 NOTE — Progress Notes (Addendum)
  Care Management Note   Troy Lopez is a 78 y.o. year old male who is a primary care patient of Einar Pheasant, MD. The CM team was consulted for assistance with chronic disease management and care coordination.   Received message that patient wanted to discuss medication assistance. We applied for [employer] and [employer] assistance for 2019.   I reached out to [employer] by phone today. He reports having spent the necessary amount for [place]; potentially for BMS. Will begin the process for assistance application today. Patient will bring to clinic a copy of his 2019 tax return, copy of current out of pocket spend report from the pharmacy, and sign patient portion of the two applications.   Will collaborate with Dr. Saralyn Pilar on application for Eliquis and Dr. Alva Garnet on applications for Breo and Ventolin.  We also discussed chronic care management support:   [name] was given information about Chronic Care Management services today including:  1. CCM service includes personalized support from designated clinical staff supervised by his physician, including individualized plan of care and coordination with other care providers 2. 24/7 contact phone numbers for assistance for urgent and routine care needs. 3. Service will only be billed when office clinical staff spend 20 minutes or more in a month to coordinate care. 4. Only one practitioner may furnish and bill the service in a calendar month. 5. The patient may stop CCM services at any time (effective at the end of the month) by phone call to the office staff. 6. The patient will be responsible for cost sharing (co-pay) of up to 20% of the service fee (after annual deductible is met).  Patient agreed to services and verbal consent obtained.    Review of patient status, including review of consultants reports, relevant laboratory and other test results, and collaboration with appropriate care team members and the  patient's provider was performed as part of comprehensive patient evaluation and provision of chronic care management services.   Follow Up Plan:  - Once patient and provider portions are received, will pass applications along to [facility], CPhT for submission and follow up.   Catie [name], PharmD Clinical Pharmacist [place] [phone]   Reviewed.  Dr Nicki Reaper

## 2019-02-24 ENCOUNTER — Other Ambulatory Visit: Payer: Self-pay

## 2019-02-24 DIAGNOSIS — M810 Age-related osteoporosis without current pathological fracture: Secondary | ICD-10-CM | POA: Insufficient documentation

## 2019-02-24 MED ORDER — BREO ELLIPTA 100-25 MCG/INH IN AEPB: INHALATION_SPRAY | RESPIRATORY_TRACT | 2 refills | Status: DC

## 2019-02-24 MED ORDER — ALBUTEROL SULFATE HFA 108 (90 BASE) MCG/ACT IN AERS: 2.0000 | INHALATION_SPRAY | Freq: Four times a day (QID) | RESPIRATORY_TRACT | 3 refills | Status: DC | PRN

## 2019-02-25 DIAGNOSIS — H903 Sensorineural hearing loss, bilateral: Secondary | ICD-10-CM | POA: Diagnosis not present

## 2019-02-25 DIAGNOSIS — K52831 Collagenous colitis: Secondary | ICD-10-CM | POA: Diagnosis not present

## 2019-03-01 ENCOUNTER — Telehealth: Payer: Self-pay

## 2019-03-01 ENCOUNTER — Inpatient Hospital Stay: Payer: PPO | Attending: Hematology and Oncology

## 2019-03-01 ENCOUNTER — Other Ambulatory Visit: Payer: Self-pay

## 2019-03-01 DIAGNOSIS — C880 Waldenstrom macroglobulinemia: Secondary | ICD-10-CM

## 2019-03-01 DIAGNOSIS — D539 Nutritional anemia, unspecified: Secondary | ICD-10-CM

## 2019-03-01 DIAGNOSIS — D509 Iron deficiency anemia, unspecified: Secondary | ICD-10-CM | POA: Diagnosis not present

## 2019-03-01 LAB — CBC WITH DIFFERENTIAL/PLATELET
Abs Immature Granulocytes: 0.02 10*3/uL (ref 0.00–0.07)
Basophils Absolute: 0 10*3/uL (ref 0.0–0.1)
Basophils Relative: 0 %
Eosinophils Absolute: 0 10*3/uL (ref 0.0–0.5)
Eosinophils Relative: 0 %
HCT: 38.4 % — ABNORMAL LOW (ref 39.0–52.0)
Hemoglobin: 13 g/dL (ref 13.0–17.0)
Immature Granulocytes: 0 %
Lymphocytes Relative: 21 %
Lymphs Abs: 1.5 10*3/uL (ref 0.7–4.0)
MCH: 36.4 pg — ABNORMAL HIGH (ref 26.0–34.0)
MCHC: 33.9 g/dL (ref 30.0–36.0)
MCV: 107.6 fL — ABNORMAL HIGH (ref 80.0–100.0)
Monocytes Absolute: 0.8 10*3/uL (ref 0.1–1.0)
Monocytes Relative: 11 %
Neutro Abs: 4.8 10*3/uL (ref 1.7–7.7)
Neutrophils Relative %: 68 %
Platelets: 323 10*3/uL (ref 150–400)
RBC: 3.57 MIL/uL — ABNORMAL LOW (ref 4.22–5.81)
RDW: 14.7 % (ref 11.5–15.5)
WBC: 7.1 10*3/uL (ref 4.0–10.5)
nRBC: 0 % (ref 0.0–0.2)

## 2019-03-01 LAB — IRON AND TIBC
Iron: 103 ug/dL (ref 45–182)
Saturation Ratios: 10 % — ABNORMAL LOW (ref 17.9–39.5)
TIBC: 1018 ug/dL — ABNORMAL HIGH (ref 250–450)
UIBC: 915 ug/dL

## 2019-03-01 LAB — FERRITIN: Ferritin: 39 ng/mL (ref 24–336)

## 2019-03-01 NOTE — Telephone Encounter (Signed)
Informed patient of Hgb, Ferritin, and Iron Saturation levels. Patient reports he feels great at this time and does not feel like he needs IV iron. Informed patient of next scheduled appt. And advised that he reach out should he start feeling fatigued. Patient verbalizes understanding and denies any further questions or concerns.

## 2019-03-01 NOTE — Telephone Encounter (Signed)
-----   Message from Lequita Asal, MD sent at 03/01/2019  4:49 PM EDT ----- Regarding: Please call patient  Hemoglobin is 13 (normal)- best in a long time.   Ferritin is 39 (slightly low).  Iron saturation is 10% (low).  If he is fatigued, he can receive IV iron x 1.  Otherwise, follow-up as scheduled.  M ----- Message ----- From: Buel Ream, Lab [facility] [place] Sent: 03/01/2019  10:37 AM EDT To: Lequita Asal, MD

## 2019-03-08 ENCOUNTER — Ambulatory Visit: Payer: Self-pay | Admitting: Pharmacist

## 2019-03-08 ENCOUNTER — Other Ambulatory Visit: Payer: Self-pay | Admitting: Internal Medicine

## 2019-03-08 DIAGNOSIS — J449 Chronic obstructive pulmonary disease, unspecified: Secondary | ICD-10-CM

## 2019-03-08 DIAGNOSIS — I4892 Unspecified atrial flutter: Secondary | ICD-10-CM

## 2019-03-08 IMAGING — CT CT CHEST W/O CM
1 series · 14 of 34 positions shown, 18 images · non-contrast
Comparison: 05/20/2014

CLINICAL DATA: Hyponatremia, COPD, cough, shortness of breath,
former smoker, hypertension, coronary artery disease, history of
MGUS

EXAM:
CT CHEST WITHOUT CONTRAST
TECHNIQUE: Multidetector CT imaging of the chest was performed following the
standard protocol without IV contrast. Sagittal and coronal MPR
images reconstructed from axial data set.

[Series 2: thorax · axial · 0.74mm/px · z∈[-726,-446]mm · 14 of 166 slices shown, 18 images]
[im 13/166  mediastinal]
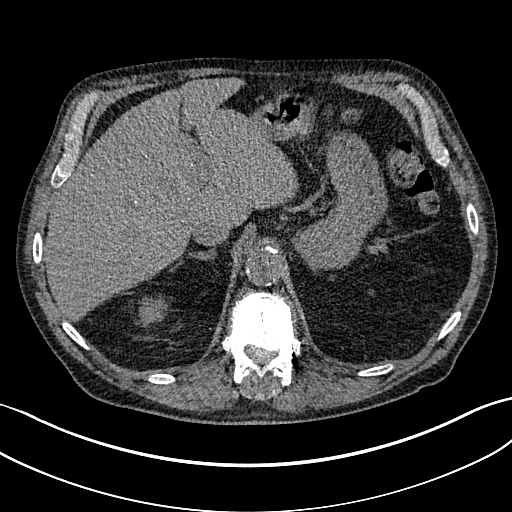
[im 13/166  lung]
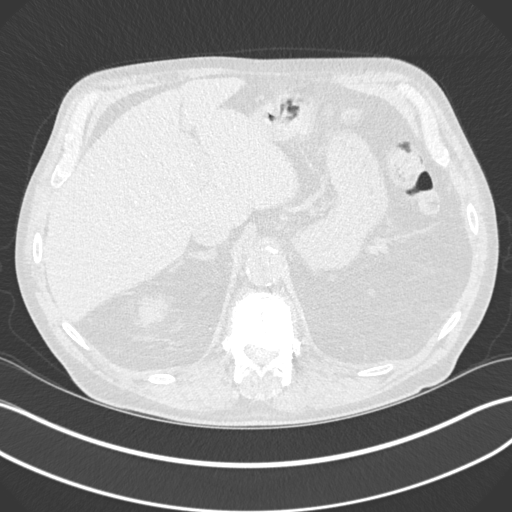
[im 25/166  lung]
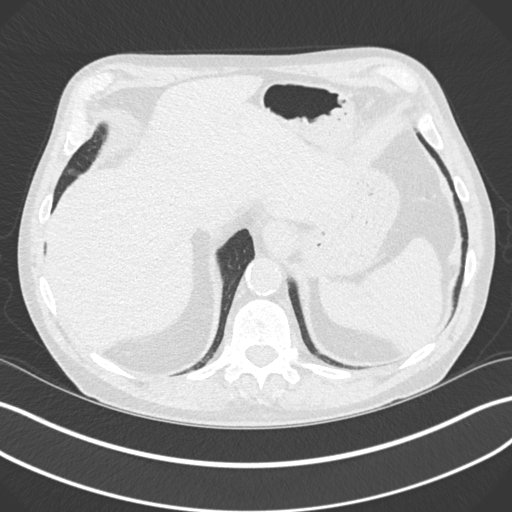
[im 34/166  lung]
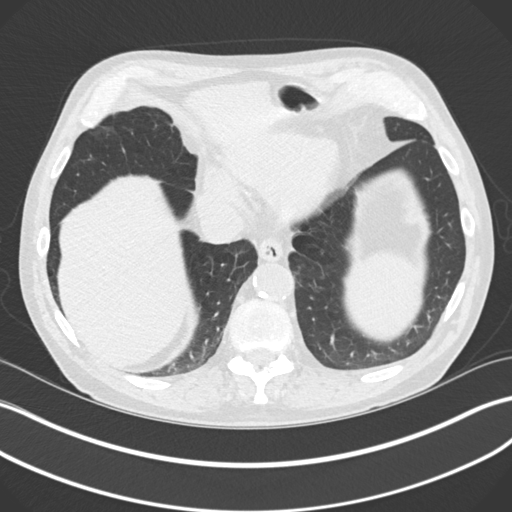
[im 49/166  lung]
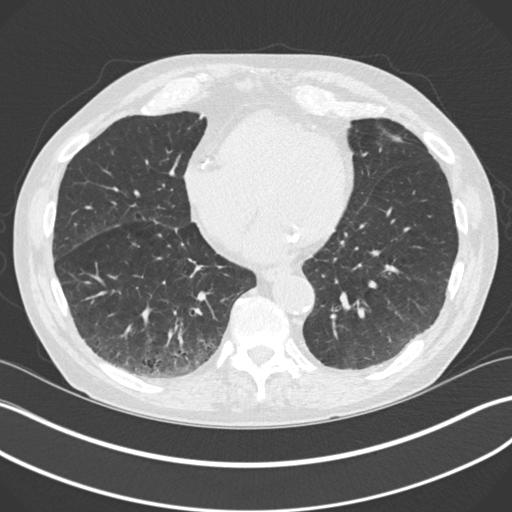
[im 62/166  mediastinal]
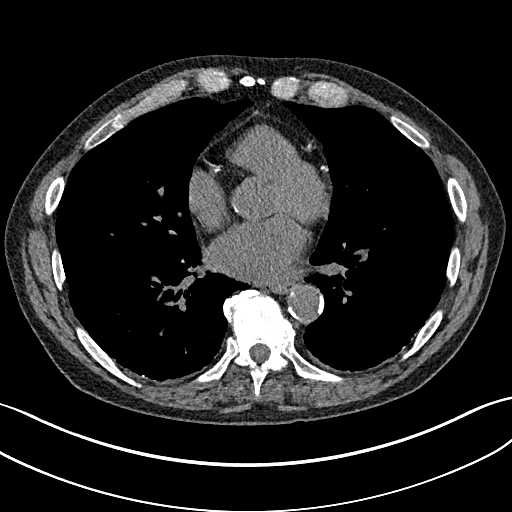
[im 62/166  lung]
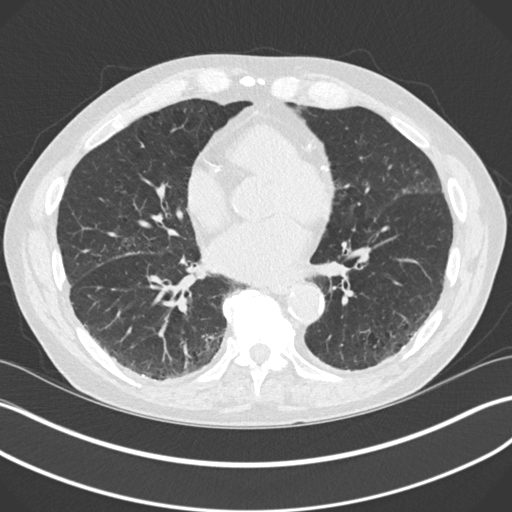
[im 68/166  lung]
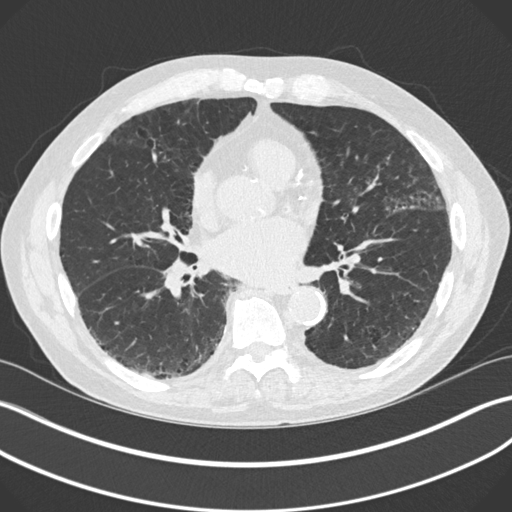
[im 79/166  lung]
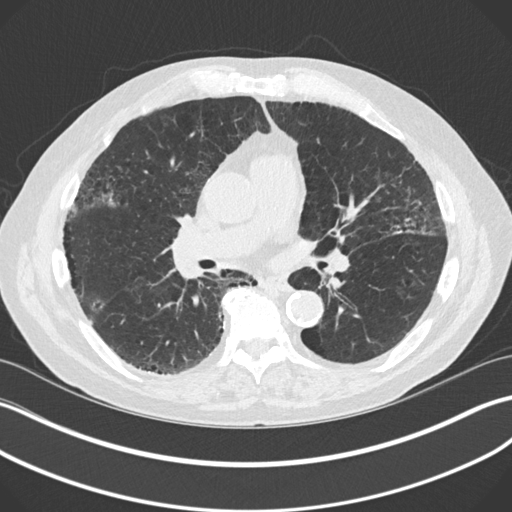
[im 88/166  lung]
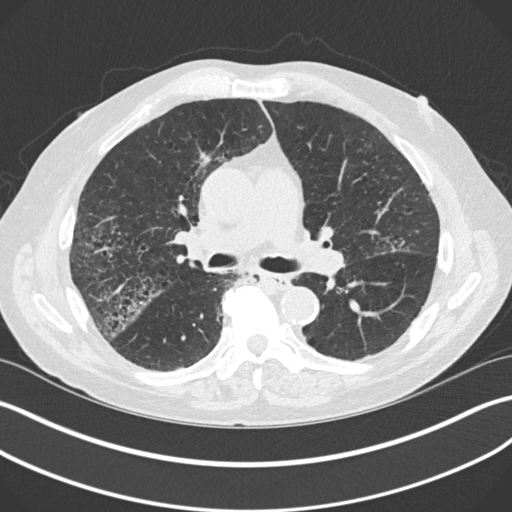
[im 98/166  mediastinal]
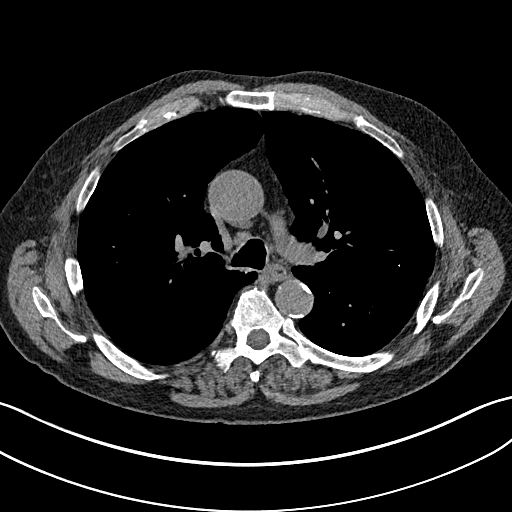
[im 98/166  lung]
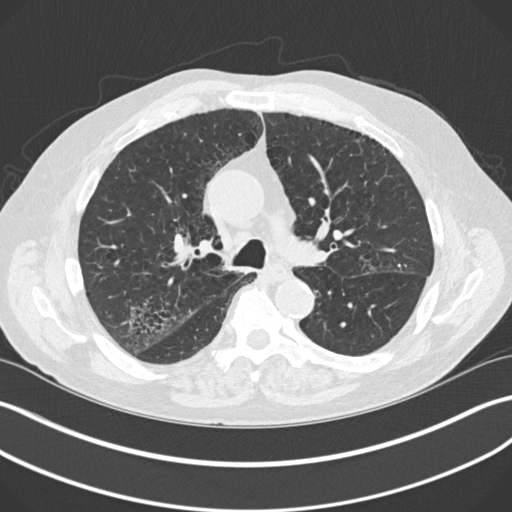
[im 104/166  lung]
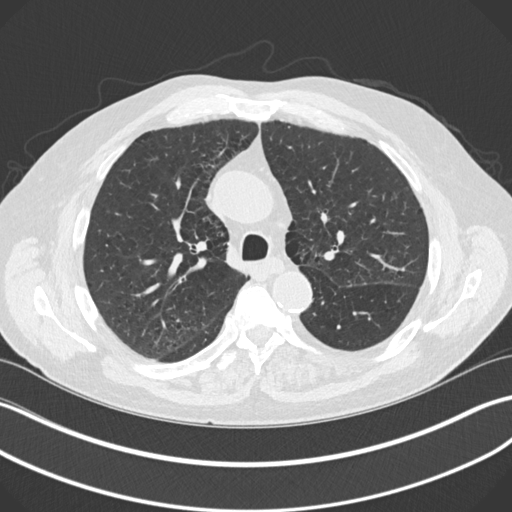
[im 123/166  lung]
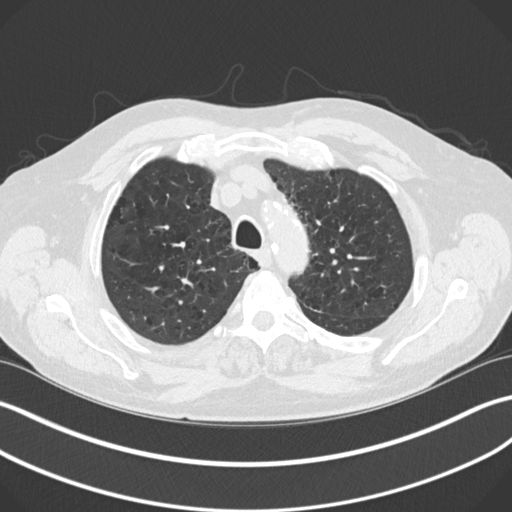
[im 133/166  lung]
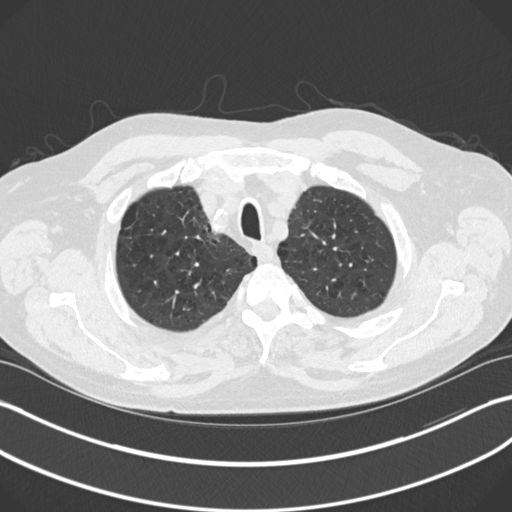
[im 141/166  mediastinal]
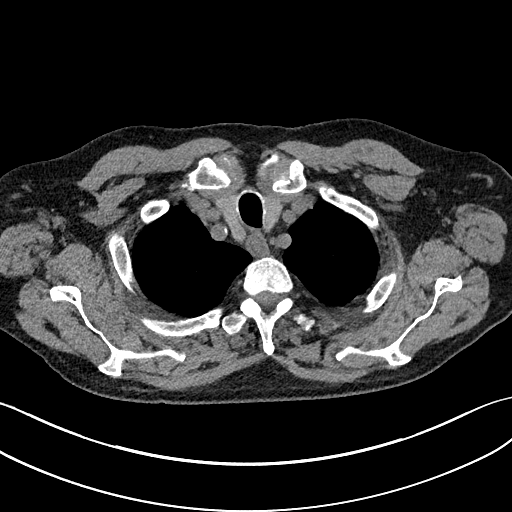
[im 141/166  lung]
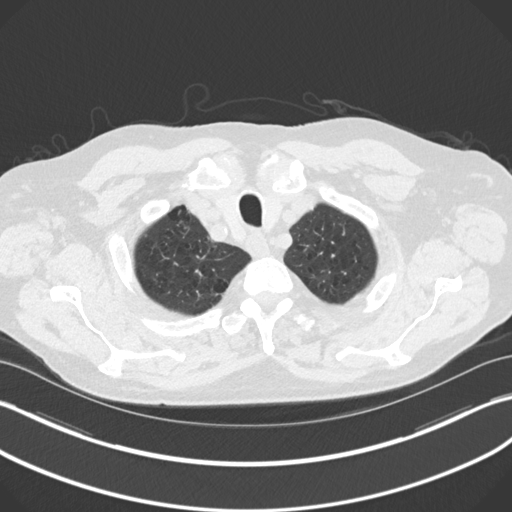
[im 153/166  lung]
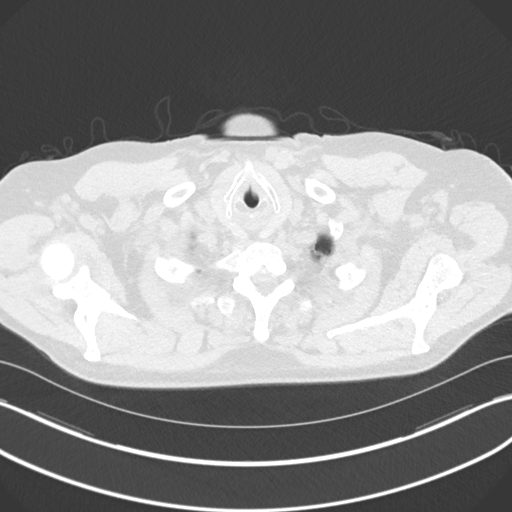

[14 of 34 positions shown; findings below may reference images not displayed]

FINDINGS: Cardiovascular: Atherosclerotic calcifications aorta, proximal great
vessels and coronary arteries. Aneurysmal dilatation of the
ascending thoracic aorta 4.0 cm transverse image 72. No pericardial
effusion.

Mediastinum/Nodes: Esophagus unremarkable. Base of cervical region
normal appearance. Few normal size mediastinal lymph nodes without
thoracic adenopathy. Minimal gynecomastia bilaterally.

Lungs/Pleura: Emphysematous changes with central peribronchial
thickening compatible with COPD. Chronic interstitial changes of the
upper lobes are seen bilaterally progressive since the previous
exam. Dependent atelectasis RIGHT lower lobe. Calcified granuloma
RIGHT upper lobe image 74. No pulmonary infiltrate, pleural effusion
or pneumothorax. No discrete pulmonary mass/nodule.

Upper Abdomen: Questionable cyst at upper pole of LEFT kidney versus
minimally prominent collecting system. Low-attenuation RIGHT adrenal
nodule 17 x 15 mm image 149 consistent with adrenal adenoma, not
definitely seen on previous exam. Remaining visualized upper abdomen
unremarkable.

Musculoskeletal: Prior vertebroplasty at T12. Retropulsion of a
posterior fragment into the spinal canal. Diffuse osseous
demineralization.
IMPRESSION: Emphysematous and bronchitic changes consistent with COPD with
progressive interstitial lung disease changes at the upper lobes
bilaterally.

Small RIGHT adrenal adenoma.

Extensive atherosclerotic calcifications including aorta, proximal
great vessels and coronary arteries with mild aneurysmal dilatation
of the ascending thoracic aorta 4.0 cm greatest size, recommendation
below.

Recommend annual imaging followup by CTA or MRA. This recommendation
follows 3404 ACCF/AHA/AATS/ACR/ASA/SCA/ROBERTO/NIVIRUS/LEION/ISARIEL Guidelines
for the Diagnosis and Management of Patients with Thoracic Aortic
Disease. Circulation. 3404; 121: e266-e369

Aortic Atherosclerosis (POYVT-Z0W.W) and Emphysema (POYVT-HB3.Q).

Aortic aneurysm NOS (POYVT-LLQ.J).

## 2019-03-08 NOTE — Chronic Care Management (AMB) (Addendum)
Chronic Care Management   Note  03/08/2019 Name: Troy Lopez MRN: [id number] DOB: February 26, 1941   Subjective:  Troy Lopez is a 78 y.o. year old male who is a primary care patient of Einar Pheasant, MD. The CCM team was consulted for assistance with chronic disease management and care coordination needs.    Care coordination completed today for medication access concerns.   Review of patient status, including review of consultants reports, laboratory and other test data, was performed as part of comprehensive evaluation and provision of chronic care management services.   Objective:  Lab Results  Component Value Date   CREATININE 1.10 12/15/2018   CREATININE 0.98 11/16/2018   CREATININE 0.98 09/21/2018    Lab Results  Component Value Date   HGBA1C 5.1 11/16/2018       Component Value Date/Time   CHOL 136 11/16/2018 0936   TRIG 48.0 11/16/2018 0936   HDL 55.50 11/16/2018 0936   CHOLHDL 2 11/16/2018 0936   VLDL 9.6 11/16/2018 0936   LDLCALC 71 11/16/2018 0936    Clinical ASCVD: No  The 10-year ASCVD risk score Troy Lopez., et al., 2013) is: 20.2%   Values used to calculate the score:     Age: 50 years     Sex: Male     Is Non-Hispanic African American: No     Diabetic: No     Tobacco smoker: No     Systolic Blood Pressure: 209 mmHg     Is BP treated: Yes     HDL Cholesterol: 55.5 mg/dL     Total Cholesterol: 136 mg/dL    BP Readings from Last 3 Encounters:  12/29/18 (!) 157/83  12/29/18 135/80  12/22/18 136/82    Allergies  Allergen Reactions  . 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 Sig Documenting Provider Last Dose Status Informant   acetaminophen (TYLENOL) 500 MG tablet [id number] Yes Take 500-1,000 mg by mouth 3 (three) times daily as needed for moderate pain or headache. [provider] Taking Active Self           Med Note [name], Aceson Labell E   Mon Feb 22, 2019  9:30 AM) 2 tablets 3 times daily   albuterol (PROVENTIL HFA;VENTOLIN HFA) 108 (90 Base) MCG/ACT inhaler [id number] Yes Inhale 2 puffs into the lungs every 6 (six) hours as needed for wheezing or shortness of breath. Wilhelmina [name], MD Taking Active            Med Note [name] Feb 22, 2019  9:29 AM) 2-3 times daily   amLODipine (NORVASC) 10 MG tablet [id number] Yes Take 1 tablet (10 mg total) by mouth daily. Einar Pheasant, MD Taking Active   azelastine (ASTELIN) 0.1 % nasal spray [id number] Yes Place 1 spray into both nostrils 2 (two) times daily. Use in each nostril as directed Einar Pheasant, MD Taking Active            Med Note [name], Arville Lime   Mon Feb 22, 2019  9:33 AM) Using PRN  budesonide (ENTOCORT EC) 3 MG 24 hr capsule [id number] No Take 9 mg by mouth daily as needed (colitis flare).  [provider] Not Taking Active Self           Med Note [name]   Wed Oct 30, 2016  4:02 PM)    calcium-vitamin D (OSCAL WITH D) 500-200 MG-UNIT tablet [id number] Yes Take 2 tablets by mouth daily. [provider] Taking Active         Discontinued 02/22/19 0933 (Patient Preference)   ELIQUIS 5 MG TABS tablet [id number] Yes Take 5 mg by mouth 2 (two) times daily.  [provider] Taking Active Self           Med Note [name] May 13, 2018  2:47 PM)    FIBER PO [id number] Yes Take 1 capsule by mouth daily.  [provider] Taking Active Self  fluticasone (FLONASE) 50 MCG/ACT nasal spray [id number] Yes Place 2 sprays into both nostrils daily. Einar Pheasant, MD Taking Active Self  fluticasone furoate-vilanterol (BREO ELLIPTA) 100-25 MCG/INH AEPB [id number] Yes INHALE 1 PUFF INTO LUNGS  DAILY. Wilhelmina [name], MD Taking Active   furosemide (LASIX) 20 MG tablet [id number] Yes Take 1 tablet (20 mg total) by mouth daily as needed.  Patient taking differently: Take 40 mg by mouth daily.    Einar Pheasant, MD Taking Active Self  gabapentin (NEURONTIN) 100 MG capsule [id number] Yes Take 4 capsules (400 mg) daily in the morning and midday. Take 8 capsules (800 mg) daily in the evening. Einar Pheasant, MD Taking Active   isosorbide mononitrate (IMDUR) 30 MG 24 hr tablet [id number] Yes Take 1 tablet (30 mg total) by mouth once daily. [provider] Taking Active   metoprolol tartrate (LOPRESSOR) 100 MG tablet [id number] Yes Take 1 tablet (100 mg total) by mouth 2 (two) times daily.  Patient taking differently: Take 50 mg by mouth 2 (two) times daily.    Einar Pheasant, MD Taking Active Self           Med Note ([name] May 13, 2018  3:10 PM) Patient notes he is taking 1/2 tablet BID  mupirocin ointment (BACTROBAN) 2 % [id number] No Apply to affected area bid.  Patient not taking: Reported on 02/22/2019   Einar Pheasant, MD Not Taking Active   pantoprazole (PROTONIX) 40 MG tablet [id number] Yes Take 1 tablet (40 mg total) by mouth 2 (two) times daily. Einar Pheasant, MD Taking Active         Discontinued 02/22/19 0935 (Completed Course)   sertraline (ZOLOFT) 50 MG tablet [id number] Yes Take 1 tablet (50 mg total) by mouth daily. Einar Pheasant, MD Taking Active   vitamin B-12 (CYANOCOBALAMIN) 1000 MCG tablet [id number] Yes Take 1,000 mcg by mouth daily.  [provider] Taking Active Self  Med List Note [name], RN 04/15/18 1448): 04/09/2018 Request to stop Eliquis 4 days prior to procedure faxed to Dr. Saralyn Pilar Manuela Schwartz) 04/15/18 approval received to stop Eliquis 3 days prior to procedure; pt notified to take last dose on 04/25/18 in prep for 04/29/18 procedure.           Assessment:   Goals Addressed            This Visit's Progress      Patient Stated   . "I can't afford my medications" (pt-stated)       Current Barriers:  . Financial Barriers - patient notes difficulty affording brand name medications - Eliquis, Breo, Ventolin . Patient received assistance for all three medications in 2019  Pharmacist Clinical Goal(s):  .  Over the next 90 days, patient will work with PharmD, CPhT, and providers to address needs related to medication access  Interventions: . Received provider portions of applications (Eliquis from Dr. Saralyn Pilar, [employer] and Ventolin from Dr. Alva Garnet) . Received patient portion of application, out of pocket spend, and 2019 income information. Will pass along to [employer], CPhT for submission and follow up with the respective companies.  Marland Kitchen Contacted patient, informed him of the above information.   Patient Self Care Activities:  . Self administers medications as prescribed  Initial goal documentation        Plan: - Will collaborate with Susy Frizzle, CPhT to submit to respective patient assistance programs.   Troy Lopez, PharmD, CPP Clinical Pharmacist [place] [phone]  Reviewed above information.  Agree with assessment and plan.   Dr Nicki Reaper

## 2019-03-08 NOTE — Patient Instructions (Signed)
Visit Information  Goals Addressed            This Visit's Progress     Patient Stated   . "I can't afford my medications" (pt-stated)       Current Barriers:  . Financial Barriers - patient notes difficulty affording brand name medications - Eliquis, Breo, Ventolin . Patient received assistance for all three medications in 2019  Pharmacist Clinical Goal(s):  Marland Kitchen Over the next 90 days, patient will work with PharmD, CPhT, and providers to address needs related to medication access  Interventions: . Received provider portions of applications (Eliquis from Dr. Saralyn Pilar, Memory Dance and Ventolin from Dr. Alva Garnet) . Received patient portion of application, out of pocket spend, and 2019 income information. Will pass along to [employer], CPhT for submission and follow up with the respective companies.  Marland Kitchen Contacted patient, informed him of the above information.   Patient Self Care Activities:  . Self administers medications as prescribed  Initial goal documentation        The patient verbalized understanding of instructions provided today and declined a print copy of patient instruction materials.   Plan: - Will collaborate with Susy Frizzle, CPhT to submit to respective patient assistance programs.   Catie [name], PharmD, [place] Pharmacist [facility] [place] [phone]

## 2019-03-15 ENCOUNTER — Other Ambulatory Visit: Payer: Self-pay

## 2019-03-15 ENCOUNTER — Ambulatory Visit (INDEPENDENT_AMBULATORY_CARE_PROVIDER_SITE_OTHER): Payer: PPO | Admitting: Internal Medicine

## 2019-03-15 ENCOUNTER — Ambulatory Visit
Admission: RE | Admit: 2019-03-15 | Discharge: 2019-03-15 | Disposition: A | Discharge status: Home / Self Care | Payer: PPO | Source: Ambulatory Visit | Attending: Internal Medicine | Admitting: Internal Medicine

## 2019-03-15 DIAGNOSIS — J449 Chronic obstructive pulmonary disease, unspecified: Secondary | ICD-10-CM | POA: Diagnosis not present

## 2019-03-15 DIAGNOSIS — I2510 Atherosclerotic heart disease of native coronary artery without angina pectoris: Secondary | ICD-10-CM

## 2019-03-15 DIAGNOSIS — I10 Essential (primary) hypertension: Secondary | ICD-10-CM

## 2019-03-15 DIAGNOSIS — K52831 Collagenous colitis: Secondary | ICD-10-CM

## 2019-03-15 DIAGNOSIS — K219 Gastro-esophageal reflux disease without esophagitis: Secondary | ICD-10-CM | POA: Diagnosis not present

## 2019-03-15 DIAGNOSIS — D472 Monoclonal gammopathy: Secondary | ICD-10-CM

## 2019-03-15 DIAGNOSIS — I712 Thoracic aortic aneurysm, without rupture, unspecified: Secondary | ICD-10-CM

## 2019-03-15 DIAGNOSIS — M549 Dorsalgia, unspecified: Secondary | ICD-10-CM | POA: Diagnosis not present

## 2019-03-15 DIAGNOSIS — M7989 Other specified soft tissue disorders: Secondary | ICD-10-CM | POA: Insufficient documentation

## 2019-03-15 DIAGNOSIS — I4892 Unspecified atrial flutter: Secondary | ICD-10-CM | POA: Diagnosis not present

## 2019-03-15 DIAGNOSIS — D509 Iron deficiency anemia, unspecified: Secondary | ICD-10-CM | POA: Diagnosis not present

## 2019-03-15 DIAGNOSIS — Z7901 Long term (current) use of anticoagulants: Secondary | ICD-10-CM | POA: Diagnosis not present

## 2019-03-15 DIAGNOSIS — E871 Hypo-osmolality and hyponatremia: Secondary | ICD-10-CM

## 2019-03-15 DIAGNOSIS — R600 Localized edema: Secondary | ICD-10-CM | POA: Diagnosis not present

## 2019-03-15 DIAGNOSIS — G8929 Other chronic pain: Secondary | ICD-10-CM

## 2019-03-15 NOTE — Progress Notes (Signed)
Patient ID: Troy Lopez, male   DOB: April 05, 1941, 78 y.o.   MRN: [id number]   Virtual Visit via telephone Note  This visit type was conducted due to national recommendations for restrictions regarding the COVID-19 pandemic (e.g. social distancing).  This format is felt to be most appropriate for this patient at this time.  All issues noted in this document were discussed and addressed.  No physical exam was performed (except for noted visual exam findings with Video Visits).   I connected with Troy Lopez by telephone and verified that I am speaking with the correct person using two identifiers. Location patient: home Location provider: work  Persons participating in the telephone visit: patient, provider  I discussed the limitations, risks, security and privacy concerns of performing an evaluation and management service by telephone and the availability of in person appointments. The patient expressed understanding and agreed to proceed.   Reason for visit: scheduled follow up  HPI: Has chronic back pain.  Has seen Dr Sharlet Salina.  States was referred to PT.  Postponed secondary to COVID.  Request referral to physical therapy.  Blood pressure doing well.  States blood pressures averaging 114-119/60s.  Has noticed some increased right foot and lower extremity swelling.  Is some better in am.  Has worsened over the last three weeks.  No chest pain.  No increased sob.  No acid reflux.  No abdominal pain.  Bowels moving.  Had f/u with [facility] regarding thoracic aneurysm.  Stable.  Recommended f/u in one year.  Saw cardiology 6/20.  Stable.  Recommended f/u in 4 months.  Bowels doing better.  Received reclast.  Also seeing hematology.  Held on IV iron most recent visit.     ROS: See pertinent positives and negatives per HPI.  Past Medical History:  Diagnosis Date  . Adrenal gland anomaly    enlargement  . Anxiety   . CAD (coronary artery disease)   . Carpal tunnel syndrome   . Chronic  hyponatremia   . Colitis   . Colonic polyp   . COPD (chronic obstructive pulmonary disease) ([place])   . Degenerative disc disease, lumbar   . Depression   . Diverticulosis   . Dyspnea   . GERD (gastroesophageal reflux disease)   . H/O degenerative disc disease   . Hypercholesterolemia   . Hyperkalemia   . Hyperlipidemia   . Hypertension   . Irritable bowel syndrome   . Monoclonal gammopathy   . Monoclonal gammopathy   . Neuropathy   . Personal history of tobacco use, presenting hazards to health 08/17/2015  . Sleep apnea   . Vertebral compression fracture [facility])     Past Surgical History:  Procedure Laterality Date  . BUNIONECTOMY  1989  . CARPAL TUNNEL RELEASE Left 2011   ulnar nerve sub muscular at elbow  . CHOLECYSTECTOMY  09/07  . COLONOSCOPY WITH PROPOFOL N/A 03/05/2017   Procedure: COLONOSCOPY WITH PROPOFOL;  Surgeon: Lucilla Lame, MD;  Location: [facility] ENDOSCOPY;  Service: Endoscopy;  Laterality: N/A;  . ELECTROPHYSIOLOGIC STUDY N/A 11/15/2015   Procedure: CARDIOVERSION;  Surgeon: Isaias Cowman, MD;  Location: [facility] ORS;  Service: Cardiovascular;  Laterality: N/A;  . ESOPHAGOGASTRODUODENOSCOPY (EGD) WITH PROPOFOL N/A 03/05/2017   Procedure: ESOPHAGOGASTRODUODENOSCOPY (EGD) WITH PROPOFOL;  Surgeon: Lucilla Lame, MD;  Location: [facility] ENDOSCOPY;  Service: Endoscopy;  Laterality: N/A;  . ESOPHAGOGASTRODUODENOSCOPY (EGD) WITH PROPOFOL N/A 10/17/2017   Procedure: ESOPHAGOGASTRODUODENOSCOPY (EGD) WITH PROPOFOL;  Surgeon: Lollie Sails, MD;  Location: [facility] ENDOSCOPY;  Service: Endoscopy;  Laterality: N/A;  . EYE SURGERY Bilateral 2010   cataract  . HEMORRHOID SURGERY    . KYPHOPLASTY N/A 11/04/2016   Procedure: KYPHOPLASTY T 12;  Surgeon: Hessie Knows, MD;  Location: [facility] ORS;  Service: Orthopedics;  Laterality: N/A;    Family History  Problem Relation Age of Onset  . Stroke Mother   . Heart disease Father        MI - 73   . Colon cancer Neg Hx   . Prostate cancer Neg  Hx     SOCIAL HX: reviewed.    Current Outpatient Medications:  .  acetaminophen (TYLENOL) 500 MG tablet, Take 500-1,000 mg by mouth 3 (three) times daily as needed for moderate pain or headache., Disp: , Rfl:  .  albuterol (VENTOLIN HFA) 108 (90 Base) MCG/ACT inhaler, Inhale 2 puffs into the lungs every 6 (six) hours as needed for wheezing or shortness of breath., Disp: 18 g, Rfl: 3 .  amLODipine (NORVASC) 10 MG tablet, Take 1 tablet (10 mg total) by mouth daily., Disp: 30 tablet, Rfl: 2 .  azelastine (ASTELIN) 0.1 % nasal spray, Place 1 spray into both nostrils 2 (two) times daily. Use in each nostril as directed, Disp: 30 mL, Rfl: 1 .  budesonide (ENTOCORT EC) 3 MG 24 hr capsule, Take 9 mg by mouth daily as needed (colitis flare). , Disp: , Rfl:  .  calcium-vitamin D (OSCAL WITH D) 500-200 MG-UNIT tablet, Take 2 tablets by mouth daily., Disp: , Rfl:  .  ELIQUIS 5 MG TABS tablet, Take 5 mg by mouth 2 (two) times daily. , Disp: , Rfl:  .  FIBER PO, Take 1 capsule by mouth daily. , Disp: , Rfl:  .  fluticasone (FLONASE) 50 MCG/ACT nasal spray, Place 2 sprays into both nostrils daily., Disp: 16 g, Rfl: 0 .  fluticasone furoate-vilanterol (BREO ELLIPTA) 100-25 MCG/INH AEPB, INHALE 1 PUFF INTO LUNGS DAILY., Disp: 60 each, Rfl: 2 .  furosemide (LASIX) 20 MG tablet, Take 1 tablet (20 mg total) by mouth daily as needed. (Patient taking differently: Take 40 mg by mouth daily. ), Disp: 30 tablet, Rfl: 0 .  gabapentin (NEURONTIN) 100 MG capsule, Take 4 capsules (400 mg) daily in the morning and midday. Take 8 capsules (800 mg) daily in the evening., Disp: 480 capsule, Rfl: 1 .  isosorbide mononitrate (IMDUR) 30 MG 24 hr tablet, Take 1 tablet (30 mg total) by mouth once daily., Disp: , Rfl:  .  metoprolol tartrate (LOPRESSOR) 100 MG tablet, Take 1 tablet (100 mg total) by mouth 2 (two) times daily., Disp: 60 tablet, Rfl: 0 .  mupirocin ointment (BACTROBAN) 2 %, Apply to affected area bid., Disp: 22 g,  Rfl: 0 .  pantoprazole (PROTONIX) 40 MG tablet, Take 1 tablet (40 mg total) by mouth 2 (two) times daily., Disp: 180 tablet, Rfl: 0 .  sertraline (ZOLOFT) 50 MG tablet, Take 1 tablet (50 mg total) by mouth daily., Disp: 90 tablet, Rfl: 1 .  vitamin B-12 (CYANOCOBALAMIN) 1000 MCG tablet, Take 1,000 mcg by mouth daily. , Disp: , Rfl:   EXAM:  VITALS per patient if applicable:  144/31  PSYCH/NEURO: pleasant and cooperative, no obvious depression or anxiety, speech and thought processing grossly intact  ASSESSMENT AND PLAN:  Discussed the following assessment and plan:  Atrial flutter ([place]) S/p cardioversion.  On eliquis.  Followed by cardiology.  Stable.    CAD (coronary artery disease) Followed by cardiology. Stable.    Chronic back pain Has  seen Dr Sharlet Salina.  Had arranged PT.  Cancelled secondary to COVID.  Request referral to PT.   Collagenous colitis Followed by GI.  Stable.    GERD (gastroesophageal reflux disease) Controlled.    Hypertension Blood pressure doing well.  Follow.    Hyponatremia Has been worked up extensively.  Has been evaluated by nephrology.  Sodium stable.    Iron deficiency anemia Followed by hematology.  Receiving IV iron.    MGUS (monoclonal gammopathy of unknown significance) Followed by hematology.   Moderate COPD (chronic obstructive pulmonary disease) (H[facility]) Breathing stable.   Swelling of lower extremity Swelling of right leg as outlined.  Check ultrasound to confirm no DVT.  If negative, compression hose as directed.  Follow.    Thoracic aortic aneurysm ([place]) Has a 4 cm ascending thoracic aneurysm.  Recommended CT chest f/u in one year.  Last evaluated 04/2018.      I discussed the assessment and treatment plan with the patient. The patient was provided an opportunity to ask questions and all were answered. The patient agreed with the plan and demonstrated an understanding of the instructions.   The patient was advised to call back or  seek an in-person evaluation if the symptoms worsen or if the condition fails to improve as anticipated.  I provided 25 minutes of non-face-to-face time during this encounter.   Einar Pheasant, MD

## 2019-03-16 ENCOUNTER — Other Ambulatory Visit: Payer: Self-pay | Admitting: Pharmacy Technician

## 2019-03-16 NOTE — Patient Outreach (Signed)
[place] [facility]) Care Management  03/16/2019  Troy Lopez 02/14/1941 [id number]    ADDENDUM  Unsuccessful outreach call placed to patient in regards to BMS application for Eliquis.  Unfortunately patient did not answer the phone, HIPAA compliant voicemail left.  Was calling patient to inquire if he received the Eliquis.  Will followup with patient in 3-5 business days.  Dushaun Okey P. Siedah Sedor, [place] Management [phone]

## 2019-03-16 NOTE — Patient Outreach (Signed)
[place] [facility]) Care Management  03/16/2019  [place] [birth year] [id number]    Care coordination call placed to BMS in regards to patient's application for Eliquis.  Spoke to [place] who informed patient was APPROVED 03/08/2019-08/19/2019. [place] informed a 90 days supply of medication has already been delivered to the patient and therefore he should have the medication.  Care coordination call placed to [place] in regards to patient's application for Breo and Ventolin HFA.   Spoke to [place] who informed the out of pocket report from the pharmacy was unsigned. He informed he would give the pharmacy a call to get a verbal. He informed once that is completed then the application should be approved. He informed to check back in a few days.  Will outreach patient to inquire if he has received the medication and will outreach [employer] in 3-5 business days.  Denyce Harr P. Wenonah Milo, [place] Management [phone]

## 2019-03-18 ENCOUNTER — Other Ambulatory Visit: Payer: Self-pay | Admitting: Pharmacy Technician

## 2019-03-18 NOTE — Patient Outreach (Signed)
[place] [facility]) Care Management  03/18/2019  [name] August 22, 1940 [id number]    Care coordination call placed to [place] in regards to patient's application for Breo and Ventolin HFA.  Spoke to [place] who informed unless an EOB is submitted as OOP then the pharmacy printout must say patient paid followed by total amount which is circled followed by the date and signature of the pharmacist.  Inquired about my previous call and the representative Saralyn Pilar who informed he would reach out to the pharmacy to get a verbal. She informed he was unable to obtain the necessary information.  She informed the rest of the application looked okay they just needed that one extra piece of information.  Will reach out to embedded [place] for assistance.  Troy Lopez, [place] Management [phone]

## 2019-03-18 NOTE — Patient Outreach (Signed)
[place] [facility]) Care Management  03/18/2019  [place]el[place] Sevin [place] August 25, 1940 [id number]   ADDENDUM  Unsuccessful 2nd call placed to patient in regards to BMS application for Elquis.  Unfortunately patient did not answer the phone, HIPAA compliant voicemail left on both phone numbers on file.  Was calling patient to inquire if he has received Eliquis.  Will attempt final outreach call in 3-7 business days.

## 2019-03-19 ENCOUNTER — Ambulatory Visit: Payer: Self-pay | Admitting: Pharmacist

## 2019-03-19 DIAGNOSIS — I4892 Unspecified atrial flutter: Secondary | ICD-10-CM

## 2019-03-19 DIAGNOSIS — J449 Chronic obstructive pulmonary disease, unspecified: Secondary | ICD-10-CM

## 2019-03-19 NOTE — Chronic Care Management (AMB) (Signed)
  Chronic Care Management   Follow Up Note   03/19/2019 Name: Troy Lopez MRN: [id number] DOB: Aug 04, 1941  Referred by: Einar Pheasant, MD Reason for referral : Chronic Care Management (Medication Management)   Troy Lopez is a 78 y.o. year old male who is a primary care patient of Einar Pheasant, MD. The CCM team was consulted for assistance with chronic disease management and care coordination needs.    Review of patient status, including review of consultants reports, relevant laboratory and other test results, and collaboration with appropriate care team members and the patient's provider was performed as part of comprehensive patient evaluation and provision of chronic care management services.    Goals Addressed            This Visit's Progress     Patient Stated   . "I can't afford my medications" (pt-stated)       Current Barriers:  . Financial Barriers - patient notes difficulty affording brand name medications - Eliquis, Breo, Ventolin . APPROVED for Eliquis through 08/19/2019 . Pending processing for Breo/Ventolin through [place] - the printout showing out of pocket spend from the pharmacy required a pharmacist's signature. Rectified this and faxed back to [place] by Susy Frizzle, CPhT yesterday.   Pharmacist Clinical Goal(s):  Marland Kitchen Over the next 90 days, patient will work with PharmD, CPhT, and providers to address needs related to medication access  Interventions: . Contacted patient, he confirmed that he received the Eliquis in the mail and knows refill process. . Explained the hiccup with the [employer] application for Breo/Ventolin, and let him know we would be following up with the company next week. Encouraged him to be on the lookout for a call from [employer], CPhT.  Patient Self Care Activities:  . Self administers medications as prescribed  Please see past updates related to this goal by clicking on the "Past Updates" button in the selected goal          Plan:  - Will continue to collaborate with patient and Sharee Pimple Simcox, CPhT for follow up.  - Will outreach patient in 4-5 weeks for continued medication management support  Catie [name], PharmD, [place] Pharmacist [place] [phone]

## 2019-03-19 NOTE — Patient Instructions (Signed)
Visit Information  Goals Addressed            This Visit's Progress     Patient Stated   . "I can't afford my medications" (pt-stated)       Current Barriers:  . Financial Barriers - patient notes difficulty affording brand name medications - Eliquis, Breo, Ventolin . APPROVED for Eliquis through 08/19/2019 . Pending processing for Breo/Ventolin through [place] - the printout showing out of pocket spend from the pharmacy required a pharmacist's signature. Rectified this and faxed back to [place] by Susy Frizzle, CPhT yesterday.   Pharmacist Clinical Goal(s):  Marland Kitchen Over the next 90 days, patient will work with PharmD, CPhT, and providers to address needs related to medication access  Interventions: . Contacted patient, he confirmed that he received the Eliquis in the mail and knows refill process. . Explained the hiccup with the [place] application for Breo/Ventolin, and let him know we would be following up with the company next week. Encouraged him to be on the lookout for a call from [employer], CPhT.  Patient Self Care Activities:  . Self administers medications as prescribed  Please see past updates related to this goal by clicking on the "Past Updates" button in the selected goal         The patient verbalized understanding of instructions provided today and declined a print copy of patient instruction materials.   Plan: - Will continue to collaborate with patient and Susy Frizzle, CPhT for follow up.  - Will outreach patient in 4-5 weeks for continued medication management support  Catie [name], PharmD, [place] Pharmacist [place] [phone]

## 2019-03-20 ENCOUNTER — Encounter: Payer: Self-pay | Admitting: Internal Medicine

## 2019-03-20 NOTE — Assessment & Plan Note (Signed)
Has been worked up extensively.  Has been evaluated by nephrology.  Sodium stable.

## 2019-03-20 NOTE — Assessment & Plan Note (Signed)
Followed by GI.  Stable.   

## 2019-03-20 NOTE — Assessment & Plan Note (Signed)
Swelling of right leg as outlined.  Check ultrasound to confirm no DVT.  If negative, compression hose as directed.  Follow.

## 2019-03-20 NOTE — Assessment & Plan Note (Signed)
Controlled.  

## 2019-03-20 NOTE — Assessment & Plan Note (Signed)
Followed by hematology.  Receiving IV iron.

## 2019-03-20 NOTE — Assessment & Plan Note (Signed)
Has a 4 cm ascending thoracic aneurysm.  Recommended CT chest f/u in one year.  Last evaluated 04/2018.

## 2019-03-20 NOTE — Assessment & Plan Note (Signed)
Has seen Dr Sharlet Salina.  Had arranged PT.  Cancelled secondary to COVID.  Request referral to PT.

## 2019-03-20 NOTE — Progress Notes (Signed)
Reviewed above information.  Agree with assessment and plan.    Dr Cabe Lashley 

## 2019-03-20 NOTE — Assessment & Plan Note (Signed)
Followed by hematology 

## 2019-03-20 NOTE — Assessment & Plan Note (Signed)
S/p cardioversion.  On eliquis.  Followed by cardiology.  Stable.

## 2019-03-20 NOTE — Assessment & Plan Note (Signed)
Breathing stable.

## 2019-03-20 NOTE — Assessment & Plan Note (Signed)
Blood pressure doing well.  Follow.  

## 2019-03-20 NOTE — Assessment & Plan Note (Signed)
Followed by cardiology. Stable.   

## 2019-03-22 ENCOUNTER — Telehealth: Payer: Self-pay

## 2019-03-22 ENCOUNTER — Other Ambulatory Visit: Payer: Self-pay | Admitting: Internal Medicine

## 2019-03-22 DIAGNOSIS — M549 Dorsalgia, unspecified: Secondary | ICD-10-CM

## 2019-03-22 DIAGNOSIS — G8929 Other chronic pain: Secondary | ICD-10-CM

## 2019-03-22 NOTE — Telephone Encounter (Signed)
Copied from [place] [id number]. Topic: Referral - Status >> Mar 22, 2019 11:09 AM Yvette Rack wrote: Reason for CRM: Pt stated he would like the referral for physical therapy to go to [facility] on [address] in [place]

## 2019-03-23 NOTE — Telephone Encounter (Signed)
Order placed for referral to PT

## 2019-03-24 ENCOUNTER — Other Ambulatory Visit: Payer: Self-pay | Admitting: Pharmacy Technician

## 2019-03-24 NOTE — Patient Outreach (Signed)
[facility]) Care Management  03/24/2019  Ravine 07-08-1941 [id number]  Care coordination call placed to [place] in regards to patient's application for Breo and Ventolin.  Spoke to [place] who informed patient had been APPROVED 03/22/2019-08/19/2019. Patient to receive 3 inhalers of each kind delivered to his home in the next 7-10 business days.  Bethena Roys also informed patient would need new scripts sent in before next refill is due as the scirpts intially were only sent in for a 90 days supply.  Will followup with patient in 10-14 business days to inquire if he has received the inhalers.  Eulamae Greenstein P. Maleigh Bagot, [facility] [phone]

## 2019-03-29 ENCOUNTER — Encounter: Payer: Self-pay | Admitting: Oncology

## 2019-03-29 DIAGNOSIS — M545 Low back pain: Secondary | ICD-10-CM | POA: Diagnosis not present

## 2019-04-01 DIAGNOSIS — M545 Low back pain: Secondary | ICD-10-CM | POA: Diagnosis not present

## 2019-04-02 ENCOUNTER — Other Ambulatory Visit: Payer: Self-pay | Admitting: *Deleted

## 2019-04-02 DIAGNOSIS — C880 Waldenstrom macroglobulinemia: Secondary | ICD-10-CM

## 2019-04-02 DIAGNOSIS — D509 Iron deficiency anemia, unspecified: Secondary | ICD-10-CM

## 2019-04-02 NOTE — Progress Notes (Signed)
cbc

## 2019-04-02 NOTE — Progress Notes (Signed)
[facility]  [phone] [phone]) [phone]  ID: Troy Lopez OB: 07-Jan-1941  MR#: [id number]  PPI#:[id number]  Patient Care Team: Einar Pheasant, MD as PCP - General (Internal Medicine) Lollie Sails, MD as Consulting Physician (Gastroenterology) Ok Edwards, NP as Nurse Practitioner (Gastroenterology) [name], [facility] as Pharmacist (Pharmacist)  CHIEF COMPLAINT: [name]'s macroglobulinemia, iron deficiency anemia.  INTERVAL HISTORY: Patient returns to clinic today for repeat laboratory work, further evaluation, and consideration of IV Feraheme.  He has noticed worsening weakness and fatigue over the past several months.  He also has chronic shortness of breath and dyspnea on exertion.  He otherwise feels well.  He has no neurologic complaints. He denies any recent fevers or illnesses.  He has a good appetite and denies weight loss.  He denies any chest pain, cough, or hemoptysis.  He denies any nausea, vomiting, constipation, or diarrhea.  He denies any melena or hematochezia.  He has no urinary complaints.  Patient offers no further specific complaints today.  REVIEW OF SYSTEMS:   Review of Systems  Constitutional: Positive for malaise/fatigue. Negative for fever and weight loss.  Respiratory: Positive for shortness of breath. Negative for cough and hemoptysis.   Cardiovascular: Negative.  Negative for chest pain and leg swelling.  Gastrointestinal: Negative.  Negative for abdominal pain, blood in stool, diarrhea and melena.  Genitourinary: Negative.  Negative for hematuria.  Musculoskeletal: Negative.  Negative for back pain.  Skin: Negative.  Negative for rash.  Neurological: Positive for weakness. Negative for sensory change and focal weakness.  Psychiatric/Behavioral: Negative.  The patient is not nervous/anxious.     As per HPI. Otherwise, a complete review of systems is negative.  PAST MEDICAL HISTORY: Past  Medical History:  Diagnosis Date   Adrenal gland anomaly    enlargement   Anxiety    CAD (coronary artery disease)    Carpal tunnel syndrome    Chronic hyponatremia    Colitis    Colonic polyp    COPD (chronic obstructive pulmonary disease) (HCC)    Degenerative disc disease, lumbar    Depression    Diverticulosis    Dyspnea    GERD (gastroesophageal reflux disease)    H/O degenerative disc disease    Hypercholesterolemia    Hyperkalemia    Hyperlipidemia    Hypertension    Irritable bowel syndrome    Monoclonal gammopathy    Monoclonal gammopathy    Neuropathy    Personal history of tobacco use, presenting hazards to health 08/17/2015   Sleep apnea    Vertebral compression fracture ([place])     PAST SURGICAL HISTORY: Past Surgical History:  Procedure Laterality Date   BUNIONECTOMY  1989   CARPAL TUNNEL RELEASE Left 2011   ulnar nerve sub muscular at elbow   CHOLECYSTECTOMY  09/07   COLONOSCOPY WITH PROPOFOL N/A 03/05/2017   Procedure: COLONOSCOPY WITH PROPOFOL;  Surgeon: Lucilla Lame, MD;  Location: [facility] ENDOSCOPY;  Service: Endoscopy;  Laterality: N/A;   ELECTROPHYSIOLOGIC STUDY N/A 11/15/2015   Procedure: CARDIOVERSION;  Surgeon: Isaias Cowman, MD;  Location: [facility] ORS;  Service: Cardiovascular;  Laterality: N/A;   ESOPHAGOGASTRODUODENOSCOPY (EGD) WITH PROPOFOL N/A 03/05/2017   Procedure: ESOPHAGOGASTRODUODENOSCOPY (EGD) WITH PROPOFOL;  Surgeon: Lucilla Lame, MD;  Location: [facility] ENDOSCOPY;  Service: Endoscopy;  Laterality: N/A;   ESOPHAGOGASTRODUODENOSCOPY (EGD) WITH PROPOFOL N/A 10/17/2017   Procedure: ESOPHAGOGASTRODUODENOSCOPY (EGD) WITH PROPOFOL;  Surgeon: Lollie Sails, MD;  Location: [facility] ENDOSCOPY;  Service: Endoscopy;  Laterality: N/A;   EYE  SURGERY Bilateral 2010   cataract   HEMORRHOID SURGERY     KYPHOPLASTY N/A 11/04/2016   Procedure: KYPHOPLASTY T 12;  Surgeon: Hessie Knows, MD;  Location: [facility] ORS;  Service:  Orthopedics;  Laterality: N/A;    FAMILY HISTORY Family History  Problem Relation Age of Onset   Stroke Mother    Heart disease Father        MI 72 32    Colon cancer Neg Hx    Prostate cancer Neg Hx        ADVANCED DIRECTIVES:    HEALTH MAINTENANCE: Social History   Tobacco Use   Smoking status: Former Smoker    Types: Cigarettes    Quit date: 11/01/2015    Years since quitting: 3.4   Smokeless tobacco: Never Used   Tobacco comment: about 2 cigarettes per day, trying to quit  Substance Use Topics   Alcohol use: Yes    Alcohol/week: 42.0 standard drinks    Types: 42 Cans of beer per week    Comment: occas   Drug use: No     Colonoscopy:  PAP:  Bone density:  Lipid panel:  Allergies  Allergen Reactions   Iodinated Diagnostic Agents Other (See Comments)    Contraindication secondary to IGMgammopathy/Waldren's syndrome   Not to be given due to Waldenstrom's syndrome, told it could affect kidney function    Current Outpatient Medications  Medication Sig Dispense Refill   acetaminophen (TYLENOL) 500 MG tablet Take 500-1,000 mg by mouth 3 (three) times daily as needed for moderate pain or headache.     albuterol (VENTOLIN HFA) 108 (90 Base) MCG/ACT inhaler Inhale 2 puffs into the lungs every 6 (six) hours as needed for wheezing or shortness of breath. 18 g 3   amLODipine (NORVASC) 10 MG tablet Take 1 tablet (10 mg total) by mouth daily. 30 tablet 2   azelastine (ASTELIN) 0.1 % nasal spray Place 1 spray into both nostrils 2 (two) times daily. Use in each nostril as directed 30 mL 1   budesonide (ENTOCORT EC) 3 MG 24 hr capsule Take 9 mg by mouth daily as needed (colitis flare).      calcium-vitamin D (OSCAL WITH D) 500-200 MG-UNIT tablet Take 2 tablets by mouth daily.     ELIQUIS 5 MG TABS tablet Take 5 mg by mouth 2 (two) times daily.      FIBER PO Take 1 capsule by mouth daily.      fluticasone (FLONASE) 50 MCG/ACT nasal spray Place 2 sprays into  both nostrils daily. 16 g 0   fluticasone furoate-vilanterol (BREO ELLIPTA) 100-25 MCG/INH AEPB INHALE 1 PUFF INTO LUNGS DAILY. 60 each 2   furosemide (LASIX) 20 MG tablet Take 1 tablet (20 mg total) by mouth daily as needed. (Patient taking differently: Take 40 mg by mouth daily. ) 30 tablet 0   gabapentin (NEURONTIN) 100 MG capsule Take 4 capsules (400 mg) daily in the morning and midday. Take 8 capsules (800 mg) daily in the evening. 480 capsule 1   isosorbide mononitrate (IMDUR) 30 MG 24 hr tablet Take 1 tablet (30 mg total) by mouth once daily.     metoprolol tartrate (LOPRESSOR) 100 MG tablet Take 1 tablet (100 mg total) by mouth 2 (two) times daily. 60 tablet 0   mupirocin ointment (BACTROBAN) 2 % Apply to affected area bid. 22 g 0   pantoprazole (PROTONIX) 40 MG tablet Take 1 tablet (40 mg total) by mouth 2 (two) times daily. 180 tablet 0  sertraline (ZOLOFT) 50 MG tablet Take 1 tablet (50 mg total) by mouth daily. 90 tablet 1   tiZANidine (ZANAFLEX) 2 MG tablet      vitamin B-12 (CYANOCOBALAMIN) 1000 MCG tablet Take 1,000 mcg by mouth daily.      No current facility-administered medications for this visit.    Facility-Administered Medications Ordered in Other Visits  Medication Dose Route Frequency Provider Last Rate Last Dose   0.9 %  sodium chloride infusion   Intravenous Continuous Lloyd Huger, MD 10 mL/hr at 04/08/19 1445      OBJECTIVE: Vitals:   04/08/19 1358  BP: (!) 161/81  Pulse: 60  Resp: 20  Temp: 98.2 F (36.8 C)     Body mass index is 26.04 kg/m.    ECOG FS:0 - Asymptomatic  General: Well-developed, well-nourished, no acute distress. Eyes: Pink conjunctiva, anicteric sclera. HEENT: Normocephalic, moist mucous membranes. Lungs: Clear to auscultation bilaterally. Heart: Regular rate and rhythm. No rubs, murmurs, or gallops. Abdomen: Soft, nontender, nondistended. No organomegaly noted, normoactive bowel sounds. Musculoskeletal: No edema,  cyanosis, or clubbing. Neuro: Alert, answering all questions appropriately. Cranial nerves grossly intact. Skin: No rashes or petechiae noted. Psych: Normal affect.  LAB RESULTS:  Lab Results  Component Value Date   NA 130 (L) 04/06/2019   K 4.5 04/06/2019   CL 97 (L) 04/06/2019   CO2 23 04/06/2019   GLUCOSE 124 (H) 04/06/2019   BUN 13 04/06/2019   CREATININE 1.24 04/06/2019   CALCIUM 9.0 04/06/2019   PROT 8.2 11/16/2018   ALBUMIN 3.6 11/16/2018   AST 16 11/16/2018   ALT 11 11/16/2018   ALKPHOS 71 11/16/2018   BILITOT 0.3 11/16/2018   GFRNONAA 56 (L) 04/06/2019   GFRAA >60 04/06/2019    Lab Results  Component Value Date   WBC 7.2 04/06/2019   NEUTROABS 5.2 04/06/2019   HGB 10.0 (L) 04/06/2019   HCT 30.1 (L) 04/06/2019   MCV 104.2 (H) 04/06/2019   PLT 355 04/06/2019   Lab Results  Component Value Date   IRON 61 04/06/2019   TIBC 992 (H) 04/06/2019   IRONPCTSAT 6 (L) 04/06/2019    Lab Results  Component Value Date   FERRITIN 18 (L) 04/06/2019   Lab Results  Component Value Date   TOTALPROTELP 8.4 12/15/2018   ALBUMINELP 3.9 12/15/2018   A1GS 0.2 12/15/2018   A2GS 0.6 12/15/2018   BETS 1.3 12/15/2018   BETA2SER 0.2 02/14/2016   GAMS 2.4 (H) 12/15/2018   MSPIKE 2.1 (H) 12/15/2018   SPEI Comment 12/15/2018    STUDIES: US Venous Img Lower Bilateral  Result Date: 03/15/2019 CLINICAL DATA:  Bilateral lower extremity edema for the past several months. Patient is currently on anticoagulation. Evaluate for DVT. EXAM: BILATERAL LOWER EXTREMITY VENOUS DOPPLER ULTRASOUND TECHNIQUE: Gray-scale sonography with graded compression, as well as color Doppler and duplex ultrasound were performed to evaluate the lower extremity deep venous systems from the level of the common femoral vein and including the common femoral, femoral, profunda femoral, popliteal and calf veins including the posterior tibial, peroneal and gastrocnemius veins when visible. The superficial great  saphenous vein was also interrogated. Spectral Doppler was utilized to evaluate flow at rest and with distal augmentation maneuvers in the common femoral, femoral and popliteal veins. COMPARISON:  None. FINDINGS: RIGHT LOWER EXTREMITY Common Femoral Vein: No evidence of thrombus. Normal compressibility, respiratory phasicity and response to augmentation. Saphenofemoral Junction: No evidence of thrombus. Normal compressibility and flow on color Doppler imaging. Profunda Femoral Vein:  No evidence of thrombus. Normal compressibility and flow on color Doppler imaging. Femoral Vein: No evidence of thrombus. Normal compressibility, respiratory phasicity and response to augmentation. Popliteal Vein: No evidence of thrombus. Normal compressibility, respiratory phasicity and response to augmentation. Calf Veins: No evidence of thrombus. Normal compressibility and flow on color Doppler imaging. Superficial Great Saphenous Vein: No evidence of thrombus. Normal compressibility. Venous Reflux:  None. Other Findings:  None. LEFT LOWER EXTREMITY Common Femoral Vein: No evidence of thrombus. Normal compressibility, respiratory phasicity and response to augmentation. Saphenofemoral Junction: No evidence of thrombus. Normal compressibility and flow on color Doppler imaging. Profunda Femoral Vein: No evidence of thrombus. Normal compressibility and flow on color Doppler imaging. Femoral Vein: No evidence of thrombus. Normal compressibility, respiratory phasicity and response to augmentation. Popliteal Vein: No evidence of thrombus. Normal compressibility, respiratory phasicity and response to augmentation. Calf Veins: No evidence of thrombus. Normal compressibility and flow on color Doppler imaging. Superficial Great Saphenous Vein: No evidence of thrombus. Normal compressibility. Venous Reflux:  None. Other Findings:  None. IMPRESSION: No evidence of DVT within either lower extremity. Electronically Signed   By: Sandi Mariscal M.D.    On: 03/15/2019 15:50    ASSESSMENT: [name]'s macroglobulinemia, iron deficiency anemia.  PLAN:    1. [name]'s macroglobulinemia:  Bone marrow biopsy results from Jan 08, 2017 reviewed independently confirming underlying [name]'s.  Patient's M spike has been erratic ranging from 1.1 in May 2018 to as high as 2.6 in October 2018.  Today's result is pending.  IgM levels remain persistently elevated greater than 3000, most recently 3309. His kappa/lambda light chain ratio has ranged from 4.16 to 5.71.  He has persistent iron deficiency anemia from a GI source, but no other evidence of endorgan damage. He does not require any treatment at this time. If he had progression of disease as evidenced by B-symptoms or endorgan damage, will consider Rituxan-based therapy.  Continue to monitor every 3 to 6 months. 2. 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  His hemoglobin iron stores have trended down and he is symptomatic.  Proceed with 510 mg IV Feraheme today.  Return to clinic in 1 week for second infusion.  Patient will then return to clinic in 10 weeks with repeat laboratory work, further evaluation, consideration of additional treatment.   3.  Back pain: Chronic and unchanged.  Continue monitoring and treatment per pain clinic.  I spent a total of 30 minutes face-to-face with the patient of which greater than 50% of the visit was spent in counseling and coordination of care as detailed above.   Patient expressed understanding and was in agreement with this plan. He also understands that He can call clinic at any time with any questions, concerns, or complaints.    Lloyd Huger, MD   04/08/2019 3:36 PM

## 2019-04-05 ENCOUNTER — Ambulatory Visit: Payer: PPO | Admitting: Pharmacist

## 2019-04-05 DIAGNOSIS — M545 Low back pain: Secondary | ICD-10-CM | POA: Diagnosis not present

## 2019-04-05 DIAGNOSIS — J449 Chronic obstructive pulmonary disease, unspecified: Secondary | ICD-10-CM

## 2019-04-05 NOTE — Chronic Care Management (AMB) (Signed)
Chronic Care Management   Follow Up Note   04/05/2019 Name: Troy Lopez MRN: [id number] DOB: [birth year]  Referred by: Einar Pheasant, MD Reason for referral : Chronic Care Management (Medication Management)   Troy Lopez is a 78 y.o. year old male who is a primary care patient of Einar Pheasant, MD. The CCM team was consulted for assistance with chronic disease management and care coordination needs.    Contacted patient today for medication management review.   Review of patient status, including review of consultants reports, relevant laboratory and other test results, and collaboration with appropriate care team members and the patient's provider was performed as part of comprehensive patient evaluation and provision of chronic care management services.    Outpatient Encounter Medications as of 04/05/2019  Medication Sig Note  . acetaminophen (TYLENOL) 500 MG tablet Take 500-1,000 mg by mouth 3 (three) times daily as needed for moderate pain or headache. 02/22/2019: 2 tablets 3 times daily   . albuterol (VENTOLIN HFA) 108 (90 Base) MCG/ACT inhaler Inhale 2 puffs into the lungs every 6 (six) hours as needed for wheezing or shortness of breath.   Marland Kitchen amLODipine (NORVASC) 10 MG tablet Take 1 tablet (10 mg total) by mouth daily.   Marland Kitchen azelastine (ASTELIN) 0.1 % nasal spray Place 1 spray into both nostrils 2 (two) times daily. Use in each nostril as directed 02/22/2019: Using PRN  . budesonide (ENTOCORT EC) 3 MG 24 hr capsule Take 9 mg by mouth daily as needed (colitis flare).    . calcium-vitamin D (OSCAL WITH D) 500-200 MG-UNIT tablet Take 2 tablets by mouth daily.   Marland Kitchen ELIQUIS 5 MG TABS tablet Take 5 mg by mouth 2 (two) times daily.    Marland Kitchen FIBER PO Take 1 capsule by mouth daily.    . fluticasone (FLONASE) 50 MCG/ACT nasal spray Place 2 sprays into both nostrils daily.   . fluticasone furoate-vilanterol (BREO ELLIPTA) 100-25 MCG/INH AEPB INHALE 1 PUFF INTO LUNGS DAILY.   . furosemide  (LASIX) 20 MG tablet Take 1 tablet (20 mg total) by mouth daily as needed. (Patient taking differently: Take 40 mg by mouth daily. )   . gabapentin (NEURONTIN) 100 MG capsule Take 4 capsules (400 mg) daily in the morning and midday. Take 8 capsules (800 mg) daily in the evening.   . isosorbide mononitrate (IMDUR) 30 MG 24 hr tablet Take 1 tablet (30 mg total) by mouth once daily.   . metoprolol tartrate (LOPRESSOR) 100 MG tablet Take 1 tablet (100 mg total) by mouth 2 (two) times daily.   . mupirocin ointment (BACTROBAN) 2 % Apply to affected area bid.   . pantoprazole (PROTONIX) 40 MG tablet Take 1 tablet (40 mg total) by mouth 2 (two) times daily.   . sertraline (ZOLOFT) 50 MG tablet Take 1 tablet (50 mg total) by mouth daily.   . vitamin B-12 (CYANOCOBALAMIN) 1000 MCG tablet Take 1,000 mcg by mouth daily.     No facility-administered encounter medications on file as of 04/05/2019.      Goals Addressed            This Visit's Progress     Patient Stated   . "I can't afford my medications" (pt-stated)       Current Barriers:  . Financial Barriers - patient notes difficulty affording brand name medications - Eliquis, Breo, Ventolin . APPROVED for Eliquis through 08/19/2019 . APPROVED for Breo/Ventolin through [place] through 08/19/2019 o Patient contacted today to ask about refills -  he notes that he received a 90 day supply with no refills, and this will not be enough to last through the end of the year  Pharmacist Clinical Goal(s):  Marland Kitchen Over the next 90 days, patient will work with PharmD, CPhT, and providers to address needs related to medication access  Interventions: Marland Kitchen Messaged Darreld Mclean, RN with pulmonologist. Asked that they fax prescriptions for 90 day supplies to [facility] to be on hold for when patient is due for a refill . Patient denies any medication related questions or concerns at this time  Patient Self Care Activities:  . Self administers medications as prescribed   Please see past updates related to this goal by clicking on the "Past Updates" button in the selected goal         Plan:  - Will outreach patient in ~4-5 weeks for continued medication management support  Catie [name], PharmD, [place] Pharmacist [facility] [phone]

## 2019-04-05 NOTE — Patient Instructions (Signed)
Visit Information  Goals Addressed            This Visit's Progress     Patient Stated   . "I can't afford my medications" (pt-stated)       Current Barriers:  . Financial Barriers - patient notes difficulty affording brand name medications - Eliquis, Breo, Ventolin . APPROVED for Eliquis through 08/19/2019 . APPROVED for Breo/Ventolin through [place] through 08/19/2019 o Patient contacted today to ask about refills - he notes that he received a 90 day supply with no refills, and this will not be enough to last through the end of the year  Pharmacist Clinical Goal(s):  Marland Kitchen Over the next 90 days, patient will work with PharmD, CPhT, and providers to address needs related to medication access  Interventions: Marland Kitchen Messaged Darreld Mclean, RN with pulmonologist. Asked that they fax prescriptions for 90 day supplies to [place] to be on hold for when patient is due for a refill . Patient denies any medication related questions or concerns at this time  Patient Self Care Activities:  . Self administers medications as prescribed  Please see past updates related to this goal by clicking on the "Past Updates" button in the selected goal         The patient verbalized understanding of instructions provided today and declined a print copy of patient instruction materials.     Plan:  - Will outreach patient in ~4-5 weeks for continued medication management support  Catie [name], PharmD, [place] Pharmacist [place] [phone]

## 2019-04-05 NOTE — Progress Notes (Signed)
Reviewed.  Agree with plan   Dr Tico Crotteau 

## 2019-04-06 ENCOUNTER — Inpatient Hospital Stay: Payer: PPO | Attending: Oncology

## 2019-04-06 ENCOUNTER — Other Ambulatory Visit: Payer: Self-pay

## 2019-04-06 DIAGNOSIS — D509 Iron deficiency anemia, unspecified: Secondary | ICD-10-CM | POA: Diagnosis not present

## 2019-04-06 DIAGNOSIS — K589 Irritable bowel syndrome without diarrhea: Secondary | ICD-10-CM | POA: Insufficient documentation

## 2019-04-06 DIAGNOSIS — G4730 Sleep apnea, unspecified: Secondary | ICD-10-CM | POA: Diagnosis not present

## 2019-04-06 DIAGNOSIS — C880 Waldenstrom macroglobulinemia: Secondary | ICD-10-CM | POA: Diagnosis not present

## 2019-04-06 DIAGNOSIS — E785 Hyperlipidemia, unspecified: Secondary | ICD-10-CM | POA: Insufficient documentation

## 2019-04-06 DIAGNOSIS — Z7901 Long term (current) use of anticoagulants: Secondary | ICD-10-CM | POA: Insufficient documentation

## 2019-04-06 DIAGNOSIS — Z79899 Other long term (current) drug therapy: Secondary | ICD-10-CM | POA: Insufficient documentation

## 2019-04-06 DIAGNOSIS — E7800 Pure hypercholesterolemia, unspecified: Secondary | ICD-10-CM | POA: Insufficient documentation

## 2019-04-06 DIAGNOSIS — I10 Essential (primary) hypertension: Secondary | ICD-10-CM | POA: Insufficient documentation

## 2019-04-06 DIAGNOSIS — K219 Gastro-esophageal reflux disease without esophagitis: Secondary | ICD-10-CM | POA: Diagnosis not present

## 2019-04-06 DIAGNOSIS — G629 Polyneuropathy, unspecified: Secondary | ICD-10-CM | POA: Diagnosis not present

## 2019-04-06 DIAGNOSIS — D472 Monoclonal gammopathy: Secondary | ICD-10-CM | POA: Diagnosis not present

## 2019-04-06 DIAGNOSIS — I2510 Atherosclerotic heart disease of native coronary artery without angina pectoris: Secondary | ICD-10-CM | POA: Diagnosis not present

## 2019-04-06 DIAGNOSIS — J449 Chronic obstructive pulmonary disease, unspecified: Secondary | ICD-10-CM | POA: Diagnosis not present

## 2019-04-06 DIAGNOSIS — Z86010 Personal history of colonic polyps: Secondary | ICD-10-CM | POA: Insufficient documentation

## 2019-04-06 DIAGNOSIS — F418 Other specified anxiety disorders: Secondary | ICD-10-CM | POA: Insufficient documentation

## 2019-04-06 DIAGNOSIS — Z87891 Personal history of nicotine dependence: Secondary | ICD-10-CM | POA: Diagnosis not present

## 2019-04-06 DIAGNOSIS — G8929 Other chronic pain: Secondary | ICD-10-CM | POA: Insufficient documentation

## 2019-04-06 DIAGNOSIS — R531 Weakness: Secondary | ICD-10-CM | POA: Insufficient documentation

## 2019-04-06 DIAGNOSIS — M5136 Other intervertebral disc degeneration, lumbar region: Secondary | ICD-10-CM | POA: Diagnosis not present

## 2019-04-06 DIAGNOSIS — M549 Dorsalgia, unspecified: Secondary | ICD-10-CM | POA: Insufficient documentation

## 2019-04-06 LAB — BASIC METABOLIC PANEL
Anion gap: 10 (ref 5–15)
BUN: 13 mg/dL (ref 8–23)
CO2: 23 mmol/L (ref 22–32)
Calcium: 9 mg/dL (ref 8.9–10.3)
Chloride: 97 mmol/L — ABNORMAL LOW (ref 98–111)
Creatinine, Ser: 1.24 mg/dL (ref 0.61–1.24)
GFR calc Af Amer: >60 mL/min (ref >60)
GFR calc non Af Amer: 56 mL/min — ABNORMAL LOW (ref >60)
Glucose, Bld: 124 mg/dL — ABNORMAL HIGH (ref 70–99)
Potassium: 4.5 mmol/L (ref 3.5–5.1)
Sodium: 130 mmol/L — ABNORMAL LOW (ref 135–145)

## 2019-04-06 LAB — CBC WITH DIFFERENTIAL/PLATELET
Abs Immature Granulocytes: 0.02 10*3/uL (ref 0.00–0.07)
Basophils Absolute: 0.1 10*3/uL (ref 0.0–0.1)
Basophils Relative: 1 %
Eosinophils Absolute: 0 10*3/uL (ref 0.0–0.5)
Eosinophils Relative: 0 %
HCT: 30.1 % — ABNORMAL LOW (ref 39.0–52.0)
Hemoglobin: 10 g/dL — ABNORMAL LOW (ref 13.0–17.0)
Immature Granulocytes: 0 %
Lymphocytes Relative: 18 %
Lymphs Abs: 1.3 10*3/uL (ref 0.7–4.0)
MCH: 34.6 pg — ABNORMAL HIGH (ref 26.0–34.0)
MCHC: 33.2 g/dL (ref 30.0–36.0)
MCV: 104.2 fL — ABNORMAL HIGH (ref 80.0–100.0)
Monocytes Absolute: 0.6 10*3/uL (ref 0.1–1.0)
Monocytes Relative: 9 %
Neutro Abs: 5.2 10*3/uL (ref 1.7–7.7)
Neutrophils Relative %: 72 %
Platelets: 355 10*3/uL (ref 150–400)
RBC: 2.89 MIL/uL — ABNORMAL LOW (ref 4.22–5.81)
RDW: 13.8 % (ref 11.5–15.5)
WBC: 7.2 10*3/uL (ref 4.0–10.5)
nRBC: 0 % (ref 0.0–0.2)

## 2019-04-06 LAB — IRON AND TIBC
Iron: 61 ug/dL (ref 45–182)
Saturation Ratios: 6 % — ABNORMAL LOW (ref 17.9–39.5)
TIBC: 992 ug/dL — ABNORMAL HIGH (ref 250–450)
UIBC: 931 ug/dL

## 2019-04-06 LAB — FERRITIN: Ferritin: 18 ng/mL — ABNORMAL LOW (ref 24–336)

## 2019-04-07 ENCOUNTER — Other Ambulatory Visit: Payer: Self-pay

## 2019-04-07 DIAGNOSIS — M545 Low back pain: Secondary | ICD-10-CM | POA: Diagnosis not present

## 2019-04-07 LAB — IGG, IGA, IGM
IgA: 35 mg/dL — ABNORMAL LOW (ref 61–437)
IgG (Immunoglobin G), Serum: 449 mg/dL — ABNORMAL LOW (ref 603–1613)
IgM (Immunoglobulin M), Srm: 3309 mg/dL — ABNORMAL HIGH (ref 15–143)

## 2019-04-07 LAB — KAPPA/LAMBDA LIGHT CHAINS
Kappa free light chain: 93 mg/L — ABNORMAL HIGH (ref 3.3–19.4)
Kappa, lambda light chain ratio: 5.5 — ABNORMAL HIGH (ref 0.26–1.65)
Lambda free light chains: 16.9 mg/L (ref 5.7–26.3)

## 2019-04-08 ENCOUNTER — Other Ambulatory Visit: Payer: Self-pay

## 2019-04-08 ENCOUNTER — Other Ambulatory Visit: Payer: Self-pay | Admitting: Pharmacy Technician

## 2019-04-08 ENCOUNTER — Inpatient Hospital Stay: Payer: PPO

## 2019-04-08 ENCOUNTER — Inpatient Hospital Stay (HOSPITAL_BASED_OUTPATIENT_CLINIC_OR_DEPARTMENT_OTHER): Payer: PPO | Admitting: Oncology

## 2019-04-08 VITALS — BP 161/81 | HR 60 | Temp 98.2°F | Resp 20 | Wt 186.7 lb

## 2019-04-08 VITALS — BP 152/76 | HR 60 | Temp 97.5°F | Resp 20

## 2019-04-08 DIAGNOSIS — D509 Iron deficiency anemia, unspecified: Secondary | ICD-10-CM | POA: Diagnosis not present

## 2019-04-08 DIAGNOSIS — C880 Waldenstrom macroglobulinemia: Secondary | ICD-10-CM

## 2019-04-08 MED ORDER — SODIUM CHLORIDE 0.9 % IV SOLN
INTRAVENOUS | Status: DC
  Administered 2019-04-08: 15:00:00 via INTRAVENOUS
  Filled 2019-04-08: qty 250

## 2019-04-08 MED ORDER — SODIUM CHLORIDE 0.9 % IV SOLN
510.0000 mg | Freq: Once | INTRAVENOUS | Status: AC
  Administered 2019-04-08: 510 mg via INTRAVENOUS
  Filled 2019-04-08: qty 17

## 2019-04-08 NOTE — Progress Notes (Signed)
Patient does not offer any problems today.  

## 2019-04-08 NOTE — Patient Outreach (Signed)
[facility]) Care Management  04/08/2019  Lucas [birth year] [id number]    Successful outreach call placed to patient in regards to [place] application for Breo and Ventolin HFA.  Spoke to patient, HIPAA identifiers verified.  Patient informed he had received 3 inhalers of Breo and 3 inhalers of Ventolin HFA. Discussed refill procedure with patient and informed him of where he could locate the number to [place] to phone in his refills when is down to a 2-3 weeks supply. Patient verbalized understanding.  Will route note to [place] that patient assistance has been completed and will remove myself from care team.  Luiz Ochoa. Audreana Hancox, [place] [facility] [phone]

## 2019-04-09 LAB — PROTEIN ELECTROPHORESIS, SERUM
A/G Ratio: 0.8 (ref 0.7–1.7)
Albumin ELP: 3.5 g/dL (ref 2.9–4.4)
Alpha-1-Globulin: 0.2 g/dL (ref 0.0–0.4)
Alpha-2-Globulin: 0.6 g/dL (ref 0.4–1.0)
Beta Globulin: 1.3 g/dL (ref 0.7–1.3)
Gamma Globulin: 2.1 g/dL — ABNORMAL HIGH (ref 0.4–1.8)
Globulin, Total: 4.2 g/dL — ABNORMAL HIGH (ref 2.2–3.9)
M-Spike, %: 1.8 g/dL — ABNORMAL HIGH
Total Protein ELP: 7.7 g/dL (ref 6.0–8.5)

## 2019-04-12 DIAGNOSIS — M545 Low back pain: Secondary | ICD-10-CM | POA: Diagnosis not present

## 2019-04-14 ENCOUNTER — Other Ambulatory Visit: Payer: Self-pay

## 2019-04-14 DIAGNOSIS — K52831 Collagenous colitis: Secondary | ICD-10-CM | POA: Diagnosis not present

## 2019-04-14 DIAGNOSIS — D509 Iron deficiency anemia, unspecified: Secondary | ICD-10-CM | POA: Diagnosis not present

## 2019-04-15 ENCOUNTER — Other Ambulatory Visit: Payer: Self-pay

## 2019-04-15 ENCOUNTER — Inpatient Hospital Stay: Payer: PPO

## 2019-04-15 VITALS — BP 114/63 | HR 62 | Resp 20

## 2019-04-15 DIAGNOSIS — M545 Low back pain: Secondary | ICD-10-CM | POA: Diagnosis not present

## 2019-04-15 DIAGNOSIS — D509 Iron deficiency anemia, unspecified: Secondary | ICD-10-CM

## 2019-04-15 DIAGNOSIS — C880 Waldenstrom macroglobulinemia: Secondary | ICD-10-CM | POA: Diagnosis not present

## 2019-04-15 MED ORDER — SODIUM CHLORIDE 0.9 % IV SOLN
INTRAVENOUS | Status: DC
  Administered 2019-04-15: 14:00:00 via INTRAVENOUS
  Filled 2019-04-15: qty 250

## 2019-04-15 MED ORDER — SODIUM CHLORIDE 0.9 % IV SOLN
510.0000 mg | Freq: Once | INTRAVENOUS | Status: AC
  Administered 2019-04-15: 510 mg via INTRAVENOUS
  Filled 2019-04-15: qty 17

## 2019-04-19 ENCOUNTER — Other Ambulatory Visit: Payer: PPO

## 2019-04-19 ENCOUNTER — Other Ambulatory Visit: Payer: Self-pay | Admitting: Internal Medicine

## 2019-04-21 ENCOUNTER — Ambulatory Visit: Payer: PPO | Admitting: Oncology

## 2019-04-21 ENCOUNTER — Ambulatory Visit: Payer: PPO

## 2019-05-10 ENCOUNTER — Ambulatory Visit: Payer: PPO | Admitting: Pharmacist

## 2019-05-10 DIAGNOSIS — J449 Chronic obstructive pulmonary disease, unspecified: Secondary | ICD-10-CM

## 2019-05-10 DIAGNOSIS — G8929 Other chronic pain: Secondary | ICD-10-CM

## 2019-05-10 NOTE — Patient Instructions (Signed)
Visit Information  Goals Addressed            This Visit's Progress     Patient Stated   . "I can't afford my medications" (pt-stated)       Current Barriers:  . Financial Barriers - RESOLVED . APPROVED for Eliquis through 08/19/2019 . APPROVED for Breo/Ventolin through [place] through 08/19/2019  Pharmacist Clinical Goal(s):  Marland Kitchen Over the next 90 days, patient will work with PharmD, CPhT, and providers to address needs related to medication access  Interventions: . Patient will need refills on Breo/Ventolin next month. Will remind myself to collaborate w/ Dr. Nicki Reaper at his appointment to print refills for Breo/Ventolin to fax to [place] so patient has adequate refills for the rest of the year   Patient Self Care Activities:  . Self administers medications as prescribed  Please see past updates related to this goal by clicking on the "Past Updates" button in the selected goal        Other   . "My feet are uncomfortable/swollen"       Current Barriers:  . Patient reports of "my legs and feet hurt more", complicated by hx neuropathy, HTN, Afib, CAD . Notes that over the past few weeks, he has had more trouble with discomfort and swelling in his legs and feet bilaterally (though R foot > L foot; L leg > R leg per patient report). Endorses some swelling, denies redness/erythema. o Continues to take furosemide 40 mg daily as prescribed  o Also taking gabapentin - prescribed 400 mg QAM, 400 mg Qnoon, 800 mg QPM; though patient sometimes taking 400-500 mg at one time or not at all, using PRN; also taking tizanidine 2 mg, generally 1-3 times daily for compressed disc o Wears compression hose as needed; notes he can't put on himself, so his wife has to help him.   Pharmacist Clinical Goal(s):  Marland Kitchen Over the next 90 days, patient will work with PharmD and provider towards optimized medication management  Interventions: . Comprehensive medication review performed; medication list updated in  electronic medical record . Discussed symptoms with patient. Encouraged him to discuss w/ Dr. Nicki Reaper at upcoming appointment next month. Encouraged that if pain gets worse, contact office to be seen sooner . 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 Patient notes that he doesn't get sleepy with gabapentin alone, but tizanidine can occasionally make him sleepy. Discussed increased incidence of CNS sedation as he ages or as renal function declines; he notes he will be cognizant to whether gabapentin increases sedation moving forward   Patient Self Care Activities:  . Patient will take medications as prescribed  Initial goal documentation        The patient verbalized understanding of instructions provided today and declined a print copy of patient instruction materials.    Plan:  - Will collaborate with Dr. Nicki Reaper on inhaler refills at his appointment next month - Will outreach patient in 6-8 weeks for continued medication management support.   Catie [name], PharmD, [place] Pharmacist [facility] [employer] [phone]

## 2019-05-10 NOTE — Chronic Care Management (AMB) (Signed)
Chronic Care Management   Follow Up Note   05/10/2019 Name: Troy Lopez MRN: [id number] DOB: 1941-02-24  Referred by: Einar Pheasant, MD Reason for referral : Chronic Care Management (Medication Management)   Troy Lopez is a 78 y.o. year old male who is a primary care patient of Einar Pheasant, MD. The CCM team was consulted for assistance with chronic disease management and care coordination needs.    Contacted patient for medication management review today.   Review of patient status, including review of consultants reports, relevant laboratory and other test results, and collaboration with appropriate care team members and the patient's provider was performed as part of comprehensive patient evaluation and provision of chronic care management services.    SDOH (Social Determinants of Health) screening performed today: Financial Strain . See Care Plan for related entries.   Advanced Directives Status: N See Care Plan and [place] application for related entries.  Outpatient Encounter Medications as of 05/10/2019  Medication Sig Note  . acetaminophen (TYLENOL) 500 MG tablet Take 500-1,000 mg by mouth 3 (three) times daily as needed for moderate pain or headache. 02/22/2019: 2 tablets 3 times daily   . albuterol (VENTOLIN HFA) 108 (90 Base) MCG/ACT inhaler Inhale 2 puffs into the lungs every 6 (six) hours as needed for wheezing or shortness of breath. 05/10/2019: Up to 2-3 times/day; some day none; at least 3 times/week   . amLODipine (NORVASC) 10 MG tablet Take 1 tablet (10 mg total) by mouth daily. 05/10/2019: QAM   . azelastine (ASTELIN) 0.1 % nasal spray Place 1 spray into both nostrils 2 (two) times daily. Use in each nostril as directed 02/22/2019: Using PRN  . calcium-vitamin D (OSCAL WITH D) 500-200 MG-UNIT tablet Take 2 tablets by mouth daily.   Marland Kitchen ELIQUIS 5 MG TABS tablet Take 5 mg by mouth 2 (two) times daily.    Marland Kitchen FIBER PO Take 1 capsule by mouth daily.    . fluticasone  furoate-vilanterol (BREO ELLIPTA) 100-25 MCG/INH AEPB INHALE 1 PUFF INTO LUNGS DAILY.   . furosemide (LASIX) 20 MG tablet Take 1 tablet (20 mg total) by mouth daily as needed. (Patient taking differently: Take 40 mg by mouth daily. )   . gabapentin (NEURONTIN) 100 MG capsule Take 4 capsules (400 mg) daily in the morning and midday. Take 8 capsules (800 mg) daily in the evening. 05/10/2019: Using PRN   . isosorbide mononitrate (IMDUR) 30 MG 24 hr tablet Take 1 tablet (30 mg total) by mouth once daily.   . metoprolol tartrate (LOPRESSOR) 100 MG tablet Take 1 tablet (100 mg total) by mouth 2 (two) times daily. 05/10/2019: 50 mg BID  . pantoprazole (PROTONIX) 40 MG tablet Take 1 tablet (40 mg total) by mouth 2 (two) times daily.   . sertraline (ZOLOFT) 50 MG tablet Take 1 tablet (50 mg total) by mouth daily.   Marland Kitchen tiZANidine (ZANAFLEX) 2 MG tablet  05/10/2019: Taking 1-3 times daily   . vitamin B-12 (CYANOCOBALAMIN) 1000 MCG tablet Take 1,000 mcg by mouth daily.    . budesonide (ENTOCORT EC) 3 MG 24 hr capsule Take 9 mg by mouth daily as needed (colitis flare).    . fluticasone (FLONASE) 50 MCG/ACT nasal spray Place 2 sprays into both nostrils daily. (Patient not taking: Reported on 05/10/2019)   . mupirocin ointment (BACTROBAN) 2 % Apply to affected area bid. (Patient not taking: Reported on 05/10/2019)    No facility-administered encounter medications on file as of 05/10/2019.  Goals Addressed            This Visit's Progress     Patient Stated   . "I can't afford my medications" (pt-stated)       Current Barriers:  . Financial Barriers - RESOLVED . APPROVED for Eliquis through 08/19/2019 . APPROVED for Breo/Ventolin through [place] through 08/19/2019  Pharmacist Clinical Goal(s):  Marland Kitchen Over the next 90 days, patient will work with PharmD, CPhT, and providers to address needs related to medication access  Interventions: . Patient will need refills on Breo/Ventolin next month. Will remind myself  to collaborate w/ Dr. Nicki Reaper at his appointment to print refills for Breo/Ventolin to fax to [place] so patient has adequate refills for the rest of the year   Patient Self Care Activities:  . Self administers medications as prescribed  Please see past updates related to this goal by clicking on the "Past Updates" button in the selected goal        Other   . "My feet are uncomfortable/swollen"       Current Barriers:  . Patient reports of "my legs and feet hurt more", complicated by hx neuropathy, HTN, Afib, CAD . Notes that over the past few weeks, he has had more trouble with discomfort and swelling in his legs and feet bilaterally (though R foot > L foot; L leg > R leg per patient report). Endorses some swelling, denies redness/erythema. o Continues to take furosemide 40 mg daily as prescribed  o Also taking gabapentin - prescribed 400 mg QAM, 400 mg Qnoon, 800 mg QPM; though patient sometimes taking 400-500 mg at one time or not at all, using PRN; also taking tizanidine 2 mg, generally 1-3 times daily for compressed disc o Wears compression hose as needed; notes he can't put on himself, so his wife has to help him.   Pharmacist Clinical Goal(s):  Marland Kitchen Over the next 90 days, patient will work with PharmD and provider towards optimized medication management  Interventions: . Comprehensive medication review performed; medication list updated in electronic medical record . Discussed symptoms with patient. Encouraged him to discuss w/ Dr. Nicki Reaper at upcoming appointment next month. Encouraged that if pain gets worse, contact office to be seen sooner . 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 Patient notes that he doesn't get sleepy with gabapentin alone, but tizanidine can occasionally make him sleepy. Discussed increased incidence of CNS sedation as he ages or as renal function declines; he notes he will be cognizant to whether gabapentin increases sedation moving forward    Patient Self Care Activities:  . Patient will take medications as prescribed  Initial goal documentation         Plan:  - Will collaborate with Dr. Nicki Reaper on inhaler refills at his appointment next month - Will outreach patient in 6-8 weeks for continued medication management support.   Catie [name], PharmD, [place] Pharmacist [facility] [phone]

## 2019-05-11 NOTE — Progress Notes (Signed)
Reviewed.  Will follow up with pt regarding leg swelling.  Agree with plan.    Dr Nicki Reaper

## 2019-05-25 ENCOUNTER — Encounter: Payer: Self-pay | Admitting: Internal Medicine

## 2019-05-25 ENCOUNTER — Other Ambulatory Visit: Payer: Self-pay | Admitting: Internal Medicine

## 2019-05-25 DIAGNOSIS — Z20822 Contact with and (suspected) exposure to covid-19: Secondary | ICD-10-CM

## 2019-05-26 ENCOUNTER — Other Ambulatory Visit: Payer: Self-pay

## 2019-05-26 DIAGNOSIS — Z20828 Contact with and (suspected) exposure to other viral communicable diseases: Secondary | ICD-10-CM | POA: Diagnosis not present

## 2019-05-26 DIAGNOSIS — Z20822 Contact with and (suspected) exposure to covid-19: Secondary | ICD-10-CM

## 2019-05-26 NOTE — Telephone Encounter (Signed)
Spoke with pt. Advised that he should still be tested for COVID even though he does not have a fever. Increased SOB started over the weekend. Pt declined going to the ED. Gave him info for drive thru testing. Scheduled for virtual tomorrow. Advised if his breathing got worse or developed any other symptoms he does not need to wait, needs evaluation today. Pt agreed. He is going to be tested within the next couple of hours.

## 2019-05-26 NOTE — Telephone Encounter (Signed)
Agree with need for covid testing and need for evaluation.  With his history and with increased sob, do agree with evaluation today.  If refusing ER - can also go to UC.  Can do virtual tomorrow, but just want to make sure nothing more acute going on.

## 2019-05-26 NOTE — Telephone Encounter (Signed)
Advised of below. Still has doxy for tomorrow.

## 2019-05-27 ENCOUNTER — Other Ambulatory Visit: Payer: Self-pay

## 2019-05-27 ENCOUNTER — Ambulatory Visit (INDEPENDENT_AMBULATORY_CARE_PROVIDER_SITE_OTHER): Payer: PPO | Admitting: Internal Medicine

## 2019-05-27 DIAGNOSIS — J441 Chronic obstructive pulmonary disease with (acute) exacerbation: Secondary | ICD-10-CM

## 2019-05-27 DIAGNOSIS — I4892 Unspecified atrial flutter: Secondary | ICD-10-CM | POA: Diagnosis not present

## 2019-05-27 DIAGNOSIS — K219 Gastro-esophageal reflux disease without esophagitis: Secondary | ICD-10-CM

## 2019-05-27 DIAGNOSIS — I2510 Atherosclerotic heart disease of native coronary artery without angina pectoris: Secondary | ICD-10-CM

## 2019-05-27 MED ORDER — PREDNISONE 10 MG PO TABS: ORAL_TABLET | ORAL | 0 refills | Status: DC

## 2019-05-27 NOTE — Progress Notes (Signed)
Patient ID: Troy Lopez, male   DOB: [birth year], 78 y.o.   MRN: [id number]   Virtual Visit via telephone Note  This visit type was conducted due to national recommendations for restrictions regarding the COVID-19 pandemic (e.g. social distancing).  This format is felt to be most appropriate for this patient at this time.  All issues noted in this document were discussed and addressed.  No physical exam was performed (except for noted visual exam findings with Video Visits).   I connected with Noralee Chars by telephone and verified that I am speaking with the correct person using two identifiers. Location patient: home Location provider: work Persons participating in the telephone visit: patient, provider  I discussed the limitations, risks, security and privacy concerns of performing an evaluation and management service by telephone and the availability of in person appointments.  The patient expressed understanding and agreed to proceed.   Reason for visit: work in appt  HPI: Work in appt with concerns over [place].  States has noticed recently that when he walks upstairs, he has to stop and catch his breath.  No fever.  Some am nasal congestion.  Clear mucus. Once up and moving, seems to clear.  No sore throat.  No drainage.  No chest congestion.  Some cough.  Occasional wheezing.  No acid reflux.  States he had chest pain - one day - over right breast.  No pain with deep breathing.  No pain now.  No fever. No chills.  Uses breo scheduled and ventolin prn.    ROS: See pertinent positives and negatives per HPI.  Past Medical History:  Diagnosis Date  . Adrenal gland anomaly    enlargement  . Anxiety   . CAD (coronary artery disease)   . Carpal tunnel syndrome   . Chronic hyponatremia   . Colitis   . Colonic polyp   . COPD (chronic obstructive pulmonary disease) ([place])   . Degenerative disc disease, lumbar   . Depression   . Diverticulosis   . Dyspnea   . GERD (gastroesophageal  reflux disease)   . H/O degenerative disc disease   . Hypercholesterolemia   . Hyperkalemia   . Hyperlipidemia   . Hypertension   . Irritable bowel syndrome   . Monoclonal gammopathy   . Monoclonal gammopathy   . Neuropathy   . Personal history of tobacco use, presenting hazards to health 08/17/2015  . Sleep apnea   . Vertebral compression fracture [facility])     Past Surgical History:  Procedure Laterality Date  . BUNIONECTOMY  1989  . CARPAL TUNNEL RELEASE Left 2011   ulnar nerve sub muscular at elbow  . CHOLECYSTECTOMY  09/07  . COLONOSCOPY WITH PROPOFOL N/A 03/05/2017   Procedure: COLONOSCOPY WITH PROPOFOL;  Surgeon: Lucilla Lame, MD;  Location: [facility] ENDOSCOPY;  Service: Endoscopy;  Laterality: N/A;  . ELECTROPHYSIOLOGIC STUDY N/A 11/15/2015   Procedure: CARDIOVERSION;  Surgeon: Isaias Cowman, MD;  Location: [facility] ORS;  Service: Cardiovascular;  Laterality: N/A;  . ESOPHAGOGASTRODUODENOSCOPY (EGD) WITH PROPOFOL N/A 03/05/2017   Procedure: ESOPHAGOGASTRODUODENOSCOPY (EGD) WITH PROPOFOL;  Surgeon: Lucilla Lame, MD;  Location: [facility] ENDOSCOPY;  Service: Endoscopy;  Laterality: N/A;  . ESOPHAGOGASTRODUODENOSCOPY (EGD) WITH PROPOFOL N/A 10/17/2017   Procedure: ESOPHAGOGASTRODUODENOSCOPY (EGD) WITH PROPOFOL;  Surgeon: Lollie Sails, MD;  Location[facility] ENDOSCOPY;  Service: Endoscopy;  Laterality: N/A;  . EYE SURGERY Bilateral 2010   cataract  . HEMORRHOID SURGERY    . KYPHOPLASTY N/A 11/04/2016   Procedure: KYPHOPLASTY T 12;  Surgeon: Hessie Knows, MD;  Location: [facility] ORS;  Service: Orthopedics;  Laterality: N/A;    Family History  Problem Relation Age of Onset  . Stroke Mother   . Heart disease Father        MI - 32   . Colon cancer Neg Hx   . Prostate cancer Neg Hx     SOCIAL HX: reviewed.    Current Outpatient Medications:  .  acetaminophen (TYLENOL) 500 MG tablet, Take 500-1,000 mg by mouth 3 (three) times daily as needed for moderate pain or headache., Disp: , Rfl:   .  albuterol (VENTOLIN HFA) 108 (90 Base) MCG/ACT inhaler, Inhale 2 puffs into the lungs every 6 (six) hours as needed for wheezing or shortness of breath., Disp: 18 g, Rfl: 3 .  amLODipine (NORVASC) 10 MG tablet, Take 1 tablet (10 mg total) by mouth daily., Disp: 30 tablet, Rfl: 0 .  azelastine (ASTELIN) 0.1 % nasal spray, Place 1 spray into both nostrils 2 (two) times daily. Use in each nostril as directed, Disp: 30 mL, Rfl: 1 .  budesonide (ENTOCORT EC) 3 MG 24 hr capsule, Take 9 mg by mouth daily as needed (colitis flare). , Disp: , Rfl:  .  calcium-vitamin D (OSCAL WITH D) 500-200 MG-UNIT tablet, Take 2 tablets by mouth daily., Disp: , Rfl:  .  ELIQUIS 5 MG TABS tablet, Take 5 mg by mouth 2 (two) times daily. , Disp: , Rfl:  .  FIBER PO, Take 1 capsule by mouth daily. , Disp: , Rfl:  .  fluticasone (FLONASE) 50 MCG/ACT nasal spray, Place 2 sprays into both nostrils daily. (Patient not taking: Reported on 05/10/2019), Disp: 16 g, Rfl: 0 .  fluticasone furoate-vilanterol (BREO ELLIPTA) 100-25 MCG/INH AEPB, INHALE 1 PUFF INTO LUNGS DAILY., Disp: 60 each, Rfl: 2 .  furosemide (LASIX) 20 MG tablet, Take 1 tablet (20 mg total) by mouth daily as needed. (Patient taking differently: Take 40 mg by mouth daily. ), Disp: 30 tablet, Rfl: 0 .  gabapentin (NEURONTIN) 100 MG capsule, Take 4 capsules (400 mg) daily in the morning and midday. Take 8 capsules (800 mg) daily in the evening., Disp: 480 capsule, Rfl: 0 .  isosorbide mononitrate (IMDUR) 30 MG 24 hr tablet, Take 1 tablet (30 mg total) by mouth once daily., Disp: , Rfl:  .  metoprolol tartrate (LOPRESSOR) 100 MG tablet, Take 1 tablet (100 mg total) by mouth 2 (two) times daily., Disp: 60 tablet, Rfl: 0 .  mupirocin ointment (BACTROBAN) 2 %, Apply to affected area bid. (Patient not taking: Reported on 05/10/2019), Disp: 22 g, Rfl: 0 .  pantoprazole (PROTONIX) 40 MG tablet, Take 1 tablet (40 mg total) by mouth 2 (two) times daily., Disp: 180 tablet, Rfl: 0  .  predniSONE (DELTASONE) 10 MG tablet, Take 4 tablets x 1 day and then decrease by 1/2 tablet per day until down to zero mg., Disp: 18 tablet, Rfl: 0 .  sertraline (ZOLOFT) 50 MG tablet, Take 1 tablet (50 mg total) by mouth daily., Disp: 90 tablet, Rfl: 1 .  tiZANidine (ZANAFLEX) 2 MG tablet, , Disp: , Rfl:  .  vitamin B-12 (CYANOCOBALAMIN) 1000 MCG tablet, Take 1,000 mcg by mouth daily. , Disp: , Rfl:   EXAM:  GENERAL: alert.  Sounds to be in no acute distress.  Answering questions appropriately.   PSYCH/NEURO: pleasant and cooperative, no obvious depression or anxiety, speech and thought processing grossly intact  ASSESSMENT AND PLAN:  Discussed the following assessment and  plan:  Atrial flutter ([place]) S/p cardioversion.  On eliquis.  Has been stable.  Followed by cardiology.   CAD (coronary artery disease) Followed by cardiology.  Having sob with exertion as outlined.  Treat for respiratory flare with prednisone as outlined.  Discussed possible cardiac origin given no significant chest congestion. Discussed limitations given telephone visit.  Discussed f/u with cardiology.  Pt in agreement.  Knows if any change or worsening symptoms, he is to be evaluated.    COPD exacerbation ([place]) SOB with exertion as outlined.  Some cough. No increased chest congestion.  Tested for covid yesterday.  Results pending at visit.  Treat with prednisone taper as directed.  Hold abx.  Saline nasal spray and steroid nasal spray as directed.  Follow.    GERD (gastroesophageal reflux disease) Controlled.     I discussed the assessment and treatment plan with the patient. The patient was provided an opportunity to ask questions and all were answered. The patient agreed with the plan and demonstrated an understanding of the instructions.   The patient was advised to call back or seek an in-person evaluation if the symptoms worsen or if the condition fails to improve as anticipated.  I provided 27 minutes  of non-face-to-face time during this encounter.   Einar Pheasant, MD

## 2019-05-28 ENCOUNTER — Encounter: Payer: Self-pay | Admitting: Internal Medicine

## 2019-05-28 LAB — NOVEL CORONAVIRUS, NAA: SARS-CoV-2, NAA: NOT DETECTED

## 2019-05-30 ENCOUNTER — Encounter: Payer: Self-pay | Admitting: Internal Medicine

## 2019-05-30 NOTE — Assessment & Plan Note (Signed)
S/p cardioversion.  On eliquis.  Has been stable.  Followed by cardiology.

## 2019-05-30 NOTE — Assessment & Plan Note (Signed)
Followed by cardiology.  Having sob with exertion as outlined.  Treat for respiratory flare with prednisone as outlined.  Discussed possible cardiac origin given no significant chest congestion. Discussed limitations given telephone visit.  Discussed f/u with cardiology.  Pt in agreement.  Knows if any change or worsening symptoms, he is to be evaluated.

## 2019-05-30 NOTE — Assessment & Plan Note (Signed)
Controlled.  

## 2019-05-30 NOTE — Assessment & Plan Note (Signed)
SOB with exertion as outlined.  Some cough. No increased chest congestion.  Tested for covid yesterday.  Results pending at visit.  Treat with prednisone taper as directed.  Hold abx.  Saline nasal spray and steroid nasal spray as directed.  Follow.

## 2019-06-02 ENCOUNTER — Encounter: Payer: Self-pay | Admitting: Internal Medicine

## 2019-06-07 DIAGNOSIS — I2510 Atherosclerotic heart disease of native coronary artery without angina pectoris: Secondary | ICD-10-CM | POA: Diagnosis not present

## 2019-06-07 DIAGNOSIS — Z720 Tobacco use: Secondary | ICD-10-CM | POA: Diagnosis not present

## 2019-06-07 DIAGNOSIS — J410 Simple chronic bronchitis: Secondary | ICD-10-CM | POA: Diagnosis not present

## 2019-06-07 DIAGNOSIS — G4733 Obstructive sleep apnea (adult) (pediatric): Secondary | ICD-10-CM | POA: Diagnosis not present

## 2019-06-07 DIAGNOSIS — I272 Pulmonary hypertension, unspecified: Secondary | ICD-10-CM | POA: Diagnosis not present

## 2019-06-07 DIAGNOSIS — R079 Chest pain, unspecified: Secondary | ICD-10-CM | POA: Diagnosis not present

## 2019-06-07 DIAGNOSIS — I10 Essential (primary) hypertension: Secondary | ICD-10-CM | POA: Diagnosis not present

## 2019-06-07 DIAGNOSIS — E7800 Pure hypercholesterolemia, unspecified: Secondary | ICD-10-CM | POA: Diagnosis not present

## 2019-06-07 DIAGNOSIS — R05 Cough: Secondary | ICD-10-CM | POA: Diagnosis not present

## 2019-06-07 DIAGNOSIS — Z9889 Other specified postprocedural states: Secondary | ICD-10-CM | POA: Diagnosis not present

## 2019-06-13 NOTE — Progress Notes (Signed)
[place]  [phone] [phone]  ID: Duane Lope [place]bee OB: [birth year]  MR#: [id number]  ZSW#:[id number]  Patient Care Team: Einar Pheasant, MD as PCP - General (Internal Medicine) Lollie Sails, MD as Consulting Physician (Gastroenterology) Ok Edwards, NP as Nurse Practitioner (Gastroenterology) [name], [facility] as Pharmacist (Pharmacist)  CHIEF COMPLAINT: [name]'s macroglobulinemia, iron deficiency anemia.  INTERVAL HISTORY: Patient returns to clinic today for repeat laboratory, further evaluation, and continuation of IV Feraheme.  Patient noticed improvement of his weakness and fatigue after receiving treatment approximately 10 weeks ago, but states this was short-lived and he now has increasing weakness and fatigue.  He continues to have chronic shortness of breath and dyspnea on exertion as well. He has no neurologic complaints. He denies any recent fevers or illnesses.  He has a good appetite and denies weight loss.  He denies any chest pain, cough, or hemoptysis.  He denies any nausea, vomiting, constipation, or diarrhea.  He denies any melena or hematochezia.  He has no urinary complaints.  Patient offers no further specific complaints today.  REVIEW OF SYSTEMS:   Review of Systems  Constitutional: Positive for malaise/fatigue. Negative for fever and weight loss.  Respiratory: Positive for shortness of breath. Negative for cough and hemoptysis.   Cardiovascular: Negative.  Negative for chest pain and leg swelling.  Gastrointestinal: Negative.  Negative for abdominal pain, blood in stool, diarrhea and melena.  Genitourinary: Negative.  Negative for hematuria.  Musculoskeletal: Negative.  Negative for back pain.  Skin: Negative.  Negative for rash.  Neurological: Positive for weakness. Negative for sensory change and focal weakness.  Psychiatric/Behavioral: Negative.  The patient is not nervous/anxious.      As per HPI. Otherwise, a complete review of systems is negative.  PAST MEDICAL HISTORY: Past Medical History:  Diagnosis Date  . Adrenal gland anomaly    enlargement  . Anxiety   . CAD (coronary artery disease)   . Carpal tunnel syndrome   . Chronic hyponatremia   . Colitis   . Colonic polyp   . COPD (chronic obstructive pulmonary disease) ([place])   . Degenerative disc disease, lumbar   . Depression   . Diverticulosis   . Dyspnea   . GERD (gastroesophageal reflux disease)   . H/O degenerative disc disease   . Hypercholesterolemia   . Hyperkalemia   . Hyperlipidemia   . Hypertension   . Irritable bowel syndrome   . Monoclonal gammopathy   . Monoclonal gammopathy   . Neuropathy   . Personal history of tobacco use, presenting hazards to health 08/17/2015  . Sleep apnea   . Vertebral compression fracture ([place])     PAST SURGICAL HISTORY: Past Surgical History:  Procedure Laterality Date  . BUNIONECTOMY  1989  . CARPAL TUNNEL RELEASE Left 2011   ulnar nerve sub muscular at elbow  . CHOLECYSTECTOMY  09/07  . COLONOSCOPY WITH PROPOFOL N/A 03/05/2017   Procedure: COLONOSCOPY WITH PROPOFOL;  Surgeon: Lucilla Lame, MD;  Location: [facility] ENDOSCOPY;  Service: Endoscopy;  Laterality: N/A;  . ELECTROPHYSIOLOGIC STUDY N/A 11/15/2015   Procedure: CARDIOVERSION;  Surgeon: Isaias Cowman, MD;  Location: [facility] ORS;  Service: Cardiovascular;  Laterality: N/A;  . ESOPHAGOGASTRODUODENOSCOPY (EGD) WITH PROPOFOL N/A 03/05/2017   Procedure: ESOPHAGOGASTRODUODENOSCOPY (EGD) WITH PROPOFOL;  Surgeon: Lucilla Lame, MD;  Location: [facility] ENDOSCOPY;  Service: Endoscopy;  Laterality: N/A;  . ESOPHAGOGASTRODUODENOSCOPY (EGD) WITH PROPOFOL N/A 10/17/2017   Procedure: ESOPHAGOGASTRODUODENOSCOPY (EGD) WITH PROPOFOL;  Surgeon: Lollie Sails, MD;  Location: [facility] ENDOSCOPY;  Service: Endoscopy;  Laterality: N/A;  . EYE SURGERY Bilateral 2010   cataract  . HEMORRHOID SURGERY    . KYPHOPLASTY N/A 11/04/2016    Procedure: KYPHOPLASTY T 12;  Surgeon: Hessie Knows, MD;  Location: [facility] ORS;  Service: Orthopedics;  Laterality: N/A;    FAMILY HISTORY Family History  Problem Relation Age of Onset  . Stroke Mother   . Heart disease Father        MI - 47   . Colon cancer Neg Hx   . Prostate cancer Neg Hx        ADVANCED DIRECTIVES:    HEALTH MAINTENANCE: Social History   Tobacco Use  . Smoking status: Former Smoker    Types: Cigarettes    Quit date: 11/01/2015    Years since quitting: 3.6  . Smokeless tobacco: Never Used  . Tobacco comment: about 2 cigarettes per day, trying to quit  Substance Use Topics  . Alcohol use: Yes    Alcohol/week: 42.0 standard drinks    Types: 42 Cans of beer per week    Comment: occas  . Drug use: No     Colonoscopy:  PAP:  Bone density:  Lipid panel:  Allergies  Allergen Reactions  . Iodinated Diagnostic Agents Other (See Comments)    Contraindication secondary to IGMgammopathy/Waldren's syndrome   Not to be given due to Waldenstrom's syndrome, told it could affect kidney function    Current Outpatient Medications  Medication Sig Dispense Refill  . acetaminophen (TYLENOL) 500 MG tablet Take 500-1,000 mg by mouth 3 (three) times daily as needed for moderate pain or headache.    . albuterol (VENTOLIN HFA) 108 (90 Base) MCG/ACT inhaler Inhale 2 puffs into the lungs every 6 (six) hours as needed for wheezing or shortness of breath. 54 g 1  . amLODipine (NORVASC) 10 MG tablet Take 1 tablet (10 mg total) by mouth daily. 30 tablet 0  . azelastine (ASTELIN) 0.1 % nasal spray Place 1 spray into both nostrils 2 (two) times daily. Use in each nostril as directed 30 mL 1  . budesonide (ENTOCORT EC) 3 MG 24 hr capsule Take 9 mg by mouth daily as needed (colitis flare).     . calcium-vitamin D (OSCAL WITH D) 500-200 MG-UNIT tablet Take 2 tablets by mouth daily.    Marland Kitchen ELIQUIS 5 MG TABS tablet Take 5 mg by mouth 2 (two) times daily.     Marland Kitchen FIBER PO Take 1  capsule by mouth daily.     . fluticasone (FLONASE) 50 MCG/ACT nasal spray Place 2 sprays into both nostrils daily. (Patient not taking: Reported on 05/10/2019) 16 g 0  . fluticasone furoate-vilanterol (BREO ELLIPTA) 100-25 MCG/INH AEPB INHALE 1 PUFF INTO LUNGS DAILY. 180 each 1  . furosemide (LASIX) 20 MG tablet Take 1 tablet (20 mg total) by mouth daily as needed. (Patient taking differently: Take 40 mg by mouth daily. ) 30 tablet 0  . gabapentin (NEURONTIN) 100 MG capsule Take 4 capsules (400 mg) daily in the morning and midday. Take 8 capsules (800 mg) daily in the evening. 480 capsule 0  . isosorbide mononitrate (IMDUR) 30 MG 24 hr tablet Take 1 tablet (30 mg total) by mouth once daily.    . metoprolol tartrate (LOPRESSOR) 100 MG tablet Take 1 tablet (100 mg total) by mouth 2 (two) times daily. 60 tablet 0  . mupirocin ointment (BACTROBAN) 2 % Apply to affected area bid. (Patient not taking: Reported on 05/10/2019) 22  g 0  . pantoprazole (PROTONIX) 40 MG tablet Take 1 tablet (40 mg total) by mouth 2 (two) times daily. 180 tablet 0  . predniSONE (DELTASONE) 10 MG tablet Take 4 tablets x 1 day and then decrease by 1/2 tablet per day until down to zero mg. 18 tablet 0  . sertraline (ZOLOFT) 50 MG tablet Take 1 tablet (50 mg total) by mouth daily. 90 tablet 1  . tiZANidine (ZANAFLEX) 2 MG tablet     . vitamin B-12 (CYANOCOBALAMIN) 1000 MCG tablet Take 1,000 mcg by mouth daily.      No current facility-administered medications for this visit.     OBJECTIVE: Vitals:   06/17/19 1414  BP: 127/73  Pulse: 64  Temp: 99.4 F (37.4 C)     Body mass index is 26.01 kg/m.    ECOG FS:0 - Asymptomatic  General: Well-developed, well-nourished, no acute distress. Eyes: Pink conjunctiva, anicteric sclera. HEENT: Normocephalic, moist mucous membranes. Lungs: Clear to auscultation bilaterally. Heart: Regular rate and rhythm. No rubs, murmurs, or gallops. Abdomen: Soft, nontender, nondistended. No  organomegaly noted, normoactive bowel sounds. Musculoskeletal: No edema, cyanosis, or clubbing. Neuro: Alert, answering all questions appropriately. Cranial nerves grossly intact. Skin: No rashes or petechiae noted. Psych: Normal affect.  LAB RESULTS:  Lab Results  Component Value Date   NA 130 (L) 06/17/2019   K 4.5 06/17/2019   CL 95 (L) 06/17/2019   CO2 24 06/17/2019   GLUCOSE 144 (H) 06/17/2019   BUN 13 06/17/2019   CREATININE 1.09 06/17/2019   CALCIUM 9.5 06/17/2019   PROT 8.2 11/16/2018   ALBUMIN 3.6 11/16/2018   AST 16 11/16/2018   ALT 11 11/16/2018   ALKPHOS 71 11/16/2018   BILITOT 0.3 11/16/2018   GFRNONAA >60 06/17/2019   GFRAA >60 06/17/2019    Lab Results  Component Value Date   WBC 6.2 06/17/2019   NEUTROABS 4.1 06/17/2019   HGB 9.7 (L) 06/17/2019   HCT 29.9 (L) 06/17/2019   MCV 106.4 (H) 06/17/2019   PLT 291 06/17/2019   Lab Results  Component Value Date   IRON 60 06/17/2019   TIBC 998 (H) 06/17/2019   IRONPCTSAT 6 (L) 06/17/2019    Lab Results  Component Value Date   FERRITIN 17 (L) 06/17/2019   Lab Results  Component Value Date   TOTALPROTELP 7.7 04/06/2019   ALBUMINELP 3.5 04/06/2019   A1GS 0.2 04/06/2019   A2GS 0.6 04/06/2019   BETS 1.3 04/06/2019   BETA2SER 0.2 02/14/2016   GAMS 2.1 (H) 04/06/2019   MSPIKE 1.8 (H) 04/06/2019   SPEI Comment 04/06/2019    STUDIES: No results found.  ASSESSMENT: [name]'s macroglobulinemia, iron deficiency anemia.  PLAN:    1. [name]'s macroglobulinemia:  Bone marrow biopsy results from Jan 08, 2017 reviewed independently confirming underlying [name]'s.  Patient's M spike has been erratic ranging from 1.1 in May 2018 to as high as 2.6 in October 2018.  His most recent result on April 06, 2019 was reported at 1.8, today's result is pending.  IgM levels remain persistently elevated greater than 3000, most recently 3178. His kappa/lambda light chain ratio has ranged from 4.16 to 5.71,  today's result is pending.  He has persistent iron deficiency anemia from a GI source, but no other evidence of endorgan damage. He does not require any treatment at this time. If he had progression of disease as evidenced by B-symptoms or endorgan damage, will consider Rituxan-based therapy.  Continue to monitor every 3 to 6 months.  2. 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  Despite receiving IV Feraheme 2 months ago, his hemoglobin and iron stores are decreased and essentially unchanged.  He is also symptomatic.  Proceed with 510 mg IV Feraheme today.  Return to clinic in 1 week for second infusion.  Patient will then return to clinic in 8 weeks for repeat laboratory work, further evaluation, and consideration of additional treatment if necessary.   3.  Back pain: Chronic and unchanged.  Continue monitoring and treatment per pain clinic.  I spent a total of 30 minutes face-to-face with the patient of which greater than 50% of the visit was spent in counseling and coordination of care as detailed above.    Patient expressed understanding and was in agreement with this plan. He also understands that He can call clinic at any time with any questions, concerns, or complaints.    Lloyd Huger, MD   06/18/2019 11:18 AM

## 2019-06-14 ENCOUNTER — Other Ambulatory Visit: Payer: Self-pay | Admitting: Internal Medicine

## 2019-06-14 MED ORDER — ALBUTEROL SULFATE HFA 108 (90 BASE) MCG/ACT IN AERS: 2.0000 | INHALATION_SPRAY | Freq: Four times a day (QID) | RESPIRATORY_TRACT | 1 refills | Status: DC | PRN

## 2019-06-14 MED ORDER — BREO ELLIPTA 100-25 MCG/INH IN AEPB: INHALATION_SPRAY | RESPIRATORY_TRACT | 1 refills | Status: DC

## 2019-06-14 NOTE — Progress Notes (Signed)
rx ok'd for breo and ventolin.  rx printed.

## 2019-06-15 ENCOUNTER — Other Ambulatory Visit: Payer: Self-pay

## 2019-06-15 ENCOUNTER — Ambulatory Visit (INDEPENDENT_AMBULATORY_CARE_PROVIDER_SITE_OTHER): Payer: PPO | Admitting: Internal Medicine

## 2019-06-15 DIAGNOSIS — E871 Hypo-osmolality and hyponatremia: Secondary | ICD-10-CM

## 2019-06-15 DIAGNOSIS — D509 Iron deficiency anemia, unspecified: Secondary | ICD-10-CM | POA: Diagnosis not present

## 2019-06-15 DIAGNOSIS — M549 Dorsalgia, unspecified: Secondary | ICD-10-CM | POA: Diagnosis not present

## 2019-06-15 DIAGNOSIS — F320 Major depressive disorder, single episode, mild: Secondary | ICD-10-CM

## 2019-06-15 DIAGNOSIS — I272 Pulmonary hypertension, unspecified: Secondary | ICD-10-CM

## 2019-06-15 DIAGNOSIS — I10 Essential (primary) hypertension: Secondary | ICD-10-CM

## 2019-06-15 DIAGNOSIS — L989 Disorder of the skin and subcutaneous tissue, unspecified: Secondary | ICD-10-CM

## 2019-06-15 DIAGNOSIS — J449 Chronic obstructive pulmonary disease, unspecified: Secondary | ICD-10-CM

## 2019-06-15 DIAGNOSIS — K52831 Collagenous colitis: Secondary | ICD-10-CM

## 2019-06-15 DIAGNOSIS — M79606 Pain in leg, unspecified: Secondary | ICD-10-CM

## 2019-06-15 DIAGNOSIS — E785 Hyperlipidemia, unspecified: Secondary | ICD-10-CM | POA: Diagnosis not present

## 2019-06-15 DIAGNOSIS — K219 Gastro-esophageal reflux disease without esophagitis: Secondary | ICD-10-CM

## 2019-06-15 DIAGNOSIS — F1010 Alcohol abuse, uncomplicated: Secondary | ICD-10-CM

## 2019-06-15 DIAGNOSIS — I712 Thoracic aortic aneurysm, without rupture, unspecified: Secondary | ICD-10-CM

## 2019-06-15 DIAGNOSIS — G8929 Other chronic pain: Secondary | ICD-10-CM

## 2019-06-15 DIAGNOSIS — I4892 Unspecified atrial flutter: Secondary | ICD-10-CM

## 2019-06-15 DIAGNOSIS — C880 Waldenstrom macroglobulinemia: Secondary | ICD-10-CM

## 2019-06-15 DIAGNOSIS — I2510 Atherosclerotic heart disease of native coronary artery without angina pectoris: Secondary | ICD-10-CM

## 2019-06-15 DIAGNOSIS — F32A Depression, unspecified: Secondary | ICD-10-CM

## 2019-06-15 NOTE — Progress Notes (Signed)
Patient ID: Troy Lopez, male   DOB: 01/12/1941, 78 y.o.   MRN: [id number]   Virtual Visit via video Note  This visit type was conducted due to national recommendations for restrictions regarding the COVID-19 pandemic (e.g. social distancing).  This format is felt to be most appropriate for this patient at this time.  All issues noted in this document were discussed and addressed.  No physical exam was performed (except for noted visual exam findings with Video Visits).   I connected with Troy Lopez by a video enabled telemedicine application and verified that I am speaking with the correct person using two identifiers. Location patient: home Location provider: work Persons participating in the virtual visit: patient, provider  I discussed the limitations, risks, security and privacy concerns of performing an evaluation and management service by telephone and the availability of in person appointments.  The patient expressed understanding and agreed to proceed.  Interactive audio and video telecommunications were attempted between this provider and patient and were successful initially.  After only a few minutes we had to convert to a telephone visit due to not being able to hear.  We continued and completed visit with audio only. Pt was in agreement.    Reason for visit: scheduled follow up.   HPI: He was recently treated for cough and congestion.  covid test - negative.  No increased cough now.  No increased chest congestion or increased sob.  Feels better.  States when he first gets up, ankles sore.  Also notices soreness at night.  Some aching in his thighs.  Has had issues with his back.  Reports bilateral leg pain now.  Has been seen by Dr Sharlet Salina and pain clinic previously.  Request referral back to pain clinic - Dr Holley Raring.  Has been to PT.  Doing exercises.  No chest pain.  No acid reflux.  GI symptoms stable.  Saw cardiology 06/07/19.  Stable.  Recommended f/u in 4 months. On  eliquis.  Seeing hematology for f/u anemia.  Receiving iron infusions.  Received reclast 02/24/19.  Has scalp lesions.  Responded initially to [place].  Returned. Plans to f/u with Dr Phillip Heal to try and prevent from occurring.  Blood pressure doing well.     ROS: See pertinent positives and negatives per HPI.  Past Medical History:  Diagnosis Date   Adrenal gland anomaly    enlargement   Anxiety    CAD (coronary artery disease)    Carpal tunnel syndrome    Chronic hyponatremia    Colitis    Colonic polyp    COPD (chronic obstructive pulmonary disease) (HCC)    Degenerative disc disease, lumbar    Depression    Diverticulosis    Dyspnea    GERD (gastroesophageal reflux disease)    H/O degenerative disc disease    Hypercholesterolemia    Hyperkalemia    Hyperlipidemia    Hypertension    Irritable bowel syndrome    Monoclonal gammopathy    Monoclonal gammopathy    Neuropathy    Personal history of tobacco use, presenting hazards to health 08/17/2015   Sleep apnea    Vertebral compression fracture [facility])     Past Surgical History:  Procedure Laterality Date   BUNIONECTOMY  1989   CARPAL TUNNEL RELEASE Left 2011   ulnar nerve sub muscular at elbow   CHOLECYSTECTOMY  09/07   COLONOSCOPY WITH PROPOFOL N/A 03/05/2017   Procedure: COLONOSCOPY WITH PROPOFOL;  Surgeon: Lucilla Lame, MD;  Location: [facility] ENDOSCOPY;  Service: Endoscopy;  Laterality: N/A;   ELECTROPHYSIOLOGIC STUDY N/A 11/15/2015   Procedure: CARDIOVERSION;  Surgeon: Isaias Cowman, MD;  Location: [facility] ORS;  Service: Cardiovascular;  Laterality: N/A;   ESOPHAGOGASTRODUODENOSCOPY (EGD) WITH PROPOFOL N/A 03/05/2017   Procedure: ESOPHAGOGASTRODUODENOSCOPY (EGD) WITH PROPOFOL;  Surgeon: Lucilla Lame, MD;  Location: [facility] ENDOSCOPY;  Service: Endoscopy;  Laterality: N/A;   ESOPHAGOGASTRODUODENOSCOPY (EGD) WITH PROPOFOL N/A 10/17/2017   Procedure: ESOPHAGOGASTRODUODENOSCOPY (EGD) WITH PROPOFOL;   Surgeon: Lollie Sails, MD;  Location: [facility] ENDOSCOPY;  Service: Endoscopy;  Laterality: N/A;   EYE SURGERY Bilateral 2010   cataract   HEMORRHOID SURGERY     KYPHOPLASTY N/A 11/04/2016   Procedure: KYPHOPLASTY T 12;  Surgeon: Hessie Knows, MD;  Location: [facility] ORS;  Service: Orthopedics;  Laterality: N/A;    Family History  Problem Relation Age of Onset   Stroke Mother    Heart disease Father        MI - 23    Colon cancer Neg Hx    Prostate cancer Neg Hx     SOCIAL HX: reviewed.    Current Outpatient Medications:    acetaminophen (TYLENOL) 500 MG tablet, Take 500-1,000 mg by mouth 3 (three) times daily as needed for moderate pain or headache., Disp: , Rfl:    albuterol (VENTOLIN HFA) 108 (90 Base) MCG/ACT inhaler, Inhale 2 puffs into the lungs every 6 (six) hours as needed for wheezing or shortness of breath., Disp: 54 g, Rfl: 1   amLODipine (NORVASC) 10 MG tablet, Take 1 tablet (10 mg total) by mouth daily., Disp: 30 tablet, Rfl: 0   azelastine (ASTELIN) 0.1 % nasal spray, Place 1 spray into both nostrils 2 (two) times daily. Use in each nostril as directed, Disp: 30 mL, Rfl: 1   budesonide (ENTOCORT EC) 3 MG 24 hr capsule, Take 9 mg by mouth daily as needed (colitis flare). , Disp: , Rfl:    calcium-vitamin D (OSCAL WITH D) 500-200 MG-UNIT tablet, Take 2 tablets by mouth daily., Disp: , Rfl:    ELIQUIS 5 MG TABS tablet, Take 5 mg by mouth 2 (two) times daily. , Disp: , Rfl:    FIBER PO, Take 1 capsule by mouth daily. , Disp: , Rfl:    fluticasone (FLONASE) 50 MCG/ACT nasal spray, Place 2 sprays into both nostrils daily. (Patient not taking: Reported on 05/10/2019), Disp: 16 g, Rfl: 0   fluticasone furoate-vilanterol (BREO ELLIPTA) 100-25 MCG/INH AEPB, INHALE 1 PUFF INTO LUNGS DAILY., Disp: 180 each, Rfl: 1   furosemide (LASIX) 20 MG tablet, Take 1 tablet (20 mg total) by mouth daily as needed. (Patient taking differently: Take 40 mg by mouth daily. ), Disp: 30  tablet, Rfl: 0   gabapentin (NEURONTIN) 100 MG capsule, Take 4 capsules (400 mg) daily in the morning and midday. Take 8 capsules (800 mg) daily in the evening., Disp: 480 capsule, Rfl: 0   isosorbide mononitrate (IMDUR) 30 MG 24 hr tablet, Take 1 tablet (30 mg total) by mouth once daily., Disp: , Rfl:    metoprolol tartrate (LOPRESSOR) 100 MG tablet, Take 1 tablet (100 mg total) by mouth 2 (two) times daily., Disp: 60 tablet, Rfl: 0   mupirocin ointment (BACTROBAN) 2 %, Apply to affected area bid. (Patient not taking: Reported on 05/10/2019), Disp: 22 g, Rfl: 0   pantoprazole (PROTONIX) 40 MG tablet, Take 1 tablet (40 mg total) by mouth 2 (two) times daily., Disp: 180 tablet, Rfl: 0   predniSONE (DELTASONE) 10 MG tablet, Take 4  tablets x 1 day and then decrease by 1/2 tablet per day until down to zero mg., Disp: 18 tablet, Rfl: 0   sertraline (ZOLOFT) 50 MG tablet, Take 1 tablet (50 mg total) by mouth daily., Disp: 90 tablet, Rfl: 1   tiZANidine (ZANAFLEX) 2 MG tablet, , Disp: , Rfl:    vitamin B-12 (CYANOCOBALAMIN) 1000 MCG tablet, Take 1,000 mcg by mouth daily. , Disp: , Rfl:   EXAM:  GENERAL: alert, oriented, appears well and in no acute distress  HEENT: atraumatic, conjunttiva clear, no obvious abnormalities on inspection of external nose and ears  NECK: normal movements of the head and neck  LUNGS: on inspection no signs of respiratory distress, breathing rate appears normal, no obvious gross SOB, gasping or wheezing  CV: no obvious cyanosis  PSYCH/NEURO: pleasant and cooperative, no obvious depression or anxiety, speech and thought processing grossly intact  ASSESSMENT AND PLAN:  Discussed the following assessment and plan:  Hyperlipemia Low cholesterol diet and exercise.  Follow lipid panel.   Alcohol abuse Discussed again with him today to decrease alcohol intake.    Atrial flutter ([place]) S/p cardioversion.  On eliquis.  Followed by cardiology.  Stable.    CAD  (coronary artery disease) Followed by cardiology.  Stable.    Chronic back pain Has seen Dr Sharlet Salina and pain clinic previously.  With increased leg pain as outlined.  Discussed may be originating from his back.  Request referral back to pain clinic - Dr Holley Raring.  Has been to physical therapy and doing eercises.    Collagenous colitis Followed by GI. Bowels stable.   Mild depression ([place]) On zoloft.  Stable.   GERD (gastroesophageal reflux disease) Controlled.   Hypertension States blood pressure has been doing well.  Follow pressures.  Follow metabolic panel.    Hyponatremia Has been worked up extensively.  Has been evaluated by nephrology.  Stable.  Follow.   Iron deficiency anemia Followed by hematology.  Has f/u this week.   Leg pain Bilateral leg pain.  Refer to pain clinic as outlined.    Moderate COPD (chronic obstructive pulmonary disease) (HCC) Breathing stable.    Pulmonary hypertension ([place]) Followed by pulmonary and cardiology.   Thoracic aortic aneurysm ([place]) Has a 4cm ascending thoracic aneurysm.  Recommended chest CT f/u in one year.  Last evaluated 04/2018.  Due f/u.    Waldenstrom macroglobulinemia ([place]) Followed by hematology.    Scalp lesion Recurring scalp lesions.  bactroban helps, but reoccurs.  Plans to f/u with Dr Phillip Heal.      I discussed the assessment and treatment plan with the patient. The patient was provided an opportunity to ask questions and all were answered. The patient agreed with the plan and demonstrated an understanding of the instructions.   The patient was advised to call back or seek an in-person evaluation if the symptoms worsen or if the condition fails to improve as anticipated.  I provided 25 minutes of non-face-to-face time during this encounter.   Einar Pheasant, MD

## 2019-06-15 NOTE — Progress Notes (Signed)
Patient ID: Troy Lopez, male   DOB: 11-Jun-1941, 78 y.o.   MRN: [id number]

## 2019-06-17 ENCOUNTER — Inpatient Hospital Stay: Payer: PPO | Attending: Oncology

## 2019-06-17 ENCOUNTER — Encounter: Payer: Self-pay | Admitting: Oncology

## 2019-06-17 ENCOUNTER — Ambulatory Visit: Payer: Self-pay | Admitting: Pharmacist

## 2019-06-17 ENCOUNTER — Other Ambulatory Visit: Payer: Self-pay

## 2019-06-17 ENCOUNTER — Inpatient Hospital Stay: Payer: PPO

## 2019-06-17 ENCOUNTER — Inpatient Hospital Stay (HOSPITAL_BASED_OUTPATIENT_CLINIC_OR_DEPARTMENT_OTHER): Payer: PPO | Admitting: Oncology

## 2019-06-17 VITALS — BP 127/73 | HR 64 | Temp 99.4°F | Wt 186.5 lb

## 2019-06-17 DIAGNOSIS — E785 Hyperlipidemia, unspecified: Secondary | ICD-10-CM | POA: Diagnosis not present

## 2019-06-17 DIAGNOSIS — F419 Anxiety disorder, unspecified: Secondary | ICD-10-CM | POA: Diagnosis not present

## 2019-06-17 DIAGNOSIS — C880 Waldenstrom macroglobulinemia: Secondary | ICD-10-CM

## 2019-06-17 DIAGNOSIS — Z8249 Family history of ischemic heart disease and other diseases of the circulatory system: Secondary | ICD-10-CM | POA: Diagnosis not present

## 2019-06-17 DIAGNOSIS — Z7901 Long term (current) use of anticoagulants: Secondary | ICD-10-CM | POA: Insufficient documentation

## 2019-06-17 DIAGNOSIS — E7800 Pure hypercholesterolemia, unspecified: Secondary | ICD-10-CM | POA: Insufficient documentation

## 2019-06-17 DIAGNOSIS — D509 Iron deficiency anemia, unspecified: Secondary | ICD-10-CM

## 2019-06-17 DIAGNOSIS — F329 Major depressive disorder, single episode, unspecified: Secondary | ICD-10-CM | POA: Insufficient documentation

## 2019-06-17 DIAGNOSIS — J449 Chronic obstructive pulmonary disease, unspecified: Secondary | ICD-10-CM | POA: Insufficient documentation

## 2019-06-17 DIAGNOSIS — G4730 Sleep apnea, unspecified: Secondary | ICD-10-CM | POA: Diagnosis not present

## 2019-06-17 DIAGNOSIS — K589 Irritable bowel syndrome without diarrhea: Secondary | ICD-10-CM | POA: Diagnosis not present

## 2019-06-17 DIAGNOSIS — Z7951 Long term (current) use of inhaled steroids: Secondary | ICD-10-CM | POA: Insufficient documentation

## 2019-06-17 DIAGNOSIS — I10 Essential (primary) hypertension: Secondary | ICD-10-CM | POA: Diagnosis not present

## 2019-06-17 DIAGNOSIS — Z87891 Personal history of nicotine dependence: Secondary | ICD-10-CM | POA: Insufficient documentation

## 2019-06-17 DIAGNOSIS — Z79899 Other long term (current) drug therapy: Secondary | ICD-10-CM | POA: Insufficient documentation

## 2019-06-17 LAB — BASIC METABOLIC PANEL
Anion gap: 11 (ref 5–15)
BUN: 13 mg/dL (ref 8–23)
CO2: 24 mmol/L (ref 22–32)
Calcium: 9.5 mg/dL (ref 8.9–10.3)
Chloride: 95 mmol/L — ABNORMAL LOW (ref 98–111)
Creatinine, Ser: 1.09 mg/dL (ref 0.61–1.24)
GFR calc Af Amer: >60 mL/min (ref >60)
GFR calc non Af Amer: >60 mL/min (ref >60)
Glucose, Bld: 144 mg/dL — ABNORMAL HIGH (ref 70–99)
Potassium: 4.5 mmol/L (ref 3.5–5.1)
Sodium: 130 mmol/L — ABNORMAL LOW (ref 135–145)

## 2019-06-17 LAB — IRON AND TIBC
Iron: 60 ug/dL (ref 45–182)
Saturation Ratios: 6 % — ABNORMAL LOW (ref 17.9–39.5)
TIBC: 998 ug/dL — ABNORMAL HIGH (ref 250–450)
UIBC: 938 ug/dL

## 2019-06-17 LAB — CBC WITH DIFFERENTIAL/PLATELET
Abs Immature Granulocytes: 0.02 10*3/uL (ref 0.00–0.07)
Basophils Absolute: 0.1 10*3/uL (ref 0.0–0.1)
Basophils Relative: 1 %
Eosinophils Absolute: 0 10*3/uL (ref 0.0–0.5)
Eosinophils Relative: 0 %
HCT: 29.9 % — ABNORMAL LOW (ref 39.0–52.0)
Hemoglobin: 9.7 g/dL — ABNORMAL LOW (ref 13.0–17.0)
Immature Granulocytes: 0 %
Lymphocytes Relative: 25 %
Lymphs Abs: 1.5 10*3/uL (ref 0.7–4.0)
MCH: 34.5 pg — ABNORMAL HIGH (ref 26.0–34.0)
MCHC: 32.4 g/dL (ref 30.0–36.0)
MCV: 106.4 fL — ABNORMAL HIGH (ref 80.0–100.0)
Monocytes Absolute: 0.5 10*3/uL (ref 0.1–1.0)
Monocytes Relative: 8 %
Neutro Abs: 4.1 10*3/uL (ref 1.7–7.7)
Neutrophils Relative %: 66 %
Platelets: 291 10*3/uL (ref 150–400)
RBC: 2.81 MIL/uL — ABNORMAL LOW (ref 4.22–5.81)
RDW: 14.4 % (ref 11.5–15.5)
WBC: 6.2 10*3/uL (ref 4.0–10.5)
nRBC: 0 % (ref 0.0–0.2)

## 2019-06-17 LAB — FERRITIN: Ferritin: 17 ng/mL — ABNORMAL LOW (ref 24–336)

## 2019-06-17 MED ORDER — SODIUM CHLORIDE 0.9 % IV SOLN
510.0000 mg | Freq: Once | INTRAVENOUS | Status: AC
  Administered 2019-06-17: 15:00:00 510 mg via INTRAVENOUS
  Filled 2019-06-17: qty 17

## 2019-06-17 MED ORDER — SODIUM CHLORIDE 0.9 % IV SOLN
INTRAVENOUS | Status: DC
  Administered 2019-06-17: 15:00:00 via INTRAVENOUS
  Filled 2019-06-17: qty 250

## 2019-06-17 NOTE — Chronic Care Management (AMB) (Signed)
Chronic Care Management   Follow Up Note   06/17/2019 Name: Troy Lopez MRN: [id number] DOB: 1941-06-20  Referred by: Einar Pheasant, MD Reason for referral : Chronic Care Management (Medication Management)   Troy Lopez is a 78 y.o. year old male who is a primary care patient of Einar Pheasant, MD. The CCM team was consulted for assistance with chronic disease management and care coordination needs.    Received message from patient with medication access needs.   Review of patient status, including review of consultants reports, relevant laboratory and other test results, and collaboration with appropriate care team members and the patient's provider was performed as part of comprehensive patient evaluation and provision of chronic care management services.    SDOH (Social Determinants of Health) screening performed today: Financial Strain . See Care Plan for related entries.   Advanced Directives Status: N See Care Plan and [place] application for related entries.  Outpatient Encounter Medications as of 06/17/2019  Medication Sig Note  . acetaminophen (TYLENOL) 500 MG tablet Take 500-1,000 mg by mouth 3 (three) times daily as needed for moderate pain or headache. 02/22/2019: 2 tablets 3 times daily   . albuterol (VENTOLIN HFA) 108 (90 Base) MCG/ACT inhaler Inhale 2 puffs into the lungs every 6 (six) hours as needed for wheezing or shortness of breath.   Marland Kitchen amLODipine (NORVASC) 10 MG tablet Take 1 tablet (10 mg total) by mouth daily.   Marland Kitchen azelastine (ASTELIN) 0.1 % nasal spray Place 1 spray into both nostrils 2 (two) times daily. Use in each nostril as directed 02/22/2019: Using PRN  . budesonide (ENTOCORT EC) 3 MG 24 hr capsule Take 9 mg by mouth daily as needed (colitis flare).    . calcium-vitamin D (OSCAL WITH D) 500-200 MG-UNIT tablet Take 2 tablets by mouth daily.   Marland Kitchen ELIQUIS 5 MG TABS tablet Take 5 mg by mouth 2 (two) times daily.    Marland Kitchen FIBER PO Take 1 capsule by mouth daily.     . fluticasone (FLONASE) 50 MCG/ACT nasal spray Place 2 sprays into both nostrils daily. (Patient not taking: Reported on 05/10/2019)   . fluticasone furoate-vilanterol (BREO ELLIPTA) 100-25 MCG/INH AEPB INHALE 1 PUFF INTO LUNGS DAILY.   . furosemide (LASIX) 20 MG tablet Take 1 tablet (20 mg total) by mouth daily as needed. (Patient taking differently: Take 40 mg by mouth daily. )   . gabapentin (NEURONTIN) 100 MG capsule Take 4 capsules (400 mg) daily in the morning and midday. Take 8 capsules (800 mg) daily in the evening. 05/10/2019: Using PRN   . isosorbide mononitrate (IMDUR) 30 MG 24 hr tablet Take 1 tablet (30 mg total) by mouth once daily.   . metoprolol tartrate (LOPRESSOR) 100 MG tablet Take 1 tablet (100 mg total) by mouth 2 (two) times daily. 05/10/2019: 50 mg BID  . mupirocin ointment (BACTROBAN) 2 % Apply to affected area bid. (Patient not taking: Reported on 05/10/2019)   . pantoprazole (PROTONIX) 40 MG tablet Take 1 tablet (40 mg total) by mouth 2 (two) times daily.   . predniSONE (DELTASONE) 10 MG tablet Take 4 tablets x 1 day and then decrease by 1/2 tablet per day until down to zero mg.   . sertraline (ZOLOFT) 50 MG tablet Take 1 tablet (50 mg total) by mouth daily.   Marland Kitchen tiZANidine (ZANAFLEX) 2 MG tablet  05/10/2019: Taking 1-3 times daily   . vitamin B-12 (CYANOCOBALAMIN) 1000 MCG tablet Take 1,000 mcg by mouth daily.  No facility-administered encounter medications on file as of 06/17/2019.      Goals Addressed            This Visit's Progress     Patient Stated   . "I can't afford my medications" (pt-stated)       Current Barriers:  . Financial Barriers - RESOLVED . APPROVED for Eliquis through 08/19/2019 . APPROVED for Breo/Ventolin through [place] through 08/19/2019 . Patient called me today noting that he has not yet received refills on Breo or Ventolin from [place] patient assistance.   Pharmacist Clinical Goal(s):  Marland Kitchen Over the next 90 days, patient will work with  PharmD, CPhT, and providers to address needs related to medication access  Interventions: . Collaborated w/ Dr. Nicki Reaper for refills on Breo and Ventolin from [place] assistance. Faxed to [facility]. Will follow up with [place] next week to ensure receipt and processing.    Patient Self Care Activities:  . Self administers medications as prescribed  Please see past updates related to this goal by clicking on the "Past Updates" button in the selected goal          Plan:  - Will outreach [place] next week to ensure they received refill fax  Catie [name], PharmD, [place] Pharmacist [facility] [place] [phone]

## 2019-06-17 NOTE — Progress Notes (Signed)
Pt reports fatigue, but offers no other complaints.

## 2019-06-17 NOTE — Progress Notes (Signed)
Reviewed.  Agree with plan   Dr Roshawnda Pecora 

## 2019-06-17 NOTE — Patient Instructions (Signed)
Visit Information  Goals Addressed            This Visit's Progress     Patient Stated   . "I can't afford my medications" (pt-stated)       Current Barriers:  . Financial Barriers - RESOLVED . APPROVED for Eliquis through 08/19/2019 . APPROVED for Breo/Ventolin through [place] through 08/19/2019 . Patient called me today noting that he has not yet received refills on Breo or Ventolin from [place] patient assistance.   Pharmacist Clinical Goal(s):  Marland Kitchen Over the next 90 days, patient will work with PharmD, CPhT, and providers to address needs related to medication access  Interventions: . Collaborated w/ Dr. Nicki Reaper for refills on Breo and Ventolin from [place] assistance. Faxed to [place]. Will follow up with [place] next week to ensure receipt and processing.    Patient Self Care Activities:  . Self administers medications as prescribed  Please see past updates related to this goal by clicking on the "Past Updates" button in the selected goal         The patient verbalized understanding of instructions provided today and declined a print copy of patient instruction materials.   Plan:  - Will outreach [place] next week to ensure they received refill fax  Catie [name], PharmD, [place] Pharmacist [facility] [place] [phone]

## 2019-06-18 LAB — IGG, IGA, IGM
IgA: 37 mg/dL — ABNORMAL LOW (ref 61–437)
IgG (Immunoglobin G), Serum: 471 mg/dL — ABNORMAL LOW (ref 603–1613)
IgM (Immunoglobulin M), Srm: 3178 mg/dL — ABNORMAL HIGH (ref 15–143)

## 2019-06-18 LAB — PROTEIN ELECTROPHORESIS, SERUM
A/G Ratio: 0.7 (ref 0.7–1.7)
Albumin ELP: 3.4 g/dL (ref 2.9–4.4)
Alpha-1-Globulin: 0.3 g/dL (ref 0.0–0.4)
Alpha-2-Globulin: 0.7 g/dL (ref 0.4–1.0)
Beta Globulin: 1 g/dL (ref 0.7–1.3)
Gamma Globulin: 2.8 g/dL — ABNORMAL HIGH (ref 0.4–1.8)
Globulin, Total: 4.8 g/dL — ABNORMAL HIGH (ref 2.2–3.9)
M-Spike, %: 0.9 g/dL — ABNORMAL HIGH
Total Protein ELP: 8.2 g/dL (ref 6.0–8.5)

## 2019-06-18 LAB — KAPPA/LAMBDA LIGHT CHAINS
Kappa free light chain: 85 mg/L — ABNORMAL HIGH (ref 3.3–19.4)
Kappa, lambda light chain ratio: 5.09 — ABNORMAL HIGH (ref 0.26–1.65)
Lambda free light chains: 16.7 mg/L (ref 5.7–26.3)

## 2019-06-20 ENCOUNTER — Encounter: Payer: Self-pay | Admitting: Internal Medicine

## 2019-06-20 ENCOUNTER — Telehealth: Payer: Self-pay | Admitting: Internal Medicine

## 2019-06-20 DIAGNOSIS — L989 Disorder of the skin and subcutaneous tissue, unspecified: Secondary | ICD-10-CM | POA: Insufficient documentation

## 2019-06-20 DIAGNOSIS — I712 Thoracic aortic aneurysm, without rupture, unspecified: Secondary | ICD-10-CM

## 2019-06-20 NOTE — Assessment & Plan Note (Signed)
States blood pressure has been doing well.  Follow pressures.  Follow metabolic panel.

## 2019-06-20 NOTE — Assessment & Plan Note (Signed)
Followed by hematology.  Has f/u this week.

## 2019-06-20 NOTE — Telephone Encounter (Signed)
Per review of chart, pt had chest CT 04/2018 - incidental finding of an aneurysm (thoracic - chest).  Recommended f/u scan in one year.  Need to schedule.  At this size, all that is recommended is we follow, but does need f/u chest ct.  If agreeable, let me know and I will place order.

## 2019-06-20 NOTE — Assessment & Plan Note (Signed)
Has been worked up extensively.  Has been evaluated by nephrology.  Stable.  Follow.   

## 2019-06-20 NOTE — Assessment & Plan Note (Signed)
Bilateral leg pain.  Refer to pain clinic as outlined.

## 2019-06-20 NOTE — Assessment & Plan Note (Signed)
Recurring scalp lesions.  bactroban helps, but reoccurs.  Plans to f/u with Dr Phillip Heal.

## 2019-06-20 NOTE — Assessment & Plan Note (Signed)
On zoloft.  Stable.  

## 2019-06-20 NOTE — Assessment & Plan Note (Signed)
Has seen Dr Sharlet [place] and pain clinic previously.  With increased leg pain as outlined.  Discussed may be originating from his back.  Request referral back to pain clinic - Dr Holley Raring.  Has been to physical therapy and doing eercises.

## 2019-06-20 NOTE — Assessment & Plan Note (Signed)
Breathing stable.

## 2019-06-20 NOTE — Assessment & Plan Note (Signed)
Followed by hematology 

## 2019-06-20 NOTE — Assessment & Plan Note (Signed)
Discussed again with him today to decrease alcohol intake.

## 2019-06-20 NOTE — Assessment & Plan Note (Signed)
Has a 4cm ascending thoracic aneurysm.  Recommended chest CT f/u in one year.  Last evaluated 04/2018.  Due f/u.

## 2019-06-20 NOTE — Assessment & Plan Note (Signed)
S/p cardioversion.  On eliquis.  Followed by cardiology.  Stable.   

## 2019-06-20 NOTE — Assessment & Plan Note (Signed)
Followed by pulmonary and cardiology.  

## 2019-06-20 NOTE — Assessment & Plan Note (Signed)
Followed by cardiology. Stable.   

## 2019-06-20 NOTE — Assessment & Plan Note (Signed)
Controlled.  

## 2019-06-20 NOTE — Assessment & Plan Note (Signed)
Low cholesterol diet and exercise.  Follow lipid panel.   

## 2019-06-20 NOTE — Assessment & Plan Note (Signed)
Followed by GI.  Bowels stable.  

## 2019-06-23 NOTE — Telephone Encounter (Signed)
Order placed for CT chest.  °

## 2019-06-23 NOTE — Telephone Encounter (Signed)
Patient is agreeable to do f/u ct scan

## 2019-06-23 NOTE — Addendum Note (Signed)
Addended by: Alisa Graff on: 06/23/2019 02:00 PM   Modules accepted: Orders

## 2019-06-24 ENCOUNTER — Inpatient Hospital Stay: Payer: PPO | Attending: Oncology

## 2019-06-24 ENCOUNTER — Other Ambulatory Visit: Payer: Self-pay

## 2019-06-24 VITALS — BP 137/67 | HR 60 | Temp 98.0°F | Resp 18

## 2019-06-24 DIAGNOSIS — L578 Other skin changes due to chronic exposure to nonionizing radiation: Secondary | ICD-10-CM | POA: Diagnosis not present

## 2019-06-24 DIAGNOSIS — D509 Iron deficiency anemia, unspecified: Secondary | ICD-10-CM | POA: Insufficient documentation

## 2019-06-24 DIAGNOSIS — Z79899 Other long term (current) drug therapy: Secondary | ICD-10-CM | POA: Insufficient documentation

## 2019-06-24 DIAGNOSIS — Z85828 Personal history of other malignant neoplasm of skin: Secondary | ICD-10-CM | POA: Diagnosis not present

## 2019-06-24 DIAGNOSIS — Z86018 Personal history of other benign neoplasm: Secondary | ICD-10-CM | POA: Diagnosis not present

## 2019-06-24 DIAGNOSIS — Z872 Personal history of diseases of the skin and subcutaneous tissue: Secondary | ICD-10-CM | POA: Diagnosis not present

## 2019-06-24 DIAGNOSIS — L281 Prurigo nodularis: Secondary | ICD-10-CM | POA: Diagnosis not present

## 2019-06-24 MED ORDER — SODIUM CHLORIDE 0.9 % IV SOLN
INTRAVENOUS | Status: DC
  Administered 2019-06-24: 14:00:00 via INTRAVENOUS
  Filled 2019-06-24: qty 250

## 2019-06-24 MED ORDER — SODIUM CHLORIDE 0.9 % IV SOLN
510.0000 mg | Freq: Once | INTRAVENOUS | Status: AC
  Administered 2019-06-24: 510 mg via INTRAVENOUS
  Filled 2019-06-24: qty 17

## 2019-07-05 ENCOUNTER — Ambulatory Visit (INDEPENDENT_AMBULATORY_CARE_PROVIDER_SITE_OTHER): Payer: PPO | Admitting: Pharmacist

## 2019-07-05 ENCOUNTER — Other Ambulatory Visit: Payer: Self-pay | Admitting: Internal Medicine

## 2019-07-05 DIAGNOSIS — J449 Chronic obstructive pulmonary disease, unspecified: Secondary | ICD-10-CM | POA: Diagnosis not present

## 2019-07-05 NOTE — Telephone Encounter (Signed)
Please confirm with pt if still needs.

## 2019-07-05 NOTE — Patient Instructions (Addendum)
Visit Information  Goals Addressed            This Visit's Progress     Patient Stated   . "I can't afford my medications" (pt-stated)       Current Barriers:  . Financial Barriers - RESOLVED o APPROVED for Eliquis through 08/19/2019 o APPROVED for Breo/Ventolin through [place] through 08/19/2019 o Faxed refill request for Breo/Ventolin to [place] [facility] on 06/17/2019  . Polypharmacy; complex patient with multiple medical conditions including aflutter, CAD/HTN, COPD/interstitial lung disease, MGUS, Waldenstrom macroglobulinemia, iron deficiency anemia; depression, chronic pain o F/u hem/onc on 06/17/2019 Dr. Grayland Ormond; continues Venofer for IDA o COPD: Breo 100/25 BID, Ventolin PRN o CAD/HTN/A flutter: Eliquis 5 mg BID, amlodipine 10 mg daily, metoprolol tartrate 100 mg BID, isosorbide mononitrate 30 mg QAM o Depression: sertraline 50 mg daily o Chronic pain: Gabapentin 400 mg QAM, 400 mg midday, 800 mg QPM, tizanidine PRN; Dr. Nicki Reaper placed referral to f/u with pain management Dr. Holley Raring  Pharmacist Clinical Goal(s):  Marland Kitchen Over the next 90 days, patient will work with PharmD, CPhT, and providers to address needs related to medication access  Interventions: . Contacted [facility]. Refill was received, shipped 11/6, delivered on 11/10. Contacted patient to confirm; he confirmed that he has the medications. Reviewed that as of now, both the inhaler program and Eliquis program require a certain amount to be spent out of pocket before we can reapply in 2021; he verbalized understanding. . Reviewed chart. Referral for pain clinic was denied, noted that patient is already an active patient of Dr. Holley Raring and can call to schedule follow up. Discussed this with patient. He notes that his leg pain has improved since his appointment w/ Dr. Nicki Reaper, and is unsure if he still needs pain clinic f/u. He notes he'll think about it, and will contact Dr. Elwyn Lade office is pain gets worse.    Patient Self Care Activities:  . Self  administers medications as prescribed  Please see past updates related to this goal by clicking on the "Past Updates" button in the selected goal         The patient verbalized understanding of instructions provided today and declined a print copy of patient instruction materials.   Plan:  - Scheduled outreach in ~8 weeks for continued medication management support  Catie [name], PharmD, [place] Pharmacist [name] [phone]

## 2019-07-05 NOTE — Progress Notes (Signed)
Reviewed.  Agree with plan   Dr Dreyton Roessner 

## 2019-07-05 NOTE — Chronic Care Management (AMB) (Addendum)
Chronic Care Management   Follow Up Note   07/05/2019 Name: Troy Lopez MRN: [id number] DOB: 01/02/1941  Referred by: Einar Pheasant, MD Reason for referral : Chronic Care Management (Medication Management)   Troy Lopez is a 78 y.o. year old male who is a primary care patient of Einar Pheasant, MD. The CCM team was consulted for assistance with chronic disease management and care coordination needs.    Care coordination completed today.   Review of patient status, including review of consultants reports, relevant laboratory and other test results, and collaboration with appropriate care team members and the patient's provider was performed as part of comprehensive patient evaluation and provision of chronic care management services.    SDOH (Social Determinants of Health) screening performed today: Financial Strain . See Care Plan for related entries.   Outpatient Encounter Medications as of 07/05/2019  Medication Sig Note   acetaminophen (TYLENOL) 500 MG tablet Take 500-1,000 mg by mouth 3 (three) times daily as needed for moderate pain or headache. 02/22/2019: 2 tablets 3 times daily    albuterol (VENTOLIN HFA) 108 (90 Base) MCG/ACT inhaler Inhale 2 puffs into the lungs every 6 (six) hours as needed for wheezing or shortness of breath.    amLODipine (NORVASC) 10 MG tablet Take 1 tablet (10 mg total) by mouth daily.    azelastine (ASTELIN) 0.1 % nasal spray Place 1 spray into both nostrils 2 (two) times daily. Use in each nostril as directed 02/22/2019: Using PRN   budesonide (ENTOCORT EC) 3 MG 24 hr capsule Take 9 mg by mouth daily as needed (colitis flare).     calcium-vitamin D (OSCAL WITH D) 500-200 MG-UNIT tablet Take 2 tablets by mouth daily.    ELIQUIS 5 MG TABS tablet Take 5 mg by mouth 2 (two) times daily.     FIBER PO Take 1 capsule by mouth daily.     fluticasone (FLONASE) 50 MCG/ACT nasal spray Place 2 sprays into both nostrils daily. (Patient not taking:  Reported on 05/10/2019)    fluticasone furoate-vilanterol (BREO ELLIPTA) 100-25 MCG/INH AEPB INHALE 1 PUFF INTO LUNGS DAILY.    furosemide (LASIX) 20 MG tablet Take 1 tablet (20 mg total) by mouth daily as needed. (Patient taking differently: Take 40 mg by mouth daily. )    gabapentin (NEURONTIN) 100 MG capsule Take 4 capsules (400 mg) daily in the morning and midday. Take 8 capsules (800 mg) daily in the evening. 05/10/2019: Using PRN    isosorbide mononitrate (IMDUR) 30 MG 24 hr tablet Take 1 tablet (30 mg total) by mouth once daily.    metoprolol tartrate (LOPRESSOR) 100 MG tablet Take 1 tablet (100 mg total) by mouth 2 (two) times daily. 05/10/2019: 50 mg BID   mupirocin ointment (BACTROBAN) 2 % Apply to affected area bid. (Patient not taking: Reported on 05/10/2019)    pantoprazole (PROTONIX) 40 MG tablet Take 1 tablet (40 mg total) by mouth 2 (two) times daily.    predniSONE (DELTASONE) 10 MG tablet Take 4 tablets x 1 day and then decrease by 1/2 tablet per day until down to zero mg.    sertraline (ZOLOFT) 50 MG tablet Take 1 tablet (50 mg total) by mouth daily.    tiZANidine (ZANAFLEX) 2 MG tablet  05/10/2019: Taking 1-3 times daily    vitamin B-12 (CYANOCOBALAMIN) 1000 MCG tablet Take 1,000 mcg by mouth daily.     No facility-administered encounter medications on file as of 07/05/2019.      Goals Addressed  This Visit's Progress     Patient Stated    "I can't afford my medications" (pt-stated)       Current Barriers:   Financial Barriers - RESOLVED o APPROVED for Eliquis through 08/19/2019 o APPROVED for Breo/Ventolin through [place] through 08/19/2019 o Faxed refill request for Breo/Ventolin to [place] on 06/17/2019   Polypharmacy; complex patient with multiple medical conditions including aflutter, CAD/HTN, COPD/interstitial lung disease, MGUS, [place]strom macroglobulinemia, iron deficiency anemia; depression, chronic pain o F/u hem/onc on 06/17/2019 Dr. Grayland Ormond;  continues Venofer for IDA o COPD: Breo 100/25 BID, Ventolin PRN o CAD/HTN/A flutter: Eliquis 5 mg BID, amlodipine 10 mg daily, metoprolol tartrate 100 mg BID, isosorbide mononitrate 30 mg QAM o Depression: sertraline 50 mg daily o Chronic pain: Gabapentin 400 mg QAM, 400 mg midday, 800 mg QPM, tizanidine PRN; Dr. Nicki Reaper placed referral to f/u with pain management Dr. Holley Raring  Pharmacist Clinical Goal(s):   Over the next 90 days, patient will work with PharmD, CPhT, and providers to address needs related to medication access  Interventions:  Contacted [facility]. Refill was received, shipped 11/6, delivered on 11/10. Contacted patient to confirm; he confirmed that he has the medications. Reviewed that as of now, both the inhaler program and Eliquis program require a certain amount to be spent out of pocket before we can reapply in 2021; he verbalized understanding.  Reviewed chart. Referral for pain clinic was denied, noted that patient is already an active patient of Dr. Holley Raring and can call to schedule follow up. Discussed this with patient. He notes that his leg pain has improved since his appointment w/ Dr. Nicki Reaper, and is unsure if he still needs pain clinic f/u. He notes he'll think about it, and will contact Dr. Elwyn Lade office is pain gets worse.    Patient Self Care Activities:   Self administers medications as prescribed  Please see past updates related to this goal by clicking on the "Past Updates" button in the selected goal          Plan:  - Scheduled outreach in ~8 weeks for continued medication management support  Catie [name], PharmD, [place] Pharmacist [facility] [place] [phone]

## 2019-07-06 ENCOUNTER — Encounter: Payer: Self-pay | Admitting: Internal Medicine

## 2019-07-06 NOTE — Telephone Encounter (Signed)
Patient uses this for the places on his scalp. He does need refill. Ok to send in?

## 2019-07-07 NOTE — Telephone Encounter (Signed)
See note in son's chart.

## 2019-07-08 ENCOUNTER — Ambulatory Visit
Admission: RE | Admit: 2019-07-08 | Discharge: 2019-07-08 | Disposition: A | Discharge status: Home / Self Care | Payer: PPO | Source: Ambulatory Visit | Attending: Internal Medicine | Admitting: Internal Medicine

## 2019-07-08 ENCOUNTER — Other Ambulatory Visit: Payer: Self-pay

## 2019-07-08 DIAGNOSIS — I712 Thoracic aortic aneurysm, without rupture, unspecified: Secondary | ICD-10-CM

## 2019-07-11 ENCOUNTER — Telehealth: Payer: Self-pay | Admitting: Internal Medicine

## 2019-07-11 ENCOUNTER — Encounter: Payer: Self-pay | Admitting: Internal Medicine

## 2019-07-11 NOTE — Telephone Encounter (Signed)
-----   Message from Katha Cabal, MD sent at 07/11/2019  4:41 PM EST ----- Regarding: RE: question He just needs a CT chest in one year to see if he is growing if not then ct chest every other year.  Unfortunately ultrasound is not useful for chest stuff ----- Message ----- From: Einar Pheasant, MD Sent: 07/10/2019   4:32 PM EST To: Katha Cabal, MD Subject: question                                       Mr Vitale just had a follow up CT chest - to follow up on previously reported thoracic aneurysm.  This CT scan reported thoracic aorta diameter - upper limits of normal.  What follow up would you recommend?  Thank you for your help.    Einar Pheasant

## 2019-07-12 ENCOUNTER — Encounter: Payer: Self-pay | Admitting: Internal Medicine

## 2019-07-21 ENCOUNTER — Encounter: Payer: Self-pay | Admitting: Internal Medicine

## 2019-07-22 NOTE — Telephone Encounter (Signed)
See phone note in sons chart. Rx was sent in from hospital

## 2019-07-23 ENCOUNTER — Other Ambulatory Visit: Payer: Self-pay | Admitting: Internal Medicine

## 2019-07-29 ENCOUNTER — Other Ambulatory Visit: Payer: Self-pay | Admitting: *Deleted

## 2019-07-29 NOTE — Patient Outreach (Signed)
[facility]) Care Management  07/29/2019  Troy Lopez 03-05-1941 [id number]   Case reviewed, no patient outreach needed,  and case closed per Bary Castilla, Assistant Clinical Director at [facility]  request.   [name]. Annia Friendly, BSN, [place] Management [facility] Telephonic CM Phone: [phone] Fax: [phone]

## 2019-08-11 NOTE — Progress Notes (Signed)
Troy Lopez  Telephone:(336) [phone] Fax:(336) [phone]  ID: Troy Lopez OB: 1941-03-30  MR#: [id number]  AST#:[id number]  Patient Care Team: Einar Pheasant, MD as PCP - General (Internal Medicine) Lollie Sails, MD as Consulting Physician (Gastroenterology) Ok Edwards, NP as Nurse Practitioner (Gastroenterology)  CHIEF COMPLAINT: [name]'s macroglobulinemia, iron deficiency anemia.  INTERVAL HISTORY: Patient returns to clinic today for repeat laboratory work and further evaluation.  He feels significantly improved since receiving 2 infusions of 510 mg IV Feraheme approximately 2 months ago. He has no neurologic complaints. He denies any recent fevers or illnesses.  He has a good appetite and denies weight loss.  He denies any chest pain, shortness of breath, cough, or hemoptysis.  He denies any nausea, vomiting, constipation, or diarrhea.  He denies any melena or hematochezia.  He has no urinary complaints.  Patient offers no specific complaints today.  REVIEW OF SYSTEMS:   Review of Systems  Constitutional: Negative.  Negative for fever, malaise/fatigue and weight loss.  Respiratory: Negative.  Negative for cough, hemoptysis and shortness of breath.   Cardiovascular: Negative.  Negative for chest pain and leg swelling.  Gastrointestinal: Negative.  Negative for abdominal pain, blood in stool, diarrhea and melena.  Genitourinary: Negative.  Negative for hematuria.  Musculoskeletal: Negative.  Negative for back pain.  Skin: Negative.  Negative for rash.  Neurological: Negative.  Negative for dizziness, sensory change, focal weakness, weakness and headaches.  Psychiatric/Behavioral: Negative.  The patient is not nervous/anxious.     As per HPI. Otherwise, a complete review of systems is negative.  PAST MEDICAL HISTORY: Past Medical History:  Diagnosis Date  . Adrenal gland anomaly    enlargement  . Anxiety   . CAD (coronary artery  disease)   . Carpal tunnel syndrome   . Chronic hyponatremia   . Colitis   . Colonic polyp   . COPD (chronic obstructive pulmonary disease) ([place])   . Degenerative disc disease, lumbar   . Depression   . Diverticulosis   . Dyspnea   . GERD (gastroesophageal reflux disease)   . H/O degenerative disc disease   . Hypercholesterolemia   . Hyperkalemia   . Hyperlipidemia   . Hypertension   . Irritable bowel syndrome   . Monoclonal gammopathy   . Monoclonal gammopathy   . Neuropathy   . Personal history of tobacco use, presenting hazards to health 08/17/2015  . Sleep apnea   . Vertebral compression fracture ([place])     PAST SURGICAL HISTORY: Past Surgical History:  Procedure Laterality Date  . BUNIONECTOMY  1989  . CARPAL TUNNEL RELEASE Left 2011   ulnar nerve sub muscular at elbow  . CHOLECYSTECTOMY  09/07  . COLONOSCOPY WITH PROPOFOL N/A 03/05/2017   Procedure: COLONOSCOPY WITH PROPOFOL;  Surgeon: Lucilla Lame, MD;  Location: [facility] ENDOSCOPY;  Service: Endoscopy;  Laterality: N/A;  . ELECTROPHYSIOLOGIC STUDY N/A 11/15/2015   Procedure: CARDIOVERSION;  Surgeon: Isaias Cowman, MD;  Location: [facility] ORS;  Service: Cardiovascular;  Laterality: N/A;  . ESOPHAGOGASTRODUODENOSCOPY (EGD) WITH PROPOFOL N/A 03/05/2017   Procedure: ESOPHAGOGASTRODUODENOSCOPY (EGD) WITH PROPOFOL;  Surgeon: Lucilla Lame, MD;  Location: [facility] ENDOSCOPY;  Service: Endoscopy;  Laterality: N/A;  . ESOPHAGOGASTRODUODENOSCOPY (EGD) WITH PROPOFOL N/A 10/17/2017   Procedure: ESOPHAGOGASTRODUODENOSCOPY (EGD) WITH PROPOFOL;  Surgeon: Lollie Sails, MD;  Location[facility] ENDOSCOPY;  Service: Endoscopy;  Laterality: N/A;  . EYE SURGERY Bilateral 2010   cataract  . HEMORRHOID SURGERY    . KYPHOPLASTY N/A 11/04/2016   Procedure:  KYPHOPLASTY T 12;  Surgeon: Hessie Knows, MD;  Location: [facility] ORS;  Service: Orthopedics;  Laterality: N/A;    FAMILY HISTORY Family History  Problem Relation Age of Onset  . Stroke Mother     . Heart disease Father        MI - 84   . Colon cancer Neg Hx   . Prostate cancer Neg Hx        ADVANCED DIRECTIVES:    HEALTH MAINTENANCE: Social History   Tobacco Use  . Smoking status: Former Smoker    Types: Cigarettes    Quit date: 11/01/2015    Years since quitting: 3.7  . Smokeless tobacco: Never Used  . Tobacco comment: about 2 cigarettes per day, trying to quit  Substance Use Topics  . Alcohol use: Yes    Alcohol/week: 42.0 standard drinks    Types: 42 Cans of beer per week    Comment: occas  . Drug use: No     Colonoscopy:  PAP:  Bone density:  Lipid panel:  Allergies  Allergen Reactions  . Iodinated Diagnostic Agents Other (See Comments)    Contraindication secondary to IGMgammopathy/Waldren's syndrome   Not to be given due to Waldenstrom's syndrome, told it could affect kidney function    Current Outpatient Medications  Medication Sig Dispense Refill  . acetaminophen (TYLENOL) 500 MG tablet Take 500-1,000 mg by mouth 3 (three) times daily as needed for moderate pain or headache.    . albuterol (VENTOLIN HFA) 108 (90 Base) MCG/ACT inhaler Inhale 2 puffs into the lungs every 6 (six) hours as needed for wheezing or shortness of breath. 54 g 1  . amLODipine (NORVASC) 10 MG tablet Take 1 tablet (10 mg total) by mouth daily. 90 tablet 1  . azelastine (ASTELIN) 0.1 % nasal spray Place 1 spray into both nostrils 2 (two) times daily. Use in each nostril as directed 30 mL 1  . budesonide (ENTOCORT EC) 3 MG 24 hr capsule Take 9 mg by mouth daily as needed (colitis flare).     . calcium-vitamin D (OSCAL WITH D) 500-200 MG-UNIT tablet Take 2 tablets by mouth daily.    Marland Kitchen ELIQUIS 5 MG TABS tablet Take 5 mg by mouth 2 (two) times daily.     Marland Kitchen FIBER PO Take 1 capsule by mouth daily.     . fluticasone (FLONASE) 50 MCG/ACT nasal spray Place 2 sprays into both nostrils daily. 16 g 0  . fluticasone furoate-vilanterol (BREO ELLIPTA) 100-25 MCG/INH AEPB INHALE 1 PUFF INTO  LUNGS DAILY. 180 each 1  . furosemide (LASIX) 20 MG tablet Take 1 tablet (20 mg total) by mouth daily as needed. (Patient taking differently: Take 20 mg by mouth daily. ) 30 tablet 0  . gabapentin (NEURONTIN) 100 MG capsule Take 4 capsules (400 mg) daily in the morning and midday. Take 8 capsules (800 mg) daily in the evening. (Patient taking differently: 3 (three) times daily. Take 4 capsules (400 mg) daily in the morning and midday. Take 8 capsules (800 mg) daily in the evening.) 480 capsule 0  . isosorbide mononitrate (IMDUR) 30 MG 24 hr tablet Take 1 tablet (30 mg total) by mouth once daily.    . metoprolol tartrate (LOPRESSOR) 100 MG tablet Take 1 tablet (100 mg total) by mouth 2 (two) times daily. 60 tablet 0  . mupirocin ointment (BACTROBAN) 2 % APPLY TO AFFECTED AREAS TWICE A DAY 22 g 0  . pantoprazole (PROTONIX) 40 MG tablet Take 1 tablet (40 mg  total) by mouth 2 (two) times daily. 180 tablet 0  . sertraline (ZOLOFT) 50 MG tablet Take 1 tablet (50 mg total) by mouth daily. 90 tablet 1  . triamcinolone ointment (KENALOG) 0.1 %     . vitamin B-12 (CYANOCOBALAMIN) 1000 MCG tablet Take 1,000 mcg by mouth daily.     . predniSONE (DELTASONE) 10 MG tablet Take 4 tablets x 1 day and then decrease by 1/2 tablet per day until down to zero mg. (Patient not taking: Reported on 08/17/2019) 18 tablet 0  . tiZANidine (ZANAFLEX) 2 MG tablet     . vitamin E 400 UNIT capsule      No current facility-administered medications for this visit.    OBJECTIVE: Vitals:   08/17/19 1308  BP: 117/71  Pulse: 62  Resp: 19  Temp: (!) 97.5 F (36.4 C)  SpO2: 96%     Body mass index is 25.73 kg/m.    ECOG FS:0 - Asymptomatic  General: Well-developed, well-nourished, no acute distress. Eyes: Pink conjunctiva, anicteric sclera. HEENT: Normocephalic, moist mucous membranes. Lungs: No audible wheezing or coughing. Heart: Regular rate and rhythm. Abdomen: Soft, nontender, no obvious distention. Musculoskeletal:  No edema, cyanosis, or clubbing. Neuro: Alert, answering all questions appropriately. Cranial nerves grossly intact. Skin: No rashes or petechiae noted. Psych: Normal affect.   LAB RESULTS:  Lab Results  Component Value Date   NA 131 (L) 08/17/2019   K 4.7 08/17/2019   CL 99 08/17/2019   CO2 23 08/17/2019   GLUCOSE 77 08/17/2019   BUN 16 08/17/2019   CREATININE 1.04 08/17/2019   CALCIUM 9.2 08/17/2019   PROT 8.2 11/16/2018   ALBUMIN 3.6 11/16/2018   AST 16 11/16/2018   ALT 11 11/16/2018   ALKPHOS 71 11/16/2018   BILITOT 0.3 11/16/2018   GFRNONAA >60 08/17/2019   GFRAA >60 08/17/2019    Lab Results  Component Value Date   WBC 6.4 08/17/2019   NEUTROABS 4.2 08/17/2019   HGB 11.4 (L) 08/17/2019   HCT 34.9 (L) 08/17/2019   MCV 107.4 (H) 08/17/2019   PLT 320 08/17/2019   Lab Results  Component Value Date   IRON 60 06/17/2019   TIBC 998 (H) 06/17/2019   IRONPCTSAT 6 (L) 06/17/2019    Lab Results  Component Value Date   FERRITIN 17 (L) 06/17/2019   Lab Results  Component Value Date   TOTALPROTELP 8.2 06/17/2019   ALBUMINELP 3.4 06/17/2019   A1GS 0.3 06/17/2019   A2GS 0.7 06/17/2019   BETS 1.0 06/17/2019   BETA2SER 0.2 02/14/2016   GAMS 2.8 (H) 06/17/2019   MSPIKE 0.9 (H) 06/17/2019   SPEI Comment 06/17/2019    STUDIES: No results found.  ASSESSMENT: [name]'s macroglobulinemia, iron deficiency anemia.  PLAN:    1. [name]'s macroglobulinemia:  Bone marrow biopsy results from Jan 08, 2017 reviewed independently confirming underlying [name]'s.  Patient's M spike has been erratic ranging from 1.1 in May 2018 to as high as 2.6 in October 2018.  His most recent result on June 17, 2019 reported an M spike of 0.9.  His IgM levels remain persistently elevated at 3178.  His kappa/lambda light chain ratio is essentially unchanged at 5.09 with elevated free kappa light chains of 85.0.  He has persistent iron deficiency anemia from a GI source, but no  other evidence of endorgan damage. He does not require any treatment at this time. If he had progression of disease as evidenced by B-symptoms or endorgan damage, will consider Rituxan-based therapy.  Continue to monitor every 3 to 6 months.   2. 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  Patient received 2 infusions of 510 mg IV Feraheme approximately 2 months ago with significant improvement of his hemoglobin to 11.4.  Iron stores are pending at time of dictation.  He does not require additional Feraheme at this time, but likely will need maintenance Feraheme in the future.  Return to clinic in 2 months with repeat laboratory work and further evaluation.   3.  Back pain: Chronic and unchanged.  Continue monitoring and treatment per pain clinic.    Patient expressed understanding and was in agreement with this plan. He also understands that He can call clinic at any time with any questions, concerns, or complaints.    Lloyd Huger, MD   08/17/2019 3:16 PM

## 2019-08-17 ENCOUNTER — Inpatient Hospital Stay: Payer: PPO | Attending: Oncology

## 2019-08-17 ENCOUNTER — Other Ambulatory Visit: Payer: Self-pay

## 2019-08-17 ENCOUNTER — Inpatient Hospital Stay: Payer: PPO

## 2019-08-17 ENCOUNTER — Encounter: Payer: Self-pay | Admitting: Oncology

## 2019-08-17 ENCOUNTER — Inpatient Hospital Stay (HOSPITAL_BASED_OUTPATIENT_CLINIC_OR_DEPARTMENT_OTHER): Payer: PPO | Admitting: Oncology

## 2019-08-17 VITALS — BP 117/71 | HR 62 | Temp 97.5°F | Resp 19 | Wt 184.5 lb

## 2019-08-17 DIAGNOSIS — D509 Iron deficiency anemia, unspecified: Secondary | ICD-10-CM

## 2019-08-17 DIAGNOSIS — Z87891 Personal history of nicotine dependence: Secondary | ICD-10-CM | POA: Diagnosis not present

## 2019-08-17 DIAGNOSIS — C880 Waldenstrom macroglobulinemia: Secondary | ICD-10-CM | POA: Insufficient documentation

## 2019-08-17 DIAGNOSIS — M549 Dorsalgia, unspecified: Secondary | ICD-10-CM | POA: Insufficient documentation

## 2019-08-17 DIAGNOSIS — G8929 Other chronic pain: Secondary | ICD-10-CM | POA: Insufficient documentation

## 2019-08-17 LAB — CBC WITH DIFFERENTIAL/PLATELET
Abs Immature Granulocytes: 0.03 10*3/uL (ref 0.00–0.07)
Basophils Absolute: 0.1 10*3/uL (ref 0.0–0.1)
Basophils Relative: 1 %
Eosinophils Absolute: 0 10*3/uL (ref 0.0–0.5)
Eosinophils Relative: 1 %
HCT: 34.9 % — ABNORMAL LOW (ref 39.0–52.0)
Hemoglobin: 11.4 g/dL — ABNORMAL LOW (ref 13.0–17.0)
Immature Granulocytes: 1 %
Lymphocytes Relative: 23 %
Lymphs Abs: 1.5 10*3/uL (ref 0.7–4.0)
MCH: 35.1 pg — ABNORMAL HIGH (ref 26.0–34.0)
MCHC: 32.7 g/dL (ref 30.0–36.0)
MCV: 107.4 fL — ABNORMAL HIGH (ref 80.0–100.0)
Monocytes Absolute: 0.7 10*3/uL (ref 0.1–1.0)
Monocytes Relative: 10 %
Neutro Abs: 4.2 10*3/uL (ref 1.7–7.7)
Neutrophils Relative %: 64 %
Platelets: 320 10*3/uL (ref 150–400)
RBC: 3.25 MIL/uL — ABNORMAL LOW (ref 4.22–5.81)
RDW: 14.4 % (ref 11.5–15.5)
WBC: 6.4 10*3/uL (ref 4.0–10.5)
nRBC: 0 % (ref 0.0–0.2)

## 2019-08-17 LAB — BASIC METABOLIC PANEL
Anion gap: 9 (ref 5–15)
BUN: 16 mg/dL (ref 8–23)
CO2: 23 mmol/L (ref 22–32)
Calcium: 9.2 mg/dL (ref 8.9–10.3)
Chloride: 99 mmol/L (ref 98–111)
Creatinine, Ser: 1.04 mg/dL (ref 0.61–1.24)
GFR calc Af Amer: >60 mL/min (ref >60)
GFR calc non Af Amer: >60 mL/min (ref >60)
Glucose, Bld: 77 mg/dL (ref 70–99)
Potassium: 4.7 mmol/L (ref 3.5–5.1)
Sodium: 131 mmol/L — ABNORMAL LOW (ref 135–145)

## 2019-08-17 LAB — IRON AND TIBC
Iron: 91 ug/dL (ref 45–182)
Saturation Ratios: 9 % — ABNORMAL LOW (ref 17.9–39.5)
TIBC: 990 ug/dL — ABNORMAL HIGH (ref 250–450)
UIBC: 899 ug/dL

## 2019-08-17 LAB — FERRITIN: Ferritin: 21 ng/mL — ABNORMAL LOW (ref 24–336)

## 2019-08-17 NOTE — Progress Notes (Signed)
Patient prescreened for appointment. Patient has no concerns or questions.  

## 2019-08-18 LAB — KAPPA/LAMBDA LIGHT CHAINS
Kappa free light chain: 76.4 mg/L — ABNORMAL HIGH (ref 3.3–19.4)
Kappa, lambda light chain ratio: 5.74 — ABNORMAL HIGH (ref 0.26–1.65)
Lambda free light chains: 13.3 mg/L (ref 5.7–26.3)

## 2019-08-18 LAB — PROTEIN ELECTROPHORESIS, SERUM
A/G Ratio: 0.8 (ref 0.7–1.7)
Albumin ELP: 3.4 g/dL (ref 2.9–4.4)
Alpha-1-Globulin: 0.2 g/dL (ref 0.0–0.4)
Alpha-2-Globulin: 0.5 g/dL (ref 0.4–1.0)
Beta Globulin: 1.4 g/dL — ABNORMAL HIGH (ref 0.7–1.3)
Gamma Globulin: 2.2 g/dL — ABNORMAL HIGH (ref 0.4–1.8)
Globulin, Total: 4.4 g/dL — ABNORMAL HIGH (ref 2.2–3.9)
M-Spike, %: 2 g/dL — ABNORMAL HIGH
Total Protein ELP: 7.8 g/dL (ref 6.0–8.5)

## 2019-08-18 LAB — IGG, IGA, IGM
IgA: 35 mg/dL — ABNORMAL LOW (ref 61–437)
IgG (Immunoglobin G), Serum: 467 mg/dL — ABNORMAL LOW (ref 603–1613)
IgM (Immunoglobulin M), Srm: 3496 mg/dL — ABNORMAL HIGH (ref 15–143)

## 2019-08-19 ENCOUNTER — Ambulatory Visit (INDEPENDENT_AMBULATORY_CARE_PROVIDER_SITE_OTHER): Payer: PPO | Admitting: Pharmacist

## 2019-08-19 DIAGNOSIS — F320 Major depressive disorder, single episode, mild: Secondary | ICD-10-CM

## 2019-08-19 DIAGNOSIS — J849 Interstitial pulmonary disease, unspecified: Secondary | ICD-10-CM

## 2019-08-19 DIAGNOSIS — J449 Chronic obstructive pulmonary disease, unspecified: Secondary | ICD-10-CM | POA: Diagnosis not present

## 2019-08-19 DIAGNOSIS — I4892 Unspecified atrial flutter: Secondary | ICD-10-CM

## 2019-08-19 DIAGNOSIS — F32A Depression, unspecified: Secondary | ICD-10-CM

## 2019-08-19 MED ORDER — BREO ELLIPTA 100-25 MCG/INH IN AEPB: INHALATION_SPRAY | RESPIRATORY_TRACT | 1 refills | Status: DC

## 2019-08-19 MED ORDER — ALBUTEROL SULFATE HFA 108 (90 BASE) MCG/ACT IN AERS: 2.0000 | INHALATION_SPRAY | Freq: Four times a day (QID) | RESPIRATORY_TRACT | 3 refills | Status: DC | PRN

## 2019-08-19 NOTE — Patient Instructions (Signed)
Visit Information  Goals Addressed            This Visit's Progress     Patient Stated   . PharmD "I want to stay healthy (pt-stated)       Current Barriers:  . Financial Barriers - receiving Eliquis and Breo/Ventolin through patient assistance programs, though enrollment expires today. Discussed that he will need to get these medications through his insurance.  o Patient unaware that copay for 90 day supply is $90, as opposed to $45 per month if he gets monthly. o Discussed that both companies require the patient to spend a certain amount of out pocket on copays prior to be eligible. However, [employer] program through [employer] does NOT require a certain out of pocket spend. Patient interested in trying for this program and seeing how [facility] controls his breathing.  . Polypharmacy; complex patient with multiple medical conditions including aflutter, CAD/HTN, COPD/interstitial lung disease, MGUS, Waldenstrom macroglobulinemia, iron deficiency anemia; depression, chronic pain o F/u hem/onc on 08/16/2019 w/ Dr. Grayland Ormond; continues Venofer for IDA o COPD: Breo 100/25 BID, Ventolin PRN; requested 90 day supplies be called in today. Planning on submitting application for [employer] assistance for Dulera/Proventil to replace [place] Breo/Ventolin products.  o CAD/HTN/A flutter: Eliquis 5 mg BID, amlodipine 10 mg daily, metoprolol tartrate 100 mg BID, isosorbide mononitrate 30 mg QAM; patient notes he has f/u with Dr. Saralyn Pilar on 09/27/19 o Depression: sertraline 50 mg daily; heavy stress lately given health complications with his son. Notes that he and his wife "almost went crazy" trying to coordinate care w/ his son in a SNF, but that the son is now home and has someone staying with him around the clock. Patient notes that the care has been costly, but worth it.  o Chronic pain: Gabapentin 400 mg QAM, 400 mg midday, 400 mg QPM  Pharmacist Clinical Goal(s):  Marland Kitchen Over the next 90 days, patient will work with PharmD,  CPhT, and providers to address needs related to medication access  Interventions: . Comprehensive medication review performed, medication list updated in electronic medical record.  . 90 day supply sent for Breo and Ventolin to aid in medication affordability.  . Discussed eligibility requirements for Dulera/Proventil through [employer] patient assistance. Patient interested in applying for this program. Will collaborate w/ Susy Frizzle, CPhT to mail patient his portion of the application, will collaborate w/ Dr. Nicki Reaper for her signatures. Patient aware of the lengthy time of the application process, so will fill Breo in the interim and continue until he receives [facility], if approved.  Marland Kitchen He will request an Eliquis prescription for a 90 day supply with Dr. Saralyn Pilar.  . Provided empathetic listening while patient discussed the caregiver burden on he and his wife in regards to his son's care.    Patient Self Care Activities:  . Self administers medications as prescribed  Please see past updates related to this goal by clicking on the "Past Updates" button in the selected goal         The patient verbalized understanding of instructions provided today and declined a print copy of patient instruction materials.   Plan: - Will collaborate w/ patient, provider, and CPhT as above - Scheduled f/u outreach 10/18/19 at 9 am.   Courtney Heys, PharmD, Simren Popson, [place] Pharmacist [place] [phone]

## 2019-08-19 NOTE — Chronic Care Management (AMB) (Signed)
Chronic Care Management   Follow Up Note   08/19/2019 Name: Troy Lopez MRN: [id number] DOB: 09/16/1940  Referred by: Einar Pheasant, MD Reason for referral : Chronic Care Management (Medication Management)   Troy Lopez is a 78 y.o. year old male who is a primary care patient of Einar Pheasant, MD. The CCM team was consulted for assistance with chronic disease management and care coordination needs.    Contacted patient for medication management review.   Review of patient status, including review of consultants reports, relevant laboratory and other test results, and collaboration with appropriate care team members and the patient's provider was performed as part of comprehensive patient evaluation and provision of chronic care management services.    SDOH (Social Determinants of Health) screening performed today: Financial Strain  Stress. See Care Plan for related entries.   Outpatient Encounter Medications as of 08/19/2019  Medication Sig Note  . acetaminophen (TYLENOL) 500 MG tablet Take 500-1,000 mg by mouth 3 (three) times daily as needed for moderate pain or headache. 02/22/2019: 2 tablets 3 times daily   . albuterol (VENTOLIN HFA) 108 (90 Base) MCG/ACT inhaler Inhale 2 puffs into the lungs every 6 (six) hours as needed for wheezing or shortness of breath.   Marland Kitchen amLODipine (NORVASC) 10 MG tablet Take 1 tablet (10 mg total) by mouth daily.   Marland Kitchen azelastine (ASTELIN) 0.1 % nasal spray Place 1 spray into both nostrils 2 (two) times daily. Use in each nostril as directed 02/22/2019: Using PRN  . calcium-vitamin D (OSCAL WITH D) 500-200 MG-UNIT tablet Take 2 tablets by mouth daily.   Marland Kitchen ELIQUIS 5 MG TABS tablet Take 5 mg by mouth 2 (two) times daily.    Marland Kitchen FIBER PO Take 1 capsule by mouth daily.    . fluticasone furoate-vilanterol (BREO ELLIPTA) 100-25 MCG/INH AEPB INHALE 1 PUFF INTO LUNGS DAILY.   . furosemide (LASIX) 20 MG tablet Take 1 tablet (20 mg total) by mouth daily as  needed. (Patient taking differently: Take 20 mg by mouth daily. )   . gabapentin (NEURONTIN) 100 MG capsule Take 4 capsules (400 mg) daily in the morning and midday. Take 8 capsules (800 mg) daily in the evening. (Patient taking differently: 3 (three) times daily. Take 4 capsules (400 mg) daily in the morning and midday. Take 8 capsules (800 mg) daily in the evening.) 08/19/2019: Taking 400 mg TID most of the time, occasional extra doses in evening   . isosorbide mononitrate (IMDUR) 30 MG 24 hr tablet Take 1 tablet (30 mg total) by mouth once daily.   . metoprolol tartrate (LOPRESSOR) 100 MG tablet Take 1 tablet (100 mg total) by mouth 2 (two) times daily.   . pantoprazole (PROTONIX) 40 MG tablet Take 1 tablet (40 mg total) by mouth 2 (two) times daily.   . sertraline (ZOLOFT) 50 MG tablet Take 1 tablet (50 mg total) by mouth daily.   Marland Kitchen triamcinolone ointment (KENALOG) 0.1 %    . vitamin B-12 (CYANOCOBALAMIN) 1000 MCG tablet Take 1,000 mcg by mouth daily.    . [DISCONTINUED] mupirocin ointment (BACTROBAN) 2 % APPLY TO AFFECTED AREAS TWICE A DAY 08/19/2019: PRN  . [DISCONTINUED] tiZANidine (ZANAFLEX) 2 MG tablet  05/10/2019: Taking 1-3 times daily   . budesonide (ENTOCORT EC) 3 MG 24 hr capsule Take 9 mg by mouth daily as needed (colitis flare).    . [DISCONTINUED] fluticasone (FLONASE) 50 MCG/ACT nasal spray Place 2 sprays into both nostrils daily.   . [DISCONTINUED] predniSONE (  DELTASONE) 10 MG tablet Take 4 tablets x 1 day and then decrease by 1/2 tablet per day until down to zero mg. (Patient not taking: Reported on 08/17/2019)   . [DISCONTINUED] vitamin E 400 UNIT capsule     No facility-administered encounter medications on file as of 08/19/2019.     Goals Addressed            This Visit's Progress     Patient Stated   . PharmD "I want to stay healthy (pt-stated)       Current Barriers:  . Financial Barriers - receiving Eliquis and Breo/Ventolin through patient assistance programs,  though enrollment expires today. Discussed that he will need to get these medications through his insurance.  o Patient unaware that copay for 90 day supply is $90, as opposed to $45 per month if he gets monthly. o Discussed that both companies require the patient to spend a certain amount of out pocket on copays prior to be eligible. However, [employer] program through [employer] does NOT require a certain out of pocket spend. Patient interested in trying for this program and seeing how [facility] controls his breathing.  . Polypharmacy; complex patient with multiple medical conditions including aflutter, CAD/HTN, COPD/interstitial lung disease, MGUS, Waldenstrom macroglobulinemia, iron deficiency anemia; depression, chronic pain o F/u hem/onc on 08/16/2019 w/ Dr. Grayland Ormond; continues Venofer for IDA o COPD: Breo 100/25 BID, Ventolin PRN; requested 90 day supplies be called in today. Planning on submitting application for [employer] assistance for Dulera/Proventil to replace [place] Breo/Ventolin products.  o CAD/HTN/A flutter: Eliquis 5 mg BID, amlodipine 10 mg daily, metoprolol tartrate 100 mg BID, isosorbide mononitrate 30 mg QAM; patient notes he has f/u with Dr. Saralyn Pilar on 09/27/19 o Depression: sertraline 50 mg daily; heavy stress lately given health complications with his son. Notes that he and his wife "almost went crazy" trying to coordinate care w/ his son in a SNF, but that the son is now home and has someone staying with him around the clock. Patient notes that the care has been costly, but worth it.  o Chronic pain: Gabapentin 400 mg QAM, 400 mg midday, 400 mg QPM  Pharmacist Clinical Goal(s):  Marland Kitchen Over the next 90 days, patient will work with PharmD, CPhT, and providers to address needs related to medication access  Interventions: . Comprehensive medication review performed, medication list updated in electronic medical record.  . 90 day supply sent for Breo and Ventolin to aid in medication affordability.   . Discussed eligibility requirements for Dulera/Proventil through [employer] patient assistance. Patient interested in applying for this program. Will collaborate w/ Susy Frizzle, CPhT to mail patient his portion of the application, will collaborate w/ Dr. Nicki Reaper for her signatures. Patient aware of the lengthy time of the application process, so will fill Breo in the interim and continue until he receives [facility], if approved.  Marland Kitchen He will request an Eliquis prescription for a 90 day supply with Dr. Saralyn Pilar.  . Provided empathetic listening while patient discussed the caregiver burden on he and his wife in regards to his son's care.    Patient Self Care Activities:  . Self administers medications as prescribed  Please see past updates related to this goal by clicking on the "Past Updates" button in the selected goal          Plan: - Will collaborate w/ patient, provider, and CPhT as above - Scheduled f/u outreach 10/18/19 at 9 am.   Courtney Heys, PharmD, [place], [place] Clinical Pharmacist [place]  [place] [phone]

## 2019-08-22 NOTE — Progress Notes (Signed)
Reviewed above information.  Agree with assessment and plan.    Dr Kenzli Barritt 

## 2019-08-23 ENCOUNTER — Telehealth: Payer: PPO

## 2019-08-25 ENCOUNTER — Other Ambulatory Visit: Payer: Self-pay | Admitting: Pharmacy Technician

## 2019-08-25 NOTE — Patient Outreach (Signed)
[facility]) Care Management  08/25/2019  Troy Lopez 04-05-1941 [id number]                                      Medication Assistance Referral  Referral From: [facility] Embedded RPh Catie T.   Medication/Company: [employer] / Ruthe Mannan and Proventil Patient application portion:  Mailed  [id number] Provider application portion:  N/A Embedded RPh to have signed while in clinic to Dr. Nicki Reaper Provider address/fax verified via: Office website    Follow up:  Will follow up with patient in 5-15 business days to confirm application(s) have been received.  Nimrit Kehres P. Clemon Devaul, [place] Management [phone]

## 2019-08-31 ENCOUNTER — Telehealth: Payer: Self-pay | Admitting: *Deleted

## 2019-08-31 NOTE — Telephone Encounter (Signed)
Patient called stating that he feels he needs an iron infusion due to how he is feeling, weak fatiqued. He doe snot have a follow up appointment until 10/19/19. Please advise

## 2019-08-31 NOTE — Telephone Encounter (Signed)
He can come if for labs and iron infusion if needed in the next 1-2 weeks.

## 2019-08-31 NOTE — Telephone Encounter (Signed)
Schedule message sent and patient informed to expect a call with appointment

## 2019-09-01 NOTE — Telephone Encounter (Signed)
Pt has been scheduled for 1/19.   [place]

## 2019-09-06 ENCOUNTER — Other Ambulatory Visit: Payer: Self-pay | Admitting: Internal Medicine

## 2019-09-07 ENCOUNTER — Inpatient Hospital Stay: Payer: PPO | Attending: Oncology

## 2019-09-07 ENCOUNTER — Other Ambulatory Visit: Payer: Self-pay

## 2019-09-07 ENCOUNTER — Inpatient Hospital Stay: Payer: PPO

## 2019-09-07 VITALS — BP 115/62 | HR 63 | Temp 97.5°F | Resp 18

## 2019-09-07 DIAGNOSIS — D509 Iron deficiency anemia, unspecified: Secondary | ICD-10-CM | POA: Diagnosis not present

## 2019-09-07 DIAGNOSIS — C880 Waldenstrom macroglobulinemia: Secondary | ICD-10-CM

## 2019-09-07 DIAGNOSIS — Z79899 Other long term (current) drug therapy: Secondary | ICD-10-CM | POA: Diagnosis not present

## 2019-09-07 LAB — BASIC METABOLIC PANEL
Anion gap: 10 (ref 5–15)
BUN: 16 mg/dL (ref 8–23)
CO2: 21 mmol/L — ABNORMAL LOW (ref 22–32)
Calcium: 9.3 mg/dL (ref 8.9–10.3)
Chloride: 97 mmol/L — ABNORMAL LOW (ref 98–111)
Creatinine, Ser: 1.12 mg/dL (ref 0.61–1.24)
GFR calc Af Amer: >60 mL/min (ref >60)
GFR calc non Af Amer: >60 mL/min (ref >60)
Glucose, Bld: 85 mg/dL (ref 70–99)
Potassium: 4.7 mmol/L (ref 3.5–5.1)
Sodium: 128 mmol/L — ABNORMAL LOW (ref 135–145)

## 2019-09-07 LAB — FERRITIN: Ferritin: 20 ng/mL — ABNORMAL LOW (ref 24–336)

## 2019-09-07 LAB — CBC WITH DIFFERENTIAL/PLATELET
Abs Immature Granulocytes: 0.02 10*3/uL (ref 0.00–0.07)
Basophils Absolute: 0.1 10*3/uL (ref 0.0–0.1)
Basophils Relative: 1 %
Eosinophils Absolute: 0 10*3/uL (ref 0.0–0.5)
Eosinophils Relative: 0 %
HCT: 36.5 % — ABNORMAL LOW (ref 39.0–52.0)
Hemoglobin: 11.6 g/dL — ABNORMAL LOW (ref 13.0–17.0)
Immature Granulocytes: 0 %
Lymphocytes Relative: 20 %
Lymphs Abs: 1.3 10*3/uL (ref 0.7–4.0)
MCH: 33.8 pg (ref 26.0–34.0)
MCHC: 31.8 g/dL (ref 30.0–36.0)
MCV: 106.4 fL — ABNORMAL HIGH (ref 80.0–100.0)
Monocytes Absolute: 0.7 10*3/uL (ref 0.1–1.0)
Monocytes Relative: 10 %
Neutro Abs: 4.8 10*3/uL (ref 1.7–7.7)
Neutrophils Relative %: 69 %
Platelets: 193 10*3/uL (ref 150–400)
RBC: 3.43 MIL/uL — ABNORMAL LOW (ref 4.22–5.81)
RDW: 13.8 % (ref 11.5–15.5)
WBC: 6.8 10*3/uL (ref 4.0–10.5)
nRBC: 0 % (ref 0.0–0.2)

## 2019-09-07 LAB — IRON AND TIBC
Iron: 105 ug/dL (ref 45–182)
Saturation Ratios: 10 % — ABNORMAL LOW (ref 17.9–39.5)
TIBC: 1089 ug/dL — ABNORMAL HIGH (ref 250–450)
UIBC: 984 ug/dL

## 2019-09-07 MED ORDER — SODIUM CHLORIDE 0.9 % IV SOLN
INTRAVENOUS | Status: DC
  Filled 2019-09-07: qty 250

## 2019-09-07 MED ORDER — SODIUM CHLORIDE 0.9 % IV SOLN
510.0000 mg | Freq: Once | INTRAVENOUS | Status: AC
  Administered 2019-09-07: 510 mg via INTRAVENOUS
  Filled 2019-09-07: qty 510

## 2019-09-08 ENCOUNTER — Other Ambulatory Visit: Payer: Self-pay | Admitting: Pharmacy Technician

## 2019-09-08 LAB — PROTEIN ELECTROPHORESIS, SERUM
A/G Ratio: 0.7 (ref 0.7–1.7)
Albumin ELP: 3.5 g/dL (ref 2.9–4.4)
Alpha-1-Globulin: 0.2 g/dL (ref 0.0–0.4)
Alpha-2-Globulin: 0.6 g/dL (ref 0.4–1.0)
Beta Globulin: 1.1 g/dL (ref 0.7–1.3)
Gamma Globulin: 3.1 g/dL — ABNORMAL HIGH (ref 0.4–1.8)
Globulin, Total: 5 g/dL — ABNORMAL HIGH (ref 2.2–3.9)
M-Spike, %: 0.5 g/dL — ABNORMAL HIGH
Total Protein ELP: 8.5 g/dL (ref 6.0–8.5)

## 2019-09-08 LAB — IGG, IGA, IGM
IgA: 38 mg/dL — ABNORMAL LOW (ref 61–437)
IgG (Immunoglobin G), Serum: 503 mg/dL — ABNORMAL LOW (ref 603–1613)
IgM (Immunoglobulin M), Srm: 3604 mg/dL — ABNORMAL HIGH (ref 15–143)

## 2019-09-08 LAB — KAPPA/LAMBDA LIGHT CHAINS
Kappa free light chain: 83.5 mg/L — ABNORMAL HIGH (ref 3.3–19.4)
Kappa, lambda light chain ratio: 4.8 — ABNORMAL HIGH (ref 0.26–1.65)
Lambda free light chains: 17.4 mg/L (ref 5.7–26.3)

## 2019-09-08 NOTE — Patient Outreach (Signed)
[facility] [facility]) Care Management  09/08/2019  Troy Lopez 1940-12-10 [id number]     Successful call placed to patient regarding patient assistance application(s) for [facility] and Proventil with [employer] , HIPAA identifiers verified.   Patient informed he received the application and mailed it back yesterday.  Follow up:  Will route note to embedded [facility] RPh Catie [name] for case closure if document(s) have not been received in the next 15 business days.  [name] P. Raziah Funnell, [place] Management [phone]

## 2019-09-16 ENCOUNTER — Other Ambulatory Visit: Payer: Self-pay | Admitting: Pharmacy Technician

## 2019-09-16 NOTE — Patient Outreach (Signed)
[facility]) Care Management  09/16/2019  Troy Lopez 09-08-1940 [id number]    Received both patient and provider portion(s) of patient assistance application(s) for [facility] and Proventil. Mailed completed application and required documents into [employer].  Will follow up with company(ies) in 14-21 business days to check status of application(s).  [name] P. Keontre Defino, [place] Management [phone]

## 2019-09-25 ENCOUNTER — Other Ambulatory Visit: Payer: Self-pay | Admitting: Internal Medicine

## 2019-09-27 DIAGNOSIS — G4733 Obstructive sleep apnea (adult) (pediatric): Secondary | ICD-10-CM | POA: Diagnosis not present

## 2019-09-27 DIAGNOSIS — E7800 Pure hypercholesterolemia, unspecified: Secondary | ICD-10-CM | POA: Diagnosis not present

## 2019-09-27 DIAGNOSIS — I2510 Atherosclerotic heart disease of native coronary artery without angina pectoris: Secondary | ICD-10-CM | POA: Diagnosis not present

## 2019-09-27 DIAGNOSIS — I10 Essential (primary) hypertension: Secondary | ICD-10-CM | POA: Diagnosis not present

## 2019-09-28 ENCOUNTER — Other Ambulatory Visit: Payer: Self-pay

## 2019-09-28 ENCOUNTER — Ambulatory Visit (INDEPENDENT_AMBULATORY_CARE_PROVIDER_SITE_OTHER): Payer: PPO | Admitting: Internal Medicine

## 2019-09-28 VITALS — BP 117/61 | Ht 70.0 in | Wt 184.0 lb

## 2019-09-28 DIAGNOSIS — I4892 Unspecified atrial flutter: Secondary | ICD-10-CM | POA: Diagnosis not present

## 2019-09-28 DIAGNOSIS — J441 Chronic obstructive pulmonary disease with (acute) exacerbation: Secondary | ICD-10-CM

## 2019-09-28 DIAGNOSIS — D509 Iron deficiency anemia, unspecified: Secondary | ICD-10-CM | POA: Diagnosis not present

## 2019-09-28 DIAGNOSIS — R229 Localized swelling, mass and lump, unspecified: Secondary | ICD-10-CM | POA: Diagnosis not present

## 2019-09-28 DIAGNOSIS — E871 Hypo-osmolality and hyponatremia: Secondary | ICD-10-CM | POA: Diagnosis not present

## 2019-09-28 DIAGNOSIS — I10 Essential (primary) hypertension: Secondary | ICD-10-CM

## 2019-09-28 DIAGNOSIS — F32A Depression, unspecified: Secondary | ICD-10-CM

## 2019-09-28 DIAGNOSIS — K219 Gastro-esophageal reflux disease without esophagitis: Secondary | ICD-10-CM

## 2019-09-28 DIAGNOSIS — I272 Pulmonary hypertension, unspecified: Secondary | ICD-10-CM

## 2019-09-28 DIAGNOSIS — J449 Chronic obstructive pulmonary disease, unspecified: Secondary | ICD-10-CM

## 2019-09-28 DIAGNOSIS — I2510 Atherosclerotic heart disease of native coronary artery without angina pectoris: Secondary | ICD-10-CM | POA: Diagnosis not present

## 2019-09-28 DIAGNOSIS — C880 Waldenstrom macroglobulinemia: Secondary | ICD-10-CM

## 2019-09-28 DIAGNOSIS — F320 Major depressive disorder, single episode, mild: Secondary | ICD-10-CM | POA: Diagnosis not present

## 2019-09-28 DIAGNOSIS — R0602 Shortness of breath: Secondary | ICD-10-CM | POA: Diagnosis not present

## 2019-09-28 NOTE — Progress Notes (Signed)
Patient ID: Troy Lopez, male   DOB: 1940/11/22, 79 y.o.   MRN: [id number]   Virtual Visit via video Note  This visit type was conducted due to national recommendations for restrictions regarding the COVID-19 pandemic (e.g. social distancing).  This format is felt to be most appropriate for this patient at this time.  All issues noted in this document were discussed and addressed.  No physical exam was performed (except for noted visual exam findings with Video Visits).   I connected with Troy Lopez by a video enabled telemedicine application and verified that I am speaking with the correct person using two identifiers. Location patient: home Location provider: work Persons participating in the virtual visit: patient, provider  The limitations, risks, security and privacy concerns of performing an evaluation and management service by video and the availability of in person appointments have been discussed. The patient expressed understanding and agreed to proceed.  Reason for visit: scheduled follow up.   HPI: Just saw cardiology yesterday.  Commented on chronic sob.  Felt things were stable.  Recommended f/u in 4 months. No changes made.  Is off lasix.  Stopped several weeks ago.  He noticed when he stood up, felt like he was going to pass out.  Stopped medication.  No further episodes.  Blood pressure 116/61.  Has noticed being more sob recently.  States has been present for several weeks.  Some cough.  No chest congestion.  No fever.  Eating. No vomiting or abdominal pain reported.  Has noticed a bony protuberance on his knee.  Persistent. Present for 2 months.  No known injury or trauma.  No redness.     ROS: See pertinent positives and negatives per HPI.  Past Medical History:  Diagnosis Date  . Adrenal gland anomaly    enlargement  . Anxiety   . CAD (coronary artery disease)   . Carpal tunnel syndrome   . Chronic hyponatremia   . Colitis   . Colonic polyp   . COPD (chronic  obstructive pulmonary disease) ([place])   . Degenerative disc disease, lumbar   . Depression   . Diverticulosis   . Dyspnea   . GERD (gastroesophageal reflux disease)   . H/O degenerative disc disease   . Hypercholesterolemia   . Hyperkalemia   . Hyperlipidemia   . Hypertension   . Irritable bowel syndrome   . Monoclonal gammopathy   . Monoclonal gammopathy   . Neuropathy   . Personal history of tobacco use, presenting hazards to health 08/17/2015  . Sleep apnea   . Vertebral compression fracture [facility])     Past Surgical History:  Procedure Laterality Date  . BUNIONECTOMY  1989  . CARPAL TUNNEL RELEASE Left 2011   ulnar nerve sub muscular at elbow  . CHOLECYSTECTOMY  09/07  . COLONOSCOPY WITH PROPOFOL N/A 03/05/2017   Procedure: COLONOSCOPY WITH PROPOFOL;  Surgeon: Lucilla Lame, MD;  Location: [facility] ENDOSCOPY;  Service: Endoscopy;  Laterality: N/A;  . ELECTROPHYSIOLOGIC STUDY N/A 11/15/2015   Procedure: CARDIOVERSION;  Surgeon: Isaias Cowman, MD;  Location: [facility] ORS;  Service: Cardiovascular;  Laterality: N/A;  . ESOPHAGOGASTRODUODENOSCOPY (EGD) WITH PROPOFOL N/A 03/05/2017   Procedure: ESOPHAGOGASTRODUODENOSCOPY (EGD) WITH PROPOFOL;  Surgeon: Lucilla Lame, MD;  Location: [facility] ENDOSCOPY;  Service: Endoscopy;  Laterality: N/A;  . ESOPHAGOGASTRODUODENOSCOPY (EGD) WITH PROPOFOL N/A 10/17/2017   Procedure: ESOPHAGOGASTRODUODENOSCOPY (EGD) WITH PROPOFOL;  Surgeon: Lollie Sails, MD;  Location: [facility] ENDOSCOPY;  Service: Endoscopy;  Laterality: N/A;  . EYE SURGERY Bilateral 2010  cataract  . HEMORRHOID SURGERY    . KYPHOPLASTY N/A 11/04/2016   Procedure: KYPHOPLASTY T 12;  Surgeon: Hessie Knows, MD;  Location: [facility] ORS;  Service: Orthopedics;  Laterality: N/A;    Family History  Problem Relation Age of Onset  . Stroke Mother   . Heart disease Father        MI - 73   . Colon cancer Neg Hx   . Prostate cancer Neg Hx     SOCIAL HX: reviewed.    Current Outpatient  Medications:  .  acetaminophen (TYLENOL) 500 MG tablet, Take 500-1,000 mg by mouth 3 (three) times daily as needed for moderate pain or headache., Disp: , Rfl:  .  albuterol (VENTOLIN HFA) 108 (90 Base) MCG/ACT inhaler, Inhale 2 puffs into the lungs every 6 (six) hours as needed for wheezing or shortness of breath., Disp: 54 g, Rfl: 3 .  amLODipine (NORVASC) 10 MG tablet, Take 1 tablet (10 mg total) by mouth daily., Disp: 90 tablet, Rfl: 1 .  azelastine (ASTELIN) 0.1 % nasal spray, Place 1 spray into both nostrils 2 (two) times daily. Use in each nostril as directed, Disp: 30 mL, Rfl: 1 .  budesonide (ENTOCORT EC) 3 MG 24 hr capsule, Take 9 mg by mouth daily as needed (colitis flare). , Disp: , Rfl:  .  calcium-vitamin D (OSCAL WITH D) 500-200 MG-UNIT tablet, Take 2 tablets by mouth daily., Disp: , Rfl:  .  ELIQUIS 5 MG TABS tablet, Take 5 mg by mouth 2 (two) times daily. , Disp: , Rfl:  .  FIBER PO, Take 1 capsule by mouth daily. , Disp: , Rfl:  .  fluticasone furoate-vilanterol (BREO ELLIPTA) 100-25 MCG/INH AEPB, INHALE 1 PUFF INTO LUNGS DAILY., Disp: 180 each, Rfl: 1 .  furosemide (LASIX) 20 MG tablet, Take 1 tablet (20 mg total) by mouth daily as needed. (Patient taking differently: Take 20 mg by mouth daily. ), Disp: 30 tablet, Rfl: 0 .  gabapentin (NEURONTIN) 100 MG capsule, Take 4 capsules (400 mg) daily in the morning and midday. Take 8 capsules (800 mg) daily in the evening., Disp: 480 capsule, Rfl: 0 .  isosorbide mononitrate (IMDUR) 30 MG 24 hr tablet, Take 1 tablet (30 mg total) by mouth once daily., Disp: , Rfl:  .  metoprolol tartrate (LOPRESSOR) 100 MG tablet, Take 1 tablet (100 mg total) by mouth 2 (two) times daily., Disp: 60 tablet, Rfl: 0 .  pantoprazole (PROTONIX) 40 MG tablet, Take 1 tablet (40 mg total) by mouth 2 (two) times daily., Disp: 180 tablet, Rfl: 0 .  predniSONE (DELTASONE) 10 MG tablet, Take 4 tablets x 1 day and then decrease by 1/2 tablet per day until down to zero  mg., Disp: 18 tablet, Rfl: 0 .  sertraline (ZOLOFT) 50 MG tablet, Take 1 tablet (50 mg total) by mouth daily., Disp: 90 tablet, Rfl: 1 .  triamcinolone ointment (KENALOG) 0.1 %, , Disp: , Rfl:  .  vitamin B-12 (CYANOCOBALAMIN) 1000 MCG tablet, Take 1,000 mcg by mouth daily. , Disp: , Rfl:   EXAM:  VITALS per patient if applicable:  [id number]  GENERAL: alert, oriented, appears well and in no acute distress  HEENT: atraumatic, conjunttiva clear, no obvious abnormalities on inspection of external nose and ears  NECK: normal movements of the head and neck  LUNGS: on inspection no signs of respiratory distress, breathing rate appears normal, no obvious gross SOB, gasping or wheezing  CV: no obvious cyanosis  MS: bony protuberance -  right knee.  No increased erythema.   PSYCH/NEURO: pleasant and cooperative, no obvious depression or anxiety, speech and thought processing grossly intact  ASSESSMENT AND PLAN:  Discussed the following assessment and plan:  Atrial flutter ([place]) S/p cardioversion.  On eliquis.  Followed by cardiology.  Stable.    CAD (coronary artery disease) Followed by cardiology.  Just evaluated.  Felt stable.  Continue risk factor modification.   COPD exacerbation ([place]) SOB as outlined.  Could be copd flare.  Discussed possible covid.  He denies known exposure.  Declines to be tested.  Check cxr.  Mucinex.  Discussed possible need for prednisone taper.   GERD (gastroesophageal reflux disease) Controlled on current medication regimen.   Hypertension Blood pressure as outlined.  Off lasix.  Follow pressures.  Follow metabolic panel.   Hyponatremia Has been worked up extensively.  Has been evaluated by nephrology.  Stable.  Follow.   Iron deficiency anemia Followed by hematology.    Mild depression ([place]) On zoloft.  Stable.   Moderate COPD (chronic obstructive pulmonary disease) ([facility]) Check cxr as outlined.  Mucinex.  May need prednisone taper.  Follow.    Pulmonary hypertension ([place]) Followed by pulmonary and cardiology.   SOB (shortness of breath) SOB as outlined.  Just saw cardiology yesterday.  Felt stable.  Question if copd flare.  Hold abx.  Mucinex.  Check cxr.  May need prednisone taper. Follow.    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    Skin nodule Bony protuberance - knee as outlined.  Check xray.    Orders Placed This Encounter  Procedures  . DG Chest 2 View    Standing Status:   Future    Number of Occurrences:   1    Standing Expiration Date:   11/25/2020    Order Specific Question:   Reason for Exam (SYMPTOM  OR DIAGNOSIS REQUIRED)    Answer:   shortness of breath.  gradual worsening - over several weeks.    Order Specific Question:   Preferred imaging location?    Answer:   [facility]    Order Specific Question:   Radiology Contrast Protocol - do NOT remove file path    Answer:   \\[email or web address]\[email or web address]\Radiant\DXFluoroContrastProtocols.pdf  . DG Knee 1-2 Views Right    Standing Status:   Future    Number of Occurrences:   1    Standing Expiration Date:   11/25/2020    Order Specific Question:   Reason for Exam (SYMPTOM  OR DIAGNOSIS REQUIRED)    Answer:   right knee - nodule/bone protuberance.    Order Specific Question:   Preferred imaging location?    Answer:   [facility]    Order Specific Question:   Radiology Contrast Protocol - do NOT remove file path    Answer:   \\[email or web address]\Radiant\DXFluoroContrastProtocols.pdf    Meds ordered this encounter  Medications  . predniSONE (DELTASONE) 10 MG tablet    Sig: Take 4 tablets x 1 day and then decrease by 1/2 tablet per day until down to zero mg.    Dispense:  18 tablet    Refill:  0     I discussed the assessment and treatment plan with the patient. The patient was provided an opportunity to ask questions and all were answered. The patient agreed with the plan and demonstrated an understanding of the instructions.    The patient was advised to call back or seek an in-person evaluation if the symptoms worsen or if the condition fails  to improve as anticipated.   Einar Pheasant, MD

## 2019-09-29 ENCOUNTER — Ambulatory Visit
Admission: RE | Admit: 2019-09-29 | Discharge: 2019-09-29 | Disposition: A | Discharge status: Home / Self Care | Payer: PPO | Source: Ambulatory Visit | Attending: Internal Medicine | Admitting: Internal Medicine

## 2019-09-29 DIAGNOSIS — M1711 Unilateral primary osteoarthritis, right knee: Secondary | ICD-10-CM | POA: Diagnosis not present

## 2019-09-29 DIAGNOSIS — R229 Localized swelling, mass and lump, unspecified: Secondary | ICD-10-CM

## 2019-09-29 DIAGNOSIS — R0602 Shortness of breath: Secondary | ICD-10-CM

## 2019-09-29 MED ORDER — PREDNISONE 10 MG PO TABS: ORAL_TABLET | ORAL | 0 refills | Status: DC

## 2019-10-02 ENCOUNTER — Encounter: Payer: Self-pay | Admitting: Internal Medicine

## 2019-10-02 DIAGNOSIS — M25561 Pain in right knee: Secondary | ICD-10-CM

## 2019-10-02 DIAGNOSIS — R229 Localized swelling, mass and lump, unspecified: Secondary | ICD-10-CM

## 2019-10-02 NOTE — Assessment & Plan Note (Signed)
Has been worked up extensively.  Has been evaluated by nephrology.  Stable.  Follow.   

## 2019-10-02 NOTE — Assessment & Plan Note (Signed)
Followed by hematology 

## 2019-10-02 NOTE — Assessment & Plan Note (Signed)
S/p cardioversion.  On eliquis.  Followed by cardiology.  Stable.

## 2019-10-02 NOTE — Assessment & Plan Note (Signed)
Check cxr as outlined.  Mucinex.  May need prednisone taper.  Follow.

## 2019-10-02 NOTE — Assessment & Plan Note (Signed)
Bony protuberance - knee as outlined.  Check xray.

## 2019-10-02 NOTE — Assessment & Plan Note (Signed)
SOB as outlined.  Could be copd flare.  Discussed possible covid.  He denies known exposure.  Declines to be tested.  Check cxr.  Mucinex.  Discussed possible need for prednisone taper.

## 2019-10-02 NOTE — Assessment & Plan Note (Signed)
Controlled on current medication regimen.

## 2019-10-02 NOTE — Assessment & Plan Note (Signed)
Blood pressure as outlined.  Off lasix.  Follow pressures.  Follow metabolic panel.

## 2019-10-02 NOTE — Assessment & Plan Note (Signed)
On zoloft.  Stable.  

## 2019-10-02 NOTE — Assessment & Plan Note (Signed)
Followed by cardiology.  Just evaluated.  Felt stable.  Continue risk factor modification.

## 2019-10-02 NOTE — Assessment & Plan Note (Signed)
SOB as outlined.  Just saw cardiology yesterday.  Felt stable.  Question if copd flare.  Hold abx.  Mucinex.  Check cxr.  May need prednisone taper. Follow.

## 2019-10-02 NOTE — Assessment & Plan Note (Signed)
Followed by pulmonary and cardiology.  

## 2019-10-04 ENCOUNTER — Ambulatory Visit: Payer: Self-pay | Admitting: Pharmacist

## 2019-10-04 DIAGNOSIS — J449 Chronic obstructive pulmonary disease, unspecified: Secondary | ICD-10-CM

## 2019-10-04 NOTE — Patient Instructions (Signed)
Visit Information  Goals Addressed            This Visit's Progress     Patient Stated   . PharmD "I want to stay healthy (pt-stated)       Current Barriers:  . Financial Barriers - received Eliquis and Breo/Ventolin through patient assistance programs through 08/19/19 o Decided to apply for Dulera/Proventil patient assistance through [employer], as Breo/Ventolin program through [employer] requires him to spend $600 on copays . Polypharmacy; complex patient with multiple medical conditions including aflutter, CAD/HTN, COPD/interstitial lung disease, MGUS, Waldenstrom macroglobulinemia, iron deficiency anemia; depression, chronic pain o F/u hem/onc on 08/16/2019 w/ Dr. Grayland Ormond; continues Venofer for IDA o COPD: Breo 100/25 BID, Ventolin PRN; notes he needs a refill on ICS/LABA inhaler o CAD/HTN/A flutter: Eliquis 5 mg BID, amlodipine 10 mg daily, metoprolol tartrate 100 mg BID, isosorbide mononitrate 30 mg QAM; patient notes he has f/u with Dr. Saralyn Pilar on 09/27/19 o Depression: sertraline 50 mg daily; o Chronic pain: Gabapentin 400 mg QAM, 400 mg midday, 400 mg QPM  Pharmacist Clinical Goal(s):  Marland Kitchen Over the next 90 days, patient will work with PharmD, CPhT, and providers to address needs related to medication access  Interventions: . Provided update on current status of [employer] application for [employer]. Counseled on Attestation Form process. It will take a few more weeks for patient to receive [employer] medications. In the meantime, he will fill Breo at the pharmacy. Reviewed that a 90 day supply was sent in by me in December.    Patient Self Care Activities:  . Self administers medications as prescribed  Please see past updates related to this goal by clicking on the "Past Updates" button in the selected goal         The patient verbalized understanding of instructions provided today and declined a print copy of patient instruction materials.   Plan:  - Will outreach patient as previously  scheduled  Catie [name], PharmD, [place], [place] Pharmacist [place] [phone]

## 2019-10-04 NOTE — Chronic Care Management (AMB) (Signed)
Chronic Care Management   Follow Up Note   10/04/2019 Name: Troy Lopez MRN: [id number] DOB: 01-11-1941  Referred by: Einar Pheasant, MD Reason for referral : Chronic Care Management (Medication Management)   Troy Lopez is a 79 y.o. year old male who is a primary care patient of Einar Pheasant, MD. The CCM team was consulted for assistance with chronic disease management and care coordination needs.    Received call from patient today w/ medication access questions.   Review of patient status, including review of consultants reports, relevant laboratory and other test results, and collaboration with appropriate care team members and the patient's provider was performed as part of comprehensive patient evaluation and provision of chronic care management services.    SDOH (Social Determinants of Health) screening performed today: Financial Strain . See Care Plan for related entries.   Outpatient Encounter Medications as of 10/04/2019  Medication Sig Note  . acetaminophen (TYLENOL) 500 MG tablet Take 500-1,000 mg by mouth 3 (three) times daily as needed for moderate pain or headache. 02/22/2019: 2 tablets 3 times daily   . albuterol (VENTOLIN HFA) 108 (90 Base) MCG/ACT inhaler Inhale 2 puffs into the lungs every 6 (six) hours as needed for wheezing or shortness of breath.   Marland Kitchen amLODipine (NORVASC) 10 MG tablet Take 1 tablet (10 mg total) by mouth daily.   Marland Kitchen azelastine (ASTELIN) 0.1 % nasal spray Place 1 spray into both nostrils 2 (two) times daily. Use in each nostril as directed 02/22/2019: Using PRN  . budesonide (ENTOCORT EC) 3 MG 24 hr capsule Take 9 mg by mouth daily as needed (colitis flare).    . calcium-vitamin D (OSCAL WITH D) 500-200 MG-UNIT tablet Take 2 tablets by mouth daily.   Marland Kitchen ELIQUIS 5 MG TABS tablet Take 5 mg by mouth 2 (two) times daily.    Marland Kitchen FIBER PO Take 1 capsule by mouth daily.    . fluticasone furoate-vilanterol (BREO ELLIPTA) 100-25 MCG/INH AEPB INHALE 1 PUFF  INTO LUNGS DAILY.   . furosemide (LASIX) 20 MG tablet Take 1 tablet (20 mg total) by mouth daily as needed. (Patient taking differently: Take 20 mg by mouth daily. )   . gabapentin (NEURONTIN) 100 MG capsule Take 4 capsules (400 mg) daily in the morning and midday. Take 8 capsules (800 mg) daily in the evening.   . isosorbide mononitrate (IMDUR) 30 MG 24 hr tablet Take 1 tablet (30 mg total) by mouth once daily.   . metoprolol tartrate (LOPRESSOR) 100 MG tablet Take 1 tablet (100 mg total) by mouth 2 (two) times daily.   . pantoprazole (PROTONIX) 40 MG tablet Take 1 tablet (40 mg total) by mouth 2 (two) times daily.   . predniSONE (DELTASONE) 10 MG tablet Take 4 tablets x 1 day and then decrease by 1/2 tablet per day until down to zero mg.   . sertraline (ZOLOFT) 50 MG tablet Take 1 tablet (50 mg total) by mouth daily.   Marland Kitchen triamcinolone ointment (KENALOG) 0.1 %    . vitamin B-12 (CYANOCOBALAMIN) 1000 MCG tablet Take 1,000 mcg by mouth daily.     No facility-administered encounter medications on file as of 10/04/2019.     Objective:   Goals Addressed            This Visit's Progress     Patient Stated   . PharmD "I want to stay healthy (pt-stated)       Current Barriers:  . Financial Barriers - received Eliquis  and Breo/Ventolin through patient assistance programs through 08/19/19 o Decided to apply for Dulera/Proventil patient assistance through [employer], as Breo/Ventolin program through [employer] requires him to spend $600 on copays . Polypharmacy; complex patient with multiple medical conditions including aflutter, CAD/HTN, COPD/interstitial lung disease, MGUS, Waldenstrom macroglobulinemia, iron deficiency anemia; depression, chronic pain o F/u hem/onc on 08/16/2019 w/ Dr. Grayland Ormond; continues Venofer for IDA o COPD: Breo 100/25 BID, Ventolin PRN; notes he needs a refill on ICS/LABA inhaler o CAD/HTN/A flutter: Eliquis 5 mg BID, amlodipine 10 mg daily, metoprolol tartrate 100 mg BID,  isosorbide mononitrate 30 mg QAM; patient notes he has f/u with Dr. Saralyn Pilar on 09/27/19 o Depression: sertraline 50 mg daily; o Chronic pain: Gabapentin 400 mg QAM, 400 mg midday, 400 mg QPM  Pharmacist Clinical Goal(s):  Marland Kitchen Over the next 90 days, patient will work with PharmD, CPhT, and providers to address needs related to medication access  Interventions: . Provided update on current status of [employer] application for [employer]. Counseled on Attestation Form process. It will take a few more weeks for patient to receive [employer] medications. In the meantime, he will fill Breo at the pharmacy. Reviewed that a 90 day supply was sent in by me in December.    Patient Self Care Activities:  . Self administers medications as prescribed  Please see past updates related to this goal by clicking on the "Past Updates" button in the selected goal          Plan:  - Will outreach patient as previously scheduled  Catie [name], PharmD, [place], [place] Pharmacist [place] [phone]

## 2019-10-05 NOTE — Progress Notes (Signed)
Reviewed information.  Agree with plan.    Dr Annaly Skop 

## 2019-10-07 NOTE — Telephone Encounter (Signed)
Order placed for ortho referral.   

## 2019-10-15 ENCOUNTER — Other Ambulatory Visit: Payer: Self-pay | Admitting: Pharmacy Technician

## 2019-10-15 NOTE — Patient Outreach (Signed)
[place] [facility]) Care Management  10/15/2019  [place] [birth year] [id number]   Care coordination call placed to [employer] in regards to patient's application for [facility] and Proventil.  Spoke to [place] who informed they have not received the application yet that was mailed to them on 09/16/2019. Liliane Channel suggested to try back next week.  Will follow up with [employer] in 7-10 business days.  Oddie Kuhlmann P. Olanna Percifield, [place] Management [phone]

## 2019-10-16 NOTE — Progress Notes (Signed)
[facility]  [phone]) [phone] [phone]  ID: Troy Lopez OB: 10/01/1940  MR#: [id number]  HYQ#:[id number]  Patient Care Team: Einar Pheasant, MD as PCP - General (Internal Medicine) Lollie Sails, MD (Inactive) as Consulting Physician (Gastroenterology) Ok Edwards, NP as Nurse Practitioner (Gastroenterology) Simcox, Luiz Ochoa, CPhT as [place] Management (Pharmacy Technician)  CHIEF COMPLAINT: [name]'s macroglobulinemia, iron deficiency anemia.  INTERVAL HISTORY: Patient returns to clinic today for repeat laboratory work, further evaluation, and consideration of additional IV Feraheme.  He had a recent COPD exacerbation, but is nearly fully recovered.  He admits he is not quite back to his baseline now.  He has chronic weakness and fatigue.  He has no neurologic complaints.  He denies any fevers.  He has a good appetite and denies weight loss.  He denies any chest pain, shortness of breath, cough, or hemoptysis.  He denies any nausea, vomiting, constipation, or diarrhea.  He denies any melena or hematochezia.  He has no urinary complaints.  Patient offers no further specific complaints today.  REVIEW OF SYSTEMS:   Review of Systems  Constitutional: Positive for malaise/fatigue. Negative for fever and weight loss.  Respiratory: Negative.  Negative for cough, hemoptysis and shortness of breath.   Cardiovascular: Negative.  Negative for chest pain and leg swelling.  Gastrointestinal: Negative.  Negative for abdominal pain, blood in stool, diarrhea and melena.  Genitourinary: Negative.  Negative for hematuria.  Musculoskeletal: Negative.  Negative for back pain.  Skin: Negative.  Negative for rash.  Neurological: Positive for weakness. Negative for dizziness, sensory change, focal weakness and headaches.  Psychiatric/Behavioral: Negative.  The patient is not nervous/anxious.     As per HPI. Otherwise, a  complete review of systems is negative.  PAST MEDICAL HISTORY: Past Medical History:  Diagnosis Date  . Adrenal gland anomaly    enlargement  . Anxiety   . CAD (coronary artery disease)   . Carpal tunnel syndrome   . Chronic hyponatremia   . Colitis   . Colonic polyp   . COPD (chronic obstructive pulmonary disease) ([place])   . Degenerative disc disease, lumbar   . Depression   . Diverticulosis   . Dyspnea   . GERD (gastroesophageal reflux disease)   . H/O degenerative disc disease   . Hypercholesterolemia   . Hyperkalemia   . Hyperlipidemia   . Hypertension   . Irritable bowel syndrome   . Monoclonal gammopathy   . Monoclonal gammopathy   . Neuropathy   . Personal history of tobacco use, presenting hazards to health 08/17/2015  . Sleep apnea   . Vertebral compression fracture ([place])     PAST SURGICAL HISTORY: Past Surgical History:  Procedure Laterality Date  . BUNIONECTOMY  1989  . CARPAL TUNNEL RELEASE Left 2011   ulnar nerve sub muscular at elbow  . CHOLECYSTECTOMY  09/07  . COLONOSCOPY WITH PROPOFOL N/A 03/05/2017   Procedure: COLONOSCOPY WITH PROPOFOL;  Surgeon: Lucilla Lame, MD;  Location: [facility] [place] ENDOSCOPY;  Service: Endoscopy;  Laterality: N/A;  . ELECTROPHYSIOLOGIC STUDY N/A 11/15/2015   Procedure: CARDIOVERSION;  Surgeon: Isaias Cowman, MD;  Location: [facility] ORS;  Service: Cardiovascular;  Laterality: N/A;  . ESOPHAGOGASTRODUODENOSCOPY (EGD) WITH PROPOFOL N/A 03/05/2017   Procedure: ESOPHAGOGASTRODUODENOSCOPY (EGD) WITH PROPOFOL;  Surgeon: Lucilla Lame, MD;  Location: [facility] ENDOSCOPY;  Service: Endoscopy;  Laterality: N/A;  . ESOPHAGOGASTRODUODENOSCOPY (EGD) WITH PROPOFOL N/A 10/17/2017   Procedure: ESOPHAGOGASTRODUODENOSCOPY (EGD) WITH PROPOFOL;  Surgeon: Lollie Sails, MD;  Location:  [facility] ENDOSCOPY;  Service: Endoscopy;  Laterality: N/A;  . EYE SURGERY Bilateral 2010   cataract  . HEMORRHOID SURGERY    . KYPHOPLASTY N/A 11/04/2016   Procedure: KYPHOPLASTY T  12;  Surgeon: Hessie Knows, MD;  Location: [facility] ORS;  Service: Orthopedics;  Laterality: N/A;    FAMILY HISTORY Family History  Problem Relation Age of Onset  . Stroke Mother   . Heart disease Father        MI - 45   . Colon cancer Neg Hx   . Prostate cancer Neg Hx        ADVANCED DIRECTIVES:    HEALTH MAINTENANCE: Social History   Tobacco Use  . Smoking status: Former Smoker    Types: Cigarettes    Quit date: 11/01/2015    Years since quitting: 3.9  . Smokeless tobacco: Never Used  . Tobacco comment: about 2 cigarettes per day, trying to quit  Substance Use Topics  . Alcohol use: Yes    Alcohol/week: 42.0 standard drinks    Types: 42 Cans of beer per week    Comment: occas  . Drug use: No     Colonoscopy:  PAP:  Bone density:  Lipid panel:  Allergies  Allergen Reactions  . Iodinated Diagnostic Agents Other (See Comments)    Contraindication secondary to IGMgammopathy/Waldren's syndrome   Not to be given due to Waldenstrom's syndrome, told it could affect kidney function    Current Outpatient Medications  Medication Sig Dispense Refill  . acetaminophen (TYLENOL) 500 MG tablet Take 500-1,000 mg by mouth 3 (three) times daily as needed for moderate pain or headache.    . albuterol (VENTOLIN HFA) 108 (90 Base) MCG/ACT inhaler Inhale 2 puffs into the lungs every 6 (six) hours as needed for wheezing or shortness of breath. 54 g 3  . amLODipine (NORVASC) 10 MG tablet Take 1 tablet (10 mg total) by mouth daily. 90 tablet 1  . azelastine (ASTELIN) 0.1 % nasal spray Place 1 spray into both nostrils 2 (two) times daily. Use in each nostril as directed (Patient not taking: Reported on 10/18/2019) 30 mL 1  . budesonide (ENTOCORT EC) 3 MG 24 hr capsule Take 9 mg by mouth daily as needed (colitis flare).     . calcium-vitamin D (OSCAL WITH D) 500-200 MG-UNIT tablet Take 2 tablets by mouth daily.    Marland Kitchen ELIQUIS 5 MG TABS tablet Take 5 mg by mouth 2 (two) times daily.     Marland Kitchen FIBER  PO Take 1 capsule by mouth daily.     . Fluticasone-Umeclidin-Vilant (TRELEGY ELLIPTA) 100-62.5-25 MCG/INH AEPB Inhale 1 puff into the lungs daily. 1 each 2  . furosemide (LASIX) 20 MG tablet Take 1 tablet (20 mg total) by mouth daily as needed. 30 tablet 0  . gabapentin (NEURONTIN) 100 MG capsule Take 4 capsules (400 mg) daily in the morning and midday. Take 8 capsules (800 mg) daily in the evening. 480 capsule 0  . isosorbide mononitrate (IMDUR) 30 MG 24 hr tablet Take 1 tablet (30 mg total) by mouth once daily.    . metoprolol tartrate (LOPRESSOR) 100 MG tablet Take 1 tablet (100 mg total) by mouth 2 (two) times daily. 60 tablet 0  . pantoprazole (PROTONIX) 40 MG tablet Take 1 tablet (40 mg total) by mouth 2 (two) times daily. 180 tablet 0  . sertraline (ZOLOFT) 50 MG tablet Take 1 tablet (50 mg total) by mouth daily. 90 tablet 1  . triamcinolone ointment (KENALOG) 0.1 %     .  vitamin B-12 (CYANOCOBALAMIN) 1000 MCG tablet Take 1,000 mcg by mouth daily.      No current facility-administered medications for this visit.    OBJECTIVE: Vitals:   10/19/19 1310  BP: (!) 153/82  Pulse: 60  Resp: 20  Temp: 97.6 F (36.4 C)  SpO2: 98%     Body mass index is 26.11 kg/m.    ECOG FS:0 - Asymptomatic  General: Well-developed, well-nourished, no acute distress. Eyes: Pink conjunctiva, anicteric sclera. HEENT: Normocephalic, moist mucous membranes. Lungs: No audible wheezing or coughing. Heart: Regular rate and rhythm. Abdomen: Soft, nontender, no obvious distention. Musculoskeletal: No edema, cyanosis, or clubbing. Neuro: Alert, answering all questions appropriately. Cranial nerves grossly intact. Skin: No rashes or petechiae noted. Psych: Normal affect.  LAB RESULTS:  Lab Results  Component Value Date   NA 130 (L) 10/19/2019   K 4.4 10/19/2019   CL 95 (L) 10/19/2019   CO2 23 10/19/2019   GLUCOSE 88 10/19/2019   BUN 14 10/19/2019   CREATININE 1.10 10/19/2019   CALCIUM 9.8  10/19/2019   PROT 8.2 11/16/2018   ALBUMIN 3.6 11/16/2018   AST 16 11/16/2018   ALT 11 11/16/2018   ALKPHOS 71 11/16/2018   BILITOT 0.3 11/16/2018   GFRNONAA >60 10/19/2019   GFRAA >60 10/19/2019    Lab Results  Component Value Date   WBC 7.3 10/19/2019   NEUTROABS 5.2 10/19/2019   HGB 12.1 (L) 10/19/2019   HCT 37.0 (L) 10/19/2019   MCV 107.2 (H) 10/19/2019   PLT 326 10/19/2019   Lab Results  Component Value Date   IRON 105 09/07/2019   TIBC 1,089 (H) 09/07/2019   IRONPCTSAT 10 (L) 09/07/2019    Lab Results  Component Value Date   FERRITIN 20 (L) 09/07/2019   Lab Results  Component Value Date   TOTALPROTELP 8.5 09/07/2019   ALBUMINELP 3.5 09/07/2019   A1GS 0.2 09/07/2019   A2GS 0.6 09/07/2019   BETS 1.1 09/07/2019   BETA2SER 0.2 02/14/2016   GAMS 3.1 (H) 09/07/2019   MSPIKE 0.5 (H) 09/07/2019   SPEI Comment 09/07/2019    STUDIES: DG Chest 2 View  Result Date: 09/29/2019 CLINICAL DATA:  Shortness of breath worsening over several weeks gradually, COPD, coronary artery disease, hypertension EXAM: CHEST - 2 VIEW COMPARISON:  10/06/2018 FINDINGS: Normal heart size, mediastinal contours, and pulmonary vascularity. Atherosclerotic calcification aorta. Emphysematous and chronic bronchitic changes consistent with COPD. Chronic interstitial disease changes in mid lungs bilaterally, stable. Scarring at lung bases, stable. No acute infiltrate, pleural effusion or pneumothorax. Bones demineralized. IMPRESSION: Changes of COPD and chronic interstitial lung disease with bibasilar scarring. No acute abnormalities. Electronically Signed   By: Lavonia Dana M.D.   On: 09/29/2019 10:50   DG Knee 1-2 Views Right  Result Date: 09/29/2019 CLINICAL DATA:  Skin nodule RIGHT knee, bone protrude rinse EXAM: RIGHT KNEE - 1-2 VIEW COMPARISON:  None FINDINGS: Osseous demineralization. Mild joint space narrowing with chondrocalcinosis question CPPD. No acute fracture, dislocation, or bone  destruction. No definite bone protrusion/mass. No knee joint effusion. Atherosclerotic calcifications of distal superficial femoral and popliteal arteries. IMPRESSION: Mild degenerative changes and question CPPD RIGHT knee. No acute abnormalities. Electronically Signed   By: Lavonia Dana M.D.   On: 09/29/2019 10:59    ASSESSMENT: [name]'s macroglobulinemia, iron deficiency anemia.  PLAN:    1. [name]'s macroglobulinemia:  Bone marrow biopsy results from Jan 08, 2017 reviewed independently confirming underlying [name]'s.  Patient's M spike has been erratic ranging from 1.1 in  May 2018 to as high as 2.6 in October 2018.  His most recent result on September 07, 2019 has trended down even further and is now 0.5.  His IgM levels remain persistently elevated and have trended up slightly to 3604.  His kappa free light chains remain elevated at 83.5, but his kappa/lambda light chain ratio has trended down slightly to 4.8.  He has persistent iron deficiency anemia from a GI source, but no other evidence of endorgan damage. He does not require any treatment at this time. If he had progression of disease as evidenced by B-symptoms or endorgan damage, will consider Rituxan-based therapy.  Continue to monitor every 3 to 6 months. 2. 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  Patient's most recent infusion of 510 mg IV Feraheme was on September 07, 2019.  His hemoglobin has improved and is now 12.1.  Iron stores are pending at time of dictation.  He does not require additional Feraheme today.  Return to clinic in 2 months with repeat laboratory work, further evaluation, and continuation of treatment if needed.  3.  Back pain: Chronic and unchanged.  Continue monitoring and treatment per pain clinic.    Patient expressed understanding and was in agreement  with this plan. He also understands that He can call clinic at any time with any questions, concerns, or complaints.    Lloyd Huger, MD   10/19/2019 2:08 PM

## 2019-10-18 ENCOUNTER — Ambulatory Visit (INDEPENDENT_AMBULATORY_CARE_PROVIDER_SITE_OTHER): Payer: PPO | Admitting: Pharmacist

## 2019-10-18 DIAGNOSIS — J449 Chronic obstructive pulmonary disease, unspecified: Secondary | ICD-10-CM

## 2019-10-18 NOTE — Chronic Care Management (AMB) (Signed)
Chronic Care Management   Follow Up Note   10/18/2019 Name: Troy Lopez MRN: [id number] DOB: 28-Feb-1941  Referred by: Einar Pheasant, MD Reason for referral : Chronic Care Management (Medication Management)   Troy Lopez is a 79 y.o. year old male who is a primary care patient of Einar Pheasant, MD. The CCM team was consulted for assistance with chronic disease management and care coordination needs.    Contacted patient for medication management review.   Review of patient status, including review of consultants reports, relevant laboratory and other test results, and collaboration with appropriate care team members and the patient's provider was performed as part of comprehensive patient evaluation and provision of chronic care management services.    SDOH (Social Determinants of Health) assessments performed: No See Care Plan activities for detailed interventions related to [facility])     Outpatient Encounter Medications as of 10/18/2019  Medication Sig Note  . albuterol (VENTOLIN HFA) 108 (90 Base) MCG/ACT inhaler Inhale 2 puffs into the lungs every 6 (six) hours as needed for wheezing or shortness of breath. 10/18/2019: Using 2-10 times daily   . amLODipine (NORVASC) 10 MG tablet Take 1 tablet (10 mg total) by mouth daily.   . calcium-vitamin D (OSCAL WITH D) 500-200 MG-UNIT tablet Take 2 tablets by mouth daily.   Marland Kitchen ELIQUIS 5 MG TABS tablet Take 5 mg by mouth 2 (two) times daily.    Marland Kitchen FIBER PO Take 1 capsule by mouth daily.    . fluticasone furoate-vilanterol (BREO ELLIPTA) 100-25 MCG/INH AEPB INHALE 1 PUFF INTO LUNGS DAILY.   . furosemide (LASIX) 20 MG tablet Take 1 tablet (20 mg total) by mouth daily as needed. 10/18/2019: PRN  . gabapentin (NEURONTIN) 100 MG capsule Take 4 capsules (400 mg) daily in the morning and midday. Take 8 capsules (800 mg) daily in the evening.   . isosorbide mononitrate (IMDUR) 30 MG 24 hr tablet Take 1 tablet (30 mg total) by mouth once daily.   .  metoprolol tartrate (LOPRESSOR) 100 MG tablet Take 1 tablet (100 mg total) by mouth 2 (two) times daily.   . pantoprazole (PROTONIX) 40 MG tablet Take 1 tablet (40 mg total) by mouth 2 (two) times daily.   . sertraline (ZOLOFT) 50 MG tablet Take 1 tablet (50 mg total) by mouth daily.   . vitamin B-12 (CYANOCOBALAMIN) 1000 MCG tablet Take 1,000 mcg by mouth daily.    Marland Kitchen acetaminophen (TYLENOL) 500 MG tablet Take 500-1,000 mg by mouth 3 (three) times daily as needed for moderate pain or headache. 02/22/2019: 2 tablets 3 times daily   . azelastine (ASTELIN) 0.1 % nasal spray Place 1 spray into both nostrils 2 (two) times daily. Use in each nostril as directed (Patient not taking: Reported on 10/18/2019) 02/22/2019: Using PRN  . budesonide (ENTOCORT EC) 3 MG 24 hr capsule Take 9 mg by mouth daily as needed (colitis flare).    . triamcinolone ointment (KENALOG) 0.1 %    . [DISCONTINUED] predniSONE (DELTASONE) 10 MG tablet Take 4 tablets x 1 day and then decrease by 1/2 tablet per day until down to zero mg.    No facility-administered encounter medications on file as of 10/18/2019.     Objective:   Goals Addressed            This Visit's Progress     Patient Stated   . PharmD "I want to stay healthy (pt-stated)       CARE PLAN ENTRY (see longtitudinal plan  of care for additional care plan information)  Current Barriers:  . Financial Barriers - received Eliquis and Breo/Ventolin through patient assistance programs through 08/19/19 o Decided to apply for Dulera/Proventil patient assistance through [employer], as Breo/Ventolin program through [employer] requires him to spend $600 on copays. Per call to [employer] last week, patient's application has not been processed.  . Polypharmacy; complex patient with multiple medical conditions including aflutter, CAD/HTN, COPD/interstitial lung disease, MGUS, Waldenstrom macroglobulinemia, iron deficiency anemia; depression, chronic pain o IDA/MGUS, macroglobulinemia: infusion  and appointment w/ Dr. Grayland Ormond tomorrow; continues Venofer for IDA o COPD: Breo 100/25 BID, Ventolin PRN; noting that he is using Ventolin 2-10 times daily. Endorses worsening SOB over the past few weeks. - Previously followed w/ Dr. Alva Garnet, has not had f/u since he left the practice (last appt 04/2018). Last PFT showed FEV1/FVC 47%. Previous trials of Incruse (patient denied benefit, though was still smoking at that time) and Spiriva (denied benefit, though cost was a concern at that point) o CAD/HTN/A flutter: Eliquis 5 mg BID, amlodipine 10 mg daily, metoprolol tartrate 100 mg BID, isosorbide mononitrate 30 mg QAM;  o Depression: sertraline 50 mg daily; o Chronic pain: Gabapentin 400 mg QAM, 400 mg midday, 800 mg QPM  Pharmacist Clinical Goal(s):  Marland Kitchen Over the next 90 days, patient will work with PharmD, CPhT, and providers to address needs related to medication access  and ensure optimized medication  Interventions: . Comprehensive medication review performed, medication list updated in electronic medical record . Reviewed current issues w/ SOB. May be appropriate to consider re-referral to Pulmonary vs changing from ICS/LABA to triple therapy ICS/LABA/LAMA. Now that patient is not smoking, he may benefit more from triple therapy. Trelegy is covered on patient's insurance plan. Will determine Dr. Bary Leriche preferred plan.   Patient Self Care Activities:  . Self administers medications as prescribed  Please see past updates related to this goal by clicking on the "Past Updates" button in the selected goal          Plan:  - Scheduled f/u call 11/29/19  Catie [name], PharmD, BCACP, CPP Clinical Pharmacist [place] [phone]

## 2019-10-18 NOTE — Patient Instructions (Signed)
Visit Information  Goals Addressed            This Visit's Progress     Patient Stated   . PharmD "I want to stay healthy (pt-stated)       CARE PLAN ENTRY (see longtitudinal plan of care for additional care plan information)  Current Barriers:  . Financial Barriers - received Eliquis and Breo/Ventolin through patient assistance programs through 08/19/19 o Decided to apply for Dulera/Proventil patient assistance through [employer], as Breo/Ventolin program through [employer] requires him to spend $600 on copays. Per call to [employer] last week, patient's application has not been processed.  . Polypharmacy; complex patient with multiple medical conditions including aflutter, CAD/HTN, COPD/interstitial lung disease, MGUS, Waldenstrom macroglobulinemia, iron deficiency anemia; depression, chronic pain o IDA/MGUS, macroglobulinemia: infusion and appointment w/ Dr. Grayland Ormond tomorrow; continues Venofer for IDA o COPD: Breo 100/25 BID, Ventolin PRN; noting that he is using Ventolin 2-10 times daily. Endorses worsening SOB over the past few weeks. - Previously followed w/ Dr. Alva Garnet, has not had f/u since he left the practice (last appt 04/2018). Last PFT showed FEV1/FVC 47%. Previous trials of Incruse (patient denied benefit, though was still smoking at that time) and Spiriva (denied benefit, though cost was a concern at that point) o CAD/HTN/A flutter: Eliquis 5 mg BID, amlodipine 10 mg daily, metoprolol tartrate 100 mg BID, isosorbide mononitrate 30 mg QAM;  o Depression: sertraline 50 mg daily; o Chronic pain: Gabapentin 400 mg QAM, 400 mg midday, 800 mg QPM  Pharmacist Clinical Goal(s):  Marland Kitchen Over the next 90 days, patient will work with PharmD, CPhT, and providers to address needs related to medication access  and ensure optimized medication  Interventions: . Comprehensive medication review performed, medication list updated in electronic medical record . Reviewed current issues w/ SOB. May be appropriate  to consider re-referral to Pulmonary vs changing from ICS/LABA to triple therapy ICS/LABA/LAMA. Now that patient is not smoking, he may benefit more from triple therapy. Trelegy is covered on patient's insurance plan. Will determine Dr. Bary Leriche preferred plan.   Patient Self Care Activities:  . Self administers medications as prescribed  Please see past updates related to this goal by clicking on the "Past Updates" button in the selected goal         Patient verbalizes understanding of instructions provided today.   Plan:  - Scheduled f/u call 11/29/19  Catie [name], PharmD, BCACP, CPP Clinical Pharmacist [place] [phone]

## 2019-10-19 ENCOUNTER — Other Ambulatory Visit: Payer: Self-pay

## 2019-10-19 ENCOUNTER — Encounter: Payer: Self-pay | Admitting: Oncology

## 2019-10-19 ENCOUNTER — Ambulatory Visit: Payer: Self-pay | Admitting: Pharmacist

## 2019-10-19 ENCOUNTER — Inpatient Hospital Stay: Payer: PPO

## 2019-10-19 ENCOUNTER — Inpatient Hospital Stay: Payer: PPO | Attending: Oncology

## 2019-10-19 ENCOUNTER — Inpatient Hospital Stay (HOSPITAL_BASED_OUTPATIENT_CLINIC_OR_DEPARTMENT_OTHER): Payer: PPO | Admitting: Oncology

## 2019-10-19 VITALS — BP 153/82 | HR 60 | Temp 97.6°F | Resp 20 | Wt 182.0 lb

## 2019-10-19 DIAGNOSIS — I2510 Atherosclerotic heart disease of native coronary artery without angina pectoris: Secondary | ICD-10-CM | POA: Diagnosis not present

## 2019-10-19 DIAGNOSIS — C880 Waldenstrom macroglobulinemia: Secondary | ICD-10-CM

## 2019-10-19 DIAGNOSIS — M549 Dorsalgia, unspecified: Secondary | ICD-10-CM | POA: Insufficient documentation

## 2019-10-19 DIAGNOSIS — J449 Chronic obstructive pulmonary disease, unspecified: Secondary | ICD-10-CM | POA: Diagnosis not present

## 2019-10-19 DIAGNOSIS — G629 Polyneuropathy, unspecified: Secondary | ICD-10-CM | POA: Insufficient documentation

## 2019-10-19 DIAGNOSIS — Z86010 Personal history of colonic polyps: Secondary | ICD-10-CM | POA: Diagnosis not present

## 2019-10-19 DIAGNOSIS — F419 Anxiety disorder, unspecified: Secondary | ICD-10-CM | POA: Diagnosis not present

## 2019-10-19 DIAGNOSIS — G4730 Sleep apnea, unspecified: Secondary | ICD-10-CM | POA: Insufficient documentation

## 2019-10-19 DIAGNOSIS — G8929 Other chronic pain: Secondary | ICD-10-CM | POA: Insufficient documentation

## 2019-10-19 DIAGNOSIS — E7800 Pure hypercholesterolemia, unspecified: Secondary | ICD-10-CM | POA: Diagnosis not present

## 2019-10-19 DIAGNOSIS — Z7901 Long term (current) use of anticoagulants: Secondary | ICD-10-CM | POA: Insufficient documentation

## 2019-10-19 DIAGNOSIS — R531 Weakness: Secondary | ICD-10-CM | POA: Insufficient documentation

## 2019-10-19 DIAGNOSIS — D509 Iron deficiency anemia, unspecified: Secondary | ICD-10-CM

## 2019-10-19 DIAGNOSIS — R5383 Other fatigue: Secondary | ICD-10-CM | POA: Insufficient documentation

## 2019-10-19 DIAGNOSIS — M5136 Other intervertebral disc degeneration, lumbar region: Secondary | ICD-10-CM | POA: Diagnosis not present

## 2019-10-19 DIAGNOSIS — I10 Essential (primary) hypertension: Secondary | ICD-10-CM | POA: Diagnosis not present

## 2019-10-19 DIAGNOSIS — F329 Major depressive disorder, single episode, unspecified: Secondary | ICD-10-CM | POA: Diagnosis not present

## 2019-10-19 DIAGNOSIS — Z79899 Other long term (current) drug therapy: Secondary | ICD-10-CM | POA: Insufficient documentation

## 2019-10-19 DIAGNOSIS — K219 Gastro-esophageal reflux disease without esophagitis: Secondary | ICD-10-CM | POA: Diagnosis not present

## 2019-10-19 DIAGNOSIS — Z87891 Personal history of nicotine dependence: Secondary | ICD-10-CM | POA: Diagnosis not present

## 2019-10-19 DIAGNOSIS — E785 Hyperlipidemia, unspecified: Secondary | ICD-10-CM | POA: Insufficient documentation

## 2019-10-19 LAB — CBC WITH DIFFERENTIAL/PLATELET
Abs Immature Granulocytes: 0.02 10*3/uL (ref 0.00–0.07)
Basophils Absolute: 0 10*3/uL (ref 0.0–0.1)
Basophils Relative: 1 %
Eosinophils Absolute: 0 10*3/uL (ref 0.0–0.5)
Eosinophils Relative: 0 %
HCT: 37 % — ABNORMAL LOW (ref 39.0–52.0)
Hemoglobin: 12.1 g/dL — ABNORMAL LOW (ref 13.0–17.0)
Immature Granulocytes: 0 %
Lymphocytes Relative: 17 %
Lymphs Abs: 1.3 10*3/uL (ref 0.7–4.0)
MCH: 35.1 pg — ABNORMAL HIGH (ref 26.0–34.0)
MCHC: 32.7 g/dL (ref 30.0–36.0)
MCV: 107.2 fL — ABNORMAL HIGH (ref 80.0–100.0)
Monocytes Absolute: 0.7 10*3/uL (ref 0.1–1.0)
Monocytes Relative: 10 %
Neutro Abs: 5.2 10*3/uL (ref 1.7–7.7)
Neutrophils Relative %: 72 %
Platelets: 326 10*3/uL (ref 150–400)
RBC: 3.45 MIL/uL — ABNORMAL LOW (ref 4.22–5.81)
RDW: 14.7 % (ref 11.5–15.5)
WBC: 7.3 10*3/uL (ref 4.0–10.5)
nRBC: 0 % (ref 0.0–0.2)

## 2019-10-19 LAB — FERRITIN: Ferritin: 31 ng/mL (ref 24–336)

## 2019-10-19 LAB — BASIC METABOLIC PANEL
Anion gap: 12 (ref 5–15)
BUN: 14 mg/dL (ref 8–23)
CO2: 23 mmol/L (ref 22–32)
Calcium: 9.8 mg/dL (ref 8.9–10.3)
Chloride: 95 mmol/L — ABNORMAL LOW (ref 98–111)
Creatinine, Ser: 1.1 mg/dL (ref 0.61–1.24)
GFR calc Af Amer: >60 mL/min (ref >60)
GFR calc non Af Amer: >60 mL/min (ref >60)
Glucose, Bld: 88 mg/dL (ref 70–99)
Potassium: 4.4 mmol/L (ref 3.5–5.1)
Sodium: 130 mmol/L — ABNORMAL LOW (ref 135–145)

## 2019-10-19 LAB — IRON AND TIBC
Iron: 151 ug/dL (ref 45–182)
Saturation Ratios: 15 % — ABNORMAL LOW (ref 17.9–39.5)
TIBC: 1012 ug/dL — ABNORMAL HIGH (ref 250–450)
UIBC: 861 ug/dL

## 2019-10-19 MED ORDER — TRELEGY ELLIPTA 100-62.5-25 MCG/INH IN AEPB: 1.0000 | INHALATION_SPRAY | Freq: Every day | RESPIRATORY_TRACT | 2 refills | Status: DC

## 2019-10-19 NOTE — Progress Notes (Signed)
Pt here for follow up for IDA. No questions or concerns.

## 2019-10-19 NOTE — Progress Notes (Signed)
Reviewed above information.  Agree with change in inhalers and referral back to pulmonary.  Note to pharmacy.    Dr Nicki Reaper

## 2019-10-19 NOTE — Chronic Care Management (AMB) (Signed)
Chronic Care Management   Follow Up Note   10/19/2019 Name: Troy Lopez MRN: [id number] DOB: [birth year]  Referred by: Einar Pheasant, MD Reason for referral : Chronic Care Management (Medication Management)   Troy Lopez is a 79 y.o. year old male who is a primary care patient of Einar Pheasant, MD. The CCM team was consulted for assistance with chronic disease management and care coordination needs.    Care coordination completed today.  Review of patient status, including review of consultants reports, relevant laboratory and other test results, and collaboration with appropriate care team members and the patient's provider was performed as part of comprehensive patient evaluation and provision of chronic care management services.    SDOH (Social Determinants of Health) assessments performed: No See Care Plan activities for detailed interventions related to [facility])     Outpatient Encounter Medications as of 10/19/2019  Medication Sig Note  . acetaminophen (TYLENOL) 500 MG tablet Take 500-1,000 mg by mouth 3 (three) times daily as needed for moderate pain or headache. 02/22/2019: 2 tablets 3 times daily   . albuterol (VENTOLIN HFA) 108 (90 Base) MCG/ACT inhaler Inhale 2 puffs into the lungs every 6 (six) hours as needed for wheezing or shortness of breath. 10/18/2019: Using 2-10 times daily   . amLODipine (NORVASC) 10 MG tablet Take 1 tablet (10 mg total) by mouth daily.   Marland Kitchen azelastine (ASTELIN) 0.1 % nasal spray Place 1 spray into both nostrils 2 (two) times daily. Use in each nostril as directed (Patient not taking: Reported on 10/18/2019) 02/22/2019: Using PRN  . budesonide (ENTOCORT EC) 3 MG 24 hr capsule Take 9 mg by mouth daily as needed (colitis flare).    . calcium-vitamin D (OSCAL WITH D) 500-200 MG-UNIT tablet Take 2 tablets by mouth daily.   Marland Kitchen ELIQUIS 5 MG TABS tablet Take 5 mg by mouth 2 (two) times daily.    Marland Kitchen FIBER PO Take 1 capsule by mouth daily.    .  Fluticasone-Umeclidin-Vilant (TRELEGY ELLIPTA) 100-62.5-25 MCG/INH AEPB Inhale 1 puff into the lungs daily.   . furosemide (LASIX) 20 MG tablet Take 1 tablet (20 mg total) by mouth daily as needed. 10/18/2019: PRN  . gabapentin (NEURONTIN) 100 MG capsule Take 4 capsules (400 mg) daily in the morning and midday. Take 8 capsules (800 mg) daily in the evening.   . isosorbide mononitrate (IMDUR) 30 MG 24 hr tablet Take 1 tablet (30 mg total) by mouth once daily.   . metoprolol tartrate (LOPRESSOR) 100 MG tablet Take 1 tablet (100 mg total) by mouth 2 (two) times daily.   . pantoprazole (PROTONIX) 40 MG tablet Take 1 tablet (40 mg total) by mouth 2 (two) times daily.   . sertraline (ZOLOFT) 50 MG tablet Take 1 tablet (50 mg total) by mouth daily.   Marland Kitchen triamcinolone ointment (KENALOG) 0.1 %    . vitamin B-12 (CYANOCOBALAMIN) 1000 MCG tablet Take 1,000 mcg by mouth daily.    . [DISCONTINUED] fluticasone furoate-vilanterol (BREO ELLIPTA) 100-25 MCG/INH AEPB INHALE 1 PUFF INTO LUNGS DAILY.    No facility-administered encounter medications on file as of 10/19/2019.     Objective:   Goals Addressed            This Visit's Progress     Patient Stated   . PharmD "I want to stay healthy (pt-stated)       CARE PLAN ENTRY (see longtitudinal plan of care for additional care plan information)  Current Barriers:  . Financial Barriers -  received Eliquis and Breo/Ventolin through patient assistance programs through 08/19/19, working on reapplication for [facility] right now o Given uncontrolled COPD on current ICS/LABA therapy, decided to escalate to triple ICS/LABA/LAMA therapy w/ Trelegy. . Polypharmacy; complex patient with multiple medical conditions including aflutter, CAD/HTN, COPD/interstitial lung disease, MGUS, Waldenstrom macroglobulinemia, iron deficiency anemia; depression, chronic pain o IDA/MGUS, macroglobulinemia: infusion and appointment w/ Dr. Grayland Ormond tomorrow; continues Venofer for IDA o COPD:  Breo 100/25 BID, Ventolin PRN; noting that he is using Ventolin 2-10 times daily. Endorses worsening SOB over the past few weeks. - Previously followed w/ Dr. Alva Garnet, has not had f/u since he left the practice (last appt 04/2018). Last PFT showed FEV1/FVC 47%. Previous trials of Incruse (patient denied benefit, though was still smoking at that time) and Spiriva (denied benefit, though cost was a concern at that point) o CAD/HTN/A flutter: Eliquis 5 mg BID, amlodipine 10 mg daily, metoprolol tartrate 100 mg BID, isosorbide mononitrate 30 mg QAM;  o Depression: sertraline 50 mg daily; o Chronic pain: Gabapentin 400 mg QAM, 400 mg midday, 800 mg QPM  Pharmacist Clinical Goal(s):  Marland Kitchen Over the next 90 days, patient will work with PharmD, CPhT, and providers to address needs related to medication access  and ensure optimized medication  Interventions: . Stop Breo. Start Trelegy 1 puff once daily. Sent prescription to [facility]. Will collaborate w/ CMA to update patient on this change and to ask if agreeable to pulmonary referral.   Patient Self Care Activities:  . Self administers medications as prescribed  Please see past updates related to this goal by clicking on the "Past Updates" button in the selected goal          Plan:  - Will outreach patient as previously scheduled for f/u  Catie [name], PharmD, [place], [place] Pharmacist [place] [phone]

## 2019-10-19 NOTE — Patient Instructions (Signed)
Visit Information  Goals Addressed            This Visit's Progress     Patient Stated   . PharmD "I want to stay healthy (pt-stated)       CARE PLAN ENTRY (see longtitudinal plan of care for additional care plan information)  Current Barriers:  . Financial Barriers - received Eliquis and Breo/Ventolin through patient assistance programs through 08/19/19, working on reapplication for [facility] right now o Given uncontrolled COPD on current ICS/LABA therapy, decided to escalate to triple ICS/LABA/LAMA therapy w/ Trelegy. . Polypharmacy; complex patient with multiple medical conditions including aflutter, CAD/HTN, COPD/interstitial lung disease, MGUS, Waldenstrom macroglobulinemia, iron deficiency anemia; depression, chronic pain o IDA/MGUS, macroglobulinemia: infusion and appointment w/ Dr. Grayland Lopez tomorrow; continues Venofer for IDA o COPD: Breo 100/25 BID, Ventolin PRN; noting that he is using Ventolin 2-10 times daily. Endorses worsening SOB over the past few weeks. - Previously followed w/ Dr. Alva Lopez, has not had f/u since he left the practice (last appt 04/2018). Last PFT showed FEV1/FVC 47%. Previous trials of Incruse (patient denied benefit, though was still smoking at that time) and Spiriva (denied benefit, though cost was a concern at that point) o CAD/HTN/A flutter: Eliquis 5 mg BID, amlodipine 10 mg daily, metoprolol tartrate 100 mg BID, isosorbide mononitrate 30 mg QAM;  o Depression: sertraline 50 mg daily; o Chronic pain: Gabapentin 400 mg QAM, 400 mg midday, 800 mg QPM  Pharmacist Clinical Goal(s):  Marland Kitchen Over the next 90 days, patient will work with PharmD, CPhT, and providers to address needs related to medication access  and ensure optimized medication  Interventions: . Stop Breo. Start Trelegy 1 puff once daily. Sent prescription to [facility]. Will collaborate w/ CMA to update patient on this change and to ask if agreeable to pulmonary referral.   Patient Self Care  Activities:  . Self administers medications as prescribed  Please see past updates related to this goal by clicking on the "Past Updates" button in the selected goal         Patient verbalizes understanding of instructions provided today.   Plan:  - Will outreach patient as previously scheduled for f/u  Catie [name], PharmD, [place], [place] Pharmacist [place] [phone]

## 2019-10-20 ENCOUNTER — Telehealth: Payer: Self-pay | Admitting: Internal Medicine

## 2019-10-20 LAB — KAPPA/LAMBDA LIGHT CHAINS
Kappa free light chain: 75.3 mg/L — ABNORMAL HIGH (ref 3.3–19.4)
Kappa, lambda light chain ratio: 3.71 — ABNORMAL HIGH (ref 0.26–1.65)
Lambda free light chains: 20.3 mg/L (ref 5.7–26.3)

## 2019-10-20 LAB — PROTEIN ELECTROPHORESIS, SERUM
A/G Ratio: 0.7 (ref 0.7–1.7)
Albumin ELP: 3.4 g/dL (ref 2.9–4.4)
Alpha-1-Globulin: 0.3 g/dL (ref 0.0–0.4)
Alpha-2-Globulin: 0.6 g/dL (ref 0.4–1.0)
Beta Globulin: 1.6 g/dL — ABNORMAL HIGH (ref 0.7–1.3)
Gamma Globulin: 2.3 g/dL — ABNORMAL HIGH (ref 0.4–1.8)
Globulin, Total: 4.8 g/dL — ABNORMAL HIGH (ref 2.2–3.9)
Total Protein ELP: 8.2 g/dL (ref 6.0–8.5)

## 2019-10-20 LAB — IGG, IGA, IGM
IgA: 41 mg/dL — ABNORMAL LOW (ref 61–437)
IgG (Immunoglobin G), Serum: 486 mg/dL — ABNORMAL LOW (ref 603–1613)
IgM (Immunoglobulin M), Srm: 3755 mg/dL — ABNORMAL HIGH (ref 15–143)

## 2019-10-20 NOTE — Progress Notes (Signed)
Reviewed information.  Agree with plan.    Dr Myeshia Fojtik 

## 2019-10-20 NOTE — Telephone Encounter (Signed)
Has been seeing Catie.  Inhalers being adjusted.  He previously saw Dr Alva Garnet.  He has been having increased sob recently.  Would like to get him back in with pulmonary.  If agreeable let me know and I will place order for referral.

## 2019-10-20 NOTE — Telephone Encounter (Signed)
Patient is agreeable to pulmonary referral. OK to refer back to [facility].

## 2019-10-21 ENCOUNTER — Other Ambulatory Visit: Payer: Self-pay | Admitting: Internal Medicine

## 2019-10-21 DIAGNOSIS — R0602 Shortness of breath: Secondary | ICD-10-CM

## 2019-10-21 NOTE — Progress Notes (Signed)
Order placed for pulmonary referral.  

## 2019-10-22 ENCOUNTER — Encounter: Payer: Self-pay | Admitting: Internal Medicine

## 2019-10-22 ENCOUNTER — Other Ambulatory Visit: Payer: Self-pay | Admitting: Pharmacy Technician

## 2019-10-22 NOTE — Patient Outreach (Signed)
[facility]) Care Management  10/22/2019  [place] 1941-03-19 [id number]  Care coordination call placed to [employer] in regards to patient's application for [facility] and Proventil.  Spoke to Angie who informed they have not received the application that was mailed to them on 09/16/2019. Angie suggested trying back in the next week or so and they are having mail delays.  Will follow up with [employer] in 10-15 business days.  Anshul Meddings P. Loney Peto, [place]  [phone]

## 2019-10-29 DIAGNOSIS — K2100 Gastro-esophageal reflux disease with esophagitis, without bleeding: Secondary | ICD-10-CM | POA: Diagnosis not present

## 2019-10-29 DIAGNOSIS — K52831 Collagenous colitis: Secondary | ICD-10-CM | POA: Diagnosis not present

## 2019-11-03 ENCOUNTER — Other Ambulatory Visit: Payer: Self-pay | Admitting: Pharmacy Technician

## 2019-11-03 DIAGNOSIS — M67461 Ganglion, right knee: Secondary | ICD-10-CM | POA: Diagnosis not present

## 2019-11-03 NOTE — Patient Outreach (Signed)
[place] [facility]) Care Management  11/03/2019  [place] Jul 21, 1941 [id number]   Care coordination call placed to [employer] in regards to patient's application for [facility] and Proventil HFA.  Spoke to [place] who informed the application was received on 10/26/2019 and the attestation form was mailed to patient on 10/28/2019. She informed it can take up to 7-10 days for patient to receive attestation form. Once received back the determination process can continue. Will follow up with patient.  Unsuccessful outreach call placed to patient, HIPAA compliant message left. Was calling patient to inquire if he has received the attestation form and to notify him it was coming if not received.  Will route note to embedded [facility] RPh Catie [place] as FYI.  Will attempt another outreach call in 5-7 business days as the attestation form for the [facility] must be received by 11/17/2019 for him to be enrolled in the program due to [employer] spinning off the [place]. Troy Lopez, [place]  [phone]

## 2019-11-03 NOTE — Patient Outreach (Signed)
[facility]) Care Management  11/03/2019  Troy Lopez 1940/09/11 [id number]    Incoming call from patient regarding patient assistance application(s) for [facility] and Proventil with [employer] , HIPAA identifiers verified.   Patient informed he has not received a letter from [employer]. Informed patient when he receives the letter to call me and we can go over the letter and form together. Informed him that [employer] needs the information back by 11/17/2019. Patient verbalized understanding.  Follow up:  Will follow up with [employer]/patient as previously scheduled.  Seibert Keeter P. Gerrica Cygan, [place]  [phone]

## 2019-11-05 ENCOUNTER — Other Ambulatory Visit: Payer: Self-pay | Admitting: Pharmacy Technician

## 2019-11-05 NOTE — Patient Outreach (Signed)
[facility]) Care Management  11/05/2019  [place] 1941/02/10 [id number]    Incoming call from patient regarding patient assistance receipt of attestation form from [place] for [employer] and Proventil HFA., HIPAA identifiers verified.   Patient informed he received the attestation form. Patient was able to complete the form while on the phone. Patient was having trouble finding the return envelope to mail back the necessary information. Informed patient to mail the attestation form and application back to [employer] and the address was listed at the bottom of the attestation form. Patient verbalized understanding and informed he would place in mail today as the form needs to be received back by 11/17/2019 for patient to receive the [facility] as [employer] is spinning off that medication from their program.  Follow up:  Will follow up with [employer] in 5-7 business days to inquire if they have received the attestation form.  Troy Lopez, [place]  [phone]

## 2019-11-11 ENCOUNTER — Other Ambulatory Visit: Payer: Self-pay | Admitting: Pharmacy Technician

## 2019-11-11 NOTE — Patient Outreach (Signed)
[place] [facility]) Care Management  11/11/2019  Troy Lopez 10/21/1940 [id number]    Care coordination call placed to [employer] in regards to patient's application for [facility] and Proventil HFA.  Spoke to April who informed patient APPROVED 11/10/2019-08/18/2020. She informed it takes 2-3 business days for the pharmacy to receive the order and an additional 7-10 business days for the medication to arrive at patient's home.  Will follow up with patient in 10-14 business days to confirm receipt of medication.  Adelise Buswell P. Rhilyn Battle, [place]  [phone]

## 2019-11-15 ENCOUNTER — Other Ambulatory Visit: Payer: Self-pay | Admitting: Internal Medicine

## 2019-11-16 ENCOUNTER — Other Ambulatory Visit: Payer: Self-pay | Admitting: Pharmacy Technician

## 2019-11-16 NOTE — Patient Outreach (Signed)
[facility] [facility]) Care Management  11/16/2019  Troy Lopez [birth year] [id number]    Return call placed to patient regarding patient assistance receipt of attestation form from [place] for [facility] and Proventil HFA, HIPAA identifiers verified.   Patient had left me a message informing he received another attestation form from [employer]. Spoke to patient and informed he could disregard that attestation form as it is a duplicate due to having mailed his application twice to [employer]. Informed patient he was approved on 11/10/2019 and that his medicaiton should be arriving in the next 7-10 business days. Patient verbalized understanding.  Follow up:  Will follow up with patient as previously scheduled.  Eames Dibiasio P. Elyzabeth Goatley, [place]  [phone]

## 2019-11-18 ENCOUNTER — Other Ambulatory Visit: Payer: Self-pay | Admitting: Pharmacy Technician

## 2019-11-18 NOTE — Patient Outreach (Signed)
[place] [facility]) Care Management  11/18/2019  [place] [birth year] [id number]   Care coordination call placed to patient in regards to patient's application for [facility] and Proventil HFA.  Spoke to [place] who informed the order was in process but could not provide any additional information.  Spoke to [place] at [facility] [employer] who informed they will fill the prescription today and will have medication ship out on 11/19/2019 with an expected delivery to patient's home in the next7-10 business days.  Will follow up with patient in 10-15 business days to confirm receipt of medication and to discuss refill procedure.  Jondavid Schreier P. Airen Dales, [place]  [phone]

## 2019-11-24 ENCOUNTER — Encounter: Payer: Self-pay | Admitting: Primary Care

## 2019-11-24 ENCOUNTER — Ambulatory Visit (INDEPENDENT_AMBULATORY_CARE_PROVIDER_SITE_OTHER): Payer: PPO | Admitting: Primary Care

## 2019-11-24 ENCOUNTER — Other Ambulatory Visit: Payer: Self-pay

## 2019-11-24 DIAGNOSIS — J441 Chronic obstructive pulmonary disease with (acute) exacerbation: Secondary | ICD-10-CM | POA: Diagnosis not present

## 2019-11-24 DIAGNOSIS — R0982 Postnasal drip: Secondary | ICD-10-CM | POA: Diagnosis not present

## 2019-11-24 DIAGNOSIS — J849 Interstitial pulmonary disease, unspecified: Secondary | ICD-10-CM

## 2019-11-24 MED ORDER — AZELASTINE HCL 0.1 % NA SOLN: 1.0000 | Freq: Two times a day (BID) | NASAL | 1 refills | Status: DC

## 2019-11-24 MED ORDER — TRELEGY ELLIPTA 100-62.5-25 MCG/INH IN AEPB: 1.0000 | INHALATION_SPRAY | Freq: Every day | RESPIRATORY_TRACT | 0 refills | Status: DC

## 2019-11-24 MED ORDER — FLUTICASONE PROPIONATE 50 MCG/ACT NA SUSP: 1.0000 | Freq: Every day | NASAL | 2 refills | Status: AC

## 2019-11-24 MED ORDER — PREDNISONE 10 MG PO TABS: ORAL_TABLET | ORAL | 0 refills | Status: DC

## 2019-11-24 NOTE — Patient Instructions (Addendum)
Continue Trelegy one puff once daily Adding Astelin nasal spray twice daily Continue Flonase once daily Stop mucinex unless you have a congested cough   Follow-up:  6 months with [place] office with new MD

## 2019-11-24 NOTE — Assessment & Plan Note (Addendum)
-   Increased dyspnea x1 month, treated for acute COPD exacerbation. Possible progression of ILD. Patient wanted to hold off on CT imaging or PFTs at this time. He is agreeing to prednisone taper and following up in 6 months. He will follow up sooner if dyspnea or cough progress.  - Last CT chest in November 2020 showed COPD and areas of fibrosis in the lungs were stable  - Recommend repeat CT chest due November 2021  - FU in 6 months

## 2019-11-24 NOTE — Assessment & Plan Note (Signed)
-   Mild exacerbation vs worsening disease - Breo recently changed to Trelegy 100  - Rx prednisone taper (40mg  x 2 days; 30mg  x 2 days; 20mg  x 2 days; 10mg  x 2 days) - No s/s bacterial infection, abx not indicated at this time

## 2019-11-24 NOTE — Assessment & Plan Note (Signed)
-   Triggering upper airway cough  - RX astelin nasal spray BID/ flonase qd

## 2019-11-24 NOTE — Progress Notes (Signed)
Reviewed and agree with assessment/plan.   Merle Whitehorn, MD [place] [facility]Critical [facility] 08/14/2016, 12:24 PM Pager:  [phone]  

## 2019-11-24 NOTE — Progress Notes (Signed)
@Patient  ID: Troy Lopez, male    DOB: [birth year], 79 y.o.   MRN: [id number]  Chief Complaint  Patient presents with  . Follow-up    Patient feels like his breathing has got worse since first of the year. Patient states that some days he has trouble breathing the whole day but most of the time with any exertion. Patient was last seen in 2019.     Referring provider: Einar Pheasant, MD  HPI: 79 year old male, former smoker.  Past medical history significant for moderate COPD, emphysema, ILD, sleep apnea, GERD, A. fib, hypertension, coronary artery disease, pulmonary hypertension, thoracic aortic aneurysm.  Dr. Alva Garnet, last seen September 2019. Maintained on Breo Ellipta 100. CT chest in 2018 showed COPD with progressive interstitial lung disease in upper lobes bilaterally.   11/24/2019 Patient presents today for 32-month follow-up. Feels his breathing has been worse over the last month. Since his last visit he was prescribed Trelegy by Dr. Nicki Reaper. He reports noticing a very subtle improvement in his breathing since starting. Experiences occasional wheezing. Uses albuterol inhaler up to 6 times a day, states that it varies day to day. Cough is no worse. Cough is non-productive and appears more upper airway. He has a runny nose first thing in the morning.    Pulmonary function testing: 04/16/18 PFTs: FVC: 4.37 > 4.61 L (105 > 111 %pred), FEV1: 2.07 > 2.08 L (64 > 64 %pred), FEV1/FVC: 47%, TLC: 7.19 L (108 %pred), DLCO 42 %pred, DLCO/VA 51% predicted  Imaging: 03/26/17 CT chest: Emphysematous and bronchitic changes consistent with COPD with progressive interstitial lung disease changes at the upper lobes bilaterally  CXR 8/29: Patchy chronic interstitial prominence.  No acute findings.  No significant change from previously  07/08/19- CT chest wo contrast showed Moderate emphysema. Areas of coarsened interstitial thickening in the lungs bilaterally compatible with areas of fibrosis. This  is similar prior study. No acute confluent opacities or effusions.  Allergies  Allergen Reactions  . 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 Influenza Split 05/22/2009, 07/17/2011, 06/02/2014  . Influenza, High Dose Seasonal PF 04/19/2016, 04/30/2017, 07/07/2018, 07/14/2019  . Influenza,inj,Quad PF,6+ Mos 05/26/2015  . PFIZER SARS-COV-2 Vaccination 08/24/2019, 09/14/2019  . Pneumococcal Conjugate-13 08/22/2017  . Pneumococcal Polysaccharide-23 06/18/2004, 09/09/2012  . Tdap 11/03/2018    Past Medical History:  Diagnosis Date  . Adrenal gland anomaly    enlargement  . Anxiety   . CAD (coronary artery disease)   . Carpal tunnel syndrome   . Chronic hyponatremia   . Colitis   . Colonic polyp   . COPD (chronic obstructive pulmonary disease) ([place])   . Degenerative disc disease, lumbar   . Depression   . Diverticulosis   . Dyspnea   . GERD (gastroesophageal reflux disease)   . H/O degenerative disc disease   . Hypercholesterolemia   . Hyperkalemia   . Hyperlipidemia   . Hypertension   . Irritable bowel syndrome   . Monoclonal gammopathy   . Monoclonal gammopathy   . Neuropathy   . Personal history of tobacco use, presenting hazards to health 08/17/2015  . Sleep apnea   . Vertebral compression fracture (HCC)     Tobacco History: Social History   Tobacco Use  Smoking Status Former Smoker  . Types: Cigarettes  . Quit date:  11/01/2015  . Years since quitting: 4.0  Smokeless Tobacco Never Used  Tobacco Comment   about 2 cigarettes per day, trying to quit   Counseling given: Not Answered Comment: about 2 cigarettes per day, trying to quit   Outpatient Medications Prior to Visit  Medication Sig Dispense Refill  . acetaminophen (TYLENOL) 500  MG tablet Take 500-1,000 mg by mouth 3 (three) times daily as needed for moderate pain or headache.    . albuterol (VENTOLIN HFA) 108 (90 Base) MCG/ACT inhaler Inhale 2 puffs into the lungs every 6 (six) hours as needed for wheezing or shortness of breath. 54 g 3  . amLODipine (NORVASC) 10 MG tablet Take 1 tablet (10 mg total) by mouth daily. 90 tablet 1  . budesonide (ENTOCORT EC) 3 MG 24 hr capsule Take 9 mg by mouth daily as needed (colitis flare).     . calcium-vitamin D (OSCAL WITH D) 500-200 MG-UNIT tablet Take 2 tablets by mouth daily.    Marland Kitchen ELIQUIS 5 MG TABS tablet Take 5 mg by mouth 2 (two) times daily.     Marland Kitchen FIBER PO Take 1 capsule by mouth daily.     . Fluticasone-Umeclidin-Vilant (TRELEGY ELLIPTA) 100-62.5-25 MCG/INH AEPB Inhale 1 puff into the lungs daily. 1 each 2  . furosemide (LASIX) 20 MG tablet Take 1 tablet (20 mg total) by mouth daily as needed. 30 tablet 0  . gabapentin (NEURONTIN) 100 MG capsule Take 4 capsules (400 mg) daily in the morning and midday. Take 8 capsules (800 mg) daily in the evening. 480 capsule 0  . isosorbide mononitrate (IMDUR) 30 MG 24 hr tablet Take 1 tablet (30 mg total) by mouth once daily.    . metoprolol tartrate (LOPRESSOR) 100 MG tablet Take 1 tablet (100 mg total) by mouth 2 (two) times daily. (Patient taking differently: 50 mg. Patient is taking 50mg  twice a day) 60 tablet 0  . pantoprazole (PROTONIX) 40 MG tablet Take 1 tablet (40 mg total) by mouth 2 (two) times daily. 180 tablet 0  . sertraline (ZOLOFT) 50 MG tablet Take 1 tablet (50 mg total) by mouth daily. 30 tablet 0  . triamcinolone ointment (KENALOG) 0.1 %     . vitamin B-12 (CYANOCOBALAMIN) 1000 MCG tablet Take 1,000 mcg by mouth daily.     Marland Kitchen azelastine (ASTELIN) 0.1 % nasal spray Place 1 spray into both nostrils 2 (two) times daily. Use in each nostril as directed (Patient not taking: Reported on 11/24/2019) 30 mL 1   No facility-administered medications prior to visit.   Review of  Systems  Review of Systems  Constitutional: Negative.   HENT: Positive for congestion and postnasal drip.   Respiratory: Positive for cough, shortness of breath and wheezing.   Cardiovascular: Negative.    Physical Exam  BP 122/70 (BP Location: Left Arm, Patient Position: Sitting, Cuff Size: Normal)   Pulse (!) 53   Temp (!) 97.3 F (36.3 C) (Temporal)   Ht 5\' 10"  (1.778 m)   Wt 183 lb 12.8 oz (83.4 kg)   SpO2 94%   BMI 26.37 kg/m  Physical Exam   Lab Results:  CBC    Component Value Date/Time   WBC 7.3 10/19/2019 1249   RBC 3.45 (L) 10/19/2019 1249   HGB 12.1 (L) 10/19/2019 1249   HGB 14.5 06/07/2014 0837   HCT 37.0 (L) 10/19/2019 1249   HCT 41.1 06/07/2014 0837   PLT 326 10/19/2019 1249   PLT 410 06/07/2014 0837   MCV 107.2 (  H) 10/19/2019 1249   MCV 102 (H) 06/07/2014 0837   MCH 35.1 (H) 10/19/2019 1249   MCHC 32.7 10/19/2019 1249   RDW 14.7 10/19/2019 1249   RDW 13.6 06/07/2014 0837   LYMPHSABS 1.3 10/19/2019 1249   LYMPHSABS 1.8 06/07/2014 0837   MONOABS 0.7 10/19/2019 1249   MONOABS 0.7 06/07/2014 0837   EOSABS 0.0 10/19/2019 1249   EOSABS 0.0 06/07/2014 0837   BASOSABS 0.0 10/19/2019 1249   BASOSABS 0.1 06/07/2014 0837    BMET    Component Value Date/Time   NA 130 (L) 10/19/2019 1249   NA 130 (L) 01/23/2013 0447   K 4.4 10/19/2019 1249   K 4.4 01/23/2013 0447   CL 95 (L) 10/19/2019 1249   CL 100 01/23/2013 0447   CO2 23 10/19/2019 1249   CO2 23 01/23/2013 0447   GLUCOSE 88 10/19/2019 1249   GLUCOSE 91 01/23/2013 0447   BUN 14 10/19/2019 1249   BUN 13 01/23/2013 0447   CREATININE 1.10 10/19/2019 1249   CREATININE 1.15 04/22/2014 1115   CALCIUM 9.8 10/19/2019 1249   CALCIUM 8.6 01/23/2013 0447   GFRNONAA >60 10/19/2019 1249   GFRNONAA >60 04/22/2014 1115   GFRAA >60 10/19/2019 1249   GFRAA >60 04/22/2014 1115    BNP No results found for: BNP  ProBNP No results found for: PROBNP  Imaging: No results found.   Assessment & Plan:    ILD (interstitial lung disease) ([place]) - Increased dyspnea x1 month, treated for acute COPD exacerbation. Possible progression of ILD. Patient wanted to hold off on CT imaging or PFTs at this time. He is agreeing to prednisone taper and following up in 6 months. He will follow up sooner if dyspnea or cough progress.  - Last CT chest in November 2020 showed COPD and areas of fibrosis in the lungs were stable  - Recommend repeat CT chest due November 2021  - FU in 6 months   COPD exacerbation ([place]) - Mild exacerbation vs worsening disease - Breo recently changed to Trelegy 100  - Rx prednisone taper (40mg  x 2 days; 30mg  x 2 days; 20mg  x 2 days; 10mg  x 2 days) - No s/s bacterial infection, abx not indicated at this time  Post-nasal drip - Triggering upper airway cough  - RX astelin nasal spray BID/ flonase qd   >25 mins spent face to face with patient   [name], NP 11/24/2019

## 2019-11-29 ENCOUNTER — Encounter: Payer: Self-pay | Admitting: Pharmacist

## 2019-11-29 ENCOUNTER — Ambulatory Visit (INDEPENDENT_AMBULATORY_CARE_PROVIDER_SITE_OTHER): Payer: PPO | Admitting: Pharmacist

## 2019-11-29 DIAGNOSIS — I4892 Unspecified atrial flutter: Secondary | ICD-10-CM

## 2019-11-29 DIAGNOSIS — J449 Chronic obstructive pulmonary disease, unspecified: Secondary | ICD-10-CM

## 2019-11-29 NOTE — Chronic Care Management (AMB) (Signed)
Chronic Care Management   Follow Up Note   11/29/2019 Name: Troy Lopez MRN: [id number] DOB: 1941/06/20  Referred by: Einar Pheasant, MD Reason for referral : Chronic Care Management (Medication Management)   Troy Lopez is a 79 y.o. year old male who is a primary care patient of Einar Pheasant, MD. The CCM team was consulted for assistance with chronic disease management and care coordination needs.    Contacted patient for medication management.   Review of patient status, including review of consultants reports, relevant laboratory and other test results, and collaboration with appropriate care team members and the patient's provider was performed as part of comprehensive patient evaluation and provision of chronic care management services.    SDOH (Social Determinants of Health) assessments performed: No See Care Plan activities for detailed interventions related to [facility])     Outpatient Encounter Medications as of 11/29/2019  Medication Sig Note  . albuterol (VENTOLIN HFA) 108 (90 Base) MCG/ACT inhaler Inhale 2 puffs into the lungs every 6 (six) hours as needed for wheezing or shortness of breath. 10/18/2019: Using 2-10 times daily   . amLODipine (NORVASC) 10 MG tablet Take 1 tablet (10 mg total) by mouth daily.   Marland Kitchen azelastine (ASTELIN) 0.1 % nasal spray Place 1 spray into both nostrils 2 (two) times daily. Use in each nostril as directed   . ELIQUIS 5 MG TABS tablet Take 5 mg by mouth 2 (two) times daily.    . fluticasone (FLONASE) 50 MCG/ACT nasal spray Place 1 spray into both nostrils daily.   . Fluticasone-Umeclidin-Vilant (TRELEGY ELLIPTA) 100-62.5-25 MCG/INH AEPB Inhale 1 puff into the lungs daily.   . furosemide (LASIX) 20 MG tablet Take 1 tablet (20 mg total) by mouth daily as needed. 10/18/2019: PRN  . gabapentin (NEURONTIN) 100 MG capsule Take 4 capsules (400 mg) daily in the morning and midday. Take 8 capsules (800 mg) daily in the evening.   . isosorbide  mononitrate (IMDUR) 30 MG 24 hr tablet Take 1 tablet (30 mg total) by mouth once daily.   . metoprolol tartrate (LOPRESSOR) 100 MG tablet Take 1 tablet (100 mg total) by mouth 2 (two) times daily. (Patient taking differently: 50 mg. Patient is taking 50mg  twice a day)   . pantoprazole (PROTONIX) 40 MG tablet Take 1 tablet (40 mg total) by mouth 2 (two) times daily.   . predniSONE (DELTASONE) 10 MG tablet Take 4 tabs po daily x 2 days; then 3 tabs for 2 days; then 2 tabs for 2 days; then 1 tab for 2 days   . sertraline (ZOLOFT) 50 MG tablet Take 1 tablet (50 mg total) by mouth daily.   Marland Kitchen acetaminophen (TYLENOL) 500 MG tablet Take 500-1,000 mg by mouth 3 (three) times daily as needed for moderate pain or headache. 02/22/2019: 2 tablets 3 times daily   . budesonide (ENTOCORT EC) 3 MG 24 hr capsule Take 9 mg by mouth daily as needed (colitis flare).    . calcium-vitamin D (OSCAL WITH D) 500-200 MG-UNIT tablet Take 2 tablets by mouth daily.   Marland Kitchen FIBER PO Take 1 capsule by mouth daily.    . Fluticasone-Umeclidin-Vilant (TRELEGY ELLIPTA) 100-62.5-25 MCG/INH AEPB Inhale 1 puff into the lungs daily.   Marland Kitchen triamcinolone ointment (KENALOG) 0.1 %    . vitamin B-12 (CYANOCOBALAMIN) 1000 MCG tablet Take 1,000 mcg by mouth daily.     No facility-administered encounter medications on file as of 11/29/2019.     Objective:   Goals Addressed  This Visit's Progress     Patient Stated   . PharmD "I want to stay healthy (pt-stated)       CARE PLAN ENTRY (see longtitudinal plan of care for additional care plan information)  Current Barriers:  . Financial Barriers - received Eliquis and Breo/Ventolin through patient assistance programs through 08/19/19. Now on Trelegy. Does not yet qualify for patient assistance for these medications.  . Polypharmacy; complex patient with multiple medical conditions including aflutter, CAD/HTN, COPD/interstitial lung disease, MGUS, Waldenstrom macroglobulinemia, iron  deficiency anemia; depression, chronic pain . Does report a sore throat that started yesterday, is worse this morning and talking is uncomfortable.  o IDA/MGUS, macroglobulinemia; Dr. Grayland Ormond. Last visit 10/2019 o COPD: Trelegy daily, Ventolin PRN. Re-established w/ pulmonary NP Volanda Napoleon last week. Given prednisone taper, continued Trelegy w/ PRN Ventolin. Patient reports that he felt his breathing got worse initially w/ prednisone taper, but then improved over this past week.  o CAD/HTN/A flutter: Eliquis 5 mg BID, amlodipine 10 mg daily, metoprolol tartrate 100 mg BID, isosorbide mononitrate 30 mg QAM;  o Depression: sertraline 50 mg daily o GERD: pantoprazole 40 mg BID o Chronic pain: Gabapentin 400 mg QAM, 400 mg midday, 800 mg QPM  Pharmacist Clinical Goal(s):  Marland Kitchen Over the next 90 days, patient will work with PharmD, CPhT, and providers to address needs related to medication access  and ensure optimized medication  Interventions: . Comprehensive medication review, medication list updated in electronic medical record.  . Discussed if sore throat related to allergies/pollen. Patient is unsure. Encouraged that if it worsens/does not improve, to consider COVID test and contact the office for further evaluation. Will notify Dr. Nicki Reaper.  . Discussed patient assistance programs. Reviewed that Eliquis program requires patient to spend 3% of total household income to qualify, but that the company sometimes gives a credit. Encouraged to contact me if he thinks he approaches this spend total. Discussed [place] assistance - patient needs to spend $600 out of pocket before qualifying for [place] for Trelegy and Ventolin.   Patient Self Care Activities:  . Self administers medications as prescribed  Please see past updates related to this goal by clicking on the "Past Updates" button in the selected goal          Plan:  - Scheduled f/u call 03/06/20  Catie [name], PharmD, BCACP, CPP Clinical  Pharmacist [place] [phone]

## 2019-11-29 NOTE — Patient Instructions (Signed)
Visit Information  Goals Addressed            This Visit's Progress     Patient Stated   . PharmD "I want to stay healthy (pt-stated)       CARE PLAN ENTRY (see longtitudinal plan of care for additional care plan information)  Current Barriers:  . Financial Barriers - received Eliquis and Breo/Ventolin through patient assistance programs through 08/19/19. Now on Trelegy. Does not yet qualify for patient assistance for these medications.  . Polypharmacy; complex patient with multiple medical conditions including aflutter, CAD/HTN, COPD/interstitial lung disease, MGUS, Waldenstrom macroglobulinemia, iron deficiency anemia; depression, chronic pain . Does report a sore throat that started yesterday, is worse this morning and talking is uncomfortable.  o IDA/MGUS, macroglobulinemia; Dr. Grayland Ormond. Last visit 10/2019 o COPD: Trelegy daily, Ventolin PRN. Re-established w/ pulmonary NP Volanda Napoleon last week. Given prednisone taper, continued Trelegy w/ PRN Ventolin. Patient reports that he felt his breathing got worse initially w/ prednisone taper, but then improved over this past week.  o CAD/HTN/A flutter: Eliquis 5 mg BID, amlodipine 10 mg daily, metoprolol tartrate 100 mg BID, isosorbide mononitrate 30 mg QAM;  o Depression: sertraline 50 mg daily o GERD: pantoprazole 40 mg BID o Chronic pain: Gabapentin 400 mg QAM, 400 mg midday, 800 mg QPM  Pharmacist Clinical Goal(s):  Marland Kitchen Over the next 90 days, patient will work with PharmD, CPhT, and providers to address needs related to medication access  and ensure optimized medication  Interventions: . Comprehensive medication review, medication list updated in electronic medical record.  . Discussed if sore throat related to allergies/pollen. Patient is unsure. Encouraged that if it worsens/does not improve, to consider COVID test and contact the office for further evaluation. Will notify Dr. Nicki Reaper.  . Discussed patient assistance programs. Reviewed that  Eliquis program requires patient to spend 3% of total household income to qualify, but that the company sometimes gives a credit. Encouraged to contact me if he thinks he approaches this spend total. Discussed [place] assistance - patient needs to spend $600 out of pocket before qualifying for [place] for Trelegy and Ventolin.   Patient Self Care Activities:  . Self administers medications as prescribed  Please see past updates related to this goal by clicking on the "Past Updates" button in the selected goal         Patient verbalizes understanding of instructions provided today.   Plan:  - Scheduled f/u call 03/06/20  Catie [name], PharmD, BCACP, CPP Clinical Pharmacist [place] [phone]

## 2019-11-30 ENCOUNTER — Telehealth: Payer: Self-pay | Admitting: Internal Medicine

## 2019-11-30 NOTE — Telephone Encounter (Signed)
Called patient to clarify. Patient stated that the sore throat lasted for about 1 day but is better now.

## 2019-11-30 NOTE — Progress Notes (Signed)
Reviewed information.  Agree with plan.  Will have nurse contact pt regarding sore throat.    Dr Nicki Reaper

## 2019-11-30 NOTE — Telephone Encounter (Signed)
Reviewed Troy Lopez's note. Pt reporting sore throat.  Please call and confirm his current symptoms.

## 2019-12-02 ENCOUNTER — Ambulatory Visit (INDEPENDENT_AMBULATORY_CARE_PROVIDER_SITE_OTHER): Payer: PPO | Admitting: Adult Health

## 2019-12-02 ENCOUNTER — Encounter: Payer: Self-pay | Admitting: Adult Health

## 2019-12-02 ENCOUNTER — Telehealth: Payer: Self-pay | Admitting: Primary Care

## 2019-12-02 DIAGNOSIS — J441 Chronic obstructive pulmonary disease with (acute) exacerbation: Secondary | ICD-10-CM

## 2019-12-02 DIAGNOSIS — J849 Interstitial pulmonary disease, unspecified: Secondary | ICD-10-CM | POA: Diagnosis not present

## 2019-12-02 NOTE — Progress Notes (Signed)
Virtual Visit via Telephone Note  I connected with Troy Lopez on 12/02/19 at  3:00 PM EDT by telephone and verified that I am speaking with the correct person using two identifiers.  Location: Patient: Home  Provider: Home    I discussed the limitations, risks, security and privacy concerns of performing an evaluation and management service by telephone and the availability of in person appointments. I also discussed with the patient that there may be a patient responsible charge related to this service. The patient expressed understanding and agreed to proceed.   History of Present Illness: 79 year old male former smoker followed for COPD, emphysema, interstitial lung disease Medical history significant for sleep apnea, GERD, A. fib, coronary artery disease, pulmonary hypertension, thoracic aortic aneurysm Today's televisit is for a follow-up of COPD.  Patient was seen 1 week ago for a COPD flare.  Was given a prednisone taper.  Patient says that he really does not feel a whole lot different feels that his breathing has been getting progressively worse over the last 4 months.  He gets short of breath with minimal activity.  Has decreased activity tolerance.  He does have a daily cough which has been about the same.  He denies any discolored mucus fever.  Patient denies any chest pain orthopnea PND or increased leg swelling. He remains on Trelegy inhaler daily.  Observations/Objective:  04/16/18 PFTs:FVC:4.37 > 4.61L (105 > 111%pred), FEV1: 2.07 > 2.08L (64 > 64%pred), FEV1/FVC: 47%, TLC:7.19L (108%pred), DLCO 42%pred, DLCO/VA 51% predicted  Imaging: 03/26/17 CT chest:Emphysematous and bronchitic changes consistent with COPD with progressive interstitial lung disease changes at the upper lobes bilaterally  CXR8/29:Patchy chronic interstitial prominence. No acute findings. No significant change from previously  07/08/19- CT chest wo contrast showed Moderate emphysema.  Areas of coarsened interstitial thickening in the lungs bilaterally compatible with areas of fibrosis. This is similar prior study. No acute confluent opacities or effusions.  Assessment and Plan: COPD slow to resolve exacerbation-patient has increased symptom burden.  Will need to check labs and chest x-ray.  Set up for overnight oximetry test.  Interstitial lung disease-possible progressive interstitial lung disease.  Set up for chest x-ray.  Recent CT chest in November 2020 showed stable fibrosis areas  Plan  . Patient Instructions  Return for labs and Chest Xray .  Set up for overnight oximetry test. Set up for PFTs.   Continue on TRELEGY 1 puff daily  Albuterol inhaler As needed   Follow up in office in [place] in 2 -3 weeks and As needed   Please contact office for sooner follow up if symptoms do not improve or worsen or seek emergency care      Follow Up Instructions: Follow-up in 2 to 3 weeks and as needed   I discussed the assessment and treatment plan with the patient. The patient was provided an opportunity to ask questions and all were answered. The patient agreed with the plan and demonstrated an understanding of the instructions.   The patient was advised to call back or seek an in-person evaluation if the symptoms worsen or if the condition fails to improve as anticipated.  I provided 22 minutes of non-face-to-face time during this encounter Rexene Edison, NP

## 2019-12-02 NOTE — Patient Instructions (Signed)
Return for labs and Chest Xray .  Set up for overnight oximetry test. Set up for PFTs.   Continue on TRELEGY 1 puff daily  Albuterol inhaler As needed   Follow up in office in [place] in 2 -3 weeks and As needed   Please contact office for sooner follow up if symptoms do not improve or worsen or seek emergency care

## 2019-12-02 NOTE — Telephone Encounter (Signed)
Called and spoke with pt who stated he finished pred about 1-2 days ago and pt stated that he is still having problems with his SOB. Pt stated usually with prednisone, he can see an improvement with his breathing but this time it did not seem to help.  Stated to pt that we should schedule him for a televisit to further evaluate and he verbalized understanding. Pt scheduled for televisit with TP today at 3pm. Nothing further needed.

## 2019-12-03 ENCOUNTER — Other Ambulatory Visit: Payer: Self-pay | Admitting: Pharmacy Technician

## 2019-12-03 NOTE — Addendum Note (Signed)
Addended by: Desmond Dike C on: 12/03/2019 04:21 PM   Modules accepted: Orders

## 2019-12-03 NOTE — Addendum Note (Signed)
Addended by: Desmond Dike C on: 12/03/2019 03:33 PM   Modules accepted: Orders

## 2019-12-03 NOTE — Patient Outreach (Signed)
[facility]) Care Management  12/03/2019  Troy Lopez 1941/02/15 [id number]   Successful call placed to patient regarding patient assistance application(s) for [facility] and Proventil HFA , HIPAA identifiers verified.   Patient informed he received the [facility] and Proventil in the mail from [employer]. Informed patient to put the [facility] to the side since that medication was discontinued and he was placed on Trelegy.  Informed patient how to obtain his refills on the Proventil HFA which requires the patient to call [facility]. Inofrmed patient where to find the phone number for that pharmacy on the prescription label on the inhaler box.  Patient inquired if [employer] makes a product similar to Trelegy that he could obtain thru patient assistance. Informed patient that [employer] does not have a product similir to Trelegy. Informed patient that [employer], clinical ([place]) has a patient assistance company for the Trelegy but it would require the patient to spend $600 OOP to be eligible. Patient informed it "would be a while before I reached that." Informed patient to stay in touch with his providers if the Trelegy becomes cost prohibitive.   Patient verbalized understanding of all the above information. Confirmed patient had name and number as he had no other questions.  Follow up:  Will route note to embedded [facility] RPh Catie [name] for case closure and will remove myself from care team as patient assistance is completed.  [name], [place]  [phone]

## 2019-12-06 ENCOUNTER — Other Ambulatory Visit
Admission: RE | Admit: 2019-12-06 | Discharge: 2019-12-06 | Disposition: A | Discharge status: Home / Self Care | Payer: PPO | Source: Ambulatory Visit | Attending: Student | Admitting: Student

## 2019-12-06 DIAGNOSIS — R062 Wheezing: Secondary | ICD-10-CM | POA: Diagnosis not present

## 2019-12-06 DIAGNOSIS — J42 Unspecified chronic bronchitis: Secondary | ICD-10-CM | POA: Diagnosis not present

## 2019-12-06 DIAGNOSIS — R0602 Shortness of breath: Secondary | ICD-10-CM | POA: Diagnosis not present

## 2019-12-06 DIAGNOSIS — J441 Chronic obstructive pulmonary disease with (acute) exacerbation: Secondary | ICD-10-CM | POA: Diagnosis not present

## 2019-12-06 LAB — BRAIN NATRIURETIC PEPTIDE: B Natriuretic Peptide: 519 pg/mL — ABNORMAL HIGH (ref 0.0–100.0)

## 2019-12-07 ENCOUNTER — Telehealth: Payer: Self-pay | Admitting: Internal Medicine

## 2019-12-07 NOTE — Telephone Encounter (Signed)
Agree with obtaining records and have him keep [place] posted on how he is doing.

## 2019-12-07 NOTE — Telephone Encounter (Signed)
Spoke with pt. Confirmed ok. Pt stated [facility] walk in was requesting f/u labs and possible f/u cxr with pcp. Patient stated he does not need appt right now. Advised he should let his pulmonologist know in case need for f/u. Pt requested to have referral to pulmonary at [facility]. I have requested records from acute care to determine what they are recommending him do

## 2019-12-07 NOTE — Telephone Encounter (Signed)
Noted. Waiting for records.

## 2019-12-07 NOTE — Telephone Encounter (Signed)
Pt went to Urgent care yesterday and they want him to be seen by Dr Nicki Reaper next week but there are not appts avail-Please call back to advise. He went for COPD and possible pneumonia

## 2019-12-09 DIAGNOSIS — I10 Essential (primary) hypertension: Secondary | ICD-10-CM | POA: Diagnosis not present

## 2019-12-09 DIAGNOSIS — I272 Pulmonary hypertension, unspecified: Secondary | ICD-10-CM | POA: Diagnosis not present

## 2019-12-09 DIAGNOSIS — I2510 Atherosclerotic heart disease of native coronary artery without angina pectoris: Secondary | ICD-10-CM | POA: Diagnosis not present

## 2019-12-09 DIAGNOSIS — R0602 Shortness of breath: Secondary | ICD-10-CM | POA: Diagnosis not present

## 2019-12-09 DIAGNOSIS — G4733 Obstructive sleep apnea (adult) (pediatric): Secondary | ICD-10-CM | POA: Diagnosis not present

## 2019-12-09 DIAGNOSIS — Z720 Tobacco use: Secondary | ICD-10-CM | POA: Diagnosis not present

## 2019-12-09 DIAGNOSIS — J410 Simple chronic bronchitis: Secondary | ICD-10-CM | POA: Diagnosis not present

## 2019-12-09 DIAGNOSIS — R079 Chest pain, unspecified: Secondary | ICD-10-CM | POA: Diagnosis not present

## 2019-12-09 DIAGNOSIS — Z9889 Other specified postprocedural states: Secondary | ICD-10-CM | POA: Diagnosis not present

## 2019-12-09 DIAGNOSIS — E7800 Pure hypercholesterolemia, unspecified: Secondary | ICD-10-CM | POA: Diagnosis not present

## 2019-12-14 DIAGNOSIS — R609 Edema, unspecified: Secondary | ICD-10-CM | POA: Diagnosis not present

## 2019-12-14 DIAGNOSIS — J449 Chronic obstructive pulmonary disease, unspecified: Secondary | ICD-10-CM | POA: Diagnosis not present

## 2019-12-14 DIAGNOSIS — R0600 Dyspnea, unspecified: Secondary | ICD-10-CM | POA: Diagnosis not present

## 2019-12-14 DIAGNOSIS — R05 Cough: Secondary | ICD-10-CM | POA: Diagnosis not present

## 2019-12-18 NOTE — Progress Notes (Signed)
[place]  [phone] [phone]  ID: Duane Lope Limones OB: 1941/03/06  MR#: [id number]  GYK#:[id number]  Patient Care Team: Einar Pheasant, MD as PCP - General (Internal Medicine) Lollie Sails, MD (Inactive) as Consulting Physician (Gastroenterology) Ok Edwards, NP as Nurse Practitioner (Gastroenterology) Lloyd Huger, MD as Consulting Physician (Oncology)  CHIEF COMPLAINT: [name]'s macroglobulinemia, iron deficiency anemia.  INTERVAL HISTORY: Patient returns to clinic today for repeat laboratory work, further evaluation, and consideration of additional IV Feraheme.  He has noticed significant increased weakness and fatigue over the past several weeks.  He otherwise feels well. He has no neurologic complaints.  He denies any fevers.  He has a good appetite and denies weight loss.  He denies any chest pain, shortness of breath, cough, or hemoptysis.  He denies any nausea, vomiting, constipation, or diarrhea.  He denies any melena or hematochezia.  He has no urinary complaints.  Patient offers no further specific complaints today.  REVIEW OF SYSTEMS:   Review of Systems  Constitutional: Positive for malaise/fatigue. Negative for fever and weight loss.  Respiratory: Negative.  Negative for cough, hemoptysis and shortness of breath.   Cardiovascular: Negative.  Negative for chest pain and leg swelling.  Gastrointestinal: Negative.  Negative for abdominal pain, blood in stool, diarrhea and melena.  Genitourinary: Negative.  Negative for hematuria.  Musculoskeletal: Negative.  Negative for back pain.  Skin: Negative.  Negative for rash.  Neurological: Positive for weakness. Negative for dizziness, sensory change, focal weakness and headaches.  Psychiatric/Behavioral: Negative.  The patient is not nervous/anxious.     As per HPI. Otherwise, a complete review of systems is negative.  PAST MEDICAL HISTORY: Past Medical  History:  Diagnosis Date  . Adrenal gland anomaly    enlargement  . Anxiety   . CAD (coronary artery disease)   . Carpal tunnel syndrome   . Chronic hyponatremia   . Colitis   . Colonic polyp   . COPD (chronic obstructive pulmonary disease) ([place])   . Degenerative disc disease, lumbar   . Depression   . Diverticulosis   . Dyspnea   . GERD (gastroesophageal reflux disease)   . H/O degenerative disc disease   . Hypercholesterolemia   . Hyperkalemia   . Hyperlipidemia   . Hypertension   . Irritable bowel syndrome   . Monoclonal gammopathy   . Monoclonal gammopathy   . Neuropathy   . Personal history of tobacco use, presenting hazards to health 08/17/2015  . Sleep apnea   . Vertebral compression fracture ([place])     PAST SURGICAL HISTORY: Past Surgical History:  Procedure Laterality Date  . BUNIONECTOMY  1989  . CARPAL TUNNEL RELEASE Left 2011   ulnar nerve sub muscular at elbow  . CHOLECYSTECTOMY  09/07  . COLONOSCOPY WITH PROPOFOL N/A 03/05/2017   Procedure: COLONOSCOPY WITH PROPOFOL;  Surgeon: Lucilla Lame, MD;  Location: [facility] ENDOSCOPY;  Service: Endoscopy;  Laterality: N/A;  . ELECTROPHYSIOLOGIC STUDY N/A 11/15/2015   Procedure: CARDIOVERSION;  Surgeon: Isaias Cowman, MD;  Location: [facility] ORS;  Service: Cardiovascular;  Laterality: N/A;  . ESOPHAGOGASTRODUODENOSCOPY (EGD) WITH PROPOFOL N/A 03/05/2017   Procedure: ESOPHAGOGASTRODUODENOSCOPY (EGD) WITH PROPOFOL;  Surgeon: Lucilla Lame, MD;  Location: [facility] ENDOSCOPY;  Service: Endoscopy;  Laterality: N/A;  . ESOPHAGOGASTRODUODENOSCOPY (EGD) WITH PROPOFOL N/A 10/17/2017   Procedure: ESOPHAGOGASTRODUODENOSCOPY (EGD) WITH PROPOFOL;  Surgeon: Lollie Sails, MD;  Location: [facility] ENDOSCOPY;  Service: Endoscopy;  Laterality: N/A;  . EYE SURGERY Bilateral 2010   cataract  .  HEMORRHOID SURGERY    . KYPHOPLASTY N/A 11/04/2016   Procedure: KYPHOPLASTY T 12;  Surgeon: Hessie Knows, MD;  Location: [facility] ORS;  Service: Orthopedics;   Laterality: N/A;    FAMILY HISTORY Family History  Problem Relation Age of Onset  . Stroke Mother   . Heart disease Father        MI - 62   . Colon cancer Neg Hx   . Prostate cancer Neg Hx        ADVANCED DIRECTIVES:    HEALTH MAINTENANCE: Social History   Tobacco Use  . Smoking status: Former Smoker    Types: Cigarettes    Quit date: 11/01/2015    Years since quitting: 4.1  . Smokeless tobacco: Never Used  . Tobacco comment: about 2 cigarettes per day, trying to quit  Substance Use Topics  . Alcohol use: Yes    Alcohol/week: 42.0 standard drinks    Types: 42 Cans of beer per week    Comment: occas  . Drug use: No     Colonoscopy:  PAP:  Bone density:  Lipid panel:  Allergies  Allergen Reactions  . Iodinated Diagnostic Agents Other (See Comments)    Contraindication secondary to IGMgammopathy/Waldren's syndrome   Not to be given due to Waldenstrom's syndrome, told it could affect kidney function    Current Outpatient Medications  Medication Sig Dispense Refill  . acetaminophen (TYLENOL) 500 MG tablet Take 500-1,000 mg by mouth 3 (three) times daily as needed for moderate pain or headache.    . albuterol (VENTOLIN HFA) 108 (90 Base) MCG/ACT inhaler Inhale 2 puffs into the lungs every 6 (six) hours as needed for wheezing or shortness of breath. 54 g 3  . amLODipine (NORVASC) 10 MG tablet Take 1 tablet (10 mg total) by mouth daily. 90 tablet 1  . azelastine (ASTELIN) 0.1 % nasal spray Place 1 spray into both nostrils 2 (two) times daily. Use in each nostril as directed 30 mL 1  . budesonide (ENTOCORT EC) 3 MG 24 hr capsule Take 9 mg by mouth daily as needed (colitis flare).     . calcium-vitamin D (OSCAL WITH D) 500-200 MG-UNIT tablet Take 2 tablets by mouth daily.    Marland Kitchen ELIQUIS 5 MG TABS tablet Take 5 mg by mouth 2 (two) times daily.     Marland Kitchen FIBER PO Take 1 capsule by mouth daily.     . fluticasone (FLONASE) 50 MCG/ACT nasal spray Place 1 spray into both nostrils  daily. 16 g 2  . Fluticasone-Umeclidin-Vilant (TRELEGY ELLIPTA) 100-62.5-25 MCG/INH AEPB Inhale 1 puff into the lungs daily. 1 each 2  . Fluticasone-Umeclidin-Vilant (TRELEGY ELLIPTA) 100-62.5-25 MCG/INH AEPB Inhale 1 puff into the lungs daily. 28 each 0  . furosemide (LASIX) 20 MG tablet Take 1 tablet (20 mg total) by mouth daily as needed. 30 tablet 0  . gabapentin (NEURONTIN) 100 MG capsule Take 4 capsules (400 mg) daily in the morning and midday. Take 8 capsules (800 mg) daily in the evening. 480 capsule 0  . isosorbide mononitrate (IMDUR) 30 MG 24 hr tablet Take 1 tablet (30 mg total) by mouth once daily.    . metoprolol tartrate (LOPRESSOR) 100 MG tablet Take 1 tablet (100 mg total) by mouth 2 (two) times daily. (Patient taking differently: 50 mg. Patient is taking 12m twice a day) 60 tablet 0  . pantoprazole (PROTONIX) 40 MG tablet Take 1 tablet (40 mg total) by mouth 2 (two) times daily. 180 tablet 0  . predniSONE (  DELTASONE) 10 MG tablet Take 4 tabs po daily x 2 days; then 3 tabs for 2 days; then 2 tabs for 2 days; then 1 tab for 2 days 20 tablet 0  . sertraline (ZOLOFT) 50 MG tablet Take 1 tablet (50 mg total) by mouth daily. 30 tablet 0  . triamcinolone ointment (KENALOG) 0.1 %     . vitamin B-12 (CYANOCOBALAMIN) 1000 MCG tablet Take 1,000 mcg by mouth daily.      No current facility-administered medications for this visit.   Facility-Administered Medications Ordered in Other Visits  Medication Dose Route Frequency Provider Last Rate Last Admin  . 0.9 %  sodium chloride infusion   Intravenous Once Lloyd Huger, MD      . ferumoxytol [facility]) 510 mg in sodium chloride 0.9 % 100 mL IVPB  510 mg Intravenous Once Lloyd Huger, MD        OBJECTIVE: Vitals:   12/21/19 1328 12/21/19 1329  BP: (!) 89/52 101/64  Pulse: (!) 58   Resp: 18   Temp: (!) 96.5 F (35.8 C)   SpO2: 99%      Body mass index is 26.46 kg/m.    ECOG FS:0 - Asymptomatic  General: Well-developed,  well-nourished, no acute distress.  Sitting in a wheelchair. Eyes: Pink conjunctiva, anicteric sclera. HEENT: Normocephalic, moist mucous membranes. Lungs: No audible wheezing or coughing. Heart: Regular rate and rhythm. Abdomen: Soft, nontender, no obvious distention. Musculoskeletal: No edema, cyanosis, or clubbing. Neuro: Alert, answering all questions appropriately. Cranial nerves grossly intact. Skin: No rashes or petechiae noted. Psych: Normal affect.  LAB RESULTS:  Lab Results  Component Value Date   NA 130 (L) 10/19/2019   K 4.4 10/19/2019   CL 95 (L) 10/19/2019   CO2 23 10/19/2019   GLUCOSE 88 10/19/2019   BUN 14 10/19/2019   CREATININE 1.10 10/19/2019   CALCIUM 9.8 10/19/2019   PROT 8.2 11/16/2018   ALBUMIN 3.6 11/16/2018   AST 16 11/16/2018   ALT 11 11/16/2018   ALKPHOS 71 11/16/2018   BILITOT 0.3 11/16/2018   GFRNONAA >60 10/19/2019   GFRAA >60 10/19/2019    Lab Results  Component Value Date   WBC 10.9 (H) 12/21/2019   NEUTROABS 9.3 (H) 12/21/2019   HGB 8.0 (L) 12/21/2019   HCT 25.6 (L) 12/21/2019   MCV 105.3 (H) 12/21/2019   PLT 390 12/21/2019   Lab Results  Component Value Date   IRON 151 10/19/2019   TIBC 1,012 (H) 10/19/2019   IRONPCTSAT 15 (L) 10/19/2019    Lab Results  Component Value Date   FERRITIN 31 10/19/2019   Lab Results  Component Value Date   TOTALPROTELP 8.2 10/19/2019   ALBUMINELP 3.4 10/19/2019   A1GS 0.3 10/19/2019   A2GS 0.6 10/19/2019   BETS 1.6 (H) 10/19/2019   BETA2SER 0.2 02/14/2016   GAMS 2.3 (H) 10/19/2019   MSPIKE Not Observed 10/19/2019   SPEI Comment 10/19/2019    STUDIES: No results found.  ASSESSMENT: [name]'s macroglobulinemia, iron deficiency anemia.  PLAN:    1. [name]'s macroglobulinemia:  Bone marrow biopsy results from Jan 08, 2017 reviewed independently confirming underlying [name]'s.  Patient's M spike has been erratic ranging from 1.1 in May 2018 to as high as 2.6 in October  2018.  His most recent results on October 19, 2019 did not reveal an M spike, but continued to have a persistently elevated IgM level of 3755.  Kappa free light chains are essentially unchanged at 75.3. He has persistent  iron deficiency anemia from a GI source, but no other evidence of endorgan damage. He does not require any treatment at this time. If he had progression of disease as evidenced by B-symptoms or endorgan damage, will consider Rituxan-based therapy.  Continue to monitor every 3 to 6 months. 2. 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  Patient's hemoglobin has dropped significantly and he is symptomatic.  Iron stores are pending at time of dictation.  Proceed with 510 mg IV Feraheme today.  Return to clinic in 1 week for second infusion.  Patient will then return to clinic in 2 months with repeat laboratory work, further evaluation, and continuation of treatment if needed.   3.  Back pain: Chronic and unchanged.  Continue monitoring and treatment per pain clinic.  I spent a total of 30 minutes reviewing chart data, face-to-face evaluation with the patient, counseling and coordination of care as detailed above.   Patient expressed understanding and was in agreement with this plan. He also understands that He can call clinic at any time with any questions, concerns, or complaints.    Lloyd Huger, MD   12/21/2019 1:46 PM

## 2019-12-20 ENCOUNTER — Other Ambulatory Visit: Payer: Self-pay

## 2019-12-20 ENCOUNTER — Other Ambulatory Visit: Payer: Self-pay | Admitting: Internal Medicine

## 2019-12-20 ENCOUNTER — Encounter: Payer: Self-pay | Admitting: Oncology

## 2019-12-20 NOTE — Progress Notes (Signed)
Patient prescreened for appointment. Patient has no concerns or questions.  

## 2019-12-21 ENCOUNTER — Encounter: Payer: Self-pay | Admitting: Oncology

## 2019-12-21 ENCOUNTER — Inpatient Hospital Stay: Payer: PPO | Attending: Oncology

## 2019-12-21 ENCOUNTER — Inpatient Hospital Stay (HOSPITAL_BASED_OUTPATIENT_CLINIC_OR_DEPARTMENT_OTHER): Payer: PPO | Admitting: Oncology

## 2019-12-21 ENCOUNTER — Inpatient Hospital Stay: Payer: PPO

## 2019-12-21 VITALS — BP 101/64 | HR 58 | Temp 96.5°F | Resp 18 | Wt 184.4 lb

## 2019-12-21 DIAGNOSIS — C880 Waldenstrom macroglobulinemia: Secondary | ICD-10-CM | POA: Diagnosis not present

## 2019-12-21 DIAGNOSIS — J449 Chronic obstructive pulmonary disease, unspecified: Secondary | ICD-10-CM | POA: Diagnosis not present

## 2019-12-21 DIAGNOSIS — Z86010 Personal history of colonic polyps: Secondary | ICD-10-CM | POA: Diagnosis not present

## 2019-12-21 DIAGNOSIS — D472 Monoclonal gammopathy: Secondary | ICD-10-CM | POA: Insufficient documentation

## 2019-12-21 DIAGNOSIS — D509 Iron deficiency anemia, unspecified: Secondary | ICD-10-CM | POA: Insufficient documentation

## 2019-12-21 DIAGNOSIS — I2510 Atherosclerotic heart disease of native coronary artery without angina pectoris: Secondary | ICD-10-CM | POA: Diagnosis not present

## 2019-12-21 DIAGNOSIS — R531 Weakness: Secondary | ICD-10-CM | POA: Diagnosis not present

## 2019-12-21 DIAGNOSIS — K219 Gastro-esophageal reflux disease without esophagitis: Secondary | ICD-10-CM | POA: Diagnosis not present

## 2019-12-21 DIAGNOSIS — I10 Essential (primary) hypertension: Secondary | ICD-10-CM | POA: Diagnosis not present

## 2019-12-21 DIAGNOSIS — F418 Other specified anxiety disorders: Secondary | ICD-10-CM | POA: Diagnosis not present

## 2019-12-21 DIAGNOSIS — M5136 Other intervertebral disc degeneration, lumbar region: Secondary | ICD-10-CM | POA: Insufficient documentation

## 2019-12-21 DIAGNOSIS — E785 Hyperlipidemia, unspecified: Secondary | ICD-10-CM | POA: Diagnosis not present

## 2019-12-21 DIAGNOSIS — Z7901 Long term (current) use of anticoagulants: Secondary | ICD-10-CM | POA: Insufficient documentation

## 2019-12-21 DIAGNOSIS — R5383 Other fatigue: Secondary | ICD-10-CM | POA: Diagnosis not present

## 2019-12-21 DIAGNOSIS — Z79899 Other long term (current) drug therapy: Secondary | ICD-10-CM | POA: Diagnosis not present

## 2019-12-21 DIAGNOSIS — E7800 Pure hypercholesterolemia, unspecified: Secondary | ICD-10-CM | POA: Diagnosis not present

## 2019-12-21 DIAGNOSIS — Z87891 Personal history of nicotine dependence: Secondary | ICD-10-CM | POA: Insufficient documentation

## 2019-12-21 LAB — IRON AND TIBC
Iron: 69 ug/dL (ref 45–182)
Saturation Ratios: 7 % — ABNORMAL LOW (ref 17.9–39.5)
TIBC: 1007 ug/dL — ABNORMAL HIGH (ref 250–450)
UIBC: 938 ug/dL

## 2019-12-21 LAB — CBC WITH DIFFERENTIAL/PLATELET
Abs Immature Granulocytes: 0.08 10*3/uL — ABNORMAL HIGH (ref 0.00–0.07)
Basophils Absolute: 0 10*3/uL (ref 0.0–0.1)
Basophils Relative: 0 %
Eosinophils Absolute: 0 10*3/uL (ref 0.0–0.5)
Eosinophils Relative: 0 %
HCT: 25.6 % — ABNORMAL LOW (ref 39.0–52.0)
Hemoglobin: 8 g/dL — ABNORMAL LOW (ref 13.0–17.0)
Immature Granulocytes: 1 %
Lymphocytes Relative: 7 %
Lymphs Abs: 0.8 10*3/uL (ref 0.7–4.0)
MCH: 32.9 pg (ref 26.0–34.0)
MCHC: 31.3 g/dL (ref 30.0–36.0)
MCV: 105.3 fL — ABNORMAL HIGH (ref 80.0–100.0)
Monocytes Absolute: 0.7 10*3/uL (ref 0.1–1.0)
Monocytes Relative: 7 %
Neutro Abs: 9.3 10*3/uL — ABNORMAL HIGH (ref 1.7–7.7)
Neutrophils Relative %: 85 %
Platelets: 390 10*3/uL (ref 150–400)
RBC: 2.43 MIL/uL — ABNORMAL LOW (ref 4.22–5.81)
RDW: 14.5 % (ref 11.5–15.5)
WBC: 10.9 10*3/uL — ABNORMAL HIGH (ref 4.0–10.5)
nRBC: 0 % (ref 0.0–0.2)

## 2019-12-21 LAB — FERRITIN: Ferritin: 22 ng/mL — ABNORMAL LOW (ref 24–336)

## 2019-12-21 MED ORDER — SODIUM CHLORIDE 0.9 % IV SOLN
Freq: Once | INTRAVENOUS | Status: AC
  Filled 2019-12-21: qty 250

## 2019-12-21 MED ORDER — SODIUM CHLORIDE 0.9 % IV SOLN
510.0000 mg | Freq: Once | INTRAVENOUS | Status: AC
  Administered 2019-12-21: 14:00:00 510 mg via INTRAVENOUS
  Filled 2019-12-21: qty 510

## 2019-12-21 NOTE — Progress Notes (Signed)
Patient here today for follow up. Reports he woke up feeling very weak in both arms and legs. He states "just don't feel good". His BP in left arm 89/52 and right arm 101/64. He states he felt fine yesterday but is having extreme weakness today.

## 2019-12-28 ENCOUNTER — Inpatient Hospital Stay: Payer: PPO

## 2019-12-28 ENCOUNTER — Other Ambulatory Visit: Payer: Self-pay

## 2019-12-28 VITALS — BP 128/61 | HR 76 | Temp 96.5°F | Resp 16

## 2019-12-28 DIAGNOSIS — D509 Iron deficiency anemia, unspecified: Secondary | ICD-10-CM

## 2019-12-28 DIAGNOSIS — Z95828 Presence of other vascular implants and grafts: Secondary | ICD-10-CM

## 2019-12-28 MED ORDER — SODIUM CHLORIDE 0.9 % IV SOLN
Freq: Once | INTRAVENOUS | Status: AC
  Filled 2019-12-28: qty 250

## 2019-12-28 MED ORDER — SODIUM CHLORIDE 0.9 % IV SOLN
510.0000 mg | Freq: Once | INTRAVENOUS | Status: AC
  Administered 2019-12-28: 510 mg via INTRAVENOUS
  Filled 2019-12-28: qty 510

## 2020-01-04 ENCOUNTER — Ambulatory Visit (INDEPENDENT_AMBULATORY_CARE_PROVIDER_SITE_OTHER): Payer: PPO | Admitting: Internal Medicine

## 2020-01-04 ENCOUNTER — Other Ambulatory Visit: Payer: Self-pay

## 2020-01-04 ENCOUNTER — Encounter: Payer: Self-pay | Admitting: Internal Medicine

## 2020-01-04 DIAGNOSIS — I10 Essential (primary) hypertension: Secondary | ICD-10-CM

## 2020-01-04 DIAGNOSIS — J449 Chronic obstructive pulmonary disease, unspecified: Secondary | ICD-10-CM

## 2020-01-04 DIAGNOSIS — F32A Depression, unspecified: Secondary | ICD-10-CM

## 2020-01-04 DIAGNOSIS — I712 Thoracic aortic aneurysm, without rupture, unspecified: Secondary | ICD-10-CM

## 2020-01-04 DIAGNOSIS — G479 Sleep disorder, unspecified: Secondary | ICD-10-CM | POA: Diagnosis not present

## 2020-01-04 DIAGNOSIS — I4892 Unspecified atrial flutter: Secondary | ICD-10-CM

## 2020-01-04 DIAGNOSIS — D472 Monoclonal gammopathy: Secondary | ICD-10-CM | POA: Diagnosis not present

## 2020-01-04 DIAGNOSIS — I272 Pulmonary hypertension, unspecified: Secondary | ICD-10-CM

## 2020-01-04 DIAGNOSIS — C880 Waldenstrom macroglobulinemia: Secondary | ICD-10-CM

## 2020-01-04 DIAGNOSIS — J849 Interstitial pulmonary disease, unspecified: Secondary | ICD-10-CM | POA: Diagnosis not present

## 2020-01-04 DIAGNOSIS — D509 Iron deficiency anemia, unspecified: Secondary | ICD-10-CM

## 2020-01-04 DIAGNOSIS — R0981 Nasal congestion: Secondary | ICD-10-CM | POA: Diagnosis not present

## 2020-01-04 DIAGNOSIS — F320 Major depressive disorder, single episode, mild: Secondary | ICD-10-CM | POA: Diagnosis not present

## 2020-01-04 NOTE — Patient Instructions (Signed)
flonase nasal spray - 2 sprays each nostril one time per day.  Do this in the evening.    astelin nasal spray - 1 spray each nostril 2x/day  Delsym - one teaspoon twice a day as needed for cough.    Melatonin 3mg  - take one tablet before bed  Decrease alcohol intake.

## 2020-01-04 NOTE — Progress Notes (Signed)
Patient ID: Troy Lopez, male   DOB: 09-29-1940, 79 y.o.   MRN: [id number]   Subjective:    Patient ID: Troy Lopez, male    DOB: [birth year], 79 y.o.   MRN: [id number]  HPI This visit occurred during the SARS-CoV-2 public health emergency.  Safety protocols were in place, including screening questions prior to the visit, additional usage of staff PPE, and extensive cleaning of exam room while observing appropriate contact time as indicated for disinfecting solutions.  Patient here for a scheduled follow up.  Has severe COPD, mild fibrosis and ex smoker.  Also known history of IDA, hypertension and CAD.  Saw Dr Raul Del 12/14/19 - recommended trelegy, albuterol prn, prednisone and mucnex.  Recommended f/u in 4-6 weeks with PFTs.  He stopped prednisone.  Felt might be contributing to keeping him awake.  Some persistent cough.  Not using rescue inhaler regularly.  Sleep is an issue for him.  States over the last week, has not been sleeping as well.  We have discussed issues in the past.  Discussed sleep apnea.  In process of being evaluated.  No chest pain.  Has had problems with low blood pressure.  Taking 1/2 amlodipine.  This am blood pressure in the [id number] systolic.  Still with fatigue.  Seeing Dr Grayland Ormond for IDA.  Receiving iron infusion.  Also sees cardiology - last - 12/09/19.  Planning for echo.  Eating.  No nausea or vomiting.  No diarrhea reported.  Increased alcohol intake. States may be drinking 10 beers per day - to help him sleep.  Discussed the need to decrease alcohol intake.    Past Medical History:  Diagnosis Date  . Adrenal gland anomaly    enlargement  . Anxiety   . CAD (coronary artery disease)   . Carpal tunnel syndrome   . Chronic hyponatremia   . Colitis   . Colonic polyp   . COPD (chronic obstructive pulmonary disease) ([place])   . Degenerative disc disease, lumbar   . Depression   . Diverticulosis   . Dyspnea   . GERD (gastroesophageal reflux disease)   . H/O  degenerative disc disease   . Hypercholesterolemia   . Hyperkalemia   . Hyperlipidemia   . Hypertension   . Irritable bowel syndrome   . Monoclonal gammopathy   . Monoclonal gammopathy   . Neuropathy   . Personal history of tobacco use, presenting hazards to health 08/17/2015  . Sleep apnea   . Vertebral compression fracture [facility])    Past Surgical History:  Procedure Laterality Date  . BUNIONECTOMY  1989  . CARPAL TUNNEL RELEASE Left 2011   ulnar nerve sub muscular at elbow  . CHOLECYSTECTOMY  09/07  . COLONOSCOPY WITH PROPOFOL N/A 03/05/2017   Procedure: COLONOSCOPY WITH PROPOFOL;  Surgeon: Lucilla Lame, MD;  Location: [facility] ENDOSCOPY;  Service: Endoscopy;  Laterality: N/A;  . ELECTROPHYSIOLOGIC STUDY N/A 11/15/2015   Procedure: CARDIOVERSION;  Surgeon: Isaias Cowman, MD;  Location: [facility] ORS;  Service: Cardiovascular;  Laterality: N/A;  . ESOPHAGOGASTRODUODENOSCOPY (EGD) WITH PROPOFOL N/A 03/05/2017   Procedure: ESOPHAGOGASTRODUODENOSCOPY (EGD) WITH PROPOFOL;  Surgeon: Lucilla Lame, MD;  Location: [facility] ENDOSCOPY;  Service: Endoscopy;  Laterality: N/A;  . ESOPHAGOGASTRODUODENOSCOPY (EGD) WITH PROPOFOL N/A 10/17/2017   Procedure: ESOPHAGOGASTRODUODENOSCOPY (EGD) WITH PROPOFOL;  Surgeon: Lollie Sails, MD;  Location[facility] ENDOSCOPY;  Service: Endoscopy;  Laterality: N/A;  . EYE SURGERY Bilateral 2010   cataract  . HEMORRHOID SURGERY    . KYPHOPLASTY N/A 11/04/2016  Procedure: KYPHOPLASTY T 12;  Surgeon: Hessie Knows, MD;  Location: [facility] ORS;  Service: Orthopedics;  Laterality: N/A;   Family History  Problem Relation Age of Onset  . Stroke Mother   . Heart disease Father        MI - 56   . Colon cancer Neg Hx   . Prostate cancer Neg Hx    Social History   Socioeconomic History  . Marital status: Married    Spouse name: Not on file  . Number of children: 3  . Years of education: Not on file  . Highest education level: Not on file  Occupational History  . Not on file   Tobacco Use  . Smoking status: Former Smoker    Packs/day: 2.00    Years: 58.00    Pack years: 116.00    Types: Cigarettes    Start date: 08/19/1957    Quit date: 11/01/2015    Years since quitting: 4.2  . Smokeless tobacco: Never Used  Substance and Sexual Activity  . Alcohol use: Yes    Alcohol/week: 42.0 standard drinks    Types: 42 Cans of beer per week    Comment: occas  . Drug use: No  . Sexual activity: Not on file  Other Topics Concern  . Not on file  Social History Narrative  . Not on file   Social Determinants of Health   Financial Resource Strain:   . Difficulty of Paying Living Expenses:   Food Insecurity:   . Worried About Charity fundraiser in the Last Year:   . Arboriculturist in the Last Year:   Transportation Needs:   . Film/video editor (Medical):   Marland Kitchen Lack of Transportation (Non-Medical):   Physical Activity:   . Days of Exercise per Week:   . Minutes of Exercise per Session:   Stress:   . Feeling of Stress :   Social Connections:   . Frequency of Communication with Friends and Family:   . Frequency of Social Gatherings with Friends and Family:   . Attends Religious Services:   . Active Member of Clubs or Organizations:   . Attends Archivist Meetings:   Marland Kitchen Marital Status:     Outpatient Encounter Medications as of 01/04/2020  Medication Sig  . acetaminophen (TYLENOL) 500 MG tablet Take 500-1,000 mg by mouth 3 (three) times daily as needed for moderate pain or headache.  . albuterol (VENTOLIN HFA) 108 (90 Base) MCG/ACT inhaler Inhale 2 puffs into the lungs every 6 (six) hours as needed for wheezing or shortness of breath.  Marland Kitchen amLODipine (NORVASC) 10 MG tablet Take 1 tablet (10 mg total) by mouth daily. (Patient taking differently: Take 5 mg by mouth daily. )  . azelastine (ASTELIN) 0.1 % nasal spray Place 1 spray into both nostrils 2 (two) times daily. Use in each nostril as directed  . budesonide (ENTOCORT EC) 3 MG 24 hr capsule Take  9 mg by mouth daily as needed (colitis flare).   . calcium-vitamin D (OSCAL WITH D) 500-200 MG-UNIT tablet Take 2 tablets by mouth daily.  Marland Kitchen ELIQUIS 5 MG TABS tablet Take 5 mg by mouth 2 (two) times daily.   Marland Kitchen FIBER PO Take 1 capsule by mouth daily.   . fluticasone (FLONASE) 50 MCG/ACT nasal spray Place 1 spray into both nostrils daily.  . Fluticasone-Umeclidin-Vilant (TRELEGY ELLIPTA) 100-62.5-25 MCG/INH AEPB Inhale 1 puff into the lungs daily.  . Fluticasone-Umeclidin-Vilant (TRELEGY ELLIPTA) 100-62.5-25 MCG/INH AEPB Inhale  1 puff into the lungs daily.  . furosemide (LASIX) 20 MG tablet Take 1 tablet (20 mg total) by mouth daily as needed. (Patient taking differently: 40 mg. )  . isosorbide mononitrate (IMDUR) 30 MG 24 hr tablet Take 1 tablet (30 mg total) by mouth once daily.  . metoprolol tartrate (LOPRESSOR) 100 MG tablet Take 1 tablet (100 mg total) by mouth 2 (two) times daily. (Patient taking differently: 50 mg. Patient is taking 50mg  twice a day)  . pantoprazole (PROTONIX) 40 MG tablet Take 1 tablet (40 mg total) by mouth 2 (two) times daily.  . sertraline (ZOLOFT) 50 MG tablet Take 1 tablet (50 mg total) by mouth daily.  Marland Kitchen triamcinolone ointment (KENALOG) 0.1 %   . vitamin B-12 (CYANOCOBALAMIN) 1000 MCG tablet Take 1,000 mcg by mouth daily.   . [DISCONTINUED] gabapentin (NEURONTIN) 100 MG capsule Take 4 capsules (400 mg) daily in the morning and midday. Take 8 capsules (800 mg) daily in the evening.  . [DISCONTINUED] predniSONE (DELTASONE) 10 MG tablet Take 4 tabs po daily x 2 days; then 3 tabs for 2 days; then 2 tabs for 2 days; then 1 tab for 2 days   No facility-administered encounter medications on file as of 01/04/2020.    Review of Systems  Constitutional: Positive for fatigue. Negative for appetite change and unexpected weight change.  HENT: Negative for congestion and sinus pressure.   Respiratory: Negative for cough, chest tightness and shortness of breath.   Cardiovascular:  Negative for chest pain, palpitations and leg swelling.  Gastrointestinal: Negative for abdominal pain, diarrhea and nausea.  Genitourinary: Negative for difficulty urinating and dysuria.  Musculoskeletal: Negative for joint swelling and myalgias.  Skin: Negative for color change and rash.  Neurological: Negative for dizziness, light-headedness and headaches.  Psychiatric/Behavioral: Positive for sleep disturbance. Negative for agitation and dysphoric mood.       Objective:    Physical Exam Vitals reviewed.  Constitutional:      General: He is not in acute distress.    Appearance: Normal appearance. He is well-developed.  HENT:     Head: Normocephalic and atraumatic.     Right Ear: External ear normal.     Left Ear: External ear normal.  Eyes:     General:        Right eye: No discharge.        Left eye: No discharge.     Conjunctiva/sclera: Conjunctivae normal.  Cardiovascular:     Rate and Rhythm: Normal rate and regular rhythm.  Pulmonary:     Effort: Pulmonary effort is normal. No respiratory distress.     Breath sounds: Normal breath sounds.  Abdominal:     General: Bowel sounds are normal.     Palpations: Abdomen is soft.     Tenderness: There is no abdominal tenderness.  Musculoskeletal:        General: No swelling or tenderness.     Cervical back: Neck supple. No rigidity or tenderness.  Skin:    Findings: No erythema or rash.  Neurological:     Mental Status: He is alert.  Psychiatric:        Mood and Affect: Mood normal.        Behavior: Behavior normal.     BP 130/68   Pulse 72   Temp (!) 97.2 F (36.2 C)   Resp 16   Ht 5\' 10"  (1.778 m)   Wt 180 lb (81.6 kg)   SpO2 96%   BMI 25.83 kg/m  Wt Readings from Last 3 Encounters:  01/06/20 174 lb 9.6 oz (79.2 kg)  01/04/20 180 lb (81.6 kg)  12/21/19 184 lb 6.4 oz (83.6 kg)     Lab Results  Component Value Date   WBC 9.8 01/06/2020   HGB 9.3 (L) 01/06/2020   HCT 29.2 (L) 01/06/2020   PLT 339  01/06/2020   GLUCOSE 109 (H) 01/06/2020   CHOL 136 11/16/2018   TRIG 48.0 11/16/2018   HDL 55.50 11/16/2018   LDLCALC 71 11/16/2018   ALT 11 01/06/2020   AST 18 01/06/2020   NA 128 (L) 01/06/2020   K 4.9 01/06/2020   CL 93 (L) 01/06/2020   CREATININE 1.16 01/06/2020   BUN 23 01/06/2020   CO2 23 01/06/2020   TSH 1.754 12/22/2018   PSA 1.79 12/17/2017   INR 1.11 01/08/2017   HGBA1C 5.1 11/16/2018   MICROALBUR 1.9 10/27/2017      Assessment & Plan:   Problem List Items Addressed This Visit    Atrial flutter ([place])    S/p cardioversion.  On eliquis.  Stable. Continue f/u with cardiology.       Hypertension    Blood pressure as outlined on 1/2 amlodipine.  Follow pressures.  Follow metabolic panel.       ILD (interstitial lung disease) ([place])    Due to f/u with pulmonary.  Planning for PFTs.        Iron deficiency anemia    Followed by hematology.  Receiving iron infusions.        MGUS (monoclonal gammopathy of unknown significance)    Followed by hematology.       Mild depression ([place])    On zoloft.  Stable.       Moderate COPD (chronic obstructive pulmonary disease) ([place])    Continue trelegy.  Use albuterol as needed.  Has mucinex to help with congestion and cough.  Prednisone 10mg  q day - per pulmonary.  Keep f/u with pulmonary.        Nasal congestion    Saline nasal spray and astelin nasal spray as directed.  Can continue mucinex.  Follow.        Pulmonary hypertension ([place])    Per pulmonary - breathing stable.       Sleep difficulties    Sleep an issue.  Discussed concern regarding sleep apnea.  Discussed concern regarding sleeping medication with untreated sleep apnea.  Melatonin.  Needs evaluation for sleep apnea.  Also discussed concern regarding mixing sleeping medication and increased alcohol.  Needs to decrease alcohol intake.  Follow.        Thoracic aortic aneurysm ([facility])    Has 4cm ascending thoracic aneurysm.  Recommended f/u chest CT in one  year.  Last documented 04/2018.  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           Einar Pheasant, MD

## 2020-01-06 ENCOUNTER — Ambulatory Visit
Admission: RE | Admit: 2020-01-06 | Discharge: 2020-01-06 | Disposition: A | Discharge status: Home / Self Care | Payer: PPO | Attending: Pulmonary Disease | Admitting: Pulmonary Disease

## 2020-01-06 ENCOUNTER — Ambulatory Visit: Payer: PPO | Admitting: Pulmonary Disease

## 2020-01-06 ENCOUNTER — Ambulatory Visit
Admission: RE | Admit: 2020-01-06 | Discharge: 2020-01-06 | Disposition: A | Discharge status: Home / Self Care | Payer: PPO | Source: Ambulatory Visit | Attending: Pulmonary Disease | Admitting: Pulmonary Disease

## 2020-01-06 ENCOUNTER — Encounter: Payer: Self-pay | Admitting: Pulmonary Disease

## 2020-01-06 ENCOUNTER — Other Ambulatory Visit: Payer: Self-pay

## 2020-01-06 ENCOUNTER — Other Ambulatory Visit
Admission: RE | Admit: 2020-01-06 | Discharge: 2020-01-06 | Disposition: A | Discharge status: Home / Self Care | Payer: PPO | Source: Home / Self Care | Attending: Pulmonary Disease | Admitting: Pulmonary Disease

## 2020-01-06 VITALS — BP 126/76 | HR 78 | Temp 97.5°F | Ht 70.0 in | Wt 174.6 lb

## 2020-01-06 DIAGNOSIS — R0602 Shortness of breath: Secondary | ICD-10-CM

## 2020-01-06 DIAGNOSIS — J449 Chronic obstructive pulmonary disease, unspecified: Secondary | ICD-10-CM

## 2020-01-06 DIAGNOSIS — I272 Pulmonary hypertension, unspecified: Secondary | ICD-10-CM

## 2020-01-06 DIAGNOSIS — G4730 Sleep apnea, unspecified: Secondary | ICD-10-CM

## 2020-01-06 DIAGNOSIS — J849 Interstitial pulmonary disease, unspecified: Secondary | ICD-10-CM | POA: Diagnosis not present

## 2020-01-06 DIAGNOSIS — R918 Other nonspecific abnormal finding of lung field: Secondary | ICD-10-CM | POA: Diagnosis not present

## 2020-01-06 LAB — CBC WITH DIFFERENTIAL/PLATELET
Abs Immature Granulocytes: 0.07 10*3/uL (ref 0.00–0.07)
Basophils Absolute: 0 10*3/uL (ref 0.0–0.1)
Basophils Relative: 0 %
Eosinophils Absolute: 0 10*3/uL (ref 0.0–0.5)
Eosinophils Relative: 0 %
HCT: 29.2 % — ABNORMAL LOW (ref 39.0–52.0)
Hemoglobin: 9.3 g/dL — ABNORMAL LOW (ref 13.0–17.0)
Immature Granulocytes: 1 %
Lymphocytes Relative: 11 %
Lymphs Abs: 1.1 10*3/uL (ref 0.7–4.0)
MCH: 36.3 pg — ABNORMAL HIGH (ref 26.0–34.0)
MCHC: 31.8 g/dL (ref 30.0–36.0)
MCV: 114.1 fL — ABNORMAL HIGH (ref 80.0–100.0)
Monocytes Absolute: 0.4 10*3/uL (ref 0.1–1.0)
Monocytes Relative: 4 %
Neutro Abs: 8.2 10*3/uL — ABNORMAL HIGH (ref 1.7–7.7)
Neutrophils Relative %: 84 %
Platelets: 339 10*3/uL (ref 150–400)
RBC: 2.56 MIL/uL — ABNORMAL LOW (ref 4.22–5.81)
RDW: 19.3 % — ABNORMAL HIGH (ref 11.5–15.5)
Smear Review: NORMAL
WBC: 9.8 10*3/uL (ref 4.0–10.5)
nRBC: 0 % (ref 0.0–0.2)

## 2020-01-06 LAB — COMPREHENSIVE METABOLIC PANEL
ALT: 11 U/L (ref 0–44)
AST: 18 U/L (ref 15–41)
Albumin: 3.6 g/dL (ref 3.5–5.0)
Alkaline Phosphatase: 52 U/L (ref 38–126)
Anion gap: 12 (ref 5–15)
BUN: 23 mg/dL (ref 8–23)
CO2: 23 mmol/L (ref 22–32)
Calcium: 9.6 mg/dL (ref 8.9–10.3)
Chloride: 93 mmol/L — ABNORMAL LOW (ref 98–111)
Creatinine, Ser: 1.16 mg/dL (ref 0.61–1.24)
GFR calc Af Amer: >60 mL/min (ref >60)
GFR calc non Af Amer: >60 mL/min (ref >60)
Glucose, Bld: 109 mg/dL — ABNORMAL HIGH (ref 70–99)
Potassium: 4.9 mmol/L (ref 3.5–5.1)
Sodium: 128 mmol/L — ABNORMAL LOW (ref 135–145)
Total Bilirubin: 0.8 mg/dL (ref 0.3–1.2)
Total Protein: 8.5 g/dL — ABNORMAL HIGH (ref 6.5–8.1)

## 2020-01-06 LAB — BRAIN NATRIURETIC PEPTIDE: B Natriuretic Peptide: 426.8 pg/mL — ABNORMAL HIGH (ref 0.0–100.0)

## 2020-01-06 NOTE — Progress Notes (Signed)
Pt sitting 02 was 98% pt 1st lap was 97% and pt second lap was 96%. Pt walked at a slow pace

## 2020-01-06 NOTE — Assessment & Plan Note (Signed)
Plan: Continue CPAP therapy 

## 2020-01-06 NOTE — Assessment & Plan Note (Signed)
Appears fairly euvolemic today Worsened dyspnea on exertion Most recent BNP 2 weeks ago was elevated  Plan: Lab work today Continue Lasix

## 2020-01-06 NOTE — Progress Notes (Signed)
@Patient  ID: Troy Lopez, male    DOB: [birth year], 79 y.o.   MRN: [id number]  Chief Complaint  Patient presents with  . Follow-up    doe, copd, ild    Referring provider: Einar Pheasant, MD  HPI:  79 year old male former smoker followed in our office for COPD as well as interstitial lung disease  PMH: Sleep apnea, CAD, hypertension, GERD, chronic pain, depression, history of alcohol abuse, pulmonary hypertension Smoker/ Smoking History: Former Smoker. Quit 2017. 116 pack year history.  Maintenance:  Prednisone 10mg , Trelegy Ellipta 100 Pt of: Dr. Alva Garnet  01/06/2020  - Visit   79 year old male former smoker followed in our office for COPD and interstitial lung disease.  Patient completing a 4-week follow-up from a virtual visit that he had with TP NP.  At that time he was requested to obtain pulmonary function testing as well as have a chest x-ray as well as follow-up lab work.  None of this was completed.  Patient reports confusion regarding pulmonary function testing scheduling.  He was told to present to the hospital which he did and they did not have any records.  Patient never was scheduled for PFT or Covid testing.  He is also unsure why he needs this.  We will discuss this today.  Patient then presented to urgent care at [facility] where he was referred to [facility] pulmonary.  He reports that was also a poor experience but he did have an office spirometry completed there.  He was started on daily prednisone.  He is unsure if this is helping.  Chart review reveals that most recent CT imaging was in November/2020 which showed emphysema as well as areas of fibrosis in the lungs that are stable.  Patient denies any significant chemical or manufacturing exposures.  Fibrosis was felt to be stable in comparison to prior studies.  Patient had 2019 PFTs that did show a diffusion defect.  Patient is maintained on Trelegy Ellipta but he is unsure if this is actually truly helping with  his breathing.  Chart review also reveals that patient's most recent lab work from earlier this month showed a drop in his hemoglobin to 8.  As well as an elevated BNP.  Patient is maintained on Lasix.  Patient last had follow-up with Dr. Nicki Reaper and primary care on 01/04/2020.  He is also followed by oncology by Dr. Grayland Ormond.  His last appointment with him was on 12/21/2019.   Questionaires / Pulmonary Flowsheets:   MMRC: mMRC Dyspnea Scale mMRC Score  01/06/2020 2    Tests:   04/16/18 PFTs:FVC:4.37 > 4.61L (105 > 111%pred), FEV1: 2.07 > 2.08L (64 > 64%pred), FEV1/FVC: 47%, TLC:7.19L (108%pred), DLCO 42%pred, DLCO/VA 51% predicted  Imaging: 03/26/17 CT chest:Emphysematous and bronchitic changes consistent with COPD with progressive interstitial lung disease changes at the upper lobes bilaterally  CXR8/29:Patchy chronic interstitial prominence. No acute findings. No significant change from previously  07/08/19- CT chest wo contrast showedModerate emphysema. Areas of coarsened interstitial thickening in the lungs bilaterally compatible with areas of fibrosis. This is similar prior study. No acute confluent opacities or effusions.  FENO:  No results found for: NITRICOXIDE  PFT: PFT Results Latest Ref Rng & Units 09/22/2015  FVC-Pre L 3.71  FVC-Predicted Pre % 83  FVC-Post L 3.63  FVC-Predicted Post % 81  Pre FEV1/FVC % % 57  Post FEV1/FCV % % 57  FEV1-Pre L 2.10  FEV1-Predicted Pre % 64  FEV1-Post L 2.08  DLCO UNC% % 41  DLCO COR %Predicted % 47  TLC L 6.53  TLC % Predicted % 90  RV % Predicted % 91    WALK:  SIX MIN WALK 10/09/2015  Supplimental Oxygen during Test? (L/min) No    Imaging: No results found.  Lab Results:  CBC    Component Value Date/Time   WBC 10.9 (H) 12/21/2019 1249   RBC 2.43 (L) 12/21/2019 1249   HGB 8.0 (L) 12/21/2019 1249   HGB 14.5 06/07/2014 0837   HCT 25.6 (L) 12/21/2019 1249   HCT 41.1 06/07/2014 0837   PLT 390  12/21/2019 1249   PLT 410 06/07/2014 0837   MCV 105.3 (H) 12/21/2019 1249   MCV 102 (H) 06/07/2014 0837   MCH 32.9 12/21/2019 1249   MCHC 31.3 12/21/2019 1249   RDW 14.5 12/21/2019 1249   RDW 13.6 06/07/2014 0837   LYMPHSABS 0.8 12/21/2019 1249   LYMPHSABS 1.8 06/07/2014 0837   MONOABS 0.7 12/21/2019 1249   MONOABS 0.7 06/07/2014 0837   EOSABS 0.0 12/21/2019 1249   EOSABS 0.0 06/07/2014 0837   BASOSABS 0.0 12/21/2019 1249   BASOSABS 0.1 06/07/2014 0837    BMET    Component Value Date/Time   NA 130 (L) 10/19/2019 1249   NA 130 (L) 01/23/2013 0447   K 4.4 10/19/2019 1249   K 4.4 01/23/2013 0447   CL 95 (L) 10/19/2019 1249   CL 100 01/23/2013 0447   CO2 23 10/19/2019 1249   CO2 23 01/23/2013 0447   GLUCOSE 88 10/19/2019 1249   GLUCOSE 91 01/23/2013 0447   BUN 14 10/19/2019 1249   BUN 13 01/23/2013 0447   CREATININE 1.10 10/19/2019 1249   CREATININE 1.15 04/22/2014 1115   CALCIUM 9.8 10/19/2019 1249   CALCIUM 8.6 01/23/2013 0447   GFRNONAA >60 10/19/2019 1249   GFRNONAA >60 04/22/2014 1115   GFRAA >60 10/19/2019 1249   GFRAA >60 04/22/2014 1115    BNP    Component Value Date/Time   BNP 519.0 (H) 12/06/2019 1253    ProBNP No results found for: PROBNP  Specialty Problems      Pulmonary Problems   Sleep apnea   Shortness of breath   COPD exacerbation (HCC)   Moderate COPD (chronic obstructive pulmonary disease) (HCC)   ILD (interstitial lung disease) (HCC)      Allergies  Allergen Reactions  . 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 Influenza Split 05/22/2009, 07/17/2011, 06/02/2014  . Influenza, High Dose Seasonal PF 04/19/2016, 04/30/2017, 07/07/2018, 07/14/2019  . Influenza,inj,Quad PF,6+ Mos 05/26/2015  . PFIZER SARS-COV-2  Vaccination 08/24/2019, 09/14/2019  . Pneumococcal Conjugate-13 08/22/2017  . Pneumococcal Polysaccharide-23 06/18/2004, 09/09/2012  . Tdap 11/03/2018    Past Medical History:  Diagnosis Date  . Adrenal gland anomaly    enlargement  . Anxiety   . CAD (coronary artery disease)   . Carpal tunnel syndrome   . Chronic hyponatremia   . Colitis   . Colonic polyp   . COPD (chronic obstructive pulmonary disease) ([place])   . Degenerative disc disease, lumbar   . Depression   . Diverticulosis   . Dyspnea   . GERD (gastroesophageal reflux disease)   . H/O degenerative disc disease   . Hypercholesterolemia   . Hyperkalemia   . Hyperlipidemia   . Hypertension   . Irritable  bowel syndrome   . Monoclonal gammopathy   . Monoclonal gammopathy   . Neuropathy   . Personal history of tobacco use, presenting hazards to health 08/17/2015  . Sleep apnea   . Vertebral compression fracture (HCC)     Tobacco History: Social History   Tobacco Use  Smoking Status Former Smoker  . Packs/day: 2.00  . Years: 58.00  . Pack years: 116.00  . Types: Cigarettes  . Start date: 08/19/1957  . Quit date: 11/01/2015  . Years since quitting: 4.1  Smokeless Tobacco Never Used   Counseling given: Yes   Continue to not smoke  Outpatient Encounter Medications as of 01/06/2020  Medication Sig  . acetaminophen (TYLENOL) 500 MG tablet Take 500-1,000 mg by mouth 3 (three) times daily as needed for moderate pain or headache.  . albuterol (VENTOLIN HFA) 108 (90 Base) MCG/ACT inhaler Inhale 2 puffs into the lungs every 6 (six) hours as needed for wheezing or shortness of breath.  Marland Kitchen amLODipine (NORVASC) 10 MG tablet Take 1 tablet (10 mg total) by mouth daily. (Patient taking differently: Take 5 mg by mouth daily. )  . azelastine (ASTELIN) 0.1 % nasal spray Place 1 spray into both nostrils 2 (two) times daily. Use in each nostril as directed  . budesonide (ENTOCORT EC) 3 MG 24 hr capsule Take 9 mg by mouth daily as  needed (colitis flare).   . calcium-vitamin D (OSCAL WITH D) 500-200 MG-UNIT tablet Take 2 tablets by mouth daily.  Marland Kitchen ELIQUIS 5 MG TABS tablet Take 5 mg by mouth 2 (two) times daily.   Marland Kitchen FIBER PO Take 1 capsule by mouth daily.   . fluticasone (FLONASE) 50 MCG/ACT nasal spray Place 1 spray into both nostrils daily.  . Fluticasone-Umeclidin-Vilant (TRELEGY ELLIPTA) 100-62.5-25 MCG/INH AEPB Inhale 1 puff into the lungs daily.  . Fluticasone-Umeclidin-Vilant (TRELEGY ELLIPTA) 100-62.5-25 MCG/INH AEPB Inhale 1 puff into the lungs daily.  . furosemide (LASIX) 20 MG tablet Take 1 tablet (20 mg total) by mouth daily as needed. (Patient taking differently: 40 mg. )  . gabapentin (NEURONTIN) 100 MG capsule Take 4 capsules (400 mg) daily in the morning and midday. Take 8 capsules (800 mg) daily in the evening.  . isosorbide mononitrate (IMDUR) 30 MG 24 hr tablet Take 1 tablet (30 mg total) by mouth once daily.  . metoprolol tartrate (LOPRESSOR) 100 MG tablet Take 1 tablet (100 mg total) by mouth 2 (two) times daily. (Patient taking differently: 50 mg. Patient is taking 50mg  twice a day)  . pantoprazole (PROTONIX) 40 MG tablet Take 1 tablet (40 mg total) by mouth 2 (two) times daily.  . predniSONE (DELTASONE) 10 MG tablet Take 10 mg by mouth daily with breakfast.  . sertraline (ZOLOFT) 50 MG tablet Take 1 tablet (50 mg total) by mouth daily.  Marland Kitchen triamcinolone ointment (KENALOG) 0.1 %   . vitamin B-12 (CYANOCOBALAMIN) 1000 MCG tablet Take 1,000 mcg by mouth daily.   . [DISCONTINUED] predniSONE (DELTASONE) 10 MG tablet Take 4 tabs po daily x 2 days; then 3 tabs for 2 days; then 2 tabs for 2 days; then 1 tab for 2 days   No facility-administered encounter medications on file as of 01/06/2020.     Review of Systems  Review of Systems  Constitutional: Positive for fatigue. Negative for activity change, chills, fever and unexpected weight change.  HENT: Positive for congestion. Negative for postnasal drip,  rhinorrhea, sinus pressure, sinus pain and sore throat.   Eyes: Negative.   Respiratory:  Positive for cough and shortness of breath. Negative for wheezing.   Cardiovascular: Negative for chest pain and palpitations.  Gastrointestinal: Negative for constipation, diarrhea, nausea and vomiting.  Endocrine: Negative.   Genitourinary: Negative.   Musculoskeletal: Negative.   Skin: Negative.   Neurological: Negative for dizziness and headaches.  Psychiatric/Behavioral: Negative.  Negative for dysphoric mood. The patient is not nervous/anxious.   All other systems reviewed and are negative.    Physical Exam  BP 126/76 (BP Location: Left Arm, Cuff Size: Normal)   Pulse 78   Temp (!) 97.5 F (36.4 C) (Temporal)   Ht 5\' 10"  (1.778 m)   Wt 174 lb 9.6 oz (79.2 kg)   SpO2 91%   BMI 25.05 kg/m   Wt Readings from Last 5 Encounters:  01/06/20 174 lb 9.6 oz (79.2 kg)  01/04/20 180 lb (81.6 kg)  12/21/19 184 lb 6.4 oz (83.6 kg)  11/24/19 183 lb 12.8 oz (83.4 kg)  10/19/19 182 lb (82.6 kg)    BMI Readings from Last 5 Encounters:  01/06/20 25.05 kg/m  01/04/20 25.83 kg/m  12/21/19 26.46 kg/m  11/24/19 26.37 kg/m  10/19/19 26.11 kg/m     Physical Exam Vitals and nursing note reviewed.  Constitutional:      General: He is not in acute distress.    Appearance: Normal appearance. He is normal weight.  HENT:     Head: Normocephalic and atraumatic.     Right Ear: Hearing, tympanic membrane, ear canal and external ear normal. There is no impacted cerumen.     Left Ear: Hearing, tympanic membrane, ear canal and external ear normal. There is no impacted cerumen.     Nose: Nose normal. No mucosal edema or rhinorrhea.     Right Turbinates: Not enlarged.     Left Turbinates: Not enlarged.     Mouth/Throat:     Mouth: Mucous membranes are dry.     Pharynx: Oropharynx is clear. No oropharyngeal exudate.  Eyes:     Pupils: Pupils are equal, round, and reactive to light.  Cardiovascular:      Rate and Rhythm: Normal rate and regular rhythm.     Pulses: Normal pulses.     Heart sounds: Normal heart sounds. No murmur.  Pulmonary:     Effort: Pulmonary effort is normal.     Breath sounds: Examination of the left-upper field reveals decreased breath sounds. Decreased breath sounds present. No wheezing or rales.  Musculoskeletal:     Cervical back: Normal range of motion.     Right lower leg: No edema.     Left lower leg: No edema.  Lymphadenopathy:     Cervical: No cervical adenopathy.  Skin:    General: Skin is warm and dry.     Capillary Refill: Capillary refill takes less than 2 seconds.     Findings: No erythema or rash.  Neurological:     General: No focal deficit present.     Mental Status: He is alert and oriented to person, place, and time.     Motor: No weakness.     Coordination: Coordination normal.     Gait: Gait is intact. Gait (Tolerated walk in office) normal.  Psychiatric:        Mood and Affect: Mood normal.        Behavior: Behavior normal. Behavior is cooperative.        Thought Content: Thought content normal.        Judgment: Judgment normal.  Assessment & Plan:   Pulmonary hypertension ([place]) Appears fairly euvolemic today Worsened dyspnea on exertion Most recent BNP 2 weeks ago was elevated  Plan: Lab work today Continue Lasix   ILD (interstitial lung disease) ([place]) Patient has had ongoing increased dyspnea on exertion He had a virtual visit in April/2021 with a EW NP where PFTs and CT imaging was suggested, patient declined and requested prednisone taper as well as follow-up in 6 months He had a virtual visit with TP NP in April/2021 where PFTs were ordered Unfortunately PFTs were not scheduled with the patient Patient having increased dyspnea on exertion Continuing to have cough and congestion but he feels that this is at his baseline  Plan: Schedule pulmonary function testing Chest x-ray today Lab work today We will  coordinate establishing with Dr. Chase Caller in [place] clinic  Moderate COPD (chronic obstructive pulmonary disease) ([place]) Increased dyspnea on exertion Suspect this is multifactorial Maintained on Trelegy Ellipta Feels cough and congestion is baseline with clear sputum Last pulmonary function testing in 2019 showed moderate COPD with a diffusion defect  Plan: Continue Trelegy Ellipta Okay to remain on 10 mg daily prednisone 10 daily for right now Order pulmonary function testing Chest x-ray today Lab work today Established with Dr. Chase Caller with close follow-up in the [facility] clinic  Sleep apnea Plan: Continue CPAP therapy  Shortness of breath Shortness of breath is likely multifactorial given pulmonary hypertension, COPD, interstitial lung disease, known anemia  Plan: Chest x-ray today Lab work today Walk today in office with no oxygen desaturations Complete pulmonary function testing as outlined Established with Dr. Chase Caller May need to consider repeat CT imaging based off of pulmonary function testing, would favor high-resolution CT chest    Return in about 4 weeks (around 02/03/2020), or if symptoms worsen or fail to improve, for Follow up with Dr. Purnell Shoemaker, ILD clinic - 57min slot.   Lauraine Rinne, NP 01/06/2020   This appointment required 45 minutes of patient care (this includes precharting, chart review, review of results, face-to-face care, etc.).

## 2020-01-06 NOTE — Patient Instructions (Addendum)
You were seen today by Lauraine Rinne, NP  for:   1. Shortness of breath  Labs today: BNP, CBC with differential, c-Met Chest x-ray today  Believe your shortness of breath is multifactorial given the fact that we know you have COPD, interstitial lung disease, you have drops in your hemoglobin.  Follow-up with primary care regarding your low hemoglobin  Continue your Lasix your fluid lab marker 2 weeks ago was elevated  2. ILD (interstitial lung disease) ([place])  Complete pulmonary function testing  Based off of pulmonary function test may need to consider high-resolution CT chest  3. Moderate COPD (chronic obstructive pulmonary disease) (HCC)  Trelegy Ellipta  >>> 1 puff daily in the morning >>>rinse mouth out after use  >>> This inhaler contains 3 medications that help manage her respiratory status, contact our office if you cannot afford this medication or unable to remain on this medication  Need to complete pulmonary function testing  4. Sleep apnea, unspecified type  We recommend that you continue using your CPAP daily >>>Keep up the hard work using your device >>> Goal should be wearing this for the entire night that you are sleeping, at least 4 to 6 hours  Remember:  . Do not drive or operate heavy machinery if tired or drowsy.  . Please notify the supply company and office if you are unable to use your device regularly due to missing supplies or machine being broken.  . Work on maintaining a healthy weight and following your recommended nutrition plan  . Maintain proper daily exercise and movement  . Maintaining proper use of your device can also help improve management of other chronic illnesses such as: Blood pressure, blood sugars, and weight management.   BiPAP/ CPAP Cleaning:  >>>Clean weekly, with Dawn soap, and bottle brush.  Set up to air dry. >>> Wipe mask out daily with wet wipe or towelette   5. Pulmonary hypertension (HCC)  Continue Lasix  Follow Up:     Return in about 4 weeks (around 02/03/2020), or if symptoms worsen or fail to improve, for Follow up with Dr. Purnell Shoemaker, ILD clinic - 62mn slot.  [place]  Please do your part to reduce the spread of COVID-19:      Reduce your risk of any infection  and COVID19 by using the similar precautions used for avoiding the common cold or flu:  .Marland KitchenWash your hands often with soap and warm water for at least 20 seconds.  If soap and water are not readily available, use an alcohol-based hand sanitizer with at least 60% alcohol.  . If coughing or sneezing, cover your mouth and nose by coughing or sneezing into the elbow areas of your shirt or coat, into a tissue or into your sleeve (not your hands). .Langley GaussA MASK when in public  . Avoid shaking hands with others and consider head nods or verbal greetings only. . Avoid touching your eyes, nose, or mouth with unwashed hands.  . Avoid close contact with people who are sick. . Avoid places or events with large numbers of people in one location, like concerts or sporting events. . If you have some symptoms but not all symptoms, continue to monitor at home and seek medical attention if your symptoms worsen. . If you are having a medical emergency, call 911.   ADDITIONAL HEALTHCARE OPTIONS FOR PATIENTS  [place] / e-Visit: [email or web address]        [facility]: [phone] [facility]  [facility]: [phone]                   [facility]: [phone]     It is flu season:   >>> Best ways to protect herself from the flu: Receive the yearly flu vaccine, practice good hand hygiene washing with soap and also using hand sanitizer when available, eat a nutritious meals, get adequate rest, hydrate appropriately   Please contact the office if your symptoms worsen or you have concerns that you are not improving.   Thank you for choosing [facility]  for your healthcare, and for allowing [place] to partner with you on your healthcare journey. I am thankful to be able to provide care to you today.   Wyn Quaker FNP-C

## 2020-01-06 NOTE — Assessment & Plan Note (Signed)
Patient has had ongoing increased dyspnea on exertion He had a virtual visit in April/2021 with a [facility] NP where PFTs and CT imaging was suggested, patient declined and requested prednisone taper as well as follow-up in 6 months He had a virtual visit with TP NP in April/2021 where PFTs were ordered Unfortunately PFTs were not scheduled with the patient Patient having increased dyspnea on exertion Continuing to have cough and congestion but he feels that this is at his baseline  Plan: Schedule pulmonary function testing Chest x-ray today Lab work today We will coordinate establishing with Dr. Chase Caller in [place] clinic

## 2020-01-06 NOTE — Assessment & Plan Note (Signed)
Shortness of breath is likely multifactorial given pulmonary hypertension, COPD, interstitial lung disease, known anemia  Plan: Chest x-ray today Lab work today [place] today in office with no oxygen desaturations Complete pulmonary function testing as outlined Established with Dr. Chase Caller May need to consider repeat CT imaging based off of pulmonary function testing, would favor high-resolution CT chest

## 2020-01-06 NOTE — Assessment & Plan Note (Signed)
Increased dyspnea on exertion Suspect this is multifactorial Maintained on Trelegy Ellipta Feels cough and congestion is baseline with clear sputum Last pulmonary function testing in 2019 showed moderate COPD with a diffusion defect  Plan: Continue Trelegy Ellipta Okay to remain on 10 mg daily prednisone 10 daily for right now Order pulmonary function testing Chest x-ray today Lab work today Established with Dr. Chase Caller with close follow-up in the [facility] clinic

## 2020-01-07 ENCOUNTER — Other Ambulatory Visit: Payer: Self-pay | Admitting: Internal Medicine

## 2020-01-07 ENCOUNTER — Telehealth: Payer: Self-pay | Admitting: Internal Medicine

## 2020-01-07 DIAGNOSIS — I10 Essential (primary) hypertension: Secondary | ICD-10-CM

## 2020-01-07 DIAGNOSIS — R739 Hyperglycemia, unspecified: Secondary | ICD-10-CM

## 2020-01-07 DIAGNOSIS — E785 Hyperlipidemia, unspecified: Secondary | ICD-10-CM

## 2020-01-07 DIAGNOSIS — Z125 Encounter for screening for malignant neoplasm of prostate: Secondary | ICD-10-CM

## 2020-01-07 DIAGNOSIS — E871 Hypo-osmolality and hyponatremia: Secondary | ICD-10-CM

## 2020-01-07 NOTE — Progress Notes (Signed)
Known chronic interstitial lung disease.  No acute findings on chest x-ray.  Proceed forward with pulmonary function testing as scheduled.  Keep follow-up with Dr. Chase Caller in June/2021.Wyn Quaker, FNP

## 2020-01-07 NOTE — Telephone Encounter (Signed)
Please schedule fasting lab appt in 2 weeks (cardiology sent me a note - needed to schedule through pcp office).  Thanks.

## 2020-01-07 NOTE — Progress Notes (Signed)
Orders placed for f/u labs.  

## 2020-01-07 NOTE — Telephone Encounter (Signed)
Patient called and scheduled.

## 2020-01-10 ENCOUNTER — Other Ambulatory Visit: Payer: Self-pay | Admitting: Internal Medicine

## 2020-01-10 NOTE — Progress Notes (Signed)
See previous note as well as x-ray results.Wyn Quaker, FNP

## 2020-01-11 NOTE — Progress Notes (Signed)
Tried calling pt and no answer- LMTCB

## 2020-01-12 ENCOUNTER — Telehealth: Payer: Self-pay | Admitting: Pulmonary Disease

## 2020-01-12 NOTE — Progress Notes (Signed)
lmtcb

## 2020-01-12 NOTE — Telephone Encounter (Signed)
Lauraine Rinne, NP sent to [facility] Triage  Known chronic interstitial lung disease. No acute findings on chest x-ray. Proceed forward with pulmonary function testing as scheduled. Keep follow-up with Dr. Chase Caller in June/2021.  Wyn Quaker, FNP  BNP which is a fluid marker still elevated.   Ensure patient is taking Lasix 20 mg. If he has not been taking it please emphasized that he needs to start as it looks like he may potentially be retaining fluid. If patient needs a prescription for Lasix okay to send in:   Lasix 20 mg tablet Take 1 tablet by mouth daily for next 7 days  If patient has been taking his Lasix and he still retaining fluid please have patient double Lasix dose to 2 20 mg tablets daily (a total of 40 mg) for the next 4 days. He will also need follow-up with primary care   Patient will need repeat lab work with primary care to monitor kidney functioning in about 2 weeks.  will route message to Dr. Nicki Reaper patient's primary care provider.   Wyn Quaker FNP   ------------------------------------------------------------------- I tried calling the pt again and had to [facility] 01/12/20

## 2020-01-12 NOTE — Progress Notes (Signed)
I would send letter and close result.Wyn Quaker, FNP

## 2020-01-13 ENCOUNTER — Encounter: Payer: Self-pay | Admitting: *Deleted

## 2020-01-13 NOTE — Progress Notes (Signed)
Letter mailed to the pt to call for these results after several unsuccessful attempts to reach the pt.

## 2020-01-13 NOTE — Telephone Encounter (Signed)
ATC pt, call went straight to VM. LMTCB x2 for pt.  

## 2020-01-13 NOTE — Telephone Encounter (Signed)
Letter mailed to pt. He has been reached out to multiple times regarding his results.

## 2020-01-17 ENCOUNTER — Encounter: Payer: Self-pay | Admitting: Internal Medicine

## 2020-01-17 NOTE — Assessment & Plan Note (Signed)
Followed by hematology.  Receiving iron infusions.   

## 2020-01-17 NOTE — Assessment & Plan Note (Signed)
On zoloft.  Stable.  

## 2020-01-17 NOTE — Assessment & Plan Note (Signed)
Per pulmonary - breathing stable.

## 2020-01-17 NOTE — Assessment & Plan Note (Signed)
Sleep an issue.  Discussed concern regarding sleep apnea.  Discussed concern regarding sleeping medication with untreated sleep apnea.  Melatonin.  Needs evaluation for sleep apnea.  Also discussed concern regarding mixing sleeping medication and increased alcohol.  Needs to decrease alcohol intake.  Follow.

## 2020-01-17 NOTE — Assessment & Plan Note (Signed)
Followed by hematology 

## 2020-01-17 NOTE — Assessment & Plan Note (Signed)
Continue trelegy.  Use albuterol as needed.  Has mucinex to help with congestion and cough.  Prednisone 10mg  q day - per pulmonary.  Keep f/u with pulmonary.

## 2020-01-17 NOTE — Assessment & Plan Note (Signed)
S/p cardioversion.  On eliquis.  Stable.  Continue f/u with cardiology.  

## 2020-01-17 NOTE — Assessment & Plan Note (Signed)
Saline nasal spray and astelin nasal spray as directed.  Can continue mucinex.  Follow.

## 2020-01-17 NOTE — Assessment & Plan Note (Signed)
Has 4cm ascending thoracic aneurysm.  Recommended f/u chest CT in one year.  Last documented 04/2018.  Need to schedule f/u chest CT

## 2020-01-17 NOTE — Assessment & Plan Note (Signed)
Due to f/u with pulmonary.  Planning for PFTs.

## 2020-01-17 NOTE — Assessment & Plan Note (Signed)
Blood pressure as outlined on 1/2 amlodipine.  Follow pressures.  Follow metabolic panel.

## 2020-01-18 DIAGNOSIS — M810 Age-related osteoporosis without current pathological fracture: Secondary | ICD-10-CM | POA: Diagnosis not present

## 2020-01-18 DIAGNOSIS — R0602 Shortness of breath: Secondary | ICD-10-CM | POA: Diagnosis not present

## 2020-01-18 DIAGNOSIS — I2510 Atherosclerotic heart disease of native coronary artery without angina pectoris: Secondary | ICD-10-CM | POA: Diagnosis not present

## 2020-01-24 ENCOUNTER — Other Ambulatory Visit: Payer: Self-pay | Admitting: Internal Medicine

## 2020-01-24 ENCOUNTER — Other Ambulatory Visit: Payer: Self-pay

## 2020-01-24 ENCOUNTER — Other Ambulatory Visit (INDEPENDENT_AMBULATORY_CARE_PROVIDER_SITE_OTHER): Payer: PPO

## 2020-01-24 ENCOUNTER — Telehealth: Payer: Self-pay | Admitting: *Deleted

## 2020-01-24 DIAGNOSIS — R739 Hyperglycemia, unspecified: Secondary | ICD-10-CM | POA: Diagnosis not present

## 2020-01-24 DIAGNOSIS — E785 Hyperlipidemia, unspecified: Secondary | ICD-10-CM | POA: Diagnosis not present

## 2020-01-24 DIAGNOSIS — Z125 Encounter for screening for malignant neoplasm of prostate: Secondary | ICD-10-CM

## 2020-01-24 DIAGNOSIS — Z8781 Personal history of (healed) traumatic fracture: Secondary | ICD-10-CM | POA: Diagnosis not present

## 2020-01-24 DIAGNOSIS — I10 Essential (primary) hypertension: Secondary | ICD-10-CM | POA: Diagnosis not present

## 2020-01-24 DIAGNOSIS — E871 Hypo-osmolality and hyponatremia: Secondary | ICD-10-CM

## 2020-01-24 DIAGNOSIS — I2510 Atherosclerotic heart disease of native coronary artery without angina pectoris: Secondary | ICD-10-CM | POA: Diagnosis not present

## 2020-01-24 DIAGNOSIS — R609 Edema, unspecified: Secondary | ICD-10-CM | POA: Diagnosis not present

## 2020-01-24 DIAGNOSIS — M810 Age-related osteoporosis without current pathological fracture: Secondary | ICD-10-CM | POA: Diagnosis not present

## 2020-01-24 DIAGNOSIS — R0602 Shortness of breath: Secondary | ICD-10-CM | POA: Diagnosis not present

## 2020-01-24 LAB — BASIC METABOLIC PANEL
BUN: 17 mg/dL (ref 6–23)
CO2: 24 mEq/L (ref 19–32)
Calcium: 9.7 mg/dL (ref 8.4–10.5)
Chloride: 92 mEq/L — ABNORMAL LOW (ref 96–112)
Creatinine, Ser: 1.01 mg/dL (ref 0.40–1.50)
GFR: 71.47 mL/min (ref >60.00)
Glucose, Bld: 72 mg/dL (ref 70–99)
Potassium: 4.8 mEq/L (ref 3.5–5.1)
Sodium: 124 mEq/L — ABNORMAL LOW (ref 135–145)

## 2020-01-24 LAB — PSA, MEDICARE: PSA: 1.48 ng/ml (ref 0.10–4.00)

## 2020-01-24 LAB — HEPATIC FUNCTION PANEL
ALT: 8 U/L (ref 0–53)
AST: 12 U/L (ref 0–37)
Albumin: 3.7 g/dL (ref 3.5–5.2)
Alkaline Phosphatase: 56 U/L (ref 39–117)
Bilirubin, Direct: 0 mg/dL (ref 0.0–0.3)
Total Bilirubin: 0.3 mg/dL (ref 0.2–1.2)
Total Protein: 7.8 g/dL (ref 6.0–8.3)

## 2020-01-24 LAB — LIPID PANEL
Cholesterol: 145 mg/dL (ref 0–200)
HDL: 68.3 mg/dL (ref >39.00)
LDL Cholesterol: 65 mg/dL (ref 0–99)
NonHDL: 76.46
Total CHOL/HDL Ratio: 2
Triglycerides: 56 mg/dL (ref 0.0–149.0)
VLDL: 11.2 mg/dL (ref 0.0–40.0)

## 2020-01-24 LAB — HEMOGLOBIN A1C: Hgb A1c MFr Bld: 4.6 % (ref 4.6–6.5)

## 2020-01-24 LAB — TSH: TSH: 1.98 u[IU]/mL (ref 0.35–4.50)

## 2020-01-24 NOTE — Progress Notes (Signed)
Order placed for f/u stat sodium.

## 2020-01-24 NOTE — Telephone Encounter (Signed)
Please place future orders for lab appt.  

## 2020-01-25 ENCOUNTER — Other Ambulatory Visit (INDEPENDENT_AMBULATORY_CARE_PROVIDER_SITE_OTHER): Payer: PPO

## 2020-01-25 DIAGNOSIS — E871 Hypo-osmolality and hyponatremia: Secondary | ICD-10-CM

## 2020-01-25 LAB — SODIUM: Sodium: 126 mEq/L — ABNORMAL LOW (ref 135–145)

## 2020-01-26 ENCOUNTER — Other Ambulatory Visit: Payer: Self-pay | Admitting: Internal Medicine

## 2020-01-26 DIAGNOSIS — E871 Hypo-osmolality and hyponatremia: Secondary | ICD-10-CM

## 2020-01-26 NOTE — Progress Notes (Signed)
Order placed for f/u sodium.  ?

## 2020-01-27 ENCOUNTER — Telehealth: Payer: Self-pay

## 2020-01-27 NOTE — Telephone Encounter (Signed)
Lm to relay date/time of covid test prior to PFT.  01/31/2020 [facility]

## 2020-01-28 ENCOUNTER — Encounter: Payer: Self-pay | Admitting: Internal Medicine

## 2020-01-28 ENCOUNTER — Telehealth: Payer: Self-pay | Admitting: Internal Medicine

## 2020-01-28 ENCOUNTER — Other Ambulatory Visit: Payer: Self-pay

## 2020-01-28 ENCOUNTER — Ambulatory Visit: Payer: PPO | Admitting: Internal Medicine

## 2020-01-28 ENCOUNTER — Other Ambulatory Visit
Admission: RE | Admit: 2020-01-28 | Discharge: 2020-01-28 | Disposition: A | Discharge status: Home / Self Care | Payer: PPO | Attending: Internal Medicine | Admitting: Internal Medicine

## 2020-01-28 VITALS — BP 136/74 | HR 69 | Temp 97.7°F | Ht 70.0 in | Wt 181.8 lb

## 2020-01-28 DIAGNOSIS — J441 Chronic obstructive pulmonary disease with (acute) exacerbation: Secondary | ICD-10-CM | POA: Diagnosis not present

## 2020-01-28 DIAGNOSIS — J849 Interstitial pulmonary disease, unspecified: Secondary | ICD-10-CM | POA: Diagnosis not present

## 2020-01-28 DIAGNOSIS — R0609 Other forms of dyspnea: Secondary | ICD-10-CM

## 2020-01-28 DIAGNOSIS — Z87891 Personal history of nicotine dependence: Secondary | ICD-10-CM | POA: Diagnosis not present

## 2020-01-28 DIAGNOSIS — J449 Chronic obstructive pulmonary disease, unspecified: Secondary | ICD-10-CM

## 2020-01-28 DIAGNOSIS — R0600 Dyspnea, unspecified: Secondary | ICD-10-CM | POA: Diagnosis not present

## 2020-01-28 LAB — SEDIMENTATION RATE: Sed Rate: 128 mm/hr — ABNORMAL HIGH (ref 0–20)

## 2020-01-28 MED ORDER — PREDNISONE 10 MG PO TABS: ORAL_TABLET | ORAL | 0 refills | Status: DC

## 2020-01-28 MED ORDER — TRELEGY ELLIPTA 100-62.5-25 MCG/INH IN AEPB: 1.0000 | INHALATION_SPRAY | Freq: Every day | RESPIRATORY_TRACT | 0 refills | Status: AC

## 2020-01-28 MED ORDER — DOXYCYCLINE HYCLATE 100 MG PO TABS
100.0000 mg | ORAL_TABLET | Freq: Two times a day (BID) | ORAL | 0 refills | Status: DC
Start: 2020-01-28 — End: 2020-03-21

## 2020-01-28 NOTE — Progress Notes (Signed)
OV 01/28/2020 -establishing pulmonary care in [place] with Dr. Chase Caller.  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  Prior to this visit he is not subjectively aware of the fact that he might have ILD although it is documented in his chart.  Subjective:  Patient ID: Troy Lopez, male , DOB: [birth year] , age 79 y.o. , MRN: [id number] , ADDRESS: [place] [address]   01/28/2020 -   Chief Complaint  Patient presents with  . Follow-up    pt reports of sob with exertion, occ cough at times prod with clear mucus and occ wheezing.      HPI Troy Lopez 79 y.o. -he is here with his wife.  He believes he has COPD.  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  No subsequent PFTs.  Most recently he was asked to take Trelegy but because of expense he switch to [facility].  He tells me that for this visit that starting April he has had worsening shortness of breath compared to baseline.  He has been on prednisone taper x2.  He wanted low-dose prednisone on a daily basis for a month.  These seem to have helped but when he comes off the prednisone it is worse.  For the last 3 or 4 days has had worsening shortness of breath.  In fact when he walked only 1 out of her 3 laps he had to stop because of shortness of breath although he did not desaturate.  His resting pulse ox was 99% with a heart rate of 73/min.  His final pulse ox at 1 lap was 97/min which is only a drop of 2 points but he got tachycardic at 96/min and he stopped.  However he denies any change in his cough or sputum production or wheezing.  He does have some mild cough and white sputum production at baseline but there is no change.  Intermittent wheezing.  No orthopnea proximal nocturnal dyspnea edema.  He is on Eliquis.  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  Recently was given Lasix for elevated BNP  but this has not helped.  He had an echo with [facility] and it is normal.  Most recent creatinine January 24, 2020 is 1 mg percent.  Sodium is 124 and low and this is chronically low in December it was 131.  He also appears chronically anemic with hemoglobin 8-9 g% which is lower than the year 2020 when he was between 10 and 11 g%.  Most recen recent test was in May 2021  [place] ILD Questionnaire  Symptoms: Gradual onset of shortness of breath getting worse in the last 3 years.  Episodic shortness of breath as well.  Has difficulty keeping up with others of his age.  Cough started 2 years ago.  It is the same since it started mostly mild but occasionally can be severe.  Has clear sputum he does clear his throat is a tickle in the back of the throat.   SYMPTOM SCALE - ILD 01/28/2020   O2 use ra  Shortness of Breath 0 -> 5 scale with 5 being worst (score 6 If unable to do)  At rest 5  Simple tasks - showers, clothes change, eating, shaving 5  Household (dishes, doing bed, laundry) 5  Shopping x  Walking level at own pace 5  Walking up Stairs 5  Total (30-36) Dyspnea Score 25  How bad is your cough? 2  How bad is your fatigue yes  How bad is nausea 0  How bad is vomiting?  0  How bad is diarrhea? 0  How bad is anxiety? x  How bad is depression x        Past Medical History : *-As below   ROS:  -Several years of snoring morning headaches except daytime somnolence.  Some nausea.  Positive for acid reflux.  Positive for fatigue possible Raynaud's   FAMILY HISTORY of LUNG DISEASE:  -He did not fill the sheet   EXPOSURE HISTORY:  -No intravenous drug use no cocaine use.  Used marijuana only a couple of times.  No vaping   HOME and HOBBY DETAILS :  -Denies any kind of organic antigen exposure in his home.  [place] age of the home is 31 years and is lived there for 61 years but denies any mold or mildew or pet  hamsters of birds or [place] use of misting [place] use.  No feather pillows no mold in the [facility] duct no music habits no guarding habits no feather blankets or duvet.   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  Positive for working [employer] as a hobby positive for working in [employer] but otherwise negative.  PULMONARY TOXICITY HISTORY (27 items): Other than prednisone no other medicines for pulmonary or causing pulmonary toxicity    Testing     ROS - per HPI     has a past medical history of Adrenal gland anomaly, Anxiety, CAD (coronary artery disease), Carpal tunnel syndrome, Chronic hyponatremia, Colitis, Colonic polyp, COPD (chronic obstructive pulmonary disease) ([place]), Degenerative disc disease, lumbar, Depression, Diverticulosis, Dyspnea, GERD (gastroesophageal reflux disease), H/O degenerative disc disease, Hypercholesterolemia, Hyperkalemia, Hyperlipidemia, Hypertension, Irritable bowel syndrome, Monoclonal gammopathy, Monoclonal gammopathy, Neuropathy, Personal history of tobacco use, presenting hazards to health (08/17/2015), Sleep apnea, and Vertebral compression fracture ([place]).   reports that he quit smoking about 4 years ago. His smoking use included cigarettes. He started smoking about 62 years ago. He has a 116.00 pack-year smoking history. He has never used smokeless tobacco.  Past Surgical History:  Procedure Laterality Date  . BUNIONECTOMY  1989  . CARPAL TUNNEL RELEASE Left 2011   ulnar nerve sub muscular at elbow  . CHOLECYSTECTOMY  09/07  . COLONOSCOPY WITH PROPOFOL N/A 03/05/2017   Procedure: COLONOSCOPY WITH PROPOFOL;  Surgeon: Lucilla Lame, MD;  Location: [facility] ENDOSCOPY;  Service: Endoscopy;  Laterality: N/A;  . ELECTROPHYSIOLOGIC STUDY N/A 11/15/2015   Procedure: CARDIOVERSION;  Surgeon: Isaias Cowman, MD;  Location: [facility] ORS;  Service: Cardiovascular;  Laterality: N/A;  . ESOPHAGOGASTRODUODENOSCOPY (EGD) WITH  PROPOFOL N/A 03/05/2017   Procedure: ESOPHAGOGASTRODUODENOSCOPY (EGD) WITH PROPOFOL;  Surgeon: Lucilla Lame, MD;  Location: [facility] ENDOSCOPY;  Service: Endoscopy;  Laterality: N/A;  . ESOPHAGOGASTRODUODENOSCOPY (EGD) WITH PROPOFOL N/A 10/17/2017   Procedure: ESOPHAGOGASTRODUODENOSCOPY (EGD) WITH PROPOFOL;  Surgeon: Lollie Sails, MD;  Location: [facility] ENDOSCOPY;  Service: Endoscopy;  Laterality: N/A;  . EYE SURGERY Bilateral 2010   cataract  . HEMORRHOID SURGERY    . KYPHOPLASTY N/A 11/04/2016   Procedure: KYPHOPLASTY T 12;  Surgeon: Hessie Knows, MD;  Location: [facility] ORS;  Service: Orthopedics;  Laterality: N/A;    Allergies  Allergen Reactions  . 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 Influenza Split 05/22/2009, 07/17/2011, 06/02/2014  . Influenza, High Dose Seasonal PF 04/19/2016, 04/30/2017, 07/07/2018, 07/14/2019  . Influenza,inj,Quad PF,6+ Mos 05/26/2015  . PFIZER SARS-COV-2 Vaccination 08/24/2019, 09/14/2019  . Pneumococcal Conjugate-13 08/22/2017  . Pneumococcal Polysaccharide-23 06/18/2004, 09/09/2012  . Tdap 11/03/2018    Family History  Problem Relation Age of Onset  . Stroke Mother   . Heart disease Father        MI - 42   . Colon cancer Neg Hx   . Prostate cancer Neg Hx      Current Outpatient Medications:  .  acetaminophen (TYLENOL) 500 MG tablet, Take 500-1,000 mg by mouth 3 (three) times daily as needed for moderate pain or headache., Disp: , Rfl:  .  albuterol (VENTOLIN HFA) 108 (90 Base) MCG/ACT inhaler, Inhale 2 puffs into the lungs every 6 (six) hours as needed for wheezing or shortness of breath., Disp: 54 g, Rfl: 3 .  amLODipine (NORVASC) 10 MG tablet, Take 1 tablet (10 mg total) by mouth daily. (Patient taking differently: Take 5 mg  by mouth daily. ), Disp: 90 tablet, Rfl: 1 .  azelastine (ASTELIN) 0.1 % nasal spray, Place 1 spray into both nostrils 2 (two) times daily. Use in each nostril as directed, Disp: 30 mL, Rfl: 1 .  budesonide (ENTOCORT EC) 3 MG 24 hr capsule, Take 9 mg by mouth daily as needed (colitis flare). , Disp: , Rfl:  .  calcium-vitamin D (OSCAL WITH D) 500-200 MG-UNIT tablet, Take 2 tablets by mouth daily., Disp: , Rfl:  .  ELIQUIS 5 MG TABS tablet, Take 5 mg by mouth 2 (two) times daily. , Disp: , Rfl:  .  FIBER PO, Take 1 capsule by mouth daily. , Disp: , Rfl:  .  fluticasone (FLONASE) 50 MCG/ACT nasal spray, Place 1 spray into both nostrils daily., Disp: 16 g, Rfl: 2 .  furosemide (LASIX) 20 MG tablet, Take 1 tablet (20 mg total) by mouth daily as needed. (Patient taking differently: 40 mg. ), Disp: 30 tablet, Rfl: 0 .  gabapentin (NEURONTIN) 100 MG capsule, Take 4 capsules (400 mg) daily in the morning and midday. Take 8 capsules (800 mg) daily in the evening., Disp: 480 capsule, Rfl: 0 .  isosorbide mononitrate (IMDUR) 30 MG 24 hr tablet, Take 1 tablet (30 mg total) by mouth once daily., Disp: , Rfl:  .  metoprolol tartrate (LOPRESSOR) 100 MG tablet, Take 1 tablet (100 mg total) by mouth 2 (two) times daily. (Patient taking differently: 50 mg. Patient is taking 53m twice a day), Disp: 60 tablet, Rfl: 0 .  pantoprazole (PROTONIX) 40 MG tablet, Take 1 tablet (40 mg total) by mouth 2 (two) times daily., Disp: 180 tablet, Rfl: 0 .  sertraline (ZOLOFT) 50 MG tablet, Take 1 tablet (50 mg total) by mouth daily., Disp: 30 tablet, Rfl: 0 .  triamcinolone ointment (KENALOG) 0.1 %, , Disp: , Rfl:  .  vitamin B-12 (CYANOCOBALAMIN) 1000 MCG tablet, Take 1,000 mcg by mouth daily. , Disp: , Rfl:  .  doxycycline (VIBRA-TABS) 100 MG tablet, Take 1 tablet (100 mg total) by mouth 2 (two) times daily., Disp: 10 tablet, Rfl: 0 .  Fluticasone-Umeclidin-Vilant (TRELEGY ELLIPTA) 100-62.5-25 MCG/INH AEPB, Inhale 1 puff into the  lungs daily. (Patient not taking: Reported on 01/28/2020), Disp: 1 each, Rfl: 2 .  Fluticasone-Umeclidin-Vilant (TRELEGY ELLIPTA) 100-62.5-25 MCG/INH AEPB, Inhale 1 puff into the lungs daily for 1 day., Disp: 28 each, Rfl: 0 .  predniSONE (DELTASONE) 10 MG tablet, 4 tabs x2 days, 2 tabs x 2 days, 1 tab x 2 days then 0.5 tab x 2 days., Disp: 15 tablet, Rfl: 0      Objective:   Vitals:   01/28/20 1509  BP: 136/74  Pulse: 69  Temp: 97.7 F (36.5 C)  TempSrc: Temporal  SpO2: 100%  Weight: 181 lb 12.8 oz (82.5 kg)  Height: '5\' 10"'  (1.778 m)    Estimated body mass index is 26.09 kg/m as calculated from the following:   Height as of this encounter: '5\' 10"'  (1.778 m).   Weight as of this encounter: 181 lb 12.8 oz (82.5 kg).  '@WEIGHTCHANGE' @  [employer]   01/28/20 1509  Weight: 181 lb 12.8 oz (82.5 kg)     Physical Exam Deconditioned male with a classic COPD smokers kind of cough.  No definite crackles.  Slightly barrel chest.  Oral cavity without any thrush.  No sinus no clubbing no edema abdomen soft normal heart sounds no elevated JVP or neck nodes.         Assessment:       ICD-10-CM   1. DOE (dyspnea on exertion)  R06.00   2. ILD (interstitial lung disease) (HCC)  J84.9 Sedimentation rate    Angiotensin converting enzyme    ANA w/Reflex    Anti-DNA antibody, double-stranded    Rheumatoid factor    Cyclic citrul peptide antibody, IgG    ANCA screen with reflex titer    Antimyeloperoxidase (MPO) Abs    CK Total (and CKMB)    Aldolase    Anti-scleroderma antibody    Sjogren's syndrome antibods(ssa + ssb)    Hypersensitivity Pneumonitis    Antiproteinase 3 (PR-3) Abs    Pulse oximetry, overnight    CANCELED: AMB REFERRAL FOR DME  3. Moderate COPD (chronic obstructive pulmonary disease) (HCC)  J44.9   4. COPD exacerbation ([place])  J44.1   5. Stopped smoking with greater than 40 pack year history  Z87.891   6. Interstitial pulmonary disease (HCC)  J84.9 CT Chest High  Resolution   I am puzzled by his declining dyspnea.  I wonder if it is all fatigue which can be explained by his anemia and also by his low sodium.  This is because PE is unlikely and heart failure has been ruled out to explain class III can of dyspnea.  He did not desaturate significantly when he stopped walking at a very submaximal exertion.  In the short-term we will treat for COPD exacerbation although I told him that he does not have all the components to meet the definition of COPD exacerbation.  We will proceed with investigations to see if his interstitial lung disease has gotten worse -we will do work-up such as high-resolution CT chest overnight oxygen on autoimmune panel  Addendum: After he left we will initiate repeat labs for hyponatremia and anemia    Plan:     Patient Instructions     ICD-10-CM   1. DOE (dyspnea on exertion)  R06.00   2. ILD (interstitial lung disease) ([place])  J84.9   3. Moderate COPD (chronic obstructive pulmonary disease) (HCC)  J44.9   4. COPD exacerbation ([place])  J44.1   5. Stopped smoking with greater than 40 pack year history  Z87.891    Unclear why you have worsening of the shortness of breath  since the last few days I will up to presume this is COPD flareup or your coexistent interstitial lung disease is getting worse We do not know the basis for interstitial lung disease  Plan -Take doxycycline 182m po twice daily x 5 days; take after meals and avoid sunlight -Take prednisone 40 mg daily x 2 days, then 278mdaily x 2 days, then 1033maily x 2 days, then 5mg56mily x 2 days and stop - given fact Ttrelegy was expensive - take 1 month of sample from us -Korease this instead of breo for next 1 month - use albuterol as needed - test for ONO on room air  - Do HRCT supine and prone - Do full PFT  - Do blood work Serum: ESR, ACE, ANA, DS-DNA, RF, anti-CCP,  ANCA screen, MPO, PR-3, Total CK,  Aldolase,  scl-70, ssA, ssB, Hypersensitivity Pneumonitis  Panel   Followup - 4-6 weeks with Dr RamaChase Caller 30 min visit but after completing above tests  Addendum: We will order repeat labs for hyponatremia and anemia   ( Level 05 visit: Estb 40-54 min   in  visit type: on-site physical face to visit  in total care time and counseling or/and coordination of care by this undersigned MD - Dr MuraBrand Malesis includes one or more of the following on this same day 01/28/2020: pre-charting, chart review, note writing, documentation discussion of test results, diagnostic or treatment recommendations, prognosis, risks and benefits of management options, instructions, education, compliance or risk-factor reduction. It excludes time spent by the [facility] [place] staff in the care of the patient. Actual time 65 m16)    SIGNATURE    Dr. MuraBrand MalesD., F.C.C.P,  Pulmonary and Critical Care Medicine Staff Physician, [facility] Interstitial Lung Disease  Program  Pulmonary [place], [id number]: [phone] no answer or between  15:00h - 7:00h: call [phone] Telephone: [phone]  6:12 PM 01/28/2020

## 2020-01-28 NOTE — Telephone Encounter (Signed)
Left message x2 on mobile number on file. ATC EC home contact and received recording that my call could not be completed at this time.

## 2020-01-28 NOTE — Telephone Encounter (Signed)
Troy Lopez  I think some of the reason he feels poorly is because of his anemia and low sodium.  Plan -Please have him repeat CBC, chemistry and liver function test -next week which is week of January 31, 2020\  PS: Tried sending it to [employer] but I think her last name changed.  Therefore sending it to pulmonary triage

## 2020-01-28 NOTE — Patient Instructions (Signed)
ICD-10-CM   1. DOE (dyspnea on exertion)  R06.00   2. ILD (interstitial lung disease) ([place])  J84.9   3. Moderate COPD (chronic obstructive pulmonary disease) (HCC)  J44.9   4. COPD exacerbation ([place])  J44.1   5. Stopped smoking with greater than 40 pack year history  Z87.891    Unclear why you have worsening of the shortness of breath since the last few days I will up to presume this is COPD flareup or your coexistent interstitial lung disease is getting worse We do not know the basis for interstitial lung disease  Plan -Take doxycycline 148m po twice daily x 5 days; take after meals and avoid sunlight -Take prednisone 40 mg daily x 2 days, then 279mdaily x 2 days, then 1037maily x 2 days, then 5mg38mily x 2 days and stop - given fact Ttrelegy was expensive - take 1 month of sample from us -Korease this instead of breo for next 1 month - use albuterol as needed - test for ONO on room air  - Do HRCT supine and prone - Do full PFT  - Do blood work Serum: ESR, ACE, ANA, DS-DNA, RF, anti-CCP,  ANCA screen, MPO, PR-3, Total CK,  Aldolase,  scl-70, ssA, ssB, Hypersensitivity Pneumonitis Panel   Followup - 4-6 weeks with Dr RamaChase Caller 30 min visit but after completing above tests

## 2020-01-28 NOTE — Telephone Encounter (Signed)
Pt's spouse, carolyn (DPR) is aware of date/time of covid test. Nothing further is needed.

## 2020-01-29 LAB — RHEUMATOID FACTOR: Rheumatoid fact SerPl-aCnc: 11 IU/mL (ref 0.0–13.9)

## 2020-01-29 LAB — SJOGREN'S SYNDROME ANTIBODS(SSA + SSB)
SSA (Ro) (ENA) Antibody, IgG: <0.2 AI (ref 0.0–0.9)
SSB (La) (ENA) Antibody, IgG: <0.2 AI (ref 0.0–0.9)

## 2020-01-29 LAB — ANTI-DNA ANTIBODY, DOUBLE-STRANDED: ds DNA Ab: <1 IU/mL (ref 0–9)

## 2020-01-29 LAB — ALDOLASE: Aldolase: 3.5 U/L (ref 3.3–10.3)

## 2020-01-29 LAB — ANGIOTENSIN CONVERTING ENZYME: Angiotensin-Converting Enzyme: 63 U/L (ref 14–82)

## 2020-01-29 LAB — ANTI-SCLERODERMA ANTIBODY: Scleroderma (Scl-70) (ENA) Antibody, IgG: <0.2 AI (ref 0.0–0.9)

## 2020-01-29 LAB — ANA W/REFLEX: Anti Nuclear Antibody (ANA): NEGATIVE

## 2020-01-30 LAB — MISC LABCORP TEST (SEND OUT)
LabCorp test name: 163840
LabCorp test name: 163857
Labcorp test code: 163840
Labcorp test code: 163857

## 2020-01-31 ENCOUNTER — Other Ambulatory Visit
Admission: RE | Admit: 2020-01-31 | Discharge: 2020-01-31 | Disposition: A | Discharge status: Home / Self Care | Payer: PPO | Source: Ambulatory Visit | Attending: Pulmonary Disease | Admitting: Pulmonary Disease

## 2020-01-31 ENCOUNTER — Ambulatory Visit: Payer: PPO

## 2020-01-31 ENCOUNTER — Other Ambulatory Visit: Payer: Self-pay

## 2020-01-31 DIAGNOSIS — Z01812 Encounter for preprocedural laboratory examination: Secondary | ICD-10-CM | POA: Diagnosis not present

## 2020-01-31 DIAGNOSIS — Z20822 Contact with and (suspected) exposure to covid-19: Secondary | ICD-10-CM | POA: Diagnosis not present

## 2020-01-31 LAB — CYCLIC CITRUL PEPTIDE ANTIBODY, IGG/IGA: CCP Antibodies IgG/IgA: 1 units (ref 0–19)

## 2020-01-31 NOTE — Telephone Encounter (Signed)
Called and spoke with pt letting him know the info stated by Dr. Chase Caller. Stated to him that MR wants [place] to repeat labwork this week. Pt verbalized understanding and stated he will be having labs performed at PCP office tomorrow 6/15. Stated to him that he can have these labs done at PCP as well and he verbalized understanding.  Routing this encounter to pt's PCP Dr. Nicki Reaper so that way they have labs listed that Dr. Chase Caller wants to have drawn. I have already placed the orders for these lab tests. Nothing further needed.

## 2020-01-31 NOTE — Telephone Encounter (Signed)
See note.  They have ordered labs that need to be drawn at our office.

## 2020-02-01 ENCOUNTER — Ambulatory Visit: Payer: PPO | Attending: Adult Health

## 2020-02-01 ENCOUNTER — Telehealth: Payer: Self-pay | Admitting: Internal Medicine

## 2020-02-01 ENCOUNTER — Telehealth: Payer: Self-pay | Admitting: *Deleted

## 2020-02-01 ENCOUNTER — Other Ambulatory Visit (INDEPENDENT_AMBULATORY_CARE_PROVIDER_SITE_OTHER): Payer: PPO

## 2020-02-01 ENCOUNTER — Other Ambulatory Visit: Payer: Self-pay

## 2020-02-01 DIAGNOSIS — E871 Hypo-osmolality and hyponatremia: Secondary | ICD-10-CM | POA: Diagnosis not present

## 2020-02-01 DIAGNOSIS — J849 Interstitial pulmonary disease, unspecified: Secondary | ICD-10-CM | POA: Diagnosis not present

## 2020-02-01 DIAGNOSIS — R0600 Dyspnea, unspecified: Secondary | ICD-10-CM | POA: Diagnosis not present

## 2020-02-01 DIAGNOSIS — R0609 Other forms of dyspnea: Secondary | ICD-10-CM

## 2020-02-01 LAB — BASIC METABOLIC PANEL
BUN: 18 mg/dL (ref 6–23)
CO2: 23 mEq/L (ref 19–32)
Calcium: 9.6 mg/dL (ref 8.4–10.5)
Chloride: 93 mEq/L — ABNORMAL LOW (ref 96–112)
Creatinine, Ser: 1.12 mg/dL (ref 0.40–1.50)
GFR: 63.43 mL/min (ref >60.00)
Glucose, Bld: 143 mg/dL — ABNORMAL HIGH (ref 70–99)
Potassium: 4.5 mEq/L (ref 3.5–5.1)
Sodium: 124 mEq/L — ABNORMAL LOW (ref 135–145)

## 2020-02-01 LAB — PAN-ANCA
ANCA Proteinase 3: <3.5 U/mL (ref 0.0–3.5)
Atypical P-ANCA titer: <1:20 {titer}
C-ANCA: <1:20 {titer}
Myeloperoxidase Abs: <9 U/mL (ref 0.0–9.0)
P-ANCA: <1:20 {titer}

## 2020-02-01 LAB — CBC WITH DIFFERENTIAL/PLATELET
Basophils Absolute: 0 10*3/uL (ref 0.0–0.1)
Basophils Relative: 0.5 % (ref 0.0–3.0)
Eosinophils Absolute: 0 10*3/uL (ref 0.0–0.7)
Eosinophils Relative: 0.1 % (ref 0.0–5.0)
HCT: 22.8 % — CL (ref 39.0–52.0)
Hemoglobin: 7.6 g/dL — CL (ref 13.0–17.0)
Lymphocytes Relative: 17.9 % (ref 12.0–46.0)
Lymphs Abs: 1.4 10*3/uL (ref 0.7–4.0)
MCHC: 33.2 g/dL (ref 30.0–36.0)
MCV: 105.6 fl — ABNORMAL HIGH (ref 78.0–100.0)
Monocytes Absolute: 0.5 10*3/uL (ref 0.1–1.0)
Monocytes Relative: 6.8 % (ref 3.0–12.0)
Neutro Abs: 5.7 10*3/uL (ref 1.4–7.7)
Neutrophils Relative %: 74.7 % (ref 43.0–77.0)
Platelets: 478 10*3/uL — ABNORMAL HIGH (ref 150.0–400.0)
RBC: 2.16 Mil/uL — ABNORMAL LOW (ref 4.22–5.81)
RDW: 17.6 % — ABNORMAL HIGH (ref 11.5–15.5)
WBC: 7.7 10*3/uL (ref 4.0–10.5)

## 2020-02-01 LAB — HEPATIC FUNCTION PANEL
ALT: 8 U/L (ref 0–53)
AST: 13 U/L (ref 0–37)
Albumin: 3.7 g/dL (ref 3.5–5.2)
Alkaline Phosphatase: 48 U/L (ref 39–117)
Bilirubin, Direct: 0 mg/dL (ref 0.0–0.3)
Total Bilirubin: 0.3 mg/dL (ref 0.2–1.2)
Total Protein: 7.7 g/dL (ref 6.0–8.3)

## 2020-02-01 LAB — SARS CORONAVIRUS 2 (TAT 6-24 HRS): SARS Coronavirus 2: NEGATIVE

## 2020-02-01 MED ORDER — ALBUTEROL SULFATE (2.5 MG/3ML) 0.083% IN NEBU
2.5000 mg | INHALATION_SOLUTION | Freq: Once | RESPIRATORY_TRACT | Status: DC
  Filled 2020-02-01: qty 3

## 2020-02-01 NOTE — Telephone Encounter (Signed)
We can move him up to next week if we have the chair space.

## 2020-02-01 NOTE — Telephone Encounter (Signed)
Patient called reporting that his hgb is in the 7's and his infusion is not until July. He states he thinks it needs to be moved up. Then I got a call from Dr Bary Leriche office stating that f Dr Grayland Ormond can't do infusion she was going to send patient to ER. She does request that if Dr Grayland Ormond draws labs to please redraw a Sodium level on him. CBC w/Diff Order: [id number] Status:  Final result Visible to patient:  No (scheduled for 02/01/2020 4:25 PM) Next appt:  02/09/2020 at 10:45 AM in Radiology ([facility]) Dx:  DOE (dyspnea on exertion)  0 Result Notes  Ref Range & Units 10:24 3 wk ago 1 mo ago  WBC 4.0 - 10.5 K/uL 7.7  9.8  10.9High   RBC 4.22 - 5.81 Mil/uL 2.16Low  2.56Low R  2.43Low R   Hemoglobin 13.0 - 17.0 g/dL 7.6 Repeated and verified X2.Low Panic  9.3Low  8.0Low   HCT 39 - 52 % 22.8 Repeated and verified X2.Low Panic  29.2Low  25.6Low   MCV 78.0 - 100.0 fl 105.6High  114.1High R  105.3High R   MCHC 30.0 - 36.0 g/dL 33.2  31.8  31.3   RDW 11.5 - 15.5 % 17.6High  19.3High  14.5   Platelets 150 - 400 K/uL 478.0High  339  390   Neutrophils Relative % 43 - 77 % 74.7  84 R  85 R   Lymphocytes Relative 12 - 46 % 17.9  11 R  7 R   Monocytes Relative 3 - 12 % 6.8  4 R  7 R   Eosinophils Relative 0 - 5 % 0.1  0 R  0 R   Basophils Relative 0 - 3 % 0.5  0 R  0 R   Neutro Abs 1.4 - 7.7 K/uL 5.7  8.2High R  9.3High R   Lymphs Abs 0.7 - 4.0 K/uL 1.4  1.1  0.8   Monocytes Absolute 0 - 1 K/uL 0.5  0.4  0.7   Eosinophils Absolute 0 - 0 K/uL 0.0  0.0  0.0   Basophils Absolute 0 - 0 K/uL 0.0  0.0  0.0   MCH   36.3High R  32.9 R   nRBC   0.0 R  0.0 R   WBC Morphology   MORPHOLOGY UNREMARKABLE    RBC Morphology   MORPHOLOGY UNREMARKABLE    Smear Review   Normal platelet morphology    Immature Granulocytes   1 R  1 R   Abs Immature Granulocytes   0.07 R, CM  0.08High R, [place] [facility] [place] [facility]    Narrative Performed  by: Velora Heckler HARVEST Critical result called to ashley on 02/01/2020 2:54 PM by Delorise Jackson. Results were read back to caller. (HGB, HCT)    Specimen Collected: 02/01/20 10:24 Last Resulted: 02/01/20 14:54     Lab Flowsheet   Order Details   View Encounter   Lab and Collection Details   Routing   Result History     CM=Additional commentsR=Reference range differs from displayed range    Result Care Coordination  Patient Communication  02/01/2020 4:25 PM Release Now Not seen Back to Top      Other Results from 02/01/2020  Hepatic function panel  Status:  Final result Visible to patient:  No (scheduled for 02/01/2020 4:25 PM) Next appt:  02/09/2020 at 10:45 AM in Radiology ([facility]) Dx:  DOE (dyspnea on exertion) Order:  [id number]  0 Result Notes  Ref Range & Units 10:24 8 d ago 3 wk ago  Total Bilirubin 0.2 - 1.2 mg/dL 0.3  0.3  0.8 R   Bilirubin, Direct 0.0 - 0.3 mg/dL 0.0  0.0    Alkaline Phosphatase 39 - 117 U/L 48  56  52 R   AST 0 - 37 U/L 13  12  18  R   ALT 0 - 53 U/L 8  8  11  R   Total Protein 6.0 - 8.3 g/dL 7.7  7.8  8.5High R   Albumin 3.5 - 5.2 g/dL 3.7  3.7  3.6 R   Resulting Agency  [facility] [facility] [place] [facility] [place] [facility]      Specimen Collected: 02/01/20 10:24 Last Resulted: 02/01/20 15:25     Lab Flowsheet   Order Details   View Encounter   Lab and Collection Details   Routing   Result History     R=Reference range differs from displayed range    Result Care Coordination  Patient Communication  02/01/2020 4:25 PM Release Now Not seen Back to Top        Contains abnormal dataBasic Metabolic Panel (BMET)  Status:  Final result Visible to patient:  No (scheduled for 02/01/2020 4:25 PM) Next appt:  02/09/2020 at 10:45 AM in Radiology ([facility]) Dx:  DOE (dyspnea on exertion) Order: [id number]  0 Result Notes  Ref Range & Units 10:24 7 d ago 8 d ago  Sodium 135 - 145 mEq/L 124Low  126Low  124Low   Potassium  3.5 - 5.1 mEq/L 4.5   4.8   Chloride 96 - 112 mEq/L 93Low   92Low   CO2 19 - 32 mEq/L 23   24   Glucose, Bld 70 - 99 mg/dL 143High   72   BUN 6 - 23 mg/dL 18   17   Creatinine, Ser 0.40 - 1.50 mg/dL 1.12   1.01   GFR >60.00 mL/min 63.43   71.47   Calcium 8.4 - 10.5 mg/dL 9.6   9.7   Resulting [place] [facility]      Specimen Collected: 02/01/20 10:24 Last Resulted: 02/01/20 15:25

## 2020-02-01 NOTE — Telephone Encounter (Signed)
Received lab results from pulmonary.  Hgb 7.6.  sodium back down to 124.  See lab note from pulmonary.  He does see hematology and need to notify them of lab results.  Call and notify office of lab results.  Also, given decrease and decrease sodium - need to confirm no active bleeding.  Acute issues, etc - needs ER evaluation.  Needs to decrease alcohol intake.  Increased po food intake.  Confirm hematology f/u.  If not, then er for evaluation.  Needs f/u on sodium as well.

## 2020-02-01 NOTE — Telephone Encounter (Signed)
Called and spoke with patient. Confirmed no acute symptoms. No active bleeding. Advised of message below. Pt called hematology to notify them and is waiting on call back. Called over to hematology and spoke with triage nurse. She is sending over message to Dr Grayland Ormond for recommendations and will follow up with the patient. Noted that Dr Grayland Ormond may want more labs. If so, they are going to draw his sodium with other labs.

## 2020-02-01 NOTE — Telephone Encounter (Signed)
HBG continues to go down and is very low today at 7.6. On Eliquis, need to be seen by PCP immediately to evaluate  If active bleeding or worse need to go to ER  Please contact office for sooner follow up if symptoms do not improve or worsen or seek emergency care

## 2020-02-01 NOTE — Telephone Encounter (Signed)
Spoke with pt, aware of recs. Pt states that he sees a hematologist who manages his frequent blood transfusions for this issue. Pt denies any current active bleeding.  I advised pt to contact his hematologist for further recs.  I also advised pt to seek urgent/emergent care if he starts to experience active bleeding or other s/s to seek emergency care. Pt expressed understanding.  Nothing further needed at this time- will close encounter.

## 2020-02-01 NOTE — Telephone Encounter (Signed)
Spoke with Santiago Glad at [facility], with critical results on pt:  Hemoglobin:  7.6 Hematocrit: 22.8 Labs drawn today at 1024.   Sending to APP of the day since MR is in hospital.  Please advise, thanks!

## 2020-02-01 NOTE — Telephone Encounter (Signed)
Labs collected today.

## 2020-02-02 LAB — HYPERSENSITIVITY PNEUMONITIS
A. Pullulans Abs: NEGATIVE
A.Fumigatus #1 Abs: NEGATIVE
Micropolyspora faeni, IgG: NEGATIVE
Pigeon Serum Abs: NEGATIVE
Thermoact. Saccharii: NEGATIVE
Thermoactinomyces vulgaris, IgG: NEGATIVE

## 2020-02-02 NOTE — Telephone Encounter (Signed)
Pt has been contacted and scheduled for 6/24.

## 2020-02-06 NOTE — Progress Notes (Signed)
[facility]  [phone]) [phone] [phone]) [phone]  ID: Troy Lopez OB: 1941-04-28  MR#: [id number]  HEN#:[id number]  Patient Care Team: Einar Pheasant, MD as PCP - General (Internal Medicine) Lollie Sails, MD (Inactive) as Consulting Physician (Gastroenterology) Ok Edwards, NP as Nurse Practitioner (Gastroenterology) Lloyd Huger, MD as Consulting Physician (Oncology)  CHIEF COMPLAINT: [name]'s macroglobulinemia, iron deficiency anemia.  INTERVAL HISTORY: Patient returns to clinic as an add-on with declining hemoglobin and increasing weakness and fatigue.  He feels the iron infusion given to approximately 6 weeks ago did not help.  He has no neurologic complaints.  He denies any fevers.  He has a good appetite and denies weight loss.  He denies any chest pain, shortness of breath, cough, or hemoptysis.  He denies any nausea, vomiting, constipation, or diarrhea.  He denies any melena or hematochezia.  He has no urinary complaints.  Patient offers no further specific complaints today.  REVIEW OF SYSTEMS:   Review of Systems  Constitutional: Positive for malaise/fatigue. Negative for fever and weight loss.  Respiratory: Negative.  Negative for cough, hemoptysis and shortness of breath.   Cardiovascular: Negative.  Negative for chest pain and leg swelling.  Gastrointestinal: Negative.  Negative for abdominal pain, blood in stool, diarrhea and melena.  Genitourinary: Negative.  Negative for hematuria.  Musculoskeletal: Negative.  Negative for back pain.  Skin: Negative.  Negative for rash.  Neurological: Positive for weakness. Negative for dizziness, sensory change, focal weakness and headaches.  Psychiatric/Behavioral: Negative.  The patient is not nervous/anxious.     As per HPI. Otherwise, a complete review of systems is negative.  PAST MEDICAL HISTORY: Past Medical History:  Diagnosis Date  . Adrenal gland anomaly     enlargement  . Anxiety   . CAD (coronary artery disease)   . Carpal tunnel syndrome   . Chronic hyponatremia   . Colitis   . Colonic polyp   . COPD (chronic obstructive pulmonary disease) ([place])   . Degenerative disc disease, lumbar   . Depression   . Diverticulosis   . Dyspnea   . GERD (gastroesophageal reflux disease)   . H/O degenerative disc disease   . Hypercholesterolemia   . Hyperkalemia   . Hyperlipidemia   . Hypertension   . Irritable bowel syndrome   . Monoclonal gammopathy   . Monoclonal gammopathy   . Neuropathy   . Personal history of tobacco use, presenting hazards to health 08/17/2015  . Sleep apnea   . Vertebral compression fracture ([place])     PAST SURGICAL HISTORY: Past Surgical History:  Procedure Laterality Date  . BUNIONECTOMY  1989  . CARPAL TUNNEL RELEASE Left 2011   ulnar nerve sub muscular at elbow  . CHOLECYSTECTOMY  09/07  . COLONOSCOPY WITH PROPOFOL N/A 03/05/2017   Procedure: COLONOSCOPY WITH PROPOFOL;  Surgeon: Lucilla Lame, MD;  Location: [facility] ENDOSCOPY;  Service: Endoscopy;  Laterality: N/A;  . ELECTROPHYSIOLOGIC STUDY N/A 11/15/2015   Procedure: CARDIOVERSION;  Surgeon: Isaias Cowman, MD;  Location: [facility] ORS;  Service: Cardiovascular;  Laterality: N/A;  . ESOPHAGOGASTRODUODENOSCOPY (EGD) WITH PROPOFOL N/A 03/05/2017   Procedure: ESOPHAGOGASTRODUODENOSCOPY (EGD) WITH PROPOFOL;  Surgeon: Lucilla Lame, MD;  Location: [facility] ENDOSCOPY;  Service: Endoscopy;  Laterality: N/A;  . ESOPHAGOGASTRODUODENOSCOPY (EGD) WITH PROPOFOL N/A 10/17/2017   Procedure: ESOPHAGOGASTRODUODENOSCOPY (EGD) WITH PROPOFOL;  Surgeon: Lollie Sails, MD;  Location: [facility] ENDOSCOPY;  Service: Endoscopy;  Laterality: N/A;  . EYE SURGERY Bilateral 2010   cataract  . HEMORRHOID SURGERY    .  KYPHOPLASTY N/A 11/04/2016   Procedure: KYPHOPLASTY T 12;  Surgeon: Hessie Knows, MD;  Location: [facility] ORS;  Service: Orthopedics;  Laterality: N/A;    FAMILY HISTORY Family History    Problem Relation Age of Onset  . Stroke Mother   . Heart disease Father        MI - 36   . Colon cancer Neg Hx   . Prostate cancer Neg Hx        ADVANCED DIRECTIVES:    HEALTH MAINTENANCE: Social History   Tobacco Use  . Smoking status: Former Smoker    Packs/day: 2.00    Years: 58.00    Pack years: 116.00    Types: Cigarettes    Start date: 08/19/1957    Quit date: 11/01/2015    Years since quitting: 4.2  . Smokeless tobacco: Never Used  Vaping Use  . Vaping Use: Never used  Substance Use Topics  . Alcohol use: Yes    Alcohol/week: 42.0 standard drinks    Types: 42 Cans of beer per week    Comment: occas  . Drug use: No     Colonoscopy:  PAP:  Bone density:  Lipid panel:  Allergies  Allergen Reactions  . Iodinated Diagnostic Agents Other (See Comments)    Contraindication secondary to IGMgammopathy/Waldren's syndrome   Not to be given due to Waldenstrom's syndrome, told it could affect kidney function    Current Outpatient Medications  Medication Sig Dispense Refill  . acetaminophen (TYLENOL) 500 MG tablet Take 500-1,000 mg by mouth 3 (three) times daily as needed for moderate pain or headache.    . albuterol (VENTOLIN HFA) 108 (90 Base) MCG/ACT inhaler Inhale 2 puffs into the lungs every 6 (six) hours as needed for wheezing or shortness of breath. 54 g 3  . azelastine (ASTELIN) 0.1 % nasal spray Place 1 spray into both nostrils 2 (two) times daily. Use in each nostril as directed 30 mL 1  . budesonide (ENTOCORT EC) 3 MG 24 hr capsule Take 9 mg by mouth daily as needed (colitis flare).     . calcium-vitamin D (OSCAL WITH D) 500-200 MG-UNIT tablet Take 2 tablets by mouth daily.    Marland Kitchen doxycycline (VIBRA-TABS) 100 MG tablet Take 1 tablet (100 mg total) by mouth 2 (two) times daily. 10 tablet 0  . ELIQUIS 5 MG TABS tablet Take 5 mg by mouth 2 (two) times daily.     Marland Kitchen FIBER PO Take 1 capsule by mouth daily.     . fluticasone (FLONASE) 50 MCG/ACT nasal spray Place  1 spray into both nostrils daily. 16 g 2  . furosemide (LASIX) 20 MG tablet Take 1 tablet (20 mg total) by mouth daily as needed. (Patient taking differently: 40 mg. ) 30 tablet 0  . gabapentin (NEURONTIN) 100 MG capsule Take 4 capsules (400 mg) daily in the morning and midday. Take 8 capsules (800 mg) daily in the evening. 480 capsule 0  . isosorbide mononitrate (IMDUR) 30 MG 24 hr tablet Take 1 tablet (30 mg total) by mouth once daily.    . metoprolol tartrate (LOPRESSOR) 25 MG tablet Take 25 mg by mouth 2 (two) times daily.    . pantoprazole (PROTONIX) 40 MG tablet Take 1 tablet (40 mg total) by mouth 2 (two) times daily. 180 tablet 0  . predniSONE (DELTASONE) 10 MG tablet 4 tabs x2 days, 2 tabs x 2 days, 1 tab x 2 days then 0.5 tab x 2 days. 15 tablet 0  .  sertraline (ZOLOFT) 50 MG tablet Take 1 tablet (50 mg total) by mouth daily. 30 tablet 0  . triamcinolone ointment (KENALOG) 0.1 %     . vitamin B-12 (CYANOCOBALAMIN) 1000 MCG tablet Take 1,000 mcg by mouth daily.     . Fluticasone-Umeclidin-Vilant (TRELEGY ELLIPTA) 100-62.5-25 MCG/INH AEPB Inhale 1 puff into the lungs daily. (Patient not taking: Reported on 01/28/2020) 1 each 2   No current facility-administered medications for this visit.    OBJECTIVE: Vitals:   02/10/20 1015  BP: (!) 95/53  Pulse: 60  Resp: 20  Temp: 97.9 F (36.6 C)  SpO2: 100%     Body mass index is 25.84 kg/m.    ECOG FS:0 - Asymptomatic  General: Well-developed, well-nourished, no acute distress. Eyes: Pink conjunctiva, anicteric sclera. HEENT: Normocephalic, moist mucous membranes. Lungs: No audible wheezing or coughing. Heart: Regular rate and rhythm. Abdomen: Soft, nontender, no obvious distention. Musculoskeletal: No edema, cyanosis, or clubbing. Neuro: Alert, answering all questions appropriately. Cranial nerves grossly intact. Skin: No rashes or petechiae noted. Psych: Normal affect.  LAB RESULTS:  Lab Results  Component Value Date   NA 124  (L) 02/01/2020   K 4.5 02/01/2020   CL 93 (L) 02/01/2020   CO2 23 02/01/2020   GLUCOSE 143 (H) 02/01/2020   BUN 18 02/01/2020   CREATININE 1.12 02/01/2020   CALCIUM 9.6 02/01/2020   PROT 7.7 02/01/2020   ALBUMIN 3.7 02/01/2020   AST 13 02/01/2020   ALT 8 02/01/2020   ALKPHOS 48 02/01/2020   BILITOT 0.3 02/01/2020   GFRNONAA >60 01/06/2020   GFRAA >60 01/06/2020    Lab Results  Component Value Date   WBC 6.8 02/10/2020   NEUTROABS 5.1 02/10/2020   HGB 7.3 (L) 02/10/2020   HCT 23.4 (L) 02/10/2020   MCV 103.1 (H) 02/10/2020   PLT 409 (H) 02/10/2020   Lab Results  Component Value Date   IRON 54 02/10/2020   TIBC 861 (H) 02/10/2020   IRONPCTSAT 6 (L) 02/10/2020    Lab Results  Component Value Date   FERRITIN 21 (L) 02/10/2020   Lab Results  Component Value Date   TOTALPROTELP 8.2 10/19/2019   ALBUMINELP 3.4 10/19/2019   A1GS 0.3 10/19/2019   A2GS 0.6 10/19/2019   BETS 1.6 (H) 10/19/2019   BETA2SER 0.2 02/14/2016   GAMS 2.3 (H) 10/19/2019   MSPIKE Not Observed 10/19/2019   SPEI Comment 10/19/2019    STUDIES: CT Chest High Resolution  Result Date: 02/09/2020 CLINICAL DATA:  COPD.  Worsening dyspnea.  Former smoker. EXAM: CT CHEST WITHOUT CONTRAST TECHNIQUE: Multidetector CT imaging of the chest was performed following the standard protocol without intravenous contrast. High resolution imaging of the lungs, as well as inspiratory and expiratory imaging, was performed. COMPARISON:  07/08/2019 chest CT.  01/14/2020 chest radiograph. FINDINGS: Cardiovascular: Normal heart size. No significant pericardial effusion/thickening. Three-vessel coronary atherosclerosis. Atherosclerotic nonaneurysmal thoracic aorta. Dilated main pulmonary artery (3.9 cm diameter). Mediastinum/Nodes: No discrete thyroid nodules. Unremarkable esophagus. No pathologically enlarged axillary, mediastinal or hilar lymph nodes, noting limited sensitivity for the detection of hilar adenopathy on this  noncontrast study. Lungs/Pleura: No pneumothorax. No pleural effusion. Severe centrilobular and paraseptal emphysema with diffuse bronchial wall thickening and saber sheath trachea. No acute consolidative airspace disease or lung masses. Irregular 0.6 cm solid posterior right lower lobe pulmonary nodule (series 12/image 122), not definitely seen on prior chest CT. No additional significant pulmonary nodules. There are patchy confluent regions of punctate pulmonary parenchymal calcifications, most prominent in  dependent and basilar upper lobes bilaterally with associated mild patchy reticulation. No significant regions of traction bronchiectasis, architectural distortion or frank honeycombing. No significant regions of lobular air trapping or tracheobronchomalacia on the expiration sequence. These findings are unchanged since 07/08/2019 chest CT and have worsened since 05/20/2014 chest CT. Upper abdomen: Cholecystectomy. Stable 1.5 cm right adrenal adenoma with density 5 HU. Exophytic 1.0 cm isodense renal cortical lesion in the upper left kidney (series 6/image 156) with density 39 HU, not appreciably changed since 07/08/2019 CT. Musculoskeletal: No aggressive appearing focal osseous lesions. Moderate chronic L1 vertebral compression fracture status post vertebroplasty. Moderate thoracic spondylosis. IMPRESSION: 1. Severe centrilobular and paraseptal emphysema with diffuse bronchial wall thickening and saber sheath trachea, compatible with the provided history of COPD. 2. Irregular 0.6 cm solid posterior right lower lobe pulmonary nodule, not definitely seen on prior chest CT. Non-contrast chest CT at 6-12 months is recommended. If the nodule is stable at time of repeat CT, then future CT at 18-24 months (from today's scan) is considered optional for low-risk patients, but is recommended for high-risk patients. This recommendation follows the consensus statement: Guidelines for Management of Incidental Pulmonary  Nodules Detected on CT Images: From the Fleischner Society 2017; Radiology 2017; 284:228-243. 3. Patchy coalescent regions of pulmonary parenchymal calcification with associated mild fibrosis predominantly in the upper lobes, stable since 07/08/2019 chest CT, progressive since 05/20/2014 chest CT. 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 Suggest correlation with serum calcium. 4. Dilated main pulmonary artery, suggesting pulmonary arterial hypertension. 5. Stable right adrenal adenoma. 6. Indeterminate 1.0 cm upper left renal cortical lesion, not appreciably changed since 07/08/2019 CT. Further evaluation with renal mass protocol MRI (preferred) or CT abdomen without and with IV contrast is indicated and may be performed as clinically warranted given patient comorbidities. 7. Aortic Atherosclerosis (ICD10-I70.0) and Emphysema (ICD10-J43.9). Electronically Signed   By: Ilona Sorrel M.D.   On: 02/09/2020 14:29   Pulmonary Function Test [facility] Only  Result Date: 02/02/2020 Spirometry Data Is Acceptable and Reproducible Moderate Obstructive Airways Disease without  Significant Broncho-Dilator Response +hyperinflation Consider outpatient Pulmonary Consultation if needed Clinical Correlation Advised    ASSESSMENT: [name]'s macroglobulinemia, iron deficiency anemia.  PLAN:    1. [name]'s macroglobulinemia:  Bone marrow biopsy results from Jan 08, 2017 reviewed independently confirming underlying [name]'s.  Patient's M spike has been erratic ranging from 1.1 in May 2018 to as high as 2.6 in October 2018.  His most recent results on October 19, 2019 did not reveal an M spike, but continued to have a persistently elevated IgM level of 3755.  Kappa free light chains are essentially unchanged at 75.3. He has persistent iron deficiency anemia from a GI source, but no other evidence of endorgan damage. He does not require any treatment at this time. If he had progression of disease  as evidenced by B-symptoms or endorgan damage, will consider Rituxan-based therapy.  Continue to monitor every 3 to 6 months. 2. 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  This may need to be repeated in the near future.  Patient's hemoglobin has trended down is now 7.3 and his iron stores are significantly reduced.  Proceed with 510 mg IV Feraheme today.  Return to clinic tomorrow for 1 unit packed red blood cells.  Patient will then return to clinic in 4 weeks with repeat laboratory  work, further evaluation, and continuation of treatment.   3.  Back pain: Chronic and unchanged.  Continue monitoring and treatment per pain clinic.  I spent a total of 30 minutes reviewing chart data, face-to-face evaluation with the patient, counseling and coordination of care as detailed above.   Patient expressed understanding and was in agreement with this plan. He also understands that He can call clinic at any time with any questions, concerns, or complaints.    Lloyd Huger, MD   02/10/2020 1:58 PM

## 2020-02-08 ENCOUNTER — Ambulatory Visit (INDEPENDENT_AMBULATORY_CARE_PROVIDER_SITE_OTHER): Payer: PPO | Admitting: Pharmacist

## 2020-02-08 DIAGNOSIS — I4892 Unspecified atrial flutter: Secondary | ICD-10-CM

## 2020-02-08 DIAGNOSIS — J449 Chronic obstructive pulmonary disease, unspecified: Secondary | ICD-10-CM | POA: Diagnosis not present

## 2020-02-08 DIAGNOSIS — R0902 Hypoxemia: Secondary | ICD-10-CM | POA: Diagnosis not present

## 2020-02-09 ENCOUNTER — Encounter: Payer: Self-pay | Admitting: Oncology

## 2020-02-09 ENCOUNTER — Ambulatory Visit
Admission: RE | Admit: 2020-02-09 | Discharge: 2020-02-09 | Disposition: A | Discharge status: Home / Self Care | Payer: PPO | Source: Ambulatory Visit | Attending: Internal Medicine | Admitting: Internal Medicine

## 2020-02-09 ENCOUNTER — Other Ambulatory Visit: Payer: Self-pay

## 2020-02-09 DIAGNOSIS — J849 Interstitial pulmonary disease, unspecified: Secondary | ICD-10-CM | POA: Diagnosis not present

## 2020-02-09 DIAGNOSIS — J432 Centrilobular emphysema: Secondary | ICD-10-CM | POA: Diagnosis not present

## 2020-02-09 NOTE — Patient Instructions (Signed)
Visit Information  Goals Addressed              This Visit's Progress     Patient Stated   .  PharmD "I want to stay healthy (pt-stated)        CARE PLAN ENTRY (see longtitudinal plan of care for additional care plan information)  Current Barriers:  . Financial Barriers - reports concerns affording Trelegy and Eliquis at this time . Polypharmacy; complex patient with multiple medical conditions including aflutter, CAD/HTN, COPD/interstitial lung disease, MGUS, Waldenstrom macroglobulinemia, iron deficiency anemia; depression, chronic pain o IDA/MGUS, macroglobulinemia; Dr. Grayland Ormond. Infusion later this week d/t low Hgb on last lab work. o COPD: Trelegy daily, Ventolin PRN.  o CAD/HTN/A flutter: Eliquis 5 mg BID, metoprolol tartrate 25 mg BID, isosorbide mononitrate 30 mg QAM;  o Depression: sertraline 50 mg daily - may be contributing to hyponatremia o GERD: pantoprazole 40 mg BID o Chronic pain: Gabapentin 400 mg QAM, 400 mg midday, 800 mg QPM  Pharmacist Clinical Goal(s):  Marland Kitchen Over the next 90 days, patient will work with PharmD, CPhT, and providers to address needs related to medication access  and ensure optimized medication  Interventions: . Discussed patient assistance program options. Will begin process to apply for Eliquis assistance through [employer]. Will collaborate w/ CPhT to mail patient portion of the application to him. Unlikely that patient has spent 3% of total household income on copays, based on his memory of total drug spend. Emailed HTA contact for patient's OOP spend thus far. Will collaborate w/ CPhT to fax provider portion to Dr[facility] Sar[facility]yn Pilar at [facility] . Discussed Trelegy assistance. Patient will need to spend $600 on copays prior to qualifying for [place] assistance. Will start the application process, and communicate w/ HTA to determine patient's current OOP spend. If far from $600, will communicate w/ pulmonary about switching to [facility]. Will collaborate w/ CPhT to  mail patient portion of the application to him and fax provider portion to Dr. Chase Caller at [facility]  Patient Self Care Activities:  . Self administers medications as prescribed  Please see past updates related to this goal by clicking on the "Past Updates" button in the selected goal         The patient verbalized understanding of instructions provided today and declined a print copy of patient instruction materials.      Plan:  - Will collaborate w/ patient, provider, and CPhT as above - Will outreach as previously scheduled  Catie [name], PharmD, [place], [place] Pharmacist [facility] [phone]

## 2020-02-09 NOTE — Progress Notes (Signed)
I have reviewed the above note and agree. I was available to the pharmacist for consultation.  Sebastian Lurz, MD 

## 2020-02-09 NOTE — Progress Notes (Signed)
Patient called for pre assessment. He denies any pain today. States he has been having more trouble breathing and is feeling very tired. Denies any other concerns at this time.

## 2020-02-09 NOTE — Chronic Care Management (AMB) (Signed)
Chronic Care Management   Follow Up Note   02/09/2020 Name: Troy Lopez MRN: [id number] DOB: 1940-09-23  Referred by: Einar Pheasant, MD Reason for referral : Chronic Care Management (Medication Management)   Troy Lopez is a 79 y.o. year old male who is a primary care patient of Einar Pheasant, MD. The CCM team was consulted for assistance with chronic disease management and care coordination needs.    Contacted patient for medication management review.   Review of patient status, including review of consultants reports, relevant laboratory and other test results, and collaboration with appropriate care team members and the patient's provider was performed as part of comprehensive patient evaluation and provision of chronic care management services.    SDOH (Social Determinants of Health) assessments performed: Yes See Care Plan activities for detailed interventions related to [facility])     Outpatient Encounter Medications as of 02/08/2020  Medication Sig Note  . acetaminophen (TYLENOL) 500 MG tablet Take 500-1,000 mg by mouth 3 (three) times daily as needed for moderate pain or headache. 02/22/2019: 2 tablets 3 times daily   . albuterol (VENTOLIN HFA) 108 (90 Base) MCG/ACT inhaler Inhale 2 puffs into the lungs every 6 (six) hours as needed for wheezing or shortness of breath. 10/18/2019: Using 2-10 times daily   . azelastine (ASTELIN) 0.1 % nasal spray Place 1 spray into both nostrils 2 (two) times daily. Use in each nostril as directed   . budesonide (ENTOCORT EC) 3 MG 24 hr capsule Take 9 mg by mouth daily as needed (colitis flare).    . calcium-vitamin D (OSCAL WITH D) 500-200 MG-UNIT tablet Take 2 tablets by mouth daily.   Marland Kitchen doxycycline (VIBRA-TABS) 100 MG tablet Take 1 tablet (100 mg total) by mouth 2 (two) times daily.   Marland Kitchen ELIQUIS 5 MG TABS tablet Take 5 mg by mouth 2 (two) times daily.    Marland Kitchen FIBER PO Take 1 capsule by mouth daily.    . fluticasone (FLONASE) 50 MCG/ACT nasal  spray Place 1 spray into both nostrils daily.   . Fluticasone-Umeclidin-Vilant (TRELEGY ELLIPTA) 100-62.5-25 MCG/INH AEPB Inhale 1 puff into the lungs daily. (Patient not taking: Reported on 01/28/2020)   . furosemide (LASIX) 20 MG tablet Take 1 tablet (20 mg total) by mouth daily as needed. (Patient taking differently: 40 mg. ) 10/18/2019: PRN  . gabapentin (NEURONTIN) 100 MG capsule Take 4 capsules (400 mg) daily in the morning and midday. Take 8 capsules (800 mg) daily in the evening.   . isosorbide mononitrate (IMDUR) 30 MG 24 hr tablet Take 1 tablet (30 mg total) by mouth once daily.   . metoprolol tartrate (LOPRESSOR) 25 MG tablet Take 25 mg by mouth 2 (two) times daily.   . pantoprazole (PROTONIX) 40 MG tablet Take 1 tablet (40 mg total) by mouth 2 (two) times daily.   . predniSONE (DELTASONE) 10 MG tablet 4 tabs x2 days, 2 tabs x 2 days, 1 tab x 2 days then 0.5 tab x 2 days.   Marland Kitchen sertraline (ZOLOFT) 50 MG tablet Take 1 tablet (50 mg total) by mouth daily.   Marland Kitchen triamcinolone ointment (KENALOG) 0.1 %    . vitamin B-12 (CYANOCOBALAMIN) 1000 MCG tablet Take 1,000 mcg by mouth daily.    . [DISCONTINUED] amLODipine (NORVASC) 10 MG tablet Take 1 tablet (10 mg total) by mouth daily. (Patient taking differently: Take 5 mg by mouth daily. )   . [DISCONTINUED] metoprolol tartrate (LOPRESSOR) 100 MG tablet Take 1 tablet (100 mg  total) by mouth 2 (two) times daily. (Patient taking differently: 50 mg. Patient is taking 50mg  twice a day)    No facility-administered encounter medications on file as of 02/08/2020.     Objective:   Goals Addressed              This Visit's Progress     Patient Stated   .  PharmD "I want to stay healthy (pt-stated)        CARE PLAN ENTRY (see longtitudinal plan of care for additional care plan information)  Current Barriers:  . Financial Barriers - reports concerns affording Trelegy and Eliquis at this time . Polypharmacy; complex patient with multiple medical  conditions including aflutter, CAD/HTN, COPD/interstitial lung disease, MGUS, Waldenstrom macroglobulinemia, iron deficiency anemia; depression, chronic pain o IDA/MGUS, macroglobulinemia; Dr. Grayland Ormond. Infusion later this week d/t low Hgb on last lab work. o COPD: Trelegy daily, Ventolin PRN.  o CAD/HTN/A flutter: Eliquis 5 mg BID, metoprolol tartrate 25 mg BID, isosorbide mononitrate 30 mg QAM;  o Depression: sertraline 50 mg daily - may be contributing to hyponatremia o GERD: pantoprazole 40 mg BID o Chronic pain: Gabapentin 400 mg QAM, 400 mg midday, 800 mg QPM  Pharmacist Clinical Goal(s):  Marland Kitchen Over the next 90 days, patient will work with PharmD, CPhT, and providers to address needs related to medication access  and ensure optimized medication  Interventions: . Discussed patient assistance program options. Will begin process to apply for Eliquis assistance through [employer]. Will collaborate w/ CPhT to mail patient portion of the application to him. Unlikely that patient has spent 3% of total household income on copays, based on his memory of total drug spend. Emailed HTA contact for patient's OOP spend thus far. Will collaborate w/ CPhT to fax provider portion to Dr. Saralyn Pilar at [facility] . Discussed Trelegy assistance. Patient will need to spend $600 on copays prior to qualifying for [place] assistance. Will start the application process, and communicate w/ HTA to determine patient's current OOP spend. If far from $600, will communicate w/ pulmonary about switching to [facility]. Will collaborate w/ CPhT to mail patient portion of the application to him and fax provider portion to Dr. Chase Caller at [facility]  Patient Self Care Activities:  . Self administers medications as prescribed  Please see past updates related to this goal by clicking on the "Past Updates" button in the selected goal          Plan:  - Will collaborate w/ patient, provider, and CPhT as above - Will outreach  as previously scheduled  Catie [name], PharmD, [place], [place] Pharmacist [facility] [phone]

## 2020-02-10 ENCOUNTER — Inpatient Hospital Stay: Payer: PPO

## 2020-02-10 ENCOUNTER — Inpatient Hospital Stay: Payer: PPO | Attending: Oncology | Admitting: Oncology

## 2020-02-10 ENCOUNTER — Encounter: Payer: Self-pay | Admitting: Oncology

## 2020-02-10 VITALS — BP 138/90 | HR 56

## 2020-02-10 VITALS — BP 95/53 | HR 60 | Temp 97.9°F | Resp 20 | Wt 180.1 lb

## 2020-02-10 DIAGNOSIS — E785 Hyperlipidemia, unspecified: Secondary | ICD-10-CM | POA: Diagnosis not present

## 2020-02-10 DIAGNOSIS — Z7901 Long term (current) use of anticoagulants: Secondary | ICD-10-CM | POA: Insufficient documentation

## 2020-02-10 DIAGNOSIS — D509 Iron deficiency anemia, unspecified: Secondary | ICD-10-CM

## 2020-02-10 DIAGNOSIS — F329 Major depressive disorder, single episode, unspecified: Secondary | ICD-10-CM | POA: Insufficient documentation

## 2020-02-10 DIAGNOSIS — F419 Anxiety disorder, unspecified: Secondary | ICD-10-CM | POA: Insufficient documentation

## 2020-02-10 DIAGNOSIS — K219 Gastro-esophageal reflux disease without esophagitis: Secondary | ICD-10-CM | POA: Insufficient documentation

## 2020-02-10 DIAGNOSIS — Z87891 Personal history of nicotine dependence: Secondary | ICD-10-CM | POA: Insufficient documentation

## 2020-02-10 DIAGNOSIS — Z7952 Long term (current) use of systemic steroids: Secondary | ICD-10-CM | POA: Diagnosis not present

## 2020-02-10 DIAGNOSIS — K589 Irritable bowel syndrome without diarrhea: Secondary | ICD-10-CM | POA: Insufficient documentation

## 2020-02-10 DIAGNOSIS — E7800 Pure hypercholesterolemia, unspecified: Secondary | ICD-10-CM | POA: Diagnosis not present

## 2020-02-10 DIAGNOSIS — G4730 Sleep apnea, unspecified: Secondary | ICD-10-CM | POA: Diagnosis not present

## 2020-02-10 DIAGNOSIS — Z79899 Other long term (current) drug therapy: Secondary | ICD-10-CM | POA: Insufficient documentation

## 2020-02-10 DIAGNOSIS — I2510 Atherosclerotic heart disease of native coronary artery without angina pectoris: Secondary | ICD-10-CM | POA: Diagnosis not present

## 2020-02-10 DIAGNOSIS — I10 Essential (primary) hypertension: Secondary | ICD-10-CM | POA: Diagnosis not present

## 2020-02-10 DIAGNOSIS — Z9049 Acquired absence of other specified parts of digestive tract: Secondary | ICD-10-CM | POA: Insufficient documentation

## 2020-02-10 DIAGNOSIS — C880 Waldenstrom macroglobulinemia: Secondary | ICD-10-CM

## 2020-02-10 DIAGNOSIS — J449 Chronic obstructive pulmonary disease, unspecified: Secondary | ICD-10-CM | POA: Insufficient documentation

## 2020-02-10 LAB — SAMPLE TO BLOOD BANK

## 2020-02-10 LAB — IRON AND TIBC
Iron: 54 ug/dL (ref 45–182)
Saturation Ratios: 6 % — ABNORMAL LOW (ref 17.9–39.5)
TIBC: 861 ug/dL — ABNORMAL HIGH (ref 250–450)
UIBC: 807 ug/dL

## 2020-02-10 LAB — CBC WITH DIFFERENTIAL/PLATELET
Abs Immature Granulocytes: 0.02 10*3/uL (ref 0.00–0.07)
Basophils Absolute: 0 10*3/uL (ref 0.0–0.1)
Basophils Relative: 1 %
Eosinophils Absolute: 0.1 10*3/uL (ref 0.0–0.5)
Eosinophils Relative: 1 %
HCT: 23.4 % — ABNORMAL LOW (ref 39.0–52.0)
Hemoglobin: 7.3 g/dL — ABNORMAL LOW (ref 13.0–17.0)
Immature Granulocytes: 0 %
Lymphocytes Relative: 17 %
Lymphs Abs: 1.1 10*3/uL (ref 0.7–4.0)
MCH: 32.2 pg (ref 26.0–34.0)
MCHC: 31.2 g/dL (ref 30.0–36.0)
MCV: 103.1 fL — ABNORMAL HIGH (ref 80.0–100.0)
Monocytes Absolute: 0.5 10*3/uL (ref 0.1–1.0)
Monocytes Relative: 7 %
Neutro Abs: 5.1 10*3/uL (ref 1.7–7.7)
Neutrophils Relative %: 74 %
Platelets: 409 10*3/uL — ABNORMAL HIGH (ref 150–400)
RBC: 2.27 MIL/uL — ABNORMAL LOW (ref 4.22–5.81)
RDW: 17.1 % — ABNORMAL HIGH (ref 11.5–15.5)
WBC: 6.8 10*3/uL (ref 4.0–10.5)
nRBC: 0 % (ref 0.0–0.2)

## 2020-02-10 LAB — ABO/RH: ABO/RH(D): A POS

## 2020-02-10 LAB — FERRITIN: Ferritin: 21 ng/mL — ABNORMAL LOW (ref 24–336)

## 2020-02-10 MED ORDER — SODIUM CHLORIDE 0.9 % IV SOLN
510.0000 mg | Freq: Once | INTRAVENOUS | Status: AC
  Administered 2020-02-10: 510 mg via INTRAVENOUS
  Filled 2020-02-10: qty 510

## 2020-02-10 MED ORDER — SODIUM CHLORIDE 0.9 % IV SOLN
Freq: Once | INTRAVENOUS | Status: AC
  Filled 2020-02-10: qty 250

## 2020-02-11 ENCOUNTER — Inpatient Hospital Stay: Payer: PPO

## 2020-02-11 ENCOUNTER — Other Ambulatory Visit: Payer: Self-pay

## 2020-02-11 DIAGNOSIS — D509 Iron deficiency anemia, unspecified: Secondary | ICD-10-CM

## 2020-02-11 DIAGNOSIS — C880 Waldenstrom macroglobulinemia: Secondary | ICD-10-CM | POA: Diagnosis not present

## 2020-02-11 LAB — PREPARE RBC (CROSSMATCH)

## 2020-02-11 MED ORDER — DIPHENHYDRAMINE HCL 50 MG/ML IJ SOLN
25.0000 mg | Freq: Once | INTRAMUSCULAR | Status: AC
  Administered 2020-02-11: 25 mg via INTRAVENOUS
  Filled 2020-02-11: qty 1

## 2020-02-11 MED ORDER — SODIUM CHLORIDE 0.9% IV SOLUTION
250.0000 mL | Freq: Once | INTRAVENOUS | Status: AC
  Administered 2020-02-11: 250 mL via INTRAVENOUS
  Filled 2020-02-11: qty 250

## 2020-02-11 MED ORDER — ACETAMINOPHEN 325 MG PO TABS: 650.0000 mg | ORAL_TABLET | Freq: Once | ORAL | Status: DC

## 2020-02-11 NOTE — Progress Notes (Signed)
Pt tolerated blood transfusion well with no signs of complications. VSS. Pt stable for discharge. RN educated pt on the importance of calling the clinic if any complications occur at home and if it is an emergency to call 911. Pt verbalized understanding.   Donnarae Rae [employer]

## 2020-02-12 LAB — BPAM RBC
Blood Product Expiration Date: 202107202359
ISSUE DATE / TIME: 202106250959
Unit Type and Rh: 6200

## 2020-02-12 LAB — TYPE AND SCREEN
ABO/RH(D): A POS
Antibody Screen: NEGATIVE
Unit division: 0

## 2020-02-13 NOTE — Progress Notes (Signed)
Lot of findings on CT chest. Will discuss at followup  IMPRESSION: 1. Severe centrilobular and paraseptal emphysema with diffuse bronchial wall thickening and saber sheath trachea, compatible with the provided history of COPD. 2. Irregular 0.6 cm solid posterior right lower lobe pulmonary nodule, not definitely seen on prior chest CT. Non-contrast chest CT at 6-12 months is recommended. If the nodule is stable at time of repeat CT, then future CT at 18-24 months (from today's scan) is considered optional for low-risk patients, but is recommended for high-risk patients. This recommendation follows the consensus statement: Guidelines for Management of Incidental Pulmonary Nodules Detected on CT Images: From the Fleischner Society 2017; Radiology 2017; 284:228-243. 3. Patchy coalescent regions of pulmonary parenchymal calcification with associated mild fibrosis predominantly in the upper lobes, stable since 07/08/2019 chest CT, progressive since 05/20/2014 chest CT. 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 Suggest correlation with serum calcium. 4. Dilated main pulmonary artery, suggesting pulmonary arterial hypertension. 5. Stable right adrenal adenoma. 6. Indeterminate 1.0 cm upper left renal cortical lesion, not appreciably changed since 07/08/2019 CT. Further evaluation with renal mass protocol MRI (preferred) or CT abdomen without and with IV contrast is indicated and may be performed as clinically warranted given patient comorbidities. 7. Aortic Atherosclerosis (ICD10-I70.0) and Emphysema (ICD10-J43.9).   Electronically Signed   By: Ilona Sorrel M.D.   On: 02/09/2020 14:29

## 2020-02-14 ENCOUNTER — Other Ambulatory Visit: Payer: Self-pay | Admitting: Internal Medicine

## 2020-02-14 ENCOUNTER — Other Ambulatory Visit: Payer: Self-pay

## 2020-02-14 ENCOUNTER — Other Ambulatory Visit: Payer: Self-pay | Admitting: Pharmacy Technician

## 2020-02-14 ENCOUNTER — Telehealth: Payer: Self-pay | Admitting: Internal Medicine

## 2020-02-14 ENCOUNTER — Inpatient Hospital Stay: Payer: PPO | Attending: Oncology

## 2020-02-14 DIAGNOSIS — J449 Chronic obstructive pulmonary disease, unspecified: Secondary | ICD-10-CM

## 2020-02-14 DIAGNOSIS — D509 Iron deficiency anemia, unspecified: Secondary | ICD-10-CM | POA: Diagnosis not present

## 2020-02-14 LAB — CBC WITH DIFFERENTIAL/PLATELET
Abs Immature Granulocytes: 0.07 10*3/uL (ref 0.00–0.07)
Basophils Absolute: 0.1 10*3/uL (ref 0.0–0.1)
Basophils Relative: 1 %
Eosinophils Absolute: 0.1 10*3/uL (ref 0.0–0.5)
Eosinophils Relative: 1 %
HCT: 23.8 % — ABNORMAL LOW (ref 39.0–52.0)
Hemoglobin: 7.6 g/dL — ABNORMAL LOW (ref 13.0–17.0)
Immature Granulocytes: 1 %
Lymphocytes Relative: 18 %
Lymphs Abs: 1.5 10*3/uL (ref 0.7–4.0)
MCH: 33 pg (ref 26.0–34.0)
MCHC: 31.9 g/dL (ref 30.0–36.0)
MCV: 103.5 fL — ABNORMAL HIGH (ref 80.0–100.0)
Monocytes Absolute: 0.9 10*3/uL (ref 0.1–1.0)
Monocytes Relative: 10 %
Neutro Abs: 6.1 10*3/uL (ref 1.7–7.7)
Neutrophils Relative %: 69 %
Platelets: 351 10*3/uL (ref 150–400)
RBC: 2.3 MIL/uL — ABNORMAL LOW (ref 4.22–5.81)
RDW: 20.2 % — ABNORMAL HIGH (ref 11.5–15.5)
WBC: 8.7 10*3/uL (ref 4.0–10.5)
nRBC: 0.5 % — ABNORMAL HIGH (ref 0.0–0.2)

## 2020-02-14 LAB — IRON AND TIBC
Iron: 129 ug/dL (ref 45–182)
Saturation Ratios: 18 % (ref 17.9–39.5)
TIBC: 722 ug/dL — ABNORMAL HIGH (ref 250–450)
UIBC: 593 ug/dL

## 2020-02-14 LAB — SAMPLE TO BLOOD BANK

## 2020-02-14 LAB — FERRITIN: Ferritin: 261 ng/mL (ref 24–336)

## 2020-02-14 MED ORDER — ALBUTEROL SULFATE HFA 108 (90 BASE) MCG/ACT IN AERS: 2.0000 | INHALATION_SPRAY | Freq: Four times a day (QID) | RESPIRATORY_TRACT | 11 refills | Status: AC | PRN

## 2020-02-14 MED ORDER — TRELEGY ELLIPTA 100-62.5-25 MCG/INH IN AEPB: 1.0000 | INHALATION_SPRAY | Freq: Every day | RESPIRATORY_TRACT | 11 refills | Status: DC

## 2020-02-14 NOTE — Patient Outreach (Signed)
[facility]) Care Management  02/14/2020  Troy Lopez Jul 25, 1941 [id number]                                      Medication Assistance Referral  Referral From: [facility] Embedded RPh Catie T.   Medication/Company: Trelegy and Ventolin HFA / [place] Patient application portion:  Mailed Provider application portion: Faxed  to Dr. Brand Males Provider address/fax verified via: Office website  Medication/Company: Eliquis / [employer] Patient application portion:  Mailed Provider application portion: Faxed  to Dr. Isaias Cowman Provider address/fax verified via: Office website     Follow up:  Will follow up with patient in 5-10 business days to confirm application(s) have been received.  Emoni Yang P. Pansie Guggisberg, [place]  [phone]

## 2020-02-14 NOTE — Telephone Encounter (Signed)
Received Rx request for Trelegy and Ventolin from [facility] with [facility]. Rx has been printed and placed in Dr. Golden Pop folder for signature when in office on 02/18/2020.

## 2020-02-18 NOTE — Telephone Encounter (Signed)
Rx for trelegy and Ventolin has been signed and faxed back to [place] with [facility]. Received fax confirmation.  Nothing further is needed.

## 2020-02-22 ENCOUNTER — Other Ambulatory Visit: Payer: PPO

## 2020-02-22 ENCOUNTER — Ambulatory Visit: Payer: PPO

## 2020-02-22 ENCOUNTER — Ambulatory Visit: Payer: PPO | Admitting: Oncology

## 2020-02-23 ENCOUNTER — Other Ambulatory Visit: Payer: Self-pay | Admitting: Internal Medicine

## 2020-02-24 ENCOUNTER — Telehealth: Payer: Self-pay | Admitting: Internal Medicine

## 2020-02-24 NOTE — Chronic Care Management (AMB) (Signed)
  Care Management   Note  02/24/2020 Name: Troy Lopez MRN: [id number] DOB: 21-Jul-1941  Troy Lopez is a 79 y.o. year old male who is a primary care patient of Einar Pheasant, MD and is actively engaged with the care management team. I reached out to [place] by phone today to assist with re-scheduling a follow up visit with the Pharmacist  Follow up plan: Telephone appointment with care management team member scheduled for:03/21/2020  Noreene Larsson, [place], [place], [place] [address] Direct Dial: [phone] [email or web address] Website: [email or web address]

## 2020-02-25 ENCOUNTER — Ambulatory Visit: Payer: Self-pay | Admitting: Pharmacist

## 2020-02-25 ENCOUNTER — Other Ambulatory Visit: Payer: Self-pay | Admitting: Pharmacy Technician

## 2020-02-25 DIAGNOSIS — M810 Age-related osteoporosis without current pathological fracture: Secondary | ICD-10-CM | POA: Diagnosis not present

## 2020-02-25 DIAGNOSIS — I4892 Unspecified atrial flutter: Secondary | ICD-10-CM

## 2020-02-25 DIAGNOSIS — J449 Chronic obstructive pulmonary disease, unspecified: Secondary | ICD-10-CM

## 2020-02-25 NOTE — Chronic Care Management (AMB) (Signed)
Chronic Care Management   Follow Up Note   02/25/2020 Name: Troy Lopez MRN: [id number] DOB: 1941-06-26  Referred by: Einar Pheasant, MD Reason for referral : Chronic Care Management (Medication Management)   Troy Lopez is a 79 y.o. year old male who is a primary care patient of Einar Pheasant, MD. The CCM team was consulted for assistance with chronic disease management and care coordination needs.    Review of patient status, including review of consultants reports, relevant laboratory and other test results, and collaboration with appropriate care team members and the patient's provider was performed as part of comprehensive patient evaluation and provision of chronic care management services.    SDOH (Social Determinants of Health) assessments performed: Yes See Care Plan activities for detailed interventions related to SDOH)  SDOH Interventions     Most Recent Value  SDOH Interventions  SDOH Interventions for the Following Domains Financial Strain  Financial Strain Interventions Other (Comment)  [medication assistance]       Outpatient Encounter Medications as of 02/25/2020  Medication Sig Note   acetaminophen (TYLENOL) 500 MG tablet Take 500-1,000 mg by mouth 3 (three) times daily as needed for moderate pain or headache. 02/22/2019: 2 tablets 3 times daily    albuterol (VENTOLIN HFA) 108 (90 Base) MCG/ACT inhaler Inhale 2 puffs into the lungs every 6 (six) hours as needed for wheezing or shortness of breath.    azelastine (ASTELIN) 0.1 % nasal spray Place 1 spray into both nostrils 2 (two) times daily. Use in each nostril as directed    budesonide (ENTOCORT EC) 3 MG 24 hr capsule Take 9 mg by mouth daily as needed (colitis flare).     calcium-vitamin D (OSCAL WITH D) 500-200 MG-UNIT tablet Take 2 tablets by mouth daily.    doxycycline (VIBRA-TABS) 100 MG tablet Take 1 tablet (100 mg total) by mouth 2 (two) times daily.    ELIQUIS 5 MG TABS tablet Take 5 mg by mouth  2 (two) times daily.     FIBER PO Take 1 capsule by mouth daily.     fluticasone (FLONASE) 50 MCG/ACT nasal spray Place 1 spray into both nostrils daily.    Fluticasone-Umeclidin-Vilant (TRELEGY ELLIPTA) 100-62.5-25 MCG/INH AEPB Inhale 1 puff into the lungs daily.    furosemide (LASIX) 20 MG tablet Take 1 tablet (20 mg total) by mouth daily as needed. (Patient taking differently: 40 mg. ) 10/18/2019: PRN   gabapentin (NEURONTIN) 100 MG capsule Take 4 capsules (400 mg) daily in the morning and midday. Take 8 capsules (800 mg) daily in the evening.    isosorbide mononitrate (IMDUR) 30 MG 24 hr tablet Take 1 tablet (30 mg total) by mouth once daily.    metoprolol tartrate (LOPRESSOR) 25 MG tablet Take 25 mg by mouth 2 (two) times daily.    pantoprazole (PROTONIX) 40 MG tablet Take 1 tablet (40 mg total) by mouth 2 (two) times daily.    predniSONE (DELTASONE) 10 MG tablet 4 tabs x2 days, 2 tabs x 2 days, 1 tab x 2 days then 0.5 tab x 2 days.    sertraline (ZOLOFT) 50 MG tablet Take 1 tablet (50 mg total) by mouth daily.    triamcinolone ointment (KENALOG) 0.1 %     vitamin B-12 (CYANOCOBALAMIN) 1000 MCG tablet Take 1,000 mcg by mouth daily.     No facility-administered encounter medications on file as of 02/25/2020.     Objective:   Goals Addressed  This Visit's Progress     Patient Stated     PharmD "I want to stay healthy (pt-stated)        CARE PLAN ENTRY (see longtitudinal plan of care for additional care plan information)  Current Barriers:   Financial Barriers - reports concerns affording Trelegy and Eliquis at this time. Patient never received paperwork for Eliquis or Trelegy in the mail when previously sent by CPhT.   Polypharmacy; complex patient with multiple medical conditions including aflutter, CAD/HTN, COPD/interstitial lung disease, MGUS, Waldenstrom macroglobulinemia, iron deficiency anemia; depression, chronic pain o IDA/MGUS, macroglobulinemia;  Dr. Grayland Ormond. o COPD: Trelegy daily, Ventolin PRN.  o CAD/HTN/A flutter: Eliquis 5 mg BID, metoprolol tartrate 25 mg BID, isosorbide mononitrate 30 mg QAM;  o Depression: sertraline 50 mg daily o GERD: pantoprazole 40 mg BID o Chronic pain: Gabapentin 400 mg QAM, 400 mg midday, 800 mg QPM  Pharmacist Clinical Goal(s):   Over the next 90 days, patient will work with PharmD, CPhT, and providers to address needs related to medication access  and ensure optimized medication  Interventions:  Comprehensive medication review performed, medication list updated in electronic medical record  Inter-disciplinary care team collaboration (see longitudinal plan of care)  Collaborated w/ CPhT to mail Eliquis and Trelegy patient assistance paperwork back out to patient. Reminded patient that we would need proof of out of pocket spend.   Patient Self Care Activities:   Self administers medications as prescribed  Please see past updates related to this goal by clicking on the "Past Updates" button in the selected goal          Plan:  - Will outreach as previously scheduled  Catie [name], PharmD, [place], [place] Pharmacist [place] [phone]

## 2020-02-25 NOTE — Patient Instructions (Signed)
Visit Information  Goals Addressed              This Visit's Progress     Patient Stated     PharmD "I want to stay healthy (pt-stated)        CARE PLAN ENTRY (see longtitudinal plan of care for additional care plan information)  Current Barriers:   Financial Barriers - reports concerns affording Trelegy and Eliquis at this time. Patient never received paperwork for Eliquis or Trelegy in the mail when previously sent by CPhT.   Polypharmacy; complex patient with multiple medical conditions including aflutter, CAD/HTN, COPD/interstitial lung disease, MGUS, Waldenstrom macroglobulinemia, iron deficiency anemia; depression, chronic pain o IDA/MGUS, macroglobulinemia; Dr. Grayland Ormond. o COPD: Trelegy daily, Ventolin PRN.  o CAD/HTN/A flutter: Eliquis 5 mg BID, metoprolol tartrate 25 mg BID, isosorbide mononitrate 30 mg QAM;  o Depression: sertraline 50 mg daily o GERD: pantoprazole 40 mg BID o Chronic pain: Gabapentin 400 mg QAM, 400 mg midday, 800 mg QPM  Pharmacist Clinical Goal(s):   Over the next 90 days, patient will work with PharmD, CPhT, and providers to address needs related to medication access  and ensure optimized medication  Interventions:  Comprehensive medication review performed, medication list updated in electronic medical record  Inter-disciplinary care team collaboration (see longitudinal plan of care)  Collaborated w/ CPhT to mail Eliquis and Trelegy patient assistance paperwork back out to patient. Reminded patient that we would need proof of out of pocket spend.   Patient Self Care Activities:   Self administers medications as prescribed  Please see past updates related to this goal by clicking on the "Past Updates" button in the selected goal         The patient verbalized understanding of instructions provided today and declined a print copy of patient instruction materials.    Plan:  - Will outreach as previously scheduled  Catie [name],  PharmD, [place], [place] Pharmacist [place] [phone]

## 2020-02-25 NOTE — Patient Outreach (Signed)
[facility]) Care Management  02/25/2020  Sevastian Witczak Warshawsky 1940/12/10 [id number]  Received in basket message from embedded [place] that patient has never received the applications for Eliquis with [employer] and Trelegy with [employer] that were mailed to him on 02/14/2020.  Prepared another set of applications that will be mailed to patient today 02/25/2020.  Will follow up with patient in 5-15 business days to inquire if he has received them.  Tylor Courtwright P. Stephanee Barcomb, [place]  [phone]

## 2020-02-25 NOTE — Progress Notes (Signed)
I have reviewed the above note and agree. I was available to the pharmacist for consultation.  Genisis Sonnier, MD 

## 2020-03-06 ENCOUNTER — Telehealth: Payer: PPO

## 2020-03-07 ENCOUNTER — Encounter: Payer: Self-pay | Admitting: Internal Medicine

## 2020-03-07 ENCOUNTER — Ambulatory Visit (INDEPENDENT_AMBULATORY_CARE_PROVIDER_SITE_OTHER): Payer: PPO | Admitting: Internal Medicine

## 2020-03-07 ENCOUNTER — Other Ambulatory Visit: Payer: Self-pay | Admitting: Pharmacy Technician

## 2020-03-07 ENCOUNTER — Other Ambulatory Visit: Payer: Self-pay

## 2020-03-07 DIAGNOSIS — C880 Waldenstrom macroglobulinemia: Secondary | ICD-10-CM

## 2020-03-07 DIAGNOSIS — E871 Hypo-osmolality and hyponatremia: Secondary | ICD-10-CM

## 2020-03-07 DIAGNOSIS — I272 Pulmonary hypertension, unspecified: Secondary | ICD-10-CM | POA: Diagnosis not present

## 2020-03-07 DIAGNOSIS — K219 Gastro-esophageal reflux disease without esophagitis: Secondary | ICD-10-CM | POA: Diagnosis not present

## 2020-03-07 DIAGNOSIS — J449 Chronic obstructive pulmonary disease, unspecified: Secondary | ICD-10-CM

## 2020-03-07 DIAGNOSIS — G479 Sleep disorder, unspecified: Secondary | ICD-10-CM | POA: Diagnosis not present

## 2020-03-07 DIAGNOSIS — G629 Polyneuropathy, unspecified: Secondary | ICD-10-CM | POA: Diagnosis not present

## 2020-03-07 DIAGNOSIS — I10 Essential (primary) hypertension: Secondary | ICD-10-CM | POA: Diagnosis not present

## 2020-03-07 DIAGNOSIS — D509 Iron deficiency anemia, unspecified: Secondary | ICD-10-CM | POA: Diagnosis not present

## 2020-03-07 DIAGNOSIS — F320 Major depressive disorder, single episode, mild: Secondary | ICD-10-CM

## 2020-03-07 DIAGNOSIS — I4892 Unspecified atrial flutter: Secondary | ICD-10-CM

## 2020-03-07 DIAGNOSIS — G4730 Sleep apnea, unspecified: Secondary | ICD-10-CM

## 2020-03-07 DIAGNOSIS — N289 Disorder of kidney and ureter, unspecified: Secondary | ICD-10-CM

## 2020-03-07 DIAGNOSIS — F32A Depression, unspecified: Secondary | ICD-10-CM

## 2020-03-07 DIAGNOSIS — I2510 Atherosclerotic heart disease of native coronary artery without angina pectoris: Secondary | ICD-10-CM

## 2020-03-07 NOTE — Patient Outreach (Signed)
[facility]) Care Management  03/07/2020  Troy Lopez 1941-07-14 [id number]  Received both patient and provider portion(s) of patient assistance application(s) for Eliquis, Trelegy & Ventolin HFA. Faxed completed application and required documents into [employer] & [employer] respectively.  Will follow up with company(ies) in 5-7 business days to check status of application(s).  Troy Lopez, [place]  [phone]

## 2020-03-07 NOTE — Progress Notes (Signed)
Patient ID: Troy Lopez, male   DOB: September 25, 1940, 78 y.o.   MRN: [id number]   Subjective:    Patient ID: Troy Lopez, male    DOB: Feb 16, 1941, 79 y.o.   MRN: [id number]  HPI This visit occurred during the SARS-CoV-2 public health emergency.  Safety protocols were in place, including screening questions prior to the visit, additional usage of staff PPE, and extensive cleaning of exam room while observing appropriate contact time as indicated for disinfecting solutions.  Patient here for a scheduled follow up.  He reports still not feeling well.  Has been seeing Dr Grayland Ormond for [place].  Receiving iron infusions.  Was also transfused.  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  Currently stable.  Recommended following q 3-6 months.  Still with increased fatigue.  Still with sob.  Due to f/u with pulmonary this week.  No change in cough or congestion. Had overnight oximetry.  Oxygen level was low.  Plans to discuss with pulmonary regarding oxygen.   He reports not sleeping, but on questioning him - he is sleeping just has his hours shifted.  He is falling asleep early am and sleeping on into the day.  Increased alcohol  Intake at night.  Plans to discuss recent CT results with pulmonary.  Radiology did comment on a renal mass with recommendation for further evaluation - MRI or CT abdomen.  Will d/w Dr Grayland Ormond.  Weight is down some.  No abdominal pain.  Bowels moving.  No diarrhea.    Past Medical History:  Diagnosis Date  . Adrenal gland anomaly    enlargement  . Anxiety   . CAD (coronary artery disease)   . Carpal tunnel syndrome   . Chronic hyponatremia   . Colitis   . Colonic polyp   . COPD (chronic obstructive pulmonary disease) ([place])   . Degenerative disc disease, lumbar   . Depression   . Diverticulosis   . Dyspnea   . GERD (gastroesophageal reflux disease)   . H/O degenerative disc disease   . Hypercholesterolemia   . Hyperkalemia   . Hyperlipidemia   .  Hypertension   . Irritable bowel syndrome   . Monoclonal gammopathy   . Monoclonal gammopathy   . Neuropathy   . Personal history of tobacco use, presenting hazards to health 08/17/2015  . Sleep apnea   . Vertebral compression fracture [facility])    Past Surgical History:  Procedure Laterality Date  . BUNIONECTOMY  1989  . CARPAL TUNNEL RELEASE Left 2011   ulnar nerve sub muscular at elbow  . CHOLECYSTECTOMY  09/07  . COLONOSCOPY WITH PROPOFOL N/A 03/05/2017   Procedure: COLONOSCOPY WITH PROPOFOL;  Surgeon: Lucilla Lame, MD;  Location: [facility] ENDOSCOPY;  Service: Endoscopy;  Laterality: N/A;  . ELECTROPHYSIOLOGIC STUDY N/A 11/15/2015   Procedure: CARDIOVERSION;  Surgeon: Isaias Cowman, MD;  Location: [facility] ORS;  Service: Cardiovascular;  Laterality: N/A;  . ESOPHAGOGASTRODUODENOSCOPY (EGD) WITH PROPOFOL N/A 03/05/2017   Procedure: ESOPHAGOGASTRODUODENOSCOPY (EGD) WITH PROPOFOL;  Surgeon: Lucilla Lame, MD;  Location: [facility] ENDOSCOPY;  Service: Endoscopy;  Laterality: N/A;  . ESOPHAGOGASTRODUODENOSCOPY (EGD) WITH PROPOFOL N/A 10/17/2017   Procedure: ESOPHAGOGASTRODUODENOSCOPY (EGD) WITH PROPOFOL;  Surgeon: Lollie Sails, MD;  Location: [facility] ENDOSCOPY;  Service: Endoscopy;  Laterality: N/A;  . EYE SURGERY Bilateral 2010   cataract  . HEMORRHOID SURGERY    . KYPHOPLASTY N/A 11/04/2016   Procedure: KYPHOPLASTY T 12;  Surgeon: Hessie Knows, MD;  Location: [facility] ORS;  Service: Orthopedics;  Laterality: N/A;  Family History  Problem Relation Age of Onset  . Stroke Mother   . Heart disease Father        MI - 42   . Colon cancer Neg Hx   . Prostate cancer Neg Hx    Social History   Socioeconomic History  . Marital status: Married    Spouse name: Not on file  . Number of children: 3  . Years of education: Not on file  . Highest education level: Not on file  Occupational History  . Not on file  Tobacco Use  . Smoking status: Former Smoker    Packs/day: 2.00    Years: 58.00    Pack  years: 116.00    Types: Cigarettes    Start date: 08/19/1957    Quit date: 11/01/2015    Years since quitting: 4.3  . Smokeless tobacco: Never Used  Vaping Use  . Vaping Use: Never used  Substance and Sexual Activity  . Alcohol use: Yes    Alcohol/week: 42.0 standard drinks    Types: 42 Cans of beer per week    Comment: occas  . Drug use: No  . Sexual activity: Not on file  Other Topics Concern  . Not on file  Social History Narrative  . Not on file   Social Determinants of Health   Financial Resource Strain:   . Difficulty of Paying Living Expenses:   Food Insecurity:   . Worried About Charity fundraiser in the Last Year:   . Arboriculturist in the Last Year:   Transportation Needs:   . Film/video editor (Medical):   Marland Kitchen Lack of Transportation (Non-Medical):   Physical Activity:   . Days of Exercise per Week:   . Minutes of Exercise per Session:   Stress:   . Feeling of Stress :   Social Connections:   . Frequency of Communication with Friends and Family:   . Frequency of Social Gatherings with Friends and Family:   . Attends Religious Services:   . Active Member of Clubs or Organizations:   . Attends Archivist Meetings:   Marland Kitchen Marital Status:     Outpatient Encounter Medications as of 03/07/2020  Medication Sig  . acetaminophen (TYLENOL) 500 MG tablet Take 500-1,000 mg by mouth 3 (three) times daily as needed for moderate pain or headache.  . albuterol (VENTOLIN HFA) 108 (90 Base) MCG/ACT inhaler Inhale 2 puffs into the lungs every 6 (six) hours as needed for wheezing or shortness of breath.  Marland Kitchen azelastine (ASTELIN) 0.1 % nasal spray Place 1 spray into both nostrils 2 (two) times daily. Use in each nostril as directed  . budesonide (ENTOCORT EC) 3 MG 24 hr capsule Take 9 mg by mouth daily as needed (colitis flare).   . calcium-vitamin D (OSCAL WITH D) 500-200 MG-UNIT tablet Take 2 tablets by mouth daily.  Marland Kitchen doxycycline (VIBRA-TABS) 100 MG tablet Take 1  tablet (100 mg total) by mouth 2 (two) times daily.  Marland Kitchen ELIQUIS 5 MG TABS tablet Take 5 mg by mouth 2 (two) times daily.   Marland Kitchen FIBER PO Take 1 capsule by mouth daily.   . fluticasone (FLONASE) 50 MCG/ACT nasal spray Place 1 spray into both nostrils daily.  . Fluticasone-Umeclidin-Vilant (TRELEGY ELLIPTA) 100-62.5-25 MCG/INH AEPB Inhale 1 puff into the lungs daily.  . furosemide (LASIX) 20 MG tablet Take 1 tablet (20 mg total) by mouth daily as needed. (Patient taking differently: 40 mg. )  . gabapentin (NEURONTIN)  100 MG capsule Take 4 capsules (400 mg) daily in the morning and midday. Take 8 capsules (800 mg) daily in the evening.  . isosorbide mononitrate (IMDUR) 30 MG 24 hr tablet Take 1 tablet (30 mg total) by mouth once daily. (Patient not taking: Reported on 03/10/2020)  . metoprolol tartrate (LOPRESSOR) 25 MG tablet Take 25 mg by mouth 2 (two) times daily.  . pantoprazole (PROTONIX) 40 MG tablet Take 1 tablet (40 mg total) by mouth 2 (two) times daily.  . sertraline (ZOLOFT) 50 MG tablet Take 1 tablet (50 mg total) by mouth daily.  Marland Kitchen triamcinolone ointment (KENALOG) 0.1 %   . vitamin B-12 (CYANOCOBALAMIN) 1000 MCG tablet Take 1,000 mcg by mouth daily.   . [DISCONTINUED] predniSONE (DELTASONE) 10 MG tablet 4 tabs x2 days, 2 tabs x 2 days, 1 tab x 2 days then 0.5 tab x 2 days.   No facility-administered encounter medications on file as of 03/07/2020.    Review of Systems     Objective:    Physical Exam  BP 122/78   Pulse 69   Temp 97.8 F (36.6 C)   Resp 16   Ht 5\' 10"  (1.778 m)   Wt 177 lb (80.3 kg)   SpO2 98%   BMI 25.40 kg/m  Wt Readings from Last 3 Encounters:  03/10/20 177 lb (80.3 kg)  03/07/20 177 lb (80.3 kg)  02/10/20 180 lb 1.6 oz (81.7 kg)     Lab Results  Component Value Date   WBC 8.7 02/14/2020   HGB 7.6 (L) 02/14/2020   HCT 23.8 (L) 02/14/2020   PLT 351 02/14/2020   GLUCOSE 143 (H) 02/01/2020   CHOL 145 01/24/2020   TRIG 56.0 01/24/2020   HDL 68.30  01/24/2020   LDLCALC 65 01/24/2020   ALT 8 02/01/2020   AST 13 02/01/2020   NA 124 (L) 02/01/2020   K 4.5 02/01/2020   CL 93 (L) 02/01/2020   CREATININE 1.12 02/01/2020   BUN 18 02/01/2020   CO2 23 02/01/2020   TSH 1.98 01/24/2020   PSA 1.48 01/24/2020   INR 1.11 01/08/2017   HGBA1C 4.6 01/24/2020   MICROALBUR 1.9 10/27/2017    CT Chest High Resolution  Result Date: 02/09/2020 CLINICAL DATA:  COPD.  Worsening dyspnea.  Former smoker. EXAM: CT CHEST WITHOUT CONTRAST TECHNIQUE: Multidetector CT imaging of the chest was performed following the standard protocol without intravenous contrast. High resolution imaging of the lungs, as well as inspiratory and expiratory imaging, was performed. COMPARISON:  07/08/2019 chest CT.  01/14/2020 chest radiograph. FINDINGS: Cardiovascular: Normal heart size. No significant pericardial effusion/thickening. Three-vessel coronary atherosclerosis. Atherosclerotic nonaneurysmal thoracic aorta. Dilated main pulmonary artery (3.9 cm diameter). Mediastinum/Nodes: No discrete thyroid nodules. Unremarkable esophagus. No pathologically enlarged axillary, mediastinal or hilar lymph nodes, noting limited sensitivity for the detection of hilar adenopathy on this noncontrast study. Lungs/Pleura: No pneumothorax. No pleural effusion. Severe centrilobular and paraseptal emphysema with diffuse bronchial wall thickening and saber sheath trachea. No acute consolidative airspace disease or lung masses. Irregular 0.6 cm solid posterior right lower lobe pulmonary nodule (series 12/image 122), not definitely seen on prior chest CT. No additional significant pulmonary nodules. There are patchy confluent regions of punctate pulmonary parenchymal calcifications, most prominent in dependent and basilar upper lobes bilaterally with associated mild patchy reticulation. No significant regions of traction bronchiectasis, architectural distortion or frank honeycombing. No significant regions of  lobular air trapping or tracheobronchomalacia on the expiration sequence. These findings are unchanged since 07/08/2019 chest  CT and have worsened since 05/20/2014 chest CT. Upper abdomen: Cholecystectomy. Stable 1.5 cm right adrenal adenoma with density 5 HU. Exophytic 1.0 cm isodense renal cortical lesion in the upper left kidney (series 6/image 156) with density 39 HU, not appreciably changed since 07/08/2019 CT. Musculoskeletal: No aggressive appearing focal osseous lesions. Moderate chronic L1 vertebral compression fracture status post vertebroplasty. Moderate thoracic spondylosis. IMPRESSION: 1. Severe centrilobular and paraseptal emphysema with diffuse bronchial wall thickening and saber sheath trachea, compatible with the provided history of COPD. 2. Irregular 0.6 cm solid posterior right lower lobe pulmonary nodule, not definitely seen on prior chest CT. Non-contrast chest CT at 6-12 months is recommended. If the nodule is stable at time of repeat CT, then future CT at 18-24 months (from today's scan) is considered optional for low-risk patients, but is recommended for high-risk patients. This recommendation follows the consensus statement: Guidelines for Management of Incidental Pulmonary Nodules Detected on CT Images: From the Fleischner Society 2017; Radiology 2017; 284:228-243. 3. Patchy coalescent regions of pulmonary parenchymal calcification with associated mild fibrosis predominantly in the upper lobes, stable since 07/08/2019 chest CT, progressive since 05/20/2014 chest CT. 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 Suggest correlation with serum calcium. 4. Dilated main pulmonary artery, suggesting pulmonary arterial hypertension. 5. Stable right adrenal adenoma. 6. Indeterminate 1.0 cm upper left renal cortical lesion, not appreciably changed since 07/08/2019 CT. Further evaluation with renal mass protocol MRI (preferred) or CT abdomen without and with IV contrast is  indicated and may be performed as clinically warranted given patient comorbidities. 7. Aortic Atherosclerosis (ICD10-I70.0) and Emphysema (ICD10-J43.9). Electronically Signed   By: Ilona Sorrel M.D.   On: 02/09/2020 14:29       Assessment & Plan:   Problem List Items Addressed This Visit    Atrial flutter [facility])    S/p cardioversion.  On eliquis.  Stable.  Continue f/u with cardiology.       CAD (coronary artery disease)    Followed by cardiology.  Continue risk factor modification.       GERD (gastroesophageal reflux disease)    Controlled on protonix.        Hypertension    Off blood pressure medication now.  Blood pressure 124/70 today.  Follow.        Hyponatremia    Has been worked up extensively.  Has been evaluated by nephrology.  Stable.  Follow.        Iron deficiency anemia    Followed by hematology.  Receiving iron infusions.        Mild depression ([place])    On zoloft.        Moderate COPD (chronic obstructive pulmonary disease) ([place])    Has a documented history of copd.  Has a f/u with pulmonary this week.  Plans to discuss his overnight oximetry.  Appears to need oxygen while sleeping.  Plans to discuss with pulmonary this week.  Continue trelegy.        Neuropathy    On gabapentin.       Pulmonary hypertension ([place])    Per pulmonary.  Breathing stable.        Renal lesion    Incidentally found on CT.  Radiology recommended MRI or CT for further evaluation.  Discuss with Dr Grayland Ormond.       Sleep apnea    Did not tolerate cpap previously.        Sleep difficulties    Discussed sleep issues with him today.  He  has his hours shifted.  Hold on sleeping medication.  Discussed the need to decrease his alcohol intake.  Discussed starting to shift hours back.        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  Stable.            Einar Pheasant, MD

## 2020-03-09 ENCOUNTER — Telehealth: Payer: Self-pay

## 2020-03-09 NOTE — Telephone Encounter (Signed)
Overnight study done on February 08, 2020 ending February 09, 2020 third really done on February 09, 2020 shows pulse ox less than or equal to 88% for 4 hours and 4 minutes.  The oxygen desaturation index was 18.37.  Plan - start 2L Nasal cannula at nuight    SIGNATURE    Dr. Brand Males, M.D., F.C.C.P,  Pulmonary and Critical Care Medicine Staff Physician, [place] Director - Interstitial Lung Disease  Program  Pulmonary [place] [facility] [place], [place], [address]  Pager: [phone], If no answer  OR between  19:00-7:00h: page [phone] Telephone (clinical office): [phone] Telephone (research): [phone]  5:52 PM 03/09/2020

## 2020-03-09 NOTE — Telephone Encounter (Signed)
ONO results have been hand delivered to MR.

## 2020-03-09 NOTE — Telephone Encounter (Signed)
Pt is scheduled for OV with MR on 03/10/2020. Upon checking charts, it appeared that pt was not notified with ONO results. I contact [employer] and requested that they fax over results.  ONO was preformed on 02/08/2020, therefore today makes the 30th day since test was performed.  Results have been faxed to [facility]. Raquel Sarna has been made aware via teams.   MR, please advise. Thanks

## 2020-03-10 ENCOUNTER — Other Ambulatory Visit: Payer: Self-pay

## 2020-03-10 ENCOUNTER — Ambulatory Visit: Payer: PPO | Admitting: Internal Medicine

## 2020-03-10 ENCOUNTER — Encounter: Payer: Self-pay | Admitting: Internal Medicine

## 2020-03-10 ENCOUNTER — Other Ambulatory Visit: Payer: Self-pay | Admitting: Pharmacy Technician

## 2020-03-10 VITALS — BP 126/70 | HR 73 | Temp 97.7°F | Ht 70.0 in | Wt 177.0 lb

## 2020-03-10 DIAGNOSIS — G4736 Sleep related hypoventilation in conditions classified elsewhere: Secondary | ICD-10-CM | POA: Diagnosis not present

## 2020-03-10 DIAGNOSIS — Z87891 Personal history of nicotine dependence: Secondary | ICD-10-CM

## 2020-03-10 DIAGNOSIS — J439 Emphysema, unspecified: Secondary | ICD-10-CM

## 2020-03-10 DIAGNOSIS — J984 Other disorders of lung: Secondary | ICD-10-CM

## 2020-03-10 DIAGNOSIS — J449 Chronic obstructive pulmonary disease, unspecified: Secondary | ICD-10-CM | POA: Diagnosis not present

## 2020-03-10 NOTE — Patient Outreach (Signed)
[place] [facility]) Care Management  03/10/2020  Troy Lopez 06/11/1941 [id number]  Two care coordination calls placed to Troy Lopez patient assistance and [facility] patient assistance.  Spoke to Troy Lopez at [employer] in regards to patient's application for Trelegy and Ventolin HFA. Troy Lopez informed patient was APPROVED for both medications from 03/08/2020-08/18/2020. She informed the shipment is in progress and should arrive to the patient's home in 7-10 days from the 21st.  Spoke to Troy Lopez at [employer] in regards to patient's application for Eliquis. Troy Lopez informed patient was APPROVED 03/09/2020-08/18/2020. She informed [facility] pharmacy would be outreaching patient to schedule delivery or patient could call them at [phone].  Will outreach patient with this information.  Troy Lopez, [place]  [phone]

## 2020-03-10 NOTE — Progress Notes (Signed)
OV 01/28/2020 -establishing pulmonary care in [place] with Dr. Chase Caller.  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  Prior to this visit he is not subjectively aware of the fact that he might have ILD although it is documented in his chart.  Subjective:  Patient ID: Troy Lopez, male , DOB: 20-May-1941 , age 79 y.o. , MRN: [id number] , ADDRESS: [place] [address]   01/28/2020 -   Chief Complaint  Patient presents with  . Follow-up    pt reports of sob with exertion, occ cough at times prod with clear mucus and occ wheezing.      HPI Troy Lopez 79 y.o. -he is here with his wife.  He believes he has COPD.  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  No subsequent PFTs.  Most recently he was asked to take Trelegy but because of expense he switch to [facility].  He tells me that for this visit that starting April he has had worsening shortness of breath compared to baseline.  He has been on prednisone taper x2.  He wanted low-dose prednisone on a daily basis for a month.  These seem to have helped but when he comes off the prednisone it is worse.  For the last 3 or 4 days has had worsening shortness of breath.  In fact when he walked only 1 out of her 3 laps he had to stop because of shortness of breath although he did not desaturate.  His resting pulse ox was 99% with a heart rate of 73/min.  His final pulse ox at 1 lap was 97/min which is only a drop of 2 points but he got tachycardic at 96/min and he stopped.  However he denies any change in his cough or sputum production or wheezing.  He does have some mild cough and white sputum production at baseline but there is no change.  Intermittent wheezing.  No orthopnea proximal nocturnal dyspnea edema.  He is on Eliquis.  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  Recently was given Lasix for elevated BNP  but this has not helped.  He had an echo [facility] and it is normal.  Most recent creatinine January 24, 2020 is 1 mg percent.  Sodium is 124 and low and this is chronically low in December it was 131.  He also appears chronically anemic with hemoglobin 8-9 g% which is lower than the year 2020 when he was between 10 and 11 g%.  Most recen recent test was in May 2021  [place] ILD Questionnaire  Symptoms: Gradual onset of shortness of breath getting worse in the last 3 years.  Episodic shortness of breath as well.  Has difficulty keeping up with others of his age.  Cough started 2 years ago.  It is the same since it started mostly mild but occasionally can be severe.  Has clear sputum he does clear his throat is a tickle in the back of the throat.   SYMPTOM SCALE - ILD 01/28/2020   O2 use ra  Shortness of Breath 0 -> 5 scale with 5 being worst (score 6 If unable to do)  At rest 5  Simple tasks - showers, clothes change, eating, shaving 5  Household (dishes, doing bed, laundry) 5  Shopping x  Walking  level at own pace 5  Walking up Stairs 5  Total (30-36) Dyspnea Score 25  How bad is your cough? 2  How bad is your fatigue yes  How bad is nausea 0  How bad is vomiting?  0  How bad is diarrhea? 0  How bad is anxiety? x  How bad is depression x        Past Medical History : *-As below   ROS:  -Several years of snoring morning headaches except daytime somnolence.  Some nausea.  Positive for acid reflux.  Positive for fatigue possible Raynaud's   FAMILY HISTORY of LUNG DISEASE:  -He did not fill the sheet   EXPOSURE HISTORY:  -No intravenous drug use no cocaine use.  Used marijuana only a couple of times.  No vaping   HOME and HOBBY DETAILS :  -Denies any kind of organic antigen exposure in his home.  [place] age of the home is 46 years and is lived there for 69 years but denies any mold or mildew or pet  hamsters of birds or [place] use of misting [place] use.  No feather pillows no mold in the [facility] duct no music habits no guarding habits no feather blankets or duvet.   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  Positive for working [employer] as a hobby positive for working in [employer] but otherwise negative.  PULMONARY TOXICITY HISTORY (27 items): Other than prednisone no other medicines for pulmonary or causing pulmonary toxicity    Testing     ROS - per HPI    OV 03/10/2020  Subjective:  Patient ID: Troy Lopez, male , DOB: 01-02-1941 , age 89 y.o. , MRN: [id number] , ADDRESS: [place] [id number]   03/10/2020 -   Chief Complaint  Patient presents with  . Follow-up    Shortness of breath at rest and with exertion     HPI Troy Lopez 79 y.o. -presents for follow-up to discuss his results. His PFT showed gold stage II COPD but there is a decline in the last 5 years. 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 It appears this calcifications are worse in the last 6 years. I personally visualized the film and agree that may be slightly worse. He is denying any trauma in the past or aspiration events or working in the [employer]. I spoke to Dr. Weber Cooks our radiologist again. We presented this in the case conference. Dr. Weber Cooks is puzzled by the calcifications. He is suggesting a biopsy if the patient would handle it. Patient and his wife are not fully sure. I offered the possibility of him going to [facility] for second opinion. He initially did not seem enthusiastic about it but later changed his mind. He did have overnight oxygen study was abnormal. However the 30-day point has elapsed. We apologize. He is going to have this retested. He is using Trelegy's right now and it seems to be okay for him.  Of note he  continues to have fatigue. He states that he fell down because his blood pressure was low because of medication effect. He bruised his right chest. However he does not want a chest x-ray or go to the emergency room. He says he'll just monitor it clinically at home. He continues to have low sodium which he says that he  has had since he was 79 years old. He also has chronic anemia from intermittent lower gastrointestinal bleeding not otherwise specified. He says he does not want his labs retested. He has a follow-up with a hematologist we'll check that.    SYMPTOM SCALE - ILD 01/28/2020  03/10/2020   O2 use ra ra  Shortness of Breath 0 -> 5 scale with 5 being worst (score 6 If unable to do)   At rest 5 1  Simple tasks - showers, clothes change, eating, shaving 5 1  Household (dishes, doing bed, laundry) 5 x  Shopping x 2  Walking level at own pace 5 2  Walking up Stairs 5 4.5  Total (30-36) Dyspnea Score 25 10.5  How bad is your cough? 2 1.5  How bad is your fatigue yes 2.5  How bad is nausea 0 0  How bad is vomiting?  0 0  How bad is diarrhea? 0 0  How bad is anxiety? x 1  How bad is depression x 1       IMPRESSION: CT CHEST 1. Severe centrilobular and paraseptal emphysema with diffuse bronchial wall thickening and saber sheath trachea, compatible with the provided history of COPD. 2. Irregular 0.6 cm solid posterior right lower lobe pulmonary nodule, not definitely seen on prior chest CT. Non-contrast chest CT at 6-12 months is recommended. If the nodule is stable at time of repeat CT, then future CT at 18-24 months (from today's scan) is considered optional for low-risk patients, but is recommended for high-risk patients. This recommendation follows the consensus statement: Guidelines for Management of Incidental Pulmonary Nodules Detected on CT Images: From the Fleischner Society 2017; Radiology 2017; 284:228-243. 3. Patchy coalescent regions of pulmonary parenchymal  calcification with associated mild fibrosis predominantly in the upper lobes, stable since 07/08/2019 chest CT, progressive since 05/20/2014 chest CT. 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 Suggest correlation with serum calcium. Per dr Weber Cooks - wrong distribution forsilicosis. Marland Kitchen Unsuual but non specicif findings per Dr Hurley Cisco. Biospy would be helpful 4. Dilated main pulmonary artery, suggesting pulmonary arterial hypertension. 5. Stable right adrenal adenoma. 6. Indeterminate 1.0 cm upper left renal cortical lesion, not appreciably changed since 07/08/2019 CT. Further evaluation with renal mass protocol MRI (preferred) or CT abdomen without and with IV contrast is indicated and may be performed as clinically warranted given patient comorbidities. 7. Aortic Atherosclerosis (ICD10-I70.0) and Emphysema (ICD10-J43.9).   Electronically Signed   By: Ilona Sorrel M.D.   On: 02/09/2020 14:29  ROS - per HPI  Results for COREY, LASKI (MRN [id number]) as of 03/10/2020 13:39  Ref. Range 09/22/2015 14:07 03/10/2020   FEV1-Post Latest Units: L 2.08 1.7L  FEV1-%Pred-Post Latest Units: % 63 58%     Results for SHLOIMA, CLINCH (MRN [id number]) as of 03/10/2020 13:39  Ref. Range 09/22/2015 14:07 03/10/2020   DLCO unc Latest Units: ml/min/mmHg 14.14 4.6  DLCO unc % pred Latest Units: % 41 18%   xxxxxxxxxxxxxxxxxxxxxxxxxxxxxxx   Results for DEYLAN, CANTERBURY (MRN [id number]) as of 03/10/2020 13:39  Ref. Range 01/28/2020 16:51  Anti Nuclear Antibody (ANA) Latest Ref Range: Negative  Negative  ANCA Proteinase 3 Latest Ref Range: 0.0 - 3.5 U/mL <3.5  Angiotensin-Converting Enzyme Latest Ref Range: 14 - 82 U/L 63  CCP Antibodies IgG/IgA Latest Ref Range: 0 - 19 units 1  ds DNA Ab Latest Ref Range: 0 - 9 IU/mL <1  Myeloperoxidase Abs Latest Ref Range: 0.0 - 9.0 U/mL <9.0  RA  Latex Turbid. Latest Ref Range: 0.0 - 13.9 IU/mL 11.0  Cytoplasmic (C-ANCA) Latest Ref  Range: Neg:<1:20 titer <1:20  P-ANCA Latest Ref Range: Neg:<1:20 titer <1:20  Atypical P-ANCA titer Latest Ref Range: Neg:<1:20 titer <1:20  SSA (Ro) (ENA) Antibody, IgG Latest Ref Range: 0.0 - 0.9 AI <0.2  SSB (La) (ENA) Antibody, IgG Latest Ref Range: 0.0 - 0.9 AI <0.2  Scleroderma (Scl-70) (ENA) Antibody, IgG Latest Ref Range: 0.0 - 0.9 AI <0.2    xxxxxxxxxxx  Results for DERICK, SEMINARA (MRN [id number]) as of 03/10/2020 13:39  Ref. Range 02/01/2020 10:24  Sodium Latest Ref Range: 135 - 145 mEq/L 124 (L)   Results for KAZ, AULD (MRN [id number]) as of 03/10/2020 13:39  Ref. Range 02/01/2020 10:24  Creatinine Latest Ref Range: 0.40 - 1.50 mg/dL 1.12  Results for QUAVON, KEISLING (MRN [id number]) as of 03/10/2020 13:39  Ref. Range 02/14/2020 13:58  Hemoglobin Latest Ref Range: 13.0 - 17.0 g/dL 7.6 (L)  Results for GUSTAVUS, HASKIN (MRN [id number]) as of 03/10/2020 13:39  Ref. Range 02/01/2020 10:24  Calcium Latest Ref Range: 8.4 - 10.5 mg/dL 9.6    has a past medical history of Adrenal gland anomaly, Anxiety, CAD (coronary artery disease), Carpal tunnel syndrome, Chronic hyponatremia, Colitis, Colonic polyp, COPD (chronic obstructive pulmonary disease) ([place]), Degenerative disc disease, lumbar, Depression, Diverticulosis, Dyspnea, GERD (gastroesophageal reflux disease), H/O degenerative disc disease, Hypercholesterolemia, Hyperkalemia, Hyperlipidemia, Hypertension, Irritable bowel syndrome, Monoclonal gammopathy, Monoclonal gammopathy, Neuropathy, Personal history of tobacco use, presenting hazards to health (08/17/2015), Sleep apnea, and Vertebral compression fracture ([place]).   reports that he quit smoking about 4 years ago. His smoking use included cigarettes. He started smoking about 62 years ago. He has a 116.00 pack-year smoking history. He has never used smokeless tobacco.  Past Surgical History:  Procedure Laterality Date  . BUNIONECTOMY  1989  . CARPAL TUNNEL RELEASE Left 2011    ulnar nerve sub muscular at elbow  . CHOLECYSTECTOMY  09/07  . COLONOSCOPY WITH PROPOFOL N/A 03/05/2017   Procedure: COLONOSCOPY WITH PROPOFOL;  Surgeon: Lucilla Lame, MD;  Location: [facility] ENDOSCOPY;  Service: Endoscopy;  Laterality: N/A;  . ELECTROPHYSIOLOGIC STUDY N/A 11/15/2015   Procedure: CARDIOVERSION;  Surgeon: Isaias Cowman, MD;  Location: [facility] ORS;  Service: Cardiovascular;  Laterality: N/A;  . ESOPHAGOGASTRODUODENOSCOPY (EGD) WITH PROPOFOL N/A 03/05/2017   Procedure: ESOPHAGOGASTRODUODENOSCOPY (EGD) WITH PROPOFOL;  Surgeon: Lucilla Lame, MD;  Location: [facility] ENDOSCOPY;  Service: Endoscopy;  Laterality: N/A;  . ESOPHAGOGASTRODUODENOSCOPY (EGD) WITH PROPOFOL N/A 10/17/2017   Procedure: ESOPHAGOGASTRODUODENOSCOPY (EGD) WITH PROPOFOL;  Surgeon: Lollie Sails, MD;  Location: [facility] ENDOSCOPY;  Service: Endoscopy;  Laterality: N/A;  . EYE SURGERY Bilateral 2010   cataract  . HEMORRHOID SURGERY    . KYPHOPLASTY N/A 11/04/2016   Procedure: KYPHOPLASTY T 12;  Surgeon: Hessie Knows, MD;  Location: [facility] ORS;  Service: Orthopedics;  Laterality: N/A;    Allergies  Allergen Reactions  . 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 Influenza Split 05/22/2009, 07/17/2011, 06/02/2014  . Influenza, High Dose Seasonal PF 04/19/2016, 04/30/2017, 07/07/2018, 07/14/2019  . Influenza,inj,Quad PF,6+ Mos 05/26/2015  . PFIZER SARS-COV-2 Vaccination 08/24/2019, 09/14/2019  . Pneumococcal Conjugate-13 08/22/2017  . Pneumococcal Polysaccharide-23 06/18/2004, 09/09/2012  . Tdap 11/03/2018    Family History  Problem Relation Age of Onset  .  Stroke Mother   . Heart disease Father        MI - 22   . Colon cancer Neg Hx   . Prostate cancer Neg Hx      Current Outpatient  Medications:  .  acetaminophen (TYLENOL) 500 MG tablet, Take 500-1,000 mg by mouth 3 (three) times daily as needed for moderate pain or headache., Disp: , Rfl:  .  albuterol (VENTOLIN HFA) 108 (90 Base) MCG/ACT inhaler, Inhale 2 puffs into the lungs every 6 (six) hours as needed for wheezing or shortness of breath., Disp: 18 g, Rfl: 11 .  azelastine (ASTELIN) 0.1 % nasal spray, Place 1 spray into both nostrils 2 (two) times daily. Use in each nostril as directed, Disp: 30 mL, Rfl: 1 .  budesonide (ENTOCORT EC) 3 MG 24 hr capsule, Take 9 mg by mouth daily as needed (colitis flare). , Disp: , Rfl:  .  calcium-vitamin D (OSCAL WITH D) 500-200 MG-UNIT tablet, Take 2 tablets by mouth daily., Disp: , Rfl:  .  doxycycline (VIBRA-TABS) 100 MG tablet, Take 1 tablet (100 mg total) by mouth 2 (two) times daily., Disp: 10 tablet, Rfl: 0 .  ELIQUIS 5 MG TABS tablet, Take 5 mg by mouth 2 (two) times daily. , Disp: , Rfl:  .  FIBER PO, Take 1 capsule by mouth daily. , Disp: , Rfl:  .  fluticasone (FLONASE) 50 MCG/ACT nasal spray, Place 1 spray into both nostrils daily., Disp: 16 g, Rfl: 2 .  Fluticasone-Umeclidin-Vilant (TRELEGY ELLIPTA) 100-62.5-25 MCG/INH AEPB, Inhale 1 puff into the lungs daily., Disp: 1 each, Rfl: 11 .  furosemide (LASIX) 20 MG tablet, Take 1 tablet (20 mg total) by mouth daily as needed. (Patient taking differently: 40 mg. ), Disp: 30 tablet, Rfl: 0 .  gabapentin (NEURONTIN) 100 MG capsule, Take 4 capsules (400 mg) daily in the morning and midday. Take 8 capsules (800 mg) daily in the evening., Disp: 480 capsule, Rfl: 0 .  metoprolol tartrate (LOPRESSOR) 25 MG tablet, Take 25 mg by mouth 2 (two) times daily., Disp: , Rfl:  .  pantoprazole (PROTONIX) 40 MG tablet, Take 1 tablet (40 mg total) by mouth 2 (two) times daily., Disp: 180 tablet, Rfl: 0 .  sertraline (ZOLOFT) 50 MG tablet, Take 1 tablet (50 mg total) by mouth daily., Disp: 30 tablet, Rfl: 0 .  triamcinolone ointment (KENALOG) 0.1 %, ,  Disp: , Rfl:  .  vitamin B-12 (CYANOCOBALAMIN) 1000 MCG tablet, Take 1,000 mcg by mouth daily. , Disp: , Rfl:  .  isosorbide mononitrate (IMDUR) 30 MG 24 hr tablet, Take 1 tablet (30 mg total) by mouth once daily. (Patient not taking: Reported on 03/10/2020), Disp: , Rfl:       Objective:   Vitals:   03/10/20 1332  BP: 126/70  Pulse: 73  Temp: 97.7 F (36.5 C)  TempSrc: Temporal  SpO2: 96%  Weight: 177 lb (80.3 kg)  Height: 5\' 10"  (1.778 m)    Estimated body mass index is 25.4 kg/m as calculated from the following:   Height as of this encounter: 5\' 10"  (1.778 m).   Weight as of this encounter: 177 lb (80.3 kg).  @WEIGHTCHANGE @  [employer]   03/10/20 1332  Weight: 177 lb (80.3 kg)     Physical Exam   General Appearance:    Alert, cooperative, no distress, appears stated age - yes , Deconditioned looking - yes , OBESE  - no, Sitting on Wheelchair -  yes  Head:  Normocephalic, without obvious abnormality, atraumatic  Eyes:    PERRL, conjunctiva/corneas clear,  Ears:    Normal TM's and external ear canals, both ears  Nose:   Nares normal, septum midline, mucosa normal, no drainage    or sinus tenderness. OXYGEN ON  - no . Patient is @ ra   Throat:   Lips, mucosa, and tongue normal; teeth and gums normal. Cyanosis on lips - no  Neck:   Supple, symmetrical, trachea midline, no adenopathy;    thyroid:  no enlargement/tenderness/nodules; no carotid   bruit or JVD  Back:     Symmetric, no curvature, ROM normal, no CVA tenderness  Lungs:     Distress - no , Wheeze no, Barrell Chest - tyes, Purse lip breathing - no, Crackles - no ,. Guards right chest infra-axillary aread but  Chest Wall:    No tenderness or deformity.    Heart:    Regular rate and rhythm, S1 and S2 normal, no rub   or gallop, Murmur - no  Breast Exam:    NOT DONE  Abdomen:     Soft, non-tender, bowel sounds active all four quadrants,    no masses, no organomegaly. Visceral obesity - no  Genitalia:   NOT  DONE  Rectal:   NOT DONE  Extremities:   Extremities - normal, Has Cane - no, Clubbing - no, Edema - no  Pulses:   2+ and symmetric all extremities  Skin:   Stigmata of Connective Tissue Disease - no  Lymph nodes:   Cervical, supraclavicular, and axillary nodes normal  Psychiatric:  Neurologic:   Pleasant - yes, Anxious - no, Flat affect - no  CAm-ICU - neg, Alert and Oriented x 3 - yes, Moves all 4s - yes, Speech - normal, Cognition - intact           Assessment:       ICD-10-CM   1. Moderate COPD (chronic obstructive pulmonary disease) (HCC)  J44.9 Alpha-1 antitrypsin phenotype    Pulse oximetry, overnight    Ambulatory referral to Pulmonology  2. Stopped smoking with greater than 40 pack year history  Z87.891   3. Nocturnal hypoxemia due to emphysema (HCC)  J43.9    G47.36   4. Pulmonary calcification determined by X-ray  J98.4        Plan:     Patient Instructions     ICD-10-CM   1. Moderate COPD (chronic obstructive pulmonary disease) (HCC)  J44.9 Alpha-1 antitrypsin phenotype    Pulse oximetry, overnight  2. Stopped smoking with greater than 40 pack year history  Z87.891   3. Nocturnal hypoxemia due to emphysema (HCC)  J43.9    G47.36   4. Pulmonary calcification determined by X-ray  J98.4     You have gold stage 2 copd and unxeplained calcification in the middle of lung that have been there since 2015 (your first CT chest)  Plan - continue trelegy scheduled+ albuterl as needed  - you need night o2 but we need to retest - if chest pain from fall gets worse go to ER  - anemia and low sodium per hematologist  - will keep eye on calcification (per shared discussion - we are holding off on biopsy)  = repeat CT chest in 9-12 months - can order at followup - check alpha 1 AT phenotype  = ok to see  [facility] for 2nd opinion  Followup - 12 weeks or sooner with me or an APP - COPD CAT  score at followup - if doing well can stretch  followup       SIGNATURE    Dr. Brand Males, M.D., F.C.C.P,  Pulmonary and Critical Care Medicine Staff Physician, [place] Director - Interstitial Lung Disease  Program  Pulmonary [place] [facility] [place], [place], [address]  Pager: [phone], If no answer or between  15:00h - 7:00h: call [phone] Telephone: [phone]  2:14 PM 03/10/2020

## 2020-03-10 NOTE — Patient Instructions (Addendum)
ICD-10-CM   1. Moderate COPD (chronic obstructive pulmonary disease) (HCC)  J44.9 Alpha-1 antitrypsin phenotype    Pulse oximetry, overnight  2. Stopped smoking with greater than 40 pack year history  Z87.891   3. Nocturnal hypoxemia due to emphysema (HCC)  J43.9    G47.36   4. Pulmonary calcification determined by X-ray  J98.4     You have gold stage 2 copd and unxeplained calcification in the middle of lung that have been there since 2015 (your first CT chest)  Plan - continue trelegy scheduled+ albuterl as needed  - you need night o2 but we need to retest - if chest pain from fall gets worse go to ER  - anemia and low sodium per hematologist  - will keep eye on calcification (per shared discussion - we are holding off on biopsy)  = repeat CT chest in 9-12 months - can order at followup - check alpha 1 AT phenotype  = ok to see  [place] [facility] for 2nd opinion  Followup - 12 weeks or sooner with me or an APP - COPD CAT score at followup - if doing well can stretch followup

## 2020-03-10 NOTE — Patient Outreach (Signed)
[facility]) Care Management  03/10/2020  Tyjai Matuszak [place] 04/01/1941 [id number]  ADDENDUM  Successfull outreach call placed to patient in regards to [employer] application for Eliquis and [place] application for Trelegy and Ventolin HFA.  Spoke to patient, HIPAA identifiers verified.  Informed patient he was approved for both programs. Informed him the inhalers would be arriving in 7-10 days. Provided patient phone number to [employer] to set up his Eliquis delivery. Patient verbalized understanding.  Will follow up with patient in 7-10 business days to confirm he received the medications and to discuss refill procedures.  Marabella Popiel P. Okechukwu Regnier, [place]  [phone]

## 2020-03-10 NOTE — Telephone Encounter (Signed)
This matter was discussed with pt during today's visit.

## 2020-03-10 NOTE — Telephone Encounter (Signed)
unfortunately the test began on the 22nd and that is what insurance goes by. Yesterday was the 30th day, therefore test will need to be repeated.  Pt is scheduled for OV today. Can you discuss this further with pt during his OV?

## 2020-03-12 ENCOUNTER — Encounter: Payer: Self-pay | Admitting: Internal Medicine

## 2020-03-12 DIAGNOSIS — N289 Disorder of kidney and ureter, unspecified: Secondary | ICD-10-CM | POA: Insufficient documentation

## 2020-03-12 NOTE — Assessment & Plan Note (Signed)
Discussed sleep issues with him today.  He has his hours shifted.  Hold on sleeping medication.  Discussed the need to decrease his alcohol intake.  Discussed starting to shift hours back.

## 2020-03-12 NOTE — Assessment & Plan Note (Signed)
On gabapentin.  

## 2020-03-12 NOTE — Assessment & Plan Note (Signed)
Has been worked up extensively.  Has been evaluated by nephrology.  Stable.  Follow.

## 2020-03-12 NOTE — Assessment & Plan Note (Signed)
S/p cardioversion.  On eliquis.  Stable.  Continue f/u with cardiology.  

## 2020-03-12 NOTE — Assessment & Plan Note (Signed)
Per pulmonary.  Breathing stable.

## 2020-03-12 NOTE — Assessment & Plan Note (Signed)
Did not tolerate cpap previously.

## 2020-03-12 NOTE — Assessment & Plan Note (Signed)
Controlled on protonix.   

## 2020-03-12 NOTE — Assessment & Plan Note (Signed)
Off blood pressure medication now.  Blood pressure 124/70 today.  Follow.

## 2020-03-12 NOTE — Assessment & Plan Note (Signed)
Followed by hematology.  Receiving iron infusions.   

## 2020-03-12 NOTE — Assessment & Plan Note (Signed)
Has a documented history of copd.  Has a f/u with pulmonary this week.  Plans to discuss his overnight oximetry.  Appears to need oxygen while sleeping.  Plans to discuss with pulmonary this week.  Continue trelegy.

## 2020-03-12 NOTE — Assessment & Plan Note (Signed)
On zoloft

## 2020-03-12 NOTE — Assessment & Plan Note (Signed)
Followed by cardiology.  Continue risk factor modification.   

## 2020-03-12 NOTE — Assessment & Plan Note (Signed)
Followed by hematology. Stable.  

## 2020-03-12 NOTE — Assessment & Plan Note (Signed)
Incidentally found on CT.  Radiology recommended MRI or CT for further evaluation.  Discuss with Dr Grayland Ormond.

## 2020-03-13 ENCOUNTER — Telehealth: Payer: Self-pay | Admitting: Internal Medicine

## 2020-03-13 NOTE — Telephone Encounter (Signed)
Please notify pt that I spoke to Dr Grayland Ormond and he will review his scan and discuss further renal evaluation at his upcoming appt.

## 2020-03-13 NOTE — Progress Notes (Signed)
Troy Lopez  [phone]) [phone] Fax:(336) [phone]  ID: Duane Lope Mcclure OB: May 08, 1941  MR#: [id number]  KDT#:[id number]  Patient Care Team: Einar Pheasant, MD as PCP - General (Internal Medicine) Lollie Sails, MD (Inactive) as Consulting Physician (Gastroenterology) Ok Edwards, NP as Nurse Practitioner (Gastroenterology) Lloyd Huger, MD as Consulting Physician (Oncology)  CHIEF COMPLAINT: [name]'s macroglobulinemia, iron deficiency anemia.  INTERVAL HISTORY: Patient returns to clinic today for repeat laboratory work, further evaluation, and consideration of blood transfusion.  He continues to have increasing weakness and fatigue despite receiving an iron infusion approximately 4 weeks ago.  He otherwise feels well.  He has no neurologic complaints.  He denies any recent fevers or illnesses. He has a good appetite and denies weight loss.  He denies any chest pain, shortness of breath, cough, or hemoptysis.  He denies any nausea, vomiting, constipation, or diarrhea.  He denies any melena or hematochezia.  He has no urinary complaints.  Patient feels generally terrible, but offers no further specific complaints today.  REVIEW OF SYSTEMS:   Review of Systems  Constitutional: Positive for malaise/fatigue. Negative for fever and weight loss.  Respiratory: Negative.  Negative for cough, hemoptysis and shortness of breath.   Cardiovascular: Negative.  Negative for chest pain and leg swelling.  Gastrointestinal: Negative.  Negative for abdominal pain, blood in stool, diarrhea and melena.  Genitourinary: Negative.  Negative for hematuria.  Musculoskeletal: Negative.  Negative for back pain.  Skin: Negative.  Negative for rash.  Neurological: Positive for weakness. Negative for dizziness, sensory change, focal weakness and headaches.  Psychiatric/Behavioral: The patient has insomnia. The patient is not nervous/anxious.     As per HPI.  Otherwise, a complete review of systems is negative.  PAST MEDICAL HISTORY: Past Medical History:  Diagnosis Date   Adrenal gland anomaly    enlargement   Anxiety    CAD (coronary artery disease)    Carpal tunnel syndrome    Chronic hyponatremia    Colitis    Colonic polyp    COPD (chronic obstructive pulmonary disease) (HCC)    Degenerative disc disease, lumbar    Depression    Diverticulosis    Dyspnea    GERD (gastroesophageal reflux disease)    H/O degenerative disc disease    Hypercholesterolemia    Hyperkalemia    Hyperlipidemia    Hypertension    Irritable bowel syndrome    Monoclonal gammopathy    Monoclonal gammopathy    Neuropathy    Personal history of tobacco use, presenting hazards to health 08/17/2015   Sleep apnea    Vertebral compression fracture ([place])     PAST SURGICAL HISTORY: Past Surgical History:  Procedure Laterality Date   BUNIONECTOMY  1989   CARPAL TUNNEL RELEASE Left 2011   ulnar nerve sub muscular at elbow   CHOLECYSTECTOMY  09/07   COLONOSCOPY WITH PROPOFOL N/A 03/05/2017   Procedure: COLONOSCOPY WITH PROPOFOL;  Surgeon: Lucilla Lame, MD;  Location: [facility] ENDOSCOPY;  Service: Endoscopy;  Laterality: N/A;   ELECTROPHYSIOLOGIC STUDY N/A 11/15/2015   Procedure: CARDIOVERSION;  Surgeon: Isaias Cowman, MD;  Location: [facility] ORS;  Service: Cardiovascular;  Laterality: N/A;   ESOPHAGOGASTRODUODENOSCOPY (EGD) WITH PROPOFOL N/A 03/05/2017   Procedure: ESOPHAGOGASTRODUODENOSCOPY (EGD) WITH PROPOFOL;  Surgeon: Lucilla Lame, MD;  Location: [facility] ENDOSCOPY;  Service: Endoscopy;  Laterality: N/A;   ESOPHAGOGASTRODUODENOSCOPY (EGD) WITH PROPOFOL N/A 10/17/2017   Procedure: ESOPHAGOGASTRODUODENOSCOPY (EGD) WITH PROPOFOL;  Surgeon: Lollie Sails, MD;  Location: [facility] ENDOSCOPY;  Service: Endoscopy;  Laterality: N/A;   EYE SURGERY Bilateral 2010   cataract   HEMORRHOID SURGERY     KYPHOPLASTY N/A 11/04/2016   Procedure:  KYPHOPLASTY T 12;  Surgeon: Hessie Knows, MD;  Location: [facility] ORS;  Service: Orthopedics;  Laterality: N/A;    FAMILY HISTORY Family History  Problem Relation Age of Onset   Stroke Mother    Heart disease Father        MI - 43    Colon cancer Neg Hx    Prostate cancer Neg Hx        ADVANCED DIRECTIVES:    HEALTH MAINTENANCE: Social History   Tobacco Use   Smoking status: Former Smoker    Packs/day: 2.00    Years: 58.00    Pack years: 116.00    Types: Cigarettes    Start date: 08/19/1957    Quit date: 11/01/2015    Years since quitting: 4.3   Smokeless tobacco: Never Used  Vaping Use   Vaping Use: Never used  Substance Use Topics   Alcohol use: Yes    Alcohol/week: 42.0 standard drinks    Types: 42 Cans of beer per week    Comment: occas   Drug use: No     Colonoscopy:  PAP:  Bone density:  Lipid panel:  Allergies  Allergen Reactions   Iodinated Diagnostic Agents Other (See Comments)    Contraindication secondary to IGMgammopathy/Waldren's syndrome   Not to be given due to Waldenstrom's syndrome, told it could affect kidney function    Current Outpatient Medications  Medication Sig Dispense Refill   acetaminophen (TYLENOL) 500 MG tablet Take 500-1,000 mg by mouth 3 (three) times daily as needed for moderate pain or headache.     albuterol (VENTOLIN HFA) 108 (90 Base) MCG/ACT inhaler Inhale 2 puffs into the lungs every 6 (six) hours as needed for wheezing or shortness of breath. 18 g 11   ELIQUIS 5 MG TABS tablet Take 5 mg by mouth 2 (two) times daily.      FIBER PO Take 1 capsule by mouth daily.      fluticasone (FLONASE) 50 MCG/ACT nasal spray Place 1 spray into both nostrils daily. 16 g 2   Fluticasone-Umeclidin-Vilant (TRELEGY ELLIPTA) 100-62.5-25 MCG/INH AEPB Inhale 1 puff into the lungs daily. 1 each 11   furosemide (LASIX) 20 MG tablet Take 1 tablet (20 mg total) by mouth daily as needed. (Patient taking differently: 40 mg. ) 30 tablet  0   gabapentin (NEURONTIN) 100 MG capsule Take 4 capsules (400 mg) daily in the morning and midday. Take 8 capsules (800 mg) daily in the evening. 480 capsule 0   metoprolol tartrate (LOPRESSOR) 25 MG tablet Take 25 mg by mouth daily.      pantoprazole (PROTONIX) 40 MG tablet Take 1 tablet (40 mg total) by mouth 2 (two) times daily. 180 tablet 0   sertraline (ZOLOFT) 50 MG tablet Take 1 tablet (50 mg total) by mouth daily. 30 tablet 0   triamcinolone ointment (KENALOG) 0.1 %      vitamin B-12 (CYANOCOBALAMIN) 1000 MCG tablet Take 1,000 mcg by mouth daily.      azelastine (ASTELIN) 0.1 % nasal spray Place 1 spray into both nostrils 2 (two) times daily. Use in each nostril as directed 30 mL 1   budesonide (ENTOCORT EC) 3 MG 24 hr capsule Take 9 mg by mouth daily as needed (colitis flare).      calcium-vitamin D (OSCAL WITH D) 500-200 MG-UNIT tablet Take 2 tablets by  mouth daily.     doxycycline (VIBRA-TABS) 100 MG tablet Take 1 tablet (100 mg total) by mouth 2 (two) times daily. 10 tablet 0   isosorbide mononitrate (IMDUR) 30 MG 24 hr tablet Take 1 tablet (30 mg total) by mouth once daily. (Patient not taking: Reported on 03/10/2020)     No current facility-administered medications for this visit.    OBJECTIVE: Vitals:   03/15/20 0846  BP: (!) 121/48  Pulse: 71  Temp: (!) 97.2 F (36.2 C)  SpO2: 99%     Body mass index is 26.03 kg/m.    ECOG FS:1 - Symptomatic but completely ambulatory  General: Well-developed, well-nourished, no acute distress.  Sitting in a wheelchair. Eyes: Pink conjunctiva, anicteric sclera. HEENT: Normocephalic, moist mucous membranes. Lungs: No audible wheezing or coughing. Heart: Regular rate and rhythm. Abdomen: Soft, nontender, no obvious distention. Musculoskeletal: No edema, cyanosis, or clubbing. Neuro: Alert, answering all questions appropriately. Cranial nerves grossly intact. Skin: No rashes or petechiae noted. Psych: Normal affect.   LAB  RESULTS:  Lab Results  Component Value Date   NA 124 (L) 02/01/2020   K 4.5 02/01/2020   CL 93 (L) 02/01/2020   CO2 23 02/01/2020   GLUCOSE 143 (H) 02/01/2020   BUN 18 02/01/2020   CREATININE 1.12 02/01/2020   CALCIUM 9.6 02/01/2020   PROT 7.7 02/01/2020   ALBUMIN 3.7 02/01/2020   AST 13 02/01/2020   ALT 8 02/01/2020   ALKPHOS 48 02/01/2020   BILITOT 0.3 02/01/2020   GFRNONAA >60 01/06/2020   GFRAA >60 01/06/2020    Lab Results  Component Value Date   WBC 7.2 03/15/2020   NEUTROABS 5.3 03/15/2020   HGB 6.9 (L) 03/15/2020   HCT 21.8 (L) 03/15/2020   MCV 98.2 03/15/2020   PLT 351 03/15/2020   Lab Results  Component Value Date   IRON 28 (L) 03/15/2020   TIBC 864 (H) 03/15/2020   IRONPCTSAT 3 (L) 03/15/2020    Lab Results  Component Value Date   FERRITIN 41 03/15/2020   Lab Results  Component Value Date   TOTALPROTELP 8.2 10/19/2019   ALBUMINELP 3.4 10/19/2019   A1GS 0.3 10/19/2019   A2GS 0.6 10/19/2019   BETS 1.6 (H) 10/19/2019   BETA2SER 0.2 02/14/2016   GAMS 2.3 (H) 10/19/2019   MSPIKE Not Observed 10/19/2019   SPEI Comment 10/19/2019    STUDIES: No results found.  ASSESSMENT: [name]'s macroglobulinemia, iron deficiency anemia.  PLAN:    1. [name]'s macroglobulinemia:  Bone marrow biopsy results from Jan 08, 2017 reviewed independently confirming underlying [name]'s.  Patient's M spike has been erratic ranging from 1.1 in May 2018 to as high as 2.6 in October 2018.  His most recent results on October 19, 2019 did not reveal an M spike, but continued to have a persistently elevated IgM level of 3755.  Kappa free light chains are essentially unchanged at 75.3. He has persistent iron deficiency anemia from a GI source, but no other evidence of endorgan damage. He does not require any treatment at this time. If he had progression of disease as evidenced by B-symptoms or endorgan damage, will consider Rituxan-based therapy.  Continue to monitor  every 3 to 6 months. 2. 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  Despite iron infusions and recent blood transfusion, patient's hemoglobin has decreased to 6.9 and he is  symptomatic.  Proceed with 2 unit of packed red blood cells today.  A referral was made back to GI for consideration of repeat EGD and colonoscopy.  Return to clinic in 1 week with repeat laboratory work, further evaluation, and consideration of blood or Feraheme.   3.  Back pain: Chronic and unchanged.  Continue monitoring and treatment per pain clinic. 4.  Insomnia: Monitor.  I spent a total of 30 minutes reviewing chart data, face-to-face evaluation with the patient, counseling and coordination of care as detailed above.   Patient expressed understanding and was in agreement with this plan. He also understands that He can call clinic at any time with any questions, concerns, or complaints.    Lloyd Huger, MD   03/16/2020 6:54 AM

## 2020-03-14 ENCOUNTER — Ambulatory Visit: Payer: PPO | Admitting: Oncology

## 2020-03-14 ENCOUNTER — Ambulatory Visit: Payer: PPO

## 2020-03-14 ENCOUNTER — Other Ambulatory Visit: Payer: PPO

## 2020-03-14 ENCOUNTER — Encounter: Payer: Self-pay | Admitting: Oncology

## 2020-03-14 NOTE — Progress Notes (Signed)
Patient called for pre assessment. He states that his bp dropped last week and ended up passing out and breaking some ribs. He reports he has stopped taking one of his bp medication and cut back on his metoprolol. He denies other concerns at this time.

## 2020-03-15 ENCOUNTER — Encounter: Payer: Self-pay | Admitting: Oncology

## 2020-03-15 ENCOUNTER — Other Ambulatory Visit: Payer: Self-pay

## 2020-03-15 ENCOUNTER — Inpatient Hospital Stay: Payer: PPO

## 2020-03-15 ENCOUNTER — Inpatient Hospital Stay: Payer: PPO | Attending: Nurse Practitioner | Admitting: Oncology

## 2020-03-15 VITALS — BP 121/48 | HR 71 | Temp 97.2°F | Wt 181.4 lb

## 2020-03-15 DIAGNOSIS — M549 Dorsalgia, unspecified: Secondary | ICD-10-CM | POA: Diagnosis not present

## 2020-03-15 DIAGNOSIS — C880 Waldenstrom macroglobulinemia: Secondary | ICD-10-CM | POA: Diagnosis not present

## 2020-03-15 DIAGNOSIS — K219 Gastro-esophageal reflux disease without esophagitis: Secondary | ICD-10-CM | POA: Insufficient documentation

## 2020-03-15 DIAGNOSIS — Z79899 Other long term (current) drug therapy: Secondary | ICD-10-CM | POA: Diagnosis not present

## 2020-03-15 DIAGNOSIS — R531 Weakness: Secondary | ICD-10-CM | POA: Insufficient documentation

## 2020-03-15 DIAGNOSIS — I2510 Atherosclerotic heart disease of native coronary artery without angina pectoris: Secondary | ICD-10-CM | POA: Diagnosis not present

## 2020-03-15 DIAGNOSIS — E7800 Pure hypercholesterolemia, unspecified: Secondary | ICD-10-CM | POA: Diagnosis not present

## 2020-03-15 DIAGNOSIS — Z86010 Personal history of colonic polyps: Secondary | ICD-10-CM | POA: Diagnosis not present

## 2020-03-15 DIAGNOSIS — Z87891 Personal history of nicotine dependence: Secondary | ICD-10-CM | POA: Insufficient documentation

## 2020-03-15 DIAGNOSIS — J449 Chronic obstructive pulmonary disease, unspecified: Secondary | ICD-10-CM | POA: Diagnosis not present

## 2020-03-15 DIAGNOSIS — G629 Polyneuropathy, unspecified: Secondary | ICD-10-CM | POA: Insufficient documentation

## 2020-03-15 DIAGNOSIS — D509 Iron deficiency anemia, unspecified: Secondary | ICD-10-CM

## 2020-03-15 DIAGNOSIS — R5383 Other fatigue: Secondary | ICD-10-CM | POA: Insufficient documentation

## 2020-03-15 DIAGNOSIS — Z7952 Long term (current) use of systemic steroids: Secondary | ICD-10-CM | POA: Insufficient documentation

## 2020-03-15 DIAGNOSIS — Z7901 Long term (current) use of anticoagulants: Secondary | ICD-10-CM | POA: Diagnosis not present

## 2020-03-15 DIAGNOSIS — F418 Other specified anxiety disorders: Secondary | ICD-10-CM | POA: Diagnosis not present

## 2020-03-15 DIAGNOSIS — I10 Essential (primary) hypertension: Secondary | ICD-10-CM | POA: Diagnosis not present

## 2020-03-15 DIAGNOSIS — E785 Hyperlipidemia, unspecified: Secondary | ICD-10-CM | POA: Insufficient documentation

## 2020-03-15 DIAGNOSIS — G4700 Insomnia, unspecified: Secondary | ICD-10-CM | POA: Insufficient documentation

## 2020-03-15 DIAGNOSIS — G4730 Sleep apnea, unspecified: Secondary | ICD-10-CM | POA: Diagnosis not present

## 2020-03-15 DIAGNOSIS — M5136 Other intervertebral disc degeneration, lumbar region: Secondary | ICD-10-CM | POA: Diagnosis not present

## 2020-03-15 LAB — CBC WITH DIFFERENTIAL/PLATELET
Abs Immature Granulocytes: 0.04 10*3/uL (ref 0.00–0.07)
Basophils Absolute: 0 10*3/uL (ref 0.0–0.1)
Basophils Relative: 0 %
Eosinophils Absolute: 0 10*3/uL (ref 0.0–0.5)
Eosinophils Relative: 1 %
HCT: 21.8 % — ABNORMAL LOW (ref 39.0–52.0)
Hemoglobin: 6.9 g/dL — ABNORMAL LOW (ref 13.0–17.0)
Immature Granulocytes: 1 %
Lymphocytes Relative: 17 %
Lymphs Abs: 1.2 10*3/uL (ref 0.7–4.0)
MCH: 31.1 pg (ref 26.0–34.0)
MCHC: 31.7 g/dL (ref 30.0–36.0)
MCV: 98.2 fL (ref 80.0–100.0)
Monocytes Absolute: 0.5 10*3/uL (ref 0.1–1.0)
Monocytes Relative: 7 %
Neutro Abs: 5.3 10*3/uL (ref 1.7–7.7)
Neutrophils Relative %: 74 %
Platelets: 351 10*3/uL (ref 150–400)
RBC: 2.22 MIL/uL — ABNORMAL LOW (ref 4.22–5.81)
RDW: 18.6 % — ABNORMAL HIGH (ref 11.5–15.5)
WBC: 7.2 10*3/uL (ref 4.0–10.5)
nRBC: 0 % (ref 0.0–0.2)

## 2020-03-15 LAB — IRON AND TIBC
Iron: 28 ug/dL — ABNORMAL LOW (ref 45–182)
Saturation Ratios: 3 % — ABNORMAL LOW (ref 17.9–39.5)
TIBC: 864 ug/dL — ABNORMAL HIGH (ref 250–450)
UIBC: 836 ug/dL

## 2020-03-15 LAB — FERRITIN: Ferritin: 41 ng/mL (ref 24–336)

## 2020-03-15 LAB — PREPARE RBC (CROSSMATCH)

## 2020-03-15 LAB — SAMPLE TO BLOOD BANK

## 2020-03-15 MED ORDER — SODIUM CHLORIDE 0.9% IV SOLUTION
250.0000 mL | Freq: Once | INTRAVENOUS | Status: AC
  Administered 2020-03-15: 250 mL via INTRAVENOUS
  Filled 2020-03-15: qty 250

## 2020-03-15 MED ORDER — ACETAMINOPHEN 325 MG PO TABS
650.0000 mg | ORAL_TABLET | Freq: Once | ORAL | Status: AC
  Administered 2020-03-15: 650 mg via ORAL

## 2020-03-15 MED ORDER — DIPHENHYDRAMINE HCL 50 MG/ML IJ SOLN
INTRAMUSCULAR | Status: AC
  Filled 2020-03-15: qty 1

## 2020-03-15 MED ORDER — DIPHENHYDRAMINE HCL 50 MG/ML IJ SOLN
25.0000 mg | Freq: Once | INTRAMUSCULAR | Status: AC
  Administered 2020-03-15: 25 mg via INTRAVENOUS

## 2020-03-15 MED ORDER — SODIUM CHLORIDE 0.9% FLUSH
3.0000 mL | INTRAVENOUS | Status: DC | PRN
  Filled 2020-03-15: qty 3

## 2020-03-15 MED ORDER — ACETAMINOPHEN 325 MG PO TABS
ORAL_TABLET | ORAL | Status: AC
  Filled 2020-03-15: qty 2

## 2020-03-15 NOTE — Telephone Encounter (Signed)
Patient is aware 

## 2020-03-16 DIAGNOSIS — K5520 Angiodysplasia of colon without hemorrhage: Secondary | ICD-10-CM | POA: Diagnosis not present

## 2020-03-16 DIAGNOSIS — K31819 Angiodysplasia of stomach and duodenum without bleeding: Secondary | ICD-10-CM | POA: Diagnosis not present

## 2020-03-16 DIAGNOSIS — D500 Iron deficiency anemia secondary to blood loss (chronic): Secondary | ICD-10-CM | POA: Diagnosis not present

## 2020-03-16 DIAGNOSIS — K52831 Collagenous colitis: Secondary | ICD-10-CM | POA: Diagnosis not present

## 2020-03-16 DIAGNOSIS — K2100 Gastro-esophageal reflux disease with esophagitis, without bleeding: Secondary | ICD-10-CM | POA: Diagnosis not present

## 2020-03-16 LAB — TYPE AND SCREEN
ABO/RH(D): A POS
Antibody Screen: NEGATIVE
Unit division: 0
Unit division: 0

## 2020-03-16 LAB — BPAM RBC
Blood Product Expiration Date: 202107282359
Blood Product Expiration Date: 202107292359
ISSUE DATE / TIME: 202107281141
ISSUE DATE / TIME: 202107281414
Unit Type and Rh: 600
Unit Type and Rh: 6200

## 2020-03-17 ENCOUNTER — Encounter: Payer: Self-pay | Admitting: Internal Medicine

## 2020-03-17 DIAGNOSIS — R0902 Hypoxemia: Secondary | ICD-10-CM | POA: Diagnosis not present

## 2020-03-17 DIAGNOSIS — J449 Chronic obstructive pulmonary disease, unspecified: Secondary | ICD-10-CM | POA: Diagnosis not present

## 2020-03-19 NOTE — Progress Notes (Signed)
[place]  Telephone:(336) [phone] Fax:(336) [phone]  ID: Troy Lopez OB: [birth year]  MR#: [id number]  VQM#:[id number]  Patient Care Team: Einar Pheasant, MD as PCP - General (Internal Medicine) Lollie Sails, MD (Inactive) as Consulting Physician (Gastroenterology) Ok Edwards, NP as Nurse Practitioner (Gastroenterology) Lloyd Huger, MD as Consulting Physician (Oncology)  CHIEF COMPLAINT: [name]'s macroglobulinemia, iron deficiency anemia.  INTERVAL HISTORY: Patient returns to clinic today for repeat laboratory work, further evaluation, and consideration of IV iron.  He continues to have significant weakness and fatigue, but admits this is mildly improved after receiving 2 units of packed red blood cells recently. He has no neurologic complaints.  He denies any recent fevers or illnesses. He has a good appetite and denies weight loss.  He denies any chest pain, shortness of breath, cough, or hemoptysis.  He denies any nausea, vomiting, constipation, or diarrhea.  He denies any melena or hematochezia.  He has no urinary complaints.  Patient offers no further specific complaints today.  REVIEW OF SYSTEMS:   Review of Systems  Constitutional: Positive for malaise/fatigue. Negative for fever and weight loss.  Respiratory: Negative.  Negative for cough, hemoptysis and shortness of breath.   Cardiovascular: Negative.  Negative for chest pain and leg swelling.  Gastrointestinal: Negative.  Negative for abdominal pain, blood in stool, diarrhea and melena.  Genitourinary: Negative.  Negative for hematuria.  Musculoskeletal: Negative.  Negative for back pain.  Skin: Negative.  Negative for rash.  Neurological: Positive for weakness. Negative for dizziness, sensory change, focal weakness and headaches.  Psychiatric/Behavioral: The patient has insomnia. The patient is not nervous/anxious.     As per HPI. Otherwise, a complete review of  systems is negative.  PAST MEDICAL HISTORY: Past Medical History:  Diagnosis Date  . Adrenal gland anomaly    enlargement  . Anxiety   . CAD (coronary artery disease)   . Carpal tunnel syndrome   . Chronic hyponatremia   . Colitis   . Colonic polyp   . COPD (chronic obstructive pulmonary disease) ([place])   . Degenerative disc disease, lumbar   . Depression   . Diverticulosis   . Dyspnea   . GERD (gastroesophageal reflux disease)   . H/O degenerative disc disease   . Hypercholesterolemia   . Hyperkalemia   . Hyperlipidemia   . Hypertension   . Irritable bowel syndrome   . Monoclonal gammopathy   . Monoclonal gammopathy   . Neuropathy   . Personal history of tobacco use, presenting hazards to health 08/17/2015  . Sleep apnea   . Vertebral compression fracture ([place])     PAST SURGICAL HISTORY: Past Surgical History:  Procedure Laterality Date  . BUNIONECTOMY  1989  . CARPAL TUNNEL RELEASE Left 2011   ulnar nerve sub muscular at elbow  . CHOLECYSTECTOMY  09/07  . COLONOSCOPY WITH PROPOFOL N/A 03/05/2017   Procedure: COLONOSCOPY WITH PROPOFOL;  Surgeon: Lucilla Lame, MD;  Location: [facility] ENDOSCOPY;  Service: Endoscopy;  Laterality: N/A;  . ELECTROPHYSIOLOGIC STUDY N/A 11/15/2015   Procedure: CARDIOVERSION;  Surgeon: Isaias Cowman, MD;  Location: [facility] ORS;  Service: Cardiovascular;  Laterality: N/A;  . ESOPHAGOGASTRODUODENOSCOPY (EGD) WITH PROPOFOL N/A 03/05/2017   Procedure: ESOPHAGOGASTRODUODENOSCOPY (EGD) WITH PROPOFOL;  Surgeon: Lucilla Lame, MD;  Location: [facility] ENDOSCOPY;  Service: Endoscopy;  Laterality: N/A;  . ESOPHAGOGASTRODUODENOSCOPY (EGD) WITH PROPOFOL N/A 10/17/2017   Procedure: ESOPHAGOGASTRODUODENOSCOPY (EGD) WITH PROPOFOL;  Surgeon: Lollie Sails, MD;  Location: [facility] ENDOSCOPY;  Service: Endoscopy;  Laterality: N/A;  .  EYE SURGERY Bilateral 2010   cataract  . HEMORRHOID SURGERY    . KYPHOPLASTY N/A 11/04/2016   Procedure: KYPHOPLASTY T 12;  Surgeon:  Hessie Knows, MD;  Location: [facility] ORS;  Service: Orthopedics;  Laterality: N/A;    FAMILY HISTORY Family History  Problem Relation Age of Onset  . Stroke Mother   . Heart disease Father        MI - 11   . Colon cancer Neg Hx   . Prostate cancer Neg Hx        ADVANCED DIRECTIVES:    HEALTH MAINTENANCE: Social History   Tobacco Use  . Smoking status: Former Smoker    Packs/day: 2.00    Years: 58.00    Pack years: 116.00    Types: Cigarettes    Start date: 08/19/1957    Quit date: 11/01/2015    Years since quitting: 4.3  . Smokeless tobacco: Never Used  Vaping Use  . Vaping Use: Never used  Substance Use Topics  . Alcohol use: Yes    Alcohol/week: 42.0 standard drinks    Types: 42 Cans of beer per week    Comment: occas  . Drug use: No     Colonoscopy:  PAP:  Bone density:  Lipid panel:  Allergies  Allergen Reactions  . Iodinated Diagnostic Agents Other (See Comments)    Contraindication secondary to IGMgammopathy/Waldren's syndrome   Not to be given due to Waldenstrom's syndrome, told it could affect kidney function    Current Outpatient Medications  Medication Sig Dispense Refill  . acetaminophen (TYLENOL) 500 MG tablet Take 500-1,000 mg by mouth 3 (three) times daily as needed for moderate pain or headache.    . albuterol (VENTOLIN HFA) 108 (90 Base) MCG/ACT inhaler Inhale 2 puffs into the lungs every 6 (six) hours as needed for wheezing or shortness of breath. 18 g 11  . azelastine (ASTELIN) 0.1 % nasal spray Place 1 spray into both nostrils 2 (two) times daily. Use in each nostril as directed 30 mL 1  . budesonide (ENTOCORT EC) 3 MG 24 hr capsule Take 9 mg by mouth daily as needed (colitis flare).     . calcium-vitamin D (OSCAL WITH D) 500-200 MG-UNIT tablet Take 2 tablets by mouth daily.    Marland Kitchen ELIQUIS 5 MG TABS tablet Take 5 mg by mouth 2 (two) times daily.     Marland Kitchen FIBER PO Take 1 capsule by mouth daily.     . fluticasone (FLONASE) 50 MCG/ACT nasal spray  Place 1 spray into both nostrils daily. 16 g 2  . Fluticasone-Umeclidin-Vilant (TRELEGY ELLIPTA) 100-62.5-25 MCG/INH AEPB Inhale 1 puff into the lungs daily. 1 each 11  . furosemide (LASIX) 20 MG tablet Take 1 tablet (20 mg total) by mouth daily as needed. (Patient taking differently: 40 mg. ) 30 tablet 0  . gabapentin (NEURONTIN) 100 MG capsule Take 4 capsules (400 mg) daily in the morning and midday. Take 8 capsules (800 mg) daily in the evening. 480 capsule 0  . metoprolol tartrate (LOPRESSOR) 25 MG tablet Take 25 mg by mouth daily.     . pantoprazole (PROTONIX) 40 MG tablet Take 1 tablet (40 mg total) by mouth 2 (two) times daily. 180 tablet 0  . sertraline (ZOLOFT) 50 MG tablet Take 1 tablet (50 mg total) by mouth daily. 30 tablet 0  . triamcinolone ointment (KENALOG) 0.1 %     . vitamin B-12 (CYANOCOBALAMIN) 1000 MCG tablet Take 1,000 mcg by mouth daily.     Marland Kitchen  isosorbide mononitrate (IMDUR) 30 MG 24 hr tablet Take 1 tablet (30 mg total) by mouth once daily. (Patient not taking: Reported on 03/10/2020)     No current facility-administered medications for this visit.    OBJECTIVE: Vitals:   03/22/20 0843  BP: (!) 116/59  Pulse: 68  Temp: 98.1 F (36.7 C)  SpO2: 100%     Body mass index is 25.64 kg/m.    ECOG FS:1 - Symptomatic but completely ambulatory  General: Well-developed, well-nourished, no acute distress.  Sitting in wheelchair. Eyes: Pink conjunctiva, anicteric sclera. HEENT: Normocephalic, moist mucous membranes. Lungs: No audible wheezing or coughing. Heart: Regular rate and rhythm. Abdomen: Soft, nontender, no obvious distention. Musculoskeletal: No edema, cyanosis, or clubbing. Neuro: Alert, answering all questions appropriately. Cranial nerves grossly intact. Skin: No rashes or petechiae noted. Psych: Normal affect.   LAB RESULTS:  Lab Results  Component Value Date   NA 124 (L) 02/01/2020   K 4.5 02/01/2020   CL 93 (L) 02/01/2020   CO2 23 02/01/2020    GLUCOSE 143 (H) 02/01/2020   BUN 18 02/01/2020   CREATININE 1.12 02/01/2020   CALCIUM 9.6 02/01/2020   PROT 7.7 02/01/2020   ALBUMIN 3.7 02/01/2020   AST 13 02/01/2020   ALT 8 02/01/2020   ALKPHOS 48 02/01/2020   BILITOT 0.3 02/01/2020   GFRNONAA >60 01/06/2020   GFRAA >60 01/06/2020    Lab Results  Component Value Date   WBC 6.9 03/22/2020   NEUTROABS 5.2 03/22/2020   HGB 8.5 (L) 03/22/2020   HCT 27.5 (L) 03/22/2020   MCV 98.6 03/22/2020   PLT 341 03/22/2020   Lab Results  Component Value Date   IRON 50 03/22/2020   TIBC 923 (H) 03/22/2020   IRONPCTSAT 5 (L) 03/22/2020    Lab Results  Component Value Date   FERRITIN 43 03/22/2020   Lab Results  Component Value Date   TOTALPROTELP 8.2 10/19/2019   ALBUMINELP 3.4 10/19/2019   A1GS 0.3 10/19/2019   A2GS 0.6 10/19/2019   BETS 1.6 (H) 10/19/2019   BETA2SER 0.2 02/14/2016   GAMS 2.3 (H) 10/19/2019   MSPIKE Not Observed 10/19/2019   SPEI Comment 10/19/2019    STUDIES: No results found.  ASSESSMENT: [name]'s macroglobulinemia, iron deficiency anemia.  PLAN:    1. [name]'s macroglobulinemia:  Bone marrow biopsy results from Jan 08, 2017 reviewed independently confirming underlying [name]'s.  Patient's M spike has been erratic ranging from 1.1 in May 2018 to as high as 2.6 in October 2018.  His most recent results on October 19, 2019 did not reveal an M spike, but continued to have a persistently elevated IgM level of 3755.  Kappa free light chains are essentially unchanged at 75.3. He has persistent iron deficiency anemia from a GI source, but no other evidence of endorgan damage. He does not require any treatment at this time. If he had progression of disease as evidenced by B-symptoms or endorgan damage, will consider Rituxan-based therapy.  Continue to monitor every 3 to 6 months. 2. 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  Patient's hemoglobin has improved to 8.5 with 2 units packed red blood cells recently.  His iron stores remain significantly reduced, therefore will proceed with 510 mg IV Feraheme today.  Return to clinic in 2 weeks for repeat laboratory, further evaluation, and continuation of treatment if  needed.  Previously, a referral was made back to GI for consideration of repeat EGD and colonoscopy.     3.  Back pain: Chronic and unchanged.  Continue monitoring and treatment per pain clinic. 4.  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  Monitor.  I spent a total of 30 minutes reviewing chart data, face-to-face evaluation with the patient, counseling and coordination of care as detailed above.   Patient expressed understanding and was in agreement with this plan. He also understands that He can call clinic at any time with any questions, concerns, or complaints.    Lloyd Huger, MD   03/22/2020 2:12 PM

## 2020-03-20 ENCOUNTER — Other Ambulatory Visit: Payer: Self-pay | Admitting: Pharmacy Technician

## 2020-03-20 NOTE — Patient Outreach (Signed)
[place] [facility]) Care Management  03/20/2020  [name] Sep 27, 1940 [id number]  ADDENDUM  Incoming call received from patient in regards to medication delivery status of Eliquis with [employer] and Trelegy and Ventolin HFA with [employer].  Spoke to patient, HIPAA identifiers verified.  Patient informs he has received the Eliquis but has not received the inhalers. He informs he received a paper in the mail dated 03/13/2020 that says the medications should arrive to his home in 10 business days. Informed patient I would outreach him next week and if the inhalers had not arrived at that time, then I would reach out to [place] to check on the shipment status. Patient was agreeable to this plan.  Will follow up with patient in 5-7 business days.  Troy Lopez P. Troy Lopez, [place]  [phone]

## 2020-03-20 NOTE — Patient Outreach (Signed)
[place] [facility]) Care Management  03/20/2020  Troy Lopez Jun 22, 1941 [id number]  Unsuccessful outreach call placed to patient in regards to [employer] application for Eliquis and [place] application for Trelegy and Ventolin HFA.  Unfortunately patient did not answer the phone, HIPAA identifiers verified.  Was calling to inquire if patient had received his medications from the respective companies.  Will follow up with patient in 5-7 business days if call is not returned.  Dahir Ayer P. Audria Takeshita, [place]  [phone]

## 2020-03-21 ENCOUNTER — Ambulatory Visit (INDEPENDENT_AMBULATORY_CARE_PROVIDER_SITE_OTHER): Payer: PPO | Admitting: Pharmacist

## 2020-03-21 DIAGNOSIS — J449 Chronic obstructive pulmonary disease, unspecified: Secondary | ICD-10-CM

## 2020-03-21 DIAGNOSIS — I10 Essential (primary) hypertension: Secondary | ICD-10-CM

## 2020-03-21 DIAGNOSIS — D509 Iron deficiency anemia, unspecified: Secondary | ICD-10-CM

## 2020-03-21 DIAGNOSIS — I4892 Unspecified atrial flutter: Secondary | ICD-10-CM

## 2020-03-21 NOTE — Patient Instructions (Signed)
Visit Information  Goals Addressed              This Visit's Progress     Patient Stated   .  PharmD "I want to stay healthy" (pt-stated)        CARE PLAN ENTRY (see longtitudinal plan of care for additional care plan information)  Current Barriers:  . Social, financial, or community barriers: o Reports he had an episode last week where he passed out. [place], thinks he cracked a rib. Declined any evaluation for this.  o Notes that he struggles w/ fatigue d/t anemia. Notes that he received 2 units PRBC last week, and felt much better the next day, but energy level declined by that evening.  . Polypharmacy; complex patient with multiple medical conditions including aflutter, CAD/HTN, COPD/interstitial lung disease, MGUS, Waldenstrom macroglobulinemia, iron deficiency anemia; depression, chronic pain o IDA/MGUS, macroglobulinemia; Dr. Grayland Ormond. Questionable bleeding given continued decline in Hgb. Scheduled for EGD on 04/10/20 o COPD: Trelegy daily, Ventolin PRN. Reports that he received 3 inhalers each yesterday after he spoke to [place] for [place] assistance for Trelegy and Ventolin through 08/18/20 o CAD/HTN/A flutter: Eliquis 5 mg BID, metoprolol tartrate 25 mg BID - reported that since passing out, he has changed to take metoprolol tartrate 50 mg QPM. Has self d/c isosorbide because he is worried about passing out - APPROVED for BMS assistance for Eliquis through 08/18/20 o Depression: sertraline 50 mg daily o GERD: pantoprazole 40 mg BID o Chronic pain: Gabapentin 400 mg QAM, 400 mg midday, 800 mg QPM  Pharmacist Clinical Goal(s):  Marland Kitchen Over the next 90 days, patient will work with PharmD, CPhT, and providers to address needs related to medication access  and ensure optimized medication  Interventions: . Comprehensive medication review performed, medication list updated in electronic medical record . Inter-disciplinary care team collaboration (see longitudinal plan of  care) . Reviewed refill procedure for Trelegy and Ventolin through [place].  Marland Kitchen Discussed recent fall. Encouraged him to schedule appt w/ PCP for evaluation. He declines today, noting that his side/back pain is steadily improving. Discussed max daily dose of APAP of 4 g.  . Discussed continued SOB. He has f/u with hematology tomorrow. . Recommended that patient contact cardiology to report episode of syncope and to discuss adjustment of HTN/Afib regimen. Discussed that once daily metoprolol tartrate is generally inappropriate and only covers ~12 hours. Patient reports he is afraid to take metoprolol BID. Reiterated importance of collaboration w/ cardiology prior to self-adjustment of medications  Patient Self Care Activities:  . Self administers medications as prescribed  Please see past updates related to this goal by clicking on the "Past Updates" button in the selected goal         The patient verbalized understanding of instructions provided today and declined a print copy of patient instruction materials.    Plan:  - Scheduled f/u call in ~ 6 weeks  Catie [name], PharmD, [place], [place] Pharmacist [place] [phone]

## 2020-03-21 NOTE — Chronic Care Management (AMB) (Signed)
Chronic Care Management   Follow Up Note   03/21/2020 Name: Troy Lopez MRN: [id number] DOB: 1941/06/01  Referred by: Einar Pheasant, MD Reason for referral : Chronic Care Management (Medication Management)   Troy Lopez is a 79 y.o. year old male who is a primary care patient of Einar Pheasant, MD. The CCM team was consulted for assistance with chronic disease management and care coordination needs.    Contacted patient for medication management review   Review of patient status, including review of consultants reports, relevant laboratory and other test results, and collaboration with appropriate care team members and the patient's provider was performed as part of comprehensive patient evaluation and provision of chronic care management services.    SDOH (Social Determinants of Health) assessments performed: Yes See Care Plan activities for detailed interventions related to SDOH)  SDOH Interventions     Most Recent Value  SDOH Interventions  Financial Strain Interventions Other (Comment)  [Medication assistance]       Outpatient Encounter Medications as of 03/21/2020  Medication Sig Note   acetaminophen (TYLENOL) 500 MG tablet Take 500-1,000 mg by mouth 3 (three) times daily as needed for moderate pain or headache. 02/22/2019: 2 tablets 3 times daily    albuterol (VENTOLIN HFA) 108 (90 Base) MCG/ACT inhaler Inhale 2 puffs into the lungs every 6 (six) hours as needed for wheezing or shortness of breath.    azelastine (ASTELIN) 0.1 % nasal spray Place 1 spray into both nostrils 2 (two) times daily. Use in each nostril as directed    calcium-vitamin D (OSCAL WITH D) 500-200 MG-UNIT tablet Take 2 tablets by mouth daily.    ELIQUIS 5 MG TABS tablet Take 5 mg by mouth 2 (two) times daily.     FIBER PO Take 1 capsule by mouth daily.     fluticasone (FLONASE) 50 MCG/ACT nasal spray Place 1 spray into both nostrils daily.    Fluticasone-Umeclidin-Vilant (TRELEGY ELLIPTA)  100-62.5-25 MCG/INH AEPB Inhale 1 puff into the lungs daily.    furosemide (LASIX) 20 MG tablet Take 1 tablet (20 mg total) by mouth daily as needed. (Patient taking differently: 40 mg. ) 10/18/2019: PRN   gabapentin (NEURONTIN) 100 MG capsule Take 4 capsules (400 mg) daily in the morning and midday. Take 8 capsules (800 mg) daily in the evening.    metoprolol tartrate (LOPRESSOR) 25 MG tablet Take 25 mg by mouth daily.     pantoprazole (PROTONIX) 40 MG tablet Take 1 tablet (40 mg total) by mouth 2 (two) times daily.    sertraline (ZOLOFT) 50 MG tablet Take 1 tablet (50 mg total) by mouth daily.    vitamin B-12 (CYANOCOBALAMIN) 1000 MCG tablet Take 1,000 mcg by mouth daily.     budesonide (ENTOCORT EC) 3 MG 24 hr capsule Take 9 mg by mouth daily as needed (colitis flare).  (Patient not taking: Reported on 03/21/2020)    isosorbide mononitrate (IMDUR) 30 MG 24 hr tablet Take 1 tablet (30 mg total) by mouth once daily. (Patient not taking: Reported on 03/10/2020)    triamcinolone ointment (KENALOG) 0.1 %     [DISCONTINUED] doxycycline (VIBRA-TABS) 100 MG tablet Take 1 tablet (100 mg total) by mouth 2 (two) times daily.    No facility-administered encounter medications on file as of 03/21/2020.     Objective:   Goals Addressed              This Visit's Progress     Patient Stated  PharmD "I want to stay healthy" (pt-stated)        CARE PLAN ENTRY (see longtitudinal plan of care for additional care plan information)  Current Barriers:   Social, financial, or community barriers: o Reports he had an episode last week where he passed out. [place], thinks he cracked a rib. Declined any evaluation for this.  o Notes that he struggles w/ fatigue d/t anemia. Notes that he received 2 units PRBC last week, and felt much better the next day, but energy level declined by that evening.   Polypharmacy; complex patient with multiple medical conditions including aflutter, CAD/HTN,  COPD/interstitial lung disease, MGUS, Waldenstrom macroglobulinemia, iron deficiency anemia; depression, chronic pain o IDA/MGUS, macroglobulinemia; Dr. Grayland Ormond. Questionable bleeding given continued decline in Hgb. Scheduled for EGD on 04/10/20 o COPD: Trelegy daily, Ventolin PRN. Reports that he received 3 inhalers each yesterday after he spoke to [place] for [place] assistance for Trelegy and Ventolin through 08/18/20 o CAD/HTN/A flutter: Eliquis 5 mg BID, metoprolol tartrate 25 mg BID - reported that since passing out, he has changed to take metoprolol tartrate 50 mg QPM. Has self d/c isosorbide because he is worried about passing out - APPROVED for BMS assistance for Eliquis through 08/18/20 o Depression: sertraline 50 mg daily o GERD: pantoprazole 40 mg BID o Chronic pain: Gabapentin 400 mg QAM, 400 mg midday, 800 mg QPM  Pharmacist Clinical Goal(s):   Over the next 90 days, patient will work with PharmD, CPhT, and providers to address needs related to medication access  and ensure optimized medication  Interventions:  Comprehensive medication review performed, medication list updated in electronic medical record  Inter-disciplinary care team collaboration (see longitudinal plan of care)  Reviewed refill procedure for Trelegy and Ventolin through [place].   Discussed recent fall. Encouraged him to schedule appt w/ PCP for evaluation. He declines today, noting that his side/back pain is steadily improving. Discussed max daily dose of APAP of 4 g.   Discussed continued SOB. He has f/u with hematology tomorrow.  Recommended that patient contact cardiology to report episode of syncope and to discuss adjustment of HTN/Afib regimen. Discussed that once daily metoprolol tartrate is generally inappropriate and only covers ~12 hours. Patient reports he is afraid to take metoprolol BID. Reiterated importance of collaboration w/ cardiology prior to self-adjustment of medications  Patient Self  Care Activities:   Self administers medications as prescribed  Please see past updates related to this goal by clicking on the "Past Updates" button in the selected goal          Plan:  - Scheduled f/u call in ~ 6 weeks  Catie [name], PharmD, [place], [place] Pharmacist [place] [phone]

## 2020-03-22 ENCOUNTER — Other Ambulatory Visit: Payer: Self-pay | Admitting: Internal Medicine

## 2020-03-22 ENCOUNTER — Other Ambulatory Visit: Payer: Self-pay

## 2020-03-22 ENCOUNTER — Inpatient Hospital Stay: Payer: PPO | Attending: Oncology

## 2020-03-22 ENCOUNTER — Inpatient Hospital Stay (HOSPITAL_BASED_OUTPATIENT_CLINIC_OR_DEPARTMENT_OTHER): Payer: PPO | Admitting: Oncology

## 2020-03-22 ENCOUNTER — Telehealth: Payer: Self-pay | Admitting: Internal Medicine

## 2020-03-22 ENCOUNTER — Inpatient Hospital Stay: Payer: PPO

## 2020-03-22 ENCOUNTER — Other Ambulatory Visit: Payer: Self-pay | Admitting: Pharmacy Technician

## 2020-03-22 VITALS — BP 185/97 | HR 60 | Temp 96.0°F | Resp 18

## 2020-03-22 VITALS — BP 116/59 | HR 68 | Temp 98.1°F | Wt 178.7 lb

## 2020-03-22 DIAGNOSIS — D509 Iron deficiency anemia, unspecified: Secondary | ICD-10-CM | POA: Diagnosis not present

## 2020-03-22 DIAGNOSIS — Z86010 Personal history of colonic polyps: Secondary | ICD-10-CM | POA: Diagnosis not present

## 2020-03-22 DIAGNOSIS — Z87891 Personal history of nicotine dependence: Secondary | ICD-10-CM | POA: Diagnosis not present

## 2020-03-22 DIAGNOSIS — Z7901 Long term (current) use of anticoagulants: Secondary | ICD-10-CM | POA: Diagnosis not present

## 2020-03-22 DIAGNOSIS — I2510 Atherosclerotic heart disease of native coronary artery without angina pectoris: Secondary | ICD-10-CM | POA: Diagnosis not present

## 2020-03-22 DIAGNOSIS — I10 Essential (primary) hypertension: Secondary | ICD-10-CM | POA: Insufficient documentation

## 2020-03-22 DIAGNOSIS — E7800 Pure hypercholesterolemia, unspecified: Secondary | ICD-10-CM | POA: Diagnosis not present

## 2020-03-22 DIAGNOSIS — K589 Irritable bowel syndrome without diarrhea: Secondary | ICD-10-CM | POA: Diagnosis not present

## 2020-03-22 DIAGNOSIS — G4730 Sleep apnea, unspecified: Secondary | ICD-10-CM | POA: Insufficient documentation

## 2020-03-22 DIAGNOSIS — G8929 Other chronic pain: Secondary | ICD-10-CM | POA: Insufficient documentation

## 2020-03-22 DIAGNOSIS — K219 Gastro-esophageal reflux disease without esophagitis: Secondary | ICD-10-CM | POA: Insufficient documentation

## 2020-03-22 DIAGNOSIS — J449 Chronic obstructive pulmonary disease, unspecified: Secondary | ICD-10-CM | POA: Insufficient documentation

## 2020-03-22 DIAGNOSIS — M549 Dorsalgia, unspecified: Secondary | ICD-10-CM | POA: Insufficient documentation

## 2020-03-22 DIAGNOSIS — C880 Waldenstrom macroglobulinemia: Secondary | ICD-10-CM

## 2020-03-22 DIAGNOSIS — F418 Other specified anxiety disorders: Secondary | ICD-10-CM | POA: Insufficient documentation

## 2020-03-22 DIAGNOSIS — E785 Hyperlipidemia, unspecified: Secondary | ICD-10-CM | POA: Insufficient documentation

## 2020-03-22 DIAGNOSIS — Z79899 Other long term (current) drug therapy: Secondary | ICD-10-CM | POA: Insufficient documentation

## 2020-03-22 LAB — CBC WITH DIFFERENTIAL/PLATELET
Abs Immature Granulocytes: 0.03 10*3/uL (ref 0.00–0.07)
Basophils Absolute: 0 10*3/uL (ref 0.0–0.1)
Basophils Relative: 1 %
Eosinophils Absolute: 0 10*3/uL (ref 0.0–0.5)
Eosinophils Relative: 1 %
HCT: 27.5 % — ABNORMAL LOW (ref 39.0–52.0)
Hemoglobin: 8.5 g/dL — ABNORMAL LOW (ref 13.0–17.0)
Immature Granulocytes: 0 %
Lymphocytes Relative: 15 %
Lymphs Abs: 1.1 10*3/uL (ref 0.7–4.0)
MCH: 30.5 pg (ref 26.0–34.0)
MCHC: 30.9 g/dL (ref 30.0–36.0)
MCV: 98.6 fL (ref 80.0–100.0)
Monocytes Absolute: 0.5 10*3/uL (ref 0.1–1.0)
Monocytes Relative: 8 %
Neutro Abs: 5.2 10*3/uL (ref 1.7–7.7)
Neutrophils Relative %: 75 %
Platelets: 341 10*3/uL (ref 150–400)
RBC: 2.79 MIL/uL — ABNORMAL LOW (ref 4.22–5.81)
RDW: 18.9 % — ABNORMAL HIGH (ref 11.5–15.5)
WBC: 6.9 10*3/uL (ref 4.0–10.5)
nRBC: 0 % (ref 0.0–0.2)

## 2020-03-22 LAB — IRON AND TIBC
Iron: 50 ug/dL (ref 45–182)
Saturation Ratios: 5 % — ABNORMAL LOW (ref 17.9–39.5)
TIBC: 923 ug/dL — ABNORMAL HIGH (ref 250–450)
UIBC: 873 ug/dL

## 2020-03-22 LAB — SAMPLE TO BLOOD BANK

## 2020-03-22 LAB — FERRITIN: Ferritin: 43 ng/mL (ref 24–336)

## 2020-03-22 MED ORDER — SODIUM CHLORIDE 0.9 % IV SOLN
510.0000 mg | Freq: Once | INTRAVENOUS | Status: AC
  Administered 2020-03-22: 510 mg via INTRAVENOUS
  Filled 2020-03-22: qty 510

## 2020-03-22 MED ORDER — SODIUM CHLORIDE 0.9 % IV SOLN
Freq: Once | INTRAVENOUS | Status: AC
  Filled 2020-03-22: qty 250

## 2020-03-22 NOTE — Telephone Encounter (Signed)
Received note from Catie. Pt fell (or had syncopal episode) recently.  States injured ribs.  In reviewing chart, he mentioned this at pulmonary visit.  Had previously declined xray.  Please confirm with pt - doing ok.  Agree with Catie, he needs cardiology evaluation for syncope and for medication adjustments.  He has adjust some on his own.  (also, I reviewed GI's note and they are wanting cardiac clearance prior to his EGD).

## 2020-03-22 NOTE — Telephone Encounter (Signed)
Confirmed doing ok. Patient has EGD schedule for 8/23. Cardiology appt is not until September, Advised that he will need to see them prior to having his scope done.

## 2020-03-22 NOTE — Progress Notes (Signed)
I have reviewed the above note and agree.  Reviewed information from Catie (CCM) and reviewed chart.  Reported fall at pulmonary visit.  Previously declined xray.  Will have nurse f/uw with pt regarding further evaluation.  Agree with need for cardiology f/u.  I was available to the pharmacist for consultation.  Dr Nicki Reaper

## 2020-03-22 NOTE — Patient Outreach (Signed)
[facility]) Care Management  03/22/2020  Troy Lopez 1940-10-24 [id number]   Incoming in basket message received from embedded pharmacist Catie [name] regarding patient assistance medication delivery of Trelegy and Ventolin HFA from [place], Received the following message   [name], [place]  Susano Cleckler, Luiz Ochoa, CPhT Received [place] shipment after he got off the phone with you yesterday. 3 of each. Reviewed refill procedure. THANKS    Follow up:  Will route note to embedded [facility] RPh Catie [name] for case closure  Kasidee Voisin P. Kairee Isa, [place]  [phone]

## 2020-03-24 NOTE — Telephone Encounter (Signed)
Moved cardiology appt up to 8/10. Patient is aware.

## 2020-03-28 DIAGNOSIS — I2510 Atherosclerotic heart disease of native coronary artery without angina pectoris: Secondary | ICD-10-CM | POA: Diagnosis not present

## 2020-03-31 ENCOUNTER — Telehealth: Payer: Self-pay | Admitting: Internal Medicine

## 2020-03-31 NOTE — Telephone Encounter (Signed)
Called and spoke to Sam with [facility] [employer]. Sam stated that patients insurance is not in network with [facility]. [facility] for [facility] for second opinion.    Routing to Dr. Chase Caller as an Juluis Rainier.

## 2020-04-01 NOTE — Progress Notes (Signed)
[facility]  Telephone:([phone]) [phone] Fax:(336) [phone]  ID: Troy Lopez OB: May 15, 1941  MR#: [id number]  IPJ#:[id number]  Patient Care Team: Einar Pheasant, MD as PCP - General (Internal Medicine) Lollie Sails, MD (Inactive) as Consulting Physician (Gastroenterology) Ok Edwards, NP as Nurse Practitioner (Gastroenterology) Lloyd Huger, MD as Consulting Physician (Oncology)  CHIEF COMPLAINT: [name]'s macroglobulinemia, iron deficiency anemia.  INTERVAL HISTORY: Patient returns to clinic today for repeat laboratory work, further evaluation, and consideration of additional IV Feraheme.  He is being treated for an infected foot and states they are considering amputation.  He also had a fall after getting out of bed in the middle of the night too quickly and has large ecchymosis on the right side of his face.  He continues to have chronic weakness and fatigue. He has no neurologic complaints.  He denies any recent fevers or illnesses. He has a good appetite and denies weight loss.  He denies any chest pain, shortness of breath, cough, or hemoptysis.  He denies any nausea, vomiting, constipation, or diarrhea.  He denies any melena or hematochezia.  He has no urinary complaints.  Patient offers no further specific complaints today.  REVIEW OF SYSTEMS:   Review of Systems  Constitutional: Positive for malaise/fatigue. Negative for fever and weight loss.  Respiratory: Negative.  Negative for cough, hemoptysis and shortness of breath.   Cardiovascular: Negative.  Negative for chest pain and leg swelling.  Gastrointestinal: Negative.  Negative for abdominal pain, blood in stool, diarrhea and melena.  Genitourinary: Negative.  Negative for hematuria.  Musculoskeletal: Positive for falls. Negative for back pain.  Skin: Negative.  Negative for rash.  Neurological: Positive for weakness. Negative for dizziness, sensory change, focal weakness and  headaches.  Psychiatric/Behavioral: The patient is not nervous/anxious and does not have insomnia.     As per HPI. Otherwise, a complete review of systems is negative.  PAST MEDICAL HISTORY: Past Medical History:  Diagnosis Date  . Adrenal gland anomaly    enlargement  . Anxiety   . CAD (coronary artery disease)   . Carpal tunnel syndrome   . Chronic hyponatremia   . Colitis   . Colonic polyp   . COPD (chronic obstructive pulmonary disease) ([place])   . Degenerative disc disease, lumbar   . Depression   . Diverticulosis   . Dyspnea   . GERD (gastroesophageal reflux disease)   . H/O degenerative disc disease   . Hypercholesterolemia   . Hyperkalemia   . Hyperlipidemia   . Hypertension   . Irritable bowel syndrome   . Monoclonal gammopathy   . Monoclonal gammopathy   . Neuropathy   . Personal history of tobacco use, presenting hazards to health 08/17/2015  . Sleep apnea   . Vertebral compression fracture ([place])     PAST SURGICAL HISTORY: Past Surgical History:  Procedure Laterality Date  . BUNIONECTOMY  1989  . CARPAL TUNNEL RELEASE Left 2011   ulnar nerve sub muscular at elbow  . CHOLECYSTECTOMY  09/07  . COLONOSCOPY WITH PROPOFOL N/A 03/05/2017   Procedure: COLONOSCOPY WITH PROPOFOL;  Surgeon: Lucilla Lame, MD;  Location: [facility] ENDOSCOPY;  Service: Endoscopy;  Laterality: N/A;  . ELECTROPHYSIOLOGIC STUDY N/A 11/15/2015   Procedure: CARDIOVERSION;  Surgeon: Isaias Cowman, MD;  Location: [facility] ORS;  Service: Cardiovascular;  Laterality: N/A;  . ESOPHAGOGASTRODUODENOSCOPY (EGD) WITH PROPOFOL N/A 03/05/2017   Procedure: ESOPHAGOGASTRODUODENOSCOPY (EGD) WITH PROPOFOL;  Surgeon: Lucilla Lame, MD;  Location: [facility] ENDOSCOPY;  Service: Endoscopy;  Laterality:  N/A;  . ESOPHAGOGASTRODUODENOSCOPY (EGD) WITH PROPOFOL N/A 10/17/2017   Procedure: ESOPHAGOGASTRODUODENOSCOPY (EGD) WITH PROPOFOL;  Surgeon: Lollie Sails, MD;  Location: [facility] ENDOSCOPY;  Service: Endoscopy;   Laterality: N/A;  . EYE SURGERY Bilateral 2010   cataract  . HEMORRHOID SURGERY    . KYPHOPLASTY N/A 11/04/2016   Procedure: KYPHOPLASTY T 12;  Surgeon: Hessie Knows, MD;  Location: [facility] ORS;  Service: Orthopedics;  Laterality: N/A;    FAMILY HISTORY Family History  Problem Relation Age of Onset  . Stroke Mother   . Heart disease Father        MI - 75   . Colon cancer Neg Hx   . Prostate cancer Neg Hx        ADVANCED DIRECTIVES:    HEALTH MAINTENANCE: Social History   Tobacco Use  . Smoking status: Former Smoker    Packs/day: 2.00    Years: 58.00    Pack years: 116.00    Types: Cigarettes    Start date: 08/19/1957    Quit date: 11/01/2015    Years since quitting: 4.4  . Smokeless tobacco: Never Used  Vaping Use  . Vaping Use: Never used  Substance Use Topics  . Alcohol use: Yes    Alcohol/week: 42.0 standard drinks    Types: 42 Cans of beer per week    Comment: occas  . Drug use: No     Colonoscopy:  PAP:  Bone density:  Lipid panel:  Allergies  Allergen Reactions  . Iodinated Diagnostic Agents Other (See Comments)    Contraindication secondary to IGMgammopathy/Waldren's syndrome   Not to be given due to Waldenstrom's syndrome, told it could affect kidney function    Current Outpatient Medications  Medication Sig Dispense Refill  . acetaminophen (TYLENOL) 500 MG tablet Take 500-1,000 mg by mouth 3 (three) times daily as needed for moderate pain or headache.    . albuterol (VENTOLIN HFA) 108 (90 Base) MCG/ACT inhaler Inhale 2 puffs into the lungs every 6 (six) hours as needed for wheezing or shortness of breath. 18 g 11  . azelastine (ASTELIN) 0.1 % nasal spray Place 1 spray into both nostrils 2 (two) times daily. Use in each nostril as directed 30 mL 1  . budesonide (ENTOCORT EC) 3 MG 24 hr capsule Take 9 mg by mouth daily as needed (colitis flare).     . calcium-vitamin D (OSCAL WITH D) 500-200 MG-UNIT tablet Take 2 tablets by mouth daily.    Marland Kitchen  doxycycline (VIBRAMYCIN) 100 MG capsule Take 100 mg by mouth 2 (two) times daily.    Marland Kitchen ELIQUIS 5 MG TABS tablet Take 5 mg by mouth 2 (two) times daily.     Marland Kitchen FIBER PO Take 1 capsule by mouth daily.     . fluticasone (FLONASE) 50 MCG/ACT nasal spray Place 1 spray into both nostrils daily. 16 g 2  . Fluticasone-Umeclidin-Vilant (TRELEGY ELLIPTA) 100-62.5-25 MCG/INH AEPB Inhale 1 puff into the lungs daily. 1 each 11  . furosemide (LASIX) 20 MG tablet Take 1 tablet (20 mg total) by mouth daily as needed. (Patient taking differently: 40 mg. ) 30 tablet 0  . gabapentin (NEURONTIN) 100 MG capsule Take 4 capsules (400 mg) daily in the morning and midday. Take 8 capsules (800 mg) daily in the evening. 480 capsule 0  . metoprolol tartrate (LOPRESSOR) 100 MG tablet Take 1 tablet (100 mg total) by mouth 2 (two) times daily. 60 tablet 0  . metoprolol tartrate (LOPRESSOR) 25 MG tablet Take 25  mg by mouth daily.     . pantoprazole (PROTONIX) 40 MG tablet Take 1 tablet (40 mg total) by mouth 2 (two) times daily. 180 tablet 0  . sertraline (ZOLOFT) 50 MG tablet Take 1 tablet (50 mg total) by mouth daily. 30 tablet 0  . triamcinolone ointment (KENALOG) 0.1 %     . vitamin B-12 (CYANOCOBALAMIN) 1000 MCG tablet Take 1,000 mcg by mouth daily.     . isosorbide mononitrate (IMDUR) 30 MG 24 hr tablet Take 1 tablet (30 mg total) by mouth once daily. (Patient not taking: Reported on 03/10/2020)     No current facility-administered medications for this visit.   Facility-Administered Medications Ordered in Other Visits  Medication Dose Route Frequency Provider Last Rate Last Admin  . 0.9 %  sodium chloride infusion   Intravenous Once Lloyd Huger, MD      . ferumoxytol [facility]) 510 mg in sodium chloride 0.9 % 100 mL IVPB  510 mg Intravenous Once Lloyd Huger, MD        OBJECTIVE: Vitals:   04/07/20 1411  BP: 128/69  Pulse: 67  Resp: 20  Temp: 98.6 F (37 C)  SpO2: 99%     Body mass index is 25.47  kg/m.    ECOG FS:1 - Symptomatic but completely ambulatory  General: Well-developed, well-nourished, no acute distress. Eyes: Pink conjunctiva, anicteric sclera. HEENT: Normocephalic, moist mucous membranes. Lungs: No audible wheezing or coughing. Heart: Regular rate and rhythm. Abdomen: Soft, nontender, no obvious distention. Musculoskeletal: No edema, cyanosis, or clubbing. Neuro: Alert, answering all questions appropriately. Cranial nerves grossly intact. Skin: No rashes or petechiae noted. Psych: Normal affect.   LAB RESULTS:  Lab Results  Component Value Date   NA 124 (L) 02/01/2020   K 4.5 02/01/2020   CL 93 (L) 02/01/2020   CO2 23 02/01/2020   GLUCOSE 143 (H) 02/01/2020   BUN 18 02/01/2020   CREATININE 1.12 02/01/2020   CALCIUM 9.6 02/01/2020   PROT 7.7 02/01/2020   ALBUMIN 3.7 02/01/2020   AST 13 02/01/2020   ALT 8 02/01/2020   ALKPHOS 48 02/01/2020   BILITOT 0.3 02/01/2020   GFRNONAA >60 01/06/2020   GFRAA >60 01/06/2020    Lab Results  Component Value Date   WBC 7.5 04/07/2020   NEUTROABS 5.3 04/07/2020   HGB 9.6 (L) 04/07/2020   HCT 29.4 (L) 04/07/2020   MCV 101.7 (H) 04/07/2020   PLT 321 04/07/2020   Lab Results  Component Value Date   IRON 50 03/22/2020   TIBC 923 (H) 03/22/2020   IRONPCTSAT 5 (L) 03/22/2020    Lab Results  Component Value Date   FERRITIN 43 03/22/2020   Lab Results  Component Value Date   TOTALPROTELP 8.2 10/19/2019   ALBUMINELP 3.4 10/19/2019   A1GS 0.3 10/19/2019   A2GS 0.6 10/19/2019   BETS 1.6 (H) 10/19/2019   BETA2SER 0.2 02/14/2016   GAMS 2.3 (H) 10/19/2019   MSPIKE Not Observed 10/19/2019   SPEI Comment 10/19/2019    STUDIES: No results found.  ASSESSMENT: [name]'s macroglobulinemia, iron deficiency anemia.  PLAN:    1. [name]'s macroglobulinemia:  Bone marrow biopsy results from Jan 08, 2017 reviewed independently confirming underlying [name]'s.  Patient's M spike has been erratic  ranging from 1.1 in May 2018 to as high as 2.6 in October 2018.  His most recent results on October 19, 2019 did not reveal an M spike, but continued to have a persistently elevated IgM level of 3755.  Kappa free light chains are essentially unchanged at 75.3. He has persistent iron deficiency anemia from a GI source, but no other evidence of endorgan damage. He does not require any treatment at this time. If he had progression of disease as evidenced by B-symptoms or endorgan damage, will consider Rituxan-based therapy.  Continue to monitor every 3 to 6 months. 2. 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  His hemoglobin remains decreased, but has trended up to 9.6.  Iron stores are pending at time of dictation.  He does not require blood transfusion, but will proceed with 510 mg IV Feraheme today.  Return to clinic in 1 week for second infusion.  Patient will then return to clinic in 8 weeks for repeat laboratory work and further evaluation.  Patient reports he has an EGD in the next 1 to 2 weeks.    3.  Back pain: Chronic and unchanged.  Continue monitoring and treatment per pain clinic. 4.  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  Monitor.  5.  Toe infection: Continue follow-up with podiatry as scheduled.  I spent a total of 30 minutes reviewing chart data, face-to-face evaluation with the patient, counseling and coordination of care as detailed above.   Patient expressed understanding and was in agreement with this plan. He also understands that He can call clinic at any time with any questions, concerns, or complaints.    Lloyd Huger, MD   04/07/2020 2:45 PM

## 2020-04-02 NOTE — Telephone Encounter (Signed)
Just let patient know he might have additional cost due to out of network. If we need to write a letter to insurance company he should call [place] and find out out of [employer] and we can help accordingly

## 2020-04-03 ENCOUNTER — Telehealth: Payer: Self-pay | Admitting: Internal Medicine

## 2020-04-03 DIAGNOSIS — J449 Chronic obstructive pulmonary disease, unspecified: Secondary | ICD-10-CM

## 2020-04-03 NOTE — Telephone Encounter (Signed)
Left message for patient

## 2020-04-03 NOTE — Telephone Encounter (Signed)
Troy Lopez (cc Raquel Sarna)   Re Troy Lopez -fill out a form from [facility] gastroenterology department for preop clearance. I have filled this and I will give it to [place] today. However please note that in my comment that I put a conditional clearance for upper endoscopy. Because patient has COPD and nocturnal desaturations it is probably best done with anesthesia support. They also asked about holding guidelines for anticoagulation. This is best decided by the gastroenterologist based on the medicine patient might or might not be on. I also for this, thank you      SIGNATURE    Dr. Brand Males, M.D., F.C.C.P,  Pulmonary and Critical Care Medicine Staff Physician, [place] Director - Interstitial Lung Disease  Program  Pulmonary [place] at [place], [place], [address]  Pager: [phone], If no answer  OR between  19:00-7:00h: page [phone] Telephone (clinical office): [phone] Telephone (research): [phone]  12:17 PM 04/03/2020

## 2020-04-03 NOTE — Telephone Encounter (Signed)
Troy Lopez had overnight oxygen study on March 17, 2020.  This was done on room air.  Time spent less than or equal to 88% was 4 hours and 6 minutes  Plan -Start 2 L nasal cannula oxygen at night [I think you might have refused in the past -in the last few weeks.  Please check my note double check.]

## 2020-04-03 NOTE — Telephone Encounter (Signed)
Raquel Sarna, please let me know when clearance has been faxed back to GI. Thanks

## 2020-04-03 NOTE — Telephone Encounter (Signed)
Called and spoke to patient.  Patient stated that he was not looking for a second opinion. He was inquiring about a clinical trial that [facility] is offering. Patient will reach out to [facility] to see if they are still offering this trial. Patient will call back if anything is needed on our end.   Will route to MR as an FYI.

## 2020-04-03 NOTE — Telephone Encounter (Signed)
Patient did not refuse night time oxygen. ONO was repeated due to it being longer then 30 days from ONO to OV.  Patient is aware of results and voiced his understanding.  Order has been placed for nighttime oxygen, as patient was agreeable.  Nothing further is needed at this time.

## 2020-04-04 ENCOUNTER — Other Ambulatory Visit: Payer: Self-pay | Admitting: Internal Medicine

## 2020-04-04 NOTE — Telephone Encounter (Signed)
Noted  

## 2020-04-04 NOTE — Telephone Encounter (Signed)
Clearance form  Has been faxed back to [facility] for pt.

## 2020-04-05 DIAGNOSIS — L02611 Cutaneous abscess of right foot: Secondary | ICD-10-CM | POA: Diagnosis not present

## 2020-04-05 DIAGNOSIS — S93114A Dislocation of interphalangeal joint of right lesser toe(s), initial encounter: Secondary | ICD-10-CM | POA: Diagnosis not present

## 2020-04-05 DIAGNOSIS — G629 Polyneuropathy, unspecified: Secondary | ICD-10-CM | POA: Diagnosis not present

## 2020-04-05 DIAGNOSIS — L97512 Non-pressure chronic ulcer of other part of right foot with fat layer exposed: Secondary | ICD-10-CM | POA: Diagnosis not present

## 2020-04-05 DIAGNOSIS — L03115 Cellulitis of right lower limb: Secondary | ICD-10-CM | POA: Diagnosis not present

## 2020-04-06 ENCOUNTER — Other Ambulatory Visit: Payer: Self-pay

## 2020-04-06 ENCOUNTER — Other Ambulatory Visit
Admission: RE | Admit: 2020-04-06 | Discharge: 2020-04-06 | Disposition: A | Discharge status: Home / Self Care | Payer: PPO | Source: Ambulatory Visit | Attending: Internal Medicine | Admitting: Internal Medicine

## 2020-04-06 DIAGNOSIS — D509 Iron deficiency anemia, unspecified: Secondary | ICD-10-CM

## 2020-04-06 NOTE — Progress Notes (Signed)
Patient was called for pre assessment. He states a week ago he ended up falling and hurting his foot. He states he has pain that comes and goes in right foot and rates pain at 6. He denies any question or concerns at this time.

## 2020-04-07 ENCOUNTER — Encounter: Payer: Self-pay | Admitting: Oncology

## 2020-04-07 ENCOUNTER — Inpatient Hospital Stay: Payer: PPO

## 2020-04-07 ENCOUNTER — Other Ambulatory Visit
Admission: RE | Admit: 2020-04-07 | Discharge: 2020-04-07 | Disposition: A | Discharge status: Home / Self Care | Payer: PPO | Source: Ambulatory Visit | Attending: Internal Medicine | Admitting: Internal Medicine

## 2020-04-07 ENCOUNTER — Inpatient Hospital Stay (HOSPITAL_BASED_OUTPATIENT_CLINIC_OR_DEPARTMENT_OTHER): Payer: PPO | Admitting: Oncology

## 2020-04-07 ENCOUNTER — Encounter: Payer: Self-pay | Admitting: Internal Medicine

## 2020-04-07 VITALS — BP 128/69 | HR 67 | Temp 98.6°F | Resp 20 | Wt 177.5 lb

## 2020-04-07 VITALS — BP 149/77 | HR 65 | Resp 18

## 2020-04-07 DIAGNOSIS — D509 Iron deficiency anemia, unspecified: Secondary | ICD-10-CM

## 2020-04-07 DIAGNOSIS — R600 Localized edema: Secondary | ICD-10-CM | POA: Diagnosis not present

## 2020-04-07 DIAGNOSIS — J449 Chronic obstructive pulmonary disease, unspecified: Secondary | ICD-10-CM | POA: Diagnosis present

## 2020-04-07 DIAGNOSIS — C880 Waldenstrom macroglobulinemia: Secondary | ICD-10-CM

## 2020-04-07 DIAGNOSIS — D500 Iron deficiency anemia secondary to blood loss (chronic): Secondary | ICD-10-CM | POA: Diagnosis present

## 2020-04-07 DIAGNOSIS — M7989 Other specified soft tissue disorders: Secondary | ICD-10-CM | POA: Diagnosis not present

## 2020-04-07 DIAGNOSIS — M19071 Primary osteoarthritis, right ankle and foot: Secondary | ICD-10-CM | POA: Diagnosis not present

## 2020-04-07 DIAGNOSIS — Q2733 Arteriovenous malformation of digestive system vessel: Secondary | ICD-10-CM | POA: Diagnosis not present

## 2020-04-07 DIAGNOSIS — K589 Irritable bowel syndrome without diarrhea: Secondary | ICD-10-CM | POA: Diagnosis present

## 2020-04-07 DIAGNOSIS — L97512 Non-pressure chronic ulcer of other part of right foot with fat layer exposed: Secondary | ICD-10-CM | POA: Diagnosis not present

## 2020-04-07 DIAGNOSIS — E1140 Type 2 diabetes mellitus with diabetic neuropathy, unspecified: Secondary | ICD-10-CM | POA: Diagnosis present

## 2020-04-07 DIAGNOSIS — L03031 Cellulitis of right toe: Secondary | ICD-10-CM | POA: Diagnosis present

## 2020-04-07 DIAGNOSIS — E785 Hyperlipidemia, unspecified: Secondary | ICD-10-CM | POA: Diagnosis present

## 2020-04-07 DIAGNOSIS — K31819 Angiodysplasia of stomach and duodenum without bleeding: Secondary | ICD-10-CM | POA: Diagnosis present

## 2020-04-07 DIAGNOSIS — F329 Major depressive disorder, single episode, unspecified: Secondary | ICD-10-CM | POA: Diagnosis present

## 2020-04-07 DIAGNOSIS — E11621 Type 2 diabetes mellitus with foot ulcer: Secondary | ICD-10-CM | POA: Diagnosis present

## 2020-04-07 DIAGNOSIS — I4892 Unspecified atrial flutter: Secondary | ICD-10-CM | POA: Diagnosis present

## 2020-04-07 DIAGNOSIS — E7800 Pure hypercholesterolemia, unspecified: Secondary | ICD-10-CM | POA: Diagnosis present

## 2020-04-07 DIAGNOSIS — L02611 Cutaneous abscess of right foot: Secondary | ICD-10-CM | POA: Diagnosis not present

## 2020-04-07 DIAGNOSIS — K219 Gastro-esophageal reflux disease without esophagitis: Secondary | ICD-10-CM | POA: Diagnosis present

## 2020-04-07 DIAGNOSIS — M549 Dorsalgia, unspecified: Secondary | ICD-10-CM | POA: Diagnosis present

## 2020-04-07 DIAGNOSIS — E1136 Type 2 diabetes mellitus with diabetic cataract: Secondary | ICD-10-CM | POA: Diagnosis present

## 2020-04-07 DIAGNOSIS — L03115 Cellulitis of right lower limb: Secondary | ICD-10-CM | POA: Diagnosis present

## 2020-04-07 DIAGNOSIS — G4730 Sleep apnea, unspecified: Secondary | ICD-10-CM | POA: Diagnosis present

## 2020-04-07 DIAGNOSIS — L089 Local infection of the skin and subcutaneous tissue, unspecified: Secondary | ICD-10-CM | POA: Diagnosis not present

## 2020-04-07 DIAGNOSIS — E1169 Type 2 diabetes mellitus with other specified complication: Secondary | ICD-10-CM | POA: Diagnosis present

## 2020-04-07 DIAGNOSIS — F419 Anxiety disorder, unspecified: Secondary | ICD-10-CM | POA: Diagnosis present

## 2020-04-07 DIAGNOSIS — I10 Essential (primary) hypertension: Secondary | ICD-10-CM | POA: Diagnosis present

## 2020-04-07 DIAGNOSIS — I2510 Atherosclerotic heart disease of native coronary artery without angina pectoris: Secondary | ICD-10-CM | POA: Diagnosis present

## 2020-04-07 DIAGNOSIS — Z20822 Contact with and (suspected) exposure to covid-19: Secondary | ICD-10-CM | POA: Diagnosis present

## 2020-04-07 DIAGNOSIS — M2011 Hallux valgus (acquired), right foot: Secondary | ICD-10-CM | POA: Diagnosis not present

## 2020-04-07 DIAGNOSIS — S93114A Dislocation of interphalangeal joint of right lesser toe(s), initial encounter: Secondary | ICD-10-CM | POA: Diagnosis not present

## 2020-04-07 DIAGNOSIS — Z01812 Encounter for preprocedural laboratory examination: Secondary | ICD-10-CM | POA: Insufficient documentation

## 2020-04-07 DIAGNOSIS — G8929 Other chronic pain: Secondary | ICD-10-CM | POA: Diagnosis present

## 2020-04-07 DIAGNOSIS — M5136 Other intervertebral disc degeneration, lumbar region: Secondary | ICD-10-CM | POA: Diagnosis present

## 2020-04-07 DIAGNOSIS — J441 Chronic obstructive pulmonary disease with (acute) exacerbation: Secondary | ICD-10-CM | POA: Diagnosis not present

## 2020-04-07 DIAGNOSIS — E871 Hypo-osmolality and hyponatremia: Secondary | ICD-10-CM | POA: Diagnosis present

## 2020-04-07 LAB — FERRITIN: Ferritin: 69 ng/mL (ref 24–336)

## 2020-04-07 LAB — IRON AND TIBC
Iron: 101 ug/dL (ref 45–182)
Saturation Ratios: 11 % — ABNORMAL LOW (ref 17.9–39.5)
TIBC: 910 ug/dL — ABNORMAL HIGH (ref 250–450)
UIBC: 809 ug/dL

## 2020-04-07 LAB — CBC WITH DIFFERENTIAL/PLATELET
Abs Immature Granulocytes: 0.02 10*3/uL (ref 0.00–0.07)
Basophils Absolute: 0.1 10*3/uL (ref 0.0–0.1)
Basophils Relative: 1 %
Eosinophils Absolute: 0 10*3/uL (ref 0.0–0.5)
Eosinophils Relative: 0 %
HCT: 29.4 % — ABNORMAL LOW (ref 39.0–52.0)
Hemoglobin: 9.6 g/dL — ABNORMAL LOW (ref 13.0–17.0)
Immature Granulocytes: 0 %
Lymphocytes Relative: 18 %
Lymphs Abs: 1.4 10*3/uL (ref 0.7–4.0)
MCH: 33.2 pg (ref 26.0–34.0)
MCHC: 32.7 g/dL (ref 30.0–36.0)
MCV: 101.7 fL — ABNORMAL HIGH (ref 80.0–100.0)
Monocytes Absolute: 0.7 10*3/uL (ref 0.1–1.0)
Monocytes Relative: 9 %
Neutro Abs: 5.3 10*3/uL (ref 1.7–7.7)
Neutrophils Relative %: 72 %
Platelets: 321 10*3/uL (ref 150–400)
RBC: 2.89 MIL/uL — ABNORMAL LOW (ref 4.22–5.81)
RDW: 20.9 % — ABNORMAL HIGH (ref 11.5–15.5)
WBC: 7.5 10*3/uL (ref 4.0–10.5)
nRBC: 0 % (ref 0.0–0.2)

## 2020-04-07 LAB — SARS CORONAVIRUS 2 (TAT 6-24 HRS): SARS Coronavirus 2: NEGATIVE

## 2020-04-07 LAB — SAMPLE TO BLOOD BANK

## 2020-04-07 MED ORDER — SODIUM CHLORIDE 0.9 % IV SOLN
Freq: Once | INTRAVENOUS | Status: AC
  Filled 2020-04-07: qty 250

## 2020-04-07 MED ORDER — SODIUM CHLORIDE 0.9 % IV SOLN
510.0000 mg | Freq: Once | INTRAVENOUS | Status: AC
  Administered 2020-04-07: 510 mg via INTRAVENOUS
  Filled 2020-04-07: qty 510

## 2020-04-08 ENCOUNTER — Other Ambulatory Visit: Payer: Self-pay

## 2020-04-08 DIAGNOSIS — Z79899 Other long term (current) drug therapy: Secondary | ICD-10-CM

## 2020-04-08 DIAGNOSIS — L03115 Cellulitis of right lower limb: Secondary | ICD-10-CM | POA: Diagnosis not present

## 2020-04-08 DIAGNOSIS — G4730 Sleep apnea, unspecified: Secondary | ICD-10-CM | POA: Diagnosis present

## 2020-04-08 DIAGNOSIS — E1169 Type 2 diabetes mellitus with other specified complication: Secondary | ICD-10-CM | POA: Diagnosis present

## 2020-04-08 DIAGNOSIS — F419 Anxiety disorder, unspecified: Secondary | ICD-10-CM | POA: Diagnosis present

## 2020-04-08 DIAGNOSIS — E871 Hypo-osmolality and hyponatremia: Secondary | ICD-10-CM | POA: Diagnosis present

## 2020-04-08 DIAGNOSIS — I4892 Unspecified atrial flutter: Secondary | ICD-10-CM | POA: Diagnosis present

## 2020-04-08 DIAGNOSIS — I2510 Atherosclerotic heart disease of native coronary artery without angina pectoris: Secondary | ICD-10-CM | POA: Diagnosis present

## 2020-04-08 DIAGNOSIS — Z91041 Radiographic dye allergy status: Secondary | ICD-10-CM

## 2020-04-08 DIAGNOSIS — L03031 Cellulitis of right toe: Secondary | ICD-10-CM | POA: Diagnosis present

## 2020-04-08 DIAGNOSIS — D500 Iron deficiency anemia secondary to blood loss (chronic): Secondary | ICD-10-CM | POA: Diagnosis present

## 2020-04-08 DIAGNOSIS — Z7901 Long term (current) use of anticoagulants: Secondary | ICD-10-CM

## 2020-04-08 DIAGNOSIS — E7800 Pure hypercholesterolemia, unspecified: Secondary | ICD-10-CM | POA: Diagnosis present

## 2020-04-08 DIAGNOSIS — K31819 Angiodysplasia of stomach and duodenum without bleeding: Secondary | ICD-10-CM | POA: Diagnosis present

## 2020-04-08 DIAGNOSIS — M5136 Other intervertebral disc degeneration, lumbar region: Secondary | ICD-10-CM | POA: Diagnosis present

## 2020-04-08 DIAGNOSIS — Z86010 Personal history of colonic polyps: Secondary | ICD-10-CM

## 2020-04-08 DIAGNOSIS — K589 Irritable bowel syndrome without diarrhea: Secondary | ICD-10-CM | POA: Diagnosis present

## 2020-04-08 DIAGNOSIS — Z01812 Encounter for preprocedural laboratory examination: Secondary | ICD-10-CM

## 2020-04-08 DIAGNOSIS — F329 Major depressive disorder, single episode, unspecified: Secondary | ICD-10-CM | POA: Diagnosis present

## 2020-04-08 DIAGNOSIS — E785 Hyperlipidemia, unspecified: Secondary | ICD-10-CM | POA: Diagnosis present

## 2020-04-08 DIAGNOSIS — K219 Gastro-esophageal reflux disease without esophagitis: Secondary | ICD-10-CM | POA: Diagnosis present

## 2020-04-08 DIAGNOSIS — Z9049 Acquired absence of other specified parts of digestive tract: Secondary | ICD-10-CM

## 2020-04-08 DIAGNOSIS — M549 Dorsalgia, unspecified: Secondary | ICD-10-CM | POA: Diagnosis present

## 2020-04-08 DIAGNOSIS — E1140 Type 2 diabetes mellitus with diabetic neuropathy, unspecified: Secondary | ICD-10-CM | POA: Diagnosis present

## 2020-04-08 DIAGNOSIS — J449 Chronic obstructive pulmonary disease, unspecified: Secondary | ICD-10-CM | POA: Diagnosis present

## 2020-04-08 DIAGNOSIS — E11621 Type 2 diabetes mellitus with foot ulcer: Secondary | ICD-10-CM | POA: Diagnosis present

## 2020-04-08 DIAGNOSIS — G8929 Other chronic pain: Secondary | ICD-10-CM | POA: Diagnosis present

## 2020-04-08 DIAGNOSIS — K5520 Angiodysplasia of colon without hemorrhage: Secondary | ICD-10-CM | POA: Diagnosis present

## 2020-04-08 DIAGNOSIS — Z87891 Personal history of nicotine dependence: Secondary | ICD-10-CM

## 2020-04-08 DIAGNOSIS — Z8249 Family history of ischemic heart disease and other diseases of the circulatory system: Secondary | ICD-10-CM

## 2020-04-08 DIAGNOSIS — E1136 Type 2 diabetes mellitus with diabetic cataract: Secondary | ICD-10-CM | POA: Diagnosis present

## 2020-04-08 DIAGNOSIS — Z20822 Contact with and (suspected) exposure to covid-19: Secondary | ICD-10-CM | POA: Diagnosis present

## 2020-04-08 DIAGNOSIS — I10 Essential (primary) hypertension: Secondary | ICD-10-CM | POA: Diagnosis present

## 2020-04-08 LAB — COMPREHENSIVE METABOLIC PANEL
ALT: 11 U/L (ref 0–44)
AST: 17 U/L (ref 15–41)
Albumin: 3.5 g/dL (ref 3.5–5.0)
Alkaline Phosphatase: 80 U/L (ref 38–126)
Anion gap: 11 (ref 5–15)
BUN: 18 mg/dL (ref 8–23)
CO2: 24 mmol/L (ref 22–32)
Calcium: 9.6 mg/dL (ref 8.9–10.3)
Chloride: 91 mmol/L — ABNORMAL LOW (ref 98–111)
Creatinine, Ser: 0.96 mg/dL (ref 0.61–1.24)
GFR calc Af Amer: >60 mL/min (ref >60)
GFR calc non Af Amer: >60 mL/min (ref >60)
Glucose, Bld: 79 mg/dL (ref 70–99)
Potassium: 4.8 mmol/L (ref 3.5–5.1)
Sodium: 126 mmol/L — ABNORMAL LOW (ref 135–145)
Total Bilirubin: 0.9 mg/dL (ref 0.3–1.2)
Total Protein: 8.3 g/dL — ABNORMAL HIGH (ref 6.5–8.1)

## 2020-04-08 LAB — CBC WITH DIFFERENTIAL/PLATELET
Abs Immature Granulocytes: 0.03 10*3/uL (ref 0.00–0.07)
Basophils Absolute: 0 10*3/uL (ref 0.0–0.1)
Basophils Relative: 1 %
Eosinophils Absolute: 0 10*3/uL (ref 0.0–0.5)
Eosinophils Relative: 0 %
HCT: 31.1 % — ABNORMAL LOW (ref 39.0–52.0)
Hemoglobin: 9.8 g/dL — ABNORMAL LOW (ref 13.0–17.0)
Immature Granulocytes: 1 %
Lymphocytes Relative: 16 %
Lymphs Abs: 1.1 10*3/uL (ref 0.7–4.0)
MCH: 33.2 pg (ref 26.0–34.0)
MCHC: 31.5 g/dL (ref 30.0–36.0)
MCV: 105.4 fL — ABNORMAL HIGH (ref 80.0–100.0)
Monocytes Absolute: 0.6 10*3/uL (ref 0.1–1.0)
Monocytes Relative: 9 %
Neutro Abs: 4.8 10*3/uL (ref 1.7–7.7)
Neutrophils Relative %: 73 %
Platelets: 336 10*3/uL (ref 150–400)
RBC: 2.95 MIL/uL — ABNORMAL LOW (ref 4.22–5.81)
RDW: 21.1 % — ABNORMAL HIGH (ref 11.5–15.5)
Smear Review: NORMAL
WBC: 6.6 10*3/uL (ref 4.0–10.5)
nRBC: 0 % (ref 0.0–0.2)

## 2020-04-08 NOTE — ED Triage Notes (Signed)
Pt states he noticed that he had a sore on his second toe on his right foot and he saw his PCP who prescribed an antibiotic- pt states this morning that he noticed redness and swelling up to his ankle- right foot is notably swollen and red with a bandage on toe

## 2020-04-08 NOTE — ED Notes (Signed)
Patient to stat desk asking about wait time. Patient given update on wait time. Patient verbalizes understanding.  

## 2020-04-08 NOTE — ED Notes (Signed)
One set of cultures sent with blood work

## 2020-04-09 ENCOUNTER — Inpatient Hospital Stay: Payer: PPO | Admitting: Anesthesiology

## 2020-04-09 ENCOUNTER — Encounter: Payer: Self-pay | Admitting: Internal Medicine

## 2020-04-09 ENCOUNTER — Encounter
Admission: EM | Disposition: A | Discharge status: Home / Self Care | Payer: Self-pay | Source: Home / Self Care | Attending: Hospitalist

## 2020-04-09 ENCOUNTER — Inpatient Hospital Stay
Admission: EM | Admit: 2020-04-09 | Discharge: 2020-04-11 | DRG: 580 | Disposition: A | Discharge status: Home / Self Care | Payer: PPO | Attending: Hospitalist | Admitting: Hospitalist

## 2020-04-09 ENCOUNTER — Emergency Department: Payer: PPO

## 2020-04-09 ENCOUNTER — Inpatient Hospital Stay: Payer: PPO

## 2020-04-09 DIAGNOSIS — G4730 Sleep apnea, unspecified: Secondary | ICD-10-CM | POA: Diagnosis present

## 2020-04-09 DIAGNOSIS — E1136 Type 2 diabetes mellitus with diabetic cataract: Secondary | ICD-10-CM | POA: Diagnosis present

## 2020-04-09 DIAGNOSIS — Z20822 Contact with and (suspected) exposure to covid-19: Secondary | ICD-10-CM | POA: Diagnosis present

## 2020-04-09 DIAGNOSIS — L02611 Cutaneous abscess of right foot: Secondary | ICD-10-CM | POA: Diagnosis not present

## 2020-04-09 DIAGNOSIS — E785 Hyperlipidemia, unspecified: Secondary | ICD-10-CM | POA: Diagnosis present

## 2020-04-09 DIAGNOSIS — M7989 Other specified soft tissue disorders: Secondary | ICD-10-CM | POA: Diagnosis not present

## 2020-04-09 DIAGNOSIS — M2011 Hallux valgus (acquired), right foot: Secondary | ICD-10-CM | POA: Diagnosis not present

## 2020-04-09 DIAGNOSIS — F329 Major depressive disorder, single episode, unspecified: Secondary | ICD-10-CM | POA: Diagnosis present

## 2020-04-09 DIAGNOSIS — K589 Irritable bowel syndrome without diarrhea: Secondary | ICD-10-CM | POA: Diagnosis present

## 2020-04-09 DIAGNOSIS — L03115 Cellulitis of right lower limb: Secondary | ICD-10-CM

## 2020-04-09 DIAGNOSIS — E11621 Type 2 diabetes mellitus with foot ulcer: Secondary | ICD-10-CM | POA: Diagnosis present

## 2020-04-09 DIAGNOSIS — S93114A Dislocation of interphalangeal joint of right lesser toe(s), initial encounter: Secondary | ICD-10-CM | POA: Diagnosis not present

## 2020-04-09 DIAGNOSIS — E871 Hypo-osmolality and hyponatremia: Secondary | ICD-10-CM

## 2020-04-09 DIAGNOSIS — L97512 Non-pressure chronic ulcer of other part of right foot with fat layer exposed: Secondary | ICD-10-CM | POA: Diagnosis not present

## 2020-04-09 DIAGNOSIS — I2510 Atherosclerotic heart disease of native coronary artery without angina pectoris: Secondary | ICD-10-CM | POA: Diagnosis present

## 2020-04-09 DIAGNOSIS — J449 Chronic obstructive pulmonary disease, unspecified: Secondary | ICD-10-CM | POA: Diagnosis present

## 2020-04-09 DIAGNOSIS — J441 Chronic obstructive pulmonary disease with (acute) exacerbation: Secondary | ICD-10-CM | POA: Diagnosis not present

## 2020-04-09 DIAGNOSIS — R600 Localized edema: Secondary | ICD-10-CM | POA: Diagnosis not present

## 2020-04-09 DIAGNOSIS — G8929 Other chronic pain: Secondary | ICD-10-CM | POA: Diagnosis present

## 2020-04-09 DIAGNOSIS — I4892 Unspecified atrial flutter: Secondary | ICD-10-CM | POA: Diagnosis present

## 2020-04-09 DIAGNOSIS — I10 Essential (primary) hypertension: Secondary | ICD-10-CM | POA: Diagnosis present

## 2020-04-09 DIAGNOSIS — M549 Dorsalgia, unspecified: Secondary | ICD-10-CM | POA: Diagnosis present

## 2020-04-09 DIAGNOSIS — M5136 Other intervertebral disc degeneration, lumbar region: Secondary | ICD-10-CM | POA: Diagnosis present

## 2020-04-09 DIAGNOSIS — E1169 Type 2 diabetes mellitus with other specified complication: Secondary | ICD-10-CM | POA: Diagnosis present

## 2020-04-09 DIAGNOSIS — M19071 Primary osteoarthritis, right ankle and foot: Secondary | ICD-10-CM | POA: Diagnosis not present

## 2020-04-09 DIAGNOSIS — E7800 Pure hypercholesterolemia, unspecified: Secondary | ICD-10-CM | POA: Diagnosis present

## 2020-04-09 DIAGNOSIS — K219 Gastro-esophageal reflux disease without esophagitis: Secondary | ICD-10-CM | POA: Diagnosis present

## 2020-04-09 DIAGNOSIS — F419 Anxiety disorder, unspecified: Secondary | ICD-10-CM | POA: Diagnosis present

## 2020-04-09 DIAGNOSIS — D500 Iron deficiency anemia secondary to blood loss (chronic): Secondary | ICD-10-CM | POA: Diagnosis present

## 2020-04-09 DIAGNOSIS — L03031 Cellulitis of right toe: Secondary | ICD-10-CM | POA: Diagnosis present

## 2020-04-09 DIAGNOSIS — L089 Local infection of the skin and subcutaneous tissue, unspecified: Secondary | ICD-10-CM | POA: Diagnosis not present

## 2020-04-09 DIAGNOSIS — E1140 Type 2 diabetes mellitus with diabetic neuropathy, unspecified: Secondary | ICD-10-CM | POA: Diagnosis present

## 2020-04-09 DIAGNOSIS — K31819 Angiodysplasia of stomach and duodenum without bleeding: Secondary | ICD-10-CM | POA: Diagnosis present

## 2020-04-09 HISTORY — PX: AMPUTATION TOE: SHX6595

## 2020-04-09 LAB — LACTIC ACID, PLASMA
Lactic Acid, Venous: 2 mmol/L (ref 0.5–1.9)
Lactic Acid, Venous: 2.2 mmol/L (ref 0.5–1.9)
Lactic Acid, Venous: 1.3 mmol/L (ref 0.5–1.9)

## 2020-04-09 LAB — SARS CORONAVIRUS 2 BY RT PCR (HOSPITAL ORDER, PERFORMED IN ~~LOC~~ HOSPITAL LAB): SARS Coronavirus 2: NEGATIVE

## 2020-04-09 LAB — MRSA PCR SCREENING: MRSA by PCR: NEGATIVE

## 2020-04-09 LAB — FOLATE: Folate: 4.9 ng/mL — ABNORMAL LOW (ref >5.9)

## 2020-04-09 LAB — VITAMIN B12: Vitamin B-12: 1046 pg/mL — ABNORMAL HIGH (ref 180–914)

## 2020-04-09 SURGERY — AMPUTATION, TOE
Anesthesia: General | Site: Toe | Laterality: Right

## 2020-04-09 MED ORDER — FUROSEMIDE 20 MG PO TABS
20.0000 mg | ORAL_TABLET | Freq: Two times a day (BID) | ORAL | Status: DC
  Administered 2020-04-09 – 2020-04-11 (×4): 20 mg via ORAL
  Filled 2020-04-09 (×5): qty 1

## 2020-04-09 MED ORDER — PROPOFOL 10 MG/ML IV BOLUS
INTRAVENOUS | Status: AC
  Filled 2020-04-09: qty 20

## 2020-04-09 MED ORDER — FLUTICASONE-UMECLIDIN-VILANT 100-62.5-25 MCG/INH IN AEPB: 1.0000 | INHALATION_SPRAY | Freq: Every day | RESPIRATORY_TRACT | Status: DC

## 2020-04-09 MED ORDER — ACETAMINOPHEN 10 MG/ML IV SOLN
INTRAVENOUS | Status: AC
  Filled 2020-04-09: qty 100

## 2020-04-09 MED ORDER — ENOXAPARIN SODIUM 40 MG/0.4ML ~~LOC~~ SOLN
40.0000 mg | SUBCUTANEOUS | Status: DC
  Administered 2020-04-09 – 2020-04-11 (×3): 40 mg via SUBCUTANEOUS
  Filled 2020-04-09 (×3): qty 0.4

## 2020-04-09 MED ORDER — LACTATED RINGERS IV SOLN: INTRAVENOUS | Status: DC | PRN

## 2020-04-09 MED ORDER — SODIUM CHLORIDE 0.9 % IV BOLUS
1000.0000 mL | Freq: Once | INTRAVENOUS | Status: AC
  Administered 2020-04-09: 1000 mL via INTRAVENOUS

## 2020-04-09 MED ORDER — ONDANSETRON HCL 4 MG/2ML IJ SOLN: 4.0000 mg | Freq: Four times a day (QID) | INTRAMUSCULAR | Status: DC | PRN

## 2020-04-09 MED ORDER — PROPOFOL 10 MG/ML IV BOLUS
INTRAVENOUS | Status: DC | PRN
  Administered 2020-04-09: 60 mg via INTRAVENOUS

## 2020-04-09 MED ORDER — APIXABAN 5 MG PO TABS
5.0000 mg | ORAL_TABLET | Freq: Two times a day (BID) | ORAL | Status: DC
  Filled 2020-04-09: qty 1

## 2020-04-09 MED ORDER — FENTANYL CITRATE (PF) 100 MCG/2ML IJ SOLN
INTRAMUSCULAR | Status: AC
  Filled 2020-04-09: qty 2

## 2020-04-09 MED ORDER — FENTANYL CITRATE (PF) 100 MCG/2ML IJ SOLN
INTRAMUSCULAR | Status: DC | PRN
Start: 2020-04-09 — End: 2020-04-09
  Administered 2020-04-09 (×2): 50 ug via INTRAVENOUS

## 2020-04-09 MED ORDER — ACETAMINOPHEN 325 MG PO TABS
650.0000 mg | ORAL_TABLET | Freq: Four times a day (QID) | ORAL | Status: DC | PRN
  Filled 2020-04-09: qty 2

## 2020-04-09 MED ORDER — MORPHINE SULFATE (PF) 2 MG/ML IV SOLN
2.0000 mg | INTRAVENOUS | Status: DC | PRN
  Administered 2020-04-10: 2 mg via INTRAVENOUS
  Filled 2020-04-09: qty 1

## 2020-04-09 MED ORDER — HYDRALAZINE HCL 20 MG/ML IJ SOLN
5.0000 mg | Freq: Once | INTRAMUSCULAR | Status: AC
  Administered 2020-04-09: 5 mg via INTRAVENOUS
  Filled 2020-04-09: qty 1

## 2020-04-09 MED ORDER — ONDANSETRON HCL 4 MG/2ML IJ SOLN
INTRAMUSCULAR | Status: AC
  Filled 2020-04-09: qty 2

## 2020-04-09 MED ORDER — VANCOMYCIN HCL IN DEXTROSE 1-5 GM/200ML-% IV SOLN
1000.0000 mg | Freq: Two times a day (BID) | INTRAVENOUS | Status: DC
  Administered 2020-04-09 – 2020-04-11 (×4): 1000 mg via INTRAVENOUS
  Filled 2020-04-09 (×6): qty 200

## 2020-04-09 MED ORDER — OXYCODONE-ACETAMINOPHEN 5-325 MG PO TABS
1.0000 | ORAL_TABLET | ORAL | Status: DC | PRN
  Administered 2020-04-09 – 2020-04-10 (×2): 1 via ORAL
  Filled 2020-04-09 (×2): qty 1

## 2020-04-09 MED ORDER — ONDANSETRON HCL 4 MG PO TABS: 4.0000 mg | ORAL_TABLET | Freq: Four times a day (QID) | ORAL | Status: DC | PRN

## 2020-04-09 MED ORDER — VANCOMYCIN HCL IN DEXTROSE 1-5 GM/200ML-% IV SOLN
1000.0000 mg | Freq: Once | INTRAVENOUS | Status: AC
  Administered 2020-04-09: 1000 mg via INTRAVENOUS
  Filled 2020-04-09: qty 200

## 2020-04-09 MED ORDER — DEXAMETHASONE SODIUM PHOSPHATE 10 MG/ML IJ SOLN
INTRAMUSCULAR | Status: AC
  Filled 2020-04-09: qty 1

## 2020-04-09 MED ORDER — LIDOCAINE HCL (PF) 2 % IJ SOLN
INTRAMUSCULAR | Status: AC
  Filled 2020-04-09: qty 5

## 2020-04-09 MED ORDER — HYDROCODONE-ACETAMINOPHEN 5-325 MG PO TABS: 1.0000 | ORAL_TABLET | ORAL | Status: DC | PRN

## 2020-04-09 MED ORDER — FLUTICASONE FUROATE-VILANTEROL 100-25 MCG/INH IN AEPB
1.0000 | INHALATION_SPRAY | Freq: Every day | RESPIRATORY_TRACT | Status: DC
  Administered 2020-04-09 – 2020-04-11 (×3): 1 via RESPIRATORY_TRACT
  Filled 2020-04-09: qty 28

## 2020-04-09 MED ORDER — PHENYLEPHRINE HCL (PRESSORS) 10 MG/ML IV SOLN
INTRAVENOUS | Status: AC
  Filled 2020-04-09: qty 1

## 2020-04-09 MED ORDER — SERTRALINE HCL 50 MG PO TABS
50.0000 mg | ORAL_TABLET | Freq: Every day | ORAL | Status: DC
  Administered 2020-04-10 – 2020-04-11 (×2): 50 mg via ORAL
  Filled 2020-04-09 (×2): qty 1

## 2020-04-09 MED ORDER — FENTANYL CITRATE (PF) 100 MCG/2ML IJ SOLN: 25.0000 ug | INTRAMUSCULAR | Status: DC | PRN

## 2020-04-09 MED ORDER — ACETAMINOPHEN 650 MG RE SUPP: 650.0000 mg | Freq: Four times a day (QID) | RECTAL | Status: DC | PRN

## 2020-04-09 MED ORDER — SODIUM CHLORIDE 0.9 % IV SOLN
1.0000 g | INTRAVENOUS | Status: DC
  Administered 2020-04-09 – 2020-04-11 (×3): 1 g via INTRAVENOUS
  Filled 2020-04-09: qty 10
  Filled 2020-04-09: qty 1
  Filled 2020-04-09: qty 10

## 2020-04-09 MED ORDER — VANCOMYCIN HCL 1750 MG/350ML IV SOLN
1750.0000 mg | INTRAVENOUS | Status: DC
Start: 2020-04-09 — End: 2020-04-09

## 2020-04-09 MED ORDER — ACETAMINOPHEN 10 MG/ML IV SOLN
INTRAVENOUS | Status: DC | PRN
  Administered 2020-04-09: 1000 mg via INTRAVENOUS

## 2020-04-09 MED ORDER — METOPROLOL TARTRATE 50 MG PO TABS
100.0000 mg | ORAL_TABLET | Freq: Two times a day (BID) | ORAL | Status: DC
  Administered 2020-04-09 – 2020-04-11 (×4): 100 mg via ORAL
  Filled 2020-04-09 (×3): qty 2

## 2020-04-09 MED ORDER — PROPOFOL 500 MG/50ML IV EMUL
INTRAVENOUS | Status: DC | PRN
  Administered 2020-04-09: 150 ug/kg/min via INTRAVENOUS

## 2020-04-09 MED ORDER — UMECLIDINIUM BROMIDE 62.5 MCG/INH IN AEPB
1.0000 | INHALATION_SPRAY | Freq: Every day | RESPIRATORY_TRACT | Status: DC
  Administered 2020-04-09 – 2020-04-11 (×3): 1 via RESPIRATORY_TRACT
  Filled 2020-04-09: qty 7

## 2020-04-09 MED ORDER — ONDANSETRON HCL 4 MG/2ML IJ SOLN: 4.0000 mg | Freq: Once | INTRAMUSCULAR | Status: DC | PRN

## 2020-04-09 MED ORDER — GLYCOPYRROLATE 0.2 MG/ML IJ SOLN
INTRAMUSCULAR | Status: AC
  Filled 2020-04-09: qty 1

## 2020-04-09 MED ORDER — DEXAMETHASONE SODIUM PHOSPHATE 10 MG/ML IJ SOLN
INTRAMUSCULAR | Status: DC | PRN
  Administered 2020-04-09: 5 mg via INTRAVENOUS

## 2020-04-09 MED ORDER — PANTOPRAZOLE SODIUM 40 MG PO TBEC
40.0000 mg | DELAYED_RELEASE_TABLET | Freq: Two times a day (BID) | ORAL | Status: DC
  Administered 2020-04-09 – 2020-04-11 (×4): 40 mg via ORAL
  Filled 2020-04-09 (×4): qty 1

## 2020-04-09 MED ORDER — SUCCINYLCHOLINE CHLORIDE 200 MG/10ML IV SOSY
PREFILLED_SYRINGE | INTRAVENOUS | Status: AC
  Filled 2020-04-09: qty 10

## 2020-04-09 SURGICAL SUPPLY — 49 items
BLADE MED AGGRESSIVE (BLADE) ×3 IMPLANT
BLADE OSC/SAGITTAL MD 5.5X18 (BLADE) ×3 IMPLANT
BLADE SURG 15 STRL LF DISP TIS (BLADE) ×2 IMPLANT
BLADE SURG 15 STRL SS (BLADE) ×6
BLADE SURG MINI STRL (BLADE) ×3 IMPLANT
BNDG CONFORM 2 STRL LF (GAUZE/BANDAGES/DRESSINGS) ×3 IMPLANT
BNDG ELASTIC 4X5.8 VLCR STR LF (GAUZE/BANDAGES/DRESSINGS) ×3 IMPLANT
BNDG ESMARK 4X12 TAN STRL LF (GAUZE/BANDAGES/DRESSINGS) ×3 IMPLANT
BNDG GAUZE 4.5X4.1 6PLY STRL (MISCELLANEOUS) ×3 IMPLANT
CANISTER SUCT 1200ML W/VALVE (MISCELLANEOUS) ×3 IMPLANT
CLOSURE WOUND 1/4X4 (GAUZE/BANDAGES/DRESSINGS) ×1
COVER WAND RF STERILE (DRAPES) ×3 IMPLANT
CUFF TOURN 18 STER (MISCELLANEOUS) ×3 IMPLANT
CUFF TOURN DUAL PL 12 NO SLV (MISCELLANEOUS) ×3 IMPLANT
DRAPE FLUOR MINI C-ARM 54X84 (DRAPES) ×3 IMPLANT
DURAPREP 26ML APPLICATOR (WOUND CARE) ×3 IMPLANT
ELECT REM PT RETURN 9FT ADLT (ELECTROSURGICAL) ×3
ELECTRODE REM PT RTRN 9FT ADLT (ELECTROSURGICAL) ×1 IMPLANT
GAUZE SPONGE 4X4 12PLY STRL (GAUZE/BANDAGES/DRESSINGS) ×3 IMPLANT
GAUZE XEROFORM 1X8 LF (GAUZE/BANDAGES/DRESSINGS) ×3 IMPLANT
GLOVE BIO SURGEON STRL SZ7.5 (GLOVE) ×3 IMPLANT
GLOVE INDICATOR 8.0 STRL GRN (GLOVE) ×3 IMPLANT
GOWN STRL REUS W/ TWL LRG LVL3 (GOWN DISPOSABLE) ×2 IMPLANT
GOWN STRL REUS W/TWL LRG LVL3 (GOWN DISPOSABLE) ×6
HANDPIECE VERSAJET DEBRIDEMENT (MISCELLANEOUS) ×3 IMPLANT
KIT TURNOVER KIT A (KITS) ×3 IMPLANT
LABEL OR SOLS (LABEL) ×3 IMPLANT
NDL FILTER BLUNT 18X1 1/2 (NEEDLE) ×1 IMPLANT
NDL HYPO 25X1 1.5 SAFETY (NEEDLE) ×2 IMPLANT
NEEDLE FILTER BLUNT 18X 1/2SAF (NEEDLE) ×2
NEEDLE FILTER BLUNT 18X1 1/2 (NEEDLE) ×1 IMPLANT
NEEDLE HYPO 25X1 1.5 SAFETY (NEEDLE) ×6 IMPLANT
NS IRRIG 500ML POUR BTL (IV SOLUTION) ×3 IMPLANT
PACK EXTREMITY (MISCELLANEOUS) ×3 IMPLANT
SOL .9 NS 3000ML IRR  AL (IV SOLUTION) ×2
SOL .9 NS 3000ML IRR AL (IV SOLUTION) ×1
SOL .9 NS 3000ML IRR UROMATIC (IV SOLUTION) ×1 IMPLANT
SOL PREP PVP 2OZ (MISCELLANEOUS) ×3
SOLUTION PREP PVP 2OZ (MISCELLANEOUS) ×1 IMPLANT
STOCKINETTE BIAS CUT 4 980044 (GAUZE/BANDAGES/DRESSINGS) ×3 IMPLANT
STOCKINETTE STRL 6IN 960660 (GAUZE/BANDAGES/DRESSINGS) ×3 IMPLANT
STRIP CLOSURE SKIN 1/4X4 (GAUZE/BANDAGES/DRESSINGS) ×2 IMPLANT
SUT ETHILON 3-0 FS-10 30 BLK (SUTURE) ×3
SUT ETHILON 4-0 (SUTURE)
SUT ETHILON 4-0 FS2 18XMFL BLK (SUTURE)
SUTURE EHLN 3-0 FS-10 30 BLK (SUTURE) ×1 IMPLANT
SUTURE ETHLN 4-0 FS2 18XMF BLK (SUTURE) IMPLANT
SWAB DUAL CULTURE TRANS RED ST (MISCELLANEOUS) ×3 IMPLANT
SYR 10ML LL (SYRINGE) ×3 IMPLANT

## 2020-04-09 NOTE — Progress Notes (Signed)
Spoke with wife. Patient ate around 11:30-11:45. Anesthesia notified.

## 2020-04-09 NOTE — ED Provider Notes (Signed)
[facility] Emergency Department Provider Note   ____________________________________________   First MD Initiated Contact with Patient 04/09/20 0131     (approximate)  I have reviewed the triage vital signs and the nursing notes.   HISTORY  Chief Complaint Wound Infection    HPI Troy Lopez is a 79 y.o. male who presents to the ED from home with a chief complaint of right toe/foot infection.  Patient was seen by Dr. Cleda Mccreedy from podiatry on 04/05/2020.  Had ulcer on right second digit debrided, x-rays taken which did not show osteomyelitis at the time; apparent mild lateral dislocation of the middle phalanx of the second toe in relation to the head of the proximal phalanx. Patient was placed on doxycycline.  Reports increased redness and swelling of the toe/foot, and now red streaking towards the ankle and lower leg.  Denies fever, chills, cough, chest pain, shortness of breath, abdominal pain, nausea or vomiting. Patient has had both doses of his COVID-19 vaccination.       Past Medical History:  Diagnosis Date  . Adrenal gland anomaly    enlargement  . Anemia    IDA  . Anxiety   . CAD (coronary artery disease)   . Carpal tunnel syndrome   . Chronic back pain   . Chronic hyponatremia   . Colitis   . Colonic polyp   . COPD (chronic obstructive pulmonary disease) ([place])   . Degenerative disc disease, lumbar   . Depression   . Diverticulosis   . Dyspnea   . Gastric AVM   . GERD (gastroesophageal reflux disease)   . H/O degenerative disc disease   . Hypercholesterolemia   . Hyperkalemia   . Hyperlipidemia   . Hypertension   . Irritable bowel syndrome   . Monoclonal gammopathy   . Monoclonal gammopathy   . Neuropathy   . Personal history of tobacco use, presenting hazards to health 08/17/2015  . Sleep apnea   . Vertebral compression fracture [facility])     Patient Active Problem List   Diagnosis Date Noted  . Cellulitis of right foot  04/09/2020  . Renal lesion 03/12/2020  . Post-nasal drip 11/24/2019  . Skin nodule 09/28/2019  . Scalp lesion 06/20/2019  . Swelling of lower extremity 03/15/2019  . Senile osteoporosis 02/24/2019  . Neuropathy 01/01/2019  . Macrocytic anemia 12/22/2018  . Other fatigue 12/22/2018  . ILD (interstitial lung disease) ([place]) 10/06/2018  . Peripheral edema 06/10/2018  . Lumbar radiculopathy (Right L1/2) 05/21/2018  . Lumbar degenerative disc disease 05/21/2018  . Lumbar foraminal stenosis 05/21/2018  . Chronic pain syndrome 05/21/2018  . Syncope 03/05/2018  . Constipation 10/29/2017  . Fall 10/29/2017  . Rib pain on right side 10/29/2017  . Pulmonary hypertension ([place]) 05/02/2017  . Atrial flutter ([place]) 05/02/2017  . Thoracic aortic aneurysm ([place]) 04/29/2017  . Abnormal feces   . Rectal polyp   . Chest pain 03/03/2017  . Blood in stool   . 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 Alcohol abuse 01/30/2017  . History of compression fracture of spine 12/08/2016  . Moderate COPD (chronic obstructive pulmonary disease) ([place]) 07/22/2016  . COPD exacerbation ([place]) 07/22/2016  . Mild depression ([place]) 07/21/2016  . Skin lesion 01/21/2016  . Nasal congestion 09/28/2015  . Sleep difficulties 09/26/2015  . Personal history of tobacco use, presenting hazards to health 08/17/2015  . Health care maintenance 10/23/2014  . Collagenous colitis 09/16/2014  . Leg pain 03/20/2014  . Hyperlipemia 12/22/2013  .  Shortness of breath 11/21/2013  . Ulcer 02/02/2013  . Chronic back pain 02/02/2013  . GERD (gastroesophageal reflux disease) 09/13/2012  . Sleep apnea 09/13/2012  . Iron deficiency anemia 09/08/2012  . 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 CAD (coronary artery disease) 09/08/2012  . Hypertension 09/08/2012  . Hyponatremia 09/08/2012    Past Surgical History:  Procedure Laterality Date  . BUNIONECTOMY  1989  . CARDIAC CATHETERIZATION    .  CARPAL TUNNEL RELEASE Left 2011   ulnar nerve sub muscular at elbow  . CHOLECYSTECTOMY  09/07  . COLONOSCOPY WITH PROPOFOL N/A 03/05/2017   Procedure: COLONOSCOPY WITH PROPOFOL;  Surgeon: Lucilla Lame, MD;  Location: [facility] ENDOSCOPY;  Service: Endoscopy;  Laterality: N/A;  . ELECTROPHYSIOLOGIC STUDY N/A 11/15/2015   Procedure: CARDIOVERSION;  Surgeon: Isaias Cowman, MD;  Location: [facility] ORS;  Service: Cardiovascular;  Laterality: N/A;  . ESOPHAGOGASTRODUODENOSCOPY (EGD) WITH PROPOFOL N/A 03/05/2017   Procedure: ESOPHAGOGASTRODUODENOSCOPY (EGD) WITH PROPOFOL;  Surgeon: Lucilla Lame, MD;  Location: [facility] ENDOSCOPY;  Service: Endoscopy;  Laterality: N/A;  . ESOPHAGOGASTRODUODENOSCOPY (EGD) WITH PROPOFOL N/A 10/17/2017   Procedure: ESOPHAGOGASTRODUODENOSCOPY (EGD) WITH PROPOFOL;  Surgeon: Lollie Sails, MD;  Location: [facility] ENDOSCOPY;  Service: Endoscopy;  Laterality: N/A;  . EYE SURGERY Bilateral 2010   cataract  . HEMORRHOID SURGERY    . KYPHOPLASTY N/A 11/04/2016   Procedure: KYPHOPLASTY T 12;  Surgeon: Hessie Knows, MD;  Location: [facility] ORS;  Service: Orthopedics;  Laterality: N/A;    Prior to Admission medications   Medication Sig Start Date End Date Taking? Authorizing Provider  acetaminophen (TYLENOL) 500 MG tablet Take 500-1,000 mg by mouth 3 (three) times daily as needed for moderate pain or headache.   Yes [provider]  albuterol (VENTOLIN HFA) 108 (90 Base) MCG/ACT inhaler Inhale 2 puffs into the lungs every 6 (six) hours as needed for wheezing or shortness of breath. 02/14/20  Yes Brand Males, MD  azelastine (ASTELIN) 0.1 % nasal spray Place 1 spray into both nostrils 2 (two) times daily. Use in each nostril as directed 11/24/19  Yes [name], NP  budesonide (ENTOCORT EC) 3 MG 24 hr capsule Take 9 mg by mouth daily as needed (colitis flare).  05/31/15  Yes [provider]  calcium-vitamin D (OSCAL WITH D) 500-200 MG-UNIT tablet Take 2 tablets by mouth  daily.   Yes [provider]  doxycycline (VIBRAMYCIN) 100 MG capsule Take 100 mg by mouth 2 (two) times daily. 04/05/20  Yes [provider]  FIBER PO Take 1 capsule by mouth daily.    Yes [provider]  fluticasone (FLONASE) 50 MCG/ACT nasal spray Place 1 spray into both nostrils daily. 11/24/19  Yes [name], NP  Fluticasone-Umeclidin-Vilant (TRELEGY ELLIPTA) 100-62.5-25 MCG/INH AEPB Inhale 1 puff into the lungs daily. 02/14/20  Yes Brand Males, MD  furosemide (LASIX) 20 MG tablet Take 20 mg by mouth 2 (two) times daily.   Yes [provider]  gabapentin (NEURONTIN) 100 MG capsule :2 Capsule(s) By Mouth Every 3 Hours PRN   Yes [provider]  metoprolol tartrate (LOPRESSOR) 100 MG tablet Take 1 tablet (100 mg total) by mouth 2 (two) times daily. 04/04/20  Yes Einar Pheasant, MD  pantoprazole (PROTONIX) 40 MG tablet Take 1 tablet (40 mg total) by mouth 2 (two) times daily. 03/22/20  Yes Einar Pheasant, MD  sertraline (ZOLOFT) 50 MG tablet Take 1 tablet (50 mg total) by mouth daily. 03/22/20  Yes Einar Pheasant, MD  triamcinolone ointment (KENALOG) 0.1 %  06/24/19  Yes [provider]  vitamin B-12 (CYANOCOBALAMIN) 1000 MCG tablet Take 1,000 mcg by mouth daily.   Yes [provider]  ELIQUIS 5 MG TABS tablet Take 5 mg by mouth 2 (two) times daily.  05/23/16   [provider]  isosorbide mononitrate (IMDUR) 30 MG 24 hr tablet Take 1 tablet (30 mg total) by mouth once daily. Patient not taking: Reported on 03/10/2020 11/27/17   [provider]    Allergies Iodinated diagnostic agents  Family History  Problem Relation Age of Onset  . Stroke Mother   . Heart disease Father        MI - 33   . Colon cancer Neg Hx   . Prostate cancer Neg Hx     Social History Social History   Tobacco Use  . Smoking status: Former Smoker    Packs/day: 2.00    Years: 58.00    Pack years: 116.00    Types: Cigarettes      Start date: 08/19/1957    Quit date: 11/01/2015    Years since quitting: 4.4  . Smokeless tobacco: Never Used  Vaping Use  . Vaping Use: Never used  Substance Use Topics  . Alcohol use: Yes    Alcohol/week: 42.0 standard drinks    Types: 42 Cans of beer per week    Comment: occas  . Drug use: No    Review of Systems  Constitutional: No fever/chills Eyes: No visual changes. ENT: No sore throat. Cardiovascular: Denies chest pain. Respiratory: Denies shortness of breath. Gastrointestinal: No abdominal pain.  No nausea, no vomiting.  No diarrhea.  No constipation. Genitourinary: Negative for dysuria. Musculoskeletal: Positive for worsening redness/swelling of right second toe and foot.  Negative for back pain. Skin: Negative for rash. Neurological: Negative for headaches, focal weakness or numbness.   ____________________________________________   PHYSICAL EXAM:  VITAL SIGNS: ED Triage Vitals  Enc Vitals Group     BP 04/08/20 1208 123/70     Pulse Rate 04/08/20 1208 (!) 57     Resp 04/08/20 1208 18     Temp 04/08/20 1208 97.9 F (36.6 C)     Temp Source 04/08/20 1208 Oral     SpO2 04/08/20 1208 100 %     Weight 04/08/20 1214 177 lb (80.3 kg)     Height 04/08/20 1214 5\' 10"  (1.778 m)     Head Circumference --      Peak Flow --      Pain Score 04/08/20 1214 3     Pain Loc --      Pain Edu? --      Excl. in [place]? --     Constitutional: Alert and oriented. Well appearing and in no acute distress. Eyes: Conjunctivae are normal. PERRL. EOMI. Head: Atraumatic. Nose: No congestion/rhinnorhea. Mouth/Throat: Mucous membranes are moist.  Oropharynx non-erythematous. Neck: No stridor.   Cardiovascular: Normal rate, regular rhythm. Grossly normal heart sounds.  Good peripheral circulation. Respiratory: Normal respiratory effort.  No retractions. Lungs CTAB. Gastrointestinal: Soft and nontender. No distention. No abdominal bruits. No CVA tenderness. Musculoskeletal:  Right  second digit with moderate swelling, warmth and erythema.  Area of prior debridement appears intact.  Entire right foot erythematous and swollen.  Erythematous streaks towards ankle and lower leg.  Palpable distal pulses.  Brisk, less than 5-second capillary refill. Neurologic:  Normal speech and language. No gross focal neurologic deficits are appreciated.  Skin:  Skin is warm, dry  and intact. No rash noted. Psychiatric: Mood and affect are normal. Speech and behavior are normal.  ____________________________________________   LABS (all labs ordered are listed, but only abnormal results are displayed)  Labs Reviewed  LACTIC ACID, PLASMA - Abnormal; Notable for the following components:      Result Value   Lactic Acid, Venous 2.0 (*)    All other components within normal limits  LACTIC ACID, PLASMA - Abnormal; Notable for the following components:   Lactic Acid, Venous 2.2 (*)    All other components within normal limits  COMPREHENSIVE METABOLIC PANEL - Abnormal; Notable for the following components:   Sodium 126 (*)    Chloride 91 (*)    Total Protein 8.3 (*)    All other components within normal limits  CBC WITH DIFFERENTIAL/PLATELET - Abnormal; Notable for the following components:   RBC 2.95 (*)    Hemoglobin 9.8 (*)    HCT 31.1 (*)    MCV 105.4 (*)    RDW 21.1 (*)    All other components within normal limits  SARS CORONAVIRUS 2 BY RT PCR (HOSPITAL ORDER, [facility])  CULTURE, BLOOD (ROUTINE X 2)  CULTURE, BLOOD (ROUTINE X 2)   ____________________________________________  EKG  None ____________________________________________  RADIOLOGY  ED MD interpretation: No osteomyelitis  Official radiology report(s): DG Foot Complete Right  Result Date: 04/09/2020 CLINICAL DATA:  Foot swelling, evaluate for osteomyelitis EXAM: RIGHT FOOT COMPLETE - 3+ VIEW COMPARISON:  Radiograph 10/09/2017 FINDINGS: Diffuse circumferential swelling of the foot.  Questionable ulceration along the plantar and lateral aspect adjacent the head of the fifth metatarsal. No other radiographically evident sites of ulceration are evident. No soft tissue gas or foreign body is seen. No osseous erosion, destructive change or periostitis to suggest osteomyelitis at this location or elsewhere within the foot at this time. There are posttraumatic deformities of the first metatarsal and second metatarsal base. There is a volar dislocation of the second proximal interphalangeal joint. 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 Hallux valgus deformity with chronic soft tissue thickening along the medial head of the first metatarsal. Vascular calcium in the soft tissues. IMPRESSION: 1. Questionable ulceration along the plantar and lateral aspect adjacent the head of the fifth metatarsal. Correlate with visual inspection. 2. No other radiographically evident sites of ulceration within the foot at this time. 3. No radiographic evidence of osteomyelitis. If there is persisting clinical concern, MRI imaging is much more sensitive and specific for early features of osteomyelitis. 4. 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 5. Volar dislocation of the second proximal interphalangeal joint, new from prior. 6. Remote posttraumatic deformity of the first and second metatarsals. 7. Hallux valgus. Electronically Signed   By: Lovena Le M.D.   On: 04/09/2020 02:14    ____________________________________________   PROCEDURES  Procedure(s) performed (including Critical Care):  Procedures   ____________________________________________   INITIAL IMPRESSION / ASSESSMENT AND PLAN / ED COURSE  As part of my medical decision making, I reviewed the following data within the [place] notes reviewed and incorporated, Labs reviewed,  Old chart reviewed, Radiograph reviewed, Discussed with admitting physician and Notes from prior ED visits     Troy Lopez was evaluated in Emergency Department on 04/09/2020 for the symptoms described in the history of present illness. He was evaluated in the context  of the global COVID-19 pandemic, which necessitated consideration that the patient might be at risk for infection with the SARS-CoV-2 virus that causes COVID-19. Institutional protocols and algorithms that pertain to the evaluation of patients at risk for COVID-19 are in a state of rapid change based on information released by regulatory bodies including the CDC and federal and state organizations. These policies and algorithms were followed during the patient's care in the ED.    79 year old male presenting with worsening right second digit cellulitis despite taking doxycycline x3 to 4 days.  Differential diagnosis includes but is not limited to abscess, cellulitis, osteomyelitis, sepsis, etc.  WBC unremarkable.  Patient is hyponatremic which is slightly improved from 2 months ago.  Will check blood cultures, lactic acid, x-ray foot to evaluate for osteomyelitis.  Will start IV vancomycin and discuss with hospitalist services for admission.      ____________________________________________   FINAL CLINICAL IMPRESSION(S) / ED DIAGNOSES  Final diagnoses:  Cellulitis of right lower extremity  Hyponatremia     ED Discharge Orders    None       Note:  This document was prepared using Dragon voice recognition software and may include unintentional dictation errors.   Paulette Blanch, MD 04/09/20 [phone]

## 2020-04-09 NOTE — Consult Note (Addendum)
Reason for Consult: Cellulitis with ulceration right second toe suspicious for osteomyelitis. Referring Physician: Otilio Lopez Troy Lopez is an 79 y.o. male.  HPI: 79 y.o. male with a recent history of ulceration and cellulitis in his right second toe.  Patient was seen outpatient and also noted to have a partial dislocation of his second toe.  Placed on antibiotics.  Failed outpatient antibiotic treatment with increased cellulitis and presented to the emergency department for evaluation.  He does relate an injury a couple of months ago that may have been the timing for the dislocation.   Past Medical History:  Diagnosis Date  . Adrenal gland anomaly    enlargement  . Anemia    IDA  . Anxiety   . CAD (coronary artery disease)   . Carpal tunnel syndrome   . Chronic back pain   . Chronic hyponatremia   . Colitis   . Colonic polyp   . COPD (chronic obstructive pulmonary disease) ([place])   . Degenerative disc disease, lumbar   . Depression   . Diverticulosis   . Dyspnea   . Gastric AVM   . GERD (gastroesophageal reflux disease)   . H/O degenerative disc disease   . Hypercholesterolemia   . Hyperkalemia   . Hyperlipidemia   . Hypertension   . Irritable bowel syndrome   . Monoclonal gammopathy   . Monoclonal gammopathy   . Neuropathy   . Personal history of tobacco use, presenting hazards to health 08/17/2015  . Sleep apnea   . Vertebral compression fracture Troy Lopez)     Past Surgical History:  Procedure Laterality Date  . BUNIONECTOMY  1989  . CARDIAC CATHETERIZATION    . CARPAL TUNNEL RELEASE Left 2011   ulnar nerve sub muscular at elbow  . CHOLECYSTECTOMY  09/07  . COLONOSCOPY WITH PROPOFOL N/A 03/05/2017   Procedure: COLONOSCOPY WITH PROPOFOL;  Surgeon: Lucilla Lame, MD;  Location: [facility] ENDOSCOPY;  Service: Endoscopy;  Laterality: N/A;  . ELECTROPHYSIOLOGIC STUDY N/A 11/15/2015   Procedure: CARDIOVERSION;  Surgeon: Isaias Cowman, MD;  Location: [facility] ORS;  Service:  Cardiovascular;  Laterality: N/A;  . ESOPHAGOGASTRODUODENOSCOPY (EGD) WITH PROPOFOL N/A 03/05/2017   Procedure: ESOPHAGOGASTRODUODENOSCOPY (EGD) WITH PROPOFOL;  Surgeon: Lucilla Lame, MD;  Location: [facility] ENDOSCOPY;  Service: Endoscopy;  Laterality: N/A;  . ESOPHAGOGASTRODUODENOSCOPY (EGD) WITH PROPOFOL N/A 10/17/2017   Procedure: ESOPHAGOGASTRODUODENOSCOPY (EGD) WITH PROPOFOL;  Surgeon: Lollie Sails, MD;  Location: [facility] ENDOSCOPY;  Service: Endoscopy;  Laterality: N/A;  . EYE SURGERY Bilateral 2010   cataract  . HEMORRHOID SURGERY    . KYPHOPLASTY N/A 11/04/2016   Procedure: KYPHOPLASTY T 12;  Surgeon: Hessie Knows, MD;  Location: [facility] ORS;  Service: Orthopedics;  Laterality: N/A;    Family History  Problem Relation Age of Onset  . Stroke Mother   . Heart disease Father        MI - 68   . Colon cancer Neg Hx   . Prostate cancer Neg Hx     Social History:  reports that he quit smoking about 4 years ago. His smoking use included cigarettes. He started smoking about 62 years ago. He has a 116.00 pack-year smoking history. He has never used smokeless tobacco. He reports current alcohol use of about 42.0 standard drinks of alcohol per week. He reports that he does not use drugs.  Allergies:  Allergies  Allergen Reactions  . 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 enoxaparin (LOVENOX) injection  40 mg Subcutaneous Q24H    Results for orders placed or performed during the Lopez encounter of 04/09/20 (from the past 48 hour(s))  Lactic acid, plasma     Status: Abnormal   Collection Time: 04/08/20  1:47 AM  Result Value Ref Range   Lactic Acid, Venous 2.0 (HH) 0.5 - 1.9 mmol/L    Comment: CRITICAL RESULT CALLED TO, READ BACK BY AND VERIFIED WITH ASHTON PETERS ON 04/09/20 AT 0223 BY  JAG Performed at [facility], [address] [place], [place] [address]   Lactic acid, plasma     Status: Abnormal   Collection Time: 04/08/20  3:14 AM  Result Value Ref Range   Lactic Acid, Venous 2.2 (HH) 0.5 - 1.9 mmol/L    Comment: CRITICAL VALUE NOTED. VALUE IS CONSISTENT WITH PREVIOUSLY REPORTED/CALLED VALUE / JAG Performed at [facility], [address] [place], [place] [address]   Culture, blood (routine x 2)     Status: None (Preliminary result)   Collection Time: 04/08/20 12:20 PM   Specimen: BLOOD  Result Value Ref Range   Specimen Description BLOOD BLOOD LEFT FOREARM    Special Requests      BOTTLES DRAWN AEROBIC AND ANAEROBIC Blood Culture adequate volume   Culture      NO GROWTH < 12 HOURS Performed at [facility], [address], [place], [place] [address]    Report Status PENDING   Comprehensive metabolic panel     Status: Abnormal   Collection Time: 04/08/20 12:24 PM  Result Value Ref Range   Sodium 126 (L) 135 - 145 mmol/L   Potassium 4.8 3.5 - 5.1 mmol/L   Chloride 91 (L) 98 - 111 mmol/L   CO2 24 22 - 32 mmol/L   Glucose, Bld 79 70 - 99 mg/dL    Comment: Glucose reference range applies only to samples taken after fasting for at least 8 hours.   BUN 18 8 - 23 mg/dL   Creatinine, Ser 0.96 0.61 - 1.24 mg/dL   Calcium 9.6 8.9 - 10.3 mg/dL   Total Protein 8.3 (H) 6.5 - 8.1 g/dL   Albumin 3.5 3.5 - 5.0 g/dL   AST 17 15 - 41 U/L   ALT 11 0 - 44 U/L   Alkaline Phosphatase 80 38 - 126 U/L   Total Bilirubin 0.9 0.3 - 1.2 mg/dL   GFR calc non Af Amer >60 >60 mL/min   GFR calc Af Amer >60 >60 mL/min   Anion gap 11 5 - 15    Comment: Performed at [facility], [address] [place], [place] [address]  CBC with Differential     Status: Abnormal   Collection Time: 04/08/20 12:24 PM  Result Value Ref Range   WBC 6.6 4.0 - 10.5 K/uL   RBC 2.95 (L) 4.22 - 5.81 MIL/uL   Hemoglobin 9.8 (L) 13.0 - 17.0 g/dL   HCT 31.1 (L)  39 - 52 %   MCV 105.4 (H) 80.0 - 100.0 fL   MCH 33.2 26.0 - 34.0 pg   MCHC 31.5 30.0 - 36.0 g/dL   RDW 21.1 (H) 11.5 - 15.5 %   Platelets 336 150 - 400 K/uL   nRBC 0.0 0.0 - 0.2 %   Neutrophils Relative % 73 %   Neutro Abs 4.8 1.7 - 7.7 K/uL   Lymphocytes Relative  16 %   Lymphs Abs 1.1 0.7 - 4.0 K/uL   Monocytes Relative 9 %   Monocytes Absolute 0.6 0 - 1 K/uL   Eosinophils Relative 0 %   Eosinophils Absolute 0.0 0 - 0 K/uL   Basophils Relative 1 %   Basophils Absolute 0.0 0 - 0 K/uL   WBC Morphology MORPHOLOGY UNREMARKABLE    RBC Morphology MIXED RBC POPULATION    Smear Review Normal platelet morphology    Immature Granulocytes 1 %   Abs Immature Granulocytes 0.03 0.00 - 0.07 K/uL    Comment: Performed at [facility], [address], [place], [place] [address]  Folate     Status: Abnormal   Collection Time: 04/08/20 12:24 PM  Result Value Ref Range   Folate 4.9 (L) >5.9 ng/mL    Comment: Performed at [facility], [address] [place], [place] [address]  SARS Coronavirus 2 by RT PCR (Lopez order, performed in [facility] lab) Nasopharyngeal Nasopharyngeal Swab     Status: None   Collection Time: 04/09/20  1:38 AM   Specimen: Nasopharyngeal Swab  Result Value Ref Range   SARS Coronavirus 2 NEGATIVE NEGATIVE    Comment: (NOTE) SARS-CoV-2 target nucleic acids are NOT DETECTED.  The SARS-CoV-2 RNA is generally detectable in upper and lower respiratory specimens during the acute phase of infection. The lowest concentration of SARS-CoV-2 viral copies this assay can detect is 250 copies / mL. A negative result does not preclude SARS-CoV-2 infection and should not be used as the sole basis for treatment or other patient management decisions.  A negative result may occur with improper specimen collection / handling, submission of specimen other than nasopharyngeal swab, presence of viral mutation(s) within the areas targeted by this assay, and  inadequate number of viral copies (<250 copies / mL). A negative result must be combined with clinical observations, patient history, and epidemiological information.  Fact Sheet for Patients:   StrictlyIdeas.no  Fact Sheet for Healthcare Providers: BankingDealers.co.za  This test is not yet approved or  cleared by the [place] FDA and has been authorized for detection and/or diagnosis of SARS-CoV-2 by FDA under an Emergency Use Authorization (EUA).  This EUA will remain in effect (meaning this test can be used) for the duration of the COVID-19 declaration under Section 564(b)(1) of the Act, 21 U.S.C. section 360bbb-3(b)(1), unless the authorization is terminated or revoked sooner.  Performed at [facility], [address] [place], [place] [address]   Culture, blood (routine x 2)     Status: None (Preliminary result)   Collection Time: 04/09/20  1:47 AM   Specimen: BLOOD  Result Value Ref Range   Specimen Description BLOOD BLOOD RIGHT FOREARM    Special Requests      BOTTLES DRAWN AEROBIC AND ANAEROBIC Blood Culture results may not be optimal due to an excessive volume of blood received in culture bottles   Culture      NO GROWTH < 12 HOURS Performed at [facility], [address] [place], [place] [address]    Report Status PENDING   Lactic acid, plasma     Status: None   Collection Time: 04/09/20  6:44 AM  Result Value Ref Range   Lactic Acid, Venous 1.3 0.5 - 1.9 mmol/L    Comment: Performed at [facility], [address], [place], [place] [address]    MR FOOT RIGHT WO CONTRAST  Result Date: 04/09/2020 CLINICAL DATA:  History of a  skin ulceration on the right second toe. The patient is status post debridement of the wound 04/05/2020. Continued redness, pain and swelling. EXAM: MRI OF THE RIGHT FOREFOOT WITHOUT CONTRAST TECHNIQUE: Multiplanar, multisequence MR imaging of the right forefoot  was performed. No intravenous contrast was administered. COMPARISON:  Plain films right foot 04/09/2020. FINDINGS: Bones/Joint/Cartilage There is no marrow edema to suggest osteomyelitis. The patient has remote healed fractures of the distal first and proximal second metatarsals. No acute fracture is identified. The PIP joint of the second toe is laterally subluxed as seen on the prior plain films. Hallux valgus deformity is noted. 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 Ligaments The medial collateral ligament of the PIP joint of the second toe is difficult to visualize and may be torn. Otherwise negative. Muscles and Tendons No intramuscular fluid collection, tear or strain. Fatty atrophy of intrinsic musculature the foot noted. Soft tissues No focal fluid collection. Subcutaneous edema is seen in the dorsum of the foot. IMPRESSION: Negative for osteomyelitis, abscess or septic joint. Lateral subluxation PIP joint of the second toe. The medial collateral ligament of the PIP joint is not visible and may be torn. Remote healed first and second metatarsal fractures. Hallux valgus and first MTP osteoarthritis. Subcutaneous edema in the dorsum of the foot could be due to dependent change or cellulitis. Electronically Signed   By: Inge Rise M.D.   On: 04/09/2020 14:21   DG Foot Complete Right  Result Date: 04/09/2020 CLINICAL DATA:  Foot swelling, evaluate for osteomyelitis EXAM: RIGHT FOOT COMPLETE - 3+ VIEW COMPARISON:  Radiograph 10/09/2017 FINDINGS: Diffuse circumferential swelling of the foot. Questionable ulceration along the plantar and lateral aspect adjacent the head of the fifth metatarsal. No other radiographically evident sites of ulceration are evident. No soft tissue gas or foreign body is seen. No osseous erosion, destructive change or periostitis to suggest osteomyelitis at this location or elsewhere within the foot at this time. There are posttraumatic deformities of the first metatarsal and  second metatarsal base. There is a volar dislocation of the second proximal interphalangeal joint. 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 Hallux valgus deformity with chronic soft tissue thickening along the medial head of the first metatarsal. Vascular calcium in the soft tissues. IMPRESSION: 1. Questionable ulceration along the plantar and lateral aspect adjacent the head of the fifth metatarsal. Correlate with visual inspection. 2. No other radiographically evident sites of ulceration within the foot at this time. 3. No radiographic evidence of osteomyelitis. If there is persisting clinical concern, MRI imaging is much more sensitive and specific for early features of osteomyelitis. 4. 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 5. Volar dislocation of the second proximal interphalangeal joint, new from prior. 6. Remote posttraumatic deformity of the first and second metatarsals. 7. Hallux valgus. Electronically Signed   By: Lovena Le M.D.   On: 04/09/2020 02:14    Review of Systems  Constitutional: Negative for chills and fever.  HENT: Negative for sinus pain and sore throat.   Respiratory: Negative for cough and shortness of breath.   Cardiovascular: Negative for chest pain and palpitations.  Gastrointestinal: Negative for nausea and vomiting.  Endocrine: Negative for polydipsia and polyuria.  Genitourinary: Negative for frequency and urgency.  Musculoskeletal:       Wife relates some increased contracture in his right second toe.  Neurological:  Patient does have numbness associated with his diabetes.  Psychiatric/Behavioral: Negative for confusion. The patient is not nervous/anxious.    Blood pressure (!) 157/81, pulse 69, temperature (!) 97.5 F (36.4 C), resp. rate 13, height 5\' 10"  (1.778 m), weight 80.3 kg, SpO2 100 %. Physical  Exam Cardiovascular:     Comments: DP pulses are palpable.  PT pulse palpable but diminished on the right. Musculoskeletal:     Comments: Adequate range of motion of the pedal joints.  Upon palpation there is clinically some dislocation of the middle and distal phalanx onto the head of the proximal phalanx of the second toe.  This was also noted on x-ray and increased dislocation noted as compared to previous films taken 4 days ago in the office.  Skin:    Comments: The skin is warm dry and mildly atrophic.  Significant erythema and edema in the entire right foot as compared to the left.  Significant focal erythema and edema in the right second toe with a full-thickness draining ulceration along the medial aspect of the toe at the PIPJ area.  Neurological:     Comments: Loss of protective threshold with a monofilament wire distally especially in the great toes.     Assessment/Plan: Assessment: 1.  Dislocation right second toe PIPJ. 2.  Neuropathy. 3.  Full-thickness ulceration with significant cellulitis right second toe and foot.  Plan: Discussed with the patient and his wife that even though the MRI did not clearly show evidence of osteomyelitis that with the full-thickness ulceration and drainage from the toe he clearly has a septic joint situation and most likely has some early osteomyelitis in the second toe.  Discussed with the patient that at this point I think his best recommendation would be for amputation of the right second toe as I do not think this could be fixed or corrected.  Discussed possible risks and complications of the procedure including but not limited to continued infection as well as nonhealing due to his diabetes which could necessitate further surgery.  Questions invited and answered.  We will obtain consent for amputation right second toe.  Patient is n.p.o. now.  Plan for surgery later this evening.  Troy Lopez 04/09/2020, 3:49 PM

## 2020-04-09 NOTE — Anesthesia Preprocedure Evaluation (Signed)
Anesthesia Evaluation  Patient identified by MRN, date of birth, ID band Patient awake    Reviewed: Allergy & Precautions, H&P , NPO status , Patient's Chart, lab work & pertinent test results, reviewed documented beta blocker date and time   Airway Mallampati: II   Neck ROM: full    Dental  (+) Poor Dentition   Pulmonary neg pulmonary ROS, shortness of breath and with exertion, sleep apnea and Continuous Positive Airway Pressure Ventilation , COPD,  COPD inhaler, former smoker,    Pulmonary exam normal        Cardiovascular Exercise Tolerance: Poor hypertension, (-) angina+ CAD and + Peripheral Vascular Disease  Normal cardiovascular exam+ dysrhythmias Atrial Fibrillation  Rhythm:regular Rate:Normal  Hx of a flutter   Neuro/Psych PSYCHIATRIC DISORDERS Anxiety Depression  Neuromuscular disease negative neurological ROS  negative psych ROS   GI/Hepatic negative GI ROS, Neg liver ROS, GERD  ,IBS and gastric AVM   Endo/Other  negative endocrine ROS  Renal/GU negative Renal ROS  negative genitourinary   Musculoskeletal  (+) Arthritis , Osteoarthritis,    Abdominal   Peds  Hematology negative hematology ROS (+) anemia ,   Anesthesia Other Findings Past Medical History: 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 No date: Anxiety No date: CAD (coronary artery disease) No date: Carpal tunnel syndrome No date: Chronic hyponatremia No date: Colitis No date: Colonic polyp No date: COPD (chronic obstructive pulmonary disease) ([facility]) No date: Degenerative disc disease, lumbar No date: Depression No date: Diverticulosis No date: Dyspnea No date: GERD (gastroesophageal reflux disease) No date: H/O degenerative disc disease No date: Hypercholesterolemia No date: Hyperkalemia No date: Hyperlipidemia No date: Hypertension No date: Irritable bowel syndrome 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 No date: Monoclonal  gammopathy No date: Neuropathy 08/17/2015: Personal history of tobacco use, presenting hazards to  health No date: Sleep apnea No date: Vertebral compression fracture [facility]) Past Surgical History: 1989: BUNIONECTOMY 2011: CARPAL TUNNEL RELEASE; Left     Comment:  ulnar nerve sub muscular at elbow 09/07: CHOLECYSTECTOMY 03/05/2017: COLONOSCOPY WITH PROPOFOL; N/A     Comment:  Procedure: COLONOSCOPY WITH PROPOFOL;  Surgeon: Lucilla Lame, MD;  Location: [facility] ENDOSCOPY;  Service:               Endoscopy;  Laterality: N/A; 11/15/2015: ELECTROPHYSIOLOGIC STUDY; N/A     Comment:  Procedure: CARDIOVERSION;  Surgeon: Isaias Cowman,              MD;  Location: [facility] ORS;  Service: Cardiovascular;                Laterality: N/A; 03/05/2017: ESOPHAGOGASTRODUODENOSCOPY (EGD) WITH PROPOFOL; N/A     Comment:  Procedure: ESOPHAGOGASTRODUODENOSCOPY (EGD) WITH               PROPOFOL;  Surgeon: Lucilla Lame, MD;  Location: [facility]               ENDOSCOPY;  Service: Endoscopy;  Laterality: N/A; 2010: EYE SURGERY; Bilateral     Comment:  cataract No date: HEMORRHOID SURGERY 11/04/2016: KYPHOPLASTY; N/A     Comment:  Procedure: KYPHOPLASTY T 12;  Surgeon: Hessie Knows, MD;              Location: [facility] ORS;  Service: Orthopedics;  Laterality:               N/A;   Reproductive/Obstetrics negative OB ROS  Anesthesia Physical  Anesthesia Plan  ASA: III  Anesthesia Plan: General   Post-op Pain Management:    Induction:   PONV Risk Score and Plan: Propofol infusion  Airway Management Planned: Nasal Cannula  Additional Equipment:   Intra-op Plan:   Post-operative Plan:   Informed Consent: I have reviewed the patients History and Physical, chart, labs and discussed the procedure including the risks, benefits and alternatives for the proposed anesthesia with the patient or authorized representative who has indicated his/her  understanding and acceptance.     Dental Advisory Given  Plan Discussed with: CRNA  Anesthesia Plan Comments:         Anesthesia Quick Evaluation

## 2020-04-09 NOTE — Progress Notes (Signed)
PROGRESS NOTE    Troy Lopez  MGQ:[id number] DOB: 09/16/1940 DOA: 04/09/2020 PCP: Einar Pheasant, MD    Assessment & Plan:   Principal Problem:   Cellulitis of right foot Active Problems:   CAD (coronary artery disease)   Hypertension   Moderate COPD (chronic obstructive pulmonary disease) ([place])   Atrial flutter (HCC)    Troy Lopez is a 79 y.o. male with medical history significant for CAD, paroxysmal a flutter, COPD, who presented to podiatry on 04/05/2020 with an ulcer of the right second toe which was debrided and he was sent home on doxycycline who presents to the emergency room with complaints of increased redness and swelling of the toe now with pain swelling and redness of the right foot no advance into the ankle and lower leg.  He denies fever or chills.   Cellulitis of right foot following debridement right second toe ulcer Dislocation right second toe PIPJ  -Patient was seen by podiatry on 04/05/2020 when he underwent debridement.  Patient now with cellulitis second toe now extending to right foot not responding to outpatient doxycycline -Lactic acid was 2 but otherwise normal WBC, afebrile and not meeting sepsis criteria -No evidence of osteomyelitis on x-ray  -started on IV Rocephin and vancomycin on presentation PLAN: --MRI right foot today --Podiatry consult today, plan to take pt for right 2nd toe amputation today --continue vanc and ceftriaxone    CAD (coronary artery disease) -resume home metop    Hypertension -resume home metop and lasix    Moderate COPD (chronic obstructive pulmonary disease) ([place]) -resume home bronchodilator as Breo and Incruse    Atrial flutter ([place]) -Patient was off eliquis for an upcoming GI procedure PLAN: --resume home metop --continue to hold eliquis   DVT prophylaxis: Lovenox SQ Code Status: Full code  Family Communication:  Status is: inpatient Dispo:   The patient is from: home Anticipated d/c is to:  home Anticipated d/c date is: 1-2 days Patient currently is not medically stable to d/c due to: awaiting toe amputation    Subjective and Interval History:  No fever, dyspnea, chest pain, abdominal pain, N/V, dysuria.  Has occasional diarrhea that's chronic.    Objective: Vitals:   04/09/20 0315 04/09/20 0430 04/09/20 0646 04/09/20 1611  BP:  (!) 158/82 (!) 157/81 (!) 198/92  Pulse: 65 67 69 86  Resp: 13 12 13 16   Temp:    97.8 F (36.6 C)  TempSrc:      SpO2: 99% 96% 100% 100%  Weight:      Height:        Intake/Output Summary (Last 24 hours) at 04/09/2020 1721 Last data filed at 04/09/2020 1624 Gross per 24 hour  Intake 1099.08 ml  Output 575 ml  Net 524.08 ml   Filed Weights   04/08/20 1214  Weight: 80.3 kg    Examination:   Constitutional: NAD, AAOx3 HEENT: conjunctivae and lids normal, EOMI CV: RRR no M,R,G. Distal pulses +2.  No cyanosis.   RESP: CTA B/L, normal respiratory effort  GI: +BS, NTND Extremities: No effusions, edema, or tenderness in lower legs.  Erythema and edema in right foot and toes (see pic) SKIN: warm, dry  Neuro: II - XII grossly intact.  Sensation intact Psych: Normal mood and affect.  Appropriate judgement and reason      Data Reviewed: I have personally reviewed following labs and imaging studies  CBC: Recent Labs  Lab 04/07/20 1345 04/08/20 1224  WBC 7.5 6.6  NEUTROABS 5.3  4.8  HGB 9.6* 9.8*  HCT 29.4* 31.1*  MCV 101.7* 105.4*  PLT 321 678   Basic Metabolic Panel: Recent Labs  Lab 04/08/20 1224  NA 126*  K 4.8  CL 91*  CO2 24  GLUCOSE 79  BUN 18  CREATININE 0.96  CALCIUM 9.6   GFR: Estimated Creatinine Clearance: 65.5 mL/min (by C-G formula based on SCr of 0.96 mg/dL). Liver Function Tests: Recent Labs  Lab 04/08/20 1224  AST 17  ALT 11  ALKPHOS 80  BILITOT 0.9  PROT 8.3*  ALBUMIN 3.5   No results for input(s): LIPASE, AMYLASE in the last 168 hours. No results for input(s): AMMONIA in the last 168  hours. Coagulation Profile: No results for input(s): INR, PROTIME in the last 168 hours. Cardiac Enzymes: No results for input(s): CKTOTAL, CKMB, CKMBINDEX, TROPONINI in the last 168 hours. BNP (last 3 results) No results for input(s): PROBNP in the last 8760 hours. HbA1C: No results for input(s): HGBA1C in the last 72 hours. CBG: No results for input(s): GLUCAP in the last 168 hours. Lipid Profile: No results for input(s): CHOL, HDL, LDLCALC, TRIG, CHOLHDL, LDLDIRECT in the last 72 hours. Thyroid Function Tests: No results for input(s): TSH, T4TOTAL, FREET4, T3FREE, THYROIDAB in the last 72 hours. Anemia Panel: Recent Labs    04/07/20 1345 04/08/20 1224  VITAMINB12  --  1,046*  FOLATE  --  4.9*  FERRITIN 69  --   TIBC 910*  --   IRON 101  --    Sepsis Labs: Recent Labs  Lab 04/08/20 0147 04/08/20 0314 04/09/20 0644  LATICACIDVEN 2.0* 2.2* 1.3    Recent Results (from the past 240 hour(s))  SARS CORONAVIRUS 2 (TAT 6-24 HRS) Nasopharyngeal Nasopharyngeal Swab     Status: None   Collection Time: 04/07/20 10:50 AM   Specimen: Nasopharyngeal Swab  Result Value Ref Range Status   SARS Coronavirus 2 NEGATIVE NEGATIVE Final    Comment: (NOTE) SARS-CoV-2 target nucleic acids are NOT DETECTED.  The SARS-CoV-2 RNA is generally detectable in upper and lower respiratory specimens during the acute phase of infection. Negative results do not preclude SARS-CoV-2 infection, do not rule out co-infections with other pathogens, and should not be used as the sole basis for treatment or other patient management decisions. Negative results must be combined with clinical observations, patient history, and epidemiological information. The expected result is Negative.  Fact Sheet for Patients: SugarRoll.be  Fact Sheet for Healthcare Providers: https://www.woods-mathews.com/  This test is not yet approved or cleared by the [place] FDA and    has been authorized for detection and/or diagnosis of SARS-CoV-2 by FDA under an Emergency Use Authorization (EUA). This EUA will remain  in effect (meaning this test can be used) for the duration of the COVID-19 declaration under Se ction 564(b)(1) of the Act, 21 U.S.C. section 360bbb-3(b)(1), unless the authorization is terminated or revoked sooner.  Performed at [facility], [place] [address], [place], [place] [address]   Culture, blood (routine x 2)     Status: None (Preliminary result)   Collection Time: 04/08/20 12:20 PM   Specimen: BLOOD  Result Value Ref Range Status   Specimen Description BLOOD BLOOD LEFT FOREARM  Final   Special Requests   Final    BOTTLES DRAWN AEROBIC AND ANAEROBIC Blood Culture adequate volume   Culture   Final    NO GROWTH < 12 HOURS Performed at [facility], [address], [place], [place] [address]  Report Status PENDING  Incomplete  SARS Coronavirus 2 by RT PCR (hospital order, performed in [facility] hospital lab) Nasopharyngeal Nasopharyngeal Swab     Status: None   Collection Time: 04/09/20  1:38 AM   Specimen: Nasopharyngeal Swab  Result Value Ref Range Status   SARS Coronavirus 2 NEGATIVE NEGATIVE Final    Comment: (NOTE) SARS-CoV-2 target nucleic acids are NOT DETECTED.  The SARS-CoV-2 RNA is generally detectable in upper and lower respiratory specimens during the acute phase of infection. The lowest concentration of SARS-CoV-2 viral copies this assay can detect is 250 copies / mL. A negative result does not preclude SARS-CoV-2 infection and should not be used as the sole basis for treatment or other patient management decisions.  A negative result may occur with improper specimen collection / handling, submission of specimen other than nasopharyngeal swab, presence of viral mutation(s) within the areas targeted by this assay, and inadequate number of viral copies (<250 copies / mL). A negative result must be combined  with clinical observations, patient history, and epidemiological information.  Fact Sheet for Patients:   StrictlyIdeas.no  Fact Sheet for Healthcare Providers: BankingDealers.co.za  This test is not yet approved or  cleared by the [place] FDA and has been authorized for detection and/or diagnosis of SARS-CoV-2 by FDA under an Emergency Use Authorization (EUA).  This EUA will remain in effect (meaning this test can be used) for the duration of the COVID-19 declaration under Section 564(b)(1) of the Act, 21 U.S.C. section 360bbb-3(b)(1), unless the authorization is terminated or revoked sooner.  Performed at [facility], [place] [address] [place], [place] [address]   Culture, blood (routine x 2)     Status: None (Preliminary result)   Collection Time: 04/09/20  1:47 AM   Specimen: BLOOD  Result Value Ref Range Status   Specimen Description BLOOD BLOOD RIGHT FOREARM  Final   Special Requests   Final    BOTTLES DRAWN AEROBIC AND ANAEROBIC Blood Culture results may not be optimal due to an excessive volume of blood received in culture bottles   Culture   Final    NO GROWTH < 12 HOURS Performed at [facility], [address], [place], [place] [address]    Report Status PENDING  Incomplete      Radiology Studies: MR FOOT RIGHT WO CONTRAST  Result Date: 04/09/2020 CLINICAL DATA:  History of a skin ulceration on the right second toe. The patient is status post debridement of the wound 04/05/2020. Continued redness, pain and swelling. EXAM: MRI OF THE RIGHT FOREFOOT WITHOUT CONTRAST TECHNIQUE: Multiplanar, multisequence MR imaging of the right forefoot was performed. No intravenous contrast was administered. COMPARISON:  Plain films right foot 04/09/2020. FINDINGS: Bones/Joint/Cartilage There is no marrow edema to suggest osteomyelitis. The patient has remote healed fractures of the distal first and proximal second  metatarsals. No acute fracture is identified. The PIP joint of the second toe is laterally subluxed as seen on the prior plain films. Hallux valgus deformity is noted. 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 Ligaments The medial collateral ligament of the PIP joint of the second toe is difficult to visualize and may be torn. Otherwise negative. Muscles and Tendons No intramuscular fluid collection, tear or strain. Fatty atrophy of intrinsic musculature the foot noted. Soft tissues No focal fluid collection. Subcutaneous edema is seen in the dorsum of the foot. IMPRESSION: Negative for osteomyelitis, abscess or septic joint. Lateral subluxation PIP joint of the second toe. The medial collateral ligament  of the PIP joint is not visible and may be torn. Remote healed first and second metatarsal fractures. Hallux valgus and first MTP osteoarthritis. Subcutaneous edema in the dorsum of the foot could be due to dependent change or cellulitis. Electronically Signed   By: Inge Rise M.D.   On: 04/09/2020 14:21   DG Foot Complete Right  Result Date: 04/09/2020 CLINICAL DATA:  Foot swelling, evaluate for osteomyelitis EXAM: RIGHT FOOT COMPLETE - 3+ VIEW COMPARISON:  Radiograph 10/09/2017 FINDINGS: Diffuse circumferential swelling of the foot. Questionable ulceration along the plantar and lateral aspect adjacent the head of the fifth metatarsal. No other radiographically evident sites of ulceration are evident. No soft tissue gas or foreign body is seen. No osseous erosion, destructive change or periostitis to suggest osteomyelitis at this location or elsewhere within the foot at this time. There are posttraumatic deformities of the first metatarsal and second metatarsal base. There is a volar dislocation of the second proximal interphalangeal joint. 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 Hallux  valgus deformity with chronic soft tissue thickening along the medial head of the first metatarsal. Vascular calcium in the soft tissues. IMPRESSION: 1. Questionable ulceration along the plantar and lateral aspect adjacent the head of the fifth metatarsal. Correlate with visual inspection. 2. No other radiographically evident sites of ulceration within the foot at this time. 3. No radiographic evidence of osteomyelitis. If there is persisting clinical concern, MRI imaging is much more sensitive and specific for early features of osteomyelitis. 4. 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 5. Volar dislocation of the second proximal interphalangeal joint, new from prior. 6. Remote posttraumatic deformity of the first and second metatarsals. 7. Hallux valgus. Electronically Signed   By: Lovena Le M.D.   On: 04/09/2020 02:14     Scheduled Meds: . enoxaparin (LOVENOX) injection  40 mg Subcutaneous Q24H  . fluticasone furoate-vilanterol  1 puff Inhalation Daily  . furosemide  20 mg Oral BID  . metoprolol tartrate  100 mg Oral BID  . pantoprazole  40 mg Oral BID  . [START ON 04/10/2020] sertraline  50 mg Oral Daily  . umeclidinium bromide  1 puff Inhalation Daily   Continuous Infusions: . cefTRIAXone (ROCEPHIN)  IV Stopped (04/09/20 0441)  . vancomycin 1,000 mg (04/09/20 1645)     LOS: 0 days     [name], MD [facility] If 7PM-7AM, please contact night-coverage 04/09/2020, 5:21 PM

## 2020-04-09 NOTE — ED Notes (Addendum)
Provider messaged if a repeat lactic acid is wanted at [facility]. RN has not heard back from provider.

## 2020-04-09 NOTE — ED Notes (Signed)
Provider has been made aware of LA of 2.2

## 2020-04-09 NOTE — Progress Notes (Signed)
Pharmacy Antibiotic Note  Troy Lopez is a 79 y.o. male admitted on 04/09/2020 with cellulitis.  Pharmacy has been consulted for Vancomycin dosing.  Plan: Will adjust Vancomycin to 1000 mg IV Q 12 hrs, per Vancomycin nomogram for cellulitis (Wt. 80.3 kg; CrCrl 65.5 mL/min)   Height: 5\' 10"  (177.8 cm) Weight: 80.3 kg (177 lb) IBW/kg (Calculated) : 73  Temp (24hrs), Avg:97.9 F (36.6 C), Min:97.5 F (36.4 C), Max:98.2 F (36.8 C)  Recent Labs  Lab 04/07/20 1345 04/08/20 0147 04/08/20 0314 04/08/20 1224 04/09/20 0644  WBC 7.5  --   --  6.6  --   CREATININE  --   --   --  0.96  --   LATICACIDVEN  --  2.0* 2.2*  --  1.3    Estimated Creatinine Clearance: 65.5 mL/min (by C-G formula based on SCr of 0.96 mg/dL).    Allergies  Allergen Reactions  . 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  Rowland Lathe 04/09/2020 2:17 PM

## 2020-04-09 NOTE — Interval H&P Note (Signed)
History and Physical Interval Note:  04/09/2020 7:52 PM  Troy Lopez  has presented today for surgery, with the diagnosis of toe infection.  The various methods of treatment have been discussed with the patient and family. After consideration of risks, benefits and other options for treatment, the patient has consented to  Procedure(s): AMPUTATION TOE (Right) as a surgical intervention.  The patient's history has been reviewed, patient examined, no change in status, stable for surgery.  I have reviewed the patient's chart and labs.  Questions were answered to the patient's satisfaction.     Durward Fortes

## 2020-04-09 NOTE — H&P (View-Only) (Signed)
Reason for Consult: Cellulitis with ulceration right second toe suspicious for osteomyelitis. Referring Physician: Demontae Antunes Navratil is an 79 y.o. male.  HPI: 79 y.o. male with a recent history of ulceration and cellulitis in his right second toe.  Patient was seen outpatient and also noted to have a partial dislocation of his second toe.  Placed on antibiotics.  Failed outpatient antibiotic treatment with increased cellulitis and presented to the emergency department for evaluation.  He does relate an injury a couple of months ago that may have been the timing for the dislocation.   Past Medical History:  Diagnosis Date   Adrenal gland anomaly    enlargement   Anemia    IDA   Anxiety    CAD (coronary artery disease)    Carpal tunnel syndrome    Chronic back pain    Chronic hyponatremia    Colitis    Colonic polyp    COPD (chronic obstructive pulmonary disease) (HCC)    Degenerative disc disease, lumbar    Depression    Diverticulosis    Dyspnea    Gastric AVM    GERD (gastroesophageal reflux disease)    H/O degenerative disc disease    Hypercholesterolemia    Hyperkalemia    Hyperlipidemia    Hypertension    Irritable bowel syndrome    Monoclonal gammopathy    Monoclonal gammopathy    Neuropathy    Personal history of tobacco use, presenting hazards to health 08/17/2015   Sleep apnea    Vertebral compression fracture [facility])     Past Surgical History:  Procedure Laterality Date   BUNIONECTOMY  1989   CARDIAC CATHETERIZATION     CARPAL TUNNEL RELEASE Left 2011   ulnar nerve sub muscular at elbow   CHOLECYSTECTOMY  09/07   COLONOSCOPY WITH PROPOFOL N/A 03/05/2017   Procedure: COLONOSCOPY WITH PROPOFOL;  Surgeon: Lucilla Lame, MD;  Location: [facility] ENDOSCOPY;  Service: Endoscopy;  Laterality: N/A;   ELECTROPHYSIOLOGIC STUDY N/A 11/15/2015   Procedure: CARDIOVERSION;  Surgeon: Isaias Cowman, MD;  Location: [facility] ORS;  Service:  Cardiovascular;  Laterality: N/A;   ESOPHAGOGASTRODUODENOSCOPY (EGD) WITH PROPOFOL N/A 03/05/2017   Procedure: ESOPHAGOGASTRODUODENOSCOPY (EGD) WITH PROPOFOL;  Surgeon: Lucilla Lame, MD;  Location: [facility] ENDOSCOPY;  Service: Endoscopy;  Laterality: N/A;   ESOPHAGOGASTRODUODENOSCOPY (EGD) WITH PROPOFOL N/A 10/17/2017   Procedure: ESOPHAGOGASTRODUODENOSCOPY (EGD) WITH PROPOFOL;  Surgeon: Lollie Sails, MD;  Location: [facility] ENDOSCOPY;  Service: Endoscopy;  Laterality: N/A;   EYE SURGERY Bilateral 2010   cataract   HEMORRHOID SURGERY     KYPHOPLASTY N/A 11/04/2016   Procedure: KYPHOPLASTY T 12;  Surgeon: Hessie Knows, MD;  Location: [facility] ORS;  Service: Orthopedics;  Laterality: N/A;    Family History  Problem Relation Age of Onset   Stroke Mother    Heart disease Father        MI - 83    Colon cancer Neg Hx    Prostate cancer Neg Hx     Social History:  reports that he quit smoking about 4 years ago. His smoking use included cigarettes. He started smoking about 62 years ago. He has a 116.00 pack-year smoking history. He has never used smokeless tobacco. He reports current alcohol use of about 42.0 standard drinks of alcohol per week. He reports that he does not use drugs.  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   Not to be  given due to Waldenstrom's syndrome, told it could affect kidney function    Medications:  Scheduled:  enoxaparin (LOVENOX) injection  40 mg Subcutaneous Q24H    Results for orders placed or performed during the hospital encounter of 04/09/20 (from the past 48 hour(s))  Lactic acid, plasma     Status: Abnormal   Collection Time: 04/08/20  1:47 AM  Result Value Ref Range   Lactic Acid, Venous 2.0 (HH) 0.5 - 1.9 mmol/L    Comment: CRITICAL RESULT CALLED TO, READ BACK BY AND VERIFIED WITH ASHTON PETERS ON 04/09/20 AT 0223 BY  JAG Performed at [facility], [address] [place], [place] [address]   Lactic acid, plasma     Status: Abnormal   Collection Time: 04/08/20  3:14 AM  Result Value Ref Range   Lactic Acid, Venous 2.2 (HH) 0.5 - 1.9 mmol/L    Comment: CRITICAL VALUE NOTED. VALUE IS CONSISTENT WITH PREVIOUSLY REPORTED/CALLED VALUE / JAG Performed at [facility], [address] [place], [place] [address]   Culture, blood (routine x 2)     Status: None (Preliminary result)   Collection Time: 04/08/20 12:20 PM   Specimen: BLOOD  Result Value Ref Range   Specimen Description BLOOD BLOOD LEFT FOREARM    Special Requests      BOTTLES DRAWN AEROBIC AND ANAEROBIC Blood Culture adequate volume   Culture      NO GROWTH < 12 HOURS Performed at [facility], [address], [place], [place] [address]    Report Status PENDING   Comprehensive metabolic panel     Status: Abnormal   Collection Time: 04/08/20 12:24 PM  Result Value Ref Range   Sodium 126 (L) 135 - 145 mmol/L   Potassium 4.8 3.5 - 5.1 mmol/L   Chloride 91 (L) 98 - 111 mmol/L   CO2 24 22 - 32 mmol/L   Glucose, Bld 79 70 - 99 mg/dL    Comment: Glucose reference range applies only to samples taken after fasting for at least 8 hours.   BUN 18 8 - 23 mg/dL   Creatinine, Ser 0.96 0.61 - 1.24 mg/dL   Calcium 9.6 8.9 - 10.3 mg/dL   Total Protein 8.3 (H) 6.5 - 8.1 g/dL   Albumin 3.5 3.5 - 5.0 g/dL   AST 17 15 - 41 U/L   ALT 11 0 - 44 U/L   Alkaline Phosphatase 80 38 - 126 U/L   Total Bilirubin 0.9 0.3 - 1.2 mg/dL   GFR calc non Af Amer >60 >60 mL/min   GFR calc Af Amer >60 >60 mL/min   Anion gap 11 5 - 15    Comment: Performed at [facility], [address] [place], [place] [address]  CBC with Differential     Status: Abnormal   Collection Time: 04/08/20 12:24 PM  Result Value Ref Range   WBC 6.6 4.0 - 10.5 K/uL   RBC 2.95 (L) 4.22 - 5.81 MIL/uL   Hemoglobin 9.8 (L) 13.0 - 17.0 g/dL   HCT 31.1 (L)  39 - 52 %   MCV 105.4 (H) 80.0 - 100.0 fL   MCH 33.2 26.0 - 34.0 pg   MCHC 31.5 30.0 - 36.0 g/dL   RDW 21.1 (H) 11.5 - 15.5 %   Platelets 336 150 - 400 K/uL   nRBC 0.0 0.0 - 0.2 %   Neutrophils Relative % 73 %   Neutro Abs 4.8 1.7 - 7.7 K/uL   Lymphocytes Relative  16 %   Lymphs Abs 1.1 0.7 - 4.0 K/uL   Monocytes Relative 9 %   Monocytes Absolute 0.6 0 - 1 K/uL   Eosinophils Relative 0 %   Eosinophils Absolute 0.0 0 - 0 K/uL   Basophils Relative 1 %   Basophils Absolute 0.0 0 - 0 K/uL   WBC Morphology MORPHOLOGY UNREMARKABLE    RBC Morphology MIXED RBC POPULATION    Smear Review Normal platelet morphology    Immature Granulocytes 1 %   Abs Immature Granulocytes 0.03 0.00 - 0.07 K/uL    Comment: Performed at [facility], [address] [place], [place] [address]  Folate     Status: Abnormal   Collection Time: 04/08/20 12:24 PM  Result Value Ref Range   Folate 4.9 (L) >5.9 ng/mL    Comment: Performed at [facility], [address] [place], [place] [address]  SARS Coronavirus 2 by RT PCR (hospital order, performed in [facility] hospital lab) Nasopharyngeal Nasopharyngeal Swab     Status: None   Collection Time: 04/09/20  1:38 AM   Specimen: Nasopharyngeal Swab  Result Value Ref Range   SARS Coronavirus 2 NEGATIVE NEGATIVE    Comment: (NOTE) SARS-CoV-2 target nucleic acids are NOT DETECTED.  The SARS-CoV-2 RNA is generally detectable in upper and lower respiratory specimens during the acute phase of infection. The lowest concentration of SARS-CoV-2 viral copies this assay can detect is 250 copies / mL. A negative result does not preclude SARS-CoV-2 infection and should not be used as the sole basis for treatment or other patient management decisions.  A negative result may occur with improper specimen collection / handling, submission of specimen other than nasopharyngeal swab, presence of viral mutation(s) within the areas targeted by this assay, and  inadequate number of viral copies (<250 copies / mL). A negative result must be combined with clinical observations, patient history, and epidemiological information.  Fact Sheet for Patients:   StrictlyIdeas.no  Fact Sheet for Healthcare Providers: BankingDealers.co.za  This test is not yet approved or  cleared by the [place] FDA and has been authorized for detection and/or diagnosis of SARS-CoV-2 by FDA under an Emergency Use Authorization (EUA).  This EUA will remain in effect (meaning this test can be used) for the duration of the COVID-19 declaration under Section 564(b)(1) of the Act, 21 U.S.C. section 360bbb-3(b)(1), unless the authorization is terminated or revoked sooner.  Performed at [facility], [address] [place], [place] [address]   Culture, blood (routine x 2)     Status: None (Preliminary result)   Collection Time: 04/09/20  1:47 AM   Specimen: BLOOD  Result Value Ref Range   Specimen Description BLOOD BLOOD RIGHT FOREARM    Special Requests      BOTTLES DRAWN AEROBIC AND ANAEROBIC Blood Culture results may not be optimal due to an excessive volume of blood received in culture bottles   Culture      NO GROWTH < 12 HOURS Performed at [facility], [address] [place], [place] [address]    Report Status PENDING   Lactic acid, plasma     Status: None   Collection Time: 04/09/20  6:44 AM  Result Value Ref Range   Lactic Acid, Venous 1.3 0.5 - 1.9 mmol/L    Comment: Performed at [facility], [address] [place], [place] [address]    MR FOOT RIGHT WO CONTRAST  Result Date: 04/09/2020 CLINICAL DATA:  History of a  skin ulceration on the right second toe. The patient is status post debridement of the wound 04/05/2020. Continued redness, pain and swelling. EXAM: MRI OF THE RIGHT FOREFOOT WITHOUT CONTRAST TECHNIQUE: Multiplanar, multisequence MR imaging of the right forefoot  was performed. No intravenous contrast was administered. COMPARISON:  Plain films right foot 04/09/2020. FINDINGS: Bones/Joint/Cartilage There is no marrow edema to suggest osteomyelitis. The patient has remote healed fractures of the distal first and proximal second metatarsals. No acute fracture is identified. The PIP joint of the second toe is laterally subluxed as seen on the prior plain films. Hallux valgus deformity is noted. 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 Ligaments The medial collateral ligament of the PIP joint of the second toe is difficult to visualize and may be torn. Otherwise negative. Muscles and Tendons No intramuscular fluid collection, tear or strain. Fatty atrophy of intrinsic musculature the foot noted. Soft tissues No focal fluid collection. Subcutaneous edema is seen in the dorsum of the foot. IMPRESSION: Negative for osteomyelitis, abscess or septic joint. Lateral subluxation PIP joint of the second toe. The medial collateral ligament of the PIP joint is not visible and may be torn. Remote healed first and second metatarsal fractures. Hallux valgus and first MTP osteoarthritis. Subcutaneous edema in the dorsum of the foot could be due to dependent change or cellulitis. Electronically Signed   By: Inge Rise M.D.   On: 04/09/2020 14:21   DG Foot Complete Right  Result Date: 04/09/2020 CLINICAL DATA:  Foot swelling, evaluate for osteomyelitis EXAM: RIGHT FOOT COMPLETE - 3+ VIEW COMPARISON:  Radiograph 10/09/2017 FINDINGS: Diffuse circumferential swelling of the foot. Questionable ulceration along the plantar and lateral aspect adjacent the head of the fifth metatarsal. No other radiographically evident sites of ulceration are evident. No soft tissue gas or foreign body is seen. No osseous erosion, destructive change or periostitis to suggest osteomyelitis at this location or elsewhere within the foot at this time. There are posttraumatic deformities of the first metatarsal and  second metatarsal base. There is a volar dislocation of the second proximal interphalangeal joint. 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 Hallux valgus deformity with chronic soft tissue thickening along the medial head of the first metatarsal. Vascular calcium in the soft tissues. IMPRESSION: 1. Questionable ulceration along the plantar and lateral aspect adjacent the head of the fifth metatarsal. Correlate with visual inspection. 2. No other radiographically evident sites of ulceration within the foot at this time. 3. No radiographic evidence of osteomyelitis. If there is persisting clinical concern, MRI imaging is much more sensitive and specific for early features of osteomyelitis. 4. 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 5. Volar dislocation of the second proximal interphalangeal joint, new from prior. 6. Remote posttraumatic deformity of the first and second metatarsals. 7. Hallux valgus. Electronically Signed   By: Lovena Le M.D.   On: 04/09/2020 02:14    Review of Systems  Constitutional: Negative for chills and fever.  HENT: Negative for sinus pain and sore throat.   Respiratory: Negative for cough and shortness of breath.   Cardiovascular: Negative for chest pain and palpitations.  Gastrointestinal: Negative for nausea and vomiting.  Endocrine: Negative for polydipsia and polyuria.  Genitourinary: Negative for frequency and urgency.  Musculoskeletal:       Wife relates some increased contracture in his right second toe.  Neurological:  Patient does have numbness associated with his diabetes.  Psychiatric/Behavioral: Negative for confusion. The patient is not nervous/anxious.    Blood pressure (!) 157/81, pulse 69, temperature (!) 97.5 F (36.4 C), resp. rate 13, height 5\' 10"  (1.778 m), weight 80.3 kg, SpO2 100 %. Physical  Exam Cardiovascular:     Comments: DP pulses are palpable.  PT pulse palpable but diminished on the right. Musculoskeletal:     Comments: Adequate range of motion of the pedal joints.  Upon palpation there is clinically some dislocation of the middle and distal phalanx onto the head of the proximal phalanx of the second toe.  This was also noted on x-ray and increased dislocation noted as compared to previous films taken 4 days ago in the office.  Skin:    Comments: The skin is warm dry and mildly atrophic.  Significant erythema and edema in the entire right foot as compared to the left.  Significant focal erythema and edema in the right second toe with a full-thickness draining ulceration along the medial aspect of the toe at the PIPJ area.  Neurological:     Comments: Loss of protective threshold with a monofilament wire distally especially in the great toes.     Assessment/Plan: Assessment: 1.  Dislocation right second toe PIPJ. 2.  Diabetes with neuropathy. 3.  Full-thickness ulceration with significant cellulitis right second toe and foot.  Plan: Discussed with the patient and his wife that even though the MRI did not clearly show evidence of osteomyelitis that with the full-thickness ulceration and drainage from the toe he clearly has a septic joint situation and most likely has some early osteomyelitis in the second toe.  Discussed with the patient that at this point I think his best recommendation would be for amputation of the right second toe as I do not think this could be fixed or corrected.  Discussed possible risks and complications of the procedure including but not limited to continued infection as well as nonhealing due to his diabetes which could necessitate further surgery.  Questions invited and answered.  We will obtain consent for amputation right second toe.  Patient is n.p.o. now.  Plan for surgery later this evening.  Durward Fortes 04/09/2020, 3:49 PM

## 2020-04-09 NOTE — Progress Notes (Signed)
Dr. Kayleen Memos okay with patient having his bp in at 172/80. He thinks it may be attributed to his needing to have a bowel movement. I will take patient to the floor and alert them that he is having stomach pains after sitting on the bedside for a few minutes and stating he cannot use it.

## 2020-04-09 NOTE — Transfer of Care (Signed)
Immediate Anesthesia Transfer of Care Note  Patient: Troy Lopez  Procedure(s) Performed: AMPUTATION TOE (Right Toe)  Patient Location: PACU  Anesthesia Type:General  Level of Consciousness: awake, alert  and oriented  Airway & Oxygen Therapy: Patient Spontanous Breathing and Patient connected to face mask oxygen  Post-op Assessment: Report given to RN and Post -op Vital signs reviewed and stable  Post vital signs: Reviewed and stable  Last Vitals:  Vitals Value Taken Time  BP    Temp    Pulse    Resp    SpO2      Last Pain:  Vitals:   04/09/20 1255  TempSrc:   PainSc: 0-No pain         Complications: No complications documented.

## 2020-04-09 NOTE — Progress Notes (Signed)
Pharmacy Antibiotic Note  Troy Lopez is a 79 y.o. male admitted on 04/09/2020 with cellulitis.  Pharmacy has been consulted for Vancomycin dosing.  Plan: Vancomycin 1750 mg IV Q 24 hrs. Goal AUC 400-550. Expected AUC: 515.2, Css min 11 SCr used: 0/96   Height: 5\' 10"  (177.8 cm) Weight: 80.3 kg (177 lb) IBW/kg (Calculated) : 73  Temp (24hrs), Avg:97.9 F (36.6 C), Min:97.5 F (36.4 C), Max:98.2 F (36.8 C)  Recent Labs  Lab 04/07/20 1345 04/08/20 0147 04/08/20 0314 04/08/20 1224  WBC 7.5  --   --  6.6  CREATININE  --   --   --  0.96  LATICACIDVEN  --  2.0* 2.2*  --     Estimated Creatinine Clearance: 65.5 mL/min (by C-G formula based on SCr of 0.96 mg/dL).    Allergies  Allergen Reactions  . 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  Hart Robinsons A 04/09/2020 4:08 AM

## 2020-04-09 NOTE — Op Note (Signed)
Date of operation: 04/09/2020.  Surgeon: Durward Fortes D.P.M.  Preoperative diagnosis: Dislocation right second toe PIPJ with infection.  Postoperative diagnosis: Same.  Procedure: Amputation right second toe.  Anesthesia: Local MAC.  Hemostasis: Pneumatic tourniquet right ankle 250 mmHg.  Estimated blood loss: Less than 5 cc.  Pathology: Right second toe.  Cultures: Bone culture proximal phalanx head right second toe.  Injectables: 7 cc 0.5% Marcaine plain.  Complications: None apparent.  Operative indications: This is a 79 year old male with recent development of ulceration and infection on his right second toe.  Was seen outpatient and placed on antibiotics and also noted to have a partial dislocation of the proximal inner phalangeal joint.  Infection not controlled on oral antibiotics and noted to have complete dislocation at the PIPJ on hospital films.  Decision was made for amputation of the right second toe.  Operative procedure: Patient was taken to the operating room and placed on the table in the supine position.  Following satisfactory sedation the right foot was anesthetized with 10 cc of 0.5% Marcaine plain.  Pneumatic tourniquet applied to the level of the right ankle and the foot was prepped and draped in usual sterile fashion.  The foot was exsanguinated and the tourniquet inflated to 250 mmHg. Attention was then directed to the distal right foot where an elliptical incision was made coursing dorsal to plantar around the medial and lateral aspect of the second toe.  Patient was noted to exhibit a pain response along the plantar portion of the incision and an additional 7 cc of 0.5% Marcaine plain was then injected.  Incision was carried sharply down to the level of the bone and periosteal dissection carried proximally back to the level of the joint where the toe was disarticulated and removed in toto.  The wound was flushed with copious amounts of sterile saline and closed  using 3-0 nylon vertical mattress and simple interrupted sutures as well as a few 4-0 nylon simple interrupted sutures.  Xeroform 4 x 4 gauze and con form applied to the foot.  Tourniquet was released.  ABD Kerlix and Ace wrap then applied.  Patient tolerated the procedure and anesthesia well and was awakened and transported the PACU with vital signs stable and in good condition.

## 2020-04-09 NOTE — ED Notes (Signed)
Breakfast tray given. °

## 2020-04-09 NOTE — Anesthesia Procedure Notes (Signed)
Date/Time: 04/09/2020 8:06 PM Performed by: Johnna Acosta, CRNA Pre-anesthesia Checklist: Patient identified, Emergency Drugs available, Suction available, Patient being monitored and Timeout performed Patient Re-evaluated:Patient Re-evaluated prior to induction Oxygen Delivery Method: Nasal cannula Preoxygenation: Pre-oxygenation with 100% oxygen Induction Type: IV induction

## 2020-04-09 NOTE — Progress Notes (Signed)
Patients BP 198/92. Messaged MD, new order for patients home medications Metoprolol 100 mg and Lasix 20 mg. Medications were given. Rechecked BP 181/90. Messaged MD again, new order Hydralazine 5mg  IV.

## 2020-04-09 NOTE — ED Notes (Signed)
Date and time results received: 04/09/20 0224 (use smartphrase ".now" to insert current time)  Test: lactic Acid Critical Value: 2.0  Name of Provider Notified: Dr. Beather Arbour  Orders Received? Or Actions Taken?: provider notified

## 2020-04-09 NOTE — H&P (Signed)
History and Physical    Troy Lopez CVE:[id number] DOB: 09/25/1940 DOA: 04/09/2020  PCP: Einar Pheasant, MD   Patient coming from: home  I have personally briefly reviewed patient's old medical records in [place]  Chief Complaint: Pain and redness right second toe and foot  HPI: Troy Lopez is a 79 y.o. male with medical history significant for CAD, paroxysmal a flutter, COPD, who presented to podiatry on 04/05/2020 with an ulcer of the right second toe which was debrided and he was sent home on doxycycline who presents to the emergency room with complaints of increased redness and swelling of the toe now with pain swelling and redness of the right foot no advance into the ankle and lower leg.  He denies fever or chills.  Denies cough. ED Course: Upon arrival, he was afebrile with normal vitals.  WBC 6.6 but with lactic acid of 2.0.  Hemoglobin 9.8, sodium 126.  Foot x-ray shows no evidence of osteomyelitis recommend MRI if persisting clinical concern.  Patient started on vancomycin.  Hospitalist consulted for admission.  Review of Systems: As per HPI otherwise all other systems on review of systems negative.    Past Medical History:  Diagnosis Date  . Adrenal gland anomaly    enlargement  . Anemia    IDA  . Anxiety   . CAD (coronary artery disease)   . Carpal tunnel syndrome   . Chronic back pain   . Chronic hyponatremia   . Colitis   . Colonic polyp   . COPD (chronic obstructive pulmonary disease) ([place])   . Degenerative disc disease, lumbar   . Depression   . Diverticulosis   . Dyspnea   . Gastric AVM   . GERD (gastroesophageal reflux disease)   . H/O degenerative disc disease   . Hypercholesterolemia   . Hyperkalemia   . Hyperlipidemia   . Hypertension   . Irritable bowel syndrome   . Monoclonal gammopathy   . Monoclonal gammopathy   . Neuropathy   . Personal history of tobacco use, presenting hazards to health 08/17/2015  . Sleep apnea   .  Vertebral compression fracture [facility])     Past Surgical History:  Procedure Laterality Date  . BUNIONECTOMY  1989  . CARDIAC CATHETERIZATION    . CARPAL TUNNEL RELEASE Left 2011   ulnar nerve sub muscular at elbow  . CHOLECYSTECTOMY  09/07  . COLONOSCOPY WITH PROPOFOL N/A 03/05/2017   Procedure: COLONOSCOPY WITH PROPOFOL;  Surgeon: Lucilla Lame, MD;  Location: [facility] ENDOSCOPY;  Service: Endoscopy;  Laterality: N/A;  . ELECTROPHYSIOLOGIC STUDY N/A 11/15/2015   Procedure: CARDIOVERSION;  Surgeon: Isaias Cowman, MD;  Location: [facility] ORS;  Service: Cardiovascular;  Laterality: N/A;  . ESOPHAGOGASTRODUODENOSCOPY (EGD) WITH PROPOFOL N/A 03/05/2017   Procedure: ESOPHAGOGASTRODUODENOSCOPY (EGD) WITH PROPOFOL;  Surgeon: Lucilla Lame, MD;  Location: [facility] ENDOSCOPY;  Service: Endoscopy;  Laterality: N/A;  . ESOPHAGOGASTRODUODENOSCOPY (EGD) WITH PROPOFOL N/A 10/17/2017   Procedure: ESOPHAGOGASTRODUODENOSCOPY (EGD) WITH PROPOFOL;  Surgeon: Lollie Sails, MD;  Location: [facility] ENDOSCOPY;  Service: Endoscopy;  Laterality: N/A;  . EYE SURGERY Bilateral 2010   cataract  . HEMORRHOID SURGERY    . KYPHOPLASTY N/A 11/04/2016   Procedure: KYPHOPLASTY T 12;  Surgeon: Hessie Knows, MD;  Location: [facility] ORS;  Service: Orthopedics;  Laterality: N/A;     reports that he quit smoking about 4 years ago. His smoking use included cigarettes. He started smoking about 62 years ago. He has a 116.00 pack-year smoking history. He  has never used smokeless tobacco. He reports current alcohol use of about 42.0 standard drinks of alcohol per week. He reports that he does not use drugs.  Allergies  Allergen Reactions  . Iodinated Diagnostic Agents Other (See Comments)    Contraindication secondary to IGMgammopathy/Waldren's syndrome   Not to be given due to Waldenstrom's syndrome, told it could affect kidney function    Family History  Problem Relation Age of Onset  . Stroke Mother   . Heart disease Father        MI  - 28   . Colon cancer Neg Hx   . Prostate cancer Neg Hx       Prior to Admission medications   Medication Sig Start Date End Date Taking? Authorizing Provider  acetaminophen (TYLENOL) 500 MG tablet Take 500-1,000 mg by mouth 3 (three) times daily as needed for moderate pain or headache.    [provider]  albuterol (VENTOLIN HFA) 108 (90 Base) MCG/ACT inhaler Inhale 2 puffs into the lungs every 6 (six) hours as needed for wheezing or shortness of breath. 02/14/20   Brand Males, MD  azelastine (ASTELIN) 0.1 % nasal spray Place 1 spray into both nostrils 2 (two) times daily. Use in each nostril as directed 11/24/19   [name], NP  budesonide (ENTOCORT EC) 3 MG 24 hr capsule Take 9 mg by mouth daily as needed (colitis flare).  05/31/15   [provider]  calcium-vitamin D (OSCAL WITH D) 500-200 MG-UNIT tablet Take 2 tablets by mouth daily.    [provider]  doxycycline (VIBRAMYCIN) 100 MG capsule Take 100 mg by mouth 2 (two) times daily. 04/05/20   [provider]  ELIQUIS 5 MG TABS tablet Take 5 mg by mouth 2 (two) times daily.  05/23/16   [provider]  FIBER PO Take 1 capsule by mouth daily.     [provider]  fluticasone (FLONASE) 50 MCG/ACT nasal spray Place 1 spray into both nostrils daily. 11/24/19   [name], NP  Fluticasone-Umeclidin-Vilant (TRELEGY ELLIPTA) 100-62.5-25 MCG/INH AEPB Inhale 1 puff into the lungs daily. 02/14/20   Brand Males, MD  furosemide (LASIX) 20 MG tablet Take 1 tablet (20 mg total) by mouth daily as needed. Patient taking differently: 40 mg.  04/08/17   Einar Pheasant, MD  gabapentin (NEURONTIN) 100 MG capsule Take 4 capsules (400 mg) daily in the morning and midday. Take 8 capsules (800 mg) daily in the evening. 03/22/20   Einar Pheasant, MD  isosorbide mononitrate (IMDUR) 30 MG 24 hr tablet Take 1 tablet (30 mg total) by mouth once daily. Patient not taking: Reported on 03/10/2020  11/27/17   [provider]  metoprolol tartrate (LOPRESSOR) 100 MG tablet Take 1 tablet (100 mg total) by mouth 2 (two) times daily. 04/04/20   Einar Pheasant, MD  metoprolol tartrate (LOPRESSOR) 25 MG tablet Take 25 mg by mouth daily.  01/24/20   [provider]  pantoprazole (PROTONIX) 40 MG tablet Take 1 tablet (40 mg total) by mouth 2 (two) times daily. 03/22/20   Einar Pheasant, MD  sertraline (ZOLOFT) 50 MG tablet Take 1 tablet (50 mg total) by mouth daily. 03/22/20   Einar Pheasant, MD  triamcinolone ointment (KENALOG) 0.1 %  06/24/19   [provider]  vitamin B-12 (CYANOCOBALAMIN) 1000 MCG tablet Take 1,000 mcg by mouth daily.     [provider]    Physical Exam: Vitals:   04/08/20 6213 04/09/20 0135 04/09/20 0300 04/09/20  0315  BP: (!) 149/73 (!) 182/91 (!) 159/58   Pulse: 67 73 72 65  Resp: 16 15  13   Temp: (!) 97.5 F (36.4 C)     TempSrc:      SpO2: 96% 100% 91% 99%  Weight:      Height:         Vitals:   04/08/20 2337 04/09/20 0135 04/09/20 0300 04/09/20 0315  BP: (!) 149/73 (!) 182/91 (!) 159/58   Pulse: 67 73 72 65  Resp: 16 15  13   Temp: (!) 97.5 F (36.4 C)     TempSrc:      SpO2: 96% 100% 91% 99%  Weight:      Height:          Constitutional: Alert and oriented x 3 . Not in any apparent distress HEENT:      Head: Normocephalic and atraumatic.         Eyes: PERLA, EOMI, Conjunctivae are normal. Sclera is non-icteric.       Mouth/Throat: Mucous membranes are moist.       Neck: Supple with no signs of meningismus. Cardiovascular: Regular rate and rhythm. No murmurs, gallops, or rubs. 2+ symmetrical distal pulses are present . No JVD. Respiratory: Respiratory effort normal .Lungs sounds clear bilaterally. No wheezes, crackles, or rhonchi.  Gastrointestinal: Soft, non tender, and non distended with positive bowel sounds. No rebound or guarding. Genitourinary: No CVA tenderness. Musculoskeletal: Nontender with normal range of  motion in all extremities.  Pain swelling redness and tenderness right foot extending from the toes to right ankle Neurologic: Normal speech and language. Face is symmetric. Moving all extremities. No gross focal neurologic deficits . Skin:  Erythema right foot Psychiatric: Mood and affect are normal Speech and behavior are normal   Labs on Admission: I have personally reviewed following labs and imaging studies  CBC: Recent Labs  Lab 04/07/20 1345 04/08/20 1224  WBC 7.5 6.6  NEUTROABS 5.3 4.8  HGB 9.6* 9.8*  HCT 29.4* 31.1*  MCV 101.7* 105.4*  PLT 321 364   Basic Metabolic Panel: Recent Labs  Lab 04/08/20 1224  NA 126*  K 4.8  CL 91*  CO2 24  GLUCOSE 79  BUN 18  CREATININE 0.96  CALCIUM 9.6   GFR: Estimated Creatinine Clearance: 65.5 mL/min (by C-G formula based on SCr of 0.96 mg/dL). Liver Function Tests: Recent Labs  Lab 04/08/20 1224  AST 17  ALT 11  ALKPHOS 80  BILITOT 0.9  PROT 8.3*  ALBUMIN 3.5   No results for input(s): LIPASE, AMYLASE in the last 168 hours. No results for input(s): AMMONIA in the last 168 hours. Coagulation Profile: No results for input(s): INR, PROTIME in the last 168 hours. Cardiac Enzymes: No results for input(s): CKTOTAL, CKMB, CKMBINDEX, TROPONINI in the last 168 hours. BNP (last 3 results) No results for input(s): PROBNP in the last 8760 hours. HbA1C: No results for input(s): HGBA1C in the last 72 hours. CBG: No results for input(s): GLUCAP in the last 168 hours. Lipid Profile: No results for input(s): CHOL, HDL, LDLCALC, TRIG, CHOLHDL, LDLDIRECT in the last 72 hours. Thyroid Function Tests: No results for input(s): TSH, T4TOTAL, FREET4, T3FREE, THYROIDAB in the last 72 hours. Anemia Panel: Recent Labs    04/07/20 1345  FERRITIN 69  TIBC 910*  IRON 101   Urine analysis:    Component Value Date/Time   COLORURINE YELLOW (A) 02/18/2018 1136   APPEARANCEUR CLEAR (A) 02/18/2018 1136   LABSPEC 1.008 02/18/2018  [place] 5.0 02/18/2018 1136   GLUCOSEU NEGATIVE 02/18/2018 1136   HGBUR NEGATIVE 02/18/2018 [place] 02/18/2018 1136   KETONESUR NEGATIVE 02/18/2018 1136   PROTEINUR NEGATIVE 02/18/2018 1136   NITRITE NEGATIVE 02/18/2018 1136   LEUKOCYTESUR NEGATIVE 02/18/2018 1136    Radiological Exams on Admission: DG Foot Complete Right  Result Date: 04/09/2020 CLINICAL DATA:  Foot swelling, evaluate for osteomyelitis EXAM: RIGHT FOOT COMPLETE - 3+ VIEW COMPARISON:  Radiograph 10/09/2017 FINDINGS: Diffuse circumferential swelling of the foot. Questionable ulceration along the plantar and lateral aspect adjacent the head of the fifth metatarsal. No other radiographically evident sites of ulceration are evident. No soft tissue gas or foreign body is seen. No osseous erosion, destructive change or periostitis to suggest osteomyelitis at this location or elsewhere within the foot at this time. There are posttraumatic deformities of the first metatarsal and second metatarsal base. There is a volar dislocation of the second proximal interphalangeal joint. 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 Hallux valgus deformity with chronic soft tissue thickening along the medial head of the first metatarsal. Vascular calcium in the soft tissues. IMPRESSION: 1. Questionable ulceration along the plantar and lateral aspect adjacent the head of the fifth metatarsal. Correlate with visual inspection. 2. No other radiographically evident sites of ulceration within the foot at this time. 3. No radiographic evidence of osteomyelitis. If there is persisting clinical concern, MRI imaging is much more sensitive and specific for early features of osteomyelitis. 4. 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 5. Volar dislocation of the second proximal interphalangeal joint, new from prior.  6. Remote posttraumatic deformity of the first and second metatarsals. 7. Hallux valgus. Electronically Signed   By: Lovena Le M.D.   On: 04/09/2020 02:14   Assessment/Plan 79 year old male with history of HTN, CAD, paroxysmal a flutter, COPD, presenting with cellulitis of right foot thickening debridement of ulcer on right second toe, worsening in spite of doxycycline started on 04/05/2020  Cellulitis of  right foot following debridement ulcer right second toe -Patient with cellulitis second toe now extending to right foot not responding to outpatient doxycycline -Lactic acid was 2 but otherwise normal WBC, afebrile and not meeting sepsis criteria -No evidence of osteomyelitis on x-ray -IV Rocephin and vancomycin-Patient was seen by podiatry on 04/05/2020 when he underwent debridement -Podiatry consult:    CAD (coronary artery disease) -Continue home meds pending med rec, isosorbide and metoprolol    Hypertension -Continue metoprolol pending med rec    Moderate COPD (chronic obstructive pulmonary disease) (HCC) -Continue albuterol and budesonide and Trelegy pending med rec    Atrial flutter ([place]) -Patient was off eliquis for an upcoming GI procedure --Will keep off Eliquis in the event podiatry wants to perform further debridement(Lovenox prophylaxis only)    DVT prophylaxis: lovenoxCode Status: full code  Family Communication:  none  Disposition Plan: Back to previous home environment Consults called: Podiatry Status:At the time of admission, it appears that the appropriate admission status for this patient is INPATIENT. This is judged to be reasonable and necessary in order to provide the required intensity of service to ensure the patient's safety given the presenting symptoms, physical exam findings, and initial radiographic and laboratory data in the context of their  Comorbid conditions.   Patient requires inpatient status due to high intensity of service, high risk for  further  deterioration and high frequency of surveillance required.   I certify that at the point of admission it is my clinical judgment that the patient will require inpatient hospital care spanning beyond [facility] MD [facility]     04/09/2020, 3:19 AM

## 2020-04-10 ENCOUNTER — Inpatient Hospital Stay: Payer: PPO | Admitting: Certified Registered Nurse Anesthetist

## 2020-04-10 ENCOUNTER — Ambulatory Visit: Admission: RE | Admit: 2020-04-10 | Payer: PPO | Source: Home / Self Care | Admitting: Internal Medicine

## 2020-04-10 ENCOUNTER — Encounter
Admission: EM | Disposition: A | Discharge status: Home / Self Care | Payer: Self-pay | Source: Home / Self Care | Attending: Hospitalist

## 2020-04-10 ENCOUNTER — Encounter: Admission: RE | Payer: Self-pay | Source: Home / Self Care

## 2020-04-10 HISTORY — PX: ESOPHAGOGASTRODUODENOSCOPY: SHX5428

## 2020-04-10 HISTORY — DX: Angiodysplasia of stomach and duodenum without bleeding: K31.819

## 2020-04-10 HISTORY — DX: Anemia, unspecified: D64.9

## 2020-04-10 HISTORY — DX: Other chronic pain: G89.29

## 2020-04-10 LAB — CBC
HCT: 26 % — ABNORMAL LOW (ref 39.0–52.0)
Hemoglobin: 8.6 g/dL — ABNORMAL LOW (ref 13.0–17.0)
MCH: 33.3 pg (ref 26.0–34.0)
MCHC: 33.1 g/dL (ref 30.0–36.0)
MCV: 100.8 fL — ABNORMAL HIGH (ref 80.0–100.0)
Platelets: 305 10*3/uL (ref 150–400)
RBC: 2.58 MIL/uL — ABNORMAL LOW (ref 4.22–5.81)
RDW: 20.9 % — ABNORMAL HIGH (ref 11.5–15.5)
WBC: 5 10*3/uL (ref 4.0–10.5)
nRBC: 0 % (ref 0.0–0.2)

## 2020-04-10 LAB — BASIC METABOLIC PANEL
Anion gap: 14 (ref 5–15)
BUN: 11 mg/dL (ref 8–23)
CO2: 20 mmol/L — ABNORMAL LOW (ref 22–32)
Calcium: 8.8 mg/dL — ABNORMAL LOW (ref 8.9–10.3)
Chloride: 95 mmol/L — ABNORMAL LOW (ref 98–111)
Creatinine, Ser: 1.01 mg/dL (ref 0.61–1.24)
GFR calc Af Amer: >60 mL/min (ref >60)
GFR calc non Af Amer: >60 mL/min (ref >60)
Glucose, Bld: 124 mg/dL — ABNORMAL HIGH (ref 70–99)
Potassium: 4.6 mmol/L (ref 3.5–5.1)
Sodium: 129 mmol/L — ABNORMAL LOW (ref 135–145)

## 2020-04-10 LAB — MAGNESIUM: Magnesium: 1.8 mg/dL (ref 1.7–2.4)

## 2020-04-10 SURGERY — ESOPHAGOGASTRODUODENOSCOPY (EGD) WITH PROPOFOL
Anesthesia: General

## 2020-04-10 SURGERY — EGD (ESOPHAGOGASTRODUODENOSCOPY)
Anesthesia: General

## 2020-04-10 MED ORDER — FOLIC ACID 1 MG PO TABS
1.0000 mg | ORAL_TABLET | Freq: Every day | ORAL | Status: DC
  Administered 2020-04-11: 1 mg via ORAL
  Filled 2020-04-10: qty 1

## 2020-04-10 MED ORDER — HYDRALAZINE HCL 20 MG/ML IJ SOLN
5.0000 mg | INTRAMUSCULAR | Status: AC | PRN
  Administered 2020-04-10 (×2): 5 mg via INTRAVENOUS
  Filled 2020-04-10 (×2): qty 1

## 2020-04-10 MED ORDER — LIDOCAINE HCL (CARDIAC) PF 100 MG/5ML IV SOSY
PREFILLED_SYRINGE | INTRAVENOUS | Status: DC | PRN
  Administered 2020-04-10: 50 mg via INTRAVENOUS

## 2020-04-10 MED ORDER — GLYCOPYRROLATE 0.2 MG/ML IJ SOLN
INTRAMUSCULAR | Status: DC | PRN
  Administered 2020-04-10: .1 mg via INTRAVENOUS

## 2020-04-10 MED ORDER — PROPOFOL 500 MG/50ML IV EMUL
INTRAVENOUS | Status: DC | PRN
  Administered 2020-04-10: 200 ug/kg/min via INTRAVENOUS

## 2020-04-10 MED ORDER — SODIUM CHLORIDE 0.9 % IV SOLN: INTRAVENOUS | Status: DC | PRN

## 2020-04-10 MED ORDER — PROPOFOL 10 MG/ML IV BOLUS
INTRAVENOUS | Status: DC | PRN
  Administered 2020-04-10: 60 mg via INTRAVENOUS
  Administered 2020-04-10: 20 mg via INTRAVENOUS

## 2020-04-10 MED ORDER — AMLODIPINE BESYLATE 10 MG PO TABS
10.0000 mg | ORAL_TABLET | Freq: Every day | ORAL | Status: DC
  Administered 2020-04-11: 10 mg via ORAL
  Filled 2020-04-10: qty 1

## 2020-04-10 MED ORDER — PROPOFOL 500 MG/50ML IV EMUL
INTRAVENOUS | Status: AC
  Filled 2020-04-10: qty 50

## 2020-04-10 NOTE — Progress Notes (Signed)
PROGRESS NOTE    Troy Lopez  NIO:[id number] DOB: [birth year] DOA: 04/09/2020 PCP: Einar Pheasant, MD    Assessment & Plan:   Principal Problem:   Cellulitis of right foot Active Problems:   CAD (coronary artery disease)   Hypertension   Moderate COPD (chronic obstructive pulmonary disease) ([place])   Atrial flutter (HCC)    T[place] Lopez is a 79 y.o. male with medical history significant for CAD, paroxysmal a flutter, COPD, who presented to podiatry on 04/05/2020 with an ulcer of the right second toe which was debrided and he was sent home on doxycycline who presents to the emergency room with complaints of increased redness and swelling of the toe now with pain swelling and redness of the right foot no advance into the ankle and lower leg.  He denies fever or chills.   Cellulitis of right foot following debridement right second toe ulcer Dislocation right second toe PIPJ  -Patient was seen by podiatry on 04/05/2020 when he underwent debridement.  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 PLAN: --continue vanc and ceftriaxone for now --Podiatry to change dressing tomorrow     CAD (coronary artery disease) -continue home metop    Hypertension --BP elevated on home metop and lasix --continue metop and lasix --add amlodipine 10 mg daily (new)    Moderate COPD (chronic obstructive pulmonary disease) ([place]) --continue home Breo and INcruse    Atrial flutter ([place]) -Patient was off eliquis for an upcoming GI procedure PLAN: --continue home metop --resume eliquis tomorrow  Hx of iron def anemia 2/2 chronic blood loss Hx of AVM of stomach and small intestine --EGD today as per original  outpatient schedule --found 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 Treated with argon plasma coagulation (APC). - found A single non-bleeding angioectasia in the duodenum. Treated with argon plasma coagulation (APC). --follow up with outpatient GI in 3 months  Folate def --start folate suppl   DVT prophylaxis: Lovenox SQ Code Status: Full code  Family Communication:  Status is: inpatient Dispo:   The patient is from: home Anticipated d/c is to: home Anticipated d/c date is: tomorrow Patient currently is not medically stable to d/c due to: need wound check and podiatry clearance   Subjective and Interval History:  Had amputation of right 2nd toe yesterday.   Going for EGD today that was originally scheduled for outpatient.  Tolerated both well.  No abdominal pain.  Poor appetite.    Objective: Vitals:   04/10/20 1102 04/10/20 1222 04/10/20 1232 04/10/20 1242  BP: (!) 165/76 109/66 120/70 132/71  Pulse: 68 68 70 65  Resp: 16 11 17  (!) 22  Temp: 98 F (36.7 C) 98 F (36.7 C)    TempSrc: Oral Tympanic    SpO2: 97% 91% 98% 96%  Weight:      Height:        Intake/Output Summary (Last 24 hours) at 04/10/2020 1522 Last data filed at 04/10/2020 1223 Gross per 24 hour  Intake 899.08 ml  Output 950 ml  Net -50.92 ml   Filed Weights   04/08/20 1214  Weight: 80.3 kg    Examination:   Constitutional: NAD, AAOx3 HEENT: conjunctivae and lids normal, EOMI CV: RRR no M,R,G. Distal pulses +  2.  No cyanosis.   RESP: CTA B/L, normal respiratory effort  GI: +BS, NTND Extremities: right toes wrapped in dressing SKIN: warm, dry  Neuro: II - XII grossly intact.  Sensation intact Psych: Normal mood and affect.  Appropriate judgement and reason    Data Reviewed: I have personally reviewed following labs and imaging studies  CBC: Recent Labs  Lab 04/07/20 1345 04/08/20 1224 04/10/20 0528  WBC 7.5 6.6 5.0  NEUTROABS 5.3 4.8  --   HGB 9.6* 9.8* 8.6*  HCT 29.4*  31.1* 26.0*  MCV 101.7* 105.4* 100.8*  PLT 321 336 433   Basic Metabolic Panel: Recent Labs  Lab 04/08/20 1224 04/10/20 0528  NA 126* 129*  K 4.8 4.6  CL 91* 95*  CO2 24 20*  GLUCOSE 79 124*  BUN 18 11  CREATININE 0.96 1.01  CALCIUM 9.6 8.8*  MG  --  1.8   GFR: Estimated Creatinine Clearance: 62.2 mL/min (by C-G formula based on SCr of 1.01 mg/dL). Liver Function Tests: Recent Labs  Lab 04/08/20 1224  AST 17  ALT 11  ALKPHOS 80  BILITOT 0.9  PROT 8.3*  ALBUMIN 3.5   No results for input(s): LIPASE, AMYLASE in the last 168 hours. No results for input(s): AMMONIA in the last 168 hours. Coagulation Profile: No results for input(s): INR, PROTIME in the last 168 hours. Cardiac Enzymes: No results for input(s): CKTOTAL, CKMB, CKMBINDEX, TROPONINI in the last 168 hours. BNP (last 3 results) No results for input(s): PROBNP in the last 8760 hours. HbA1C: No results for input(s): HGBA1C in the last 72 hours. CBG: No results for input(s): GLUCAP in the last 168 hours. Lipid Profile: No results for input(s): CHOL, HDL, LDLCALC, TRIG, CHOLHDL, LDLDIRECT in the last 72 hours. Thyroid Function Tests: No results for input(s): TSH, T4TOTAL, FREET4, T3FREE, THYROIDAB in the last 72 hours. Anemia Panel: Recent Labs    04/08/20 1224  VITAMINB12 1,046*  FOLATE 4.9*   Sepsis Labs: Recent Labs  Lab 04/08/20 0147 04/08/20 0314 04/09/20 0644  LATICACIDVEN 2.0* 2.2* 1.3    Recent Results (from the past 240 hour(s))  SARS CORONAVIRUS 2 (TAT 6-24 HRS) Nasopharyngeal Nasopharyngeal Swab     Status: None   Collection Time: 04/07/20 10:50 AM   Specimen: Nasopharyngeal Swab  Result Value Ref Range Status   SARS Coronavirus 2 NEGATIVE NEGATIVE Final    Comment: (NOTE) SARS-CoV-2 target nucleic acids are NOT DETECTED.  The SARS-CoV-2 RNA is generally detectable in upper and lower respiratory specimens during the acute phase of infection. Negative results do not preclude  SARS-CoV-2 infection, do not rule out co-infections with other pathogens, and should not be used as the sole basis for treatment or other patient management decisions. Negative results must be combined with clinical observations, patient history, and epidemiological information. The expected result is Negative.  Fact Sheet for Patients: SugarRoll.be  Fact Sheet for Healthcare Providers: https://www.woods-mathews.com/  This test is not yet approved or cleared by the [place] FDA and  has been authorized for detection and/or diagnosis of SARS-CoV-2 by FDA under an Emergency Use Authorization (EUA). This EUA will remain  in effect (meaning this test can be used) for the duration of the COVID-19 declaration under Se ction 564(b)(1) of the Act, 21 U.S.C. section 360bbb-3(b)(1), unless the authorization is terminated or revoked sooner.  Performed at [facility], [place] [address], [place], [place] [address]   Culture, blood (routine x 2)     Status: None (Preliminary  result)   Collection Time: 04/08/20 12:20 PM   Specimen: BLOOD  Result Value Ref Range Status   Specimen Description BLOOD BLOOD LEFT FOREARM  Final   Special Requests   Final    BOTTLES DRAWN AEROBIC AND ANAEROBIC Blood Culture adequate volume   Culture   Final    NO GROWTH 1 DAY Performed at [facility], [address] [place], [place] [address]    Report Status PENDING  Incomplete  SARS Coronavirus 2 by RT PCR (hospital order, performed in [place] hospital lab) Nasopharyngeal Nasopharyngeal Swab     Status: None   Collection Time: 04/09/20  1:38 AM   Specimen: Nasopharyngeal Swab  Result Value Ref Range Status   SARS Coronavirus 2 NEGATIVE NEGATIVE Final    Comment: (NOTE) SARS-CoV-2 target nucleic acids are NOT DETECTED.  The SARS-CoV-2 RNA is generally detectable in upper and lower respiratory specimens during the acute phase of infection. The  lowest concentration of SARS-CoV-2 viral copies this assay can detect is 250 copies / mL. A negative result does not preclude SARS-CoV-2 infection and should not be used as the sole basis for treatment or other patient management decisions.  A negative result may occur with improper specimen collection / handling, submission of specimen other than nasopharyngeal swab, presence of viral mutation(s) within the areas targeted by this assay, and inadequate number of viral copies (<250 copies / mL). A negative result must be combined with clinical observations, patient history, and epidemiological information.  Fact Sheet for Patients:   StrictlyIdeas.no  Fact Sheet for Healthcare Providers: BankingDealers.co.za  This test is not yet approved or  cleared by the [place] FDA and has been authorized for detection and/or diagnosis of SARS-CoV-2 by FDA under an Emergency Use Authorization (EUA).  This EUA will remain in effect (meaning this test can be used) for the duration of the COVID-19 declaration under Section 564(b)(1) of the Act, 21 U.S.C. section 360bbb-3(b)(1), unless the authorization is terminated or revoked sooner.  Performed at [facility], [address] [place], [place] [address]   Culture, blood (routine x 2)     Status: None (Preliminary result)   Collection Time: 04/09/20  1:47 AM   Specimen: BLOOD  Result Value Ref Range Status   Specimen Description BLOOD BLOOD RIGHT FOREARM  Final   Special Requests   Final    BOTTLES DRAWN AEROBIC AND ANAEROBIC Blood Culture results may not be optimal due to an excessive volume of blood received in culture bottles   Culture   Final    NO GROWTH 1 DAY Performed at [facility], [address], [place], [place] [address]    Report Status PENDING  Incomplete  MRSA PCR Screening     Status: None   Collection Time: 04/09/20  6:02 PM   Specimen: Nasopharyngeal    Result Value Ref Range Status   MRSA by PCR NEGATIVE NEGATIVE Final    Comment:        The GeneXpert MRSA Assay (FDA approved for NASAL specimens only), is one component of a comprehensive MRSA colonization surveillance program. It is not intended to diagnose MRSA infection nor to guide or monitor treatment for MRSA infections. Performed at [facility], [address] [place],  [address]   Anaerobic culture     Status: None (Preliminary result)   Collection Time: 04/09/20  8:25 PM   Specimen: PATH Other  Result Value Ref Range Status   Specimen Description BONE  Final   Special Requests   Final    RIGHT SECOND TOE Performed at [facility], [place] [address], [place], [place] [address]    Gram Stain PENDING  Incomplete   Culture PENDING  Incomplete   Report Status PENDING  Incomplete  Aerobic Culture (superficial specimen)     Status: None (Preliminary result)   Collection Time: 04/09/20  8:25 PM   Specimen: PATH Other  Result Value Ref Range Status   Specimen Description BONE  Final   Special Requests RIGHT SECOND TOE  Final   Gram Stain PENDING  Incomplete   Culture   Final    NO GROWTH < 12 HOURS Performed at [facility], [place] [address] [place] [address], [place], [place] [address]    Report Status PENDING  Incomplete      Radiology Studies: MR FOOT RIGHT WO CONTRAST  Result Date: 04/09/2020 CLINICAL DATA:  History of a skin ulceration on the right second toe. The patient is status post debridement of the wound 04/05/2020. Continued redness, pain and swelling. EXAM: MRI OF THE RIGHT FOREFOOT WITHOUT CONTRAST TECHNIQUE: Multiplanar, multisequence MR imaging of the right forefoot was performed. No intravenous contrast was administered. COMPARISON:  Plain films right foot 04/09/2020. FINDINGS: Bones/Joint/Cartilage There is no marrow edema to suggest osteomyelitis. The patient has remote healed fractures of the distal first and proximal second metatarsals. No  acute fracture is identified. The PIP joint of the second toe is laterally subluxed as seen on the prior plain films. Hallux valgus deformity is noted. 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 Ligaments The medial collateral ligament of the PIP joint of the second toe is difficult to visualize and may be torn. Otherwise negative. Muscles and Tendons No intramuscular fluid collection, tear or strain. Fatty atrophy of intrinsic musculature the foot noted. Soft tissues No focal fluid collection. Subcutaneous edema is seen in the dorsum of the foot. IMPRESSION: Negative for osteomyelitis, abscess or septic joint. Lateral subluxation PIP joint of the second toe. The medial collateral ligament of the PIP joint is not visible and may be torn. Remote healed first and second metatarsal fractures. Hallux valgus and first MTP osteoarthritis. Subcutaneous edema in the dorsum of the foot could be due to dependent change or cellulitis. Electronically Signed   By: Inge Rise M.D.   On: 04/09/2020 14:21   DG Foot Complete Right  Result Date: 04/09/2020 CLINICAL DATA:  Foot swelling, evaluate for osteomyelitis EXAM: RIGHT FOOT COMPLETE - 3+ VIEW COMPARISON:  Radiograph 10/09/2017 FINDINGS: Diffuse circumferential swelling of the foot. Questionable ulceration along the plantar and lateral aspect adjacent the head of the fifth metatarsal. No other radiographically evident sites of ulceration are evident. No soft tissue gas or foreign body is seen. No osseous erosion, destructive change or periostitis to suggest osteomyelitis at this location or elsewhere within the foot at this time. There are posttraumatic deformities of the first metatarsal and second metatarsal base. There is a volar dislocation of the second proximal interphalangeal joint. 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 Hallux valgus deformity  with chronic soft tissue thickening along the medial head of the first metatarsal. Vascular calcium in the soft tissues. IMPRESSION: 1. Questionable ulceration along the plantar and lateral aspect adjacent the head of the fifth metatarsal. Correlate with visual inspection. 2. No other radiographically evident sites of ulceration within the foot at this time. 3.  No radiographic evidence of osteomyelitis. If there is persisting clinical concern, MRI imaging is much more sensitive and specific for early features of osteomyelitis. 4. 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 5. Volar dislocation of the second proximal interphalangeal joint, new from prior. 6. Remote posttraumatic deformity of the first and second metatarsals. 7. Hallux valgus. Electronically Signed   By: Lovena Le M.D.   On: 04/09/2020 02:14     Scheduled Meds: . amLODipine  10 mg Oral Daily  . enoxaparin (LOVENOX) injection  40 mg Subcutaneous Q24H  . fluticasone furoate-vilanterol  1 puff Inhalation Daily  . [START ON 3/90/3009] folic acid  1 mg Oral Daily  . furosemide  20 mg Oral BID  . metoprolol tartrate  100 mg Oral BID  . pantoprazole  40 mg Oral BID  . sertraline  50 mg Oral Daily  . umeclidinium bromide  1 puff Inhalation Daily   Continuous Infusions: . cefTRIAXone (ROCEPHIN)  IV 1 g (04/10/20 0422)  . vancomycin 1,000 mg (04/10/20 0538)     LOS: 1 day     [name], MD [facility] If 7PM-7AM, please contact night-coverage 04/10/2020, 3:22 PM

## 2020-04-10 NOTE — Transfer of Care (Signed)
Immediate Anesthesia Transfer of Care Note  Patient: [place] Lopez  Procedure(s) Performed: ESOPHAGOGASTRODUODENOSCOPY (EGD) (N/A )  Patient Location: PACU and Endoscopy Unit  Anesthesia Type:General  Level of Consciousness: drowsy, patient cooperative and responds to stimulation  Airway & Oxygen Therapy: Patient Spontanous Breathing and Patient connected to nasal cannula oxygen  Post-op Assessment: Report given to RN and Post -op Vital signs reviewed and stable  Post vital signs: Reviewed and stable  Last Vitals:  Vitals Value Taken Time  BP 109/66 04/10/20 1222  Temp    Pulse 67 04/10/20 1222  Resp 11 04/10/20 1222  SpO2 91 % 04/10/20 1222  Vitals shown include unvalidated device data.  Last Pain:  Vitals:   04/10/20 1102  TempSrc: Oral  PainSc:       Patients Stated Pain Goal: 0 (94/50/[id number])  Complications: No complications documented.

## 2020-04-10 NOTE — Progress Notes (Signed)
Patient BP has been elevated. MD notified. PRN hydralazine ordered. BP 175/84. Medication given. Will continue to monitor.

## 2020-04-10 NOTE — Consult Note (Signed)
[facility] GI Inpatient Consult Note   Kathline Magic, M.D.  Reason for Consult: Anemia secondary to chronic blood loss, history of gastric AVM, duodenal AVM   Attending Requesting Consult: [name], MD  Outpatient Primary Physician: Einar Pheasant, MD  History of Present Illness: Troy Lopez is a 79 y.o. 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 Gustavo Lah.  Patient denies any melena, hematochezia, or symptomatic anemia such as dizziness or presyncopal episodes.  He was admitted to the hospital 2 days ago for elective amputation of the toe by Dr. Cleda Mccreedy.  Patient was scheduled to have his endoscopy today as an outpatient, but would like to do it while he is an inpatient here so he does not miss his scheduled day for the procedure  Past Medical History:  Past Medical History:  Diagnosis Date  . Adrenal gland anomaly    enlargement  . Anemia    IDA  . Anxiety   . CAD (coronary artery disease)   . Carpal tunnel syndrome   . Chronic back pain   . Chronic hyponatremia   . Colitis   . Colonic polyp   . COPD (chronic obstructive pulmonary disease) ([place])   . Degenerative disc disease, lumbar   . Depression   . Diverticulosis   . Dyspnea   . Gastric AVM   . GERD (gastroesophageal reflux disease)   . H/O degenerative disc disease   . Hypercholesterolemia   . Hyperkalemia   . Hyperlipidemia   . Hypertension   . Irritable bowel syndrome   . Monoclonal gammopathy   . Monoclonal gammopathy   . Neuropathy   . Personal history of tobacco use, presenting hazards to health 08/17/2015  . Sleep apnea   . Vertebral compression fracture [facility])     Problem List: Patient Active Problem List   Diagnosis Date Noted  . Cellulitis of right foot 04/09/2020  . Renal lesion 03/12/2020  . Post-nasal drip 11/24/2019  . Skin nodule 09/28/2019  . Scalp lesion 06/20/2019  . Swelling of lower extremity 03/15/2019  . Senile osteoporosis  02/24/2019  . Neuropathy 01/01/2019  . Macrocytic anemia 12/22/2018  . Other fatigue 12/22/2018  . ILD (interstitial lung disease) ([place]) 10/06/2018  . Peripheral edema 06/10/2018  . Lumbar radiculopathy (Right L1/2) 05/21/2018  . Lumbar degenerative disc disease 05/21/2018  . Lumbar foraminal stenosis 05/21/2018  . Chronic pain syndrome 05/21/2018  . Syncope 03/05/2018  . Constipation 10/29/2017  . Fall 10/29/2017  . Rib pain on right side 10/29/2017  . Pulmonary hypertension ([place]) 05/02/2017  . Atrial flutter ([place]) 05/02/2017  . Thoracic aortic aneurysm ([place]) 04/29/2017  . Abnormal feces   . Rectal polyp   . Chest pain 03/03/2017  . Blood in stool   . 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 Alcohol abuse 01/30/2017  . History of compression fracture of spine 12/08/2016  . Moderate COPD (chronic obstructive pulmonary disease) ([place]) 07/22/2016  . COPD exacerbation ([place]) 07/22/2016  . Mild depression ([place]) 07/21/2016  . Skin lesion 01/21/2016  . Nasal congestion 09/28/2015  . Sleep difficulties 09/26/2015  . Personal history of tobacco use, presenting hazards to health 08/17/2015  . Health care maintenance 10/23/2014  . Collagenous colitis 09/16/2014  . Leg pain 03/20/2014  . Hyperlipemia 12/22/2013  . Shortness of breath 11/21/2013  . Ulcer 02/02/2013  . Chronic back pain 02/02/2013  . GERD (gastroesophageal reflux disease) 09/13/2012  . Sleep apnea 09/13/2012  . Iron  deficiency anemia 09/08/2012  . 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 CAD (coronary artery disease) 09/08/2012  . Hypertension 09/08/2012  . Hyponatremia 09/08/2012    Past Surgical History: Past Surgical History:  Procedure Laterality Date  . BUNIONECTOMY  1989  . CARDIAC CATHETERIZATION    . CARPAL TUNNEL RELEASE Left 2011   ulnar nerve sub muscular at elbow  . CHOLECYSTECTOMY  09/07  . COLONOSCOPY WITH PROPOFOL N/A 03/05/2017   Procedure: COLONOSCOPY WITH  PROPOFOL;  Surgeon: Lucilla Lame, MD;  Location: [facility] ENDOSCOPY;  Service: Endoscopy;  Laterality: N/A;  . ELECTROPHYSIOLOGIC STUDY N/A 11/15/2015   Procedure: CARDIOVERSION;  Surgeon: Isaias Cowman, MD;  Location: [facility] ORS;  Service: Cardiovascular;  Laterality: N/A;  . ESOPHAGOGASTRODUODENOSCOPY (EGD) WITH PROPOFOL N/A 03/05/2017   Procedure: ESOPHAGOGASTRODUODENOSCOPY (EGD) WITH PROPOFOL;  Surgeon: Lucilla Lame, MD;  Location: [facility] ENDOSCOPY;  Service: Endoscopy;  Laterality: N/A;  . ESOPHAGOGASTRODUODENOSCOPY (EGD) WITH PROPOFOL N/A 10/17/2017   Procedure: ESOPHAGOGASTRODUODENOSCOPY (EGD) WITH PROPOFOL;  Surgeon: Lollie Sails, MD;  Location: [facility] ENDOSCOPY;  Service: Endoscopy;  Laterality: N/A;  . EYE SURGERY Bilateral 2010   cataract  . HEMORRHOID SURGERY    . KYPHOPLASTY N/A 11/04/2016   Procedure: KYPHOPLASTY T 12;  Surgeon: Hessie Knows, MD;  Location: [facility] ORS;  Service: Orthopedics;  Laterality: N/A;    Allergies: Allergies  Allergen Reactions  . Iodinated Diagnostic Agents Other (See Comments)    Contraindication secondary to IGMgammopathy/Waldren's syndrome   Not to be given due to Waldenstrom's syndrome, told it could affect kidney function    Home Medications: Medications Prior to Admission  Medication Sig Dispense Refill Last Dose  . acetaminophen (TYLENOL) 500 MG tablet Take 500-1,000 mg by mouth 3 (three) times daily as needed for moderate pain or headache.   Past Month at PRN  . albuterol (VENTOLIN HFA) 108 (90 Base) MCG/ACT inhaler Inhale 2 puffs into the lungs every 6 (six) hours as needed for wheezing or shortness of breath. 18 g 11 Past Month at PRN  . azelastine (ASTELIN) 0.1 % nasal spray Place 1 spray into both nostrils 2 (two) times daily. Use in each nostril as directed 30 mL 1 Past Month at PRN  . budesonide (ENTOCORT EC) 3 MG 24 hr capsule Take 9 mg by mouth daily as needed (colitis flare).    04/08/2020 at Unknown time  . calcium-vitamin D (OSCAL WITH  D) 500-200 MG-UNIT tablet Take 2 tablets by mouth daily.   04/08/2020 at Unknown time  . doxycycline (VIBRAMYCIN) 100 MG capsule Take 100 mg by mouth 2 (two) times daily.   04/08/2020 at Unknown time  . FIBER PO Take 1 capsule by mouth daily.    04/08/2020 at Unknown time  . fluticasone (FLONASE) 50 MCG/ACT nasal spray Place 1 spray into both nostrils daily. 16 g 2 04/08/2020 at Unknown time  . Fluticasone-Umeclidin-Vilant (TRELEGY ELLIPTA) 100-62.5-25 MCG/INH AEPB Inhale 1 puff into the lungs daily. 1 each 11 04/08/2020 at Unknown time  . furosemide (LASIX) 20 MG tablet Take 20 mg by mouth 2 (two) times daily.   04/08/2020 at Unknown time  . gabapentin (NEURONTIN) 100 MG capsule :2 Capsule(s) By Mouth Every 3 Hours PRN   04/08/2020 at Unknown time  . metoprolol tartrate (LOPRESSOR) 100 MG tablet Take 1 tablet (100 mg total) by mouth 2 (two) times daily. 60 tablet 0 04/08/2020 at Unknown time  . pantoprazole (PROTONIX) 40 MG tablet Take 1 tablet (40 mg total) by mouth 2 (two) times daily.  180 tablet 0 04/08/2020 at Unknown time  . sertraline (ZOLOFT) 50 MG tablet Take 1 tablet (50 mg total) by mouth daily. 30 tablet 0 04/08/2020 at Unknown time  . triamcinolone ointment (KENALOG) 0.1 %    Past Week at PRN  . vitamin B-12 (CYANOCOBALAMIN) 1000 MCG tablet Take 1,000 mcg by mouth daily.   04/08/2020 at Unknown time  . ELIQUIS 5 MG TABS tablet Take 5 mg by mouth 2 (two) times daily.    04/07/2020  . isosorbide mononitrate (IMDUR) 30 MG 24 hr tablet Take 1 tablet (30 mg total) by mouth once daily. (Patient not taking: Reported on 03/10/2020)   03/09/2020   Home medication reconciliation was completed with the patient.   Scheduled Inpatient Medications:   . [MAR Hold] amLODipine  10 mg Oral Daily  . [MAR Hold] enoxaparin (LOVENOX) injection  40 mg Subcutaneous Q24H  . [MAR Hold] fluticasone furoate-vilanterol  1 puff Inhalation Daily  . [MAR Hold] folic acid  1 mg Oral Daily  . [MAR Hold] furosemide  20 mg Oral  BID  . [MAR Hold] metoprolol tartrate  100 mg Oral BID  . [MAR Hold] pantoprazole  40 mg Oral BID  . [MAR Hold] sertraline  50 mg Oral Daily  . [MAR Hold] umeclidinium bromide  1 puff Inhalation Daily    Continuous Inpatient Infusions:   . [MAR Hold] cefTRIAXone (ROCEPHIN)  IV 1 g (04/10/20 0422)  . [MAR Hold] vancomycin 1,000 mg (04/10/20 0538)    PRN Inpatient Medications:  [MAR Hold] acetaminophen **OR** [MAR Hold] acetaminophen, [MAR Hold] hydrALAZINE, [MAR Hold]  morphine injection, [MAR Hold] ondansetron **OR** [MAR Hold] ondansetron (ZOFRAN) IV, [MAR Hold] oxyCODONE-acetaminophen  Family History: family history includes Heart disease in his father; Stroke in his mother.   GI Family History:  Negative  Social History:   reports that he quit smoking about 4 years ago. His smoking use included cigarettes. He started smoking about 62 years ago. He has a 116.00 pack-year smoking history. He has never used smokeless tobacco. He reports current alcohol use of about 42.0 standard drinks of alcohol per week. He reports that he does not use drugs. The patient denies ETOH, tobacco, or drug use.    Review of Systems: Review of Systems - Negative except History of present illness  Physical Examination: BP (!) 165/76 (BP Location: Left Arm)   Pulse 68   Temp 98 F (36.7 C) (Oral)   Resp 16   Ht 5\' 10"  (1.778 m)   Wt 80.3 kg   SpO2 97%   BMI 25.40 kg/m  Physical Exam Vitals reviewed.  Constitutional:      Appearance: He is normal weight.  HENT:     Head: Normocephalic and atraumatic.     Nose: Nose normal.     Mouth/Throat:     Mouth: Mucous membranes are moist.  Eyes:     Extraocular Movements: Extraocular movements intact.     Pupils: Pupils are equal, round, and reactive to light.  Cardiovascular:     Rate and Rhythm: Normal rate.     Pulses: Normal pulses.  Pulmonary:     Effort: Pulmonary effort is normal.     Breath sounds: Normal breath sounds.  Abdominal:      General: Abdomen is flat.     Palpations: Abdomen is soft.  Musculoskeletal:        General: Normal range of motion.     Cervical back: Normal range of motion.  Skin:  General: Skin is warm and dry.  Neurological:     General: No focal deficit present.     Mental Status: He is alert.  Psychiatric:        Mood and Affect: Mood normal.     Data: Lab Results  Component Value Date   WBC 5.0 04/10/2020   HGB 8.6 (L) 04/10/2020   HCT 26.0 (L) 04/10/2020   MCV 100.8 (H) 04/10/2020   PLT 305 04/10/2020   Recent Labs  Lab 04/07/20 1345 04/08/20 1224 04/10/20 0528  HGB 9.6* 9.8* 8.6*   Lab Results  Component Value Date   NA 129 (L) 04/10/2020   K 4.6 04/10/2020   CL 95 (L) 04/10/2020   CO2 20 (L) 04/10/2020   BUN 11 04/10/2020   CREATININE 1.01 04/10/2020   GLU 93 01/30/2012   Lab Results  Component Value Date   ALT 11 04/08/2020   AST 17 04/08/2020   ALKPHOS 80 04/08/2020   BILITOT 0.9 04/08/2020   No results for input(s): APTT, INR, PTT in the last 168 hours. CBC Latest Ref Rng & Units 04/10/2020 04/08/2020 04/07/2020  WBC 4.0 - 10.5 K/uL 5.0 6.6 7.5  Hemoglobin 13.0 - 17.0 g/dL 8.6(L) 9.8(L) 9.6(L)  Hematocrit 39 - 52 % 26.0(L) 31.1(L) 29.4(L)  Platelets 150 - 400 K/uL 305 336 321    STUDIES: MR FOOT RIGHT WO CONTRAST  Result Date: 04/09/2020 CLINICAL DATA:  History of a skin ulceration on the right second toe. The patient is status post debridement of the wound 04/05/2020. Continued redness, pain and swelling. EXAM: MRI OF THE RIGHT FOREFOOT WITHOUT CONTRAST TECHNIQUE: Multiplanar, multisequence MR imaging of the right forefoot was performed. No intravenous contrast was administered. COMPARISON:  Plain films right foot 04/09/2020. FINDINGS: Bones/Joint/Cartilage There is no marrow edema to suggest osteomyelitis. The patient has remote healed fractures of the distal first and proximal second metatarsals. No acute fracture is identified. The PIP joint of the second  toe is laterally subluxed as seen on the prior plain films. Hallux valgus deformity is noted. 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 Ligaments The medial collateral ligament of the PIP joint of the second toe is difficult to visualize and may be torn. Otherwise negative. Muscles and Tendons No intramuscular fluid collection, tear or strain. Fatty atrophy of intrinsic musculature the foot noted. Soft tissues No focal fluid collection. Subcutaneous edema is seen in the dorsum of the foot. IMPRESSION: Negative for osteomyelitis, abscess or septic joint. Lateral subluxation PIP joint of the second toe. The medial collateral ligament of the PIP joint is not visible and may be torn. Remote healed first and second metatarsal fractures. Hallux valgus and first MTP osteoarthritis. Subcutaneous edema in the dorsum of the foot could be due to dependent change or cellulitis. Electronically Signed   By: Inge Rise M.D.   On: 04/09/2020 14:21   DG Foot Complete Right  Result Date: 04/09/2020 CLINICAL DATA:  Foot swelling, evaluate for osteomyelitis EXAM: RIGHT FOOT COMPLETE - 3+ VIEW COMPARISON:  Radiograph 10/09/2017 FINDINGS: Diffuse circumferential swelling of the foot. Questionable ulceration along the plantar and lateral aspect adjacent the head of the fifth metatarsal. No other radiographically evident sites of ulceration are evident. No soft tissue gas or foreign body is seen. No osseous erosion, destructive change or periostitis to suggest osteomyelitis at this location or elsewhere within the foot at this time. There are posttraumatic deformities of the first metatarsal and second metatarsal base. There is a volar dislocation of the second proximal  interphalangeal joint. 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 Hallux valgus deformity with chronic soft tissue thickening along the medial head  of the first metatarsal. Vascular calcium in the soft tissues. IMPRESSION: 1. Questionable ulceration along the plantar and lateral aspect adjacent the head of the fifth metatarsal. Correlate with visual inspection. 2. No other radiographically evident sites of ulceration within the foot at this time. 3. No radiographic evidence of osteomyelitis. If there is persisting clinical concern, MRI imaging is much more sensitive and specific for early features of osteomyelitis. 4. 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 5. Volar dislocation of the second proximal interphalangeal joint, new from prior. 6. Remote posttraumatic deformity of the first and second metatarsals. 7. Hallux valgus. Electronically Signed   By: Lovena Le M.D.   On: 04/09/2020 02:14   @IMAGES @  Assessment: 1 anemia secondary to gastrointestinal blood loss. 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 3.  Status post amputation of toe. 4. Chronic anticoagulation held since Friday.   COVID-19 status: Tested negative     Recommendations: 1. EGD. The patient understands the nature of the planned procedure, indications, risks, alternatives and potential complications including but not limited to bleeding, infection, perforation, damage to internal organs and possible oversedation/side effects from anesthesia. The patient agrees and gives consent to proceed.  Please refer to procedure notes for findings, recommendations and patient disposition/instructions.   Thank you for the consult. Please call with questions or concerns.  Olean Ree, "Lanny Hurst MD [facility] [place], [place] [address] [phone]  04/10/2020 11:56 AM

## 2020-04-10 NOTE — H&P (View-Only) (Signed)
[facility] GI Inpatient Consult Note   Kathline Magic, M.D.  Reason for Consult: Anemia secondary to chronic blood loss, history of gastric AVM, duodenal AVM   Attending Requesting Consult: [name], MD  Outpatient Primary Physician: Einar Pheasant, MD  History of Present Illness: Troy Lopez is a 79 y.o. 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 Gustavo Lah.  Patient denies any melena, hematochezia, or symptomatic anemia such as dizziness or presyncopal episodes.  He was admitted to the hospital 2 days ago for elective amputation of the toe by Dr. Cleda Mccreedy.  Patient was scheduled to have his endoscopy today as an outpatient, but would like to do it while he is an inpatient here so he does not miss his scheduled day for the procedure  Past Medical History:  Past Medical History:  Diagnosis Date  . Adrenal gland anomaly    enlargement  . Anemia    IDA  . Anxiety   . CAD (coronary artery disease)   . Carpal tunnel syndrome   . Chronic back pain   . Chronic hyponatremia   . Colitis   . Colonic polyp   . COPD (chronic obstructive pulmonary disease) ([place])   . Degenerative disc disease, lumbar   . Depression   . Diverticulosis   . Dyspnea   . Gastric AVM   . GERD (gastroesophageal reflux disease)   . H/O degenerative disc disease   . Hypercholesterolemia   . Hyperkalemia   . Hyperlipidemia   . Hypertension   . Irritable bowel syndrome   . Monoclonal gammopathy   . Monoclonal gammopathy   . Neuropathy   . Personal history of tobacco use, presenting hazards to health 08/17/2015  . Sleep apnea   . Vertebral compression fracture [facility])     Problem List: Patient Active Problem List   Diagnosis Date Noted  . Cellulitis of right foot 04/09/2020  . Renal lesion 03/12/2020  . Post-nasal drip 11/24/2019  . Skin nodule 09/28/2019  . Scalp lesion 06/20/2019  . Swelling of lower extremity 03/15/2019  . Senile osteoporosis  02/24/2019  . Neuropathy 01/01/2019  . Macrocytic anemia 12/22/2018  . Other fatigue 12/22/2018  . ILD (interstitial lung disease) ([place]) 10/06/2018  . Peripheral edema 06/10/2018  . Lumbar radiculopathy (Right L1/2) 05/21/2018  . Lumbar degenerative disc disease 05/21/2018  . Lumbar foraminal stenosis 05/21/2018  . Chronic pain syndrome 05/21/2018  . Syncope 03/05/2018  . Constipation 10/29/2017  . Fall 10/29/2017  . Rib pain on right side 10/29/2017  . Pulmonary hypertension ([place]) 05/02/2017  . Atrial flutter ([place]) 05/02/2017  . Thoracic aortic aneurysm ([place]) 04/29/2017  . Abnormal feces   . Rectal polyp   . Chest pain 03/03/2017  . Blood in stool   . 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 Alcohol abuse 01/30/2017  . History of compression fracture of spine 12/08/2016  . Moderate COPD (chronic obstructive pulmonary disease) ([place]) 07/22/2016  . COPD exacerbation ([place]) 07/22/2016  . Mild depression ([place]) 07/21/2016  . Skin lesion 01/21/2016  . Nasal congestion 09/28/2015  . Sleep difficulties 09/26/2015  . Personal history of tobacco use, presenting hazards to health 08/17/2015  . Health care maintenance 10/23/2014  . Collagenous colitis 09/16/2014  . Leg pain 03/20/2014  . Hyperlipemia 12/22/2013  . Shortness of breath 11/21/2013  . Ulcer 02/02/2013  . Chronic back pain 02/02/2013  . GERD (gastroesophageal reflux disease) 09/13/2012  . Sleep apnea 09/13/2012  . Iron  deficiency anemia 09/08/2012  . 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 CAD (coronary artery disease) 09/08/2012  . Hypertension 09/08/2012  . Hyponatremia 09/08/2012    Past Surgical History: Past Surgical History:  Procedure Laterality Date  . BUNIONECTOMY  1989  . CARDIAC CATHETERIZATION    . CARPAL TUNNEL RELEASE Left 2011   ulnar nerve sub muscular at elbow  . CHOLECYSTECTOMY  09/07  . COLONOSCOPY WITH PROPOFOL N/A 03/05/2017   Procedure: COLONOSCOPY WITH  PROPOFOL;  Surgeon: Lucilla Lame, MD;  Location: [facility] ENDOSCOPY;  Service: Endoscopy;  Laterality: N/A;  . ELECTROPHYSIOLOGIC STUDY N/A 11/15/2015   Procedure: CARDIOVERSION;  Surgeon: Isaias Cowman, MD;  Location: [facility] ORS;  Service: Cardiovascular;  Laterality: N/A;  . ESOPHAGOGASTRODUODENOSCOPY (EGD) WITH PROPOFOL N/A 03/05/2017   Procedure: ESOPHAGOGASTRODUODENOSCOPY (EGD) WITH PROPOFOL;  Surgeon: Lucilla Lame, MD;  Location: [facility] ENDOSCOPY;  Service: Endoscopy;  Laterality: N/A;  . ESOPHAGOGASTRODUODENOSCOPY (EGD) WITH PROPOFOL N/A 10/17/2017   Procedure: ESOPHAGOGASTRODUODENOSCOPY (EGD) WITH PROPOFOL;  Surgeon: Lollie Sails, MD;  Location: [facility] ENDOSCOPY;  Service: Endoscopy;  Laterality: N/A;  . EYE SURGERY Bilateral 2010   cataract  . HEMORRHOID SURGERY    . KYPHOPLASTY N/A 11/04/2016   Procedure: KYPHOPLASTY T 12;  Surgeon: Hessie Knows, MD;  Location: [facility] ORS;  Service: Orthopedics;  Laterality: N/A;    Allergies: Allergies  Allergen Reactions  . Iodinated Diagnostic Agents Other (See Comments)    Contraindication secondary to IGMgammopathy/Waldren's syndrome   Not to be given due to Waldenstrom's syndrome, told it could affect kidney function    Home Medications: Medications Prior to Admission  Medication Sig Dispense Refill Last Dose  . acetaminophen (TYLENOL) 500 MG tablet Take 500-1,000 mg by mouth 3 (three) times daily as needed for moderate pain or headache.   Past Month at PRN  . albuterol (VENTOLIN HFA) 108 (90 Base) MCG/ACT inhaler Inhale 2 puffs into the lungs every 6 (six) hours as needed for wheezing or shortness of breath. 18 g 11 Past Month at PRN  . azelastine (ASTELIN) 0.1 % nasal spray Place 1 spray into both nostrils 2 (two) times daily. Use in each nostril as directed 30 mL 1 Past Month at PRN  . budesonide (ENTOCORT EC) 3 MG 24 hr capsule Take 9 mg by mouth daily as needed (colitis flare).    04/08/2020 at Unknown time  . calcium-vitamin D (OSCAL WITH  D) 500-200 MG-UNIT tablet Take 2 tablets by mouth daily.   04/08/2020 at Unknown time  . doxycycline (VIBRAMYCIN) 100 MG capsule Take 100 mg by mouth 2 (two) times daily.   04/08/2020 at Unknown time  . FIBER PO Take 1 capsule by mouth daily.    04/08/2020 at Unknown time  . fluticasone (FLONASE) 50 MCG/ACT nasal spray Place 1 spray into both nostrils daily. 16 g 2 04/08/2020 at Unknown time  . Fluticasone-Umeclidin-Vilant (TRELEGY ELLIPTA) 100-62.5-25 MCG/INH AEPB Inhale 1 puff into the lungs daily. 1 each 11 04/08/2020 at Unknown time  . furosemide (LASIX) 20 MG tablet Take 20 mg by mouth 2 (two) times daily.   04/08/2020 at Unknown time  . gabapentin (NEURONTIN) 100 MG capsule :2 Capsule(s) By Mouth Every 3 Hours PRN   04/08/2020 at Unknown time  . metoprolol tartrate (LOPRESSOR) 100 MG tablet Take 1 tablet (100 mg total) by mouth 2 (two) times daily. 60 tablet 0 04/08/2020 at Unknown time  . pantoprazole (PROTONIX) 40 MG tablet Take 1 tablet (40 mg total) by mouth 2 (two) times daily.  180 tablet 0 04/08/2020 at Unknown time  . sertraline (ZOLOFT) 50 MG tablet Take 1 tablet (50 mg total) by mouth daily. 30 tablet 0 04/08/2020 at Unknown time  . triamcinolone ointment (KENALOG) 0.1 %    Past Week at PRN  . vitamin B-12 (CYANOCOBALAMIN) 1000 MCG tablet Take 1,000 mcg by mouth daily.   04/08/2020 at Unknown time  . ELIQUIS 5 MG TABS tablet Take 5 mg by mouth 2 (two) times daily.    04/07/2020  . isosorbide mononitrate (IMDUR) 30 MG 24 hr tablet Take 1 tablet (30 mg total) by mouth once daily. (Patient not taking: Reported on 03/10/2020)   03/09/2020   Home medication reconciliation was completed with the patient.   Scheduled Inpatient Medications:   . [MAR Hold] amLODipine  10 mg Oral Daily  . [MAR Hold] enoxaparin (LOVENOX) injection  40 mg Subcutaneous Q24H  . [MAR Hold] fluticasone furoate-vilanterol  1 puff Inhalation Daily  . [MAR Hold] folic acid  1 mg Oral Daily  . [MAR Hold] furosemide  20 mg Oral  BID  . [MAR Hold] metoprolol tartrate  100 mg Oral BID  . [MAR Hold] pantoprazole  40 mg Oral BID  . [MAR Hold] sertraline  50 mg Oral Daily  . [MAR Hold] umeclidinium bromide  1 puff Inhalation Daily    Continuous Inpatient Infusions:   . [MAR Hold] cefTRIAXone (ROCEPHIN)  IV 1 g (04/10/20 0422)  . [MAR Hold] vancomycin 1,000 mg (04/10/20 0538)    PRN Inpatient Medications:  [MAR Hold] acetaminophen **OR** [MAR Hold] acetaminophen, [MAR Hold] hydrALAZINE, [MAR Hold]  morphine injection, [MAR Hold] ondansetron **OR** [MAR Hold] ondansetron (ZOFRAN) IV, [MAR Hold] oxyCODONE-acetaminophen  Family History: family history includes Heart disease in his father; Stroke in his mother.   GI Family History:  Negative  Social History:   reports that he quit smoking about 4 years ago. His smoking use included cigarettes. He started smoking about 62 years ago. He has a 116.00 pack-year smoking history. He has never used smokeless tobacco. He reports current alcohol use of about 42.0 standard drinks of alcohol per week. He reports that he does not use drugs. The patient denies ETOH, tobacco, or drug use.    Review of Systems: Review of Systems - Negative except History of present illness  Physical Examination: BP (!) 165/76 (BP Location: Left Arm)   Pulse 68   Temp 98 F (36.7 C) (Oral)   Resp 16   Ht 5\' 10"  (1.778 m)   Wt 80.3 kg   SpO2 97%   BMI 25.40 kg/m  Physical Exam Vitals reviewed.  Constitutional:      Appearance: He is normal weight.  HENT:     Head: Normocephalic and atraumatic.     Nose: Nose normal.     Mouth/Throat:     Mouth: Mucous membranes are moist.  Eyes:     Extraocular Movements: Extraocular movements intact.     Pupils: Pupils are equal, round, and reactive to light.  Cardiovascular:     Rate and Rhythm: Normal rate.     Pulses: Normal pulses.  Pulmonary:     Effort: Pulmonary effort is normal.     Breath sounds: Normal breath sounds.  Abdominal:      General: Abdomen is flat.     Palpations: Abdomen is soft.  Musculoskeletal:        General: Normal range of motion.     Cervical back: Normal range of motion.  Skin:  General: Skin is warm and dry.  Neurological:     General: No focal deficit present.     Mental Status: He is alert.  Psychiatric:        Mood and Affect: Mood normal.     Data: Lab Results  Component Value Date   WBC 5.0 04/10/2020   HGB 8.6 (L) 04/10/2020   HCT 26.0 (L) 04/10/2020   MCV 100.8 (H) 04/10/2020   PLT 305 04/10/2020   Recent Labs  Lab 04/07/20 1345 04/08/20 1224 04/10/20 0528  HGB 9.6* 9.8* 8.6*   Lab Results  Component Value Date   NA 129 (L) 04/10/2020   K 4.6 04/10/2020   CL 95 (L) 04/10/2020   CO2 20 (L) 04/10/2020   BUN 11 04/10/2020   CREATININE 1.01 04/10/2020   GLU 93 01/30/2012   Lab Results  Component Value Date   ALT 11 04/08/2020   AST 17 04/08/2020   ALKPHOS 80 04/08/2020   BILITOT 0.9 04/08/2020   No results for input(s): APTT, INR, PTT in the last 168 hours. CBC Latest Ref Rng & Units 04/10/2020 04/08/2020 04/07/2020  WBC 4.0 - 10.5 K/uL 5.0 6.6 7.5  Hemoglobin 13.0 - 17.0 g/dL 8.6(L) 9.8(L) 9.6(L)  Hematocrit 39 - 52 % 26.0(L) 31.1(L) 29.4(L)  Platelets 150 - 400 K/uL 305 336 321    STUDIES: MR FOOT RIGHT WO CONTRAST  Result Date: 04/09/2020 CLINICAL DATA:  History of a skin ulceration on the right second toe. The patient is status post debridement of the wound 04/05/2020. Continued redness, pain and swelling. EXAM: MRI OF THE RIGHT FOREFOOT WITHOUT CONTRAST TECHNIQUE: Multiplanar, multisequence MR imaging of the right forefoot was performed. No intravenous contrast was administered. COMPARISON:  Plain films right foot 04/09/2020. FINDINGS: Bones/Joint/Cartilage There is no marrow edema to suggest osteomyelitis. The patient has remote healed fractures of the distal first and proximal second metatarsals. No acute fracture is identified. The PIP joint of the second  toe is laterally subluxed as seen on the prior plain films. Hallux valgus deformity is noted. 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 Ligaments The medial collateral ligament of the PIP joint of the second toe is difficult to visualize and may be torn. Otherwise negative. Muscles and Tendons No intramuscular fluid collection, tear or strain. Fatty atrophy of intrinsic musculature the foot noted. Soft tissues No focal fluid collection. Subcutaneous edema is seen in the dorsum of the foot. IMPRESSION: Negative for osteomyelitis, abscess or septic joint. Lateral subluxation PIP joint of the second toe. The medial collateral ligament of the PIP joint is not visible and may be torn. Remote healed first and second metatarsal fractures. Hallux valgus and first MTP osteoarthritis. Subcutaneous edema in the dorsum of the foot could be due to dependent change or cellulitis. Electronically Signed   By: Inge Rise M.D.   On: 04/09/2020 14:21   DG Foot Complete Right  Result Date: 04/09/2020 CLINICAL DATA:  Foot swelling, evaluate for osteomyelitis EXAM: RIGHT FOOT COMPLETE - 3+ VIEW COMPARISON:  Radiograph 10/09/2017 FINDINGS: Diffuse circumferential swelling of the foot. Questionable ulceration along the plantar and lateral aspect adjacent the head of the fifth metatarsal. No other radiographically evident sites of ulceration are evident. No soft tissue gas or foreign body is seen. No osseous erosion, destructive change or periostitis to suggest osteomyelitis at this location or elsewhere within the foot at this time. There are posttraumatic deformities of the first metatarsal and second metatarsal base. There is a volar dislocation of the second proximal  interphalangeal joint. 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 Hallux valgus deformity with chronic soft tissue thickening along the medial head  of the first metatarsal. Vascular calcium in the soft tissues. IMPRESSION: 1. Questionable ulceration along the plantar and lateral aspect adjacent the head of the fifth metatarsal. Correlate with visual inspection. 2. No other radiographically evident sites of ulceration within the foot at this time. 3. No radiographic evidence of osteomyelitis. If there is persisting clinical concern, MRI imaging is much more sensitive and specific for early features of osteomyelitis. 4. 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 5. Volar dislocation of the second proximal interphalangeal joint, new from prior. 6. Remote posttraumatic deformity of the first and second metatarsals. 7. Hallux valgus. Electronically Signed   By: Lovena Le M.D.   On: 04/09/2020 02:14   @IMAGES @  Assessment: 1 anemia secondary to gastrointestinal blood loss. 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 3.  Status post amputation of toe. 4. Chronic anticoagulation held since Friday.   COVID-19 status: Tested negative     Recommendations: 1. EGD. The patient understands the nature of the planned procedure, indications, risks, alternatives and potential complications including but not limited to bleeding, infection, perforation, damage to internal organs and possible oversedation/side effects from anesthesia. The patient agrees and gives consent to proceed.  Please refer to procedure notes for findings, recommendations and patient disposition/instructions.   Thank you for the consult. Please call with questions or concerns.  Olean Ree, "Lanny Hurst MD [facility] [place], [place] [address] [phone]  04/10/2020 11:56 AM

## 2020-04-10 NOTE — Progress Notes (Signed)
Day of Surgery   Subjective/Chief Complaint: Patient seen.  Relates little pain in his foot but overall not too bad.   Objective: Vital signs in last 24 hours: Temp:  [97.3 F (36.3 C)-98.4 F (36.9 C)] 98 F (36.7 C) (08/23 1222) Pulse Rate:  [65-86] 65 (08/23 1242) Resp:  [11-22] 22 (08/23 1242) BP: (109-198)/(66-106) 132/71 (08/23 1242) SpO2:  [91 %-100 %] 96 % (08/23 1242) Last BM Date: 04/09/20  Intake/Output from previous day: 08/22 0701 - 08/23 0700 In: 599.1 [I.V.:200; IV Piggyback:399.1] Out: 950 [Urine:950] Intake/Output this shift: Total I/O In: 300 [I.V.:300] Out: -   Bandage on the right foot is dry and intact.  No evidence of bleeding.  Lab Results:  Recent Labs    04/08/20 1224 04/10/20 0528  WBC 6.6 5.0  HGB 9.8* 8.6*  HCT 31.1* 26.0*  PLT 336 305   BMET Recent Labs    04/08/20 1224 04/10/20 0528  NA 126* 129*  K 4.8 4.6  CL 91* 95*  CO2 24 20*  GLUCOSE 79 124*  BUN 18 11  CREATININE 0.96 1.01  CALCIUM 9.6 8.8*   PT/INR No results for input(s): LABPROT, INR in the last 72 hours. ABG No results for input(s): PHART, HCO3 in the last 72 hours.  Invalid input(s): PCO2, PO2  Studies/Results: MR FOOT RIGHT WO CONTRAST  Result Date: 04/09/2020 CLINICAL DATA:  History of a skin ulceration on the right second toe. The patient is status post debridement of the wound 04/05/2020. Continued redness, pain and swelling. EXAM: MRI OF THE RIGHT FOREFOOT WITHOUT CONTRAST TECHNIQUE: Multiplanar, multisequence MR imaging of the right forefoot was performed. No intravenous contrast was administered. COMPARISON:  Plain films right foot 04/09/2020. FINDINGS: Bones/Joint/Cartilage There is no marrow edema to suggest osteomyelitis. The patient has remote healed fractures of the distal first and proximal second metatarsals. No acute fracture is identified. The PIP joint of the second toe is laterally subluxed as seen on the prior plain films. Hallux valgus  deformity is noted. 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 Ligaments The medial collateral ligament of the PIP joint of the second toe is difficult to visualize and may be torn. Otherwise negative. Muscles and Tendons No intramuscular fluid collection, tear or strain. Fatty atrophy of intrinsic musculature the foot noted. Soft tissues No focal fluid collection. Subcutaneous edema is seen in the dorsum of the foot. IMPRESSION: Negative for osteomyelitis, abscess or septic joint. Lateral subluxation PIP joint of the second toe. The medial collateral ligament of the PIP joint is not visible and may be torn. Remote healed first and second metatarsal fractures. Hallux valgus and first MTP osteoarthritis. Subcutaneous edema in the dorsum of the foot could be due to dependent change or cellulitis. Electronically Signed   By: Inge Rise M.D.   On: 04/09/2020 14:21   DG Foot Complete Right  Result Date: 04/09/2020 CLINICAL DATA:  Foot swelling, evaluate for osteomyelitis EXAM: RIGHT FOOT COMPLETE - 3+ VIEW COMPARISON:  Radiograph 10/09/2017 FINDINGS: Diffuse circumferential swelling of the foot. Questionable ulceration along the plantar and lateral aspect adjacent the head of the fifth metatarsal. No other radiographically evident sites of ulceration are evident. No soft tissue gas or foreign body is seen. No osseous erosion, destructive change or periostitis to suggest osteomyelitis at this location or elsewhere within the foot at this time. There are posttraumatic deformities of the first metatarsal and second metatarsal base. There is a volar dislocation of the second proximal interphalangeal joint. There is a Charcot deformity  of the foot with midfoot collapse similar to comparison from 2019. 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 Hallux valgus deformity with chronic soft tissue thickening along the medial head of the first metatarsal. Vascular calcium in the soft tissues.  IMPRESSION: 1. Questionable ulceration along the plantar and lateral aspect adjacent the head of the fifth metatarsal. Correlate with visual inspection. 2. No other radiographically evident sites of ulceration within the foot at this time. 3. No radiographic evidence of osteomyelitis. If there is persisting clinical concern, MRI imaging is much more sensitive and specific for early features of osteomyelitis. 4. 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 5. Volar dislocation of the second proximal interphalangeal joint, new from prior. 6. Remote posttraumatic deformity of the first and second metatarsals. 7. Hallux valgus. Electronically Signed   By: Lovena Le M.D.   On: 04/09/2020 02:14    Anti-infectives: Anti-infectives (From admission, onward)   Start     Dose/Rate Route Frequency Ordered Stop   04/09/20 1600  vancomycin (VANCOREADY) IVPB 1750 mg/350 mL  Status:  Discontinued        1,750 mg 175 mL/hr over 120 Minutes Intravenous Every 24 hours 04/09/20 0408 04/09/20 1419   04/09/20 1600  vancomycin (VANCOCIN) IVPB 1000 mg/200 mL premix        1,000 mg 200 mL/hr over 60 Minutes Intravenous Every 12 hours 04/09/20 1419     04/09/20 0400  cefTRIAXone (ROCEPHIN) 1 g in sodium chloride 0.9 % 100 mL IVPB        1 g 200 mL/hr over 30 Minutes Intravenous Every 24 hours 04/09/20 0319     04/09/20 0145  vancomycin (VANCOCIN) IVPB 1000 mg/200 mL premix        1,000 mg 200 mL/hr over 60 Minutes Intravenous  Once 04/09/20 0137 04/09/20 0307      Assessment/Plan: s/p Procedure(s): ESOPHAGOGASTRODUODENOSCOPY (EGD) (N/A) Assessment: 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   Plan: Dressing left intact.  Plan for dressing change tomorrow morning.  If everything looks good he should hopefully be stable for discharge tomorrow.  LOS: 1 day    Durward Fortes 04/10/2020

## 2020-04-10 NOTE — Op Note (Signed)
[facility] Gastroenterology Patient Name: Troy Lopez Procedure Date: 04/10/2020 11:48 AM MRN: [id number] Account #: [id number] Date of Birth: 08-20-1940 Admit Type: Inpatient Age: 79 Room: [facility] ENDO ROOM 3 Gender: Male Note Status: Finalized Procedure:             Upper GI endoscopy Indications:           Arteriovenous malformation in the stomach,                         Arteriovenous malformation in the small intestine Providers:             Benay Pike. Alice Reichert MD, MD Referring MD:          Einar Pheasant, MD (Referring MD) Medicines:             Propofol per Anesthesia Complications:         No immediate complications. Estimated blood loss: None. Procedure:             Pre-Anesthesia Assessment:                        - The risks and benefits of the procedure and the                         sedation options and risks were discussed with the                         patient. All questions were answered and informed                         consent was obtained.                        - Patient identification and proposed procedure were                         verified prior to the procedure by the nurse. The                         procedure was verified in the procedure room.                        - ASA Grade Assessment: III - A patient with severe                         systemic disease.                        - After reviewing the risks and benefits, the patient                         was deemed in satisfactory condition to undergo the                         procedure.                        After obtaining informed consent, the endoscope was  passed under direct vision. Throughout the procedure,                         the patient's blood pressure, pulse, and oxygen                         saturations were monitored continuously. The Endoscope                         was introduced through the mouth, and advanced to the                          third part of duodenum. The upper GI endoscopy was                         accomplished without difficulty. The patient tolerated                         the procedure well. Findings:      The examined esophagus was normal.      Five small angioectasias with no bleeding were found in the gastric       fundus, in the gastric body and in the gastric antrum. Coagulation for       bleeding prevention using argon plasma at 0.8 liters/minute and 20 watts       was successful. Estimated blood loss: none.      A single diminutive angioectasia without bleeding was found in the       duodenal bulb. Coagulation for bleeding prevention using argon plasma at       0.6 liters/minute and 20 watts was successful.      The second portion of the duodenum and third portion of the duodenum       were normal.      The exam was otherwise without abnormality. Impression:            - Normal esophagus.                        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                         Treated with argon plasma coagulation (APC).                        - A single non-bleeding angioectasia in the duodenum.                         Treated with argon plasma coagulation (APC).                        - Normal second portion of the duodenum and third                         portion of the duodenum.                        - The examination was otherwise normal.                        - No specimens collected. Recommendation:        -  Patient has a contact number available for                         emergencies. The signs and symptoms of potential                         delayed complications were discussed with the patient.                         Return to normal activities tomorrow. Written                         discharge instructions were provided to the patient.                        - Resume previous diet.                        - Continue present medications.                         - Repeat upper endoscopy PRN for retreatment.                        - Return to GI office in 3 months.                        - The findings and recommendations were discussed with                         the patient.                        - Resume anticoagulant medication at prior dose today.                         Refer to managing physician for further adjustment of                         therapy.                        - Return patient to hospital ward for possible                         discharge same day.                        - The findings and recommendations were discussed with                         the patient. Procedure Code(s):     --- Professional ---                        [phone], Esophagogastroduodenoscopy, flexible,                         transoral; with control of bleeding, any method Diagnosis Code(s):     --- Professional ---  K31.819, Angiodysplasia of stomach and duodenum                         without bleeding CPT copyright 2019 [employer]. All rights reserved. The codes documented in this report are preliminary and upon coder review may  be revised to meet current compliance requirements. Efrain Sella MD, MD 04/10/2020 12:23:48 PM This report has been signed electronically. Number of Addenda: 0 Note Initiated On: 04/10/2020 11:48 AM Estimated Blood Loss:  Estimated blood loss: none.      [facility]

## 2020-04-10 NOTE — Interval H&P Note (Signed)
History and Physical Interval Note:  04/10/2020 12:07 PM  [place]  has presented today for surgery, with the diagnosis of IDA,AVM STOMACH AND SMALL INTESTINE.  The various methods of treatment have been discussed with the patient and family. After consideration of risks, benefits and other options for treatment, the patient has consented to  Procedure(s): ESOPHAGOGASTRODUODENOSCOPY (EGD) (N/A) as a surgical intervention.  The patient's history has been reviewed, patient examined, no change in status, stable for surgery.  I have reviewed the patient's chart and labs.  Questions were answered to the patient's satisfaction.     [place], [place]

## 2020-04-10 NOTE — Anesthesia Preprocedure Evaluation (Signed)
Anesthesia Evaluation  Patient identified by MRN, date of birth, ID band Patient awake    Reviewed: Allergy & Precautions, H&P , NPO status , Patient's Chart, lab work & pertinent test results, reviewed documented beta blocker date and time   Airway Mallampati: III   Neck ROM: full    Dental  (+) Poor Dentition   Pulmonary shortness of breath and with exertion, sleep apnea , COPD,  COPD inhaler, former smoker,    Pulmonary exam normal        Cardiovascular Exercise Tolerance: Poor hypertension, On Medications + CAD  Normal cardiovascular exam Rhythm:regular Rate:Normal     Neuro/Psych PSYCHIATRIC DISORDERS Anxiety Depression  Neuromuscular disease    GI/Hepatic Neg liver ROS, GERD  Medicated,  Endo/Other  negative endocrine ROS  Renal/GU negative Renal ROS  negative genitourinary   Musculoskeletal   Abdominal   Peds  Hematology  (+) Blood dyscrasia, anemia ,   Anesthesia Other Findings Past Medical History: 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 No date: Anemia     Comment:  IDA No date: Anxiety No date: CAD (coronary artery disease) No date: Carpal tunnel syndrome No date: Chronic back pain No date: Chronic hyponatremia No date: Colitis No date: Colonic polyp No date: COPD (chronic obstructive pulmonary disease) (HCC) No date: Degenerative disc disease, lumbar No date: Depression No date: Diverticulosis No date: Dyspnea No date: Gastric AVM No date: GERD (gastroesophageal reflux disease) No date: H/O degenerative disc disease No date: Hypercholesterolemia No date: Hyperkalemia No date: Hyperlipidemia No date: Hypertension No date: Irritable bowel syndrome 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 No date: Neuropathy 08/17/2015: Personal history of tobacco use, presenting hazards to  health No date: Sleep apnea No date: Vertebral compression fracture  [facility]) Past Surgical History: 1989: BUNIONECTOMY No date: CARDIAC CATHETERIZATION 2011: CARPAL TUNNEL RELEASE; Left     Comment:  ulnar nerve sub muscular at elbow 09/07: CHOLECYSTECTOMY 03/05/2017: COLONOSCOPY WITH PROPOFOL; N/A     Comment:  Procedure: COLONOSCOPY WITH PROPOFOL;  Surgeon: Lucilla Lame, MD;  Location: [facility] ENDOSCOPY;  Service:               Endoscopy;  Laterality: N/A; 11/15/2015: ELECTROPHYSIOLOGIC STUDY; N/A     Comment:  Procedure: CARDIOVERSION;  Surgeon: Isaias Cowman,              MD;  Location: [facility] ORS;  Service: Cardiovascular;                Laterality: N/A; 03/05/2017: ESOPHAGOGASTRODUODENOSCOPY (EGD) WITH PROPOFOL; N/A     Comment:  Procedure: ESOPHAGOGASTRODUODENOSCOPY (EGD) WITH               PROPOFOL;  Surgeon: Lucilla Lame, MD;  Location: [facility]               ENDOSCOPY;  Service: Endoscopy;  Laterality: N/A; 10/17/2017: ESOPHAGOGASTRODUODENOSCOPY (EGD) WITH PROPOFOL; N/A     Comment:  Procedure: ESOPHAGOGASTRODUODENOSCOPY (EGD) WITH               PROPOFOL;  Surgeon: Lollie Sails, MD;  Location:               [facility] ENDOSCOPY;  Service: Endoscopy;  Laterality: N/A; 2010: EYE SURGERY; Bilateral     Comment:  cataract No date: HEMORRHOID SURGERY 11/04/2016: KYPHOPLASTY; N/A     Comment:  Procedure: KYPHOPLASTY T 12;  Surgeon: Hessie Knows,  MD;              Location: [facility] ORS;  Service: Orthopedics;  Laterality:               N/A; BMI    Body Mass Index: 25.40 kg/m     Reproductive/Obstetrics negative OB ROS                             Anesthesia Physical Anesthesia Plan  ASA: III and emergent  Anesthesia Plan: General   Post-op Pain Management:    Induction:   PONV Risk Score and Plan:   Airway Management Planned:   Additional Equipment:   Intra-op Plan:   Post-operative Plan:   Informed Consent: I have reviewed the patients History and Physical, chart, labs and discussed the procedure  including the risks, benefits and alternatives for the proposed anesthesia with the patient or authorized representative who has indicated his/her understanding and acceptance.     Dental Advisory Given  Plan Discussed with: CRNA  Anesthesia Plan Comments:         Anesthesia Quick Evaluation

## 2020-04-10 NOTE — Anesthesia Postprocedure Evaluation (Signed)
Anesthesia Post Note  Patient: Troy Lopez  Procedure(s) Performed: ESOPHAGOGASTRODUODENOSCOPY (EGD) (N/A )  Patient location during evaluation: PACU Anesthesia Type: General Level of consciousness: awake and alert Pain management: pain level controlled Vital Signs Assessment: post-procedure vital signs reviewed and stable Respiratory status: spontaneous breathing, nonlabored ventilation, respiratory function stable and patient connected to nasal cannula oxygen Cardiovascular status: blood pressure returned to baseline and stable Postop Assessment: no apparent nausea or vomiting Anesthetic complications: no   No complications documented.   Last Vitals:  Vitals:   04/10/20 1232 04/10/20 1242  BP: 120/70 132/71  Pulse: 70 65  Resp: 17 (!) 22  Temp:    SpO2: 98% 96%    Last Pain:  Vitals:   04/10/20 1242  TempSrc:   PainSc: 0-No pain                 Molli Barrows

## 2020-04-11 ENCOUNTER — Encounter: Payer: Self-pay | Admitting: Podiatry

## 2020-04-11 LAB — BASIC METABOLIC PANEL
Anion gap: 12 (ref 5–15)
BUN: 13 mg/dL (ref 8–23)
CO2: 19 mmol/L — ABNORMAL LOW (ref 22–32)
Calcium: 8.9 mg/dL (ref 8.9–10.3)
Chloride: 96 mmol/L — ABNORMAL LOW (ref 98–111)
Creatinine, Ser: 0.97 mg/dL (ref 0.61–1.24)
GFR calc Af Amer: >60 mL/min (ref >60)
GFR calc non Af Amer: >60 mL/min (ref >60)
Glucose, Bld: 103 mg/dL — ABNORMAL HIGH (ref 70–99)
Potassium: 3.7 mmol/L (ref 3.5–5.1)
Sodium: 127 mmol/L — ABNORMAL LOW (ref 135–145)

## 2020-04-11 LAB — CBC
HCT: 26.7 % — ABNORMAL LOW (ref 39.0–52.0)
Hemoglobin: 8.6 g/dL — ABNORMAL LOW (ref 13.0–17.0)
MCH: 33.3 pg (ref 26.0–34.0)
MCHC: 32.2 g/dL (ref 30.0–36.0)
MCV: 103.5 fL — ABNORMAL HIGH (ref 80.0–100.0)
Platelets: 341 10*3/uL (ref 150–400)
RBC: 2.58 MIL/uL — ABNORMAL LOW (ref 4.22–5.81)
RDW: 21.2 % — ABNORMAL HIGH (ref 11.5–15.5)
WBC: 7.3 10*3/uL (ref 4.0–10.5)
nRBC: 0 % (ref 0.0–0.2)

## 2020-04-11 LAB — MAGNESIUM: Magnesium: 1.8 mg/dL (ref 1.7–2.4)

## 2020-04-11 MED ORDER — FOLIC ACID 1 MG PO TABS: 1.0000 mg | ORAL_TABLET | Freq: Every day | ORAL | Status: AC

## 2020-04-11 MED ORDER — AMLODIPINE BESYLATE 10 MG PO TABS: 10.0000 mg | ORAL_TABLET | Freq: Every day | ORAL | 2 refills | Status: DC

## 2020-04-11 NOTE — Progress Notes (Signed)
Pt was discharged at 0925. PIV was removed. AVS was discussed with patient and wife. Wife conveyed understanding. Pt was escorted off the unit via wheelchair by NT.

## 2020-04-11 NOTE — Discharge Summary (Signed)
Physician Discharge Summary   [place]  male DOB: 18-Dec-1940  JIR:[id number]  PCP: Einar Pheasant, MD  Admit date: 04/09/2020 Discharge date: 04/11/2020  Admitted From: home Disposition:  home CODE STATUS: Full code  Discharge Instructions    Discharge instructions   Complete by: As directed    follow-up with podiatry outpatient in 1 week.    Continue to take doxycycline you already have at home until podiatry followup.  A new blood pressure medication has been prescribed since your blood pressure has been elevated, amlodipine 10 mg daily.  Also your folate level is low, please take over-the-counter supplement as directed.  You have received EGD with ablation of AVM's in your stomach and small intestine.  Please follow up with outpatient GI in 3 months.    Dr. [name] - -   Discharge wound care:   Complete by: As directed    keep the bandage clean, dry, and do not remove.       Hospital Course:  For full details, please see H&P, progress notes, consult notes and ancillary notes.  Briefly,  Troy Lopez a 79 y.o.Caucasian malewith medical history significant forCAD, paroxysmal a flutter, COPD, who presented to the emergency room with complaints of increased redness and swelling of the right 2nd toe now advanced into the ankle and lower leg. He denied fever or chills.  Cellulitis of right footfollowingdebridement right second toe ulcer Dislocation right second toe PIPJ  Patient was seen by podiatry on 04/05/2020 when he underwent debridement and was sent home on doxycycline.  Patient now with cellulitis second toe now extending to right foot.  No evidence of osteomyelitis on x-ray.  Pt was started on IV Rocephinand vancomycin on presentation.  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  Podiatry changed dressing on 8/24 and cleared pt for discharge on doxycycline.  Pt will follow up with podiatry in outpatient clinic in 1 week after  discharge.  CAD (coronary artery disease) continued home metop  Hypertension BP elevated on home metop and lasix.  added amlodipine 10 mg daily.  Moderate COPD (chronic obstructive pulmonary disease) (HCC) continued home Breo and INcruse  Atrial flutter ([place]) -Patient was off eliquis for upcoming EGD.  continued home metop.  Eliquis resumed after discharge.  Hx of iron def anemia 2/2 chronic blood loss Hx of AVM of stomach and small intestine EGD performed on 8/23 as per original outpatient schedule. Found Five non-bleeding angioectasias in the stomach, treated with argon plasma coagulation (APC).  Found A single non-bleeding angioectasia in the duodenum, treated with argon plasma coagulation (APC).  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   albuterol 108 (90 Base) MCG/ACT inhaler Commonly known as: VENTOLIN HFA Inhale 2 puffs into the lungs every 6 (six) hours as needed for wheezing or shortness of breath.   amLODipine 10 MG tablet Commonly known as: NORVASC Take 1 tablet (10 mg total) by mouth daily. Blood pressure medication.    azelastine 0.1 % nasal spray Commonly known as: ASTELIN Place 1 spray into both nostrils 2 (two) times daily. Use in each nostril as directed   budesonide 3 MG 24 hr capsule Commonly known as: ENTOCORT EC Take 9 mg by mouth daily as needed (colitis flare).   calcium-vitamin D 500-200 MG-UNIT tablet Commonly known as: OSCAL WITH D Take 2 tablets by mouth daily.   doxycycline 100 MG capsule Commonly known as: VIBRAMYCIN Take 100 mg by mouth 2 (two) times daily.   Eliquis 5 MG Tabs tablet Generic drug: apixaban Take 5 mg by mouth 2 (two) times daily.   FIBER PO Take 1 capsule by mouth daily.   fluticasone 50 MCG/ACT nasal spray Commonly known as: FLONASE Place 1 spray into both nostrils daily.   folic acid 1 MG tablet Commonly known as: FOLVITE Take 1 tablet (1 mg total) by mouth daily. Can take any over-the-counter supplement.   furosemide 20 MG tablet Commonly known as: LASIX Take 20 mg by mouth 2 (two) times daily.   gabapentin 100 MG capsule Commonly known as: NEURONTIN :2 Capsule(s) By Mouth Every 3 Hours PRN   metoprolol tartrate 100 MG tablet Commonly known as: LOPRESSOR Take 1 tablet (100 mg total) by mouth 2 (two) times daily.   pantoprazole 40 MG tablet Commonly known as: PROTONIX Take 1 tablet (40 mg total) by mouth 2 (two) times daily.   sertraline 50 MG tablet Commonly known as: ZOLOFT Take 1 tablet (50 mg total) by mouth daily.   Trelegy Ellipta 100-62.5-25 MCG/INH Aepb Generic drug: Fluticasone-Umeclidin-Vilant Inhale 1 puff into the lungs daily.   triamcinolone ointment 0.1 % Commonly known as: KENALOG   vitamin B-12 1000 MCG tablet Commonly known as: CYANOCOBALAMIN Take 1,000 mcg by mouth daily.            Discharge Care Instructions  (From admission, onward)         Start     Ordered   04/11/20 0000  Discharge wound care:       Comments: keep the bandage clean, dry, and do not remove.   04/11/20 0827           Follow-up  Information    Einar Pheasant, MD. Schedule an appointment as soon as possible for a visit in 1 week(s).   Specialty: Internal Medicine Contact information: [address] [place] [address] [phone]        Sharlotte Alamo, DPM. Schedule an appointment as soon as possible for a visit in 1 week(s).   Specialty: Podiatry Contact information: [place] [address] [phone]        Efrain Sella, MD. Schedule an appointment as soon as possible for a visit in 3 month(s).   Specialty: Gastroenterology Contact information: [place] [address] [phone]               Allergies  Allergen Reactions  . 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   Consultations:   Procedures/Studies: MR FOOT RIGHT WO CONTRAST  Result Date: 04/09/2020 CLINICAL DATA:  History of a skin ulceration on the right second toe. The patient is status post debridement of the wound 04/05/2020. Continued redness, pain and swelling. EXAM: MRI OF THE RIGHT FOREFOOT WITHOUT CONTRAST TECHNIQUE: Multiplanar, multisequence MR imaging of the right forefoot was performed. No intravenous contrast was administered. COMPARISON:  Plain films right foot 04/09/2020. FINDINGS: Bones/Joint/Cartilage There is no marrow edema to suggest osteomyelitis. The patient has remote healed fractures of the distal first and proximal second metatarsals. No acute fracture is identified. The PIP joint of the second toe is laterally subluxed as seen on the prior plain films. Hallux valgus deformity is noted. 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  Ligaments The medial collateral ligament of the PIP joint of the second toe is difficult to visualize and may be torn. Otherwise negative. Muscles and Tendons No intramuscular fluid collection, tear or strain. Fatty atrophy of intrinsic musculature the foot noted. Soft tissues No focal fluid collection. Subcutaneous edema is seen in the dorsum of the foot. IMPRESSION: Negative for osteomyelitis, abscess or septic joint. Lateral subluxation PIP joint of the second toe. The medial collateral ligament of the PIP joint is not visible and may be torn. Remote healed first and second metatarsal fractures. Hallux valgus and first MTP osteoarthritis. Subcutaneous edema in the dorsum of the foot could be due to dependent change or cellulitis. Electronically Signed   By: Inge Rise M.D.   On: 04/09/2020 14:21   DG Foot Complete Right  Result Date: 04/09/2020 CLINICAL DATA:  Foot swelling, evaluate for osteomyelitis EXAM: RIGHT FOOT COMPLETE - 3+ VIEW COMPARISON:  Radiograph 10/09/2017 FINDINGS: Diffuse circumferential swelling of the foot. Questionable ulceration along the plantar and lateral aspect adjacent the head of the fifth metatarsal. No other radiographically evident sites of ulceration are evident. No soft tissue gas or foreign body is seen. No osseous erosion, destructive change or periostitis to suggest osteomyelitis at this location or elsewhere within the foot at this time. There are posttraumatic deformities of the first metatarsal and second metatarsal base. There is a volar dislocation of the second proximal interphalangeal joint. 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 Hallux valgus deformity with chronic soft tissue thickening along the medial head of the first metatarsal. Vascular calcium in the soft tissues. IMPRESSION: 1. Questionable ulceration along the plantar and  lateral aspect adjacent the head of the fifth metatarsal. Correlate with visual inspection. 2. No other radiographically evident sites of ulceration within the foot at this time. 3. No radiographic evidence of osteomyelitis. If there is persisting clinical concern, MRI imaging is much more sensitive and specific for early features of osteomyelitis. 4. 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 5. Volar dislocation of the second proximal interphalangeal joint, new from prior. 6. Remote posttraumatic deformity of the first and second metatarsals. 7. Hallux valgus. Electronically Signed   By: Lovena Le M.D.   On: 04/09/2020 02:14      Labs: BNP (last 3 results) Recent Labs    12/06/19 1253 01/06/20 1328  BNP 519.0* 403.4*   Basic Metabolic Panel: Recent Labs  Lab 04/08/20 1224 04/10/20 0528 04/11/20 0556  NA 126* 129*  127*  K 4.8 4.6 3.7  CL 91* 95* 96*  CO2 24 20* 19*  GLUCOSE 79 124* 103*  BUN 18 11 13   CREATININE 0.96 1.01 0.97  CALCIUM 9.6 8.8* 8.9  MG  --  1.8 1.8   Liver Function Tests: Recent Labs  Lab 04/08/20 1224  AST 17  ALT 11  ALKPHOS 80  BILITOT 0.9  PROT 8.3*  ALBUMIN 3.5   No results for input(s): LIPASE, AMYLASE in the last 168 hours. No results for input(s): AMMONIA in the last 168 hours. CBC: Recent Labs  Lab 04/07/20 1345 04/08/20 1224 04/10/20 0528 04/11/20 0556  WBC 7.5 6.6 5.0 7.3  NEUTROABS 5.3 4.8  --   --   HGB 9.6* 9.8* 8.6* 8.6*  HCT 29.4* 31.1* 26.0* 26.7*  MCV 101.7* 105.4* 100.8* 103.5*  PLT 321 336 305 341   Cardiac Enzymes: No results for input(s): CKTOTAL, CKMB, CKMBINDEX, TROPONINI in the last 168 hours. BNP: Invalid input(s): POCBNP CBG: No results for input(s): GLUCAP in the last 168 hours. D-Dimer No results for input(s): DDIMER in the last 72 hours. Hgb A1c No results for input(s): HGBA1C in the last 72 hours. Lipid Profile No results for input(s): CHOL, HDL, LDLCALC, TRIG, CHOLHDL, LDLDIRECT in the  last 72 hours. Thyroid function studies No results for input(s): TSH, T4TOTAL, T3FREE, THYROIDAB in the last 72 hours.  Invalid input(s): FREET3 Anemia work up Recent Labs    04/08/20 1224  VITAMINB12 1,046*  FOLATE 4.9*   Urinalysis    Component Value Date/Time   COLORURINE YELLOW (A) 02/18/2018 1136   APPEARANCEUR CLEAR (A) 02/18/2018 1136   LABSPEC 1.008 02/18/2018 1136   PHURINE 5.0 02/18/2018 1136   GLUCOSEU NEGATIVE 02/18/2018 1136   HGBUR NEGATIVE 02/18/2018 1136   BILIRUBINUR NEGATIVE 02/18/2018 1136   KETONESUR NEGATIVE 02/18/2018 1136   PROTEINUR NEGATIVE 02/18/2018 1136   NITRITE NEGATIVE 02/18/2018 1136   LEUKOCYTESUR NEGATIVE 02/18/2018 1136   Sepsis Labs Invalid input(s): PROCALCITONIN,  WBC,  LACTICIDVEN Microbiology Recent Results (from the past 240 hour(s))  SARS CORONAVIRUS 2 (TAT 6-24 HRS) Nasopharyngeal Nasopharyngeal Swab     Status: None   Collection Time: 04/07/20 10:50 AM   Specimen: Nasopharyngeal Swab  Result Value Ref Range Status   SARS Coronavirus 2 NEGATIVE NEGATIVE Final    Comment: (NOTE) SARS-CoV-2 target nucleic acids are NOT DETECTED.  The SARS-CoV-2 RNA is generally detectable in upper and lower respiratory specimens during the acute phase of infection. Negative results do not preclude SARS-CoV-2 infection, do not rule out co-infections with other pathogens, and should not be used as the sole basis for treatment or other patient management decisions. Negative results must be combined with clinical observations, patient history, and epidemiological information. The expected result is Negative.  Fact Sheet for Patients: SugarRoll.be  Fact Sheet for Healthcare Providers: https://www.woods-mathews.com/  This test is not yet approved or cleared by the [place] FDA and  has been authorized for detection and/or diagnosis of SARS-CoV-2 by FDA under an Emergency Use Authorization (EUA).  This EUA will remain  in effect (meaning this test can be used) for the duration of the COVID-19 declaration under Se ction 564(b)(1) of the Act, 21 U.S.C. section 360bbb-3(b)(1), unless the authorization is terminated or revoked sooner.  Performed at [facility], [place] [address] [place], [place] [address]   Culture, blood (routine x 2)     Status: None (Preliminary result)   Collection Time: 04/08/20 12:20 PM   Specimen:  BLOOD  Result Value Ref Range Status   Specimen Description BLOOD BLOOD LEFT FOREARM  Final   Special Requests   Final    BOTTLES DRAWN AEROBIC AND ANAEROBIC Blood Culture adequate volume   Culture   Final    NO GROWTH 2 DAYS Performed at [facility], [name], [place], [place] [address]    Report Status PENDING  Incomplete  SARS Coronavirus 2 by RT PCR (hospital order, performed in [facility] hospital lab) Nasopharyngeal Nasopharyngeal Swab     Status: None   Collection Time: 04/09/20  1:38 AM   Specimen: Nasopharyngeal Swab  Result Value Ref Range Status   SARS Coronavirus 2 NEGATIVE NEGATIVE Final    Comment: (NOTE) SARS-CoV-2 target nucleic acids are NOT DETECTED.  The SARS-CoV-2 RNA is generally detectable in upper and lower respiratory specimens during the acute phase of infection. The lowest concentration of SARS-CoV-2 viral copies this assay can detect is 250 copies / mL. A negative result does not preclude SARS-CoV-2 infection and should not be used as the sole basis for treatment or other patient management decisions.  A negative result may occur with improper specimen collection / handling, submission of specimen other than nasopharyngeal swab, presence of viral mutation(s) within the areas targeted by this assay, and inadequate number of viral copies (<250 copies / mL). A negative result must be combined with clinical observations, patient history, and epidemiological information.  Fact Sheet for Patients:     StrictlyIdeas.no  Fact Sheet for Healthcare Providers: BankingDealers.co.za  This test is not yet approved or  cleared by the [place] FDA and has been authorized for detection and/or diagnosis of SARS-CoV-2 by FDA under an Emergency Use Authorization (EUA).  This EUA will remain in effect (meaning this test can be used) for the duration of the COVID-19 declaration under Section 564(b)(1) of the Act, 21 U.S.C. section 360bbb-3(b)(1), unless the authorization is terminated or revoked sooner.  Performed at [facility], [address] [place], [place] [address]   Culture, blood (routine x 2)     Status: None (Preliminary result)   Collection Time: 04/09/20  1:47 AM   Specimen: BLOOD  Result Value Ref Range Status   Specimen Description BLOOD BLOOD RIGHT FOREARM  Final   Special Requests   Final    BOTTLES DRAWN AEROBIC AND ANAEROBIC Blood Culture results may not be optimal due to an excessive volume of blood received in culture bottles   Culture   Final    NO GROWTH 2 DAYS Performed at [facility], [address], [place], [place] [address]    Report Status PENDING  Incomplete  MRSA PCR Screening     Status: None   Collection Time: 04/09/20  6:02 PM   Specimen: Nasopharyngeal  Result Value Ref Range Status   MRSA by PCR NEGATIVE NEGATIVE Final    Comment:        The GeneXpert MRSA Assay (FDA approved for NASAL specimens only), is one component of a comprehensive MRSA colonization surveillance program. It is not intended to diagnose MRSA infection nor to guide or monitor treatment for MRSA infections. Performed at [facility], [address] [place], [place] [address]   Anaerobic culture     Status: None (Preliminary result)   Collection Time: 04/09/20  8:25 PM   Specimen: PATH Other  Result Value Ref Range Status   Specimen Description BONE  Final   Special Requests   Final    RIGHT  SECOND TOE Performed at [facility], [place] [address] [place], [place] [address]    Gram Stain PENDING  Incomplete   Culture PENDING  Incomplete   Report Status PENDING  Incomplete  Aerobic Culture (superficial specimen)     Status: None (Preliminary result)   Collection Time: 04/09/20  8:25 PM   Specimen: PATH Other  Result Value Ref Range Status   Specimen Description BONE  Final   Special Requests RIGHT SECOND TOE  Final   Gram Stain NO WBC SEEN NO ORGANISMS SEEN   Final   Culture   Final    NO GROWTH < 12 HOURS Performed at [facility], [place] [address], [place], [place] [address]    Report Status PENDING  Incomplete     Total time spend on discharging this patient, including the last patient exam, discussing the hospital stay, instructions for ongoing care as it relates to all pertinent caregivers, as well as preparing the medical discharge records, prescriptions, and/or referrals as applicable, is 35 minutes.    [name], MD  [facility] 04/11/2020, 8:30 AM  If 7PM-7AM, please contact night-coverage

## 2020-04-11 NOTE — Anesthesia Postprocedure Evaluation (Signed)
Anesthesia Post Note  Patient: Troy Lopez  Procedure(s) Performed: AMPUTATION TOE (Right Toe)  Patient location during evaluation: PACU Anesthesia Type: General Level of consciousness: awake and alert and oriented Pain management: pain level controlled Vital Signs Assessment: post-procedure vital signs reviewed and stable Respiratory status: spontaneous breathing Cardiovascular status: blood pressure returned to baseline Anesthetic complications: no   No complications documented.   Last Vitals:  Vitals:   04/11/20 0351 04/11/20 0807  BP: (!) 159/97 (!) 172/78  Pulse: 71 64  Resp: 16 17  Temp: 36.7 C 36.6 C  SpO2: 96% 98%    Last Pain:  Vitals:   04/11/20 0807  TempSrc: Oral  PainSc:                  [name]

## 2020-04-11 NOTE — Progress Notes (Signed)
1 Day Post-Op   Subjective/Chief Complaint: Patient seen.  Foot is feeling better.  Doing well and states he is ready to go home.   Objective: Vital signs in last 24 hours: Temp:  [97.3 F (36.3 C)-98.2 F (36.8 C)] 97.8 F (36.6 C) (08/24 0807) Pulse Rate:  [64-78] 64 (08/24 0807) Resp:  [11-22] 17 (08/24 0807) BP: (109-174)/(66-97) 172/78 (08/24 0807) SpO2:  [91 %-99 %] 98 % (08/24 0807) Last BM Date: 04/09/20  Intake/Output from previous day: 08/23 0701 - 08/24 0700 In: 300 [I.V.:300] Out: -  Intake/Output this shift: No intake/output data recorded.  Only mild dried blood on the bandaging.  Upon removal the incision is well coapted with no drainage.  Erythema and edema in the right foot significantly improved.  Lab Results:  Recent Labs    04/10/20 0528 04/11/20 0556  WBC 5.0 7.3  HGB 8.6* 8.6*  HCT 26.0* 26.7*  PLT 305 341   BMET Recent Labs    04/10/20 0528 04/11/20 0556  NA 129* 127*  K 4.6 3.7  CL 95* 96*  CO2 20* 19*  GLUCOSE 124* 103*  BUN 11 13  CREATININE 1.01 0.97  CALCIUM 8.8* 8.9   PT/INR No results for input(s): LABPROT, INR in the last 72 hours. ABG No results for input(s): PHART, HCO3 in the last 72 hours.  Invalid input(s): PCO2, PO2  Studies/Results: MR FOOT RIGHT WO CONTRAST  Result Date: 04/09/2020 CLINICAL DATA:  History of a skin ulceration on the right second toe. The patient is status post debridement of the wound 04/05/2020. Continued redness, pain and swelling. EXAM: MRI OF THE RIGHT FOREFOOT WITHOUT CONTRAST TECHNIQUE: Multiplanar, multisequence MR imaging of the right forefoot was performed. No intravenous contrast was administered. COMPARISON:  Plain films right foot 04/09/2020. FINDINGS: Bones/Joint/Cartilage There is no marrow edema to suggest osteomyelitis. The patient has remote healed fractures of the distal first and proximal second metatarsals. No acute fracture is identified. The PIP joint of the second toe is  laterally subluxed as seen on the prior plain films. Hallux valgus deformity is noted. 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 Ligaments The medial collateral ligament of the PIP joint of the second toe is difficult to visualize and may be torn. Otherwise negative. Muscles and Tendons No intramuscular fluid collection, tear or strain. Fatty atrophy of intrinsic musculature the foot noted. Soft tissues No focal fluid collection. Subcutaneous edema is seen in the dorsum of the foot. IMPRESSION: Negative for osteomyelitis, abscess or septic joint. Lateral subluxation PIP joint of the second toe. The medial collateral ligament of the PIP joint is not visible and may be torn. Remote healed first and second metatarsal fractures. Hallux valgus and first MTP osteoarthritis. Subcutaneous edema in the dorsum of the foot could be due to dependent change or cellulitis. Electronically Signed   By: Inge Rise M.D.   On: 04/09/2020 14:21    Anti-infectives: Anti-infectives (From admission, onward)   Start     Dose/Rate Route Frequency Ordered Stop   04/09/20 1600  vancomycin (VANCOREADY) IVPB 1750 mg/350 mL  Status:  Discontinued        1,750 mg 175 mL/hr over 120 Minutes Intravenous Every 24 hours 04/09/20 0408 04/09/20 1419   04/09/20 1600  vancomycin (VANCOCIN) IVPB 1000 mg/200 mL premix        1,000 mg 200 mL/hr over 60 Minutes Intravenous Every 12 hours 04/09/20 1419     04/09/20 0400  cefTRIAXone (ROCEPHIN) 1 g in sodium chloride 0.9 % 100  mL IVPB        1 g 200 mL/hr over 30 Minutes Intravenous Every 24 hours 04/09/20 0319     04/09/20 0145  vancomycin (VANCOCIN) IVPB 1000 mg/200 mL premix        1,000 mg 200 mL/hr over 60 Minutes Intravenous  Once 04/09/20 0137 04/09/20 0307      Assessment/Plan: s/p Procedure(s): ESOPHAGOGASTRODUODENOSCOPY (EGD) (N/A) Assessment: 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   Plan: Betadine and a sterile dressing reapplied to the right foot.  Patient was  instructed to keep the bandage clean, dry, and do not remove.  Patient should be stable for discharge this morning and we will plan for follow-up outpatient in 1 week.  Patient has doxycycline at home that he can continue on.  Currently culture shows no growth.  LOS: 2 days    Durward Fortes 04/11/2020

## 2020-04-12 ENCOUNTER — Encounter: Payer: Self-pay | Admitting: Internal Medicine

## 2020-04-12 ENCOUNTER — Telehealth: Payer: Self-pay

## 2020-04-12 LAB — AEROBIC CULTURE W GRAM STAIN (SUPERFICIAL SPECIMEN)
Culture: NO GROWTH
Gram Stain: NONE SEEN

## 2020-04-12 LAB — SURGICAL PATHOLOGY

## 2020-04-12 NOTE — Telephone Encounter (Signed)
Transition Care Management Follow-up Telephone Call  Date of discharge and from where: 04/11/20-[facility]  How have you been since you were released from the hospital? Doing much better  Any questions or concerns? No  Items Reviewed:  Did the pt receive and understand the discharge instructions provided? Yes   Medications obtained and verified? Yes   Any new allergies since your discharge? No   Dietary orders reviewed? Yes  Do you have support at home? Yes   Functional Questionnaire: (I = Independent and D = Dependent) ADLs: I  Bathing/Dressing- I  Meal Prep- I  Eating- I  Maintaining continence- I  Transferring/Ambulation- I  Managing Meds- I  Follow up appointments reviewed:   PCP Hospital f/u appt confirmed? No  No open hospital f/u appts available-message sent to office staff to call & schedule appt.  [facility] f/u appt confirmed? Yes  Scheduled to see Podiatry on 04/19/2020   Are transportation arrangements needed? No   If their condition worsens, is the pt aware to call PCP or go to the Emergency Dept.? Yes  Was the patient provided with contact information for the PCP's office or ED? Yes  Was to pt encouraged to call back with questions or concerns? Yes

## 2020-04-13 ENCOUNTER — Telehealth: Payer: Self-pay

## 2020-04-13 NOTE — Telephone Encounter (Signed)
Can schedule 04/18/20 at 12:00 or 04/20/20 at 12:00

## 2020-04-13 NOTE — Telephone Encounter (Signed)
Called twice unable to connect with pt  Unable to lm

## 2020-04-14 ENCOUNTER — Inpatient Hospital Stay: Payer: PPO

## 2020-04-14 ENCOUNTER — Other Ambulatory Visit: Payer: Self-pay

## 2020-04-14 VITALS — BP 114/70 | HR 71 | Temp 97.6°F | Resp 18

## 2020-04-14 DIAGNOSIS — C880 Waldenstrom macroglobulinemia: Secondary | ICD-10-CM | POA: Diagnosis not present

## 2020-04-14 DIAGNOSIS — D509 Iron deficiency anemia, unspecified: Secondary | ICD-10-CM

## 2020-04-14 DIAGNOSIS — F418 Other specified anxiety disorders: Secondary | ICD-10-CM | POA: Diagnosis not present

## 2020-04-14 DIAGNOSIS — G4730 Sleep apnea, unspecified: Secondary | ICD-10-CM | POA: Diagnosis not present

## 2020-04-14 DIAGNOSIS — G8929 Other chronic pain: Secondary | ICD-10-CM | POA: Diagnosis not present

## 2020-04-14 DIAGNOSIS — K589 Irritable bowel syndrome without diarrhea: Secondary | ICD-10-CM | POA: Diagnosis not present

## 2020-04-14 DIAGNOSIS — Z79899 Other long term (current) drug therapy: Secondary | ICD-10-CM | POA: Diagnosis not present

## 2020-04-14 DIAGNOSIS — E7800 Pure hypercholesterolemia, unspecified: Secondary | ICD-10-CM | POA: Diagnosis not present

## 2020-04-14 DIAGNOSIS — M549 Dorsalgia, unspecified: Secondary | ICD-10-CM | POA: Diagnosis not present

## 2020-04-14 DIAGNOSIS — I2510 Atherosclerotic heart disease of native coronary artery without angina pectoris: Secondary | ICD-10-CM | POA: Diagnosis not present

## 2020-04-14 DIAGNOSIS — I10 Essential (primary) hypertension: Secondary | ICD-10-CM | POA: Diagnosis not present

## 2020-04-14 DIAGNOSIS — K219 Gastro-esophageal reflux disease without esophagitis: Secondary | ICD-10-CM | POA: Diagnosis not present

## 2020-04-14 DIAGNOSIS — J449 Chronic obstructive pulmonary disease, unspecified: Secondary | ICD-10-CM | POA: Diagnosis not present

## 2020-04-14 DIAGNOSIS — Z87891 Personal history of nicotine dependence: Secondary | ICD-10-CM | POA: Diagnosis not present

## 2020-04-14 DIAGNOSIS — Z86010 Personal history of colonic polyps: Secondary | ICD-10-CM | POA: Diagnosis not present

## 2020-04-14 DIAGNOSIS — E785 Hyperlipidemia, unspecified: Secondary | ICD-10-CM | POA: Diagnosis not present

## 2020-04-14 DIAGNOSIS — Z7901 Long term (current) use of anticoagulants: Secondary | ICD-10-CM | POA: Diagnosis not present

## 2020-04-14 LAB — CULTURE, BLOOD (ROUTINE X 2): Culture: NO GROWTH

## 2020-04-14 MED ORDER — SODIUM CHLORIDE 0.9 % IV SOLN
Freq: Once | INTRAVENOUS | Status: AC
  Filled 2020-04-14: qty 250

## 2020-04-14 MED ORDER — SODIUM CHLORIDE 0.9 % IV SOLN
510.0000 mg | Freq: Once | INTRAVENOUS | Status: AC
  Administered 2020-04-14: 510 mg via INTRAVENOUS
  Filled 2020-04-14: qty 510

## 2020-04-16 LAB — ANAEROBIC CULTURE

## 2020-04-17 NOTE — Telephone Encounter (Signed)
Error

## 2020-04-19 DIAGNOSIS — L97512 Non-pressure chronic ulcer of other part of right foot with fat layer exposed: Secondary | ICD-10-CM | POA: Diagnosis not present

## 2020-04-21 NOTE — Telephone Encounter (Signed)
Called pt unable to lm

## 2020-04-26 ENCOUNTER — Encounter: Payer: Self-pay | Admitting: Internal Medicine

## 2020-04-26 DIAGNOSIS — K31819 Angiodysplasia of stomach and duodenum without bleeding: Secondary | ICD-10-CM | POA: Diagnosis not present

## 2020-04-27 NOTE — Telephone Encounter (Signed)
LMTCB

## 2020-04-28 ENCOUNTER — Other Ambulatory Visit: Payer: Self-pay | Admitting: Internal Medicine

## 2020-04-28 NOTE — Telephone Encounter (Signed)
LMTCB. He needs to be triaged.

## 2020-04-29 LAB — CULTURE, BLOOD (ROUTINE X 2)
Culture: NO GROWTH
Special Requests: ADEQUATE

## 2020-05-01 ENCOUNTER — Other Ambulatory Visit: Payer: Self-pay | Admitting: Internal Medicine

## 2020-05-02 NOTE — Telephone Encounter (Signed)
Patient stated if he is walking a great distance or is doing something strenuous to make him SOB due to his COPD, he urinates on himself. Sometimes there is a warning other times there is not. Sometimes he has no choice because there is not restroom available. The sx only happen when he is SOB. Patient does not become dizzy, faint, numbing, and tingling. During the times he is SOB. Patient would like a RX astelin refilled by PCP since pulmonary hasnt replied to the pharmacy nor his phone calls. He is frustrated trying to get his medication refilled. Please advise.

## 2020-05-03 ENCOUNTER — Ambulatory Visit (INDEPENDENT_AMBULATORY_CARE_PROVIDER_SITE_OTHER): Payer: PPO | Admitting: Pharmacist

## 2020-05-03 ENCOUNTER — Telehealth: Payer: Self-pay | Admitting: Pharmacist

## 2020-05-03 DIAGNOSIS — F320 Major depressive disorder, single episode, mild: Secondary | ICD-10-CM | POA: Diagnosis not present

## 2020-05-03 DIAGNOSIS — F32A Depression, unspecified: Secondary | ICD-10-CM

## 2020-05-03 DIAGNOSIS — J449 Chronic obstructive pulmonary disease, unspecified: Secondary | ICD-10-CM

## 2020-05-03 DIAGNOSIS — I10 Essential (primary) hypertension: Secondary | ICD-10-CM

## 2020-05-03 DIAGNOSIS — I4892 Unspecified atrial flutter: Secondary | ICD-10-CM

## 2020-05-03 MED ORDER — AZELASTINE HCL 0.1 % NA SOLN: 1.0000 | Freq: Two times a day (BID) | NASAL | 1 refills | Status: AC

## 2020-05-03 NOTE — Telephone Encounter (Signed)
Refilled astelin.  Regarding his bladder control, would recommend voiding prior to walking and physical activity.  Can schedule an appt to discuss - if desires.

## 2020-05-03 NOTE — Patient Instructions (Signed)
Visit Information  Goals Addressed              This Visit's Progress     Patient Stated     PharmD "I want to stay healthy" (pt-stated)        CARE PLAN ENTRY (see longtitudinal plan of care for additional care plan information)  Current Barriers:   Social, financial, or community barriers: o S/p hospitalization for toe amputation, also had EGD. 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 Reports that he is feeling better, but wonders if his Hgb is declining again, as he has started to feel more SOB/dyspneic w/ rest.  o Reports issues w/ bladder incontinence w/ activity. Would like to discuss tx options w/ Dr. Nicki Reaper.  Polypharmacy; complex patient with multiple medical conditions including aflutter, CAD/HTN, COPD/interstitial lung disease, MGUS, Waldenstrom macroglobulinemia, iron deficiency anemia; depression, chronic pain o MGUS, macroglobulinemia, anemia; Dr. Grayland Ormond. EGD w/ ablations of AVS. Last Hgb 10.5 at [facility] on 04/26/20. Has not started folic acid supplement as recommend at hospital discharge (MCV elevated, B12 level at goal, folate low) - Last Feraheme infusion 04/14/20 o COPD: Trelegy daily, Ventolin PRN.  - APPROVED for [place] assistance for Trelegy and Ventolin through 08/18/20 - Reports he has some days of SOB that he will take Ventolin 5-6 times daily. He is unsure if he perceives significant benefit, but states that it helps him "calm down" more  o CAD/HTN/A flutter: Eliquis 5 mg BID, metoprolol tartrate 100 mg BID, isosorbide 30 mg daily  - Reports home readings ~130-140/80s; has self d/c amlodipine that was started while hospitalized. Denies any episodes of dizziness/lightheadedness since coming home - APPROVED for [place] assistance for Eliquis through 08/18/20 o Depression: sertraline 50 mg daily - Reports that with dealing w/ recent hospitalization and pain, he has been more emotionally tired. Reports he will just "sit and cry" with pain. No SI/HI.  o GERD: pantoprazole 40 mg  BID o Chronic pain: Gabapentin 400 mg QAM, 400 mg midday, 800 mg QPM  Pharmacist Clinical Goal(s):   Over the next 90 days, patient will work with PharmD, CPhT, and providers to address needs related to medication access  and ensure optimized medication  Interventions:  Comprehensive medication review performed, medication list updated in electronic medical record  Inter-disciplinary care team collaboration (see longitudinal plan of care)  Never had hospital f/u with PCP. Encouraged patient to call clinic and schedule this ASAP, especially if he thinks Hgb is declining and may need to be rechecked sooner than next hematology f/u. Encouraged patient to discuss his bladder concerns as well. Encouraged patient to void prior to activity as directed by PCP  Encouraged to continue metoprolol tartrate 100 mg BID and isosorbide 30 mg daily as prescribed. Appropriate to stay off amlodipine at this time given reported home BP at a more relaxed goal of <140/90.   Encouraged to discuss mood w/ PCP. Could consider referral to [place] LCSW for mental health support. Did not discuss today.   Reviewed macrocytic anemia, discussed importance of folic acid supplementation as B12 at goal. Encouraged to pick up OTC folic acid supplement. Patient verbalized understanding  Patient Self Care Activities:   Self administers medications as prescribed  Patient will call the office to schedule an appt w/ PCP  Patient will check BP at home periodically, document, and provide for review  Please see past updates related to this goal by clicking on the "Past Updates" button in the selected goal  The patient verbalized understanding of instructions provided today and declined a print copy of patient instruction materials.   Plan:  - Scheduled f/u call in ~ 6-8 weeks   Catie [name], PharmD, [place], [place] Pharmacist [place] [phone]

## 2020-05-03 NOTE — Chronic Care Management (AMB) (Signed)
Chronic Care Management   Follow Up Note   05/03/2020 Name: Troy Lopez MRN: [id number] DOB: [birth year]  Referred by: Einar Pheasant, MD Reason for referral : Chronic Care Management (Medication Management)   Troy Lopez is a 79 y.o. year old male who is a primary care patient of Einar Pheasant, MD. The CCM team was consulted for assistance with chronic disease management and care coordination needs.    Contacted patient for medication management review.  Review of patient status, including review of consultants reports, relevant laboratory and other test results, and collaboration with appropriate care team members and the patient's provider was performed as part of comprehensive patient evaluation and provision of chronic care management services.    SDOH (Social Determinants of Health) assessments performed: Yes See Care Plan activities for detailed interventions related to SDOH)  SDOH Interventions     Most Recent Value  SDOH Interventions  SDOH Interventions for the Following Domains Depression  Financial Strain Interventions Other (Comment)  [manufacturer assistance]  Stress Interventions Other (Comment)  [discussed w/ PCP]  Depression Interventions/Treatment  Currently on Treatment, Medication  [discussed w/ PCP]       Outpatient Encounter Medications as of 05/03/2020  Medication Sig Note  . acetaminophen (TYLENOL) 500 MG tablet Take 500-1,000 mg by mouth 3 (three) times daily as needed for moderate pain or headache. 02/22/2019: 2 tablets 3 times daily   . albuterol (VENTOLIN HFA) 108 (90 Base) MCG/ACT inhaler Inhale 2 puffs into the lungs every 6 (six) hours as needed for wheezing or shortness of breath.   Marland Kitchen azelastine (ASTELIN) 0.1 % nasal spray Place 1 spray into both nostrils 2 (two) times daily. Use in each nostril as directed   . calcium-vitamin D (OSCAL WITH D) 500-200 MG-UNIT tablet Take 2 tablets by mouth daily.   Marland Kitchen ELIQUIS 5 MG TABS tablet Take 5 mg by mouth  2 (two) times daily.    Marland Kitchen FIBER PO Take 1 capsule by mouth daily.    . fluticasone (FLONASE) 50 MCG/ACT nasal spray Place 1 spray into both nostrils daily.   . Fluticasone-Umeclidin-Vilant (TRELEGY ELLIPTA) 100-62.5-25 MCG/INH AEPB Inhale 1 puff into the lungs daily.   . furosemide (LASIX) 20 MG tablet Take 20 mg by mouth 2 (two) times daily. 05/03/2020: Taking 40 mg QAM  . gabapentin (NEURONTIN) 100 MG capsule Take 4 capsules (400 mg) daily in the morning and midday. Take 8 capsules (800 mg) daily in the evening.   . isosorbide mononitrate (IMDUR) 30 MG 24 hr tablet Take 30 mg by mouth daily.   . metoprolol tartrate (LOPRESSOR) 100 MG tablet Take 1 tablet (100 mg total) by mouth 2 (two) times daily.   . pantoprazole (PROTONIX) 40 MG tablet Take 1 tablet (40 mg total) by mouth 2 (two) times daily.   . sertraline (ZOLOFT) 50 MG tablet Take 1 tablet (50 mg total) by mouth daily.   . vitamin B-12 (CYANOCOBALAMIN) 1000 MCG tablet Take 1,000 mcg by mouth daily.   . budesonide (ENTOCORT EC) 3 MG 24 hr capsule Take 9 mg by mouth daily as needed (colitis flare).  (Patient not taking: Reported on 05/03/2020)   . folic acid (FOLVITE) 1 MG tablet Take 1 tablet (1 mg total) by mouth daily. Can take any over-the-counter supplement. (Patient not taking: Reported on 05/03/2020)   . triamcinolone ointment (KENALOG) 0.1 %    . [DISCONTINUED] amLODipine (NORVASC) 10 MG tablet Take 1 tablet (10 mg total) by mouth daily. Blood pressure medication. (Patient  not taking: Reported on 05/03/2020)   . [DISCONTINUED] doxycycline (VIBRAMYCIN) 100 MG capsule Take 100 mg by mouth 2 (two) times daily.    No facility-administered encounter medications on file as of 05/03/2020.     Objective:   Goals Addressed              This Visit's Progress     Patient Stated   .  PharmD "I want to stay healthy" (pt-stated)        CARE PLAN ENTRY (see longtitudinal plan of care for additional care plan information)  Current  Barriers:  . Social, financial, or community barriers: o S/p hospitalization for toe amputation, also had EGD. 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 Reports that he is feeling better, but wonders if his Hgb is declining again, as he has started to feel more SOB/dyspneic w/ rest.  o Reports issues w/ bladder incontinence w/ activity. Would like to discuss tx options w/ Dr. Nicki Reaper. . Polypharmacy; complex patient with multiple medical conditions including aflutter, CAD/HTN, COPD/interstitial lung disease, MGUS, Waldenstrom macroglobulinemia, iron deficiency anemia; depression, chronic pain o MGUS, macroglobulinemia, anemia; Dr. Grayland Ormond. EGD w/ ablations of AVS. Last Hgb 10.5 at [facility] on 04/26/20. Has not started folic acid supplement as recommend at hospital discharge (MCV elevated, B12 level at goal, folate low) - Last Feraheme infusion 04/14/20 o COPD: Trelegy daily, Ventolin PRN.  - APPROVED for [place] assistance for Trelegy and Ventolin through 08/18/20 - Reports he has some days of SOB that he will take Ventolin 5-6 times daily. He is unsure if he perceives significant benefit, but states that it helps him "calm down" more  o CAD/HTN/A flutter: Eliquis 5 mg BID, metoprolol tartrate 100 mg BID, isosorbide 30 mg daily  - Reports home readings ~130-140/80s; has self d/c amlodipine that was started while hospitalized. Denies any episodes of dizziness/lightheadedness since coming home - APPROVED for BMS assistance for Eliquis through 08/18/20 o Depression: sertraline 50 mg daily - Reports that with dealing w/ recent hospitalization and pain, he has been more emotionally tired. Reports he will just "sit and cry" with pain. No SI/HI.  o GERD: pantoprazole 40 mg BID o Chronic pain: Gabapentin 400 mg QAM, 400 mg midday, 800 mg QPM  Pharmacist Clinical Goal(s):  Marland Kitchen Over the next 90 days, patient will work with PharmD, CPhT, and providers to address needs related to medication access  and ensure optimized  medication  Interventions: . Comprehensive medication review performed, medication list updated in electronic medical record . Inter-disciplinary care team collaboration (see longitudinal plan of care) . Never had hospital f/u with PCP. Encouraged patient to call clinic and schedule this ASAP, especially if he thinks Hgb is declining and may need to be rechecked sooner than next hematology f/u. Encouraged patient to discuss his bladder concerns as well. Encouraged patient to void prior to activity as directed by PCP . Encouraged to continue metoprolol tartrate 100 mg BID and isosorbide 30 mg daily as prescribed. Appropriate to stay off amlodipine at this time given reported home BP at a more relaxed goal of <140/90.  . Encouraged to discuss mood w/ PCP. Could consider referral to [place] LCSW for mental health support. Did not discuss today.  . Reviewed macrocytic anemia, discussed importance of folic acid supplementation as B12 at goal. Encouraged to pick up OTC folic acid supplement. Patient verbalized understanding  Patient Self Care Activities:  . Self administers medications as prescribed . Patient will call the office to schedule an appt w/ PCP .  Patient will check BP at home periodically, document, and provide for review  Please see past updates related to this goal by clicking on the "Past Updates" button in the selected goal          Plan:  - Scheduled f/u call in ~ 6-8 weeks   Catie [name], PharmD, [place], [place] Pharmacist [place] [phone]

## 2020-05-03 NOTE — Telephone Encounter (Signed)
Patient returned call. See CCM documentation 

## 2020-05-03 NOTE — Telephone Encounter (Signed)
°  Chronic Care Management   Note  05/03/2020 Name: NAKSH RADI MRN: [id number] DOB: 03/03/1941   Attempted to contact patient for scheduled appointment for medication management support. Left HIPAA compliant message for patient to return my call at their convenience.    Plan: - If I do not hear back from the patient by end of business today, will collaborate with Care Guide to outreach to schedule follow up with me  Catie [name], PharmD, [place], [place] Pharmacist [place] [phone]

## 2020-05-05 DIAGNOSIS — J441 Chronic obstructive pulmonary disease with (acute) exacerbation: Secondary | ICD-10-CM | POA: Diagnosis not present

## 2020-05-10 DIAGNOSIS — L97512 Non-pressure chronic ulcer of other part of right foot with fat layer exposed: Secondary | ICD-10-CM | POA: Diagnosis not present

## 2020-05-23 ENCOUNTER — Other Ambulatory Visit: Payer: Self-pay | Admitting: Internal Medicine

## 2020-05-26 NOTE — Progress Notes (Signed)
[facility]  [phone]) [phone] Fax:(336) [phone]  ID: Troy Lopez OB: 03/12/1941  MR#: [id number]  MEQ#:[id number]  Patient Care Team: Einar Pheasant, MD as PCP - General (Internal Medicine) Lollie Sails, MD (Inactive) as Consulting Physician (Gastroenterology) Ok Edwards, NP as Nurse Practitioner (Gastroenterology) Lloyd Huger, MD as Consulting Physician (Oncology) [name], RPH-CPP as Pharmacist (Pharmacist)  CHIEF COMPLAINT: [name]'s macroglobulinemia, iron deficiency anemia.  INTERVAL HISTORY: Patient returns to clinic today for repeat laboratory work, further evaluation, and continuation of IV Feraheme.  He recently underwent right toe amputation.  He continues to have chronic weakness and fatigue but denies any further falls.  He has no neurologic complaints.  He denies any recent fevers or illnesses. He has a good appetite and denies weight loss.  He denies any chest pain, shortness of breath, cough, or hemoptysis.  He denies any nausea, vomiting, constipation, or diarrhea.  He denies any melena or hematochezia.  He has no urinary complaints.  Patient offers no further specific complaints today.  REVIEW OF SYSTEMS:   Review of Systems  Constitutional: Positive for malaise/fatigue. Negative for fever and weight loss.  Respiratory: Negative.  Negative for cough, hemoptysis and shortness of breath.   Cardiovascular: Negative.  Negative for chest pain and leg swelling.  Gastrointestinal: Negative.  Negative for abdominal pain, blood in stool, diarrhea and melena.  Genitourinary: Negative.  Negative for hematuria.  Musculoskeletal: Negative.  Negative for back pain and falls.  Skin: Negative.  Negative for rash.  Neurological: Positive for weakness. Negative for dizziness, sensory change, focal weakness and headaches.  Psychiatric/Behavioral: The patient is not nervous/anxious and does not have insomnia.      As per HPI. Otherwise, a complete review of systems is negative.  PAST MEDICAL HISTORY: Past Medical History:  Diagnosis Date  . Adrenal gland anomaly    enlargement  . Anemia    IDA  . Anxiety   . CAD (coronary artery disease)   . Carpal tunnel syndrome   . Chronic back pain   . Chronic hyponatremia   . Colitis   . Colonic polyp   . COPD (chronic obstructive pulmonary disease) ([place])   . Degenerative disc disease, lumbar   . Depression   . Diverticulosis   . Dyspnea   . Gastric AVM   . GERD (gastroesophageal reflux disease)   . H/O degenerative disc disease   . Hypercholesterolemia   . Hyperkalemia   . Hyperlipidemia   . Hypertension   . Irritable bowel syndrome   . Monoclonal gammopathy   . Monoclonal gammopathy   . Neuropathy   . Personal history of tobacco use, presenting hazards to health 08/17/2015  . Sleep apnea   . Vertebral compression fracture ([place])     PAST SURGICAL HISTORY: Past Surgical History:  Procedure Laterality Date  . 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/09/2020   Procedure: AMPUTATION TOE;  Surgeon: Sharlotte Alamo, DPM;  Location: [facility] ORS;  Service: Podiatry;  Laterality: Right;  . BUNIONECTOMY  1989  . CARDIAC CATHETERIZATION    . CARPAL TUNNEL RELEASE Left 2011   ulnar nerve sub muscular at elbow  . CHOLECYSTECTOMY  09/07  . COLONOSCOPY WITH PROPOFOL N/A 03/05/2017   Procedure: COLONOSCOPY WITH PROPOFOL;  Surgeon: Lucilla Lame, MD;  Location: [facility] ENDOSCOPY;  Service: Endoscopy;  Laterality: N/A;  . ELECTROPHYSIOLOGIC STUDY N/A 11/15/2015   Procedure: CARDIOVERSION;  Surgeon: Isaias Cowman, MD;  Location: [facility] ORS;  Service: Cardiovascular;  Laterality: N/A;  . ESOPHAGOGASTRODUODENOSCOPY N/A  04/10/2020   Procedure: ESOPHAGOGASTRODUODENOSCOPY (EGD);  Surgeon: Toledo, Benay Pike, MD;  Location: [facility] ENDOSCOPY;  Service: Gastroenterology;  Laterality: N/A;  . ESOPHAGOGASTRODUODENOSCOPY (EGD) WITH PROPOFOL N/A 03/05/2017   Procedure:  ESOPHAGOGASTRODUODENOSCOPY (EGD) WITH PROPOFOL;  Surgeon: Lucilla Lame, MD;  Location: [facility] ENDOSCOPY;  Service: Endoscopy;  Laterality: N/A;  . ESOPHAGOGASTRODUODENOSCOPY (EGD) WITH PROPOFOL N/A 10/17/2017   Procedure: ESOPHAGOGASTRODUODENOSCOPY (EGD) WITH PROPOFOL;  Surgeon: Lollie Sails, MD;  Location: [facility] ENDOSCOPY;  Service: Endoscopy;  Laterality: N/A;  . EYE SURGERY Bilateral 2010   cataract  . HEMORRHOID SURGERY    . KYPHOPLASTY N/A 11/04/2016   Procedure: KYPHOPLASTY T 12;  Surgeon: Hessie Knows, MD;  Location: [facility] ORS;  Service: Orthopedics;  Laterality: N/A;    FAMILY HISTORY Family History  Problem Relation Age of Onset  . Stroke Mother   . Heart disease Father        MI - 52   . Colon cancer Neg Hx   . Prostate cancer Neg Hx        ADVANCED DIRECTIVES:    HEALTH MAINTENANCE: Social History   Tobacco Use  . Smoking status: Former Smoker    Packs/day: 2.00    Years: 58.00    Pack years: 116.00    Types: Cigarettes    Start date: 08/19/1957    Quit date: 11/01/2015    Years since quitting: 4.5  . Smokeless tobacco: Never Used  Vaping Use  . Vaping Use: Never used  Substance Use Topics  . Alcohol use: Yes    Alcohol/week: 42.0 standard drinks    Types: 42 Cans of beer per week    Comment: occas  . Drug use: No     Colonoscopy:  PAP:  Bone density:  Lipid panel:  Allergies  Allergen Reactions  . Iodinated Diagnostic Agents Other (See Comments)    Contraindication secondary to IGMgammopathy/Waldren's syndrome   Not to be given due to Waldenstrom's syndrome, told it could affect kidney function    Current Outpatient Medications  Medication Sig Dispense Refill  . acetaminophen (TYLENOL) 500 MG tablet Take 500-1,000 mg by mouth 3 (three) times daily as needed for moderate pain or headache.    . albuterol (VENTOLIN HFA) 108 (90 Base) MCG/ACT inhaler Inhale 2 puffs into the lungs every 6 (six) hours as needed for wheezing or shortness of breath. 18  g 11  . azelastine (ASTELIN) 0.1 % nasal spray Place 1 spray into both nostrils 2 (two) times daily. Use in each nostril as directed 30 mL 1  . calcium-vitamin D (OSCAL WITH D) 500-200 MG-UNIT tablet Take 2 tablets by mouth daily.    Marland Kitchen ELIQUIS 5 MG TABS tablet Take 5 mg by mouth 2 (two) times daily.     Marland Kitchen FIBER PO Take 1 capsule by mouth daily.     . fluticasone (FLONASE) 50 MCG/ACT nasal spray Place 1 spray into both nostrils daily. 16 g 2  . Fluticasone-Umeclidin-Vilant (TRELEGY ELLIPTA) 100-62.5-25 MCG/INH AEPB Inhale 1 puff into the lungs daily. 1 each 11  . furosemide (LASIX) 20 MG tablet Take 20 mg by mouth 2 (two) times daily.    Marland Kitchen gabapentin (NEURONTIN) 100 MG capsule Take 4 capsules (400 mg) daily in the morning and midday. Take 8 capsules (800 mg) daily in the evening. 480 capsule 0  . isosorbide mononitrate (IMDUR) 30 MG 24 hr tablet Take 30 mg by mouth daily.    . metoprolol tartrate (LOPRESSOR) 100 MG tablet Take 1 tablet (100 mg total)  by mouth 2 (two) times daily. 60 tablet 0  . pantoprazole (PROTONIX) 40 MG tablet Take 1 tablet (40 mg total) by mouth 2 (two) times daily. 180 tablet 0  . sertraline (ZOLOFT) 50 MG tablet Take 1 tablet (50 mg total) by mouth daily. 30 tablet 0  . triamcinolone ointment (KENALOG) 0.1 %     . vitamin B-12 (CYANOCOBALAMIN) 1000 MCG tablet Take 1,000 mcg by mouth daily.    . budesonide (ENTOCORT EC) 3 MG 24 hr capsule Take 9 mg by mouth daily as needed (colitis flare).  (Patient not taking: Reported on 06/02/2020)    . folic acid (FOLVITE) 1 MG tablet Take 1 tablet (1 mg total) by mouth daily. Can take any over-the-counter supplement. (Patient not taking: Reported on 05/03/2020)     No current facility-administered medications for this visit.    OBJECTIVE: Vitals:   06/02/20 1315  BP: 114/64  Pulse: (!) 58  Resp: 18  Temp: 97.9 F (36.6 C)  SpO2: 94%     There is no height or weight on file to calculate BMI.    ECOG FS:1 - Symptomatic but  completely ambulatory  General: Well-developed, well-nourished, no acute distress. Eyes: Pink conjunctiva, anicteric sclera. HEENT: Normocephalic, moist mucous membranes. Lungs: No audible wheezing or coughing. Heart: Regular rate and rhythm. Abdomen: Soft, nontender, no obvious distention. Musculoskeletal: No edema, cyanosis, or clubbing. Neuro: Alert, answering all questions appropriately. Cranial nerves grossly intact. Skin: No rashes or petechiae noted. Psych: Normal affect.   LAB RESULTS:  Lab Results  Component Value Date   NA 127 (L) 04/11/2020   K 3.7 04/11/2020   CL 96 (L) 04/11/2020   CO2 19 (L) 04/11/2020   GLUCOSE 103 (H) 04/11/2020   BUN 13 04/11/2020   CREATININE 0.97 04/11/2020   CALCIUM 8.9 04/11/2020   PROT 8.3 (H) 04/08/2020   ALBUMIN 3.5 04/08/2020   AST 17 04/08/2020   ALT 11 04/08/2020   ALKPHOS 80 04/08/2020   BILITOT 0.9 04/08/2020   GFRNONAA >60 04/11/2020   GFRAA >60 04/11/2020    Lab Results  Component Value Date   WBC 5.6 06/02/2020   NEUTROABS 4.0 06/02/2020   HGB 9.1 (L) 06/02/2020   HCT 27.2 (L) 06/02/2020   MCV 105.8 (H) 06/02/2020   PLT 358 06/02/2020   Lab Results  Component Value Date   IRON 101 04/07/2020   TIBC 910 (H) 04/07/2020   IRONPCTSAT 11 (L) 04/07/2020    Lab Results  Component Value Date   FERRITIN 69 04/07/2020   Lab Results  Component Value Date   TOTALPROTELP 8.2 10/19/2019   ALBUMINELP 3.4 10/19/2019   A1GS 0.3 10/19/2019   A2GS 0.6 10/19/2019   BETS 1.6 (H) 10/19/2019   BETA2SER 0.2 02/14/2016   GAMS 2.3 (H) 10/19/2019   MSPIKE Not Observed 10/19/2019   SPEI Comment 10/19/2019    STUDIES: No results found.  ASSESSMENT: [name]'s macroglobulinemia, iron deficiency anemia.  PLAN:    1. [name]'s macroglobulinemia:  Bone marrow biopsy results from Jan 08, 2017 reviewed independently confirming underlying [name]'s.  Patient's M spike has been erratic ranging from 1.1 in May 2018 to  as high as 2.6 in October 2018.  His most recent results on October 19, 2019 did not reveal an M spike, but continued to have a persistently elevated IgM level of 3755.  Kappa free light chains are essentially unchanged at 75.3. He has persistent iron deficiency anemia from a GI source, but no other evidence of endorgan  damage. He does not require any treatment at this time. If he had progression of disease as evidenced by B-symptoms or endorgan damage, will consider Rituxan-based therapy.  Continue to monitor every 3 to 6 months.  2. 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  His hemoglobin has trended up slightly to 9.1, iron stores are pending at time of dictation.  Proceed with 510 mg IV Feraheme today.  Return to clinic next week for second infusion.  Patient would then return to clinic in 2 months for repeat laboratory work and further evaluation.  3.  Back pain: Chronic and unchanged.  Patient does not complain of this today.  Continue monitoring and treatment per pain clinic. 4.  Insomnia: Patient does not complain of this today.  Possibly secondary to alcohol consumption at night.  Monitor.  5.  Toe infection: Patient underwent amputation in August 2021.  I spent a total of 30 minutes reviewing chart data, face-to-face evaluation with the patient, counseling and coordination of care as detailed above.    Patient expressed understanding and was in agreement with this plan. He also understands that He can call clinic at any time with any questions, concerns, or complaints.    Lloyd Huger, MD   06/02/2020 1:57 PM

## 2020-05-26 NOTE — Telephone Encounter (Signed)
Pt aware.

## 2020-06-01 ENCOUNTER — Other Ambulatory Visit: Payer: Self-pay | Admitting: *Deleted

## 2020-06-01 DIAGNOSIS — D509 Iron deficiency anemia, unspecified: Secondary | ICD-10-CM

## 2020-06-02 ENCOUNTER — Inpatient Hospital Stay (HOSPITAL_BASED_OUTPATIENT_CLINIC_OR_DEPARTMENT_OTHER): Payer: PPO | Admitting: Oncology

## 2020-06-02 ENCOUNTER — Encounter: Payer: Self-pay | Admitting: Oncology

## 2020-06-02 ENCOUNTER — Other Ambulatory Visit: Payer: Self-pay

## 2020-06-02 ENCOUNTER — Inpatient Hospital Stay: Payer: PPO

## 2020-06-02 ENCOUNTER — Inpatient Hospital Stay: Payer: PPO | Attending: Oncology

## 2020-06-02 VITALS — BP 114/64 | HR 58 | Temp 97.9°F | Resp 18

## 2020-06-02 VITALS — BP 135/77 | HR 60 | Resp 18

## 2020-06-02 DIAGNOSIS — Z87891 Personal history of nicotine dependence: Secondary | ICD-10-CM | POA: Diagnosis not present

## 2020-06-02 DIAGNOSIS — D509 Iron deficiency anemia, unspecified: Secondary | ICD-10-CM

## 2020-06-02 DIAGNOSIS — Z823 Family history of stroke: Secondary | ICD-10-CM | POA: Insufficient documentation

## 2020-06-02 DIAGNOSIS — C880 Waldenstrom macroglobulinemia: Secondary | ICD-10-CM | POA: Insufficient documentation

## 2020-06-02 DIAGNOSIS — M549 Dorsalgia, unspecified: Secondary | ICD-10-CM | POA: Diagnosis not present

## 2020-06-02 DIAGNOSIS — Z7289 Other problems related to lifestyle: Secondary | ICD-10-CM | POA: Diagnosis not present

## 2020-06-02 DIAGNOSIS — E785 Hyperlipidemia, unspecified: Secondary | ICD-10-CM | POA: Diagnosis not present

## 2020-06-02 DIAGNOSIS — K219 Gastro-esophageal reflux disease without esophagitis: Secondary | ICD-10-CM | POA: Insufficient documentation

## 2020-06-02 DIAGNOSIS — R5383 Other fatigue: Secondary | ICD-10-CM | POA: Insufficient documentation

## 2020-06-02 DIAGNOSIS — Z8249 Family history of ischemic heart disease and other diseases of the circulatory system: Secondary | ICD-10-CM | POA: Diagnosis not present

## 2020-06-02 DIAGNOSIS — J449 Chronic obstructive pulmonary disease, unspecified: Secondary | ICD-10-CM | POA: Insufficient documentation

## 2020-06-02 DIAGNOSIS — G4700 Insomnia, unspecified: Secondary | ICD-10-CM | POA: Insufficient documentation

## 2020-06-02 DIAGNOSIS — I2510 Atherosclerotic heart disease of native coronary artery without angina pectoris: Secondary | ICD-10-CM | POA: Insufficient documentation

## 2020-06-02 DIAGNOSIS — L089 Local infection of the skin and subcutaneous tissue, unspecified: Secondary | ICD-10-CM | POA: Diagnosis not present

## 2020-06-02 DIAGNOSIS — R531 Weakness: Secondary | ICD-10-CM | POA: Diagnosis not present

## 2020-06-02 DIAGNOSIS — Z79899 Other long term (current) drug therapy: Secondary | ICD-10-CM | POA: Insufficient documentation

## 2020-06-02 LAB — CBC WITH DIFFERENTIAL/PLATELET
Abs Immature Granulocytes: 0.03 10*3/uL (ref 0.00–0.07)
Basophils Absolute: 0.1 10*3/uL (ref 0.0–0.1)
Basophils Relative: 1 %
Eosinophils Absolute: 0 10*3/uL (ref 0.0–0.5)
Eosinophils Relative: 1 %
HCT: 27.2 % — ABNORMAL LOW (ref 39.0–52.0)
Hemoglobin: 9.1 g/dL — ABNORMAL LOW (ref 13.0–17.0)
Immature Granulocytes: 1 %
Lymphocytes Relative: 18 %
Lymphs Abs: 1 10*3/uL (ref 0.7–4.0)
MCH: 35.4 pg — ABNORMAL HIGH (ref 26.0–34.0)
MCHC: 33.5 g/dL (ref 30.0–36.0)
MCV: 105.8 fL — ABNORMAL HIGH (ref 80.0–100.0)
Monocytes Absolute: 0.5 10*3/uL (ref 0.1–1.0)
Monocytes Relative: 9 %
Neutro Abs: 4 10*3/uL (ref 1.7–7.7)
Neutrophils Relative %: 70 %
Platelets: 358 10*3/uL (ref 150–400)
RBC: 2.57 MIL/uL — ABNORMAL LOW (ref 4.22–5.81)
RDW: 14.6 % (ref 11.5–15.5)
WBC: 5.6 10*3/uL (ref 4.0–10.5)
nRBC: 0 % (ref 0.0–0.2)

## 2020-06-02 LAB — FERRITIN: Ferritin: 23 ng/mL — ABNORMAL LOW (ref 24–336)

## 2020-06-02 LAB — IRON AND TIBC
Iron: 89 ug/dL (ref 45–182)
Saturation Ratios: 10 % — ABNORMAL LOW (ref 17.9–39.5)
TIBC: 906 ug/dL — ABNORMAL HIGH (ref 250–450)
UIBC: 817 ug/dL

## 2020-06-02 LAB — SAMPLE TO BLOOD BANK

## 2020-06-02 MED ORDER — SODIUM CHLORIDE 0.9 % IV SOLN
Freq: Once | INTRAVENOUS | Status: AC
  Filled 2020-06-02: qty 250

## 2020-06-02 MED ORDER — SODIUM CHLORIDE 0.9 % IV SOLN
510.0000 mg | Freq: Once | INTRAVENOUS | Status: AC
  Administered 2020-06-02: 510 mg via INTRAVENOUS
  Filled 2020-06-02: qty 17

## 2020-06-02 NOTE — Progress Notes (Signed)
Patient denies any concerns today.  

## 2020-06-04 DIAGNOSIS — J441 Chronic obstructive pulmonary disease with (acute) exacerbation: Secondary | ICD-10-CM | POA: Diagnosis not present

## 2020-06-07 ENCOUNTER — Other Ambulatory Visit: Payer: Self-pay

## 2020-06-07 ENCOUNTER — Inpatient Hospital Stay: Payer: PPO

## 2020-06-07 ENCOUNTER — Other Ambulatory Visit: Payer: Self-pay | Admitting: Internal Medicine

## 2020-06-07 VITALS — BP 132/82 | HR 60 | Temp 97.9°F | Resp 20

## 2020-06-07 DIAGNOSIS — C880 Waldenstrom macroglobulinemia: Secondary | ICD-10-CM | POA: Diagnosis not present

## 2020-06-07 DIAGNOSIS — D509 Iron deficiency anemia, unspecified: Secondary | ICD-10-CM

## 2020-06-07 MED ORDER — SODIUM CHLORIDE 0.9 % IV SOLN
510.0000 mg | Freq: Once | INTRAVENOUS | Status: AC
  Administered 2020-06-07: 510 mg via INTRAVENOUS
  Filled 2020-06-07: qty 510

## 2020-06-07 MED ORDER — SODIUM CHLORIDE 0.9 % IV SOLN
Freq: Once | INTRAVENOUS | Status: AC
  Filled 2020-06-07: qty 250

## 2020-06-07 NOTE — Progress Notes (Signed)
1224- Patient declined/refused to stay the 30-minute post-Feraheme observation time period. Patient and vital signs stable at discharge.

## 2020-06-13 ENCOUNTER — Encounter: Payer: Self-pay | Admitting: Internal Medicine

## 2020-06-13 ENCOUNTER — Other Ambulatory Visit: Payer: Self-pay

## 2020-06-13 ENCOUNTER — Ambulatory Visit (INDEPENDENT_AMBULATORY_CARE_PROVIDER_SITE_OTHER): Payer: PPO | Admitting: Internal Medicine

## 2020-06-13 DIAGNOSIS — F320 Major depressive disorder, single episode, mild: Secondary | ICD-10-CM

## 2020-06-13 DIAGNOSIS — K219 Gastro-esophageal reflux disease without esophagitis: Secondary | ICD-10-CM

## 2020-06-13 DIAGNOSIS — J849 Interstitial pulmonary disease, unspecified: Secondary | ICD-10-CM

## 2020-06-13 DIAGNOSIS — E871 Hypo-osmolality and hyponatremia: Secondary | ICD-10-CM | POA: Diagnosis not present

## 2020-06-13 DIAGNOSIS — G629 Polyneuropathy, unspecified: Secondary | ICD-10-CM

## 2020-06-13 DIAGNOSIS — I10 Essential (primary) hypertension: Secondary | ICD-10-CM | POA: Diagnosis not present

## 2020-06-13 DIAGNOSIS — I272 Pulmonary hypertension, unspecified: Secondary | ICD-10-CM

## 2020-06-13 DIAGNOSIS — I4892 Unspecified atrial flutter: Secondary | ICD-10-CM

## 2020-06-13 DIAGNOSIS — K52831 Collagenous colitis: Secondary | ICD-10-CM

## 2020-06-13 DIAGNOSIS — D472 Monoclonal gammopathy: Secondary | ICD-10-CM

## 2020-06-13 DIAGNOSIS — I2510 Atherosclerotic heart disease of native coronary artery without angina pectoris: Secondary | ICD-10-CM | POA: Diagnosis not present

## 2020-06-13 DIAGNOSIS — D509 Iron deficiency anemia, unspecified: Secondary | ICD-10-CM | POA: Diagnosis not present

## 2020-06-13 DIAGNOSIS — F32A Depression, unspecified: Secondary | ICD-10-CM

## 2020-06-13 DIAGNOSIS — G8929 Other chronic pain: Secondary | ICD-10-CM

## 2020-06-13 DIAGNOSIS — J449 Chronic obstructive pulmonary disease, unspecified: Secondary | ICD-10-CM

## 2020-06-13 DIAGNOSIS — M549 Dorsalgia, unspecified: Secondary | ICD-10-CM

## 2020-06-13 DIAGNOSIS — E785 Hyperlipidemia, unspecified: Secondary | ICD-10-CM | POA: Diagnosis not present

## 2020-06-13 NOTE — Progress Notes (Signed)
Patient states his blood pressure has been going up and down. States that he feel about 2-3 months ago due to loss of consciousness due to low blood pressure.   BP was 146/82 this morning, Patient took his medications today at 10:00 am.

## 2020-06-13 NOTE — Progress Notes (Signed)
Patient ID: Troy Lopez, male   DOB: [birth year], 79 y.o.   MRN: [id number]

## 2020-06-13 NOTE — Progress Notes (Signed)
Patient ID: Troy Lopez, male   DOB: Feb 25, 1941, 79 y.o.   MRN: [id number]   Subjective:    Patient ID: Troy Lopez, male    DOB: [birth year], 79 y.o.   MRN: [id number]  HPI This visit occurred during the SARS-CoV-2 public health emergency.  Safety protocols were in place, including screening questions prior to the visit, additional usage of staff PPE, and extensive cleaning of exam room while observing appropriate contact time as indicated for disinfecting solutions.  Patient here for a scheduled follow up.  Here to follow up regarding his blood pressure.  States has been varying.  Running low recently.  He has adjusted his medication.  Taking 1/2 metoprolol bid.  Off isosorbide.  States blood pressures averaging:  116-126/60s.  Feels better off isosorbide.  No chest pain reported.  Breathing stable.  Increased back pain.  No radiation down legs.  Back brace helps.  Request referral to [facility] PT.  Seeing hematology for f/u IDA and waldenstrom's macroglobulinemia.  Receiving iron infusions.  Using trelegy in am.  Feels more sob on some days.  On averaging using rescue inhaler 4-5x/day.  Has f/u with pulmonary tomorrow.  Bowels stable.    Past Medical History:  Diagnosis Date  . Adrenal gland anomaly    enlargement  . Anemia    IDA  . Anxiety   . CAD (coronary artery disease)   . Carpal tunnel syndrome   . Chronic back pain   . Chronic hyponatremia   . Colitis   . Colonic polyp   . COPD (chronic obstructive pulmonary disease) ([place])   . Degenerative disc disease, lumbar   . Depression   . Diverticulosis   . Dyspnea   . Gastric AVM   . GERD (gastroesophageal reflux disease)   . H/O degenerative disc disease   . Hypercholesterolemia   . Hyperkalemia   . Hyperlipidemia   . Hypertension   . Irritable bowel syndrome   . Monoclonal gammopathy   . Monoclonal gammopathy   . Neuropathy   . Personal history of tobacco use, presenting hazards to health 08/17/2015  . Sleep apnea   .  Vertebral compression fracture [facility])    Past Surgical History:  Procedure Laterality Date  . 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/09/2020   Procedure: AMPUTATION TOE;  Surgeon: Sharlotte Alamo, DPM;  Location: [facility] ORS;  Service: Podiatry;  Laterality: Right;  . BUNIONECTOMY  1989  . CARDIAC CATHETERIZATION    . CARPAL TUNNEL RELEASE Left 2011   ulnar nerve sub muscular at elbow  . CHOLECYSTECTOMY  09/07  . COLONOSCOPY WITH PROPOFOL N/A 03/05/2017   Procedure: COLONOSCOPY WITH PROPOFOL;  Surgeon: Lucilla Lame, MD;  Location: [facility] ENDOSCOPY;  Service: Endoscopy;  Laterality: N/A;  . ELECTROPHYSIOLOGIC STUDY N/A 11/15/2015   Procedure: CARDIOVERSION;  Surgeon: Isaias Cowman, MD;  Location: [facility] ORS;  Service: Cardiovascular;  Laterality: N/A;  . ESOPHAGOGASTRODUODENOSCOPY N/A 04/10/2020   Procedure: ESOPHAGOGASTRODUODENOSCOPY (EGD);  Surgeon: Toledo, Benay Pike, MD;  Location: [facility] ENDOSCOPY;  Service: Gastroenterology;  Laterality: N/A;  . ESOPHAGOGASTRODUODENOSCOPY (EGD) WITH PROPOFOL N/A 03/05/2017   Procedure: ESOPHAGOGASTRODUODENOSCOPY (EGD) WITH PROPOFOL;  Surgeon: Lucilla Lame, MD;  Location: [facility] ENDOSCOPY;  Service: Endoscopy;  Laterality: N/A;  . ESOPHAGOGASTRODUODENOSCOPY (EGD) WITH PROPOFOL N/A 10/17/2017   Procedure: ESOPHAGOGASTRODUODENOSCOPY (EGD) WITH PROPOFOL;  Surgeon: Lollie Sails, MD;  Location: [facility] ENDOSCOPY;  Service: Endoscopy;  Laterality: N/A;  . EYE SURGERY Bilateral 2010   cataract  . HEMORRHOID SURGERY    . KYPHOPLASTY N/A 11/04/2016  Procedure: KYPHOPLASTY T 12;  Surgeon: Hessie Knows, MD;  Location: [facility] ORS;  Service: Orthopedics;  Laterality: N/A;   Family History  Problem Relation Age of Onset  . Stroke Mother   . Heart disease Father        MI - 24   . Colon cancer Neg Hx   . Prostate cancer Neg Hx    Social History   Socioeconomic History  . Marital status: Married    Spouse name: Not on file  . Number of children: 3  . Years of education: Not on file  .  Highest education level: Not on file  Occupational History  . Not on file  Tobacco Use  . Smoking status: Former Smoker    Packs/day: 2.00    Years: 58.00    Pack years: 116.00    Types: Cigarettes    Start date: 08/19/1957    Quit date: 11/01/2015    Years since quitting: 4.6  . Smokeless tobacco: Never Used  Vaping Use  . Vaping Use: Never used  Substance and Sexual Activity  . Alcohol use: Yes    Alcohol/week: 42.0 standard drinks    Types: 42 Cans of beer per week    Comment: occas  . Drug use: No  . Sexual activity: Not on file  Other Topics Concern  . Not on file  Social History Narrative  . Not on file   Social Determinants of Health   Financial Resource Strain: Low Risk   . Difficulty of Paying Living Expenses: Not hard at all  Food Insecurity:   . Worried About Charity fundraiser in the Last Year: Not on file  . Ran Out of Food in the Last Year: Not on file  Transportation Needs:   . Lack of Transportation (Medical): Not on file  . Lack of Transportation (Non-Medical): Not on file  Physical Activity:   . Days of Exercise per Week: Not on file  . Minutes of Exercise per Session: Not on file  Stress: Stress Concern Present  . Feeling of Stress : Rather much  Social Connections:   . Frequency of Communication with Friends and Family: Not on file  . Frequency of Social Gatherings with Friends and Family: Not on file  . Attends Religious Services: Not on file  . Active Member of Clubs or Organizations: Not on file  . Attends Archivist Meetings: Not on file  . Marital Status: Not on file   Outpatient Encounter Medications as of 06/13/2020  Medication Sig  . acetaminophen (TYLENOL) 500 MG tablet Take 500-1,000 mg by mouth 3 (three) times daily as needed for moderate pain or headache.  . albuterol (VENTOLIN HFA) 108 (90 Base) MCG/ACT inhaler Inhale 2 puffs into the lungs every 6 (six) hours as needed for wheezing or shortness of breath.  Marland Kitchen azelastine  (ASTELIN) 0.1 % nasal spray Place 1 spray into both nostrils 2 (two) times daily. Use in each nostril as directed  . budesonide (ENTOCORT EC) 3 MG 24 hr capsule Take 9 mg by mouth daily as needed (colitis flare).   . calcium-vitamin D (OSCAL WITH D) 500-200 MG-UNIT tablet Take 2 tablets by mouth daily.  Marland Kitchen ELIQUIS 5 MG TABS tablet Take 5 mg by mouth 2 (two) times daily.   Marland Kitchen FIBER PO Take 1 capsule by mouth daily.   . fluticasone (FLONASE) 50 MCG/ACT nasal spray Place 1 spray into both nostrils daily.  . Fluticasone-Umeclidin-Vilant (TRELEGY ELLIPTA) 100-62.5-25 MCG/INH AEPB Inhale 1  puff into the lungs daily.  . folic acid (FOLVITE) 1 MG tablet Take 1 tablet (1 mg total) by mouth daily. Can take any over-the-counter supplement.  . furosemide (LASIX) 20 MG tablet Take 20 mg by mouth 2 (two) times daily.  Marland Kitchen gabapentin (NEURONTIN) 100 MG capsule Take 4 capsules (400 mg) daily in the morning and midday. Take 8 capsules (800 mg) daily in the evening.  . isosorbide mononitrate (IMDUR) 30 MG 24 hr tablet Take 30 mg by mouth daily.  . metoprolol tartrate (LOPRESSOR) 100 MG tablet Take 1 tablet (100 mg total) by mouth 2 (two) times daily.  . pantoprazole (PROTONIX) 40 MG tablet Take 1 tablet (40 mg total) by mouth 2 (two) times daily.  . sertraline (ZOLOFT) 50 MG tablet Take 1 tablet (50 mg total) by mouth daily.  Marland Kitchen triamcinolone ointment (KENALOG) 0.1 %   . vitamin B-12 (CYANOCOBALAMIN) 1000 MCG tablet Take 1,000 mcg by mouth daily.   No facility-administered encounter medications on file as of 06/13/2020.    Review of Systems  Constitutional: Negative for appetite change and unexpected weight change.  HENT: Negative for congestion and sinus pressure.   Respiratory: Negative for cough and chest tightness.        SOB as outlined.    Cardiovascular: Negative for chest pain, palpitations and leg swelling.  Gastrointestinal: Negative for abdominal pain, diarrhea and nausea.  Genitourinary: Negative for  difficulty urinating and dysuria.  Musculoskeletal: Positive for back pain. Negative for joint swelling.  Skin: Negative for color change and rash.  Neurological: Negative for dizziness, light-headedness and headaches.  Psychiatric/Behavioral: Negative for agitation and dysphoric mood.       Objective:    Physical Exam Vitals reviewed.  Constitutional:      General: He is not in acute distress.    Appearance: Normal appearance. He is well-developed.  HENT:     Head: Normocephalic and atraumatic.     Right Ear: External ear normal.     Left Ear: External ear normal.  Eyes:     General: No scleral icterus.       Right eye: No discharge.        Left eye: No discharge.     Conjunctiva/sclera: Conjunctivae normal.  Cardiovascular:     Rate and Rhythm: Normal rate and regular rhythm.  Pulmonary:     Effort: Pulmonary effort is normal. No respiratory distress.     Breath sounds: Normal breath sounds.  Abdominal:     General: Bowel sounds are normal.     Palpations: Abdomen is soft.     Tenderness: There is no abdominal tenderness.  Musculoskeletal:        General: No swelling or tenderness.  Skin:    Findings: No erythema or rash.  Neurological:     Mental Status: He is alert.  Psychiatric:        Mood and Affect: Mood normal.        Behavior: Behavior normal.     BP (!) 146/82   Pulse (!) 59   Temp 97.6 F (36.4 C) (Oral)   Ht 5\' 10"  (1.778 m)   Wt 177 lb 12.8 oz (80.6 kg)   SpO2 95%   BMI 25.51 kg/m  Wt Readings from Last 3 Encounters:  06/14/20 178 lb 9.6 oz (81 kg)  06/13/20 177 lb 12.8 oz (80.6 kg)  04/08/20 177 lb (80.3 kg)     Lab Results  Component Value Date   WBC 5.6 06/02/2020  HGB 9.1 (L) 06/02/2020   HCT 27.2 (L) 06/02/2020   PLT 358 06/02/2020   GLUCOSE 103 (H) 04/11/2020   CHOL 145 01/24/2020   TRIG 56.0 01/24/2020   HDL 68.30 01/24/2020   LDLCALC 65 01/24/2020   ALT 11 04/08/2020   AST 17 04/08/2020   NA 127 (L) 04/11/2020   K 3.7  04/11/2020   CL 96 (L) 04/11/2020   CREATININE 0.97 04/11/2020   BUN 13 04/11/2020   CO2 19 (L) 04/11/2020   TSH 1.98 01/24/2020   PSA 1.48 01/24/2020   INR 1.11 01/08/2017   HGBA1C 4.6 01/24/2020   MICROALBUR 1.9 10/27/2017    MR FOOT RIGHT WO CONTRAST  Result Date: 04/09/2020 CLINICAL DATA:  History of a skin ulceration on the right second toe. The patient is status post debridement of the wound 04/05/2020. Continued redness, pain and swelling. EXAM: MRI OF THE RIGHT FOREFOOT WITHOUT CONTRAST TECHNIQUE: Multiplanar, multisequence MR imaging of the right forefoot was performed. No intravenous contrast was administered. COMPARISON:  Plain films right foot 04/09/2020. FINDINGS: Bones/Joint/Cartilage There is no marrow edema to suggest osteomyelitis. The patient has remote healed fractures of the distal first and proximal second metatarsals. No acute fracture is identified. The PIP joint of the second toe is laterally subluxed as seen on the prior plain films. Hallux valgus deformity is noted. 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 Ligaments The medial collateral ligament of the PIP joint of the second toe is difficult to visualize and may be torn. Otherwise negative. Muscles and Tendons No intramuscular fluid collection, tear or strain. Fatty atrophy of intrinsic musculature the foot noted. Soft tissues No focal fluid collection. Subcutaneous edema is seen in the dorsum of the foot. IMPRESSION: Negative for osteomyelitis, abscess or septic joint. Lateral subluxation PIP joint of the second toe. The medial collateral ligament of the PIP joint is not visible and may be torn. Remote healed first and second metatarsal fractures. Hallux valgus and first MTP osteoarthritis. Subcutaneous edema in the dorsum of the foot could be due to dependent change or cellulitis. Electronically Signed   By: Inge Rise M.D.   On: 04/09/2020 14:21   DG Foot Complete Right  Result Date: 04/09/2020 CLINICAL DATA:  Foot  swelling, evaluate for osteomyelitis EXAM: RIGHT FOOT COMPLETE - 3+ VIEW COMPARISON:  Radiograph 10/09/2017 FINDINGS: Diffuse circumferential swelling of the foot. Questionable ulceration along the plantar and lateral aspect adjacent the head of the fifth metatarsal. No other radiographically evident sites of ulceration are evident. No soft tissue gas or foreign body is seen. No osseous erosion, destructive change or periostitis to suggest osteomyelitis at this location or elsewhere within the foot at this time. There are posttraumatic deformities of the first metatarsal and second metatarsal base. There is a volar dislocation of the second proximal interphalangeal joint. 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 Hallux valgus deformity with chronic soft tissue thickening along the medial head of the first metatarsal. Vascular calcium in the soft tissues. IMPRESSION: 1. Questionable ulceration along the plantar and lateral aspect adjacent the head of the fifth metatarsal. Correlate with visual inspection. 2. No other radiographically evident sites of ulceration within the foot at this time. 3. No radiographic evidence of osteomyelitis. If there is persisting clinical concern, MRI imaging is much more sensitive and specific for early features of osteomyelitis. 4. 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  5. Volar dislocation of the second proximal interphalangeal joint, new from prior. 6. Remote posttraumatic deformity of the first and second metatarsals. 7. Hallux valgus. Electronically Signed   By: Lovena Le M.D.   On: 04/09/2020 02:14       Assessment & Plan:   Problem List Items Addressed This Visit    Pulmonary hypertension ([place])    Followed by pulmonary and cardiology.       Neuropathy    Stable. On gabapentin.       Moderate COPD (chronic obstructive  pulmonary disease) ([place])    Continue trelegy.  Using rescue inhaler.  F/u as outlined.       Mild depression (H[facility])    Overall appears to be stable.  Follow.       MGUS (monoclonal gammopathy of unknown significance)    Followed by hematology.       Iron deficiency anemia    Followed by hematology.  Receiving iron infusions.        ILD (interstitial lung disease) ([place])    Using trelegy and rescue inhaler as outlined.  Some days with more sob as outlined.  Has f/u with pulmonary tomorrow.  No acute cough or congestion.       Hyponatremia    Has been worked up extensively.  Sodium stable.  Follow.  Needs to decrease alcohol intake.        Hypertension    Blood pressure as outlined.  Off isosorbide.  On 1/2 metoprolol.  Blood pressures as outlined.  Feels better.  Follow pressures.  Follow metabolic panel.       Hyperlipemia    Low cholesterol diet and exercise.  Follow lipid panel.        GERD (gastroesophageal reflux disease)    No upper symptoms reported. On protonix.        Collagenous colitis    Bowels stable.  Followed by GI.       Chronic back pain    Has seen Dr Sharlet Salina.  Persistent low back pain.  No radiation of symptoms.  Request referral to [facility] PT.        CAD (coronary artery disease)    Continue risk factor modification.  Followed by cardiology.       Atrial flutter ([place])    S/p cardioversion.  On eliquis.  Stable.  Continue f/u with cardiology.           Einar Pheasant, MD

## 2020-06-14 ENCOUNTER — Ambulatory Visit (INDEPENDENT_AMBULATORY_CARE_PROVIDER_SITE_OTHER): Payer: PPO | Admitting: Primary Care

## 2020-06-14 ENCOUNTER — Encounter: Payer: Self-pay | Admitting: Primary Care

## 2020-06-14 VITALS — BP 150/90 | HR 61 | Temp 97.3°F | Ht 70.0 in | Wt 178.6 lb

## 2020-06-14 DIAGNOSIS — G4734 Idiopathic sleep related nonobstructive alveolar hypoventilation: Secondary | ICD-10-CM

## 2020-06-14 DIAGNOSIS — J449 Chronic obstructive pulmonary disease, unspecified: Secondary | ICD-10-CM | POA: Diagnosis not present

## 2020-06-14 DIAGNOSIS — J441 Chronic obstructive pulmonary disease with (acute) exacerbation: Secondary | ICD-10-CM | POA: Diagnosis not present

## 2020-06-14 MED ORDER — BREZTRI AEROSPHERE 160-9-4.8 MCG/ACT IN AERO: 2.0000 | INHALATION_SPRAY | Freq: Two times a day (BID) | RESPIRATORY_TRACT | 0 refills | Status: AC

## 2020-06-14 NOTE — Patient Instructions (Addendum)
Recommendations: - Stop Trelegy; Start Breztri two puffs twice daily (rinse mouth after use) - Continue to Ventolin rescue inhaler 2 puffs every 4-6 hours for breakthrough shortness of breath or wheezing - Take mucinex 600mg  twice daily for 2 week, if this helps continue taking it daily - After completing PT for back recommend we refer you to pulmonary rehab   Orders: - Alpha 1 antitrypsin phenotype - Flutter valve  - Portable oxygen concentrator for nocturnal oxygen   Follow-up: - 2 week televisit with Beth NP or sooner if you develop purulent mucus or worsening sob/wheezing     Chronic Obstructive Pulmonary Disease Chronic obstructive pulmonary disease (COPD) is a long-term (chronic) lung problem. When you have COPD, it is hard for air to get in and out of your lungs. Usually the condition gets worse over time, and your lungs will never return to normal. There are things you can do to keep yourself as healthy as possible.  Your doctor may treat your condition with: ? Medicines. ? Oxygen. ? Lung surgery.  Your doctor may also recommend: ? Rehabilitation. This includes steps to make your body work better. It may involve a team of specialists. ? Quitting smoking, if you smoke. ? Exercise and changes to your diet. ? Comfort measures (palliative care). Follow these instructions at home: Medicines  Take over-the-counter and prescription medicines only as told by your doctor.  Talk to your doctor before taking any cough or allergy medicines. You may need to avoid medicines that cause your lungs to be dry. Lifestyle  If you smoke, stop. Smoking makes the problem worse. If you need help quitting, ask your doctor.  Avoid being around things that make your breathing worse. This may include smoke, chemicals, and fumes.  Stay active, but remember to rest as well.  Learn and use tips on how to relax.  Make sure you get enough sleep. Most adults need at least 7 hours of sleep every  night.  Eat healthy foods. Eat smaller meals more often. Rest before meals. Controlled breathing Learn and use tips on how to control your breathing as told by your doctor. Try:  Breathing in (inhaling) through your nose for 1 second. Then, pucker your lips and breath out (exhale) through your lips for 2 seconds.  Putting one hand on your belly (abdomen). Breathe in slowly through your nose for 1 second. Your hand on your belly should move out. Pucker your lips and breathe out slowly through your lips. Your hand on your belly should move in as you breathe out.  Controlled coughing Learn and use controlled coughing to clear mucus from your lungs. Follow these steps: 1. Lean your head a little forward. 2. Breathe in deeply. 3. Try to hold your breath for 3 seconds. 4. Keep your mouth slightly open while coughing 2 times. 5. Spit any mucus out into a tissue. 6. Rest and do the steps again 1 or 2 times as needed. General instructions  Make sure you get all the shots (vaccines) that your doctor recommends. Ask your doctor about a flu shot and a pneumonia shot.  Use oxygen therapy and pulmonary rehabilitation if told by your doctor. If you need home oxygen therapy, ask your doctor if you should buy a tool to measure your oxygen level (oximeter).  Make a COPD action plan with your doctor. This helps you to know what to do if you feel worse than usual.  Manage any other conditions you have as told by your doctor.  Avoid going outside when it is very hot, cold, or humid.  Avoid people who have a sickness you can catch (contagious).  Keep all follow-up visits as told by your doctor. This is important. Contact a doctor if:  You cough up more mucus than usual.  There is a change in the color or thickness of the mucus.  It is harder to breathe than usual.  Your breathing is faster than usual.  You have trouble sleeping.  You need to use your medicines more often than usual.  You  have trouble doing your normal activities such as getting dressed or walking around the house. Get help right away if:  You have shortness of breath while resting.  You have shortness of breath that stops you from: ? Being able to talk. ? Doing normal activities.  Your chest hurts for longer than 5 minutes.  Your skin color is more blue than usual.  Your pulse oximeter shows that you have low oxygen for longer than 5 minutes.  You have a fever.  You feel too tired to breathe normally. Summary  Chronic obstructive pulmonary disease (COPD) is a long-term lung problem.  The way your lungs work will never return to normal. Usually the condition gets worse over time. There are things you can do to keep yourself as healthy as possible.  Take over-the-counter and prescription medicines only as told by your doctor.  If you smoke, stop. Smoking makes the problem worse. This information is not intended to replace advice given to you by your health care provider. Make sure you discuss any questions you have with your health care provider. Document Revised: 07/18/2017 Document Reviewed: 09/09/2016 Elsevier Patient Education  2020 [employer].

## 2020-06-14 NOTE — Progress Notes (Addendum)
@Patient  ID: Troy Lopez, male    DOB: June 28, 1941, 79 y.o.   MRN: [id number]  Chief Complaint  Patient presents with  . Follow-up    SOB with any activity, prod cough with white/clear mucus, some wheezing.    Referring provider: Einar Pheasant, MD  HPI: 79 year old male, former smoker quit in 2017 (116-pack-year history).  Past medical history significant for moderate COPD, interstitial lung disease.  Patient of Dr. Chase Caller, last seen in office on 03/10/2020.  Previous [facility] pulmonary encounter: 03/10/20- Dr. Lance Muss L Londo 79 y.o. -presents for follow-up to discuss his results. His PFT showed gold stage II COPD but there is a decline in the last 5 years. 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 It appears this calcifications are worse in the last 6 years. I personally visualized the film and agree that may be slightly worse. He is denying any trauma in the past or aspiration events or working in the [employer]. I spoke to Dr. Weber Cooks our radiologist again. We presented this in the case conference. Dr. Weber Cooks is puzzled by the calcifications. He is suggesting a biopsy if the patient would handle it. Patient and his wife are not fully sure. I offered the possibility of him going to [facility] for second opinion. He initially did not seem enthusiastic about it but later changed his mind. He did have overnight oxygen study was abnormal. However the 30-day point has elapsed. We apologize. He is going to have this retested. He is using Trelegy's right now and it seems to be okay for him. Of note he continues to have fatigue. He states that he fell down because his blood pressure was low because of medication effect. He bruised his right chest. However he does not want a chest x-ray or go to the emergency room. He says he'll just monitor it clinically at home. He continues to have  low sodium which he says that he has had since he was 79 years old. He also has chronic anemia from intermittent lower gastrointestinal bleeding not otherwise specified. He says he does not want his labs retested. He has a follow-up with a hematologist we'll check that.  06/14/2020 Presents today for 29-month follow-up office visit.  Patient is maintained on Trelegy 100 for stage II COPD.  Feels his breathing has slightly worsened over the last 3 months. Feels it is harder to do things and takes him longer to recover. He has occasional cough which is productive with white mucus. He has intermittent wheezing with exertion. He is compliant with Trelegy. Uses rescue inhaler on average 5-6 times a day but upwards of 12 times a day maximum. He is wearing 2L oxygen at night. He feels depressed d/t inability to do anything. He is planning on starting physical therapy for his back, he would be interested in pulmonary rehab afterwards. Patient had overnight oximetry testing on March 17, 2020, he spent greater than 4 hours and 6 minutes with oxygen saturation less than 88% on room air.  He was ordered for 2 L nasal cannula oxygen at night.  Alpha 1 antitrypsin phenotype has not yet been collected.  Following with hematology for iron deficiency anemia. Needs repeat CT chest in 9 to 12 months (April-July 2022).  In contact with [facility] for potential clinical trial, this is out of network. DME company for oxygen is [employer].   SYMPTOM SCALE - ILD  01/28/2020  03/10/2020  06/14/2020   O2 use RA RA RA  Shortness of Breath 0 -> 5 scale with 5 being worst (score 6 If unable to do)    At rest 5 1 2   Simple tasks - showers, clothes change, eating, shaving 5 1 3   Household (dishes, doing bed, laundry) 5 x 3  Shopping x 2 x  Walking level at own pace 5 2 4   Walking up Stairs 5 4.5 5  Total (30-36) Dyspnea Score 25 10.5 17  How bad is your cough? 2 1.5 2  How bad is your fatigue yes 2.5 5  How bad is nausea 0 0 0   How bad is vomiting?  0 0 0  How bad is diarrhea? 0 0 0  How bad is anxiety? x 1 2  How bad is depression x 1 4     Allergies  Allergen Reactions  . 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 Influenza Split 05/22/2009, 07/17/2011, 06/02/2014  . Influenza, High Dose Seasonal PF 04/19/2016, 04/30/2017, 07/07/2018, 07/14/2019, 05/30/2020  . Influenza,inj,Quad PF,6+ Mos 05/26/2015  . PFIZER SARS-COV-2 Vaccination 08/24/2019, 09/14/2019  . Pneumococcal Conjugate-13 08/22/2017  . Pneumococcal Polysaccharide-23 06/18/2004, 09/09/2012  . Tdap 11/03/2018    Past Medical History:  Diagnosis Date  . Adrenal gland anomaly    enlargement  . Anemia    IDA  . Anxiety   . CAD (coronary artery disease)   . Carpal tunnel syndrome   . Chronic back pain   . Chronic hyponatremia   . Colitis   . Colonic polyp   . COPD (chronic obstructive pulmonary disease) ([place])   . Degenerative disc disease, lumbar   . Depression   . Diverticulosis   . Dyspnea   . Gastric AVM   . GERD (gastroesophageal reflux disease)   . H/O degenerative disc disease   . Hypercholesterolemia   . Hyperkalemia   . Hyperlipidemia   . Hypertension   . Irritable bowel syndrome   . Monoclonal gammopathy   . Monoclonal gammopathy   . Neuropathy   . Personal history of tobacco use, presenting hazards to health 08/17/2015  . Sleep apnea   . Vertebral compression fracture (HCC)     Tobacco History: Social History   Tobacco Use  Smoking Status Former Smoker  . Packs/day: 2.00  . Years: 58.00  . Pack years: 116.00  . Types: Cigarettes  . Start date: 08/19/1957  . Quit date: 11/01/2015  . Years since quitting: 4.6  Smokeless Tobacco Never Used   Counseling given: Not  Answered   Outpatient Medications Prior to Visit  Medication Sig Dispense Refill  . acetaminophen (TYLENOL) 500 MG tablet Take 500-1,000 mg by mouth 3 (three) times daily as needed for moderate pain or headache.    . albuterol (VENTOLIN HFA) 108 (90 Base) MCG/ACT inhaler Inhale 2 puffs into the lungs every 6 (six) hours as needed for wheezing or shortness of breath. 18 g 11  . azelastine (ASTELIN) 0.1 % nasal spray Place 1 spray into both nostrils 2 (two) times daily. Use in each nostril as directed 30 mL 1  . budesonide (ENTOCORT EC) 3 MG 24 hr capsule Take 9 mg by mouth daily as needed (colitis flare).  (Patient not taking: Reported on 06/20/2020)    .  calcium-vitamin D (OSCAL WITH D) 500-200 MG-UNIT tablet Take 2 tablets by mouth daily.    Marland Kitchen ELIQUIS 5 MG TABS tablet Take 5 mg by mouth 2 (two) times daily.     Marland Kitchen FIBER PO Take 1 capsule by mouth daily.     . fluticasone (FLONASE) 50 MCG/ACT nasal spray Place 1 spray into both nostrils daily. 16 g 2  . Fluticasone-Umeclidin-Vilant (TRELEGY ELLIPTA) 100-62.5-25 MCG/INH AEPB Inhale 1 puff into the lungs daily. (Patient not taking: Reported on 06/20/2020) 1 each 11  . folic acid (FOLVITE) 1 MG tablet Take 1 tablet (1 mg total) by mouth daily. Can take any over-the-counter supplement.    . furosemide (LASIX) 20 MG tablet Take 20 mg by mouth 2 (two) times daily.    Marland Kitchen gabapentin (NEURONTIN) 100 MG capsule Take 4 capsules (400 mg) daily in the morning and midday. Take 8 capsules (800 mg) daily in the evening. 480 capsule 0  . metoprolol tartrate (LOPRESSOR) 100 MG tablet Take 1 tablet (100 mg total) by mouth 2 (two) times daily. 60 tablet 0  . pantoprazole (PROTONIX) 40 MG tablet Take 1 tablet (40 mg total) by mouth 2 (two) times daily. 180 tablet 0  . sertraline (ZOLOFT) 50 MG tablet Take 1 tablet (50 mg total) by mouth daily. 30 tablet 0  . triamcinolone ointment (KENALOG) 0.1 %     . vitamin B-12 (CYANOCOBALAMIN) 1000 MCG tablet Take 1,000 mcg by  mouth daily.    . isosorbide mononitrate (IMDUR) 30 MG 24 hr tablet Take 30 mg by mouth daily. (Patient not taking: Reported on 06/20/2020)     No facility-administered medications prior to visit.    Review of Systems  Review of Systems  Constitutional: Positive for fatigue.  Respiratory: Positive for cough, shortness of breath and wheezing.    Physical Exam  BP (!) 150/90 (BP Location: Left Arm, Patient Position: Sitting, Cuff Size: Normal)   Pulse 61   Temp (!) 97.3 F (36.3 C) (Temporal)   Ht 5\' 10"  (1.778 m)   Wt 178 lb 9.6 oz (81 kg)   SpO2 96%   BMI 25.63 kg/m  Physical Exam Constitutional:      Appearance: Normal appearance. He is normal weight.  HENT:     Head: Normocephalic and atraumatic.     Ears:     Comments: Hard of hearing/worse with masks    Mouth/Throat:     Mouth: Mucous membranes are moist.     Pharynx: Oropharynx is clear.  Cardiovascular:     Rate and Rhythm: Normal rate and regular rhythm.  Pulmonary:     Effort: Pulmonary effort is normal.     Breath sounds: Rales present. No wheezing.  Musculoskeletal:     Comments: In transportation wheelchair   Skin:    General: Skin is warm and dry.  Neurological:     General: No focal deficit present.     Mental Status: He is alert and oriented to person, place, and time. Mental status is at baseline.  Psychiatric:        Mood and Affect: Mood normal.        Thought Content: Thought content normal.        Judgment: Judgment normal.      Lab Results:  CBC    Component Value Date/Time   WBC 5.6 06/02/2020 1253   RBC 2.57 (L) 06/02/2020 1253   HGB 9.1 (L) 06/02/2020 1253   HGB 14.5 06/07/2014 0837   HCT 27.2 (L)  06/02/2020 1253   HCT 41.1 06/07/2014 0837   PLT 358 06/02/2020 1253   PLT 410 06/07/2014 0837   MCV 105.8 (H) 06/02/2020 1253   MCV 102 (H) 06/07/2014 0837   MCH 35.4 (H) 06/02/2020 1253   MCHC 33.5 06/02/2020 1253   RDW 14.6 06/02/2020 1253   RDW 13.6 06/07/2014 0837   LYMPHSABS  1.0 06/02/2020 1253   LYMPHSABS 1.8 06/07/2014 0837   MONOABS 0.5 06/02/2020 1253   MONOABS 0.7 06/07/2014 0837   EOSABS 0.0 06/02/2020 1253   EOSABS 0.0 06/07/2014 0837   BASOSABS 0.1 06/02/2020 1253   BASOSABS 0.1 06/07/2014 0837    BMET    Component Value Date/Time   NA 127 (L) 04/11/2020 0556   NA 130 (L) 01/23/2013 0447   K 3.7 04/11/2020 0556   K 4.4 01/23/2013 0447   CL 96 (L) 04/11/2020 0556   CL 100 01/23/2013 0447   CO2 19 (L) 04/11/2020 0556   CO2 23 01/23/2013 0447   GLUCOSE 103 (H) 04/11/2020 0556   GLUCOSE 91 01/23/2013 0447   BUN 13 04/11/2020 0556   BUN 13 01/23/2013 0447   CREATININE 0.97 04/11/2020 0556   CREATININE 1.15 04/22/2014 1115   CALCIUM 8.9 04/11/2020 0556   CALCIUM 8.6 01/23/2013 0447   GFRNONAA >60 04/11/2020 0556   GFRNONAA >60 04/22/2014 1115   GFRAA >60 04/11/2020 0556   GFRAA >60 04/22/2014 1115    BNP    Component Value Date/Time   BNP 426.8 (H) 01/06/2020 1328    ProBNP No results found for: PROBNP  Imaging: No results found.   Assessment & Plan:   Moderate COPD (chronic obstructive pulmonary disease) (HCC) Increased dyspnea over the last 3 months with associated productive cough and wheezing with activity. Using SABA on average 5-6 times a day. CAT score 19 today.  Recommendations: - Stop Trelegy; Start Breztri two puffs twice daily (rinse mouth after use) - Continue to Ventolin rescue inhaler 2 puffs every 4-6 hours for breakthrough shortness of breath or wheezing - Take mucinex 600mg  twice daily for 2 week, if this helps continue taking it daily - After completing PT for back recommend referral to pulmonary rehab   Orders: - Alpha 1 antitrypsin phenotype - Flutter valve   Follow-up: - 2 week televisit with Beth NP or sooner if you develop purulent mucus or worsening sob/wheezing    Nocturnal hypoxia -ONO on March 17, 2020, patient spent > 4 hours and 6 minutes with oxygen saturation less than 88% on room air.   He was ordered for 2L nasal cannula oxygen at night     [name], NP 06/27/2020

## 2020-06-18 ENCOUNTER — Encounter: Payer: Self-pay | Admitting: Internal Medicine

## 2020-06-18 NOTE — Assessment & Plan Note (Signed)
No upper symptoms reported.  On protonix.   

## 2020-06-18 NOTE — Assessment & Plan Note (Signed)
Has seen Dr Sharlet Salina.  Persistent low back pain.  No radiation of symptoms.  Request referral to [facility] PT.

## 2020-06-18 NOTE — Assessment & Plan Note (Signed)
Followed by hematology 

## 2020-06-18 NOTE — Assessment & Plan Note (Signed)
Overall appears to be stable.  Follow.   

## 2020-06-18 NOTE — Assessment & Plan Note (Signed)
S/p cardioversion.  On eliquis.  Stable.  Continue f/u with cardiology.

## 2020-06-18 NOTE — Assessment & Plan Note (Signed)
Stable. On gabapentin.  

## 2020-06-18 NOTE — Assessment & Plan Note (Signed)
Followed by hematology.  Receiving iron infusions.

## 2020-06-18 NOTE — Assessment & Plan Note (Signed)
Followed by pulmonary and cardiology.  

## 2020-06-18 NOTE — Assessment & Plan Note (Signed)
Low cholesterol diet and exercise.  Follow lipid panel.   

## 2020-06-18 NOTE — Assessment & Plan Note (Signed)
Bowels stable.  Followed by GI.   

## 2020-06-18 NOTE — Assessment & Plan Note (Signed)
Using trelegy and rescue inhaler as outlined.  Some days with more sob as outlined.  Has f/u with pulmonary tomorrow.  No acute cough or congestion.

## 2020-06-18 NOTE — Assessment & Plan Note (Signed)
Blood pressure as outlined.  Off isosorbide.  On 1/2 metoprolol.  Blood pressures as outlined.  Feels better.  Follow pressures.  Follow metabolic panel.

## 2020-06-18 NOTE — Assessment & Plan Note (Signed)
Has been worked up extensively.  Sodium stable.  Follow.  Needs to decrease alcohol intake.

## 2020-06-18 NOTE — Assessment & Plan Note (Signed)
Continue risk factor modification.  Followed by cardiology.  

## 2020-06-18 NOTE — Assessment & Plan Note (Signed)
Continue trelegy.  Using rescue inhaler.  F/u as outlined.

## 2020-06-20 ENCOUNTER — Ambulatory Visit: Payer: PPO | Admitting: Pharmacist

## 2020-06-20 ENCOUNTER — Telehealth: Payer: Self-pay | Admitting: Primary Care

## 2020-06-20 DIAGNOSIS — J449 Chronic obstructive pulmonary disease, unspecified: Secondary | ICD-10-CM

## 2020-06-20 DIAGNOSIS — J849 Interstitial pulmonary disease, unspecified: Secondary | ICD-10-CM

## 2020-06-20 DIAGNOSIS — I4892 Unspecified atrial flutter: Secondary | ICD-10-CM

## 2020-06-20 DIAGNOSIS — D472 Monoclonal gammopathy: Secondary | ICD-10-CM

## 2020-06-20 DIAGNOSIS — I2510 Atherosclerotic heart disease of native coronary artery without angina pectoris: Secondary | ICD-10-CM

## 2020-06-20 NOTE — Patient Instructions (Signed)
Troy Lopez,   It was great talking to you today!  Check your blood pressures (alternating between different times of the day) and write down these readings. Take these with you to the cardiologist. I wonder if changing the metoprolol to extended release might provide better coverage throughout the day. I would be interested to hear their opinion.   We'll work on [employer] patient assistance to replace the Trelegy assistance.   Call [employer] to investigate your over the counter benefits.   Catie [name], PharmD [phone]  Visit Information  Goals Addressed              This Visit's Progress     Patient Stated   .  PharmD "I want to stay healthy" (pt-stated)        CARE PLAN ENTRY (see longitudinal plan of care for additional care plan information)  Current Barriers:  . Social, financial, community barriers:  o Reports that he continues to "just feel bad". Fatigue, SOB. Is unsure if change from Trelegy to [facility] provided significant benefit. Notes that the intention was to change to [facility] vs Ellipta d/t decline in inspiratory capacity . Polypharmacy; complex patient with multiple comorbidities including aflutter, CAD/HTN, COPD/interstitial lung disease, MGUS, Waldenstrom macroglobulinemia, iron deficiency anemia; depression, chronic pain . Most recent eGFR: >60 mL/min o MGUS, macroglobulinemia, anemia; Dr. Grayland Ormond. Periodic iron infusions.  o COPD: Pulmology Geraldo Pitter. Trial of Breztri instead of Trelegy. Patient is unsure if he has benefit. Discussed that brand albuterol is no longer available from any patient assistance programs, so generic albuterol prescriptions will need to come from local pharmacies. She also recommended use of mucinex BID. Patient notes he hasn't started this yet.  o CAD/HTN/A flutter: Eliquis 5 mg BID; struggling w/ hypotension recently, so self-decreased metoprolol to 50 mg BID, self d/c isosorbide. Has not been keeping track of BP  recently. Feels BP is generally well controlled in the mornings, but runs higher in the afternoons.  - APPROVED for Eliquis assistance through 08/18/20 o Depression: sertraline 50 mg daily o GERD: pantoprazole 40 mg BID o Chronic pain: Gabapentin 400 mg QAM, 400 mg midday, 800 mg QPM; reports he sometimes doesn't need the AM gabapentin dose  Pharmacist Clinical Goal(s):  Marland Kitchen Over the next 90 days, patient will work with PharmD and provider towards optimized medication management  Interventions: . Comprehensive medication review performed; medication list updated in electronic medical record . Inter-disciplinary care team collaboration (see longitudinal plan of care) . Reviewed patient assistance program changes for 2022. Patient will not qualify for Trelegy or Eliquis until he meets out of pocket spend requirement. However, Judithann Sauger does not have an out of pocket spend requirement. Patient amenable to applying for [facility] assistance to ease financial burden. Will collaborate w/ CPhT to mail application to patient and fax provider portion to Geraldo Pitter at [facility].  . Discussed that generic albuterol + GoodRx card at [facility] would be most cost effective option for rescue inhaler. He notes he still has a supply of albuterol at home from previous patient assistance applications, but will let us know when he needs a refill in the future. . Reviewed to keep BP log at home and document time of day. Encouraged continued cardiology f/u. Discussed principals of cardioselective vs non cardioselective and pharmacokinetic duration of action of beta blockers. Encouraged to discuss suggestions for improved BP control w/ cardiology.   Patient Self Care Activities:  . Patient will take medications as prescribed . Patient will  collaborate w/ medical team on patient assistance applications  Please see past updates related to this goal by clicking on the "Past Updates" button in the selected  goal         The patient verbalized understanding of instructions provided today and agreed to receive a mailed copy of patient instruction and/or educational materials.  Plan:  - Scheduled f/u call in ~ 5 weeks  Catie [name], PharmD, [place], [place] Pharmacist [place] [phone]

## 2020-06-20 NOTE — Chronic Care Management (AMB) (Signed)
Chronic Care Management   Follow Up Note   06/20/2020 Name: Troy Lopez MRN: [id number] DOB: 1941/03/19  Referred by: Einar Pheasant, MD Reason for referral : Chronic Care Management (Medication Management)   Troy Lopez is a 79 y.o. year old male who is a primary care patient of Einar Pheasant, MD. The CCM team was consulted for assistance with chronic disease management and care coordination needs.    Contacted patient for medication management review.  Review of patient status, including review of consultants reports, relevant laboratory and other test results, and collaboration with appropriate care team members and the patient's provider was performed as part of comprehensive patient evaluation and provision of chronic care management services.    SDOH (Social Determinants of Health) assessments performed: Yes See Care Plan activities for detailed interventions related to SDOH)  SDOH Interventions     Most Recent Value  SDOH Interventions  Financial Strain Interventions Other (Comment)  [manufacturer assisatnce]       Outpatient Encounter Medications as of 06/20/2020  Medication Sig Note   acetaminophen (TYLENOL) 500 MG tablet Take 500-1,000 mg by mouth 3 (three) times daily as needed for moderate pain or headache. 02/22/2019: 2 tablets 3 times daily    albuterol (VENTOLIN HFA) 108 (90 Base) MCG/ACT inhaler Inhale 2 puffs into the lungs every 6 (six) hours as needed for wheezing or shortness of breath.    azelastine (ASTELIN) 0.1 % nasal spray Place 1 spray into both nostrils 2 (two) times daily. Use in each nostril as directed    calcium-vitamin D (OSCAL WITH D) 500-200 MG-UNIT tablet Take 2 tablets by mouth daily.    ELIQUIS 5 MG TABS tablet Take 5 mg by mouth 2 (two) times daily.     FIBER PO Take 1 capsule by mouth daily.     fluticasone (FLONASE) 50 MCG/ACT nasal spray Place 1 spray into both nostrils daily.    folic acid (FOLVITE) 1 MG tablet Take 1 tablet  (1 mg total) by mouth daily. Can take any over-the-counter supplement.    furosemide (LASIX) 20 MG tablet Take 20 mg by mouth 2 (two) times daily. 05/03/2020: Taking 40 mg QAM   gabapentin (NEURONTIN) 100 MG capsule Take 4 capsules (400 mg) daily in the morning and midday. Take 8 capsules (800 mg) daily in the evening.    metoprolol tartrate (LOPRESSOR) 100 MG tablet Take 1 tablet (100 mg total) by mouth 2 (two) times daily. 06/20/2020: Taking 50 mg BID   pantoprazole (PROTONIX) 40 MG tablet Take 1 tablet (40 mg total) by mouth 2 (two) times daily.    sertraline (ZOLOFT) 50 MG tablet Take 1 tablet (50 mg total) by mouth daily.    triamcinolone ointment (KENALOG) 0.1 %     vitamin B-12 (CYANOCOBALAMIN) 1000 MCG tablet Take 1,000 mcg by mouth daily.    budesonide (ENTOCORT EC) 3 MG 24 hr capsule Take 9 mg by mouth daily as needed (colitis flare).  (Patient not taking: Reported on 06/20/2020)    Fluticasone-Umeclidin-Vilant (TRELEGY ELLIPTA) 100-62.5-25 MCG/INH AEPB Inhale 1 puff into the lungs daily. (Patient not taking: Reported on 06/20/2020)    [DISCONTINUED] isosorbide mononitrate (IMDUR) 30 MG 24 hr tablet Take 30 mg by mouth daily. (Patient not taking: Reported on 06/20/2020)    No facility-administered encounter medications on file as of 06/20/2020.     Objective:   Goals Addressed              This Visit's Progress  Patient Stated     PharmD "I want to stay healthy" (pt-stated)        CARE PLAN ENTRY (see longitudinal plan of care for additional care plan information)  Current Barriers:   Social, financial, community barriers:  o Reports that he continues to "just feel bad". Fatigue, SOB. Is unsure if change from Trelegy to [facility] provided significant benefit. Notes that the intention was to change to [facility] vs [facility] d/t decline in inspiratory capacity  Polypharmacy; complex patient with multiple comorbidities including aflutter, CAD/HTN, COPD/interstitial lung  disease, MGUS, [place] macroglobulinemia, iron deficiency anemia; depression, chronic pain  Most recent eGFR: >60 mL/min o MGUS, macroglobulinemia, anemia; Dr. Grayland Ormond. Periodic iron infusions.  o COPD: Pulmology Geraldo Pitter. Trial of Breztri instead of Trelegy. Patient is unsure if he has benefit. Discussed that brand albuterol is no longer available from any patient assistance programs, so generic albuterol prescriptions will need to come from local pharmacies. She also recommended use of mucinex BID. Patient notes he hasn't started this yet.  o CAD/HTN/A flutter: Eliquis 5 mg BID; struggling w/ hypotension recently, so self-decreased metoprolol to 50 mg BID, self d/c isosorbide. Has not been keeping track of BP recently. Feels BP is generally well controlled in the mornings, but runs higher in the afternoons.  - APPROVED for Eliquis assistance through 08/18/20 o Depression: sertraline 50 mg daily o GERD: pantoprazole 40 mg BID o Chronic pain: Gabapentin 400 mg QAM, 400 mg midday, 800 mg QPM; reports he sometimes doesn't need the AM gabapentin dose  Pharmacist Clinical Goal(s):   Over the next 90 days, patient will work with PharmD and provider towards optimized medication management  Interventions:  Comprehensive medication review performed; medication list updated in electronic medical record  Inter-disciplinary care team collaboration (see longitudinal plan of care)  Reviewed patient assistance program changes for 2022. Patient will not qualify for Trelegy or Eliquis until he meets out of pocket spend requirement. However, Judithann Sauger does not have an out of pocket spend requirement. Patient amenable to applying for [facility] assistance to ease financial burden. Will collaborate w/ CPhT to mail application to patient and fax provider portion to Geraldo Pitter at [facility].   Discussed that generic albuterol + GoodRx card at [facility] would be most cost effective  option for rescue inhaler. He notes he still has a supply of albuterol at home from previous patient assistance applications, but will let us know when he needs a refill in the future.  Reviewed to keep BP log at home and document time of day. Encouraged continued cardiology f/u. Discussed principals of cardioselective vs non cardioselective and pharmacokinetic duration of action of beta blockers. Encouraged to discuss suggestions for improved BP control w/ cardiology.   Patient Self Care Activities:   Patient will take medications as prescribed  Patient will collaborate w/ medical team on patient assistance applications  Please see past updates related to this goal by clicking on the "Past Updates" button in the selected goal          Plan:  - Scheduled f/u call in ~ 5 weeks  Catie [name], PharmD, [place], [place] Pharmacist [place] [phone]

## 2020-06-20 NOTE — Telephone Encounter (Signed)
Ok. I should be having a televisit with him in 2 week, we can send in prescription then after I see how he is doing

## 2020-06-20 NOTE — Telephone Encounter (Signed)
Tried to initiate a PA for [employer]. Received a response stating that the medication should be available for pt to get without having a PA performed.

## 2020-06-21 NOTE — Telephone Encounter (Signed)
Checked pt's appt schedule and there was not one scheduled for a follow up after Breztri trial.  Called and spoke with pt and have scheduled a televisit for pt. Nothing further needed.

## 2020-06-26 DIAGNOSIS — Z86018 Personal history of other benign neoplasm: Secondary | ICD-10-CM | POA: Diagnosis not present

## 2020-06-26 DIAGNOSIS — Z85828 Personal history of other malignant neoplasm of skin: Secondary | ICD-10-CM | POA: Diagnosis not present

## 2020-06-26 DIAGNOSIS — Z872 Personal history of diseases of the skin and subcutaneous tissue: Secondary | ICD-10-CM | POA: Diagnosis not present

## 2020-06-26 DIAGNOSIS — L578 Other skin changes due to chronic exposure to nonionizing radiation: Secondary | ICD-10-CM | POA: Diagnosis not present

## 2020-06-27 DIAGNOSIS — G4734 Idiopathic sleep related nonobstructive alveolar hypoventilation: Secondary | ICD-10-CM | POA: Insufficient documentation

## 2020-06-27 NOTE — Progress Notes (Signed)
Virtual Visit via Telephone Note  I connected with [place] on 06/27/20 at 12:00 PM EST by telephone and verified that I am speaking with the correct person using two identifiers.  Location: Patient: Home Provider: Office   I discussed the limitations, risks, security and privacy concerns of performing an evaluation and management service by telephone and the availability of in person appointments. I also discussed with the patient that there may be a patient responsible charge related to this service. The patient expressed understanding and agreed to proceed.   History of Present Illness: 79 year old male, former smoker quit in 2017 (116-pack-year history).  Past medical history significant for moderate COPD, interstitial lung disease.  Patient of Dr. Chase Caller.  Previous [facility] pulmonary encounter: 03/10/20- Dr. Lance Muss L Iles 79 y.o. -presents for follow-up to discuss his results. His PFT showed gold stage II COPD but there is a decline in the last 5 years. 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 It appears this calcifications are worse in the last 6 years. I personally visualized the film and agree that may be slightly worse. He is denying any trauma in the past or aspiration events or working in the [employer]. I spoke to Dr. Weber Cooks our radiologist again. We presented this in the case conference. Dr. Weber Cooks is puzzled by the calcifications. He is suggesting a biopsy if the patient would handle it. Patient and his wife are not fully sure. I offered the possibility of him going to [facility] for second opinion. He initially did not seem enthusiastic about it but later changed his mind. He did have overnight oxygen study was abnormal. However the 30-day point has elapsed. We apologize. He is going to have this retested. He is using Trelegy's right now and it seems to be  okay for him. Of note he continues to have fatigue. He states that he fell down because his blood pressure was low because of medication effect. He bruised his right chest. However he does not want a chest x-ray or go to the emergency room. He says he'll just monitor it clinically at home. He continues to have low sodium which he says that he has had since he was 79 years old. He also has chronic anemia from intermittent lower gastrointestinal bleeding not otherwise specified. He says he does not want his labs retested. He has a follow-up with a hematologist we'll check that.  06/14/2020 Presents today for 44-month follow-up office visit.  Patient is maintained on Trelegy 100 for stage II COPD.  Patient had overnight oximetry testing on March 17, 2020, he spent greater than then 4 hours and 6 minutes with oxygen saturation less than 88% on room air.  He was ordered for 2 L nasal cannula oxygen at night.  Alpha 1 antitrypsin phenotype has not yet been collected.  Following with hematology for iron deficiency anemia. Needs repeat CT chest in 9 to 12 months (April-July 2022).  In contact with [facility] for potential clinical trial, this is out of network.  Feels his breathing has slightly worsened over the last 3 months. Feels it is harder to do things and takes him longer to recover. He has occasional cough which is productive with white mucus. He has intermittent wheezing with exertion. He is compliant with Trelegy. Uses rescue inhaler on average 5-6 times a day but upwards of 12 times a day maximum. He is wearing  2L oxygen at night. He feels depressed d/t inability to do anything. He is planning on starting physical therapy for his back, he would be interested in pulmonary rehab afterwards. DME company for oxygen is [employer]. CAT score 19 today.   06/28/2020- interim hx Patient contacted today for 2-3 week follow-up after starting Breztri. He has not noticed a difference one way or the other in regards  to his breathing since stating Brezti. Over the last two days and has needed to use his Albuterol 3-4 times. He applied for patient assistance for [employer]. He wants a smaller oxygen concentrator at night to travel with.  States that he received a letter from [place] approving him for [place]- authorization code [id number]. His oxygen company is [employer]. CAT score today 17.   Observations/Objective:  - Able to speak in full sentences; no overt shortness of breath or wheezing   Assessment and Plan:  COPD - Clinically stable, breathing remains baseline. CAT score and SABA use are improved on Breztri. Plan continue Breztri two puffs twice daily, patient has applied for patient assistance. OK to continue Trelegy until approved. Consider pulmonary rehab once patient has completed physical therapy for his back. Alpha-1 is still pending, this was ordered last two visits. FU in 3 months with Dr. Chase Caller  Nocturnal hypoxia: - Patient wears 2L continuous oxygen at night and prn during the day  - Referral to [employer] for best fit, hold off on sending in order for POC   Follow Up Instructions:   - 3 months with Dr. Chase Caller  I discussed the assessment and treatment plan with the patient. The patient was provided an opportunity to ask questions and all were answered. The patient agreed with the plan and demonstrated an understanding of the instructions.   The patient was advised to call back or seek an in-person evaluation if the symptoms worsen or if the condition fails to improve as anticipated.  I provided 22 minutes of non-face-to-face time during this encounter.   [name], NP

## 2020-06-27 NOTE — Assessment & Plan Note (Addendum)
Increased dyspnea over the last 3 months with associated productive cough and wheezing with activity. Using SABA on average 5-6 times a day. CAT score 19 today.  Recommendations: - Stop Trelegy; Start Breztri two puffs twice daily (rinse mouth after use) - Continue to Ventolin rescue inhaler 2 puffs every 4-6 hours for breakthrough shortness of breath or wheezing - Take mucinex 600mg  twice daily for 2 week, if this helps continue taking it daily - After completing PT for back recommend referral to pulmonary rehab   Orders: - Alpha 1 antitrypsin phenotype - Flutter valve   Follow-up: - 2 week televisit with Beth NP or sooner if you develop purulent mucus or worsening sob/wheezing

## 2020-06-27 NOTE — Assessment & Plan Note (Signed)
-  ONO on March 17, 2020, patient spent > 4 hours and 6 minutes with oxygen saturation less than 88% on room air.  He was ordered for 2L nasal cannula oxygen at night

## 2020-06-28 ENCOUNTER — Ambulatory Visit (INDEPENDENT_AMBULATORY_CARE_PROVIDER_SITE_OTHER): Payer: PPO | Admitting: Primary Care

## 2020-06-28 ENCOUNTER — Telehealth: Payer: Self-pay | Admitting: Primary Care

## 2020-06-28 ENCOUNTER — Other Ambulatory Visit: Payer: Self-pay

## 2020-06-28 ENCOUNTER — Encounter: Payer: Self-pay | Admitting: Primary Care

## 2020-06-28 DIAGNOSIS — G4734 Idiopathic sleep related nonobstructive alveolar hypoventilation: Secondary | ICD-10-CM

## 2020-06-28 DIAGNOSIS — J449 Chronic obstructive pulmonary disease, unspecified: Secondary | ICD-10-CM

## 2020-06-28 NOTE — Patient Instructions (Signed)
COPD: - Plan continue Breztri two puffs twice daily. OK to continue Trelegy until [place] assistance has been approved. Consider pulmonary rehab once completed physical therapy for his back.   Nocturnal hypoxia: - Continue to wear 2L continuous oxygen at night  - Referral to [employer] for best fit, hold off on sending in order for POC (unsure if this will be able to be used at night, usually just for travel and may not provide you the oxygen requirements that you need)  Follow-up - 3 months with Dr. Alveria Apley

## 2020-06-28 NOTE — Telephone Encounter (Signed)
Order has been placed. Nothing further needed. 

## 2020-06-28 NOTE — Telephone Encounter (Signed)
Raquel Sarna can you put an order in for patient to have best fit with [employer] for oxygen. He is looking to get a smaller oxygen concentrator at night to travel with. He is on 2L oxygen continuously at night and prn during the day.   He was approved for POC with [employer]/ authorization code [id number] but I am not sure this would be ideal for him to use at night depending on what POC it is. We are holding off on ordering this right now until he speaks with [employer]

## 2020-06-30 ENCOUNTER — Encounter: Payer: Self-pay | Admitting: Nurse Practitioner

## 2020-06-30 ENCOUNTER — Other Ambulatory Visit: Payer: Self-pay | Admitting: Internal Medicine

## 2020-06-30 ENCOUNTER — Ambulatory Visit (INDEPENDENT_AMBULATORY_CARE_PROVIDER_SITE_OTHER): Payer: PPO | Admitting: Nurse Practitioner

## 2020-06-30 ENCOUNTER — Other Ambulatory Visit: Payer: Self-pay

## 2020-06-30 VITALS — BP 144/70 | HR 66 | Temp 98.2°F | Ht 70.0 in | Wt 178.0 lb

## 2020-06-30 DIAGNOSIS — T148XXA Other injury of unspecified body region, initial encounter: Secondary | ICD-10-CM

## 2020-06-30 DIAGNOSIS — S51812A Laceration without foreign body of left forearm, initial encounter: Secondary | ICD-10-CM

## 2020-06-30 MED ORDER — MUPIROCIN 2 % EX OINT: TOPICAL_OINTMENT | CUTANEOUS | 1 refills | Status: AC

## 2020-06-30 MED ORDER — MUPIROCIN 2 % EX OINT: TOPICAL_OINTMENT | CUTANEOUS | 1 refills | Status: DC

## 2020-06-30 NOTE — Patient Instructions (Addendum)
Sit at edge of chair for a minute before standing. Do not jump up from sitting to standing.   Wound dressing as instructed once a day and apply the Bactroban daily for 2 days and then stop it.   Telfa, gauze wrap and cover with the Coban. Not too tight.   Monitor for signs of infection fever/chills: redness, swelling, drainage, painful, or fever/chills- go to the [facility]  CALL THE OFFICE ON MONDAY  FOR UPDATE- LET us KNOW HOW IT IS LOOKING.  Contact a health care provider if:  You received a tetanus shot and you have swelling, severe pain, redness, or bleeding at the injection site.  You have a fever.  A wound that was closed breaks open.  You notice a bad smell coming from your wound or your dressing.  You notice something coming out of the wound, such as wood or glass.  Your pain is not controlled with medicine.  You have increased redness, swelling, or pain at the site of your wound.  You have fluid, blood, or pus coming from your wound.  You need to change the dressing often due to fluid, blood, or pus that is draining from the wound.  You develop a new rash.  You develop numbness around the wound. Get help right away if:  You develop severe swelling around the wound.  Your pain suddenly increases and is severe.  You develop painful lumps near the wound or on skin anywhere else on your body.  You have a red streak going away from your wound.  The wound is on your hand or foot and you cannot properly move a finger or toe.  The wound is on your hand or foot, and you notice that your fingers or toes look pale or bluish.      Laceration Care, Adult A laceration is a cut that may go through all layers of the skin and into the tissue that is right under the skin. Some lacerations heal on their own. Others need to be closed with stitches (sutures), staples, skin adhesive strips, or skin glue. Proper care of a laceration reduces the risk for infection, helps  the laceration heal better, and may prevent scarring. How to care for your laceration Wash your hands with soap and water before touching your wound or changing your bandage (dressing). If soap and water are not available, use hand sanitizer. Keep the wound clean and dry. If you were given a dressing, you should change it at least once a day, or as told by your health care provider. You should also change it if it becomes wet or dirty. If sutures or staples were used:  Keep the wound completely dry for the first 24 hours, or as told by your health care provider. After that time, you may shower or bathe. However, make sure that the wound is not soaked in water until after the sutures or staples have been removed.  Clean the wound once each day, or as told by your health care provider: ? Wash the wound with soap and water. ? Rinse the wound with water to remove all soap. ? Pat the wound dry with a clean towel. Do not rub the wound.  After cleaning the wound, apply a thin layer of antibiotic ointment as told by your health care provider. This will help prevent infection and keep the dressing from sticking to the wound.  Have the sutures or staples removed as told by your health care provider. If  skin adhesive strips were used:  Do not get the skin adhesive strips wet. You may shower or bathe, but be careful to keep the wound dry.  If the wound gets wet, pat it dry with a clean towel. Do not rub the wound.  Skin adhesive strips fall off on their own. You may trim the strips as the wound heals. Do not remove skin adhesive strips that are still stuck to the wound. They will fall off in time. If skin glue was used:  Try to keep the wound dry, but you may briefly wet it in the shower or bath. Do not soak the wound in water, such as by swimming.  After you have showered or bathed, gently pat the wound dry with a clean towel. Do not rub the wound.  Do not do any activities that will make you  sweat heavily until the skin glue has fallen off on its own.  Do not apply liquid, cream, or ointment medicine to the wound while the skin glue is in place. Using those may loosen the film before the wound has healed.  If a dressing is placed over the wound, be careful not to apply tape directly over the skin glue. Doing that may cause the glue to be pulled off before the wound has healed.  Do not pick at the glue. Skin glue usually remains in place for 5-10 days and then falls off the skin. General instructions   Take over-the-counter and prescription medicines only as told by your health care provider.  If you were prescribed an antibiotic medicine or ointment, take or apply it as told by your health care provider. Do not stop using it even if your condition improves.  Do not scratch or pick at the wound.  Check your wound every day for signs of infection. Watch for: ? Redness, swelling, or pain. ? Fluid, blood, or pus.  Raise (elevate) the injured area above the level of your heart while you are sitting or lying down for the first 24-48 hours after the laceration is repaired.  If directed, put ice on the affected area: ? Put ice in a plastic bag. ? Place a towel between your skin and the bag. ? Leave the ice on for 20 minutes, 2-3 times a day.  Keep all follow-up visits as told by your health care provider. This is important. Contact a health care provider if:  You received a tetanus shot and you have swelling, severe pain, redness, or bleeding at the injection site.  You have a fever.  A wound that was closed breaks open.  You notice a bad smell coming from your wound or your dressing.  You notice something coming out of the wound, such as wood or glass.  Your pain is not controlled with medicine.  You have increased redness, swelling, or pain at the site of your wound.  You have fluid, blood, or pus coming from your wound.  You need to change the dressing often due  to fluid, blood, or pus that is draining from the wound.  You develop a new rash.  You develop numbness around the wound. Get help right away if:  You develop severe swelling around the wound.  Your pain suddenly increases and is severe.  You develop painful lumps near the wound or on skin anywhere else on your body.  You have a red streak going away from your wound.  The wound is on your hand or foot and you cannot  properly move a finger or toe.  The wound is on your hand or foot, and you notice that your fingers or toes look pale or bluish. Summary  A laceration is a cut that may go through all layers of the skin and into the tissue that is right under the skin.  Some lacerations heal on their own. Others need to be closed with stitches (sutures), staples, skin adhesive strips, or skin glue.  Proper care of a laceration reduces the risk of infection, helps the laceration heal better, and prevents scarring. This information is not intended to replace advice given to you by your health care provider. Make sure you discuss any questions you have with your health care provider. Document Revised: 10/03/2017 Document Reviewed: 08/25/2017 Elsevier Patient Education  [place].

## 2020-06-30 NOTE — Progress Notes (Signed)
Established Patient Office Visit  Subjective:  Patient ID: Troy Lopez, male    DOB: 12-17-1940  Age: 79 y.o. MRN: [id number]  CC:  Chief Complaint  Patient presents with  . Acute Visit    wound on arm    HPI Troy Lopez is a 79 yo who presents after a fall on Wed night onto the carpet and " I  Tore my arm all to pieces. "  He passed out and has a hx of syncope d/t hypotension. He has done this several times in the past- most recently 6 months ago. He has had adjustments in his BP medication and is followed by Dr. Saralyn Pilar.   At midnight to 0100, he took 50 mg metoprolol  and 30 min later got out of his recliner chair and turned to the left and passed out. He hurt left arm- and sore all over. His arm likely struck the corner of the end table. He did not hit his head. He had abrasion and had bled from arm.  He bandaged himself up and went to bed. His wife re- dressed and cleaned with peroxide and used non stick pad with antibacterial cream and wrapped in coban.  Left forearm did hurt on Saturday at lunchtime .Today, it is feeling fine. He feels less achy - right upper arm mildly sore, legs sore- no pain. He did not break anything. He is using his left arm and hand normally.  No fever/chills. No dizziness, lightheadedness, CP and no further pre syncope or syncope.   Past Medical History:  Diagnosis Date  . Adrenal gland anomaly    enlargement  . Anemia    IDA  . Anxiety   . CAD (coronary artery disease)   . Carpal tunnel syndrome   . Chronic back pain   . Chronic hyponatremia   . Colitis   . Colonic polyp   . COPD (chronic obstructive pulmonary disease) ([place])   . Degenerative disc disease, lumbar   . Depression   . Diverticulosis   . Dyspnea   . Gastric AVM   . GERD (gastroesophageal reflux disease)   . H/O degenerative disc disease   . Hypercholesterolemia   . Hyperkalemia   . Hyperlipidemia   . Hypertension   . Irritable bowel syndrome   . Monoclonal gammopathy     . Monoclonal gammopathy   . Neuropathy   . Personal history of tobacco use, presenting hazards to health 08/17/2015  . Sleep apnea   . Vertebral compression fracture [facility])     Past Surgical History:  Procedure Laterality Date  . 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/09/2020   Procedure: AMPUTATION TOE;  Surgeon: Sharlotte Alamo, DPM;  Location: [facility] ORS;  Service: Podiatry;  Laterality: Right;  . BUNIONECTOMY  1989  . CARDIAC CATHETERIZATION    . CARPAL TUNNEL RELEASE Left 2011   ulnar nerve sub muscular at elbow  . CHOLECYSTECTOMY  09/07  . COLONOSCOPY WITH PROPOFOL N/A 03/05/2017   Procedure: COLONOSCOPY WITH PROPOFOL;  Surgeon: Lucilla Lame, MD;  Location: [facility] ENDOSCOPY;  Service: Endoscopy;  Laterality: N/A;  . ELECTROPHYSIOLOGIC STUDY N/A 11/15/2015   Procedure: CARDIOVERSION;  Surgeon: Isaias Cowman, MD;  Location: [facility] ORS;  Service: Cardiovascular;  Laterality: N/A;  . ESOPHAGOGASTRODUODENOSCOPY N/A 04/10/2020   Procedure: ESOPHAGOGASTRODUODENOSCOPY (EGD);  Surgeon: Toledo, Benay Pike, MD;  Location: [facility] ENDOSCOPY;  Service: Gastroenterology;  Laterality: N/A;  . ESOPHAGOGASTRODUODENOSCOPY (EGD) WITH PROPOFOL N/A 03/05/2017   Procedure: ESOPHAGOGASTRODUODENOSCOPY (EGD) WITH PROPOFOL;  Surgeon: Lucilla Lame, MD;  Location: [facility] ENDOSCOPY;  Service: Endoscopy;  Laterality: N/A;  . ESOPHAGOGASTRODUODENOSCOPY (EGD) WITH PROPOFOL N/A 10/17/2017   Procedure: ESOPHAGOGASTRODUODENOSCOPY (EGD) WITH PROPOFOL;  Surgeon: Lollie Sails, MD;  Location: [facility] ENDOSCOPY;  Service: Endoscopy;  Laterality: N/A;  . EYE SURGERY Bilateral 2010   cataract  . HEMORRHOID SURGERY    . KYPHOPLASTY N/A 11/04/2016   Procedure: KYPHOPLASTY T 12;  Surgeon: Hessie Knows, MD;  Location: [facility] ORS;  Service: Orthopedics;  Laterality: N/A;    Family History  Problem Relation Age of Onset  . Stroke Mother   . Heart disease Father        MI - 58   . Colon cancer Neg Hx   . Prostate cancer Neg Hx     Social History    Socioeconomic History  . Marital status: Married    Spouse name: Not on file  . Number of children: 3  . Years of education: Not on file  . Highest education level: Not on file  Occupational History  . Not on file  Tobacco Use  . Smoking status: Former Smoker    Packs/day: 2.00    Years: 58.00    Pack years: 116.00    Types: Cigarettes    Start date: 08/19/1957    Quit date: 11/01/2015    Years since quitting: 4.6  . Smokeless tobacco: Never Used  Vaping Use  . Vaping Use: Never used  Substance and Sexual Activity  . Alcohol use: Yes    Alcohol/week: 42.0 standard drinks    Types: 42 Cans of beer per week    Comment: occas  . Drug use: No  . Sexual activity: Not on file  Other Topics Concern  . Not on file  Social History Narrative  . Not on file   Social Determinants of Health   Financial Resource Strain: Medium Risk  . Difficulty of Paying Living Expenses: Somewhat hard  Food Insecurity:   . Worried About Charity fundraiser in the Last Year: Not on file  . Ran Out of Food in the Last Year: Not on file  Transportation Needs:   . Lack of Transportation (Medical): Not on file  . Lack of Transportation (Non-Medical): Not on file  Physical Activity:   . Days of Exercise per Week: Not on file  . Minutes of Exercise per Session: Not on file  Stress: Stress Concern Present  . Feeling of Stress : Rather much  Social Connections:   . Frequency of Communication with Friends and Family: Not on file  . Frequency of Social Gatherings with Friends and Family: Not on file  . Attends Religious Services: Not on file  . Active Member of Clubs or Organizations: Not on file  . Attends Archivist Meetings: Not on file  . Marital Status: Not on file  Intimate Partner Violence:   . Fear of Current or Ex-Partner: Not on file  . Emotionally Abused: Not on file  . Physically Abused: Not on file  . Sexually Abused: Not on file    Outpatient Medications Prior to Visit   Medication Sig Dispense Refill  . acetaminophen (TYLENOL) 500 MG tablet Take 500-1,000 mg by mouth 3 (three) times daily as needed for moderate pain or headache.    . albuterol (VENTOLIN HFA) 108 (90 Base) MCG/ACT inhaler Inhale 2 puffs into the lungs every 6 (six) hours as needed for wheezing or shortness of breath. 18 g 11  . azelastine (ASTELIN) 0.1 % nasal spray Place 1  spray into both nostrils 2 (two) times daily. Use in each nostril as directed 30 mL 1  . budesonide (ENTOCORT EC) 3 MG 24 hr capsule Take 9 mg by mouth daily as needed (colitis flare).     . calcium-vitamin D (OSCAL WITH D) 500-200 MG-UNIT tablet Take 2 tablets by mouth daily.    Marland Kitchen ELIQUIS 5 MG TABS tablet Take 5 mg by mouth 2 (two) times daily.     Marland Kitchen FIBER PO Take 1 capsule by mouth daily.     . fluticasone (FLONASE) 50 MCG/ACT nasal spray Place 1 spray into both nostrils daily. 16 g 2  . Fluticasone-Umeclidin-Vilant (TRELEGY ELLIPTA) 100-62.5-25 MCG/INH AEPB Inhale 1 puff into the lungs daily. 1 each 11  . folic acid (FOLVITE) 1 MG tablet Take 1 tablet (1 mg total) by mouth daily. Can take any over-the-counter supplement.    . furosemide (LASIX) 20 MG tablet Take 20 mg by mouth 2 (two) times daily.    Marland Kitchen gabapentin (NEURONTIN) 100 MG capsule Take 4 capsules (400 mg) daily in the morning and midday. Take 8 capsules (800 mg) daily in the evening. 480 capsule 0  . metoprolol tartrate (LOPRESSOR) 100 MG tablet Take 1 tablet (100 mg total) by mouth 2 (two) times daily. 60 tablet 0  . pantoprazole (PROTONIX) 40 MG tablet Take 1 tablet (40 mg total) by mouth 2 (two) times daily. 180 tablet 0  . sertraline (ZOLOFT) 50 MG tablet Take 1 tablet (50 mg total) by mouth daily. 30 tablet 0  . triamcinolone ointment (KENALOG) 0.1 %     . vitamin B-12 (CYANOCOBALAMIN) 1000 MCG tablet Take 1,000 mcg by mouth daily.    . Budeson-Glycopyrrol-Formoterol (BREZTRI AEROSPHERE) 160-9-4.8 MCG/ACT AERO Inhale 2 puffs into the lungs in the morning and  at bedtime.     No facility-administered medications prior to visit.    Allergies  Allergen Reactions  . 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   Objective:    Physical Exam Vitals reviewed.  Constitutional:      Appearance: Normal appearance. He is normal weight.  HENT:     Head: Normocephalic and atraumatic.  Cardiovascular:     Rate and Rhythm: Normal rate and regular rhythm.     Pulses: Normal pulses.     Heart sounds: Normal heart sounds.  Pulmonary:     Effort: Pulmonary effort is normal.     Breath sounds: Wheezing present.     Comments: Slight wheezing with hx of COPD Musculoskeletal:        General: Signs of injury present. No swelling or deformity. Normal range of motion.       Arms:     Cervical back: Normal range of motion and neck supple.     Right lower leg: No edema.     Left lower leg: No edema.     Comments: Left upper arm with large area of skin tear (rug burn) with areas of epidermis rolled skin  and patchy areas of intact skin and rolled skin.There is discoloration of the rolled skin and will need debrided.   Left lower forearm with a jagged laceration with a deeper area (suspect struck  table edge ) and rolled skin. There is discoloration of the rolled skin and  will need debrided. A Telfa dressing was attached and sterile NSS was poured over dressing to release. Slight bleeding - resolved with pressure.   Both upper arm and forearm sites show no swelling, erythema, warmth, purulent drainage or signs of infection.   He has normal ROM to arm and hand and no bony pain or tenderness.   Skin:    General: Skin is warm and dry.  Neurological:     General: No focal deficit present.     Mental  Status: He is alert and oriented to person, place, and time.  Psychiatric:        Mood and Affect: Mood normal.        Behavior: Behavior normal.     BP (!) 144/70 (BP Location: Left Arm, Patient Position: Sitting, Cuff Size: Normal)   Pulse 66   Temp 98.2 F (36.8 C) (Oral)   Ht 5\' 10"  (1.778 m)   Wt 178 lb (80.7 kg)   SpO2 97%   BMI 25.54 kg/m  Wt Readings from Last 3 Encounters:  06/30/20 178 lb (80.7 kg)  06/14/20 178 lb 9.6 oz (81 kg)  06/13/20 177 lb 12.8 oz (80.6 kg)   Pulse Readings from Last 3 Encounters:  06/30/20 66  06/14/20 61  06/13/20 (!) 59    BP Readings from Last 3 Encounters:  06/30/20 (!) 144/70  06/14/20 (!) 150/90  06/13/20 (!) 146/82    Lab Results  Component Value Date   CHOL 145 01/24/2020   HDL 68.30 01/24/2020   LDLCALC 65 01/24/2020   TRIG 56.0 01/24/2020   CHOLHDL 2 01/24/2020      There are no preventive care reminders to display for this patient.  There are no preventive care reminders to display for this patient.  Lab Results  Component Value Date   TSH 1.98 01/24/2020   Lab Results  Component Value Date   WBC 5.6 06/02/2020   HGB 9.1 (L) 06/02/2020   HCT 27.2 (L) 06/02/2020   MCV 105.8 (H) 06/02/2020   PLT 358 06/02/2020   Lab Results  Component Value Date   NA 127 (L) 04/11/2020   K 3.7 04/11/2020   CO2 19 (L) 04/11/2020   GLUCOSE 103 (H) 04/11/2020   BUN 13 04/11/2020   CREATININE 0.97 04/11/2020   BILITOT 0.9 04/08/2020   ALKPHOS 80 04/08/2020   AST 17 04/08/2020   ALT 11 04/08/2020   PROT 8.3 (H) 04/08/2020   ALBUMIN 3.5 04/08/2020   CALCIUM 8.9 04/11/2020   ANIONGAP 12 04/11/2020   GFR 63.43 02/01/2020   Lab Results  Component Value Date   CHOL 145 01/24/2020   Lab Results  Component Value Date   HDL 68.30 01/24/2020   Lab Results  Component Value Date   LDLCALC 65 01/24/2020   Lab Results  Component Value Date   TRIG 56.0 01/24/2020   Lab Results  Component Value Date   CHOLHDL 2  01/24/2020   Lab Results  Component Value Date   HGBA1C 4.6 01/24/2020      Assessment & Plan:   Problem List Items Addressed This Visit      Musculoskeletal and Integument   Multiple skin tears - Primary   Relevant Orders   AMB referral to wound care center   Laceration of skin of forearm, left, initial encounter   Relevant Orders   AMB referral to wound care center      Meds ordered this encounter  Medications  . DISCONTD: mupirocin ointment (BACTROBAN)  2 %    Sig: Apply to arm wounds twice a day for 2 days and then stop ,    Dispense:  22 g    Refill:  1    Order Specific Question:   Supervising Provider    Answer:   Einar Pheasant [id number]  . mupirocin ointment (BACTROBAN) 2 %    Sig: Apply to arm wounds once a day for 2 days and then stop.    Dispense:  22 g    Refill:  1    Order Specific Question:   Supervising Provider    Answer:   Einar Pheasant [[id number]]   it at edge of chair for a minute before standing. Do not jump up from sitting to standing.   Wound dressing as instructed once a day and apply the Bactroban daily for 2 days and then stop it.   Telfa, gauze wrap and cover with the Coban. Not too tight.   Monitor for signs of infection fever/chills: redness, swelling, drainage, painful, or fever/chills- go to the [facility]  CALL THE OFFICE ON MONDAY  FOR UPDATE- LET us KNOW HOW IT IS LOOKING.  Contact a health care provider if:  You received a tetanus shot and you have swelling, severe pain, redness, or bleeding at the injection site.  You have a fever.  A wound that was closed breaks open.  You notice a bad smell coming from your wound or your dressing.  You notice something coming out of the wound, such as wood or glass.  Your pain is not controlled with medicine.  You have increased redness, swelling, or pain at the site of your wound.  You have fluid, blood, or pus coming from your wound.  You need to change the dressing often  due to fluid, blood, or pus that is draining from the wound.  You develop a new rash.  You develop numbness around the wound. Get help right away if:  You develop severe swelling around the wound.  Your pain suddenly increases and is severe.  You develop painful lumps near the wound or on skin anywhere else on your body.  You have a red streak going away from your wound.  The wound is on your hand or foot and you cannot properly move a finger or toe.  The wound is on your hand or foot, and you notice that your fingers or toes look pale or bluish.   Follow-up: Return if symptoms worsen or fail to improve.  This visit occurred during the SARS-CoV-2 public health emergency.  Safety protocols were in place, including screening questions prior to the visit, additional usage of staff PPE, and extensive cleaning of exam room while observing appropriate contact time as indicated for disinfecting solutions.    Denice Paradise, NP

## 2020-07-03 ENCOUNTER — Telehealth: Payer: Self-pay | Admitting: Internal Medicine

## 2020-07-03 ENCOUNTER — Encounter: Payer: Self-pay | Admitting: Internal Medicine

## 2020-07-03 NOTE — Telephone Encounter (Signed)
The patient stated he is doing fine regarding his fall and skin tear.

## 2020-07-03 NOTE — Telephone Encounter (Signed)
Pt scheduled with Dr Scott 

## 2020-07-03 NOTE — Telephone Encounter (Signed)
Patient scheduled with Dr Scott.  

## 2020-07-05 ENCOUNTER — Encounter: Payer: Self-pay | Admitting: Emergency Medicine

## 2020-07-05 ENCOUNTER — Emergency Department: Payer: PPO

## 2020-07-05 ENCOUNTER — Other Ambulatory Visit: Payer: Self-pay

## 2020-07-05 ENCOUNTER — Ambulatory Visit (INDEPENDENT_AMBULATORY_CARE_PROVIDER_SITE_OTHER): Payer: PPO | Admitting: Internal Medicine

## 2020-07-05 ENCOUNTER — Emergency Department
Admission: EM | Admit: 2020-07-05 | Discharge: 2020-07-05 | Disposition: A | Discharge status: Home / Self Care | Payer: PPO | Attending: Emergency Medicine | Admitting: Emergency Medicine

## 2020-07-05 ENCOUNTER — Encounter: Payer: Self-pay | Admitting: Internal Medicine

## 2020-07-05 DIAGNOSIS — Z87891 Personal history of nicotine dependence: Secondary | ICD-10-CM | POA: Diagnosis not present

## 2020-07-05 DIAGNOSIS — Z79899 Other long term (current) drug therapy: Secondary | ICD-10-CM | POA: Insufficient documentation

## 2020-07-05 DIAGNOSIS — S0990XA Unspecified injury of head, initial encounter: Secondary | ICD-10-CM | POA: Insufficient documentation

## 2020-07-05 DIAGNOSIS — Z7901 Long term (current) use of anticoagulants: Secondary | ICD-10-CM | POA: Diagnosis not present

## 2020-07-05 DIAGNOSIS — I6782 Cerebral ischemia: Secondary | ICD-10-CM | POA: Diagnosis not present

## 2020-07-05 DIAGNOSIS — S199XXA Unspecified injury of neck, initial encounter: Secondary | ICD-10-CM | POA: Diagnosis not present

## 2020-07-05 DIAGNOSIS — T148XXA Other injury of unspecified body region, initial encounter: Secondary | ICD-10-CM | POA: Diagnosis not present

## 2020-07-05 DIAGNOSIS — M4312 Spondylolisthesis, cervical region: Secondary | ICD-10-CM | POA: Diagnosis not present

## 2020-07-05 DIAGNOSIS — E871 Hypo-osmolality and hyponatremia: Secondary | ICD-10-CM

## 2020-07-05 DIAGNOSIS — G8929 Other chronic pain: Secondary | ICD-10-CM | POA: Diagnosis not present

## 2020-07-05 DIAGNOSIS — F1029 Alcohol dependence with unspecified alcohol-induced disorder: Secondary | ICD-10-CM | POA: Diagnosis not present

## 2020-07-05 DIAGNOSIS — R55 Syncope and collapse: Secondary | ICD-10-CM

## 2020-07-05 DIAGNOSIS — G319 Degenerative disease of nervous system, unspecified: Secondary | ICD-10-CM | POA: Diagnosis not present

## 2020-07-05 DIAGNOSIS — Z7951 Long term (current) use of inhaled steroids: Secondary | ICD-10-CM | POA: Insufficient documentation

## 2020-07-05 DIAGNOSIS — R519 Headache, unspecified: Secondary | ICD-10-CM | POA: Diagnosis not present

## 2020-07-05 DIAGNOSIS — W19XXXA Unspecified fall, initial encounter: Secondary | ICD-10-CM | POA: Insufficient documentation

## 2020-07-05 DIAGNOSIS — I2510 Atherosclerotic heart disease of native coronary artery without angina pectoris: Secondary | ICD-10-CM

## 2020-07-05 DIAGNOSIS — Z951 Presence of aortocoronary bypass graft: Secondary | ICD-10-CM | POA: Diagnosis not present

## 2020-07-05 DIAGNOSIS — I10 Essential (primary) hypertension: Secondary | ICD-10-CM | POA: Diagnosis not present

## 2020-07-05 DIAGNOSIS — J439 Emphysema, unspecified: Secondary | ICD-10-CM | POA: Diagnosis not present

## 2020-07-05 DIAGNOSIS — J441 Chronic obstructive pulmonary disease with (acute) exacerbation: Secondary | ICD-10-CM | POA: Diagnosis not present

## 2020-07-05 DIAGNOSIS — R197 Diarrhea, unspecified: Secondary | ICD-10-CM

## 2020-07-05 DIAGNOSIS — I4892 Unspecified atrial flutter: Secondary | ICD-10-CM | POA: Diagnosis not present

## 2020-07-05 DIAGNOSIS — R296 Repeated falls: Secondary | ICD-10-CM | POA: Diagnosis not present

## 2020-07-05 DIAGNOSIS — G4734 Idiopathic sleep related nonobstructive alveolar hypoventilation: Secondary | ICD-10-CM

## 2020-07-05 LAB — BASIC METABOLIC PANEL
Anion gap: 12 (ref 5–15)
BUN: 17 mg/dL (ref 8–23)
CO2: 26 mmol/L (ref 22–32)
Calcium: 9.7 mg/dL (ref 8.9–10.3)
Chloride: 90 mmol/L — ABNORMAL LOW (ref 98–111)
Creatinine, Ser: 1.08 mg/dL (ref 0.61–1.24)
GFR, Estimated: >60 mL/min (ref >60)
Glucose, Bld: 96 mg/dL (ref 70–99)
Potassium: 4 mmol/L (ref 3.5–5.1)
Sodium: 128 mmol/L — ABNORMAL LOW (ref 135–145)

## 2020-07-05 LAB — CBC
HCT: 31.6 % — ABNORMAL LOW (ref 39.0–52.0)
Hemoglobin: 10.6 g/dL — ABNORMAL LOW (ref 13.0–17.0)
MCH: 37.1 pg — ABNORMAL HIGH (ref 26.0–34.0)
MCHC: 33.5 g/dL (ref 30.0–36.0)
MCV: 110.5 fL — ABNORMAL HIGH (ref 80.0–100.0)
Platelets: 366 10*3/uL (ref 150–400)
RBC: 2.86 MIL/uL — ABNORMAL LOW (ref 4.22–5.81)
RDW: 15.2 % (ref 11.5–15.5)
WBC: 9.2 10*3/uL (ref 4.0–10.5)
nRBC: 0 % (ref 0.0–0.2)

## 2020-07-05 NOTE — Assessment & Plan Note (Signed)
Two episodes recently.  This last one - hit his head.  Increased nausea.  Headache.  Discussed need for head scan and further ER evaluation. Pt agreed. ER notified.  Declined ambulance transfer. Wife contacted and to take pt to ER.

## 2020-07-05 NOTE — Assessment & Plan Note (Signed)
After fall recently.  Arm bandaged.  Referral placed for wound clinic.  Will need dressing change and wound f/u.

## 2020-07-05 NOTE — ED Triage Notes (Signed)
Pt comes into the ED via POV c/o fall yesterday with LOC.  Pt has been seen for multiple falls recently.  Pt hit his head yesterday and does take blood thinners (Eliquis).  Pt is currently neurologically intact but does admit to nausea since the falls.

## 2020-07-05 NOTE — Assessment & Plan Note (Signed)
He has adjusted his blood pressure medication with concerns regarding his pressure dropping.  Has seen cardiology and medication adjusted.  He is off isosorbide. Only taking 1/2 metoprolol.  Blood pressure today 120/78 after sitting in room.  Unable to do orthostatics.

## 2020-07-05 NOTE — Assessment & Plan Note (Signed)
Diarrhea started yesterday.  With some increased cough/congestion starting two weeks ago.  SOB with exertion. No chest pain or tightness.  On oxygen at night.  Some nausea - but did hit his head.  This started after head injury.  Question of covid.  Discussed with pt.  With syncope, headache, nausea, etc - to ER for full w/up and evaluation.

## 2020-07-05 NOTE — Assessment & Plan Note (Signed)
Has a history of alcohol dependence.  Have encouraged him to stop drinking.

## 2020-07-05 NOTE — Assessment & Plan Note (Signed)
Increased headache and nausea after passed out and hit his head.  Concern regarding possible bleed, concussion, etc.  Discussed the need for ER evaluation and scanning and further w/up.  Pt agreeable.  Declines ambulance transfer.  Pt wife agreed to take him to ER.

## 2020-07-05 NOTE — Assessment & Plan Note (Addendum)
On oxygen at night.  Follow. 88% initially here.  Recheck 94% on room air.

## 2020-07-05 NOTE — Assessment & Plan Note (Signed)
Is s/p cardioversion.  On eliquis.  No chest pain.  SOB with exertion - uses oxygen at night.  Did not use last night. Pulse ox initially 88% with recheck 94% on room air.  Denies increased heart rate or palpitations.

## 2020-07-05 NOTE — Assessment & Plan Note (Signed)
Has a history of low sodium previously.  Has not been eating. Concern regarding sodium may be lower.  Discussed the need for ER evaluation.  Pt agreeable.  ER notified.

## 2020-07-05 NOTE — ED Provider Notes (Signed)
[facility] Emergency Department Provider Note   ____________________________________________    I have reviewed the triage vital signs and the nursing notes.   HISTORY  Chief Complaint Fall     HPI Troy Lopez is a 79 y.o. male who presents after a fall 2 days ago.  Patient reports he lost his balance and fell forward striking his head.  He is on Eliquis.  He does have a history of chronic hyponatremia.  Sent from PCPs office for evaluation due to head injury and blood thinner.  Patient denies neuro deficits.  No chest pain no nausea vomiting or headache.  No back pain.  Does report a history of frequent falls  Past Medical History:  Diagnosis Date  . Adrenal gland anomaly    enlargement  . Anemia    IDA  . Anxiety   . CAD (coronary artery disease)   . Carpal tunnel syndrome   . Chronic back pain   . Chronic hyponatremia   . Colitis   . Colonic polyp   . COPD (chronic obstructive pulmonary disease) ([place])   . Degenerative disc disease, lumbar   . Depression   . Diverticulosis   . Dyspnea   . Gastric AVM   . GERD (gastroesophageal reflux disease)   . H/O degenerative disc disease   . Hypercholesterolemia   . Hyperkalemia   . Hyperlipidemia   . Hypertension   . Irritable bowel syndrome   . Monoclonal gammopathy   . Monoclonal gammopathy   . Neuropathy   . Personal history of tobacco use, presenting hazards to health 08/17/2015  . Sleep apnea   . Vertebral compression fracture [facility])     Patient Active Problem List   Diagnosis Date Noted  . Multiple skin tears 06/30/2020  . Laceration of skin of forearm, left, initial encounter 06/30/2020  . Nocturnal hypoxia 06/27/2020  . Cellulitis of right foot 04/09/2020  . Renal lesion 03/12/2020  . Post-nasal drip 11/24/2019  . Skin nodule 09/28/2019  . Scalp lesion 06/20/2019  . Swelling of lower extremity 03/15/2019  . Senile osteoporosis 02/24/2019  . Neuropathy 01/01/2019  .  Macrocytic anemia 12/22/2018  . Other fatigue 12/22/2018  . ILD (interstitial lung disease) ([place]) 10/06/2018  . Peripheral edema 06/10/2018  . Lumbar radiculopathy (Right L1/2) 05/21/2018  . Lumbar degenerative disc disease 05/21/2018  . Lumbar foraminal stenosis 05/21/2018  . Chronic pain syndrome 05/21/2018  . Syncope 03/05/2018  . Constipation 10/29/2017  . Fall 10/29/2017  . Rib pain on right side 10/29/2017  . Pulmonary hypertension ([place]) 05/02/2017  . Atrial flutter ([place]) 05/02/2017  . Thoracic aortic aneurysm ([place]) 04/29/2017  . Abnormal feces   . Rectal polyp   . Chest pain 03/03/2017  . Blood in stool   . 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 Alcohol dependence ([place]) 01/30/2017  . History of compression fracture of spine 12/08/2016  . Moderate COPD (chronic obstructive pulmonary disease) ([place]) 07/22/2016  . COPD exacerbation ([place]) 07/22/2016  . Mild depression ([place]) 07/21/2016  . Skin lesion 01/21/2016  . Nasal congestion 09/28/2015  . Sleep difficulties 09/26/2015  . Personal history of tobacco use, presenting hazards to health 08/17/2015  . Health care maintenance 10/23/2014  . Collagenous colitis 09/16/2014  . Leg pain 03/20/2014  . Hyperlipemia 12/22/2013  . Shortness of breath 11/21/2013  . Diarrhea 05/11/2013  . Headache 05/11/2013  . Ulcer 02/02/2013  . Chronic back pain 02/02/2013  . GERD (gastroesophageal reflux disease) 09/13/2012  . Sleep  apnea 09/13/2012  . Iron deficiency anemia 09/08/2012  . 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 CAD (coronary artery disease) 09/08/2012  . Hypertension 09/08/2012  . Hyponatremia 09/08/2012    Past Surgical History:  Procedure Laterality Date  . 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/09/2020   Procedure: AMPUTATION TOE;  Surgeon: Sharlotte Alamo, DPM;  Location: [facility] ORS;  Service: Podiatry;  Laterality: Right;  . BUNIONECTOMY  1989  . CARDIAC CATHETERIZATION    . CARPAL TUNNEL RELEASE  Left 2011   ulnar nerve sub muscular at elbow  . CHOLECYSTECTOMY  09/07  . COLONOSCOPY WITH PROPOFOL N/A 03/05/2017   Procedure: COLONOSCOPY WITH PROPOFOL;  Surgeon: Lucilla Lame, MD;  [facility] ENDOSCOPY;  Service: Endoscopy;  Laterality: N/A;  . ELECTROPHYSIOLOGIC STUDY N/A 11/15/2015   Procedure: CARDIOVERSION;  Surgeon: Isaias Cowman, MD;  Location: [facility] ORS;  Service: Cardiovascular;  Laterality: N/A;  . ESOPHAGOGASTRODUODENOSCOPY N/A 04/10/2020   Procedure: ESOPHAGOGASTRODUODENOSCOPY (EGD);  Surgeon: [place], Benay Pike, MD;  Location: [facility] ENDOSCOPY;  Service: Gastroenterology;  Laterality: N/A;  . ESOPHAGOGASTRODUODENOSCOPY (EGD) WITH PROPOFOL N/A 03/05/2017   Procedure: ESOPHAGOGASTRODUODENOSCOPY (EGD) WITH PROPOFOL;  Surgeon: Lucilla Lame, MD;  Location: [facility] ENDOSCOPY;  Service: Endoscopy;  Laterality: N/A;  . ESOPHAGOGASTRODUODENOSCOPY (EGD) WITH PROPOFOL N/A 10/17/2017   Procedure: ESOPHAGOGASTRODUODENOSCOPY (EGD) WITH PROPOFOL;  Surgeon: Lollie Sails, MD;  Location: [facility] ENDOSCOPY;  Service: Endoscopy;  Laterality: N/A;  . EYE SURGERY Bilateral 2010   cataract  . HEMORRHOID SURGERY    . KYPHOPLASTY N/A 11/04/2016   Procedure: KYPHOPLASTY T 12;  Surgeon: Hessie Knows, MD;  Location: [facility] ORS;  Service: Orthopedics;  Laterality: N/A;    Prior to Admission medications   Medication Sig Start Date End Date Taking? Authorizing Provider  acetaminophen (TYLENOL) 500 MG tablet Take 500-1,000 mg by mouth 3 (three) times daily as needed for moderate pain or headache.    [provider]  albuterol (VENTOLIN HFA) 108 (90 Base) MCG/ACT inhaler Inhale 2 puffs into the lungs every 6 (six) hours as needed for wheezing or shortness of breath. 02/14/20   Brand Males, MD  azelastine (ASTELIN) 0.1 % nasal spray Place 1 spray into both nostrils 2 (two) times daily. Use in each nostril as directed 05/03/20   Einar Pheasant, MD  budesonide (ENTOCORT EC) 3 MG 24 hr capsule Take 9  mg by mouth daily as needed (colitis flare).  05/31/15   [provider]  calcium-vitamin D (OSCAL WITH D) 500-200 MG-UNIT tablet Take 2 tablets by mouth daily.    [provider]  ELIQUIS 5 MG TABS tablet Take 5 mg by mouth 2 (two) times daily.  05/23/16   [provider]  FIBER PO Take 1 capsule by mouth daily.     [provider]  fluticasone (FLONASE) 50 MCG/ACT nasal spray Place 1 spray into both nostrils daily. 11/24/19   [name], NP  Fluticasone-Umeclidin-Vilant (TRELEGY ELLIPTA) 100-62.5-25 MCG/INH AEPB Inhale 1 puff into the lungs daily. 02/14/20   Brand Males, MD  folic acid (FOLVITE) 1 MG tablet Take 1 tablet (1 mg total) by mouth daily. Can take any over-the-counter supplement. 04/11/20   [name], MD  furosemide (LASIX) 20 MG tablet Take 20 mg by mouth 2 (two) times daily.    [provider]  gabapentin (NEURONTIN) 100 MG capsule Take 4 capsules (400 mg) daily in the morning and midday. Take 8 capsules (800 mg) daily in the evening. 06/07/20   Einar Pheasant, MD  metoprolol tartrate (LOPRESSOR)  100 MG tablet Take 1 tablet (100 mg total) by mouth 2 (two) times daily. 05/23/20   Einar Pheasant, MD  pantoprazole (PROTONIX) 40 MG tablet Take 1 tablet (40 mg total) by mouth 2 (two) times daily. 06/30/20   Einar Pheasant, MD  sertraline (ZOLOFT) 50 MG tablet Take 1 tablet (50 mg total) by mouth daily. 06/30/20   Einar Pheasant, MD  triamcinolone ointment (KENALOG) 0.1 %  06/24/19   [provider]  vitamin B-12 (CYANOCOBALAMIN) 1000 MCG tablet Take 1,000 mcg by mouth daily.    [provider]     Allergies Iodinated diagnostic agents  Family History  Problem Relation Age of Onset  . Stroke Mother   . Heart disease Father        MI - 75   . Colon cancer Neg Hx   . Prostate cancer Neg Hx     Social History Social History   Tobacco Use  . Smoking status: Former Smoker    Packs/day: 2.00    Years: 58.00     Pack years: 116.00    Types: Cigarettes    Start date: 08/19/1957    Quit date: 11/01/2015    Years since quitting: 4.6  . Smokeless tobacco: Never Used  Vaping Use  . Vaping Use: Never used  Substance Use Topics  . Alcohol use: Yes    Alcohol/week: 42.0 standard drinks    Types: 42 Cans of beer per week    Comment: occas  . Drug use: No    Review of Systems  Constitutional: No fever/chills Eyes: No visual changes.  ENT: Mild neck soreness Cardiovascular: Denies chest pain. Respiratory: Denies shortness of breath. Gastrointestinal: No abdominal pain.  No nausea, no vomiting.   Genitourinary: Negative for dysuria. Musculoskeletal: Negative for back pain. Skin: Negative for rash. Neurological: Negative for headaches or weakness   ____________________________________________   PHYSICAL EXAM:  VITAL SIGNS: ED Triage Vitals  Enc Vitals Group     BP 07/05/20 1347 (!) 145/78     Pulse Rate 07/05/20 1347 63     Resp 07/05/20 1347 18     Temp 07/05/20 1347 98.1 F (36.7 C)     Temp Source 07/05/20 1347 Oral     SpO2 07/05/20 1347 98 %     Weight 07/05/20 1348 79.4 kg (175 lb)     Height 07/05/20 1348 1.778 m (5\' 10" )     Head Circumference --      Peak Flow --      Pain Score 07/05/20 1347 2     Pain Loc --      Pain Edu? --      Excl. in [place]? --     Constitutional: Alert and oriented. No acute distress Eyes: Conjunctivae are normal.  Head: Abrasion to the central forehead Nose: No congestion/rhinnorhea. Mouth/Throat: Mucous membranes are moist.   Neck:  Painless ROM, no vertebral tenderness palpation Cardiovascular: Normal rate, regular rhythm.  Good peripheral circulation.  No chest wall tenderness palpation Respiratory: Normal respiratory effort.  No retractions. Lungs CTAB. Gastrointestinal: Soft and nontender. No distention.  No CVA tenderness.  Musculoskeletal: No lower extremity tenderness nor edema.  Warm and well perfused Neurologic:  Normal speech  and language. No gross focal neurologic deficits are appreciated.  Skin:  Skin is warm, dry Psychiatric: Mood and affect are normal. Speech and behavior are normal.  ____________________________________________   LABS (all labs ordered are listed, but only abnormal results are displayed)  Labs Reviewed  BASIC  METABOLIC PANEL - Abnormal; Notable for the following components:      Result Value   Sodium 128 (*)    Chloride 90 (*)    All other components within normal limits  CBC - Abnormal; Notable for the following components:   RBC 2.86 (*)    Hemoglobin 10.6 (*)    HCT 31.6 (*)    MCV 110.5 (*)    MCH 37.1 (*)    All other components within normal limits   ____________________________________________  EKG  ED ECG REPORT I, Lavonia Drafts, the attending physician, personally viewed and interpreted this ECG.  Date: 07/05/2020  Rhythm: normal sinus rhythm QRS Axis: normal Intervals: Right bundle-branch block ST/T Wave abnormalities: normal Narrative Interpretation: no evidence of acute ischemia  ____________________________________________  RADIOLOGY  CT head and cervical spine, reviewed by me, no acute abnormalities per radiology ____________________________________________   PROCEDURES  Procedure(s) performed: No  Procedures   Critical Care performed: No ____________________________________________   INITIAL IMPRESSION / ASSESSMENT AND PLAN / ED COURSE  Pertinent labs & imaging results that were available during my care of the patient were reviewed by me and considered in my medical decision making (see chart for details).  Patient well-appearing and in no acute distress.  CT imaging is reassuring, lab work demonstrates reassuring BUN to creatinine ratio, chronic hyponatremia noted unchanged from prior.  Hemoglobin unchanged from prior.  Overall reassuring work-up, patient is anxiously leave.    ____________________________________________   FINAL  CLINICAL IMPRESSION(S) / ED DIAGNOSES  Final diagnoses:  Fall, initial encounter  Injury of head, initial encounter  Hyponatremia        Note:  This document was prepared using Dragon voice recognition software and may include unintentional dictation errors.   Lavonia Drafts, MD 07/05/20 [phone]

## 2020-07-05 NOTE — Assessment & Plan Note (Signed)
Known CAD.  Denies chest pain.  On eliquis.  Also taking metoprolol.  He has adjusted his medication with issues with low blood pressure.

## 2020-07-05 NOTE — Progress Notes (Signed)
Patient ID: Troy Lopez, male   DOB: 09-23-1940, 79 y.o.   MRN: [id number]   Subjective:    Patient ID: Troy Lopez, male    DOB: [birth year], 79 y.o.   MRN: [id number]  HPI This visit occurred during the SARS-CoV-2 public health emergency.  Safety protocols were in place, including screening questions prior to the visit, additional usage of staff PPE, and extensive cleaning of exam room while observing appropriate contact time as indicated for disinfecting solutions.  Patient here for work in appt .  Was seen 06/30/20 after falling - passed out.  Had abrasion and laceration left arm.  Was seen here and bandaged.  Returned today for a scheduled follow up from the original fall.  On questioning him, he fell again Monday night.  Passed out.  Hit his head.  Not sure how long was out.  Started having diarrhea yesterday.  Increased nausea.  Has headache - hurts posterior head when looking up.  No chest tightness.  Does have some cough.  States felt like he was going to pass out when he walked into the lobby.  Weak.  Decreased appetite.    Past Medical History:  Diagnosis Date  . Adrenal gland anomaly    enlargement  . Anemia    IDA  . Anxiety   . CAD (coronary artery disease)   . Carpal tunnel syndrome   . Chronic back pain   . Chronic hyponatremia   . Colitis   . Colonic polyp   . COPD (chronic obstructive pulmonary disease) ([place])   . Degenerative disc disease, lumbar   . Depression   . Diverticulosis   . Dyspnea   . Gastric AVM   . GERD (gastroesophageal reflux disease)   . H/O degenerative disc disease   . Hypercholesterolemia   . Hyperkalemia   . Hyperlipidemia   . Hypertension   . Irritable bowel syndrome   . Monoclonal gammopathy   . Monoclonal gammopathy   . Neuropathy   . Personal history of tobacco use, presenting hazards to health 08/17/2015  . Sleep apnea   . Vertebral compression fracture [facility])    Past Surgical History:  Procedure Laterality Date  . AMPUTATION  TOE Right 04/09/2020   Procedure: AMPUTATION TOE;  Surgeon: Sharlotte Alamo, DPM;  Location: [facility] ORS;  Service: Podiatry;  Laterality: Right;  . BUNIONECTOMY  1989  . CARDIAC CATHETERIZATION    . CARPAL TUNNEL RELEASE Left 2011   ulnar nerve sub muscular at elbow  . CHOLECYSTECTOMY  09/07  . COLONOSCOPY WITH PROPOFOL N/A 03/05/2017   Procedure: COLONOSCOPY WITH PROPOFOL;  Surgeon: Lucilla Lame, MD;  Location: [facility] ENDOSCOPY;  Service: Endoscopy;  Laterality: N/A;  . ELECTROPHYSIOLOGIC STUDY N/A 11/15/2015   Procedure: CARDIOVERSION;  Surgeon: Isaias Cowman, MD;  Location: [facility] ORS;  Service: Cardiovascular;  Laterality: N/A;  . ESOPHAGOGASTRODUODENOSCOPY N/A 04/10/2020   Procedure: ESOPHAGOGASTRODUODENOSCOPY (EGD);  Surgeon: Toledo, Benay Pike, MD;  Location: [facility] ENDOSCOPY;  Service: Gastroenterology;  Laterality: N/A;  . ESOPHAGOGASTRODUODENOSCOPY (EGD) WITH PROPOFOL N/A 03/05/2017   Procedure: ESOPHAGOGASTRODUODENOSCOPY (EGD) WITH PROPOFOL;  Surgeon: Lucilla Lame, MD;  Location: [facility] ENDOSCOPY;  Service: Endoscopy;  Laterality: N/A;  . ESOPHAGOGASTRODUODENOSCOPY (EGD) WITH PROPOFOL N/A 10/17/2017   Procedure: ESOPHAGOGASTRODUODENOSCOPY (EGD) WITH PROPOFOL;  Surgeon: Lollie Sails, MD;  Location: [facility] ENDOSCOPY;  Service: Endoscopy;  Laterality: N/A;  . EYE SURGERY Bilateral 2010   cataract  . HEMORRHOID SURGERY    . KYPHOPLASTY N/A 11/04/2016   Procedure: KYPHOPLASTY T 12;  Surgeon: Hessie Knows, MD;  Location: [facility] ORS;  Service: Orthopedics;  Laterality: N/A;   Family History  Problem Relation Age of Onset  . Stroke Mother   . Heart disease Father        MI - 81   . Colon cancer Neg Hx   . Prostate cancer Neg Hx    Social History   Socioeconomic History  . Marital status: Married    Spouse name: Not on file  . Number of children: 3  . Years of education: Not on file  . Highest education level: Not on file  Occupational History  . Not on file  Tobacco Use  . Smoking  status: Former Smoker    Packs/day: 2.00    Years: 58.00    Pack years: 116.00    Types: Cigarettes    Start date: 08/19/1957    Quit date: 11/01/2015    Years since quitting: 4.6  . Smokeless tobacco: Never Used  Vaping Use  . Vaping Use: Never used  Substance and Sexual Activity  . Alcohol use: Yes    Alcohol/week: 42.0 standard drinks    Types: 42 Cans of beer per week    Comment: occas  . Drug use: No  . Sexual activity: Not on file  Other Topics Concern  . Not on file  Social History Narrative  . Not on file   Social Determinants of Health   Financial Resource Strain: Medium Risk  . Difficulty of Paying Living Expenses: Somewhat hard  Food Insecurity:   . Worried About Charity fundraiser in the Last Year: Not on file  . Ran Out of Food in the Last Year: Not on file  Transportation Needs:   . Lack of Transportation (Medical): Not on file  . Lack of Transportation (Non-Medical): Not on file  Physical Activity:   . Days of Exercise per Week: Not on file  . Minutes of Exercise per Session: Not on file  Stress: Stress Concern Present  . Feeling of Stress : Rather much  Social Connections:   . Frequency of Communication with Friends and Family: Not on file  . Frequency of Social Gatherings with Friends and Family: Not on file  . Attends Religious Services: Not on file  . Active Member of Clubs or Organizations: Not on file  . Attends Archivist Meetings: Not on file  . Marital Status: Not on file    Outpatient Encounter Medications as of 07/05/2020  Medication Sig  . acetaminophen (TYLENOL) 500 MG tablet Take 500-1,000 mg by mouth 3 (three) times daily as needed for moderate pain or headache.  . albuterol (VENTOLIN HFA) 108 (90 Base) MCG/ACT inhaler Inhale 2 puffs into the lungs every 6 (six) hours as needed for wheezing or shortness of breath.  Marland Kitchen azelastine (ASTELIN) 0.1 % nasal spray Place 1 spray into both nostrils 2 (two) times daily. Use in each nostril  as directed  . budesonide (ENTOCORT EC) 3 MG 24 hr capsule Take 9 mg by mouth daily as needed (colitis flare).   . calcium-vitamin D (OSCAL WITH D) 500-200 MG-UNIT tablet Take 2 tablets by mouth daily.  Marland Kitchen ELIQUIS 5 MG TABS tablet Take 5 mg by mouth 2 (two) times daily.   Marland Kitchen FIBER PO Take 1 capsule by mouth daily.   . fluticasone (FLONASE) 50 MCG/ACT nasal spray Place 1 spray into both nostrils daily.  . Fluticasone-Umeclidin-Vilant (TRELEGY ELLIPTA) 100-62.5-25 MCG/INH AEPB Inhale 1 puff into the lungs daily.  Marland Kitchen  folic acid (FOLVITE) 1 MG tablet Take 1 tablet (1 mg total) by mouth daily. Can take any over-the-counter supplement.  . furosemide (LASIX) 20 MG tablet Take 20 mg by mouth 2 (two) times daily.  Marland Kitchen gabapentin (NEURONTIN) 100 MG capsule Take 4 capsules (400 mg) daily in the morning and midday. Take 8 capsules (800 mg) daily in the evening.  . metoprolol tartrate (LOPRESSOR) 100 MG tablet Take 1 tablet (100 mg total) by mouth 2 (two) times daily.  . pantoprazole (PROTONIX) 40 MG tablet Take 1 tablet (40 mg total) by mouth 2 (two) times daily.  . sertraline (ZOLOFT) 50 MG tablet Take 1 tablet (50 mg total) by mouth daily.  Marland Kitchen triamcinolone ointment (KENALOG) 0.1 %   . vitamin B-12 (CYANOCOBALAMIN) 1000 MCG tablet Take 1,000 mcg by mouth daily.   No facility-administered encounter medications on file as of 07/05/2020.    Review of Systems  Constitutional: Positive for appetite change and fatigue. Negative for fever.  Respiratory: Positive for cough and shortness of breath. Negative for chest tightness.   Cardiovascular: Negative for chest pain, palpitations and leg swelling.  Gastrointestinal: Positive for diarrhea and nausea. Negative for abdominal pain and vomiting.  Genitourinary: Negative for difficulty urinating and dysuria.  Musculoskeletal: Negative for joint swelling and myalgias.       Increased back pain from fall.    Skin: Negative for rash.       Left arm bandaged.   Abrasion  - hematoma - forehead.   Neurological: Positive for headaches. Negative for dizziness.  Psychiatric/Behavioral: Negative for agitation and dysphoric mood.       Objective:    Physical Exam Vitals reviewed.  Constitutional:      General: He is not in acute distress.    Appearance: He is well-developed.     Comments: Appears not to feel well.   HENT:     Head:     Comments: Abrasion/hematoma - forehead    Right Ear: External ear normal.     Left Ear: External ear normal.  Eyes:     General:        Right eye: No discharge.        Left eye: No discharge.     Conjunctiva/sclera: Conjunctivae normal.  Cardiovascular:     Rate and Rhythm: Normal rate and regular rhythm.  Pulmonary:     Effort: Pulmonary effort is normal. No respiratory distress.     Breath sounds: Normal breath sounds.  Abdominal:     General: Bowel sounds are normal.     Palpations: Abdomen is soft.     Tenderness: There is no abdominal tenderness.  Musculoskeletal:        General: No swelling or tenderness.  Skin:    Comments: Left forearm bandaged.    Psychiatric:        Mood and Affect: Mood normal.        Behavior: Behavior normal.     Pulse 65   Temp 97.9 F (36.6 C) (Oral)   Resp 16   Ht 5\' 10"  (1.778 m)   Wt 184 lb 1.9 oz (83.5 kg)   SpO2 (!) 88%   BMI 26.42 kg/m  Wt Readings from Last 3 Encounters:  07/05/20 184 lb 1.9 oz (83.5 kg)  06/30/20 178 lb (80.7 kg)  06/14/20 178 lb 9.6 oz (81 kg)     Lab Results  Component Value Date   WBC 5.6 06/02/2020   HGB 9.1 (L) 06/02/2020   HCT 27.2 (  L) 06/02/2020   PLT 358 06/02/2020   GLUCOSE 103 (H) 04/11/2020   CHOL 145 01/24/2020   TRIG 56.0 01/24/2020   HDL 68.30 01/24/2020   LDLCALC 65 01/24/2020   ALT 11 04/08/2020   AST 17 04/08/2020   NA 127 (L) 04/11/2020   K 3.7 04/11/2020   CL 96 (L) 04/11/2020   CREATININE 0.97 04/11/2020   BUN 13 04/11/2020   CO2 19 (L) 04/11/2020   TSH 1.98 01/24/2020   PSA 1.48 01/24/2020   INR 1.11  01/08/2017   HGBA1C 4.6 01/24/2020   MICROALBUR 1.9 10/27/2017    MR FOOT RIGHT WO CONTRAST  Result Date: 04/09/2020 CLINICAL DATA:  History of a skin ulceration on the right second toe. The patient is status post debridement of the wound 04/05/2020. Continued redness, pain and swelling. EXAM: MRI OF THE RIGHT FOREFOOT WITHOUT CONTRAST TECHNIQUE: Multiplanar, multisequence MR imaging of the right forefoot was performed. No intravenous contrast was administered. COMPARISON:  Plain films right foot 04/09/2020. FINDINGS: Bones/Joint/Cartilage There is no marrow edema to suggest osteomyelitis. The patient has remote healed fractures of the distal first and proximal second metatarsals. No acute fracture is identified. The PIP joint of the second toe is laterally subluxed as seen on the prior plain films. Hallux valgus deformity is noted. 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 Ligaments The medial collateral ligament of the PIP joint of the second toe is difficult to visualize and may be torn. Otherwise negative. Muscles and Tendons No intramuscular fluid collection, tear or strain. Fatty atrophy of intrinsic musculature the foot noted. Soft tissues No focal fluid collection. Subcutaneous edema is seen in the dorsum of the foot. IMPRESSION: Negative for osteomyelitis, abscess or septic joint. Lateral subluxation PIP joint of the second toe. The medial collateral ligament of the PIP joint is not visible and may be torn. Remote healed first and second metatarsal fractures. Hallux valgus and first MTP osteoarthritis. Subcutaneous edema in the dorsum of the foot could be due to dependent change or cellulitis. Electronically Signed   By: Inge Rise M.D.   On: 04/09/2020 14:21   DG Foot Complete Right  Result Date: 04/09/2020 CLINICAL DATA:  Foot swelling, evaluate for osteomyelitis EXAM: RIGHT FOOT COMPLETE - 3+ VIEW COMPARISON:  Radiograph 10/09/2017 FINDINGS: Diffuse circumferential swelling of the foot.  Questionable ulceration along the plantar and lateral aspect adjacent the head of the fifth metatarsal. No other radiographically evident sites of ulceration are evident. No soft tissue gas or foreign body is seen. No osseous erosion, destructive change or periostitis to suggest osteomyelitis at this location or elsewhere within the foot at this time. There are posttraumatic deformities of the first metatarsal and second metatarsal base. There is a volar dislocation of the second proximal interphalangeal joint. 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 Hallux valgus deformity with chronic soft tissue thickening along the medial head of the first metatarsal. Vascular calcium in the soft tissues. IMPRESSION: 1. Questionable ulceration along the plantar and lateral aspect adjacent the head of the fifth metatarsal. Correlate with visual inspection. 2. No other radiographically evident sites of ulceration within the foot at this time. 3. No radiographic evidence of osteomyelitis. If there is persisting clinical concern, MRI imaging is much more sensitive and specific for early features of osteomyelitis. 4. 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 5. Volar dislocation of the second proximal interphalangeal  joint, new from prior. 6. Remote posttraumatic deformity of the first and second metatarsals. 7. Hallux valgus. Electronically Signed   By: Lovena Le M.D.   On: 04/09/2020 02:14       Assessment & Plan:   Problem List Items Addressed This Visit    Syncope    Two episodes recently.  This last one - hit his head.  Increased nausea.  Headache.  Discussed need for head scan and further ER evaluation. Pt agreed. ER notified.  Declined ambulance transfer. Wife contacted and to take pt to ER.       Nocturnal hypoxia    On oxygen at night.  Follow. 88% initially here.  Recheck  94% on room air.       Multiple skin tears    After fall recently.  Arm bandaged.  Referral placed for wound clinic.  Will need dressing change and wound f/u.        Hyponatremia    Has a history of low sodium previously.  Has not been eating. Concern regarding sodium may be lower.  Discussed the need for ER evaluation.  Pt agreeable.  ER notified.        Hypertension    He has adjusted his blood pressure medication with concerns regarding his pressure dropping.  Has seen cardiology and medication adjusted.  He is off isosorbide. Only taking 1/2 metoprolol.  Blood pressure today 120/78 after sitting in room.  Unable to do orthostatics.        Headache    Increased headache and nausea after passed out and hit his head.  Concern regarding possible bleed, concussion, etc.  Discussed the need for ER evaluation and scanning and further w/up.  Pt agreeable.  Declines ambulance transfer.  Pt wife agreed to take him to ER.       Diarrhea    Diarrhea started yesterday.  With some increased cough/congestion starting two weeks ago.  SOB with exertion. No chest pain or tightness.  On oxygen at night.  Some nausea - but did hit his head.  This started after head injury.  Question of covid.  Discussed with pt.  With syncope, headache, nausea, etc - to ER for full w/up and evaluation.        CAD (coronary artery disease)    Known CAD.  Denies chest pain.  On eliquis.  Also taking metoprolol.  He has adjusted his medication with issues with low blood pressure.        Atrial flutter ([place])    Is s/p cardioversion.  On eliquis.  No chest pain.  SOB with exertion - uses oxygen at night.  Did not use last night. Pulse ox initially 88% with recheck 94% on room air.  Denies increased heart rate or palpitations.        Alcohol dependence ([place])    Has a history of alcohol dependence.  Have encouraged him to stop drinking.           Einar Pheasant, MD

## 2020-07-05 NOTE — Progress Notes (Signed)
Pt is here for a follow up from his last fall but pt stated that he fell again on Monday night. Pt stated that when he fell he fell flat down hitting the front of his forehead. Pt stated that his neck, head and shoulders are all hurting. Pt also stated that he does not feel well today. Pt stated that he had 5 loose bowels yesterday, loss of appetite and feels nauseated.

## 2020-07-07 ENCOUNTER — Other Ambulatory Visit: Payer: Self-pay

## 2020-07-07 ENCOUNTER — Telehealth: Payer: Self-pay | Admitting: Internal Medicine

## 2020-07-07 ENCOUNTER — Ambulatory Visit
Admission: EM | Admit: 2020-07-07 | Discharge: 2020-07-07 | Disposition: A | Discharge status: Home / Self Care | Payer: PPO | Source: Home / Self Care

## 2020-07-07 ENCOUNTER — Emergency Department
Admission: EM | Admit: 2020-07-07 | Discharge: 2020-07-07 | Disposition: A | Discharge status: Home / Self Care | Payer: PPO | Attending: Emergency Medicine | Admitting: Emergency Medicine

## 2020-07-07 ENCOUNTER — Encounter: Payer: Self-pay | Admitting: Internal Medicine

## 2020-07-07 DIAGNOSIS — E860 Dehydration: Secondary | ICD-10-CM

## 2020-07-07 DIAGNOSIS — F17210 Nicotine dependence, cigarettes, uncomplicated: Secondary | ICD-10-CM | POA: Insufficient documentation

## 2020-07-07 DIAGNOSIS — R110 Nausea: Secondary | ICD-10-CM | POA: Diagnosis not present

## 2020-07-07 DIAGNOSIS — I10 Essential (primary) hypertension: Secondary | ICD-10-CM | POA: Insufficient documentation

## 2020-07-07 DIAGNOSIS — J441 Chronic obstructive pulmonary disease with (acute) exacerbation: Secondary | ICD-10-CM | POA: Diagnosis not present

## 2020-07-07 DIAGNOSIS — I2510 Atherosclerotic heart disease of native coronary artery without angina pectoris: Secondary | ICD-10-CM | POA: Insufficient documentation

## 2020-07-07 DIAGNOSIS — E871 Hypo-osmolality and hyponatremia: Secondary | ICD-10-CM

## 2020-07-07 DIAGNOSIS — Z7901 Long term (current) use of anticoagulants: Secondary | ICD-10-CM | POA: Diagnosis not present

## 2020-07-07 DIAGNOSIS — R55 Syncope and collapse: Secondary | ICD-10-CM | POA: Diagnosis not present

## 2020-07-07 DIAGNOSIS — Z79899 Other long term (current) drug therapy: Secondary | ICD-10-CM | POA: Diagnosis not present

## 2020-07-07 LAB — CBC
HCT: 30 % — ABNORMAL LOW (ref 39.0–52.0)
Hemoglobin: 9.9 g/dL — ABNORMAL LOW (ref 13.0–17.0)
MCH: 36.4 pg — ABNORMAL HIGH (ref 26.0–34.0)
MCHC: 33 g/dL (ref 30.0–36.0)
MCV: 110.3 fL — ABNORMAL HIGH (ref 80.0–100.0)
Platelets: 349 10*3/uL (ref 150–400)
RBC: 2.72 MIL/uL — ABNORMAL LOW (ref 4.22–5.81)
RDW: 14.7 % (ref 11.5–15.5)
WBC: 8.3 10*3/uL (ref 4.0–10.5)
nRBC: 0 % (ref 0.0–0.2)

## 2020-07-07 LAB — BASIC METABOLIC PANEL
Anion gap: 12 (ref 5–15)
Anion gap: 10 (ref 5–15)
BUN: 17 mg/dL (ref 8–23)
BUN: 16 mg/dL (ref 8–23)
CO2: 23 mmol/L (ref 22–32)
CO2: 25 mmol/L (ref 22–32)
Calcium: 9.2 mg/dL (ref 8.9–10.3)
Calcium: 9.4 mg/dL (ref 8.9–10.3)
Chloride: 92 mmol/L — ABNORMAL LOW (ref 98–111)
Chloride: 91 mmol/L — ABNORMAL LOW (ref 98–111)
Creatinine, Ser: 0.99 mg/dL (ref 0.61–1.24)
Creatinine, Ser: 1.04 mg/dL (ref 0.61–1.24)
GFR, Estimated: >60 mL/min (ref >60)
GFR, Estimated: >60 mL/min (ref >60)
Glucose, Bld: 83 mg/dL (ref 70–99)
Glucose, Bld: 105 mg/dL — ABNORMAL HIGH (ref 70–99)
Potassium: 4.6 mmol/L (ref 3.5–5.1)
Potassium: 4 mmol/L (ref 3.5–5.1)
Sodium: 127 mmol/L — ABNORMAL LOW (ref 135–145)
Sodium: 126 mmol/L — ABNORMAL LOW (ref 135–145)

## 2020-07-07 MED ORDER — ONDANSETRON 8 MG PO TBDP
8.0000 mg | ORAL_TABLET | Freq: Once | ORAL | Status: AC
  Administered 2020-07-07: 8 mg via ORAL

## 2020-07-07 MED ORDER — SODIUM CHLORIDE 0.9 % IV BOLUS: 1000.0000 mL | Freq: Once | INTRAVENOUS | Status: DC

## 2020-07-07 MED ORDER — ONDANSETRON 4 MG PO TBDP
4.0000 mg | ORAL_TABLET | Freq: Once | ORAL | Status: DC
  Filled 2020-07-07: qty 1

## 2020-07-07 MED ORDER — ONDANSETRON 4 MG PO TBDP: 4.0000 mg | ORAL_TABLET | Freq: Three times a day (TID) | ORAL | 0 refills | Status: AC | PRN

## 2020-07-07 NOTE — Telephone Encounter (Signed)
Patient agreed to go to urgent care but refused to go back to ED. He is going to hold his lasix and follow up with [place] after.

## 2020-07-07 NOTE — ED Triage Notes (Signed)
PT to ED from urgent care. Was dx with concussion the other day and has been nauseated since, thus no eating or drinking well. PT noticed this AM his urine was dark. Went to UC to get checked out. UC attempted phlebotomy x5 with no success so they sent pt here.

## 2020-07-07 NOTE — ED Provider Notes (Signed)
[facility] Emergency Department Provider Note   ____________________________________________   First MD Initiated Contact with Patient 07/07/20 1650     (approximate)  I have reviewed the triage vital signs and the nursing notes.   HISTORY  Chief Complaint Dehydration    HPI Troy Lopez is a 79 y.o. male who fell and hit his head on the 17th.  CT the head was negative.  Patient had the diagnosis of a concussion but has been nauseated since then gradually getting a little bit better.  He has not been eating or drinking much since falling.  He noticed this morning his urine was dark.  He did go to urgent care and reportedly they could not get blood so he was sent here.  Patient sodium was 126 here but it has been even lower in the past.  His hyponatremia is chronic.  He reports his nausea and headaches are getting a little bit better every day.  I will give him some fluid here and some Zofran and see if that helps.  We will see if we can discharge him.         Past Medical History:  Diagnosis Date  . Adrenal gland anomaly    enlargement  . Anemia    IDA  . Anxiety   . CAD (coronary artery disease)   . Carpal tunnel syndrome   . Chronic back pain   . Chronic hyponatremia   . Colitis   . Colonic polyp   . COPD (chronic obstructive pulmonary disease) ([place])   . Degenerative disc disease, lumbar   . Depression   . Diverticulosis   . Dyspnea   . Gastric AVM   . GERD (gastroesophageal reflux disease)   . H/O degenerative disc disease   . Hypercholesterolemia   . Hyperkalemia   . Hyperlipidemia   . Hypertension   . Irritable bowel syndrome   . Monoclonal gammopathy   . Monoclonal gammopathy   . Neuropathy   . Personal history of tobacco use, presenting hazards to health 08/17/2015  . Sleep apnea   . Vertebral compression fracture [facility])     Patient Active Problem List   Diagnosis Date Noted  . Multiple skin tears 06/30/2020  .  Laceration of skin of forearm, left, initial encounter 06/30/2020  . Nocturnal hypoxia 06/27/2020  . Cellulitis of right foot 04/09/2020  . Renal lesion 03/12/2020  . Post-nasal drip 11/24/2019  . Skin nodule 09/28/2019  . Scalp lesion 06/20/2019  . Swelling of lower extremity 03/15/2019  . Senile osteoporosis 02/24/2019  . Neuropathy 01/01/2019  . Macrocytic anemia 12/22/2018  . Other fatigue 12/22/2018  . ILD (interstitial lung disease) ([place]) 10/06/2018  . Peripheral edema 06/10/2018  . Lumbar radiculopathy (Right L1/2) 05/21/2018  . Lumbar degenerative disc disease 05/21/2018  . Lumbar foraminal stenosis 05/21/2018  . Chronic pain syndrome 05/21/2018  . Syncope 03/05/2018  . Constipation 10/29/2017  . Fall 10/29/2017  . Rib pain on right side 10/29/2017  . Pulmonary hypertension ([place]) 05/02/2017  . Atrial flutter ([place]) 05/02/2017  . Thoracic aortic aneurysm ([place]) 04/29/2017  . Abnormal feces   . Rectal polyp   . Chest pain 03/03/2017  . Blood in stool   . 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 Alcohol dependence ([place]) 01/30/2017  . History of compression fracture of spine 12/08/2016  . Moderate COPD (chronic obstructive pulmonary disease) ([place]) 07/22/2016  . COPD exacerbation ([place]) 07/22/2016  . Mild depression ([place]) 07/21/2016  . Skin lesion  01/21/2016  . Nasal congestion 09/28/2015  . Sleep difficulties 09/26/2015  . Personal history of tobacco use, presenting hazards to health 08/17/2015  . Health care maintenance 10/23/2014  . Collagenous colitis 09/16/2014  . Leg pain 03/20/2014  . Hyperlipemia 12/22/2013  . Shortness of breath 11/21/2013  . Diarrhea 05/11/2013  . Headache 05/11/2013  . Ulcer 02/02/2013  . Chronic back pain 02/02/2013  . GERD (gastroesophageal reflux disease) 09/13/2012  . Sleep apnea 09/13/2012  . Iron deficiency anemia 09/08/2012  . 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 CAD (coronary artery disease)  09/08/2012  . Hypertension 09/08/2012  . Hyponatremia 09/08/2012    Past Surgical History:  Procedure Laterality Date  . 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/09/2020   Procedure: AMPUTATION TOE;  Surgeon: Sharlotte Alamo, DPM;  Location: [facility] ORS;  Service: Podiatry;  Laterality: Right;  . BUNIONECTOMY  1989  . CARDIAC CATHETERIZATION    . CARPAL TUNNEL RELEASE Left 2011   ulnar nerve sub muscular at elbow  . CHOLECYSTECTOMY  09/07  . COLONOSCOPY WITH PROPOFOL N/A 03/05/2017   Procedure: COLONOSCOPY WITH PROPOFOL;  Surgeon: Lucilla Lame, MD;  Location: [facility] ENDOSCOPY;  Service: Endoscopy;  Laterality: N/A;  . ELECTROPHYSIOLOGIC STUDY N/A 11/15/2015   Procedure: CARDIOVERSION;  Surgeon: Isaias Cowman, MD;  Location: [facility] ORS;  Service: Cardiovascular;  Laterality: N/A;  . ESOPHAGOGASTRODUODENOSCOPY N/A 04/10/2020   Procedure: ESOPHAGOGASTRODUODENOSCOPY (EGD);  Surgeon: Toledo, Benay Pike, MD;  Location: [facility] ENDOSCOPY;  Service: Gastroenterology;  Laterality: N/A;  . ESOPHAGOGASTRODUODENOSCOPY (EGD) WITH PROPOFOL N/A 03/05/2017   Procedure: ESOPHAGOGASTRODUODENOSCOPY (EGD) WITH PROPOFOL;  Surgeon: Lucilla Lame, MD;  Location: [facility] ENDOSCOPY;  Service: Endoscopy;  Laterality: N/A;  . ESOPHAGOGASTRODUODENOSCOPY (EGD) WITH PROPOFOL N/A 10/17/2017   Procedure: ESOPHAGOGASTRODUODENOSCOPY (EGD) WITH PROPOFOL;  Surgeon: Lollie Sails, MD;  Location: [facility] ENDOSCOPY;  Service: Endoscopy;  Laterality: N/A;  . EYE SURGERY Bilateral 2010   cataract  . HEMORRHOID SURGERY    . KYPHOPLASTY N/A 11/04/2016   Procedure: KYPHOPLASTY T 12;  Surgeon: Hessie Knows, MD;  Location: [facility] ORS;  Service: Orthopedics;  Laterality: N/A;    Prior to Admission medications   Medication Sig Start Date End Date Taking? Authorizing Provider  acetaminophen (TYLENOL) 500 MG tablet Take 500-1,000 mg by mouth 3 (three) times daily as needed for moderate pain or headache.    [provider]  albuterol (VENTOLIN HFA) 108 (90  Base) MCG/ACT inhaler Inhale 2 puffs into the lungs every 6 (six) hours as needed for wheezing or shortness of breath. 02/14/20   Brand Males, MD  azelastine (ASTELIN) 0.1 % nasal spray Place 1 spray into both nostrils 2 (two) times daily. Use in each nostril as directed 05/03/20   Einar Pheasant, MD  budesonide (ENTOCORT EC) 3 MG 24 hr capsule Take 9 mg by mouth daily as needed (colitis flare).  05/31/15   [provider]  calcium-vitamin D (OSCAL WITH D) 500-200 MG-UNIT tablet Take 2 tablets by mouth daily.    [provider]  ELIQUIS 5 MG TABS tablet Take 5 mg by mouth 2 (two) times daily.  05/23/16   [provider]  FIBER PO Take 1 capsule by mouth daily.     [provider]  fluticasone (FLONASE) 50 MCG/ACT nasal spray Place 1 spray into both nostrils daily. 11/24/19   [name], NP  Fluticasone-Umeclidin-Vilant (TRELEGY ELLIPTA) 100-62.5-25 MCG/INH AEPB Inhale 1 puff into the lungs daily. 02/14/20   Brand Males, MD  folic acid (FOLVITE) 1 MG tablet  Take 1 tablet (1 mg total) by mouth daily. Can take any over-the-counter supplement. 04/11/20   [name], MD  furosemide (LASIX) 20 MG tablet Take 20 mg by mouth 2 (two) times daily.    [provider]  gabapentin (NEURONTIN) 100 MG capsule Take 4 capsules (400 mg) daily in the morning and midday. Take 8 capsules (800 mg) daily in the evening. 06/07/20   Einar Pheasant, MD  metoprolol tartrate (LOPRESSOR) 100 MG tablet Take 1 tablet (100 mg total) by mouth 2 (two) times daily. 05/23/20   Einar Pheasant, MD  ondansetron (ZOFRAN ODT) 4 MG disintegrating tablet Take 1 tablet (4 mg total) by mouth every 8 (eight) hours as needed for nausea or vomiting. 07/07/20   [name], MD  pantoprazole (PROTONIX) 40 MG tablet Take 1 tablet (40 mg total) by mouth 2 (two) times daily. 06/30/20   Einar Pheasant, MD  sertraline (ZOLOFT) 50 MG tablet Take 1 tablet (50 mg total) by mouth daily. 06/30/20    Einar Pheasant, MD  triamcinolone ointment (KENALOG) 0.1 %  06/24/19   [provider]  vitamin B-12 (CYANOCOBALAMIN) 1000 MCG tablet Take 1,000 mcg by mouth daily.    [provider]    Allergies Iodinated diagnostic agents  Family History  Problem Relation Age of Onset  . Stroke Mother   . Heart disease Father        MI - 9   . Colon cancer Neg Hx   . Prostate cancer Neg Hx     Social History Social History   Tobacco Use  . Smoking status: Current Some Day Smoker    Packs/day: 2.00    Years: 58.00    Pack years: 116.00    Types: Cigarettes    Start date: 08/19/1957    Last attempt to quit: 11/01/2015    Years since quitting: 4.6  . Smokeless tobacco: Never Used  Vaping Use  . Vaping Use: Never used  Substance Use Topics  . Alcohol use: Yes    Alcohol/week: 42.0 standard drinks    Types: 42 Cans of beer per week    Comment: occas  . Drug use: No    Review of Systems  Constitutional: No fever/chills Eyes: No visual changes. ENT: No sore throat. Cardiovascular: Denies chest pain. Respiratory: Denies shortness of breath. Gastrointestinal: No abdominal pain.  No nausea, no vomiting.  No diarrhea.  No constipation. Genitourinary: Negative for dysuria. Musculoskeletal: Negative for back pain. Skin: Negative for rash. Neurological: Negative for headaches, focal weakness   ____________________________________________   PHYSICAL EXAM:  VITAL SIGNS: ED Triage Vitals [07/07/20 1528]  Enc Vitals Group     BP (!) 171/97     Pulse Rate 64     Resp 20     Temp 97.6 F (36.4 C)     Temp src      SpO2 95 %     Weight 175 lb (79.4 kg)     Height 5\' 10"  (1.778 m)     Head Circumference      Peak Flow      Pain Score 6     Pain Loc      Pain Edu?      Excl. in [place]?     Constitutional: Alert and oriented. Well appearing and in no acute distress. Eyes: Conjunctivae are normal. PERRL. EOMI. Head: Atraumatic except for a scab on the front of his  head from the fall.  Ears TMs are clear bilaterally Nose: No  congestion/rhinnorhea. Mouth/Throat: Mucous membranes are moist.  Oropharynx non-erythematous. Neck: No stridor.   Cardiovascular: Normal rate, regular rhythm. Grossly normal heart sounds.  Good peripheral circulation. Respiratory: Normal respiratory effort.  No retractions. Lungs CTAB. Gastrointestinal: Soft and nontender. No distention. No abdominal bruits.  Musculoskeletal: No lower extremity tenderness nor edema.   Neurologic:  Normal speech and language. No gross focal neurologic deficits are appreciated.  Cranial nerves II through XII are intact of the visual fields were not checked.  Cerebellar finger-nose rapid alternating movements and hands and his total my hands are normal motor strength is 5/5 throughout patient does not report any numbness Skin:  Skin is warm, dry and intact. No rash noted. Psychiatric: Mood and affect are normal. Speech and behavior are normal.  ____________________________________________   LABS (all labs ordered are listed, but only abnormal results are displayed)  Labs Reviewed  BASIC METABOLIC PANEL - Abnormal; Notable for the following components:      Result Value   Sodium 126 (*)    Chloride 91 (*)    Glucose, Bld 105 (*)    All other components within normal limits  CBC - Abnormal; Notable for the following components:   RBC 2.72 (*)    Hemoglobin 9.9 (*)    HCT 30.0 (*)    MCV 110.3 (*)    MCH 36.4 (*)    All other components within normal limits  URINALYSIS, COMPLETE (UACMP) WITH MICROSCOPIC   ____________________________________________  EKG   ____________________________________________  RADIOLOGY Gertha Calkin, personally viewed and evaluated these images (plain radiographs) as part of my medical decision making, as well as reviewing the written report by the radiologist.  ED MD interpretation:  Official radiology report(s): No results  found.  ____________________________________________   PROCEDURES  Procedure(s) performed (including Critical Care):  Procedures   ____________________________________________   INITIAL IMPRESSION / ASSESSMENT AND PLAN / ED COURSE  Patient's hyponatremia is chronic and stable.  His says his urine is dark we will see if we can give him some more fluids.  I do not anticipate needing to admit him.  His headache and nausea symptoms are slowly improving he says.    ----------------------------------------- 5:13 PM on 07/07/2020 -----------------------------------------  Patient is drinking well.  He is no longer nauseated after Zofran.  He needs to take his afternoon antihypertensive.  I will give him some Zofran let him go and have him follow-up with his doctor.          ____________________________________________   FINAL CLINICAL IMPRESSION(S) / ED DIAGNOSES  Final diagnoses:  Nausea  Mild dehydration  Chronic hyponatremia     ED Discharge Orders         Ordered    ondansetron (ZOFRAN ODT) 4 MG disintegrating tablet  Every 8 hours PRN        07/07/20 1714          *Please note:  Troy Lopez was evaluated in Emergency Department on 07/07/2020 for the symptoms described in the history of present illness. He was evaluated in the context of the global COVID-19 pandemic, which necessitated consideration that the patient might be at risk for infection with the SARS-CoV-2 virus that causes COVID-19. Institutional protocols and algorithms that pertain to the evaluation of patients at risk for COVID-19 are in a state of rapid change based on information released by regulatory bodies including the CDC and federal and state organizations. These policies and algorithms were followed during the patient's care in the ED.  Some ED evaluations and interventions may be delayed as a result of limited staffing during and the pandemic.*   Note:  This document was prepared using  Dragon voice recognition software and may include unintentional dictation errors.    [name], MD 07/07/20 [id number]

## 2020-07-07 NOTE — ED Provider Notes (Signed)
[facility]    CSN: [id number] Arrival date & time: 07/07/20  1137      History   Chief Complaint Chief Complaint  Patient presents with   Generalized Body Aches   Nausea    HPI Troy Lopez is a 79 y.o. male.   HPI   79 year old male here for evaluation of headache, decreased appetite, and nausea.  Patient suffered a fall on November 12, was evaluated by his primary care provider on November 17 and referred to the emergency department.  Patient has had 2 falls recently and with the last 1 he struck his head.  Patient is on Eliquis.  ER evaluation was negative for bleed.  Patient was discharged home and has been using extra Tylenol for his headache which has been helping intermittently.  Patient states that he has not been eating much.  This morning he had a piece of chocolate pie for breakfast and last night he had 3 spoonfuls of spaghetti for dinner but that is all he is in the last 2 days.  Patient reports that he drinks somewhere between 32 and 48 ounces of water daily but does not think he has been getting that much.  Patient denies any more falls, visual changes, or vomiting.  His headache is essentially unchanged.  Patient has a history of hyponatremia and on November 17 his sodium was 128.  Patient also reports that he has been constipated and had to take some MiraLAX to help him have bowel movements.  BMP shows a slightly worsening sodium of 127.  BUN/creatinine are normal.  Past Medical History:  Diagnosis Date   Adrenal gland anomaly    enlargement   Anemia    IDA   Anxiety    CAD (coronary artery disease)    Carpal tunnel syndrome    Chronic back pain    Chronic hyponatremia    Colitis    Colonic polyp    COPD (chronic obstructive pulmonary disease) (HCC)    Degenerative disc disease, lumbar    Depression    Diverticulosis    Dyspnea    Gastric AVM    GERD (gastroesophageal reflux disease)    H/O degenerative disc disease     Hypercholesterolemia    Hyperkalemia    Hyperlipidemia    Hypertension    Irritable bowel syndrome    Monoclonal gammopathy    Monoclonal gammopathy    Neuropathy    Personal history of tobacco use, presenting hazards to health 08/17/2015   Sleep apnea    Vertebral compression fracture [facility])     Patient Active Problem List   Diagnosis Date Noted   Multiple skin tears 06/30/2020   Laceration of skin of forearm, left, initial encounter 06/30/2020   Nocturnal hypoxia 06/27/2020   Cellulitis of right foot 04/09/2020   Renal lesion 03/12/2020   Post-nasal drip 11/24/2019   Skin nodule 09/28/2019   Scalp lesion 06/20/2019   Swelling of lower extremity 03/15/2019   Senile osteoporosis 02/24/2019   Neuropathy 01/01/2019   Macrocytic anemia 12/22/2018   Other fatigue 12/22/2018   ILD (interstitial lung disease) ([place]) 10/06/2018   Peripheral edema 06/10/2018   Lumbar radiculopathy (Right L1/2) 05/21/2018   Lumbar degenerative disc disease 05/21/2018   Lumbar foraminal stenosis 05/21/2018   Chronic pain syndrome 05/21/2018   Syncope 03/05/2018   Constipation 10/29/2017   Fall 10/29/2017   Rib pain on right side 10/29/2017   Pulmonary hypertension ([place]) 05/02/2017   Atrial flutter ([place]) 05/02/2017  Thoracic aortic aneurysm ([place]) 04/29/2017   Abnormal feces    Rectal polyp    Chest pain 03/03/2017   Blood in stool    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   Alcohol dependence ([place]) 01/30/2017   History of compression fracture of spine 12/08/2016   Moderate COPD (chronic obstructive pulmonary disease) ([place]) 07/22/2016   COPD exacerbation ([place]) 07/22/2016   Mild depression (HCC) 07/21/2016   Skin lesion 01/21/2016   Nasal congestion 09/28/2015   Sleep difficulties 09/26/2015   Personal history of tobacco use, presenting hazards to health 08/17/2015   Health care maintenance 10/23/2014   Collagenous colitis  09/16/2014   Leg pain 03/20/2014   Hyperlipemia 12/22/2013   Shortness of breath 11/21/2013   Diarrhea 05/11/2013   Headache 05/11/2013   Ulcer 02/02/2013   Chronic back pain 02/02/2013   GERD (gastroesophageal reflux disease) 09/13/2012   Sleep apnea 09/13/2012   Iron deficiency anemia 09/08/2012   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/2012   CAD (coronary artery disease) 09/08/2012   Hypertension 09/08/2012   Hyponatremia 09/08/2012    Past Surgical History:  Procedure Laterality Date   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/09/2020   Procedure: AMPUTATION TOE;  Surgeon: Sharlotte Alamo, DPM;  Location: [facility] ORS;  Service: Podiatry;  Laterality: Right;   BUNIONECTOMY  1989   CARDIAC CATHETERIZATION     CARPAL TUNNEL RELEASE Left 2011   ulnar nerve sub muscular at elbow   CHOLECYSTECTOMY  09/07   COLONOSCOPY WITH PROPOFOL N/A 03/05/2017   Procedure: COLONOSCOPY WITH PROPOFOL;  Surgeon: Lucilla Lame, MD;  Location: [facility] ENDOSCOPY;  Service: Endoscopy;  Laterality: N/A;   ELECTROPHYSIOLOGIC STUDY N/A 11/15/2015   Procedure: CARDIOVERSION;  Surgeon: Isaias Cowman, MD;  Location: [facility] ORS;  Service: Cardiovascular;  Laterality: N/A;   ESOPHAGOGASTRODUODENOSCOPY N/A 04/10/2020   Procedure: ESOPHAGOGASTRODUODENOSCOPY (EGD);  Surgeon: Tol[place], Benay Pike, MD;  Location: [facility] ENDOSCOPY;  Service: Gastroenterology;  Laterality: N/A;   ESOPHAGOGASTRODUODENOSCOPY (EGD) WITH PROPOFOL N/A 03/05/2017   Procedure: ESOPHAGOGASTRODUODENOSCOPY (EGD) WITH PROPOFOL;  Surgeon: Lucilla Lame, MD;  Location: [facility] ENDOSCOPY;  Service: Endoscopy;  Laterality: N/A;   ESOPHAGOGASTRODUODENOSCOPY (EGD) WITH PROPOFOL N/A 10/17/2017   Procedure: ESOPHAGOGASTRODUODENOSCOPY (EGD) WITH PROPOFOL;  Surgeon: Lollie Sails, MD;  Location: [facility] ENDOSCOPY;  Service: Endoscopy;  Laterality: N/A;   EYE SURGERY Bilateral 2010   cataract   HEMORRHOID SURGERY     KYPHOPLASTY N/A 11/04/2016    Procedure: KYPHOPLASTY T 12;  Surgeon: Hessie Knows, MD;  Location: [facility] ORS;  Service: Orthopedics;  Laterality: N/A;       Home Medications    Prior to Admission medications   Medication Sig Start Date End Date Taking? Authorizing Provider  acetaminophen (TYLENOL) 500 MG tablet Take 500-1,000 mg by mouth 3 (three) times daily as needed for moderate pain or headache.    [provider]  albuterol (VENTOLIN HFA) 108 (90 Base) MCG/ACT inhaler Inhale 2 puffs into the lungs every 6 (six) hours as needed for wheezing or shortness of breath. 02/14/20   Brand Males, MD  azelastine (ASTELIN) 0.1 % nasal spray Place 1 spray into both nostrils 2 (two) times daily. Use in each nostril as directed 05/03/20   Einar Pheasant, MD  budesonide (ENTOCORT EC) 3 MG 24 hr capsule Take 9 mg by mouth daily as needed (colitis flare).  05/31/15   [provider]  calcium-vitamin D (OSCAL WITH D) 500-200 MG-UNIT tablet Take 2 tablets by mouth daily.    [provider]  ELIQUIS 5 MG TABS  tablet Take 5 mg by mouth 2 (two) times daily.  05/23/16   [provider]  FIBER PO Take 1 capsule by mouth daily.     [provider]  fluticasone (FLONASE) 50 MCG/ACT nasal spray Place 1 spray into both nostrils daily. 11/24/19   [name], NP  Fluticasone-Umeclidin-Vilant (TRELEGY ELLIPTA) 100-62.5-25 MCG/INH AEPB Inhale 1 puff into the lungs daily. 02/14/20   Brand Males, MD  folic acid (FOLVITE) 1 MG tablet Take 1 tablet (1 mg total) by mouth daily. Can take any over-the-counter supplement. 04/11/20   [name], MD  furosemide (LASIX) 20 MG tablet Take 20 mg by mouth 2 (two) times daily.    [provider]  gabapentin (NEURONTIN) 100 MG capsule Take 4 capsules (400 mg) daily in the morning and midday. Take 8 capsules (800 mg) daily in the evening. 06/07/20   Einar Pheasant, MD  metoprolol tartrate (LOPRESSOR) 100 MG tablet Take 1 tablet (100 mg total) by mouth 2  (two) times daily. 05/23/20   Einar Pheasant, MD  pantoprazole (PROTONIX) 40 MG tablet Take 1 tablet (40 mg total) by mouth 2 (two) times daily. 06/30/20   Einar Pheasant, MD  sertraline (ZOLOFT) 50 MG tablet Take 1 tablet (50 mg total) by mouth daily. 06/30/20   Einar Pheasant, MD  triamcinolone ointment (KENALOG) 0.1 %  06/24/19   [provider]  vitamin B-12 (CYANOCOBALAMIN) 1000 MCG tablet Take 1,000 mcg by mouth daily.    [provider]    Family History Family History  Problem Relation Age of Onset   Stroke Mother    Heart disease Father        MI - 104    Colon cancer Neg Hx    Prostate cancer Neg Hx     Social History Social History   Tobacco Use   Smoking status: Former Smoker    Packs/day: 2.00    Years: 58.00    Pack years: 116.00    Types: Cigarettes    Start date: 08/19/1957    Quit date: 11/01/2015    Years since quitting: 4.6   Smokeless tobacco: Never Used  Vaping Use   Vaping Use: Never used  Substance Use Topics   Alcohol use: Yes    Alcohol/week: 42.0 standard drinks    Types: 42 Cans of beer per week    Comment: occas   Drug use: No     Allergies   Iodinated diagnostic agents   Review of Systems Review of Systems  Constitutional: Positive for appetite change. Negative for activity change, fatigue and fever.  Gastrointestinal: Positive for constipation and nausea. Negative for abdominal pain.  Genitourinary: Negative for decreased urine volume.       Patient reports that he has had decreased urine output and it has become a darker yellow.  Musculoskeletal: Negative for myalgias.  Skin: Positive for color change.       Patient has ecchymosis to his forehead and a quarter size scabbed lesion on the right superior aspect of his forehead.  No erythema or drainage.  Neurological: Positive for syncope and headaches. Negative for dizziness and weakness.  Hematological: Negative.   Psychiatric/Behavioral: Negative.       Physical Exam Triage Vital Signs ED Triage Vitals  Enc Vitals Group     BP 07/07/20 1152 (!) 148/81     Pulse Rate 07/07/20 1152 60     Resp 07/07/20 1152 17     Temp 07/07/20 1152 97.8 F (36.6 C)  Temp Source 07/07/20 1152 Oral     SpO2 07/07/20 1152 100 %     Weight --      Height --      Head Circumference --      Peak Flow --      Pain Score 07/07/20 1149 0     Pain Loc --      Pain Edu? --      Excl. in [place]? --    No data found.  Updated Vital Signs BP (!) 148/81 (BP Location: Left Arm)    Pulse 60    Temp 97.8 F (36.6 C) (Oral)    Resp 17    SpO2 100%   Visual Acuity Right Eye Distance:   Left Eye Distance:   Bilateral Distance:    Right Eye Near:   Left Eye Near:    Bilateral Near:     Physical Exam Vitals and nursing note reviewed.  Constitutional:      Appearance: He is not toxic-appearing or diaphoretic.  HENT:     Head: Normocephalic.     Right Ear: Tympanic membrane, ear canal and external ear normal.     Left Ear: Tympanic membrane, ear canal and external ear normal.     Mouth/Throat:     Mouth: Mucous membranes are moist.     Pharynx: Oropharynx is clear. No oropharyngeal exudate or posterior oropharyngeal erythema.  Eyes:     General: No scleral icterus.    Extraocular Movements: Extraocular movements intact.     Conjunctiva/sclera: Conjunctivae normal.     Pupils: Pupils are equal, round, and reactive to light.  Cardiovascular:     Rate and Rhythm: Normal rate and regular rhythm.     Pulses: Normal pulses.     Heart sounds: Normal heart sounds. No murmur heard.   Pulmonary:     Effort: Pulmonary effort is normal.     Breath sounds: Normal breath sounds. No wheezing, rhonchi or rales.  Musculoskeletal:        General: Normal range of motion.  Skin:    General: Skin is warm and dry.     Capillary Refill: Capillary refill takes less than 2 seconds.     Findings: Bruising present.     Comments: Patient has resolving ecchymosis  on his forehead.  Patient's skin turgor is decreased.  Neurological:     General: No focal deficit present.     Mental Status: He is alert and oriented to person, place, and time.     Cranial Nerves: No cranial nerve deficit.     Sensory: No sensory deficit.  Psychiatric:        Mood and Affect: Mood normal.        Behavior: Behavior normal.        Thought Content: Thought content normal.        Judgment: Judgment normal.      UC Treatments / Results  Labs (all labs ordered are listed, but only abnormal results are displayed) Labs Reviewed  BASIC METABOLIC PANEL - Abnormal; Notable for the following components:      Result Value   Sodium 127 (*)    Chloride 92 (*)    All other components within normal limits  URINALYSIS, COMPLETE (UACMP) WITH MICROSCOPIC    EKG   Radiology CT Head Wo Contrast  Result Date: 07/05/2020 CLINICAL DATA:  Head trauma, moderate/severe. Poly trauma, critical, head/cervical spine injury suspected. Additional history provided: Fall yesterday with loss of consciousness, multiple recent falls,  patient taking Eliquis. EXAM: CT HEAD WITHOUT CONTRAST CT CERVICAL SPINE WITHOUT CONTRAST TECHNIQUE: Multidetector CT imaging of the head and cervical spine was performed following the standard protocol without intravenous contrast. Multiplanar CT image reconstructions of the cervical spine were also generated. COMPARISON:  Head CT 10/30/2017.  Brain MRI 11/10/2014. FINDINGS: CT HEAD FINDINGS Brain: Moderate generalized cerebral atrophy. Advanced ill-defined hypoattenuation within the cerebral white matter is nonspecific, but compatible with chronic small vessel ischemic disease. There is no acute intracranial hemorrhage. No demarcated cortical infarct. No extra-axial fluid collection. No evidence of intracranial mass. No midline shift. Vascular: No hyperdense vessel.  Atherosclerotic calcifications. Skull: Normal. Negative for fracture or focal lesion. Sinuses/Orbits:  Visualized orbits show no acute finding. 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 Small amount of frothy secretions within the right frontal sinus. Minimal ethmoid sinus mucosal thickening. CT CERVICAL SPINE FINDINGS Alignment: 2 mm C3-C4 grade 1 anterolisthesis. Trace C5-C6 grade 1 anterolisthesis. Skull base and vertebrae: The basion-dental and atlanto-dental intervals are maintained.No evidence of acute fracture to the cervical spine. Soft tissues and spinal canal: No prevertebral fluid or swelling. No visible canal hematoma. Disc levels: Cervical spondylosis with multilevel disc space narrowing, posterior disc osteophytes, uncovertebral hypertrophy and facet arthrosis. Disc space narrowing is moderate to moderately advanced at C4-C5, C5-C6 and C6-C7. Upper chest: Emphysema within the imaged lung apices. No visible airspace consolidation or pneumothorax. Other: Incidentally noted cervical ribs. IMPRESSION: CT head: 1. No evidence of acute intracranial abnormality. 2. Moderate cerebral atrophy and advanced chronic small vessel ischemic disease. 3. 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 Small volume frothy secretions are also present within the right frontal sinus suggestive of sinusitis. CT cervical spine: 1. No evidence of acute fracture to the cervical spine. 2. C3-C4 and C5-C6 grade 1 anterolisthesis. 3. Cervical spondylosis as described. 4. Emphysema (ICD10-J43.9). Electronically Signed   By: Kellie Simmering DO   On: 07/05/2020 15:06   CT Cervical Spine Wo Contrast  Result Date: 07/05/2020 CLINICAL DATA:  Head trauma, moderate/severe. Poly trauma, critical, head/cervical spine injury suspected. Additional history provided: Fall yesterday with loss of consciousness, multiple recent falls, patient taking Eliquis. EXAM: CT HEAD WITHOUT CONTRAST CT CERVICAL SPINE WITHOUT CONTRAST TECHNIQUE: Multidetector CT imaging of  the head and cervical spine was performed following the standard protocol without intravenous contrast. Multiplanar CT image reconstructions of the cervical spine were also generated. COMPARISON:  Head CT 10/30/2017.  Brain MRI 11/10/2014. FINDINGS: CT HEAD FINDINGS Brain: Moderate generalized cerebral atrophy. Advanced ill-defined hypoattenuation within the cerebral white matter is nonspecific, but compatible with chronic small vessel ischemic disease. There is no acute intracranial hemorrhage. No demarcated cortical infarct. No extra-axial fluid collection. No evidence of intracranial mass. No midline shift. Vascular: No hyperdense vessel.  Atherosclerotic calcifications. Skull: Normal. Negative for fracture or focal lesion. Sinuses/Orbits: Visualized orbits show no acute finding. 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 Small amount of frothy secretions within the right frontal sinus. Minimal ethmoid sinus mucosal thickening. CT CERVICAL SPINE FINDINGS Alignment: 2 mm C3-C4 grade 1 anterolisthesis. Trace C5-C6 grade 1 anterolisthesis. Skull base and vertebrae: The basion-dental and atlanto-dental intervals are maintained.No evidence of acute fracture to the cervical spine. Soft tissues and spinal canal: No prevertebral fluid or swelling. No visible canal hematoma. Disc levels: Cervical spondylosis with multilevel disc space narrowing, posterior disc osteophytes, uncovertebral  hypertrophy and facet arthrosis. Disc space narrowing is moderate to moderately advanced at C4-C5, C5-C6 and C6-C7. Upper chest: Emphysema within the imaged lung apices. No visible airspace consolidation or pneumothorax. Other: Incidentally noted cervical ribs. IMPRESSION: CT head: 1. No evidence of acute intracranial abnormality. 2. Moderate cerebral atrophy and advanced chronic small vessel ischemic disease. 3. 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 Small volume frothy secretions are also present within the right frontal sinus suggestive of sinusitis. CT cervical spine: 1. No evidence of acute fracture to the cervical spine. 2. C3-C4 and C5-C6 grade 1 anterolisthesis. 3. Cervical spondylosis as described. 4. Emphysema (ICD10-J43.9). Electronically Signed   By: Kellie Simmering DO   On: 07/05/2020 15:06    Procedures Procedures (including critical care time)  Medications Ordered in UC Medications  ondansetron (ZOFRAN-ODT) disintegrating tablet 8 mg (8 mg Oral Given 07/07/20 1246)    Initial Impression / Assessment and Plan / UC Course  I have reviewed the triage vital signs and the nursing notes.  Pertinent labs & imaging results that were available during my care of the patient were reviewed by me and considered in my medical decision making (see chart for details).   Patient of continued headache, decreased appetite, and nausea after sustaining a fall a week ago.  Patient was evaluated in the ER 2 days ago had a sodium of 128 at that time.  Has continued to not eat or drink at home.  He has been using Tylenol for his headache which has been helping some.  Patient called his primary care provider who recommended he be reevaluated in the urgent care for possible worsening hyponatremia and the need for IV fluid replacement.  Will check BMP and UA, give patient Zofran for nausea, and give IV fluid bolus.  Unable to obtain IV after multiple sticks.  Nursing staff attempting to collect blood for chemistry analysis.  EKG shows sinus rhythm with right bundle branch block.  There are no changes to the EKG in comparison to the July 05, 2020 EKG.   Final Clinical Impressions(s) / UC Diagnoses   Final diagnoses:  Hyponatremia  Dehydration     Discharge Instructions     Your sodium has worsened and your clinical presentation speaks to dehydration.  Since we are unable to obtain IV access here in the urgent care I am going to  recommend that you go to the ER for IV placement and fluids.    ED Prescriptions    None     PDMP not reviewed this encounter.   Margarette Canada, NP 07/07/20 1414

## 2020-07-07 NOTE — ED Notes (Signed)
Patient is being discharged from the Urgent Care and sent to the [facility] Emergency Department via private vehicle . Per Margarette Canada, NP, patient is in need of higher level of care due to dehydration and low sodium. Patient is aware and verbalizes understanding of plan of care.  Vitals:   07/07/20 1152  BP: (!) 148/81  Pulse: 60  Resp: 17  Temp: 97.8 F (36.6 C)  SpO2: 100%

## 2020-07-07 NOTE — Telephone Encounter (Signed)
If he is having continued headache, not eating, etc - he does need to be reevaluated.  May need fluids, etc.

## 2020-07-07 NOTE — ED Triage Notes (Signed)
Pt is here with all over body aches that started after a fall on 07/04/2020, pt states since he has a loss of appetite and mild nausea. Pt was seen on 07/05/2020 for this as well.

## 2020-07-07 NOTE — Telephone Encounter (Signed)
See phone note

## 2020-07-07 NOTE — Discharge Instructions (Addendum)
Your sodium has worsened and your clinical presentation speaks to dehydration.  Since we are unable to obtain IV access here in the urgent care I am going to recommend that you go to the ER for IV placement and fluids.

## 2020-07-07 NOTE — Discharge Instructions (Addendum)
Please use the Zofran melt on your tongue wafers 1 3 times a day if needed for nausea.  Please continue to drink.  Please continue to follow-up with your regular doctor to keep an eye on your low blood sodium.  Your blood pressure is a little high right now I expect that is because you are here in the emergency room and have not had a chance to get your metoprolol this evening.  Your regular doctor might want to keep an eye on that as well.

## 2020-07-07 NOTE — Telephone Encounter (Signed)
He is not feeling well. Headaches all the time. Staying nauseated. Not sleeping. Does not have any appetite. Diarrhea has stopped but he is not really eating. Urine is dark. He is taking his lasix once daily and metoprolol twice a day. Did not have BP readings. He has an appt with cardiology in January. Wound clinic called to schedule him but he has not returned their call. He says he has not felt like going anywhere. Pt does not have a follow up scheduled in the near future but given his symptoms was not sure if a virtual would be appropriate.

## 2020-07-07 NOTE — ED Notes (Signed)
Patient declined IV, fluids and PO zofran. States he is ready for DC.

## 2020-07-07 NOTE — Telephone Encounter (Signed)
Need to call and see how he is doing.  Recent evaluation in ER.  Recent syncopal episodes/falls.  Need to know how his blood pressure is doing and if he is taking lasix and metoprolol.  If so, need to know doses taking.  Also, he needs a f/u with cardiology (sees Dr Saralyn Pilar) - given the recurring syncopal episodes.  See if he has f/u scheduled and if not, is he agreeable to have a virtual or in office visit.  Still having diarrhea, nausea, headache?  Also, has he been scheduled to see wound clinic?

## 2020-07-08 ENCOUNTER — Encounter: Payer: Self-pay | Admitting: Internal Medicine

## 2020-07-10 ENCOUNTER — Other Ambulatory Visit: Payer: Self-pay

## 2020-07-10 ENCOUNTER — Inpatient Hospital Stay
Admission: EM | Admit: 2020-07-10 | Discharge: 2020-07-11 | DRG: 378 | Disposition: A | Discharge status: Home / Self Care | Payer: PPO | Attending: Internal Medicine | Admitting: Internal Medicine

## 2020-07-10 ENCOUNTER — Encounter: Payer: Self-pay | Admitting: Emergency Medicine

## 2020-07-10 DIAGNOSIS — Z20822 Contact with and (suspected) exposure to covid-19: Secondary | ICD-10-CM | POA: Diagnosis present

## 2020-07-10 DIAGNOSIS — K449 Diaphragmatic hernia without obstruction or gangrene: Secondary | ICD-10-CM | POA: Diagnosis present

## 2020-07-10 DIAGNOSIS — Z8719 Personal history of other diseases of the digestive system: Secondary | ICD-10-CM

## 2020-07-10 DIAGNOSIS — E7800 Pure hypercholesterolemia, unspecified: Secondary | ICD-10-CM | POA: Diagnosis present

## 2020-07-10 DIAGNOSIS — E870 Hyperosmolality and hypernatremia: Secondary | ICD-10-CM | POA: Diagnosis present

## 2020-07-10 DIAGNOSIS — D62 Acute posthemorrhagic anemia: Secondary | ICD-10-CM | POA: Diagnosis present

## 2020-07-10 DIAGNOSIS — I10 Essential (primary) hypertension: Secondary | ICD-10-CM | POA: Diagnosis present

## 2020-07-10 DIAGNOSIS — E538 Deficiency of other specified B group vitamins: Secondary | ICD-10-CM

## 2020-07-10 DIAGNOSIS — F32A Depression, unspecified: Secondary | ICD-10-CM | POA: Diagnosis present

## 2020-07-10 DIAGNOSIS — Z9181 History of falling: Secondary | ICD-10-CM | POA: Diagnosis not present

## 2020-07-10 DIAGNOSIS — F419 Anxiety disorder, unspecified: Secondary | ICD-10-CM | POA: Diagnosis present

## 2020-07-10 DIAGNOSIS — E785 Hyperlipidemia, unspecified: Secondary | ICD-10-CM | POA: Diagnosis present

## 2020-07-10 DIAGNOSIS — K921 Melena: Secondary | ICD-10-CM | POA: Diagnosis not present

## 2020-07-10 DIAGNOSIS — I2510 Atherosclerotic heart disease of native coronary artery without angina pectoris: Secondary | ICD-10-CM | POA: Diagnosis present

## 2020-07-10 DIAGNOSIS — E8809 Other disorders of plasma-protein metabolism, not elsewhere classified: Secondary | ICD-10-CM

## 2020-07-10 DIAGNOSIS — R519 Headache, unspecified: Secondary | ICD-10-CM | POA: Diagnosis present

## 2020-07-10 DIAGNOSIS — E871 Hypo-osmolality and hyponatremia: Secondary | ICD-10-CM | POA: Diagnosis present

## 2020-07-10 DIAGNOSIS — M549 Dorsalgia, unspecified: Secondary | ICD-10-CM | POA: Diagnosis present

## 2020-07-10 DIAGNOSIS — K31811 Angiodysplasia of stomach and duodenum with bleeding: Secondary | ICD-10-CM | POA: Diagnosis present

## 2020-07-10 DIAGNOSIS — K922 Gastrointestinal hemorrhage, unspecified: Principal | ICD-10-CM | POA: Diagnosis present

## 2020-07-10 DIAGNOSIS — K31819 Angiodysplasia of stomach and duodenum without bleeding: Secondary | ICD-10-CM | POA: Diagnosis not present

## 2020-07-10 DIAGNOSIS — Z89421 Acquired absence of other right toe(s): Secondary | ICD-10-CM

## 2020-07-10 DIAGNOSIS — I4892 Unspecified atrial flutter: Secondary | ICD-10-CM | POA: Diagnosis present

## 2020-07-10 DIAGNOSIS — D472 Monoclonal gammopathy: Secondary | ICD-10-CM | POA: Diagnosis present

## 2020-07-10 DIAGNOSIS — Z8679 Personal history of other diseases of the circulatory system: Secondary | ICD-10-CM | POA: Diagnosis not present

## 2020-07-10 DIAGNOSIS — K219 Gastro-esophageal reflux disease without esophagitis: Secondary | ICD-10-CM | POA: Diagnosis present

## 2020-07-10 DIAGNOSIS — I951 Orthostatic hypotension: Secondary | ICD-10-CM

## 2020-07-10 DIAGNOSIS — R531 Weakness: Secondary | ICD-10-CM | POA: Diagnosis present

## 2020-07-10 DIAGNOSIS — Z8249 Family history of ischemic heart disease and other diseases of the circulatory system: Secondary | ICD-10-CM

## 2020-07-10 DIAGNOSIS — G8929 Other chronic pain: Secondary | ICD-10-CM | POA: Diagnosis present

## 2020-07-10 DIAGNOSIS — Z87891 Personal history of nicotine dependence: Secondary | ICD-10-CM

## 2020-07-10 DIAGNOSIS — R001 Bradycardia, unspecified: Secondary | ICD-10-CM | POA: Diagnosis not present

## 2020-07-10 DIAGNOSIS — G4730 Sleep apnea, unspecified: Secondary | ICD-10-CM | POA: Diagnosis present

## 2020-07-10 DIAGNOSIS — Z823 Family history of stroke: Secondary | ICD-10-CM

## 2020-07-10 DIAGNOSIS — G629 Polyneuropathy, unspecified: Secondary | ICD-10-CM | POA: Diagnosis present

## 2020-07-10 DIAGNOSIS — J449 Chronic obstructive pulmonary disease, unspecified: Secondary | ICD-10-CM | POA: Diagnosis present

## 2020-07-10 LAB — COMPREHENSIVE METABOLIC PANEL
ALT: 10 U/L (ref 0–44)
AST: 17 U/L (ref 15–41)
Albumin: 3.3 g/dL — ABNORMAL LOW (ref 3.5–5.0)
Alkaline Phosphatase: 67 U/L (ref 38–126)
Anion gap: 6 (ref 5–15)
BUN: 12 mg/dL (ref 8–23)
CO2: 24 mmol/L (ref 22–32)
Calcium: 8.6 mg/dL — ABNORMAL LOW (ref 8.9–10.3)
Chloride: 92 mmol/L — ABNORMAL LOW (ref 98–111)
Creatinine, Ser: 1.05 mg/dL (ref 0.61–1.24)
GFR, Estimated: >60 mL/min (ref >60)
Glucose, Bld: 92 mg/dL (ref 70–99)
Potassium: 3.9 mmol/L (ref 3.5–5.1)
Sodium: 122 mmol/L — ABNORMAL LOW (ref 135–145)
Total Bilirubin: 0.6 mg/dL (ref 0.3–1.2)
Total Protein: 8 g/dL (ref 6.5–8.1)

## 2020-07-10 LAB — CBC
HCT: 24.9 % — ABNORMAL LOW (ref 39.0–52.0)
Hemoglobin: 8.3 g/dL — ABNORMAL LOW (ref 13.0–17.0)
MCH: 36.6 pg — ABNORMAL HIGH (ref 26.0–34.0)
MCHC: 33.3 g/dL (ref 30.0–36.0)
MCV: 109.7 fL — ABNORMAL HIGH (ref 80.0–100.0)
Platelets: 336 10*3/uL (ref 150–400)
RBC: 2.27 MIL/uL — ABNORMAL LOW (ref 4.22–5.81)
RDW: 14.9 % (ref 11.5–15.5)
WBC: 7.1 10*3/uL (ref 4.0–10.5)
nRBC: 0 % (ref 0.0–0.2)

## 2020-07-10 LAB — RESP PANEL BY RT-PCR (FLU A&B, COVID) ARPGX2
Influenza A by PCR: NEGATIVE
Influenza B by PCR: NEGATIVE
SARS Coronavirus 2 by RT PCR: NEGATIVE

## 2020-07-10 LAB — CBC WITH DIFFERENTIAL/PLATELET
Abs Immature Granulocytes: 0.04 10*3/uL (ref 0.00–0.07)
Basophils Absolute: 0.1 10*3/uL (ref 0.0–0.1)
Basophils Relative: 1 %
Eosinophils Absolute: 0 10*3/uL (ref 0.0–0.5)
Eosinophils Relative: 0 %
HCT: 26 % — ABNORMAL LOW (ref 39.0–52.0)
Hemoglobin: 8.8 g/dL — ABNORMAL LOW (ref 13.0–17.0)
Immature Granulocytes: 1 %
Lymphocytes Relative: 17 %
Lymphs Abs: 1.3 10*3/uL (ref 0.7–4.0)
MCH: 36.7 pg — ABNORMAL HIGH (ref 26.0–34.0)
MCHC: 33.8 g/dL (ref 30.0–36.0)
MCV: 108.3 fL — ABNORMAL HIGH (ref 80.0–100.0)
Monocytes Absolute: 0.9 10*3/uL (ref 0.1–1.0)
Monocytes Relative: 11 %
Neutro Abs: 5.6 10*3/uL (ref 1.7–7.7)
Neutrophils Relative %: 70 %
Platelets: 349 10*3/uL (ref 150–400)
RBC: 2.4 MIL/uL — ABNORMAL LOW (ref 4.22–5.81)
RDW: 14.7 % (ref 11.5–15.5)
WBC: 7.9 10*3/uL (ref 4.0–10.5)
nRBC: 0 % (ref 0.0–0.2)

## 2020-07-10 LAB — PROTIME-INR
INR: 1.1 (ref 0.8–1.2)
Prothrombin Time: 14.1 seconds (ref 11.4–15.2)

## 2020-07-10 LAB — TYPE AND SCREEN
ABO/RH(D): A POS
Antibody Screen: NEGATIVE

## 2020-07-10 MED ORDER — SODIUM CHLORIDE 0.9% FLUSH: 10.0000 mL | INTRAVENOUS | Status: DC | PRN

## 2020-07-10 MED ORDER — ACETAMINOPHEN 325 MG PO TABS
650.0000 mg | ORAL_TABLET | Freq: Four times a day (QID) | ORAL | Status: DC | PRN
  Administered 2020-07-10 – 2020-07-11 (×2): 650 mg via ORAL
  Filled 2020-07-10 (×2): qty 2

## 2020-07-10 MED ORDER — SODIUM CHLORIDE 0.9 % IV BOLUS
1000.0000 mL | Freq: Once | INTRAVENOUS | Status: AC
  Administered 2020-07-10: 1000 mL via INTRAVENOUS

## 2020-07-10 MED ORDER — SODIUM CHLORIDE 0.9 % IV SOLN
8.0000 mg/h | INTRAVENOUS | Status: DC
  Administered 2020-07-10 – 2020-07-11 (×2): 8 mg/h via INTRAVENOUS
  Filled 2020-07-10 (×2): qty 80

## 2020-07-10 MED ORDER — SODIUM CHLORIDE 0.9 % IV SOLN
80.0000 mg | Freq: Once | INTRAVENOUS | Status: AC
  Administered 2020-07-10: 80 mg via INTRAVENOUS
  Filled 2020-07-10: qty 80

## 2020-07-10 MED ORDER — METOCLOPRAMIDE HCL 5 MG/ML IJ SOLN
10.0000 mg | Freq: Once | INTRAMUSCULAR | Status: AC
  Administered 2020-07-10: 10 mg via INTRAVENOUS
  Filled 2020-07-10: qty 2

## 2020-07-10 MED ORDER — SODIUM CHLORIDE 0.9% FLUSH
10.0000 mL | Freq: Two times a day (BID) | INTRAVENOUS | Status: DC
  Administered 2020-07-11 (×2): 10 mL

## 2020-07-10 NOTE — H&P (Signed)
History and Physical  Troy Lopez PFX:[id number] DOB: 1941-04-08 DOA: 07/10/2020  Referring physician: Arta Silence, MD PCP: Einar Pheasant, MD  Patient coming from: Home  Chief Complaint: Dark stool  HPI: Troy Lopez is a 79 y.o. male with medical history significant for CAD, paroxysmal a flutter on Eliquis, GERD, COPD who presents to the emergency department due to 3 to 4-day onset of dark tarry stool.  He denies taking iron pills, patient also denies any bright red blood per rectum.  He endorsed having some nausea, but denies vomiting, abdominal pain, chest pain, shortness of breath.  He complained of having several falls within last few weeks (seen in the ED on 11/19 due to fall sustained at home where he hit his head on 11/17, CT of head done at that time was negative) and complained of shortness of breath (though O2 sat was 97% on room air).  ED Course:  In the emergency department, he was hemodynamically stable, BP was 139/77 at bedside.  Work-up in the ED showed macrocytic anemia, hyponatremia, hypoalbuminemia.  He was started on IV Protonix drip, Reglan was given and patient was started on IV NS due to hypernatremia.  Hospitalist was asked to admit patient for further evaluation and management.  Review of Systems: Constitutional: Negative for chills and fever.  HENT: Negative for ear pain and sore throat.   Eyes: Negative for pain and visual disturbance.  Respiratory: Negative for cough, chest tightness and shortness of breath.   Cardiovascular: Negative for chest pain and palpitations.  Gastrointestinal: Positive for nausea and dark tarry stool.  Negative for abdominal pain and vomiting.  Endocrine: Negative for polyphagia and polyuria.  Genitourinary: Negative for decreased urine volume, dysuria, enuresis Musculoskeletal: Negative for arthralgias and back pain.  Skin: Negative for color change and rash.  Allergic/Immunologic: Negative for immunocompromised state.    Neurological: Negative for tremors, syncope, speech difficulty, weakness, light-headedness and headaches.  Hematological: Does not bruise/bleed easily.  All other systems reviewed and are negative  Past Medical History:  Diagnosis Date  . Adrenal gland anomaly    enlargement  . Anemia    IDA  . Anxiety   . CAD (coronary artery disease)   . Carpal tunnel syndrome   . Chronic back pain   . Chronic hyponatremia   . Colitis   . Colonic polyp   . COPD (chronic obstructive pulmonary disease) ([place])   . Degenerative disc disease, lumbar   . Depression   . Diverticulosis   . Dyspnea   . Gastric AVM   . GERD (gastroesophageal reflux disease)   . H/O degenerative disc disease   . Hypercholesterolemia   . Hyperkalemia   . Hyperlipidemia   . Hypertension   . Irritable bowel syndrome   . Monoclonal gammopathy   . Monoclonal gammopathy   . Neuropathy   . Personal history of tobacco use, presenting hazards to health 08/17/2015  . Sleep apnea   . Vertebral compression fracture [facility])    Past Surgical History:  Procedure Laterality Date  . 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/09/2020   Procedure: AMPUTATION TOE;  Surgeon: Sharlotte Alamo, DPM;  Location: [facility] ORS;  Service: Podiatry;  Laterality: Right;  . BUNIONECTOMY  1989  . CARDIAC CATHETERIZATION    . CARPAL TUNNEL RELEASE Left 2011   ulnar nerve sub muscular at elbow  . CHOLECYSTECTOMY  09/07  . COLONOSCOPY WITH PROPOFOL N/A 03/05/2017   Procedure: COLONOSCOPY WITH PROPOFOL;  Surgeon: Lucilla Lame, MD;  Location: [facility] ENDOSCOPY;  Service: Endoscopy;  Laterality: N/A;  . ELECTROPHYSIOLOGIC STUDY N/A 11/15/2015   Procedure: CARDIOVERSION;  Surgeon: Isaias Cowman, MD;  Location: [facility] ORS;  Service: Cardiovascular;  Laterality: N/A;  . ESOPHAGOGASTRODUODENOSCOPY N/A 04/10/2020   Procedure: ESOPHAGOGASTRODUODENOSCOPY (EGD);  Surgeon: Toledo, Benay Pike, MD;  Location: [facility] ENDOSCOPY;  Service: Gastroenterology;  Laterality: N/A;  .  ESOPHAGOGASTRODUODENOSCOPY (EGD) WITH PROPOFOL N/A 03/05/2017   Procedure: ESOPHAGOGASTRODUODENOSCOPY (EGD) WITH PROPOFOL;  Surgeon: Lucilla Lame, MD;  Location: [facility] ENDOSCOPY;  Service: Endoscopy;  Laterality: N/A;  . ESOPHAGOGASTRODUODENOSCOPY (EGD) WITH PROPOFOL N/A 10/17/2017   Procedure: ESOPHAGOGASTRODUODENOSCOPY (EGD) WITH PROPOFOL;  Surgeon: Lollie Sails, MD;  Location: [facility] ENDOSCOPY;  Service: Endoscopy;  Laterality: N/A;  . EYE SURGERY Bilateral 2010   cataract  . HEMORRHOID SURGERY    . KYPHOPLASTY N/A 11/04/2016   Procedure: KYPHOPLASTY T 12;  Surgeon: Hessie Knows, MD;  Location: [facility] ORS;  Service: Orthopedics;  Laterality: N/A;    Social History:  reports that he quit smoking about 4 years ago. His smoking use included cigarettes. He started smoking about 62 years ago. He has a 116.00 pack-year smoking history. He has never used smokeless tobacco. He reports current alcohol use of about 42.0 standard drinks of alcohol per week. He reports that he does not use drugs.   Allergies  Allergen Reactions  . Iodinated Diagnostic Agents Other (See Comments)    Contraindication secondary to IGMgammopathy/Waldren's syndrome   Not to be given due to Waldenstrom's syndrome, told it could affect kidney function    Family History  Problem Relation Age of Onset  . Stroke Mother   . Heart disease Father        MI - 68   . Colon cancer Neg Hx   . Prostate cancer Neg Hx     Prior to Admission medications   Medication Sig Start Date End Date Taking? Authorizing Provider  acetaminophen (TYLENOL) 500 MG tablet Take 500-1,000 mg by mouth 3 (three) times daily as needed for moderate pain or headache.    [provider]  albuterol (VENTOLIN HFA) 108 (90 Base) MCG/ACT inhaler Inhale 2 puffs into the lungs every 6 (six) hours as needed for wheezing or shortness of breath. 02/14/20   Brand Males, MD  azelastine (ASTELIN) 0.1 % nasal spray Place 1 spray into both nostrils 2  (two) times daily. Use in each nostril as directed 05/03/20   Einar Pheasant, MD  budesonide (ENTOCORT EC) 3 MG 24 hr capsule Take 9 mg by mouth daily as needed (colitis flare).  05/31/15   [provider]  calcium-vitamin D (OSCAL WITH D) 500-200 MG-UNIT tablet Take 2 tablets by mouth daily.    [provider]  ELIQUIS 5 MG TABS tablet Take 5 mg by mouth 2 (two) times daily.  05/23/16   [provider]  FIBER PO Take 1 capsule by mouth daily.     [provider]  fluticasone (FLONASE) 50 MCG/ACT nasal spray Place 1 spray into both nostrils daily. 11/24/19   [name], NP  Fluticasone-Umeclidin-Vilant (TRELEGY ELLIPTA) 100-62.5-25 MCG/INH AEPB Inhale 1 puff into the lungs daily. 02/14/20   Brand Males, MD  folic acid (FOLVITE) 1 MG tablet Take 1 tablet (1 mg total) by mouth daily. Can take any over-the-counter supplement. 04/11/20   [name], MD  furosemide (LASIX) 20 MG tablet Take 20 mg by mouth 2 (two) times daily.    [provider]  gabapentin (NEURONTIN) 100 MG capsule Take 4 capsules (400 mg) daily  in the morning and midday. Take 8 capsules (800 mg) daily in the evening. 06/07/20   Einar Pheasant, MD  metoprolol tartrate (LOPRESSOR) 100 MG tablet Take 1 tablet (100 mg total) by mouth 2 (two) times daily. 05/23/20   Einar Pheasant, MD  ondansetron (ZOFRAN ODT) 4 MG disintegrating tablet Take 1 tablet (4 mg total) by mouth every 8 (eight) hours as needed for nausea or vomiting. 07/07/20   [name], MD  pantoprazole (PROTONIX) 40 MG tablet Take 1 tablet (40 mg total) by mouth 2 (two) times daily. 06/30/20   Einar Pheasant, MD  sertraline (ZOLOFT) 50 MG tablet Take 1 tablet (50 mg total) by mouth daily. 06/30/20   Einar Pheasant, MD  triamcinolone ointment (KENALOG) 0.1 %  06/24/19   [provider]  vitamin B-12 (CYANOCOBALAMIN) 1000 MCG tablet Take 1,000 mcg by mouth daily.    [provider]    Physical  Exam: BP (!) 186/89 (BP Location: Left Arm) Comment: see MAR  Pulse 69   Temp (!) 97.4 F (36.3 C) (Oral)   Resp 18   Ht 5\' 10"  (1.778 m)   Wt 77.1 kg   SpO2 96%   BMI 24.39 kg/m   . General: 79 y.o. year-old male well developed well nourished in no acute distress.  Alert and oriented x3. Marland Kitchen HEENT: NCAT, EOMI . Neck: Supple, trachea medial . Cardiovascular: Regular rate and rhythm with no rubs or gallops.  No thyromegaly or JVD noted.  No lower extremity edema. 2/4 pulses in all 4 extremities. Marland Kitchen Respiratory: Clear to auscultation with no wheezes or rales. Good inspiratory effort. . Abdomen: Soft nontender nondistended with normal bowel sounds x4 quadrants. . Muskuloskeletal: No cyanosis, clubbing or edema noted bilaterally . Neuro: CN II-XII intact, strength, sensation, reflexes . Skin: No ulcerative lesions noted or rashes . Psychiatry: Judgement and insight appear normal. Mood is appropriate for condition and setting          Labs on Admission:  Basic Metabolic Panel: Recent Labs  Lab 07/05/20 1400 07/07/20 1336 07/07/20 1536 07/10/20 1042  NA 128* 127* 126* 122*  K 4.0 4.6 4.0 3.9  CL 90* 92* 91* 92*  CO2 26 23 25 24   GLUCOSE 96 83 105* 92  BUN 17 17 16 12   CREATININE 1.08 0.99 1.04 1.05  CALCIUM 9.7 9.2 9.4 8.6*   Liver Function Tests: Recent Labs  Lab 07/10/20 1042  AST 17  ALT 10  ALKPHOS 67  BILITOT 0.6  PROT 8.0  ALBUMIN 3.3*   No results for input(s): LIPASE, AMYLASE in the last 168 hours. No results for input(s): AMMONIA in the last 168 hours. CBC: Recent Labs  Lab 07/05/20 1400 07/07/20 1536 07/10/20 1042 07/10/20 2210  WBC 9.2 8.3 7.1 7.9  NEUTROABS  --   --   --  5.6  HGB 10.6* 9.9* 8.3* 8.8*  HCT 31.6* 30.0* 24.9* 26.0*  MCV 110.5* 110.3* 109.7* 108.3*  PLT 366 349 336 349   Cardiac Enzymes: No results for input(s): CKTOTAL, CKMB, CKMBINDEX, TROPONINI in the last 168 hours.  BNP (last 3 results) Recent Labs    12/06/19 1253  01/06/20 1328  BNP 519.0* 426.8*    ProBNP (last 3 results) No results for input(s): PROBNP in the last 8760 hours.  CBG: No results for input(s): GLUCAP in the last 168 hours.  Radiological Exams on Admission: No results found.  EKG: I independently viewed the EKG done and my findings are as followed: Sinus  bradycardia at rate of 59 bpm with RBBB  Assessment/Plan Present on Admission: . Acute blood loss anemia . Hyponatremia . GERD (gastroesophageal reflux disease) . Headache . Hypertension . CAD (coronary artery disease)  Principal Problem:   Acute blood loss anemia Active Problems:   CAD (coronary artery disease)   Hypertension   Hyponatremia   GERD (gastroesophageal reflux disease)   Headache   GI bleed   History of atrial flutter   Hypoalbuminemia   Orthostatic hypotension   Vitamin B12 deficiency  Acute blood loss anemia/GI bleed H/H= 8.3/24.9, this was 10.6/31.6 on 07/05/2020 FOBT pending  Type and crossmatch was done in the ED-A+ He was started on IV Protonix drip Gastroenterologist will be consulted in the morning Patient will be kept n.p.o. at midnight in anticipation for possible endoscopic procedure in the morning  Headache Continue Tylenol 650 mg every 6 hours as needed  Hyponatremia/Orthostatic hypotension Na 122; orthostatic BP was positive Continue gentle hydration Continue to monitor sodium with serial BMPs Urine osmolality, serum osmolality and urine sodium will be checked  Hypoalbuminemia possibly secondary to mild protein calorie malnutrition Albumin 3.3, protein supplement will be provided  Essential hypertension (uncontrolled) Continue IV hydralazine for SBP > 170 Continue metoprolol when patient resumes oral intake  GERD Continue Protonix drip  History of atrial flutter Continue home metoprolol Eliquis will be held at this time due to possible GI bleed  COPD not in acute exacerbation Continue home Ventolin and  Trelegy  Vitamin B12 deficiency MCV 109.7; continue home vitamin B12 when patient resumes oral intake  DVT prophylaxis: SCDs  Code Status: Full code  Family Communication: None at bedside  Disposition Plan:  Patient is from:                        home Anticipated DC to:                   home Anticipated DC date:                1 day Anticipated DC barriers:          Patient is unstable for discharge at this time due to acute blood loss anemia requiring gastroenterology evaluation and management.   Consults called: Gastroenterology  Admission status: Observation    Bernadette Hoit MD [facility]  07/11/2020, 1:21 AM

## 2020-07-10 NOTE — Telephone Encounter (Signed)
Pt in ED at this time 

## 2020-07-10 NOTE — ED Notes (Signed)
Orthostatics:  Laying: 181/96 65 Sitting: 182/95 68 Standing: 156/78 70  Asymptomatic.

## 2020-07-10 NOTE — ED Provider Notes (Signed)
[facility] Emergency Department Provider Note ____________________________________________   First MD Initiated Contact with Patient 07/10/20 1725     (approximate)  I have reviewed the triage vital signs and the nursing notes.   HISTORY  Chief Complaint Melena    HPI EAN GETTEL is a 79 y.o. male with PMH as noted below including a history of gastric AVM who presents with melena over the last 3 to 4 days.  He describes it as dark, tarry stool.  He has not noted any bright red blood in the stool.  He reports some associated nausea recently although this has improved.  He denies any vomiting or abdominal pain.  Past Medical History:  Diagnosis Date  . Adrenal gland anomaly    enlargement  . Anemia    IDA  . Anxiety   . CAD (coronary artery disease)   . Carpal tunnel syndrome   . Chronic back pain   . Chronic hyponatremia   . Colitis   . Colonic polyp   . COPD (chronic obstructive pulmonary disease) ([place])   . Degenerative disc disease, lumbar   . Depression   . Diverticulosis   . Dyspnea   . Gastric AVM   . GERD (gastroesophageal reflux disease)   . H/O degenerative disc disease   . Hypercholesterolemia   . Hyperkalemia   . Hyperlipidemia   . Hypertension   . Irritable bowel syndrome   . Monoclonal gammopathy   . Monoclonal gammopathy   . Neuropathy   . Personal history of tobacco use, presenting hazards to health 08/17/2015  . Sleep apnea   . Vertebral compression fracture [facility])     Patient Active Problem List   Diagnosis Date Noted  . Acute blood loss anemia 07/10/2020  . Multiple skin tears 06/30/2020  . Laceration of skin of forearm, left, initial encounter 06/30/2020  . Nocturnal hypoxia 06/27/2020  . Cellulitis of right foot 04/09/2020  . Renal lesion 03/12/2020  . Post-nasal drip 11/24/2019  . Skin nodule 09/28/2019  . Scalp lesion 06/20/2019  . Swelling of lower extremity 03/15/2019  . Senile osteoporosis 02/24/2019   . Neuropathy 01/01/2019  . Macrocytic anemia 12/22/2018  . Other fatigue 12/22/2018  . ILD (interstitial lung disease) ([place]) 10/06/2018  . Peripheral edema 06/10/2018  . Lumbar radiculopathy (Right L1/2) 05/21/2018  . Lumbar degenerative disc disease 05/21/2018  . Lumbar foraminal stenosis 05/21/2018  . Chronic pain syndrome 05/21/2018  . Syncope 03/05/2018  . Constipation 10/29/2017  . Fall 10/29/2017  . Rib pain on right side 10/29/2017  . Pulmonary hypertension ([place]) 05/02/2017  . Atrial flutter ([place]) 05/02/2017  . Thoracic aortic aneurysm ([place]) 04/29/2017  . Abnormal feces   . Rectal polyp   . Chest pain 03/03/2017  . Blood in stool   . 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 Alcohol dependence ([place]) 01/30/2017  . History of compression fracture of spine 12/08/2016  . Moderate COPD (chronic obstructive pulmonary disease) ([place]) 07/22/2016  . COPD exacerbation ([place]) 07/22/2016  . Mild depression ([place]) 07/21/2016  . Skin lesion 01/21/2016  . Nasal congestion 09/28/2015  . Sleep difficulties 09/26/2015  . Personal history of tobacco use, presenting hazards to health 08/17/2015  . Health care maintenance 10/23/2014  . Collagenous colitis 09/16/2014  . Leg pain 03/20/2014  . Hyperlipemia 12/22/2013  . Shortness of breath 11/21/2013  . Diarrhea 05/11/2013  . Headache 05/11/2013  . Ulcer 02/02/2013  . Chronic back pain 02/02/2013  . GERD (gastroesophageal reflux disease) 09/13/2012  .  Sleep apnea 09/13/2012  . Iron deficiency anemia 09/08/2012  . 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 CAD (coronary artery disease) 09/08/2012  . Hypertension 09/08/2012  . Hyponatremia 09/08/2012    Past Surgical History:  Procedure Laterality Date  . 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/09/2020   Procedure: AMPUTATION TOE;  Surgeon: Sharlotte Alamo, DPM;  Location: [facility] ORS;  Service: Podiatry;  Laterality: Right;  . BUNIONECTOMY  1989  . CARDIAC CATHETERIZATION     . CARPAL TUNNEL RELEASE Left 2011   ulnar nerve sub muscular at elbow  . CHOLECYSTECTOMY  09/07  . COLONOSCOPY WITH PROPOFOL N/A 03/05/2017   Procedure: COLONOSCOPY WITH PROPOFOL;  Surgeon: Lucilla Lame, MD;  Location: [facility] ENDOSCOPY;  Service: Endoscopy;  Laterality: N/A;  . ELECTROPHYSIOLOGIC STUDY N/A 11/15/2015   Procedure: CARDIOVERSION;  Surgeon: Isaias Cowman, MD;  Location: [facility] ORS;  Service: Cardiovascular;  Laterality: N/A;  . ESOPHAGOGASTRODUODENOSCOPY N/A 04/10/2020   Procedure: ESOPHAGOGASTRODUODENOSCOPY (EGD);  Surgeon: Tol[place], Benay Pike, MD;  Location: [facility] ENDOSCOPY;  Service: Gastroenterology;  Laterality: N/A;  . ESOPHAGOGASTRODUODENOSCOPY (EGD) WITH PROPOFOL N/A 03/05/2017   Procedure: ESOPHAGOGASTRODUODENOSCOPY (EGD) WITH PROPOFOL;  Surgeon: Lucilla Lame, MD;  Location: [facility] ENDOSCOPY;  Service: Endoscopy;  Laterality: N/A;  . ESOPHAGOGASTRODUODENOSCOPY (EGD) WITH PROPOFOL N/A 10/17/2017   Procedure: ESOPHAGOGASTRODUODENOSCOPY (EGD) WITH PROPOFOL;  Surgeon: Lollie Sails, MD;  Location: [facility] ENDOSCOPY;  Service: Endoscopy;  Laterality: N/A;  . EYE SURGERY Bilateral 2010   cataract  . HEMORRHOID SURGERY    . KYPHOPLASTY N/A 11/04/2016   Procedure: KYPHOPLASTY T 12;  Surgeon: Hessie Knows, MD;  Location: [facility] ORS;  Service: Orthopedics;  Laterality: N/A;    Prior to Admission medications   Medication Sig Start Date End Date Taking? Authorizing Provider  acetaminophen (TYLENOL) 500 MG tablet Take 500-1,000 mg by mouth 3 (three) times daily as needed for moderate pain or headache.    [provider]  albuterol (VENTOLIN HFA) 108 (90 Base) MCG/ACT inhaler Inhale 2 puffs into the lungs every 6 (six) hours as needed for wheezing or shortness of breath. 02/14/20   Brand Males, MD  azelastine (ASTELIN) 0.1 % nasal spray Place 1 spray into both nostrils 2 (two) times daily. Use in each nostril as directed 05/03/20   Einar Pheasant, MD  budesonide (ENTOCORT EC)  3 MG 24 hr capsule Take 9 mg by mouth daily as needed (colitis flare).  05/31/15   [provider]  calcium-vitamin D (OSCAL WITH D) 500-200 MG-UNIT tablet Take 2 tablets by mouth daily.    [provider]  ELIQUIS 5 MG TABS tablet Take 5 mg by mouth 2 (two) times daily.  05/23/16   [provider]  FIBER PO Take 1 capsule by mouth daily.     [provider]  fluticasone (FLONASE) 50 MCG/ACT nasal spray Place 1 spray into both nostrils daily. 11/24/19   [name], NP  Fluticasone-Umeclidin-Vilant (TRELEGY ELLIPTA) 100-62.5-25 MCG/INH AEPB Inhale 1 puff into the lungs daily. 02/14/20   Brand Males, MD  folic acid (FOLVITE) 1 MG tablet Take 1 tablet (1 mg total) by mouth daily. Can take any over-the-counter supplement. 04/11/20   [name], MD  furosemide (LASIX) 20 MG tablet Take 20 mg by mouth 2 (two) times daily.    [provider]  gabapentin (NEURONTIN) 100 MG capsule Take 4 capsules (400 mg) daily in the morning and midday. Take 8 capsules (800 mg) daily in the evening. 06/07/20   Einar Pheasant, MD  metoprolol tartrate (  LOPRESSOR) 100 MG tablet Take 1 tablet (100 mg total) by mouth 2 (two) times daily. 05/23/20   Einar Pheasant, MD  ondansetron (ZOFRAN ODT) 4 MG disintegrating tablet Take 1 tablet (4 mg total) by mouth every 8 (eight) hours as needed for nausea or vomiting. 07/07/20   [name], MD  pantoprazole (PROTONIX) 40 MG tablet Take 1 tablet (40 mg total) by mouth 2 (two) times daily. 06/30/20   Einar Pheasant, MD  sertraline (ZOLOFT) 50 MG tablet Take 1 tablet (50 mg total) by mouth daily. 06/30/20   Einar Pheasant, MD  triamcinolone ointment (KENALOG) 0.1 %  06/24/19   [provider]  vitamin B-12 (CYANOCOBALAMIN) 1000 MCG tablet Take 1,000 mcg by mouth daily.    [provider]    Allergies Iodinated diagnostic agents  Family History  Problem Relation Age of Onset  . Stroke Mother   . Heart  disease Father        MI - 67   . Colon cancer Neg Hx   . Prostate cancer Neg Hx     Social History Social History   Tobacco Use  . Smoking status: Former Smoker    Packs/day: 2.00    Years: 58.00    Pack years: 116.00    Types: Cigarettes    Start date: 08/19/1957    Quit date: 11/01/2015    Years since quitting: 4.6  . Smokeless tobacco: Never Used  Vaping Use  . Vaping Use: Never used  Substance Use Topics  . Alcohol use: Yes    Alcohol/week: 42.0 standard drinks    Types: 42 Cans of beer per week    Comment: occas  . Drug use: No    Review of Systems  Constitutional: No fever.  Positive for fatigue. Eyes: No redness. ENT: No sore throat. Cardiovascular: Denies chest pain. Respiratory: Denies shortness of breath. Gastrointestinal: No vomiting or diarrhea.  Genitourinary: Negative for hematuria.  Musculoskeletal: Negative for back pain. Skin: Negative for rash. Neurological: Negative for headache.   ____________________________________________   PHYSICAL EXAM:  VITAL SIGNS: ED Triage Vitals  Enc Vitals Group     BP 07/10/20 1037 (!) 155/83     Pulse Rate 07/10/20 1037 (!) 59     Resp 07/10/20 1037 20     Temp 07/10/20 1037 98.1 F (36.7 C)     Temp Source 07/10/20 1037 Oral     SpO2 07/10/20 1037 95 %     Weight 07/10/20 1038 170 lb (77.1 kg)     Height 07/10/20 1038 5\' 10"  (1.778 m)     Head Circumference --      Peak Flow --      Pain Score 07/10/20 1038 2     Pain Loc --      Pain Edu? --      Excl. in [place]? --     Constitutional: Alert and oriented.  Somewhat weak appearing but in no acute distress. Eyes: Conjunctivae are normal.  No scleral icterus. Head: Atraumatic. Nose: No congestion/rhinnorhea. Mouth/Throat: Mucous membranes are moist.   Neck: Normal range of motion.  Cardiovascular: Normal rate, regular rhythm.   Good peripheral circulation. Respiratory: Normal respiratory effort.  No retractions.  Gastrointestinal: Soft and  nontender. No distention.  Blackish stool, guaiac positive on DRE. Genitourinary: No flank tenderness. Musculoskeletal: Extremities warm and well perfused.  Neurologic:  Normal speech and language. No gross focal neurologic deficits are appreciated.  Skin:  Skin is warm and dry. No rash noted.  Psychiatric: Mood and affect are normal. Speech and behavior are normal.  ____________________________________________   LABS (all labs ordered are listed, but only abnormal results are displayed)  Labs Reviewed  COMPREHENSIVE METABOLIC PANEL - Abnormal; Notable for the following components:      Result Value   Sodium 122 (*)    Chloride 92 (*)    Calcium 8.6 (*)    Albumin 3.3 (*)    All other components within normal limits  CBC - Abnormal; Notable for the following components:   RBC 2.27 (*)    Hemoglobin 8.3 (*)    HCT 24.9 (*)    MCV 109.7 (*)    MCH 36.6 (*)    All other components within normal limits  RESP PANEL BY RT-PCR (FLU A&B, COVID) [id number]  PROTIME-INR  CBC WITH DIFFERENTIAL/PLATELET  POC OCCULT BLOOD, ED  TYPE AND SCREEN   ____________________________________________  EKG  ED ECG REPORT I, Arta Silence, the attending physician, personally viewed and interpreted this ECG.  Date: 07/10/2020 EKG Time: 1039 Rate: 59 Rhythm: normal sinus rhythm QRS Axis: normal Intervals: RBBB ST/T Wave abnormalities: normal Narrative Interpretation: no evidence of acute ischemia  ____________________________________________  RADIOLOGY    ____________________________________________   PROCEDURES  Procedure(s) performed: No  Procedures  Critical Care performed: No ____________________________________________   INITIAL IMPRESSION / ASSESSMENT AND PLAN / ED COURSE  Pertinent labs & imaging results that were available during my care of the patient were reviewed by me and considered in my medical decision making (see chart for details).  79 year old male with  PMH as noted above presents with melena over the last 3 days.  He has no vomiting or abdominal pain.  He is on Eliquis.  I reviewed the past medical records in [place].  The patient was recently seen on 11/17 after a mechanical fall.  Work-up was negative at that time.  He came back 2 days later due to persistent nausea, although work-up was again reassuring.  He was most recently admitted in August with cellulitis, however at that time was also noted to have iron deficiency anemia and EGD showed several nonbleeding angiectasia is in the stomach and duodenum which were treated with APC.  On exam currently, the patient is somewhat weak and frail appearing.  His vital signs are normal except for hypertension.  The abdomen is soft and nontender.  There is dark brown/blackish stool on DRE which is strongly guaiac positive.  Overall presentation is concerning for possible upper GI bleed.  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  Differential includes gastritis, PUD, or possible lower GI source.  We will obtain labs, start patient on a Protonix drip, and reassess.  ----------------------------------------- 9:02 PM on 07/10/2020 -----------------------------------------  CBC shows a two-point hemoglobin drop over the last 5 days.  The patient is chronically hyponatremic, and his sodium is somewhat worse today.  Lab work-up is otherwise unremarkable.  At this time, there is no indication for emergent transfusion.  We will check a repeat CBC after 4 hours.  I discussed the case with Dr. Josephine Cables from the hospitalist service for admission.  ____________________________________________   FINAL CLINICAL IMPRESSION(S) / ED DIAGNOSES  Final diagnoses:  Gastrointestinal hemorrhage, unspecified gastrointestinal hemorrhage type      NEW MEDICATIONS STARTED DURING THIS VISIT:  New Prescriptions   No medications on file     Note:  This document was prepared using Dragon voice recognition  software and may include unintentional dictation errors.    Arta Silence, MD 07/10/20  2103  

## 2020-07-10 NOTE — ED Notes (Signed)
Report called to floor RN at this time.

## 2020-07-10 NOTE — ED Notes (Signed)
Lab tech came to draw blood, but was unable to obtain samples despite multiple attempts.  Will readdress once in a room.

## 2020-07-10 NOTE — ED Triage Notes (Signed)
Pt via pov from home with melena x 6 days. Pt reports feeling "exhausted" and sob (has copd). Pt has multiple falls in last few weeks and feels off. Pt alert & oriented, nad noted.

## 2020-07-11 ENCOUNTER — Encounter: Payer: Self-pay | Admitting: Internal Medicine

## 2020-07-11 ENCOUNTER — Encounter
Admission: EM | Disposition: A | Discharge status: Home / Self Care | Payer: Self-pay | Source: Home / Self Care | Attending: Internal Medicine

## 2020-07-11 ENCOUNTER — Inpatient Hospital Stay: Payer: PPO | Admitting: Registered Nurse

## 2020-07-11 DIAGNOSIS — G629 Polyneuropathy, unspecified: Secondary | ICD-10-CM | POA: Diagnosis present

## 2020-07-11 DIAGNOSIS — K31819 Angiodysplasia of stomach and duodenum without bleeding: Secondary | ICD-10-CM

## 2020-07-11 DIAGNOSIS — E8809 Other disorders of plasma-protein metabolism, not elsewhere classified: Secondary | ICD-10-CM | POA: Diagnosis present

## 2020-07-11 DIAGNOSIS — K31811 Angiodysplasia of stomach and duodenum with bleeding: Principal | ICD-10-CM

## 2020-07-11 DIAGNOSIS — D62 Acute posthemorrhagic anemia: Secondary | ICD-10-CM

## 2020-07-11 DIAGNOSIS — F419 Anxiety disorder, unspecified: Secondary | ICD-10-CM | POA: Diagnosis present

## 2020-07-11 DIAGNOSIS — E871 Hypo-osmolality and hyponatremia: Secondary | ICD-10-CM | POA: Diagnosis present

## 2020-07-11 DIAGNOSIS — G8929 Other chronic pain: Secondary | ICD-10-CM | POA: Diagnosis present

## 2020-07-11 DIAGNOSIS — R531 Weakness: Secondary | ICD-10-CM | POA: Diagnosis present

## 2020-07-11 DIAGNOSIS — M549 Dorsalgia, unspecified: Secondary | ICD-10-CM | POA: Diagnosis present

## 2020-07-11 DIAGNOSIS — E870 Hyperosmolality and hypernatremia: Secondary | ICD-10-CM | POA: Diagnosis present

## 2020-07-11 DIAGNOSIS — D472 Monoclonal gammopathy: Secondary | ICD-10-CM | POA: Diagnosis present

## 2020-07-11 DIAGNOSIS — E538 Deficiency of other specified B group vitamins: Secondary | ICD-10-CM | POA: Diagnosis present

## 2020-07-11 DIAGNOSIS — I10 Essential (primary) hypertension: Secondary | ICD-10-CM | POA: Diagnosis present

## 2020-07-11 DIAGNOSIS — F32A Depression, unspecified: Secondary | ICD-10-CM | POA: Diagnosis present

## 2020-07-11 DIAGNOSIS — K921 Melena: Secondary | ICD-10-CM

## 2020-07-11 DIAGNOSIS — I951 Orthostatic hypotension: Secondary | ICD-10-CM

## 2020-07-11 DIAGNOSIS — Z9181 History of falling: Secondary | ICD-10-CM | POA: Diagnosis not present

## 2020-07-11 DIAGNOSIS — Z20822 Contact with and (suspected) exposure to covid-19: Secondary | ICD-10-CM | POA: Diagnosis present

## 2020-07-11 DIAGNOSIS — K449 Diaphragmatic hernia without obstruction or gangrene: Secondary | ICD-10-CM | POA: Diagnosis present

## 2020-07-11 DIAGNOSIS — K922 Gastrointestinal hemorrhage, unspecified: Secondary | ICD-10-CM | POA: Diagnosis present

## 2020-07-11 DIAGNOSIS — I2510 Atherosclerotic heart disease of native coronary artery without angina pectoris: Secondary | ICD-10-CM | POA: Diagnosis present

## 2020-07-11 DIAGNOSIS — J449 Chronic obstructive pulmonary disease, unspecified: Secondary | ICD-10-CM | POA: Diagnosis present

## 2020-07-11 DIAGNOSIS — K219 Gastro-esophageal reflux disease without esophagitis: Secondary | ICD-10-CM | POA: Diagnosis present

## 2020-07-11 DIAGNOSIS — E7800 Pure hypercholesterolemia, unspecified: Secondary | ICD-10-CM | POA: Diagnosis present

## 2020-07-11 DIAGNOSIS — I4892 Unspecified atrial flutter: Secondary | ICD-10-CM | POA: Diagnosis present

## 2020-07-11 DIAGNOSIS — Z8679 Personal history of other diseases of the circulatory system: Secondary | ICD-10-CM

## 2020-07-11 DIAGNOSIS — E785 Hyperlipidemia, unspecified: Secondary | ICD-10-CM | POA: Diagnosis present

## 2020-07-11 HISTORY — PX: ESOPHAGOGASTRODUODENOSCOPY (EGD) WITH PROPOFOL: SHX5813

## 2020-07-11 LAB — COMPREHENSIVE METABOLIC PANEL
ALT: 12 U/L (ref 0–44)
AST: 18 U/L (ref 15–41)
Albumin: 3.2 g/dL — ABNORMAL LOW (ref 3.5–5.0)
Alkaline Phosphatase: 57 U/L (ref 38–126)
Anion gap: 10 (ref 5–15)
BUN: 11 mg/dL (ref 8–23)
CO2: 22 mmol/L (ref 22–32)
Calcium: 9.1 mg/dL (ref 8.9–10.3)
Chloride: 95 mmol/L — ABNORMAL LOW (ref 98–111)
Creatinine, Ser: 1.13 mg/dL (ref 0.61–1.24)
GFR, Estimated: >60 mL/min (ref >60)
Glucose, Bld: 107 mg/dL — ABNORMAL HIGH (ref 70–99)
Potassium: 3.5 mmol/L (ref 3.5–5.1)
Sodium: 127 mmol/L — ABNORMAL LOW (ref 135–145)
Total Bilirubin: 0.7 mg/dL (ref 0.3–1.2)
Total Protein: 7.3 g/dL (ref 6.5–8.1)

## 2020-07-11 LAB — HEMOGLOBIN AND HEMATOCRIT, BLOOD
HCT: 24.1 % — ABNORMAL LOW (ref 39.0–52.0)
Hemoglobin: 8.1 g/dL — ABNORMAL LOW (ref 13.0–17.0)

## 2020-07-11 LAB — CBC
HCT: 23.4 % — ABNORMAL LOW (ref 39.0–52.0)
Hemoglobin: 7.7 g/dL — ABNORMAL LOW (ref 13.0–17.0)
MCH: 36.2 pg — ABNORMAL HIGH (ref 26.0–34.0)
MCHC: 32.9 g/dL (ref 30.0–36.0)
MCV: 109.9 fL — ABNORMAL HIGH (ref 80.0–100.0)
Platelets: 320 10*3/uL (ref 150–400)
RBC: 2.13 MIL/uL — ABNORMAL LOW (ref 4.22–5.81)
RDW: 14.8 % (ref 11.5–15.5)
WBC: 8.3 10*3/uL (ref 4.0–10.5)
nRBC: 0 % (ref 0.0–0.2)

## 2020-07-11 LAB — PROTIME-INR
INR: 1.2 (ref 0.8–1.2)
Prothrombin Time: 14.5 seconds (ref 11.4–15.2)

## 2020-07-11 LAB — APTT: aPTT: 39 seconds — ABNORMAL HIGH (ref 24–36)

## 2020-07-11 LAB — PHOSPHORUS: Phosphorus: 2.7 mg/dL (ref 2.5–4.6)

## 2020-07-11 LAB — MAGNESIUM: Magnesium: 1.7 mg/dL (ref 1.7–2.4)

## 2020-07-11 LAB — OSMOLALITY: Osmolality: 265 mOsm/kg — ABNORMAL LOW (ref 275–295)

## 2020-07-11 SURGERY — ESOPHAGOGASTRODUODENOSCOPY (EGD) WITH PROPOFOL
Anesthesia: General

## 2020-07-11 MED ORDER — DEXTROSE-NACL 5-0.9 % IV SOLN: INTRAVENOUS | Status: DC

## 2020-07-11 MED ORDER — PROPOFOL 500 MG/50ML IV EMUL
INTRAVENOUS | Status: DC | PRN
  Administered 2020-07-11: 50 ug/kg/min via INTRAVENOUS

## 2020-07-11 MED ORDER — FLUTICASONE-UMECLIDIN-VILANT 100-62.5-25 MCG/INH IN AEPB: 1.0000 | INHALATION_SPRAY | Freq: Every day | RESPIRATORY_TRACT | Status: DC

## 2020-07-11 MED ORDER — METOPROLOL TARTRATE 50 MG PO TABS
50.0000 mg | ORAL_TABLET | Freq: Two times a day (BID) | ORAL | Status: DC
  Administered 2020-07-11: 50 mg via ORAL
  Filled 2020-07-11: qty 1

## 2020-07-11 MED ORDER — FLUTICASONE PROPIONATE 50 MCG/ACT NA SUSP
1.0000 | Freq: Every day | NASAL | Status: DC
  Administered 2020-07-11: 1 via NASAL
  Filled 2020-07-11: qty 16

## 2020-07-11 MED ORDER — FLUTICASONE FUROATE-VILANTEROL 100-25 MCG/INH IN AEPB
1.0000 | INHALATION_SPRAY | Freq: Every day | RESPIRATORY_TRACT | Status: DC
  Administered 2020-07-11: 1 via RESPIRATORY_TRACT
  Filled 2020-07-11: qty 28

## 2020-07-11 MED ORDER — SERTRALINE HCL 50 MG PO TABS
50.0000 mg | ORAL_TABLET | Freq: Every day | ORAL | Status: DC
  Administered 2020-07-11: 50 mg via ORAL
  Filled 2020-07-11: qty 1

## 2020-07-11 MED ORDER — ENSURE ENLIVE PO LIQD
237.0000 mL | Freq: Two times a day (BID) | ORAL | Status: DC
  Administered 2020-07-11: 237 mL via ORAL

## 2020-07-11 MED ORDER — PROPOFOL 10 MG/ML IV BOLUS
INTRAVENOUS | Status: DC | PRN
  Administered 2020-07-11: 50 mg via INTRAVENOUS
  Administered 2020-07-11 (×2): 30 mg via INTRAVENOUS

## 2020-07-11 MED ORDER — GABAPENTIN 400 MG PO CAPS
400.0000 mg | ORAL_CAPSULE | Freq: Every day | ORAL | Status: DC
  Administered 2020-07-11: 400 mg via ORAL
  Filled 2020-07-11: qty 1

## 2020-07-11 MED ORDER — EPHEDRINE 5 MG/ML INJ
INTRAVENOUS | Status: AC
  Filled 2020-07-11: qty 10

## 2020-07-11 MED ORDER — AZELASTINE HCL 0.1 % NA SOLN
1.0000 | Freq: Two times a day (BID) | NASAL | Status: DC
  Administered 2020-07-11: 1 via NASAL
  Filled 2020-07-11: qty 30

## 2020-07-11 MED ORDER — LIDOCAINE HCL (PF) 2 % IJ SOLN
INTRAMUSCULAR | Status: AC
  Filled 2020-07-11: qty 5

## 2020-07-11 MED ORDER — PROPOFOL 10 MG/ML IV BOLUS
INTRAVENOUS | Status: AC
  Filled 2020-07-11: qty 40

## 2020-07-11 MED ORDER — HYDRALAZINE HCL 20 MG/ML IJ SOLN
10.0000 mg | Freq: Four times a day (QID) | INTRAMUSCULAR | Status: DC | PRN
  Administered 2020-07-11 (×2): 10 mg via INTRAVENOUS
  Filled 2020-07-11 (×2): qty 1

## 2020-07-11 MED ORDER — PROPOFOL 500 MG/50ML IV EMUL
INTRAVENOUS | Status: AC
  Filled 2020-07-11: qty 50

## 2020-07-11 MED ORDER — ALBUTEROL SULFATE HFA 108 (90 BASE) MCG/ACT IN AERS
2.0000 | INHALATION_SPRAY | Freq: Four times a day (QID) | RESPIRATORY_TRACT | Status: DC | PRN
  Filled 2020-07-11: qty 6.7

## 2020-07-11 MED ORDER — UMECLIDINIUM BROMIDE 62.5 MCG/INH IN AEPB
1.0000 | INHALATION_SPRAY | Freq: Every day | RESPIRATORY_TRACT | Status: DC
  Administered 2020-07-11: 1 via RESPIRATORY_TRACT
  Filled 2020-07-11: qty 7

## 2020-07-11 MED ORDER — GABAPENTIN 400 MG PO CAPS: 800.0000 mg | ORAL_CAPSULE | Freq: Every day | ORAL | Status: DC

## 2020-07-11 MED ORDER — LIDOCAINE HCL (CARDIAC) PF 100 MG/5ML IV SOSY
PREFILLED_SYRINGE | INTRAVENOUS | Status: DC | PRN
  Administered 2020-07-11: 50 mg via INTRAVENOUS

## 2020-07-11 MED ORDER — DIPHENHYDRAMINE HCL 12.5 MG/5ML PO ELIX
12.5000 mg | ORAL_SOLUTION | Freq: Once | ORAL | Status: AC
  Administered 2020-07-11: 12.5 mg via ORAL
  Filled 2020-07-11: qty 5

## 2020-07-11 MED ORDER — FENTANYL CITRATE (PF) 100 MCG/2ML IJ SOLN
INTRAMUSCULAR | Status: AC
  Administered 2020-07-11: 50 ug
  Filled 2020-07-11: qty 2

## 2020-07-11 MED ORDER — SODIUM CHLORIDE 0.9 % IV SOLN: INTRAVENOUS | Status: DC | PRN

## 2020-07-11 NOTE — Consult Note (Signed)
Troy Lame, MD [facility]  [address], [place], [place] [address] Phone: [phone] Fax : [phone]  Consultation  Referring Provider:     Dr. Josephine Cables Primary Care Physician:  Einar Pheasant, MD Primary Gastroenterologist:  Dr. Alice Reichert         Reason for Consultation:     Melena  Date of Admission:  07/10/2020 Date of Consultation:  07/11/2020         HPI:   Troy Lopez is a 79 y.o. male who has a history of of GI bleed in the past.  The patient has been on Eliquis and has a history of GERD.  The patient has had multiple upper endoscopies with AVMs found and treated with argon plasma coagulation.  The patient also had a colonoscopy by me in 2018.  The patient has also been found to have a hiatal hernia at his last upper endoscopies.  The patient's wife states that he was having some black stools so he called the [place] [facility] and told them that he was having dark stools.  The told him that his gastroenterologist was on vacation and that he should go to the ER.  In the emergency room the patient was found to have a hemoglobin of 8.3 with 4 days ago the hemoglobin was 9.9.  At 4:00 in the morning his hemoglobin was 7.7 with a repeat at 2:00pm today of 8.1.  The patient states that he has had no further signs of GI bleeding with any black stools.  The patient denies any abdominal pain fevers chills nausea or vomiting.  He did sustain a fall and has multiple bruises on his arms.  Past Medical History:  Diagnosis Date  . Adrenal gland anomaly    enlargement  . Anemia    IDA  . Anxiety   . CAD (coronary artery disease)   . Carpal tunnel syndrome   . Chronic back pain   . Chronic hyponatremia   . Colitis   . Colonic polyp   . COPD (chronic obstructive pulmonary disease) ([place])   . Degenerative disc disease, lumbar   . Depression   . Diverticulosis   . Dyspnea   . Gastric AVM   . GERD (gastroesophageal reflux disease)   . H/O degenerative disc disease   .  Hypercholesterolemia   . Hyperkalemia   . Hyperlipidemia   . Hypertension   . Irritable bowel syndrome   . Monoclonal gammopathy   . Monoclonal gammopathy   . Neuropathy   . Personal history of tobacco use, presenting hazards to health 08/17/2015  . Sleep apnea   . Vertebral compression fracture [facility])     Past Surgical History:  Procedure Laterality Date  . 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/09/2020   Procedure: AMPUTATION TOE;  Surgeon: Sharlotte Alamo, DPM;  Location: [facility] ORS;  Service: Podiatry;  Laterality: Right;  . BUNIONECTOMY  1989  . CARDIAC CATHETERIZATION    . CARPAL TUNNEL RELEASE Left 2011   ulnar nerve sub muscular at elbow  . CHOLECYSTECTOMY  09/07  . COLONOSCOPY WITH PROPOFOL N/A 03/05/2017   Procedure: COLONOSCOPY WITH PROPOFOL;  Surgeon: Troy Lame, MD;  Location: [facility] ENDOSCOPY;  Service: Endoscopy;  Laterality: N/A;  . ELECTROPHYSIOLOGIC STUDY N/A 11/15/2015   Procedure: CARDIOVERSION;  Surgeon: Isaias Cowman, MD;  Location: [facility] ORS;  Service: Cardiovascular;  Laterality: N/A;  . ESOPHAGOGASTRODUODENOSCOPY N/A 04/10/2020   Procedure: ESOPHAGOGASTRODUODENOSCOPY (EGD);  Surgeon: Toledo, Benay Pike, MD;  Location: [facility] ENDOSCOPY;  Service: Gastroenterology;  Laterality:  N/A;  . ESOPHAGOGASTRODUODENOSCOPY (EGD) WITH PROPOFOL N/A 03/05/2017   Procedure: ESOPHAGOGASTRODUODENOSCOPY (EGD) WITH PROPOFOL;  Surgeon: Troy Lame, MD;  Location[facility] ENDOSCOPY;  Service: Endoscopy;  Laterality: N/A;  . ESOPHAGOGASTRODUODENOSCOPY (EGD) WITH PROPOFOL N/A 10/17/2017   Procedure: ESOPHAGOGASTRODUODENOSCOPY (EGD) WITH PROPOFOL;  Surgeon: Lollie Sails, MD;  Location: [facility] ENDOSCOPY;  Service: Endoscopy;  Laterality: N/A;  . EYE SURGERY Bilateral 2010   cataract  . HEMORRHOID SURGERY    . KYPHOPLASTY N/A 11/04/2016   Procedure: KYPHOPLASTY T 12;  Surgeon: Hessie Knows, MD;  Location: [facility] ORS;  Service: Orthopedics;  Laterality: N/A;    Prior to Admission medications   Medication Sig  Start Date End Date Taking? Authorizing Provider  acetaminophen (TYLENOL) 500 MG tablet Take 500-1,000 mg by mouth 3 (three) times daily as needed for moderate pain or headache.    [provider]  albuterol (VENTOLIN HFA) 108 (90 Base) MCG/ACT inhaler Inhale 2 puffs into the lungs every 6 (six) hours as needed for wheezing or shortness of breath. 02/14/20   Brand Males, MD  azelastine (ASTELIN) 0.1 % nasal spray Place 1 spray into both nostrils 2 (two) times daily. Use in each nostril as directed 05/03/20   Einar Pheasant, MD  budesonide (ENTOCORT EC) 3 MG 24 hr capsule Take 9 mg by mouth daily as needed (colitis flare).  05/31/15   [provider]  calcium-vitamin D (OSCAL WITH D) 500-200 MG-UNIT tablet Take 2 tablets by mouth daily.    [provider]  ELIQUIS 5 MG TABS tablet Take 5 mg by mouth 2 (two) times daily.  05/23/16   [provider]  FIBER PO Take 1 capsule by mouth daily.     [provider]  fluticasone (FLONASE) 50 MCG/ACT nasal spray Place 1 spray into both nostrils daily. 11/24/19   [name], NP  Fluticasone-Umeclidin-Vilant (TRELEGY ELLIPTA) 100-62.5-25 MCG/INH AEPB Inhale 1 puff into the lungs daily. 02/14/20   Brand Males, MD  folic acid (FOLVITE) 1 MG tablet Take 1 tablet (1 mg total) by mouth daily. Can take any over-the-counter supplement. 04/11/20   [name], MD  furosemide (LASIX) 20 MG tablet Take 20 mg by mouth 2 (two) times daily.    [provider]  gabapentin (NEURONTIN) 100 MG capsule Take 4 capsules (400 mg) daily in the morning and midday. Take 8 capsules (800 mg) daily in the evening. 06/07/20   Einar Pheasant, MD  metoprolol tartrate (LOPRESSOR) 100 MG tablet Take 1 tablet (100 mg total) by mouth 2 (two) times daily. 05/23/20   Einar Pheasant, MD  ondansetron (ZOFRAN ODT) 4 MG disintegrating tablet Take 1 tablet (4 mg total) by mouth every 8 (eight) hours as needed for nausea or vomiting.  07/07/20   [name], MD  pantoprazole (PROTONIX) 40 MG tablet Take 1 tablet (40 mg total) by mouth 2 (two) times daily. 06/30/20   Einar Pheasant, MD  sertraline (ZOLOFT) 50 MG tablet Take 1 tablet (50 mg total) by mouth daily. 06/30/20   Einar Pheasant, MD  triamcinolone ointment (KENALOG) 0.1 %  06/24/19   [provider]  vitamin B-12 (CYANOCOBALAMIN) 1000 MCG tablet Take 1,000 mcg by mouth daily.    [provider]    Family History  Problem Relation Age of Onset  . Stroke Mother   . Heart disease Father        MI - 47   . Colon cancer Neg Hx   . Prostate cancer Neg Hx  Social History   Tobacco Use  . Smoking status: Former Smoker    Packs/day: 2.00    Years: 58.00    Pack years: 116.00    Types: Cigarettes    Start date: 08/19/1957    Quit date: 11/01/2015    Years since quitting: 4.6  . Smokeless tobacco: Never Used  Vaping Use  . Vaping Use: Never used  Substance Use Topics  . Alcohol use: Yes    Alcohol/week: 42.0 standard drinks    Types: 42 Cans of beer per week    Comment: occas  . Drug use: No    Allergies as of 07/10/2020 - Review Complete 07/10/2020  Allergen Reaction Noted  . Iodinated diagnostic agents Other (See Comments) 09/08/2012    Review of Systems:    All systems reviewed and negative except where noted in HPI.   Physical Exam:  Vital signs in last 24 hours: Temp:  [97.4 F (36.3 C)-98.4 F (36.9 C)] 98.4 F (36.9 C) (11/23 1215) Pulse Rate:  [64-76] 70 (11/23 1215) Resp:  [11-21] 18 (11/23 1215) BP: (137-188)/(67-119) 137/67 (11/23 1215) SpO2:  [94 %-100 %] 94 % (11/23 1215) Last BM Date: 07/09/20 General:   Pleasant, cooperative in NAD Head:  Normocephalic and atraumatic. Eyes:   No icterus.   Conjunctiva pink. PERRLA. Ears:  Normal auditory acuity. Neck:  Supple; no masses or thyroidomegaly Lungs: Respirations even and unlabored. Lungs clear to auscultation bilaterally.   No wheezes, crackles, or  rhonchi.  Heart:  Regular rate and rhythm;  Without murmur, clicks, rubs or gallops Abdomen:  Soft, nondistended, nontender. Normal bowel sounds. No appreciable masses or hepatomegaly.  No rebound or guarding.  Rectal:  Not performed. Msk:  Symmetrical without gross deformities.    Extremities:  Without edema, cyanosis or clubbing. Neurologic:  Alert and oriented x3;  grossly normal neurologically. Skin: Ecchymosis on his extremities bilaterally with bandages all over his left arm from a recent fall. Cervical Nodes:  No significant cervical adenopathy. Psych:  Alert and cooperative. Normal affect.  LAB RESULTS: Recent Labs    07/10/20 1042 07/10/20 1042 07/10/20 2210 07/11/20 0412 07/11/20 1410  WBC 7.1  --  7.9 8.3  --   HGB 8.3*   < > 8.8* 7.7* 8.1*  HCT 24.9*   < > 26.0* 23.4* 24.1*  PLT 336  --  349 320  --    < > = values in this interval not displayed.   BMET Recent Labs    07/10/20 1042 07/11/20 0412  NA 122* 127*  K 3.9 3.5  CL 92* 95*  CO2 24 22  GLUCOSE 92 107*  BUN 12 11  CREATININE 1.05 1.13  CALCIUM 8.6* 9.1   LFT Recent Labs    07/11/20 0412  PROT 7.3  ALBUMIN 3.2*  AST 18  ALT 12  ALKPHOS 57  BILITOT 0.7   PT/INR Recent Labs    07/10/20 1042 07/11/20 0412  LABPROT 14.1 14.5  INR 1.1 1.2    STUDIES: No results found.    Impression / Plan:   Assessment: Principal Problem:   Acute blood loss anemia Active Problems:   CAD (coronary artery disease)   Hypertension   Hyponatremia   GERD (gastroesophageal reflux disease)   Headache   GI bleed   History of atrial flutter   Hypoalbuminemia   Orthostatic hypotension   Vitamin B12 deficiency   GI bleeding   Troy Lopez is a 79 y.o. y/o male with who comes in  to the emergency room with black stools and anemia and a history of AVMs in the stomach.  The patient has had no further signs of GI bleeding since coming in.  The patient's wife was with the patient at the time of my  interview.  They are both eager to go home.  Plan:  The plan is to bring the patient to the endoscopy department today and do an upper endoscopy to see if there is any sign of the source of his melena.  His hemoglobin appears to be stable with his most recent hemoglobin being 8.1 up from 7.7 this morning.  The patient has been explained the plan of doing an EGD today which she is in favor of and further recommendations on whether he can go home or not will be based on what is found during the endoscopy.  The patient and his wife have been explained the plan and agree with it.  Thank you for involving me in the care of this patient.      LOS: 0 days   Troy Lame, MD, [facility] 07/11/2020, 2:58 PM,  Pager [phone] 7am-5pm  Check AMION for 5pm -7am coverage and on weekends   Note: This dictation was prepared with Dragon dictation along with smaller phrase technology. Any transcriptional errors that result from this process are unintentional.

## 2020-07-11 NOTE — Anesthesia Preprocedure Evaluation (Signed)
Anesthesia Evaluation  Patient identified by MRN, date of birth, ID band Patient awake    Reviewed: Allergy & Precautions, H&P , NPO status , Patient's Chart, lab work & pertinent test results  History of Anesthesia Complications Negative for: history of anesthetic complications  Airway Mallampati: III  TM Distance: <3 FB Neck ROM: limited    Dental  (+) Upper Dentures, Lower Dentures   Pulmonary shortness of breath, sleep apnea , COPD, former smoker,    Pulmonary exam normal        Cardiovascular hypertension, (-) angina+ CAD  Normal cardiovascular exam+ dysrhythmias Atrial Fibrillation      Neuro/Psych  Headaches, PSYCHIATRIC DISORDERS  Neuromuscular disease    GI/Hepatic negative GI ROS, Neg liver ROS, GERD  ,  Endo/Other  negative endocrine ROS  Renal/GU negative Renal ROS  negative genitourinary   Musculoskeletal   Abdominal   Peds  Hematology  (+) anemia ,   Anesthesia Other Findings Past Medical History: 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 No date: Anemia     Comment:  IDA No date: Anxiety No date: CAD (coronary artery disease) No date: Carpal tunnel syndrome No date: Chronic back pain No date: Chronic hyponatremia No date: Colitis No date: Colonic polyp No date: COPD (chronic obstructive pulmonary disease) ([facility]) No date: Degenerative disc disease, lumbar No date: Depression No date: Diverticulosis No date: Dyspnea No date: Gastric AVM No date: GERD (gastroesophageal reflux disease) No date: H/O degenerative disc disease No date: Hypercholesterolemia No date: Hyperkalemia No date: Hyperlipidemia No date: Hypertension No date: Irritable bowel syndrome 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 No date: Neuropathy 08/17/2015: Personal history of tobacco use, presenting hazards to  health No date: Sleep apnea No date: Vertebral compression fracture  [facility])  Past Surgical History: 04/09/2020: AMPUTATION TOE; Right     Comment:  Procedure: AMPUTATION TOE;  Surgeon: Sharlotte Alamo, DPM;                Location: [facility] ORS;  Service: Podiatry;  Laterality:               Right; 1989: BUNIONECTOMY No date: CARDIAC CATHETERIZATION 2011: CARPAL TUNNEL RELEASE; Left     Comment:  ulnar nerve sub muscular at elbow 09/07: CHOLECYSTECTOMY 03/05/2017: COLONOSCOPY WITH PROPOFOL; N/A     Comment:  Procedure: COLONOSCOPY WITH PROPOFOL;  Surgeon: Lucilla Lame, MD;  Location: [facility] ENDOSCOPY;  Service:               Endoscopy;  Laterality: N/A; 11/15/2015: ELECTROPHYSIOLOGIC STUDY; N/A     Comment:  Procedure: CARDIOVERSION;  Surgeon: Isaias Cowman,              MD;  Location: [facility] ORS;  Service: Cardiovascular;                Laterality: N/A; 04/10/2020: ESOPHAGOGASTRODUODENOSCOPY; N/A     Comment:  Procedure: ESOPHAGOGASTRODUODENOSCOPY (EGD);  Surgeon:               Toledo, Benay Pike, MD;  Location: [facility] ENDOSCOPY;                Service: Gastroenterology;  Laterality: N/A; 03/05/2017: ESOPHAGOGASTRODUODENOSCOPY (EGD) WITH PROPOFOL; N/A     Comment:  Procedure: ESOPHAGOGASTRODUODENOSCOPY (EGD) WITH               PROPOFOL;  Surgeon: Lucilla Lame, MD;  Location: [facility]  ENDOSCOPY;  Service: Endoscopy;  Laterality: N/A; 10/17/2017: ESOPHAGOGASTRODUODENOSCOPY (EGD) WITH PROPOFOL; N/A     Comment:  Procedure: ESOPHAGOGASTRODUODENOSCOPY (EGD) WITH               PROPOFOL;  Surgeon: Lollie Sails, MD;  Location:               [facility] ENDOSCOPY;  Service: Endoscopy;  Laterality: N/A; 2010: EYE SURGERY; Bilateral     Comment:  cataract No date: HEMORRHOID SURGERY 11/04/2016: KYPHOPLASTY; N/A     Comment:  Procedure: KYPHOPLASTY T 12;  Surgeon: Hessie Knows, MD;              Location: [facility] ORS;  Service: Orthopedics;  Laterality:               N/A;  BMI    Body Mass Index: 24.39 kg/m      Reproductive/Obstetrics negative OB  ROS                             Anesthesia Physical Anesthesia Plan  ASA: III  Anesthesia Plan: General   Post-op Pain Management:    Induction: Intravenous  PONV Risk Score and Plan: Propofol infusion and TIVA  Airway Management Planned: Natural Airway and Nasal Cannula  Additional Equipment:   Intra-op Plan:   Post-operative Plan:   Informed Consent: I have reviewed the patients History and Physical, chart, labs and discussed the procedure including the risks, benefits and alternatives for the proposed anesthesia with the patient or authorized representative who has indicated his/her understanding and acceptance.     Dental Advisory Given  Plan Discussed with: Anesthesiologist, CRNA and Surgeon  Anesthesia Plan Comments: (Patient consented for risks of anesthesia including but not limited to:  - adverse reactions to medications - risk of airway placement if required - damage to eyes, teeth, lips or other oral mucosa - nerve damage due to positioning  - sore throat or hoarseness - Damage to heart, brain, nerves, lungs, other parts of body or loss of life  Patient voiced understanding.)        Anesthesia Quick Evaluation

## 2020-07-11 NOTE — Discharge Summary (Signed)
Physician Discharge Summary  Troy Lopez DXI:[id number] DOB: [birth year] DOA: 07/10/2020  PCP: Einar Pheasant, MD  Admit date: 07/10/2020 Discharge date: 07/11/2020  Discharge disposition: Home   Recommendations for Outpatient Follow-Up:   Follow up with PCP in 1 week   Discharge Diagnosis:   Principal Problem:   Acute blood loss anemia Active Problems:   CAD (coronary artery disease)   Hypertension   Hyponatremia   GERD (gastroesophageal reflux disease)   Headache   GI bleed   History of atrial flutter   Hypoalbuminemia   Orthostatic hypotension   Vitamin B12 deficiency   GI bleeding   Melena   Angiodysplasia of stomach    Discharge Condition: Stable.  Diet recommendation:  Diet Order            Diet NPO time specified  Diet effective now           Diet - low sodium heart healthy                   Code Status: Full Code     Hospital Course:   Troy Lopez is a 79 y.o. male with medical history significant forCAD, paroxysmal a flutteron Eliquis,GERD,COPDwho presented to the emergency department due to 3 to 4-day onset of dark tarry stool.  He also complained of shortness of breath and generalized weakness.  He reported a history of falls at home and he was seen in the ED on 07/07/2020 for a fall on 07/05/2020.  He was treated with IV fluids and IV Protonix drip.  He was evaluated by the gastroenterologist, Dr. Allen Norris, and he underwent upper endoscopy.  EGD revealed medium size hiatal hernia and multiple nonbleeding angiodysplastic lesions in the stomach that were treated with argon plasma coagulation.  He had acute blood loss anemia but it did not require blood transfusion.  Melena has resolved.  He feels better and he insisted on being discharged home today.  He is deemed stable for discharge to home.  From Dr. Dorothey Baseman standpoint, patient is okay for discharge.  Of note, patient was suspected to stay in the hospital for at least 2  midnights but his condition improved rather quickly and work-up was completed in a timely manner.  Therefore, he was discharged without spending 2 midnights in the hospital.     Medical Consultants:    Gastroenterologist   Discharge Exam:    Vitals:   07/11/20 1601 07/11/20 1611 07/11/20 1621 07/11/20 1631  BP: (!) 158/86 (!) 167/93 (!) 182/87 (!) 174/92  Pulse: 77 74 73 73  Resp: 15 12 13 16   Temp:      TempSrc:      SpO2: 98% 93% 97% 94%  Weight:      Height:         GEN: NAD SKIN: No rash EYES: EOMI ENT: MMM CV: RRR PULM: CTA B ABD: soft, ND, NT, +BS CNS: AAO x 3, non focal EXT: No edema or tenderness   The results of significant diagnostics from this hospitalization (including imaging, microbiology, ancillary and laboratory) are listed below for reference.     Procedures and Diagnostic Studies:   No results found.   Labs:   Basic Metabolic Panel: Recent Labs  Lab 07/05/20 1400 07/05/20 1400 07/07/20 1336 07/07/20 1336 07/07/20 1536 07/07/20 1536 07/10/20 1042 07/11/20 0412  NA 128*  --  127*  --  126*  --  122* 127*  K 4.0   < > 4.6   < >  4.0   < > 3.9 3.5  CL 90*  --  92*  --  91*  --  92* 95*  CO2 26  --  23  --  25  --  24 22  GLUCOSE 96  --  83  --  105*  --  92 107*  BUN 17  --  17  --  16  --  12 11  CREATININE 1.08  --  0.99  --  1.04  --  1.05 1.13  CALCIUM 9.7  --  9.2  --  9.4  --  8.6* 9.1  MG  --   --   --   --   --   --   --  1.7  PHOS  --   --   --   --   --   --   --  2.7   < > = values in this interval not displayed.   GFR Estimated Creatinine Clearance: 55.6 mL/min (by C-G formula based on SCr of 1.13 mg/dL). Liver Function Tests: Recent Labs  Lab 07/10/20 1042 07/11/20 0412  AST 17 18  ALT 10 12  ALKPHOS 67 57  BILITOT 0.6 0.7  PROT 8.0 7.3  ALBUMIN 3.3* 3.2*   No results for input(s): LIPASE, AMYLASE in the last 168 hours. No results for input(s): AMMONIA in the last 168 hours. Coagulation profile Recent  Labs  Lab 07/10/20 1042 07/11/20 0412  INR 1.1 1.2    CBC: Recent Labs  Lab 07/05/20 1400 07/05/20 1400 07/07/20 1536 07/10/20 1042 07/10/20 2210 07/11/20 0412 07/11/20 1410  WBC 9.2  --  8.3 7.1 7.9 8.3  --   NEUTROABS  --   --   --   --  5.6  --   --   HGB 10.6*   < > 9.9* 8.3* 8.8* 7.7* 8.1*  HCT 31.6*   < > 30.0* 24.9* 26.0* 23.4* 24.1*  MCV 110.5*  --  110.3* 109.7* 108.3* 109.9*  --   PLT 366  --  349 336 349 320  --    < > = values in this interval not displayed.   Cardiac Enzymes: No results for input(s): CKTOTAL, CKMB, CKMBINDEX, TROPONINI in the last 168 hours. BNP: Invalid input(s): POCBNP CBG: No results for input(s): GLUCAP in the last 168 hours. D-Dimer No results for input(s): DDIMER in the last 72 hours. Hgb A1c No results for input(s): HGBA1C in the last 72 hours. Lipid Profile No results for input(s): CHOL, HDL, LDLCALC, TRIG, CHOLHDL, LDLDIRECT in the last 72 hours. Thyroid function studies No results for input(s): TSH, T4TOTAL, T3FREE, THYROIDAB in the last 72 hours.  Invalid input(s): FREET3 Anemia work up No results for input(s): VITAMINB12, FOLATE, FERRITIN, TIBC, IRON, RETICCTPCT in the last 72 hours. Microbiology Recent Results (from the past 240 hour(s))  Resp Panel by RT-PCR (Flu A&B, Covid) Nasopharyngeal Swab     Status: None   Collection Time: 07/10/20  6:15 PM   Specimen: Nasopharyngeal Swab; Nasopharyngeal(NP) swabs in vial transport medium  Result Value Ref Range Status   SARS Coronavirus 2 by RT PCR NEGATIVE NEGATIVE Final    Comment: (NOTE) SARS-CoV-2 target nucleic acids are NOT DETECTED.  The SARS-CoV-2 RNA is generally detectable in upper respiratory specimens during the acute phase of infection. The lowest concentration of SARS-CoV-2 viral copies this assay can detect is 138 copies/mL. A negative result does not preclude SARS-Cov-2 infection and should not be used as the sole basis for  treatment or other patient  management decisions. A negative result may occur with  improper specimen collection/handling, submission of specimen other than nasopharyngeal swab, presence of viral mutation(s) within the areas targeted by this assay, and inadequate number of viral copies(<138 copies/mL). A negative result must be combined with clinical observations, patient history, and epidemiological information. The expected result is Negative.  Fact Sheet for Patients:  EntrepreneurPulse.com.au  Fact Sheet for Healthcare Providers:  IncredibleEmployment.be  This test is no t yet approved or cleared by the [place] FDA and  has been authorized for detection and/or diagnosis of SARS-CoV-2 by FDA under an Emergency Use Authorization (EUA). This EUA will remain  in effect (meaning this test can be used) for the duration of the COVID-19 declaration under Section 564(b)(1) of the Act, 21 U.S.C.section 360bbb-3(b)(1), unless the authorization is terminated  or revoked sooner.       Influenza A by PCR NEGATIVE NEGATIVE Final   Influenza B by PCR NEGATIVE NEGATIVE Final    Comment: (NOTE) The Xpert Xpress SARS-CoV-2/FLU/RSV plus assay is intended as an aid in the diagnosis of influenza from Nasopharyngeal swab specimens and should not be used as a sole basis for treatment. Nasal washings and aspirates are unacceptable for Xpert Xpress SARS-CoV-2/FLU/RSV testing.  Fact Sheet for Patients: EntrepreneurPulse.com.au  Fact Sheet for Healthcare Providers: IncredibleEmployment.be  This test is not yet approved or cleared by the [place] FDA and has been authorized for detection and/or diagnosis of SARS-CoV-2 by FDA under an Emergency Use Authorization (EUA). This EUA will remain in effect (meaning this test can be used) for the duration of the COVID-19 declaration under Section 564(b)(1) of the Act, 21 U.S.C. section 360bbb-3(b)(1),  unless the authorization is terminated or revoked.  Performed at 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   albuterol 108 (90 Base) MCG/ACT inhaler Commonly known as: VENTOLIN HFA Inhale 2 puffs into the lungs every 6 (six) hours as needed for wheezing or shortness of breath.   azelastine 0.1 % nasal spray Commonly known as: ASTELIN Place 1 spray into both nostrils 2 (two) times daily. Use in each nostril as directed   budesonide 3 MG 24 hr capsule Commonly known as: ENTOCORT EC Take 9 mg by mouth daily as needed (colitis flare).   calcium-vitamin D 500-200 MG-UNIT tablet Commonly known as: OSCAL WITH D Take 2 tablets by mouth daily.   Eliquis 5 MG Tabs tablet Generic drug: apixaban Take 5 mg by mouth 2 (two) times daily.   FIBER PO Take 1 capsule by mouth daily.   fluticasone 50 MCG/ACT nasal spray Commonly known as: FLONASE Place 1 spray into both nostrils daily.   folic acid 1 MG tablet Commonly known as: FOLVITE Take 1 tablet (1 mg total) by mouth daily.  Can take any over-the-counter supplement.   furosemide 20 MG tablet Commonly known as: LASIX Take 20 mg by mouth 2 (two) times daily.   gabapentin 100 MG capsule Commonly known as: NEURONTIN Take 4 capsules (400 mg) daily in the morning and midday. Take 8 capsules (800  mg) daily in the evening.   metoprolol tartrate 100 MG tablet Commonly known as: LOPRESSOR Take 1 tablet (100 mg total) by mouth 2 (two) times daily.   ondansetron 4 MG disintegrating tablet Commonly known as: Zofran ODT Take 1 tablet (4 mg total) by mouth every 8 (eight) hours as needed for nausea or vomiting.   pantoprazole 40 MG tablet Commonly known as: PROTONIX Take 1 tablet (40 mg total) by mouth 2 (two) times daily.   sertraline 50 MG tablet Commonly known as: ZOLOFT Take 1 tablet (50 mg total) by mouth daily.   Trelegy Ellipta 100-62.5-25 MCG/INH Aepb Generic drug: Fluticasone-Umeclidin-Vilant Inhale 1 puff into the lungs daily.   triamcinolone ointment 0.1 % Commonly known as: KENALOG   vitamin B-12 1000 MCG tablet Commonly known as: CYANOCOBALAMIN Take 1,000 mcg by mouth daily.         Time coordinating discharge: 33 minutes  Signed:  Maziah Smola  [facility] 07/11/2020, 5:09 PM   Pager on [email or web address]. If 7PM-7AM, please contact night-coverage at [email or web address]

## 2020-07-11 NOTE — Anesthesia Postprocedure Evaluation (Signed)
Anesthesia Post Note  Patient: Airon Sahni Maddison  Procedure(s) Performed: ESOPHAGOGASTRODUODENOSCOPY (EGD) WITH PROPOFOL (N/A )  Patient location during evaluation: Endoscopy Anesthesia Type: General Level of consciousness: awake and alert Pain management: pain level controlled Vital Signs Assessment: post-procedure vital signs reviewed and stable Respiratory status: spontaneous breathing, nonlabored ventilation, respiratory function stable and patient connected to nasal cannula oxygen Cardiovascular status: blood pressure returned to baseline and stable Postop Assessment: no apparent nausea or vomiting Anesthetic complications: no Comments: Patient had baseline headache that improved with caffeine    No complications documented.   Last Vitals:  Vitals:   07/11/20 1621 07/11/20 1631  BP: (!) 182/87 (!) 174/92  Pulse: 73 73  Resp: 13 16  Temp:    SpO2: 97% 94%    Last Pain:  Vitals:   07/11/20 1517  TempSrc: Temporal  PainSc: 0-No pain                 Precious Haws Shila Kruczek

## 2020-07-11 NOTE — Progress Notes (Signed)
Progress Note    Troy Lopez  MGQ:[id number] DOB: [birth year]  DOA: 07/10/2020 PCP: Einar Pheasant, MD      Brief Narrative:    Medical records reviewed and are as summarized below:  Troy Lopez is a 79 y.o. male  with medical history significant for CAD, paroxysmal a flutter on Eliquis, GERD, COPD who presented to the emergency department due to 3 to 4-day onset of dark tarry stool.  He also complained of shortness of breath and generalized weakness.  He reported a history of falls at home and he was seen in the ED on 07/07/2020 for a fall on 07/05/2020.     Assessment/Plan:   Principal Problem:   Acute blood loss anemia Active Problems:   CAD (coronary artery disease)   Hypertension   Hyponatremia   GERD (gastroesophageal reflux disease)   Headache   GI bleed   History of atrial flutter   Hypoalbuminemia   Orthostatic hypotension   Vitamin B12 deficiency   GI bleeding    Body mass index is 24.39 kg/m.    Acute GI bleeding/melena: Continue IV Protonix infusion.  Treat with IV fluids.  Gastroenterologist has been consulted.  Awaiting further recommendations.  Acute blood loss anemia: Hemoglobin was 10.7 on 07/05/2020.  It is 7.7 today.  Continue to monitor H&H.  Transfuse if hemoglobin drops below 7.  Orthostatic hypotension: This is likely from GI bleed.  COPD: Continue bronchodilators  Paroxysmal atrial flutter: Eliquis has been held because of GI bleed  Hyponatremia: Patient has chronic hyponatremia.  He is asymptomatic from this.  Generalized weakness/falls at home: PT evaluation after GI evaluation is complete.  Other comorbidities include vitamin B12 deficiency, GERD,   Diet Order            Diet NPO time specified Except for: Sips with Meds  Diet effective midnight                    Consultants:  Gastroenterologist  Procedures:  None    Medications:   . azelastine  1 spray Each Nare BID  . feeding supplement   237 mL Oral BID BM  . fluticasone  1 spray Each Nare Daily  . fluticasone furoate-vilanterol  1 puff Inhalation Daily   And  . umeclidinium bromide  1 puff Inhalation Daily  . gabapentin  400 mg Oral Daily  . gabapentin  800 mg Oral QHS  . metoprolol tartrate  50 mg Oral BID  . sertraline  50 mg Oral Daily  . sodium chloride flush  10-40 mL Intracatheter Q12H   Continuous Infusions: . dextrose 5 % and 0.9% NaCl    . pantoprozole (PROTONIX) infusion 8 mg/hr (07/11/20 0656)     Anti-infectives (From admission, onward)   None             Family Communication/Anticipated D/C date and plan/Code Status   DVT prophylaxis: SCDs Start: 07/10/20 2146     Code Status: Full Code  Family Communication:  Disposition Plan:    Status is: Inpatient  Remains inpatient appropriate because:Ongoing diagnostic testing needed not appropriate for outpatient work up and IV treatments appropriate due to intensity of illness or inability to take PO   Dispo: The patient is from: Home              Anticipated d/c is to: Home              Anticipated d/c date is: 1 day  Patient currently is not medically stable to d/c.                      Subjective:   C/o black stools.  He also complains of shortness of breath and requested his inhaler for COPD.  Objective:    Vitals:   07/11/20 0419 07/11/20 0739 07/11/20 0802 07/11/20 1215  BP: (!) 163/71 (!) 188/88 (!) 154/73 137/67  Pulse: 76 74 69 70  Resp: 16 18 18 18   Temp: 97.8 F (36.6 C) 97.8 F (36.6 C)  98.4 F (36.9 C)  TempSrc: Oral Oral  Oral  SpO2: 100%  97% 94%  Weight:      Height:       No data found.   Intake/Output Summary (Last 24 hours) at 07/11/2020 1406 Last data filed at 07/11/2020 0658 Gross per 24 hour  Intake 159.67 ml  Output 600 ml  Net -440.33 ml   Filed Weights   07/10/20 1038  Weight: 77.1 kg    Exam:  GEN: NAD SKIN: No rash EYES: EOMI ENT: MMM CV:  RRR PULM: CTA B ABD: soft, ND, NT, +BS CNS: AAO x 3, non focal EXT: No edema or tenderness   Data Reviewed:   I have personally reviewed following labs and imaging studies:  Labs: Labs show the following:   Basic Metabolic Panel: Recent Labs  Lab 07/05/20 1400 07/05/20 1400 07/07/20 1336 07/07/20 1336 07/07/20 1536 07/07/20 1536 07/10/20 1042 07/11/20 0412  NA 128*  --  127*  --  126*  --  122* 127*  K 4.0   < > 4.6   < > 4.0   < > 3.9 3.5  CL 90*  --  92*  --  91*  --  92* 95*  CO2 26  --  23  --  25  --  24 22  GLUCOSE 96  --  83  --  105*  --  92 107*  BUN 17  --  17  --  16  --  12 11  CREATININE 1.08  --  0.99  --  1.04  --  1.05 1.13  CALCIUM 9.7  --  9.2  --  9.4  --  8.6* 9.1  MG  --   --   --   --   --   --   --  1.7  PHOS  --   --   --   --   --   --   --  2.7   < > = values in this interval not displayed.   GFR Estimated Creatinine Clearance: 55.6 mL/min (by C-G formula based on SCr of 1.13 mg/dL). Liver Function Tests: Recent Labs  Lab 07/10/20 1042 07/11/20 0412  AST 17 18  ALT 10 12  ALKPHOS 67 57  BILITOT 0.6 0.7  PROT 8.0 7.3  ALBUMIN 3.3* 3.2*   No results for input(s): LIPASE, AMYLASE in the last 168 hours. No results for input(s): AMMONIA in the last 168 hours. Coagulation profile Recent Labs  Lab 07/10/20 1042 07/11/20 0412  INR 1.1 1.2    CBC: Recent Labs  Lab 07/05/20 1400 07/07/20 1536 07/10/20 1042 07/10/20 2210 07/11/20 0412  WBC 9.2 8.3 7.1 7.9 8.3  NEUTROABS  --   --   --  5.6  --   HGB 10.6* 9.9* 8.3* 8.8* 7.7*  HCT 31.6* 30.0* 24.9* 26.0* 23.4*  MCV 110.5* 110.3* 109.7* 108.3* 109.9*  PLT 366 349  336 349 320   Cardiac Enzymes: No results for input(s): CKTOTAL, CKMB, CKMBINDEX, TROPONINI in the last 168 hours. BNP (last 3 results) No results for input(s): PROBNP in the last 8760 hours. CBG: No results for input(s): GLUCAP in the last 168 hours. D-Dimer: No results for input(s): DDIMER in the last 72  hours. Hgb A1c: No results for input(s): HGBA1C in the last 72 hours. Lipid Profile: No results for input(s): CHOL, HDL, LDLCALC, TRIG, CHOLHDL, LDLDIRECT in the last 72 hours. Thyroid function studies: No results for input(s): TSH, T4TOTAL, T3FREE, THYROIDAB in the last 72 hours.  Invalid input(s): FREET3 Anemia work up: No results for input(s): VITAMINB12, FOLATE, FERRITIN, TIBC, IRON, RETICCTPCT in the last 72 hours. Sepsis Labs: Recent Labs  Lab 07/07/20 1536 07/10/20 1042 07/10/20 2210 07/11/20 0412  WBC 8.3 7.1 7.9 8.3    Microbiology Recent Results (from the past 240 hour(s))  Resp Panel by RT-PCR (Flu A&B, Covid) Nasopharyngeal Swab     Status: None   Collection Time: 07/10/20  6:15 PM   Specimen: Nasopharyngeal Swab; Nasopharyngeal(NP) swabs in vial transport medium  Result Value Ref Range Status   SARS Coronavirus 2 by RT PCR NEGATIVE NEGATIVE Final    Comment: (NOTE) SARS-CoV-2 target nucleic acids are NOT DETECTED.  The SARS-CoV-2 RNA is generally detectable in upper respiratory specimens during the acute phase of infection. The lowest concentration of SARS-CoV-2 viral copies this assay can detect is 138 copies/mL. A negative result does not preclude SARS-Cov-2 infection and should not be used as the sole basis for treatment or other patient management decisions. A negative result may occur with  improper specimen collection/handling, submission of specimen other than nasopharyngeal swab, presence of viral mutation(s) within the areas targeted by this assay, and inadequate number of viral copies(<138 copies/mL). A negative result must be combined with clinical observations, patient history, and epidemiological information. The expected result is Negative.  Fact Sheet for Patients:  EntrepreneurPulse.com.au  Fact Sheet for Healthcare Providers:  IncredibleEmployment.be  This test is no t yet approved or cleared by the  [place] FDA and  has been authorized for detection and/or diagnosis of SARS-CoV-2 by FDA under an Emergency Use Authorization (EUA). This EUA will remain  in effect (meaning this test can be used) for the duration of the COVID-19 declaration under Section 564(b)(1) of the Act, 21 U.S.C.section 360bbb-3(b)(1), unless the authorization is terminated  or revoked sooner.       Influenza A by PCR NEGATIVE NEGATIVE Final   Influenza B by PCR NEGATIVE NEGATIVE Final    Comment: (NOTE) The Xpert Xpress SARS-CoV-2/FLU/RSV plus assay is intended as an aid in the diagnosis of influenza from Nasopharyngeal swab specimens and should not be used as a sole basis for treatment. Nasal washings and aspirates are unacceptable for Xpert Xpress SARS-CoV-2/FLU/RSV testing.  Fact Sheet for Patients: EntrepreneurPulse.com.au  Fact Sheet for Healthcare Providers: IncredibleEmployment.be  This test is not yet approved or cleared by the [place] FDA and has been authorized for detection and/or diagnosis of SARS-CoV-2 by FDA under an Emergency Use Authorization (EUA). This EUA will remain in effect (meaning this test can be used) for the duration of the COVID-19 declaration under Section 564(b)(1) of the Act, 21 U.S.C. section 360bbb-3(b)(1), unless the authorization is terminated or revoked.  Performed at [facility], [address] [place], [place] [address]     Procedures and diagnostic studies:  No results found.  LOS: 0 days   Daryll Spisak  [facility]   Pager on [email or web address]. If 7PM-7AM, please contact night-coverage at [email or web address]     07/11/2020, 2:06 PM

## 2020-07-11 NOTE — Transfer of Care (Signed)
Immediate Anesthesia Transfer of Care Note  Patient: Troy Lopez  Procedure(s) Performed: ESOPHAGOGASTRODUODENOSCOPY (EGD) WITH PROPOFOL (N/A )  Patient Location: PACU and Endoscopy Unit  Anesthesia Type:General  Level of Consciousness: awake, drowsy and patient cooperative  Airway & Oxygen Therapy: Patient Spontanous Breathing and Patient connected to face mask oxygen  Post-op Assessment: Report given to RN and Post -op Vital signs reviewed and stable  Post vital signs: Reviewed and stable  Last Vitals:  Vitals Value Taken Time  BP 158/86 07/11/20 1602  Temp    Pulse 74 07/11/20 1605  Resp 17 07/11/20 1605  SpO2 97 % 07/11/20 1605  Vitals shown include unvalidated device data.  Last Pain:  Vitals:   07/11/20 1517  TempSrc: Temporal  PainSc: 0-No pain         Complications: No complications documented.

## 2020-07-11 NOTE — Op Note (Signed)
[facility]enterology Patient Name: Troy Lopez Procedure Date: 07/11/2020 2:30 PM MRN: [id number] Account #: [id number] Date of Birth: Apr 04, 1941 Admit Type: Outpatient Age: 79 Room: [facility] ENDO ROOM 4 Gender: Male Note Status: Finalized Procedure:             Upper GI endoscopy Indications:           Melena Providers:             Lucilla Lame MD, MD Medicines:             Propofol per Anesthesia Complications:         No immediate complications. Procedure:             Pre-Anesthesia Assessment:                        - Prior to the procedure, a History and Physical was                         performed, and patient medications and allergies were                         reviewed. The patient's tolerance of previous                         anesthesia was also reviewed. The risks and benefits                         of the procedure and the sedation options and risks                         were discussed with the patient. All questions were                         answered, and informed consent was obtained. Prior                         Anticoagulants: The patient has taken no previous                         anticoagulant or antiplatelet agents. ASA Grade                         Assessment: III - A patient with severe systemic                         disease. After reviewing the risks and benefits, the                         patient was deemed in satisfactory condition to                         undergo the procedure.                        After obtaining informed consent, the endoscope was                         passed under direct vision. Throughout the procedure,  the patient's blood pressure, pulse, and oxygen                         saturations were monitored continuously. The Endoscope                         was introduced through the mouth, and advanced to the                         third part of duodenum. The upper GI  endoscopy was                         accomplished without difficulty. The patient tolerated                         the procedure well. Findings:      A medium-sized hiatal hernia was present.      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 Coagulation for hemostasis using argon plasma at 2       liters/minute and 30 watts was successful.      The examined duodenum was normal. Impression:            - Medium-sized hiatal hernia.                        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 Treated with argon plasma coagulation (APC).                        - Normal examined duodenum.                        - No specimens collected. Recommendation:        - Return patient to hospital ward for ongoing care.                        - Resume previous diet.                        - Continue present medications. Procedure Code(s):     --- Professional ---                        [phone], Esophagogastroduodenoscopy, flexible,                         transoral; with control of bleeding, any method Diagnosis Code(s):     --- Professional ---                        K92.1, Melena (includes Hematochezia)                        K31.819, Angiodysplasia of stomach and duodenum                         without bleeding CPT copyright 2019 American Medical Association. All rights reserved. The codes documented in this report are  preliminary and upon coder review may  be revised to meet current compliance requirements. Lucilla Lame MD, MD 07/11/2020 3:54:33 PM This report has been signed electronically. Number of Addenda: 0 Note Initiated On: 07/11/2020 2:30 PM Estimated Blood Loss:  Estimated blood loss: none.      [facility]

## 2020-07-11 NOTE — OR Nursing (Signed)
Patient c/o severe head ache.  Called Dr Amie Critchley and he ordered 45mcg fentanyl IV.  Given and pt stated pain was easing off in about 15 min.  Vitals stayed stable throughout.

## 2020-07-12 ENCOUNTER — Telehealth: Payer: Self-pay

## 2020-07-12 NOTE — Telephone Encounter (Signed)
Transition Care Management Follow-up Telephone Call  Date of discharge and from where: 07/11/20 from [facility]  How have you been since you were released from the hospital? Appetite decrease since not eating much in the hospital. Encouraged to eat as tolerated, drink water/fluids. Arm is healing nicely! Head hurts when lying down as it did while hospitalized. Head does not hurt when sitting up. Not worsening just continued. Notes MRI was completed in ED. Uses warm rag to relieve headache. Denies shortness of breath. Wheezes. Inhaler in use as directed. No additional follow up needed at this time.   Any questions or concerns? Discuss metoprolol long term side effects at appointment.  Items Reviewed:  Did the pt receive and understand the discharge instructions provided? Yes , increase activity as tolerated.   Medications obtained and verified? Taking medications as scheduled. No issues.  Any new allergies since your discharge? No   Dietary orders reviewed? Yes low sodium/ heart healthy  Do you have support at home? Yes   Home Care and Equipment/Supplies: Were home health services ordered? No  Were any new equipment or medical supplies ordered?  No  Functional Questionnaire: (I = Independent and D = Dependent) ADLs: wife assist as needed.   Bathing/Dressing-  Independent with shower bench  Meal Prep- wife assist  Eating- i  Maintaining continence- Small BM since returning home. Denies bleeding.   Transferring/Ambulation-  Walker in use  Managing Meds- i  Follow up appointments reviewed:   PCP Hospital f/u appt confirmed? Yes  Scheduled to see Dr. Nicki Reaper on 07/21/20 @ 8:00.  Prefers later appointment if possible.  [facility] f/u appt confirmed? None needed at this time.  Are transportation arrangements needed? No   If their condition worsens, is the pt aware to call PCP or go to the Emergency Dept.? Yes  Was the patient provided with contact information for the PCP's  office or ED? Yes  Was to pt encouraged to call back with questions or concerns? Yes

## 2020-07-12 NOTE — Telephone Encounter (Signed)
Confirm doing ok.  I can see him 07/20/20 - 12:00.

## 2020-07-17 ENCOUNTER — Other Ambulatory Visit: Payer: Self-pay | Admitting: *Deleted

## 2020-07-17 ENCOUNTER — Telehealth: Payer: Self-pay | Admitting: *Deleted

## 2020-07-17 ENCOUNTER — Encounter: Payer: Self-pay | Admitting: Gastroenterology

## 2020-07-17 DIAGNOSIS — D509 Iron deficiency anemia, unspecified: Secondary | ICD-10-CM

## 2020-07-17 NOTE — Telephone Encounter (Signed)
I would say yes

## 2020-07-17 NOTE — Telephone Encounter (Signed)
Reviewed note.  Concerned his blood count is low.  Confirm he has had no further bleeding.  Can arrange for labs to recheck cbc and metabolic panel.

## 2020-07-17 NOTE — Telephone Encounter (Signed)
Does the patient need to be scheduled for lab and possible transfusion when he comes to see the NP?  Brooke

## 2020-07-17 NOTE — Telephone Encounter (Signed)
Lab (cbc, hold tube)/NP/possible transfusion please and thank you.

## 2020-07-17 NOTE — Telephone Encounter (Signed)
Patient confirmed no further bleeding. Says he feels about the same. Called hematology and scheduled appt with them. Would like to have labs rechecked at appt on 12/2.

## 2020-07-17 NOTE — Telephone Encounter (Signed)
Patient stated that is doing ok just very weak and he thinks his blood is low. Advised if he is feeling bad he needs to go back to ED. Patient stated he is not going back to the ED. He is going to try to call the cancer center and see if they can see him. I have moved his appt to 12/2 at 12.

## 2020-07-17 NOTE — Telephone Encounter (Signed)
Patient called reporting that he had endoscopy while in the hospital and that his hgb has been down and that he feels worse than he did in hospital. He is asking for a blood transfusion or an infusion of something to hep him feel better. Please advise  Hemoglobin and hematocrit, blood Order: [id number] Status:  Final result Visible to patient:  Yes (seen) Next appt:  07/20/2020 at 12:00 PM in Family Medicine Einar Pheasant, MD)  0 Result Notes  Ref Range & Units 6 d ago  (07/11/20) 6 d ago  (07/11/20) 7 d ago  (07/10/20) 7 d ago  (07/10/20)  Hemoglobin 13.0 - 17.0 g/dL 8.1Low  7.7Low  8.8Low  8.3Low   HCT 39 - 52 % 24.1Low  23.4Low  26.0Low  24.9Low   Comment: Performed at [facility], [address] [place], [place] [address]  Resulting Agency  [facility] CLIN LAB [facility] CLIN LAB [facility] CLIN LAB [facility] CL[facility] LAB      Specimen Collected: 07/11/20 14:10 Last Resulted: 07/11/20 14:21

## 2020-07-17 NOTE — Telephone Encounter (Signed)
I don't we have many chairs this week, but he can come in when we do and see the NP.

## 2020-07-20 ENCOUNTER — Encounter: Payer: Self-pay | Admitting: Internal Medicine

## 2020-07-20 ENCOUNTER — Other Ambulatory Visit: Payer: Self-pay

## 2020-07-20 ENCOUNTER — Ambulatory Visit (INDEPENDENT_AMBULATORY_CARE_PROVIDER_SITE_OTHER): Payer: PPO | Admitting: Internal Medicine

## 2020-07-20 DIAGNOSIS — I10 Essential (primary) hypertension: Secondary | ICD-10-CM

## 2020-07-20 DIAGNOSIS — I4892 Unspecified atrial flutter: Secondary | ICD-10-CM | POA: Diagnosis not present

## 2020-07-20 DIAGNOSIS — W19XXXD Unspecified fall, subsequent encounter: Secondary | ICD-10-CM

## 2020-07-20 DIAGNOSIS — R55 Syncope and collapse: Secondary | ICD-10-CM

## 2020-07-20 DIAGNOSIS — J849 Interstitial pulmonary disease, unspecified: Secondary | ICD-10-CM

## 2020-07-20 DIAGNOSIS — E871 Hypo-osmolality and hyponatremia: Secondary | ICD-10-CM

## 2020-07-20 DIAGNOSIS — K219 Gastro-esophageal reflux disease without esophagitis: Secondary | ICD-10-CM

## 2020-07-20 DIAGNOSIS — I272 Pulmonary hypertension, unspecified: Secondary | ICD-10-CM

## 2020-07-20 DIAGNOSIS — F1029 Alcohol dependence with unspecified alcohol-induced disorder: Secondary | ICD-10-CM

## 2020-07-20 DIAGNOSIS — G629 Polyneuropathy, unspecified: Secondary | ICD-10-CM

## 2020-07-20 DIAGNOSIS — J449 Chronic obstructive pulmonary disease, unspecified: Secondary | ICD-10-CM

## 2020-07-20 DIAGNOSIS — C880 Waldenstrom macroglobulinemia: Secondary | ICD-10-CM | POA: Diagnosis not present

## 2020-07-20 DIAGNOSIS — K921 Melena: Secondary | ICD-10-CM | POA: Diagnosis not present

## 2020-07-20 DIAGNOSIS — D509 Iron deficiency anemia, unspecified: Secondary | ICD-10-CM

## 2020-07-20 DIAGNOSIS — G8929 Other chronic pain: Secondary | ICD-10-CM

## 2020-07-20 DIAGNOSIS — I2510 Atherosclerotic heart disease of native coronary artery without angina pectoris: Secondary | ICD-10-CM | POA: Diagnosis not present

## 2020-07-20 DIAGNOSIS — M549 Dorsalgia, unspecified: Secondary | ICD-10-CM

## 2020-07-20 NOTE — Progress Notes (Signed)
Patient ID: Troy Lopez, male   DOB: 1941-02-17, 79 y.o.   MRN: [id number]   Subjective:    Patient ID: Troy Lopez, male    DOB: 11-Apr-1941, 79 y.o.   MRN: [id number]  HPI This visit occurred during the SARS-CoV-2 public health emergency.  Safety protocols were in place, including screening questions prior to the visit, additional usage of staff PPE, and extensive cleaning of exam room while observing appropriate contact time as indicated for disinfecting solutions.  Patient here for a hospital follow up. He is accompanied by his wife.  History obtained from both of them.   Hospitalized 07/10/20 - 07/11/20 with melena, sob and weakness.  Recent falls.  Was treated with IFVs and IV protonix.  Evaluated by GI.  S/p EGD - medium size hiatal hernia and multiple nonbleeding angiodysplastic lesions in the stomach - treated with argon plasma coagulation.  Did not require blood transfusion.  melana resolved.  hgb discharge - 8.1.  Since being home, he is eating better.  Still weak.  Using a walker to ambulate.  No headache.  No chest pain.  Breathing overall stable.  No vomiting.  Bowels moving.  No blood.  Arm healing.  Forearm wound - oozing some.     Past Medical History:  Diagnosis Date  . Adrenal gland anomaly    enlargement  . Anemia    IDA  . Anxiety   . CAD (coronary artery disease)   . Carpal tunnel syndrome   . Chronic back pain   . Chronic hyponatremia   . Colitis   . Colonic polyp   . COPD (chronic obstructive pulmonary disease) ([place])   . Degenerative disc disease, lumbar   . Depression   . Diverticulosis   . Dyspnea   . Gastric AVM   . GERD (gastroesophageal reflux disease)   . H/O degenerative disc disease   . Hypercholesterolemia   . Hyperkalemia   . Hyperlipidemia   . Hypertension   . Irritable bowel syndrome   . Monoclonal gammopathy   . Monoclonal gammopathy   . Neuropathy   . Personal history of tobacco use, presenting hazards to health 08/17/2015  . Sleep  apnea   . Vertebral compression fracture [facility])    Past Surgical History:  Procedure Laterality Date  . 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/09/2020   Procedure: AMPUTATION TOE;  Surgeon: Sharlotte Alamo, DPM;  Location: [facility] ORS;  Service: Podiatry;  Laterality: Right;  . BUNIONECTOMY  1989  . CARDIAC CATHETERIZATION    . CARPAL TUNNEL RELEASE Left 2011   ulnar nerve sub muscular at elbow  . CHOLECYSTECTOMY  09/07  . COLONOSCOPY WITH PROPOFOL N/A 03/05/2017   Procedure: COLONOSCOPY WITH PROPOFOL;  Surgeon: Lucilla Lame, MD;  Location: [facility] ENDOSCOPY;  Service: Endoscopy;  Laterality: N/A;  . ELECTROPHYSIOLOGIC STUDY N/A 11/15/2015   Procedure: CARDIOVERSION;  Surgeon: Isaias Cowman, MD;  Location: [facility] ORS;  Service: Cardiovascular;  Laterality: N/A;  . ESOPHAGOGASTRODUODENOSCOPY N/A 04/10/2020   Procedure: ESOPHAGOGASTRODUODENOSCOPY (EGD);  Surgeon: Toledo, Benay Pike, MD;  Location: [facility] ENDOSCOPY;  Service: Gastroenterology;  Laterality: N/A;  . ESOPHAGOGASTRODUODENOSCOPY (EGD) WITH PROPOFOL N/A 03/05/2017   Procedure: ESOPHAGOGASTRODUODENOSCOPY (EGD) WITH PROPOFOL;  Surgeon: Lucilla Lame, MD;  Location: [facility] ENDOSCOPY;  Service: Endoscopy;  Laterality: N/A;  . ESOPHAGOGASTRODUODENOSCOPY (EGD) WITH PROPOFOL N/A 10/17/2017   Procedure: ESOPHAGOGASTRODUODENOSCOPY (EGD) WITH PROPOFOL;  Surgeon: Lollie Sails, MD;  Location: [facility] ENDOSCOPY;  Service: Endoscopy;  Laterality: N/A;  . ESOPHAGOGASTRODUODENOSCOPY (EGD) WITH PROPOFOL N/A 07/11/2020  Procedure: ESOPHAGOGASTRODUODENOSCOPY (EGD) WITH PROPOFOL;  Surgeon: Lucilla Lame, MD;  Location: [facility] ENDOSCOPY;  Service: Endoscopy;  Laterality: N/A;  . EYE SURGERY Bilateral 2010   cataract  . HEMORRHOID SURGERY    . KYPHOPLASTY N/A 11/04/2016   Procedure: KYPHOPLASTY T 12;  Surgeon: Hessie Knows, MD;  Location: [facility] ORS;  Service: Orthopedics;  Laterality: N/A;   Family History  Problem Relation Age of Onset  . Stroke Mother   . Heart disease Father         MI - 60   . Colon cancer Neg Hx   . Prostate cancer Neg Hx    Social History   Socioeconomic History  . Marital status: Married    Spouse name: Not on file  . Number of children: 3  . Years of education: Not on file  . Highest education level: Not on file  Occupational History  . Not on file  Tobacco Use  . Smoking status: Former Smoker    Packs/day: 2.00    Years: 58.00    Pack years: 116.00    Types: Cigarettes    Start date: 08/19/1957    Quit date: 11/01/2015    Years since quitting: 4.7  . Smokeless tobacco: Never Used  Vaping Use  . Vaping Use: Never used  Substance and Sexual Activity  . Alcohol use: Yes    Alcohol/week: 42.0 standard drinks    Types: 42 Cans of beer per week    Comment: occas  . Drug use: No  . Sexual activity: Not Currently  Other Topics Concern  . Not on file  Social History Narrative  . Not on file   Social Determinants of Health   Financial Resource Strain: Medium Risk  . Difficulty of Paying Living Expenses: Somewhat hard  Food Insecurity:   . Worried About Charity fundraiser in the Last Year: Not on file  . Ran Out of Food in the Last Year: Not on file  Transportation Needs:   . Lack of Transportation (Medical): Not on file  . Lack of Transportation (Non-Medical): Not on file  Physical Activity:   . Days of Exercise per Week: Not on file  . Minutes of Exercise per Session: Not on file  Stress: Stress Concern Present  . Feeling of Stress : Rather much  Social Connections:   . Frequency of Communication with Friends and Family: Not on file  . Frequency of Social Gatherings with Friends and Family: Not on file  . Attends Religious Services: Not on file  . Active Member of Clubs or Organizations: Not on file  . Attends Archivist Meetings: Not on file  . Marital Status: Not on file    Outpatient Encounter Medications as of 07/20/2020  Medication Sig  . acetaminophen (TYLENOL) 500 MG tablet Take 500-1,000 mg by mouth  3 (three) times daily as needed for moderate pain or headache.  . albuterol (VENTOLIN HFA) 108 (90 Base) MCG/ACT inhaler Inhale 2 puffs into the lungs every 6 (six) hours as needed for wheezing or shortness of breath.  Marland Kitchen azelastine (ASTELIN) 0.1 % nasal spray Place 1 spray into both nostrils 2 (two) times daily. Use in each nostril as directed  . budesonide (ENTOCORT EC) 3 MG 24 hr capsule Take 9 mg by mouth daily as needed (colitis flare).   . calcium-vitamin D (OSCAL WITH D) 500-200 MG-UNIT tablet Take 2 tablets by mouth daily.  Marland Kitchen ELIQUIS 5 MG TABS tablet Take 5 mg by mouth 2 (two)  times daily.   Marland Kitchen FIBER PO Take 1 capsule by mouth daily.   . fluticasone (FLONASE) 50 MCG/ACT nasal spray Place 1 spray into both nostrils daily.  . Fluticasone-Umeclidin-Vilant (TRELEGY ELLIPTA) 100-62.5-25 MCG/INH AEPB Inhale 1 puff into the lungs daily.  . folic acid (FOLVITE) 1 MG tablet Take 1 tablet (1 mg total) by mouth daily. Can take any over-the-counter supplement.  . furosemide (LASIX) 20 MG tablet Take 20 mg by mouth 2 (two) times daily.  Marland Kitchen gabapentin (NEURONTIN) 100 MG capsule Take 4 capsules (400 mg) daily in the morning and midday. Take 8 capsules (800 mg) daily in the evening.  . metoprolol tartrate (LOPRESSOR) 100 MG tablet Take 1 tablet (100 mg total) by mouth 2 (two) times daily.  . ondansetron (ZOFRAN ODT) 4 MG disintegrating tablet Take 1 tablet (4 mg total) by mouth every 8 (eight) hours as needed for nausea or vomiting.  . pantoprazole (PROTONIX) 40 MG tablet Take 1 tablet (40 mg total) by mouth 2 (two) times daily.  . sertraline (ZOLOFT) 50 MG tablet Take 1 tablet (50 mg total) by mouth daily.  Marland Kitchen triamcinolone ointment (KENALOG) 0.1 %   . vitamin B-12 (CYANOCOBALAMIN) 1000 MCG tablet Take 1,000 mcg by mouth daily.   No facility-administered encounter medications on file as of 07/20/2020.    Review of Systems  Constitutional: Positive for fatigue. Negative for unexpected weight change.        Eating better now since discharge.   HENT: Negative for congestion and sinus pressure.   Respiratory: Negative for chest tightness.        Breathing stable.  No increased cough.   Cardiovascular: Negative for chest pain, palpitations and leg swelling.  Gastrointestinal: Negative for abdominal pain, diarrhea, nausea and vomiting.  Genitourinary: Negative for difficulty urinating and dysuria.  Musculoskeletal: Negative for joint swelling and myalgias.  Skin: Negative for color change and rash.       Wounds healing - left arm.   Neurological: Negative for headaches.       No further syncopal episodes.   Psychiatric/Behavioral: Negative for agitation and dysphoric mood.       Objective:    Physical Exam Vitals reviewed.  Constitutional:      General: He is not in acute distress.    Appearance: Normal appearance. He is well-developed.  HENT:     Head: Normocephalic and atraumatic.     Right Ear: External ear normal.     Left Ear: External ear normal.  Eyes:     General: No scleral icterus.       Right eye: No discharge.        Left eye: No discharge.     Conjunctiva/sclera: Conjunctivae normal.  Cardiovascular:     Rate and Rhythm: Normal rate and regular rhythm.  Pulmonary:     Effort: Pulmonary effort is normal. No respiratory distress.     Breath sounds: Normal breath sounds.  Abdominal:     General: Bowel sounds are normal.     Palpations: Abdomen is soft.     Tenderness: There is no abdominal tenderness.  Musculoskeletal:        General: No swelling or tenderness.     Cervical back: Neck supple. No rigidity or tenderness.  Skin:    Findings: No erythema or rash.  Neurological:     Mental Status: He is alert.  Psychiatric:        Mood and Affect: Mood normal.        Behavior:  Behavior normal.     BP 128/78 (BP Location: Left Arm, Patient Position: Sitting)   Pulse 78   Temp 97.7 F (36.5 C)   Ht 5\' 10"  (1.778 m)   Wt 170 lb 6.4 oz (77.3 kg)   SpO2 98%   BMI  24.45 kg/m  Wt Readings from Last 3 Encounters:  07/20/20 170 lb 6.4 oz (77.3 kg)  07/10/20 170 lb (77.1 kg)  07/07/20 175 lb (79.4 kg)     Lab Results  Component Value Date   WBC 8.3 07/11/2020   HGB 8.1 (L) 07/11/2020   HCT 24.1 (L) 07/11/2020   PLT 320 07/11/2020   GLUCOSE 107 (H) 07/11/2020   CHOL 145 01/24/2020   TRIG 56.0 01/24/2020   HDL 68.30 01/24/2020   LDLCALC 65 01/24/2020   ALT 12 07/11/2020   AST 18 07/11/2020   NA 127 (L) 07/11/2020   K 3.5 07/11/2020   CL 95 (L) 07/11/2020   CREATININE 1.13 07/11/2020   BUN 11 07/11/2020   CO2 22 07/11/2020   TSH 1.98 01/24/2020   PSA 1.48 01/24/2020   INR 1.2 07/11/2020   HGBA1C 4.6 01/24/2020   MICROALBUR 1.9 10/27/2017       Assessment & Plan:   Problem List Items Addressed This Visit    [place]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        Syncope    Off blood pressure medication currently.  No further syncopal episodes since discharge.  Monitor blood pressure.  Discussed f/u labs - cbc and electrolytes.  He has a f/u planned with hematology next week. Question of need for blood transfusion.  Wants to hold on having labs today, since being drawn next week.        Relevant Orders   Ambulatory referral to Home Health   Pulmonary hypertension ([place])    Followed by pulmonary and cardiology.       Neuropathy    Stable. On gabapentin.        Moderate COPD (chronic obstructive pulmonary disease) ([facility])    Followed by pulmonary.  Last viist, instructed to start Breztri.  Has ventolin rescue inhaler.  Continue f/u with pulmonary.       Melena    Admitted with melena.  S/p EGD as outlined.  S/p argon plasma coagulation.  No further bleeding.  Needs f/u cbc.  Wanted to wait until he saw hematology to have blood rechecked.        Relevant Orders   Ambulatory referral to Home Health   Iron deficiency anemia    Followed by hematology.  Planned f/u next week.  Needs f/u cbc.  Discussed possible  need for blood transfusion.  Wants to hold on lab recheck today.       Relevant Orders   Ambulatory referral to [place]   ILD (interstitial lung disease) ([place])    Followed by pulmonary.       Hyponatremia    Needs f/u sodium.  History of low sodium - has been relatively stable.  Follow.       Hypertension    He is off blood pressure medication currently.  Blood pressure as outlined.  Will remain off for now.  As blood pressure increases, add back 1/2 metoprolol.  Follow.        GERD (gastroesophageal reflux disease)    No upper symptoms reported.  Continue protonix.       Fall    Recent falls.  Recent admission for  GI bleed.  With persistent weakness.  Using walking.  Discussed PT.  He is agreeable. Home health - PT.        Relevant Orders   Ambulatory referral to [place]   Chronic back pain    PT should help with this as well.       CAD (coronary artery disease)    No chest pain.  Overall breathing appears to be stable.  Follow.        Atrial flutter ([place])    Remains on eliquis.  S/p cardioversion.  Blood pressure as outlined. No on any blood pressure medication now.  Discussed fall risk.  PT to help with weakness.  Will continue eliquis for now.       Alcohol dependence ([place])    Discussed.  States he has cut down the amount he is drinking.  Follow.           Einar Pheasant, MD

## 2020-07-21 ENCOUNTER — Ambulatory Visit: Payer: PPO | Admitting: Internal Medicine

## 2020-07-23 NOTE — Assessment & Plan Note (Signed)
Followed by hematology 

## 2020-07-23 NOTE — Assessment & Plan Note (Signed)
PT should help with this as well.

## 2020-07-23 NOTE — Assessment & Plan Note (Signed)
Needs f/u sodium.  History of low sodium - has been relatively stable.  Follow.

## 2020-07-23 NOTE — Assessment & Plan Note (Signed)
Recent falls.  Recent admission for GI bleed.  With persistent weakness.  Using walking.  Discussed PT.  He is agreeable. Home health - PT.

## 2020-07-23 NOTE — Assessment & Plan Note (Signed)
No chest pain.  Overall breathing appears to be stable.  Follow.

## 2020-07-23 NOTE — Assessment & Plan Note (Signed)
No upper symptoms reported.  Continue protonix.  

## 2020-07-23 NOTE — Assessment & Plan Note (Signed)
Followed by pulmonary and cardiology.  

## 2020-07-23 NOTE — Assessment & Plan Note (Signed)
Admitted with melena.  S/p EGD as outlined.  S/p argon plasma coagulation.  No further bleeding.  Needs f/u cbc.  Wanted to wait until he saw hematology to have blood rechecked.

## 2020-07-23 NOTE — Assessment & Plan Note (Signed)
Followed by pulmonary 

## 2020-07-23 NOTE — Assessment & Plan Note (Signed)
Followed by pulmonary.  Last viist, instructed to start Breztri.  Has ventolin rescue inhaler.  Continue f/u with pulmonary.

## 2020-07-23 NOTE — Assessment & Plan Note (Signed)
Off blood pressure medication currently.  No further syncopal episodes since discharge.  Monitor blood pressure.  Discussed f/u labs - cbc and electrolytes.  He has a f/u planned with hematology next week. Question of need for blood transfusion.  Wants to hold on having labs today, since being drawn next week.

## 2020-07-23 NOTE — Assessment & Plan Note (Signed)
Stable. On gabapentin.  

## 2020-07-23 NOTE — Assessment & Plan Note (Signed)
Followed by hematology.  Planned f/u next week.  Needs f/u cbc.  Discussed possible need for blood transfusion.  Wants to hold on lab recheck today.

## 2020-07-23 NOTE — Assessment & Plan Note (Signed)
Remains on eliquis.  S/p cardioversion.  Blood pressure as outlined. No on any blood pressure medication now.  Discussed fall risk.  PT to help with weakness.  Will continue eliquis for now.

## 2020-07-23 NOTE — Assessment & Plan Note (Signed)
He is off blood pressure medication currently.  Blood pressure as outlined.  Will remain off for now.  As blood pressure increases, add back 1/2 metoprolol.  Follow.

## 2020-07-23 NOTE — Assessment & Plan Note (Signed)
Discussed.  States he has cut down the amount he is drinking.  Follow.

## 2020-07-26 ENCOUNTER — Inpatient Hospital Stay: Payer: PPO | Attending: Oncology

## 2020-07-26 ENCOUNTER — Inpatient Hospital Stay (HOSPITAL_BASED_OUTPATIENT_CLINIC_OR_DEPARTMENT_OTHER): Payer: PPO | Admitting: Oncology

## 2020-07-26 ENCOUNTER — Inpatient Hospital Stay: Payer: PPO

## 2020-07-26 ENCOUNTER — Encounter: Payer: Self-pay | Admitting: Oncology

## 2020-07-26 VITALS — BP 143/63 | HR 111 | Temp 96.7°F | Wt 177.1 lb

## 2020-07-26 DIAGNOSIS — D649 Anemia, unspecified: Secondary | ICD-10-CM

## 2020-07-26 DIAGNOSIS — F329 Major depressive disorder, single episode, unspecified: Secondary | ICD-10-CM | POA: Diagnosis not present

## 2020-07-26 DIAGNOSIS — D509 Iron deficiency anemia, unspecified: Secondary | ICD-10-CM

## 2020-07-26 DIAGNOSIS — J449 Chronic obstructive pulmonary disease, unspecified: Secondary | ICD-10-CM | POA: Insufficient documentation

## 2020-07-26 DIAGNOSIS — E7800 Pure hypercholesterolemia, unspecified: Secondary | ICD-10-CM | POA: Diagnosis not present

## 2020-07-26 DIAGNOSIS — G4730 Sleep apnea, unspecified: Secondary | ICD-10-CM | POA: Insufficient documentation

## 2020-07-26 DIAGNOSIS — Z79899 Other long term (current) drug therapy: Secondary | ICD-10-CM | POA: Insufficient documentation

## 2020-07-26 DIAGNOSIS — Z87891 Personal history of nicotine dependence: Secondary | ICD-10-CM | POA: Insufficient documentation

## 2020-07-26 DIAGNOSIS — I2510 Atherosclerotic heart disease of native coronary artery without angina pectoris: Secondary | ICD-10-CM | POA: Insufficient documentation

## 2020-07-26 DIAGNOSIS — K589 Irritable bowel syndrome without diarrhea: Secondary | ICD-10-CM | POA: Diagnosis not present

## 2020-07-26 DIAGNOSIS — Z7901 Long term (current) use of anticoagulants: Secondary | ICD-10-CM | POA: Insufficient documentation

## 2020-07-26 DIAGNOSIS — R0602 Shortness of breath: Secondary | ICD-10-CM | POA: Diagnosis not present

## 2020-07-26 DIAGNOSIS — K449 Diaphragmatic hernia without obstruction or gangrene: Secondary | ICD-10-CM | POA: Insufficient documentation

## 2020-07-26 DIAGNOSIS — R5383 Other fatigue: Secondary | ICD-10-CM | POA: Diagnosis not present

## 2020-07-26 DIAGNOSIS — E785 Hyperlipidemia, unspecified: Secondary | ICD-10-CM | POA: Insufficient documentation

## 2020-07-26 DIAGNOSIS — K219 Gastro-esophageal reflux disease without esophagitis: Secondary | ICD-10-CM | POA: Diagnosis not present

## 2020-07-26 DIAGNOSIS — F419 Anxiety disorder, unspecified: Secondary | ICD-10-CM | POA: Diagnosis not present

## 2020-07-26 DIAGNOSIS — Z86010 Personal history of colonic polyps: Secondary | ICD-10-CM | POA: Insufficient documentation

## 2020-07-26 DIAGNOSIS — C880 Waldenstrom macroglobulinemia: Secondary | ICD-10-CM | POA: Diagnosis not present

## 2020-07-26 DIAGNOSIS — M47812 Spondylosis without myelopathy or radiculopathy, cervical region: Secondary | ICD-10-CM | POA: Insufficient documentation

## 2020-07-26 DIAGNOSIS — I10 Essential (primary) hypertension: Secondary | ICD-10-CM | POA: Diagnosis not present

## 2020-07-26 DIAGNOSIS — M4312 Spondylolisthesis, cervical region: Secondary | ICD-10-CM | POA: Insufficient documentation

## 2020-07-26 LAB — CBC WITH DIFFERENTIAL/PLATELET
Abs Immature Granulocytes: 0.04 10*3/uL (ref 0.00–0.07)
Basophils Absolute: 0 10*3/uL (ref 0.0–0.1)
Basophils Relative: 1 %
Eosinophils Absolute: 0 10*3/uL (ref 0.0–0.5)
Eosinophils Relative: 0 %
HCT: 23.9 % — ABNORMAL LOW (ref 39.0–52.0)
Hemoglobin: 7.8 g/dL — ABNORMAL LOW (ref 13.0–17.0)
Immature Granulocytes: 1 %
Lymphocytes Relative: 13 %
Lymphs Abs: 1.1 10*3/uL (ref 0.7–4.0)
MCH: 35.5 pg — ABNORMAL HIGH (ref 26.0–34.0)
MCHC: 32.6 g/dL (ref 30.0–36.0)
MCV: 108.6 fL — ABNORMAL HIGH (ref 80.0–100.0)
Monocytes Absolute: 0.5 10*3/uL (ref 0.1–1.0)
Monocytes Relative: 5 %
Neutro Abs: 7.1 10*3/uL (ref 1.7–7.7)
Neutrophils Relative %: 80 %
Platelets: 376 10*3/uL (ref 150–400)
RBC: 2.2 MIL/uL — ABNORMAL LOW (ref 4.22–5.81)
RDW: 15.9 % — ABNORMAL HIGH (ref 11.5–15.5)
WBC: 8.7 10*3/uL (ref 4.0–10.5)
nRBC: 0 % (ref 0.0–0.2)

## 2020-07-26 LAB — IRON AND TIBC
Iron: 46 ug/dL (ref 45–182)
Saturation Ratios: 5 % — ABNORMAL LOW (ref 17.9–39.5)
TIBC: 967 ug/dL — ABNORMAL HIGH (ref 250–450)
UIBC: 921 ug/dL

## 2020-07-26 LAB — PREPARE RBC (CROSSMATCH)

## 2020-07-26 LAB — BASIC METABOLIC PANEL
Anion gap: 13 (ref 5–15)
BUN: 19 mg/dL (ref 8–23)
CO2: 18 mmol/L — ABNORMAL LOW (ref 22–32)
Calcium: 9.5 mg/dL (ref 8.9–10.3)
Chloride: 97 mmol/L — ABNORMAL LOW (ref 98–111)
Creatinine, Ser: 1.36 mg/dL — ABNORMAL HIGH (ref 0.61–1.24)
GFR, Estimated: 53 mL/min — ABNORMAL LOW (ref >60)
Glucose, Bld: 146 mg/dL — ABNORMAL HIGH (ref 70–99)
Potassium: 4.1 mmol/L (ref 3.5–5.1)
Sodium: 128 mmol/L — ABNORMAL LOW (ref 135–145)

## 2020-07-26 LAB — FERRITIN: Ferritin: 28 ng/mL (ref 24–336)

## 2020-07-26 MED ORDER — SODIUM CHLORIDE 0.9% IV SOLUTION
250.0000 mL | Freq: Once | INTRAVENOUS | Status: AC
  Administered 2020-07-26: 250 mL via INTRAVENOUS
  Filled 2020-07-26: qty 250

## 2020-07-26 MED ORDER — DIPHENHYDRAMINE HCL 25 MG PO CAPS
25.0000 mg | ORAL_CAPSULE | Freq: Once | ORAL | Status: AC
  Administered 2020-07-26: 25 mg via ORAL
  Filled 2020-07-26: qty 1

## 2020-07-26 MED ORDER — ACETAMINOPHEN 325 MG PO TABS
650.0000 mg | ORAL_TABLET | Freq: Once | ORAL | Status: AC
  Administered 2020-07-26: 650 mg via ORAL
  Filled 2020-07-26: qty 2

## 2020-07-26 NOTE — Progress Notes (Signed)
[place]  Telephone:([phone]) [phone] Fax:(336) [phone]  ID: Troy Lopez OB: March 24, 1941  MR#: [id number]  EXB#:[id number]  Patient Care Team: Einar Pheasant, MD as PCP - General (Internal Medicine) Lollie Sails, MD (Inactive) as Consulting Physician (Gastroenterology) Ok Edwards, NP as Nurse Practitioner (Gastroenterology) Lloyd Huger, MD as Consulting Physician (Oncology) [name], RPH-CPP as Pharmacist (Pharmacist)  CHIEF COMPLAINT: [name]'s macroglobulinemia, iron deficiency anemia.  INTERVAL HISTORY: Patient presents today as an add-on for worsening shortness of breath and fatigue.   He was evaluated last in hematology on 06/02/2020 by Dr. Grayland Ormond and received IV iron.  In the interim, he was seen in the [facility] ED (07/05/20) for a fall injuring his head.  CT scan of his head was negative and he had mild hyponatremia.  He was evaluated again and admitted from 07/10/20-07/11/20 for GI bleed.  Had EGD with Dr. 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  He was not given any IV iron or blood products.  He presents today with acute onset shortness of breath and fatigue that started right after Thanksgiving.  Patient's wife reports he is unable to walk from the bedroom to the bathroom without significant shortness of breath requiring him to stop and sit down.  He states he feels "horrible".  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  He denies any recent bleeding.  He has not fallen in the past several weeks. He denies any recent fevers or illnesses. He has a good appetite and denies weight loss.  He denies any chest pain, cough, or hemoptysis.  He denies any nausea, vomiting, constipation, or diarrhea.  He denies any melena or hematochezia.  He has no urinary complaints.  Patient offers no further specific complaints  today.  REVIEW OF SYSTEMS:   Review of Systems  Constitutional: Positive for malaise/fatigue. Negative for fever and weight loss.  Respiratory: Positive for shortness of breath. Negative for cough and hemoptysis.   Cardiovascular: Negative.  Negative for chest pain and leg swelling.  Gastrointestinal: Negative.  Negative for abdominal pain, blood in stool, diarrhea and melena.  Genitourinary: Negative.  Negative for hematuria.  Musculoskeletal: Negative.  Negative for back pain and falls.  Skin: Negative.  Negative for rash.  Neurological: Positive for weakness. Negative for dizziness, sensory change, focal weakness and headaches.  Psychiatric/Behavioral: The patient is not nervous/anxious and does not have insomnia.     As per HPI. Otherwise, a complete review of systems is negative.  PAST MEDICAL HISTORY: Past Medical History:  Diagnosis Date  . Adrenal gland anomaly    enlargement  . Anemia    IDA  . Anxiety   . CAD (coronary artery disease)   . Carpal tunnel syndrome   . Chronic back pain   . Chronic hyponatremia   . Colitis   . Colonic polyp   . COPD (chronic obstructive pulmonary disease) ([place])   . Degenerative disc disease, lumbar   . Depression   . Diverticulosis   . Dyspnea   . Gastric AVM   . GERD (gastroesophageal reflux disease)   . H/O degenerative disc disease   . Hypercholesterolemia   . Hyperkalemia   . Hyperlipidemia   . Hypertension   . Irritable bowel syndrome   . Monoclonal gammopathy   . Monoclonal gammopathy   . Neuropathy   . Personal history of tobacco use, presenting hazards to health  08/17/2015  . Sleep apnea   . Vertebral compression fracture ([place])     PAST SURGICAL HISTORY: Past Surgical History:  Procedure Laterality Date  . 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/09/2020   Procedure: AMPUTATION TOE;  Surgeon: Sharlotte Alamo, DPM;  Location: [facility] ORS;  Service: Podiatry;  Laterality: Right;  . BUNIONECTOMY  1989  . CARDIAC CATHETERIZATION    .  CARPAL TUNNEL RELEASE Left 2011   ulnar nerve sub muscular at elbow  . CHOLECYSTECTOMY  09/07  . COLONOSCOPY WITH PROPOFOL N/A 03/05/2017   Procedure: COLONOSCOPY WITH PROPOFOL;  Surgeon: Lucilla Lame, MD;  Location: [facility] ENDOSCOPY;  Service: Endoscopy;  Laterality: N/A;  . ELECTROPHYSIOLOGIC STUDY N/A 11/15/2015   Procedure: CARDIOVERSION;  Surgeon: Isaias Cowman, MD;  Location: [facility] ORS;  Service: Cardiovascular;  Laterality: N/A;  . ESOPHAGOGASTRODUODENOSCOPY N/A 04/10/2020   Procedure: ESOPHAGOGASTRODUODENOSCOPY (EGD);  Surgeon: Toledo, Benay Pike, MD;  Location: [facility] ENDOSCOPY;  Service: Gastroenterology;  Laterality: N/A;  . ESOPHAGOGASTRODUODENOSCOPY (EGD) WITH PROPOFOL N/A 03/05/2017   Procedure: ESOPHAGOGASTRODUODENOSCOPY (EGD) WITH PROPOFOL;  Surgeon: Lucilla Lame, MD;  Location: [facility] ENDOSCOPY;  Service: Endoscopy;  Laterality: N/A;  . ESOPHAGOGASTRODUODENOSCOPY (EGD) WITH PROPOFOL N/A 10/17/2017   Procedure: ESOPHAGOGASTRODUODENOSCOPY (EGD) WITH PROPOFOL;  Surgeon: Lollie Sails, MD;  Location: [facility] ENDOSCOPY;  Service: Endoscopy;  Laterality: N/A;  . ESOPHAGOGASTRODUODENOSCOPY (EGD) WITH PROPOFOL N/A 07/11/2020   Procedure: ESOPHAGOGASTRODUODENOSCOPY (EGD) WITH PROPOFOL;  Surgeon: Lucilla Lame, MD;  Location: [facility] ENDOSCOPY;  Service: Endoscopy;  Laterality: N/A;  . EYE SURGERY Bilateral 2010   cataract  . HEMORRHOID SURGERY    . KYPHOPLASTY N/A 11/04/2016   Procedure: KYPHOPLASTY T 12;  Surgeon: Hessie Knows, MD;  Location: [facility] ORS;  Service: Orthopedics;  Laterality: N/A;    FAMILY HISTORY Family History  Problem Relation Age of Onset  . Stroke Mother   . Heart disease Father        MI - 20   . Colon cancer Neg Hx   . Prostate cancer Neg Hx        ADVANCED DIRECTIVES:    HEALTH MAINTENANCE: Social History   Tobacco Use  . Smoking status: Former Smoker    Packs/day: 2.00    Years: 58.00    Pack years: 116.00    Types: Cigarettes    Start date: 08/19/1957     Quit date: 11/01/2015    Years since quitting: 4.7  . Smokeless tobacco: Never Used  Vaping Use  . Vaping Use: Never used  Substance Use Topics  . Alcohol use: Yes    Alcohol/week: 42.0 standard drinks    Types: 42 Cans of beer per week    Comment: occas  . Drug use: No     Colonoscopy:  PAP:  Bone density:  Lipid panel:  Allergies  Allergen Reactions  . Iodinated Diagnostic Agents Other (See Comments)    Contraindication secondary to IGMgammopathy/Waldren's syndrome   Not to be given due to Waldenstrom's syndrome, told it could affect kidney function    Current Outpatient Medications  Medication Sig Dispense Refill  . acetaminophen (TYLENOL) 500 MG tablet Take 500-1,000 mg by mouth 3 (three) times daily as needed for moderate pain or headache.    . albuterol (VENTOLIN HFA) 108 (90 Base) MCG/ACT inhaler Inhale 2 puffs into the lungs every 6 (six) hours as needed for wheezing or shortness of breath. 18 g 11  . azelastine (ASTELIN) 0.1 % nasal spray Place 1 spray into both nostrils 2 (two) times daily. Use in each nostril as  directed 30 mL 1  . budesonide (ENTOCORT EC) 3 MG 24 hr capsule Take 9 mg by mouth daily as needed (colitis flare).     . calcium-vitamin D (OSCAL WITH D) 500-200 MG-UNIT tablet Take 2 tablets by mouth daily.    Marland Kitchen ELIQUIS 5 MG TABS tablet Take 5 mg by mouth 2 (two) times daily.     Marland Kitchen FIBER PO Take 1 capsule by mouth daily.     . fluticasone (FLONASE) 50 MCG/ACT nasal spray Place 1 spray into both nostrils daily. 16 g 2  . Fluticasone-Umeclidin-Vilant (TRELEGY ELLIPTA) 100-62.5-25 MCG/INH AEPB Inhale 1 puff into the lungs daily. 1 each 11  . folic acid (FOLVITE) 1 MG tablet Take 1 tablet (1 mg total) by mouth daily. Can take any over-the-counter supplement.    . furosemide (LASIX) 20 MG tablet Take 20 mg by mouth 2 (two) times daily.    Marland Kitchen gabapentin (NEURONTIN) 100 MG capsule Take 4 capsules (400 mg) daily in the morning and midday. Take 8 capsules (800  mg) daily in the evening. 480 capsule 0  . metoprolol tartrate (LOPRESSOR) 100 MG tablet Take 1 tablet (100 mg total) by mouth 2 (two) times daily. 60 tablet 0  . ondansetron (ZOFRAN ODT) 4 MG disintegrating tablet Take 1 tablet (4 mg total) by mouth every 8 (eight) hours as needed for nausea or vomiting. 12 tablet 0  . pantoprazole (PROTONIX) 40 MG tablet Take 1 tablet (40 mg total) by mouth 2 (two) times daily. 180 tablet 0  . sertraline (ZOLOFT) 50 MG tablet Take 1 tablet (50 mg total) by mouth daily. 30 tablet 0  . triamcinolone ointment (KENALOG) 0.1 %     . vitamin B-12 (CYANOCOBALAMIN) 1000 MCG tablet Take 1,000 mcg by mouth daily.     No current facility-administered medications for this visit.    OBJECTIVE: There were no vitals filed for this visit.   There is no height or weight on file to calculate BMI.    ECOG FS:1 - Symptomatic but completely ambulatory  Physical Exam Constitutional:      Appearance: Normal appearance.  HENT:     Head: Normocephalic and atraumatic.  Eyes:     Pupils: Pupils are equal, round, and reactive to light.  Cardiovascular:     Rate and Rhythm: Normal rate and regular rhythm.     Heart sounds: Normal heart sounds. No murmur heard.   Pulmonary:     Effort: Pulmonary effort is normal.     Breath sounds: Normal breath sounds. No wheezing.  Abdominal:     General: Bowel sounds are normal. There is no distension.     Palpations: Abdomen is soft.     Tenderness: There is no abdominal tenderness.  Musculoskeletal:        General: Normal range of motion.     Cervical back: Normal range of motion.  Skin:    General: Skin is warm and dry.     Findings: No rash.  Neurological:     Mental Status: He is alert and oriented to person, place, and time.  Psychiatric:        Judgment: Judgment normal.      LAB RESULTS:  Lab Results  Component Value Date   NA 127 (L) 07/11/2020   K 3.5 07/11/2020   CL 95 (L) 07/11/2020   CO2 22 07/11/2020    GLUCOSE 107 (H) 07/11/2020   BUN 11 07/11/2020   CREATININE 1.13 07/11/2020   CALCIUM 9.1 07/11/2020  PROT 7.3 07/11/2020   ALBUMIN 3.2 (L) 07/11/2020   AST 18 07/11/2020   ALT 12 07/11/2020   ALKPHOS 57 07/11/2020   BILITOT 0.7 07/11/2020   GFRNONAA >60 07/11/2020   GFRAA >60 04/11/2020    Lab Results  Component Value Date   WBC 8.3 07/11/2020   NEUTROABS 5.6 07/10/2020   HGB 8.1 (L) 07/11/2020   HCT 24.1 (L) 07/11/2020   MCV 109.9 (H) 07/11/2020   PLT 320 07/11/2020   Lab Results  Component Value Date   IRON 89 06/02/2020   TIBC 906 (H) 06/02/2020   IRONPCTSAT 10 (L) 06/02/2020    Lab Results  Component Value Date   FERRITIN 23 (L) 06/02/2020   Lab Results  Component Value Date   TOTALPROTELP 8.2 10/19/2019   ALBUMINELP 3.4 10/19/2019   A1GS 0.3 10/19/2019   A2GS 0.6 10/19/2019   BETS 1.6 (H) 10/19/2019   BETA2SER 0.2 02/14/2016   GAMS 2.3 (H) 10/19/2019   MSPIKE Not Observed 10/19/2019   SPEI Comment 10/19/2019    STUDIES: CT Head Wo Contrast  Result Date: 07/05/2020 CLINICAL DATA:  Head trauma, moderate/severe. Poly trauma, critical, head/cervical spine injury suspected. Additional history provided: Fall yesterday with loss of consciousness, multiple recent falls, patient taking Eliquis. EXAM: CT HEAD WITHOUT CONTRAST CT CERVICAL SPINE WITHOUT CONTRAST TECHNIQUE: Multidetector CT imaging of the head and cervical spine was performed following the standard protocol without intravenous contrast. Multiplanar CT image reconstructions of the cervical spine were also generated. COMPARISON:  Head CT 10/30/2017.  Brain MRI 11/10/2014. FINDINGS: CT HEAD FINDINGS Brain: Moderate generalized cerebral atrophy. Advanced ill-defined hypoattenuation within the cerebral white matter is nonspecific, but compatible with chronic small vessel ischemic disease. There is no acute intracranial hemorrhage. No demarcated cortical infarct. No extra-axial fluid collection. No evidence of  intracranial mass. No midline shift. Vascular: No hyperdense vessel.  Atherosclerotic calcifications. Skull: Normal. Negative for fracture or focal lesion. Sinuses/Orbits: Visualized orbits show no acute finding. 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 Small amount of frothy secretions within the right frontal sinus. Minimal ethmoid sinus mucosal thickening. CT CERVICAL SPINE FINDINGS Alignment: 2 mm C3-C4 grade 1 anterolisthesis. Trace C5-C6 grade 1 anterolisthesis. Skull base and vertebrae: The basion-dental and atlanto-dental intervals are maintained.No evidence of acute fracture to the cervical spine. Soft tissues and spinal canal: No prevertebral fluid or swelling. No visible canal hematoma. Disc levels: Cervical spondylosis with multilevel disc space narrowing, posterior disc osteophytes, uncovertebral hypertrophy and facet arthrosis. Disc space narrowing is moderate to moderately advanced at C4-C5, C5-C6 and C6-C7. Upper chest: Emphysema within the imaged lung apices. No visible airspace consolidation or pneumothorax. Other: Incidentally noted cervical ribs. IMPRESSION: CT head: 1. No evidence of acute intracranial abnormality. 2. Moderate cerebral atrophy and advanced chronic small vessel ischemic disease. 3. 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 Small volume frothy secretions are also present within the right frontal sinus suggestive of sinusitis. CT cervical spine: 1. No evidence of acute fracture to the cervical spine. 2. C3-C4 and C5-C6 grade 1 anterolisthesis. 3. Cervical spondylosis as described. 4. Emphysema (ICD10-J43.9). Electronically Signed   By: Kellie Simmering DO   On: 07/05/2020 15:06   CT Cervical Spine Wo Contrast  Result Date: 07/05/2020 CLINICAL DATA:  Head trauma, moderate/severe. Poly trauma, critical, head/cervical spine injury suspected. Additional history provided: Fall yesterday with loss  of consciousness, multiple recent falls, patient taking Eliquis.  EXAM: CT HEAD WITHOUT CONTRAST CT CERVICAL SPINE WITHOUT CONTRAST TECHNIQUE: Multidetector CT imaging of the head and cervical spine was performed following the standard protocol without intravenous contrast. Multiplanar CT image reconstructions of the cervical spine were also generated. COMPARISON:  Head CT 10/30/2017.  Brain MRI 11/10/2014. FINDINGS: CT HEAD FINDINGS Brain: Moderate generalized cerebral atrophy. Advanced ill-defined hypoattenuation within the cerebral white matter is nonspecific, but compatible with chronic small vessel ischemic disease. There is no acute intracranial hemorrhage. No demarcated cortical infarct. No extra-axial fluid collection. No evidence of intracranial mass. No midline shift. Vascular: No hyperdense vessel.  Atherosclerotic calcifications. Skull: Normal. Negative for fracture or focal lesion. Sinuses/Orbits: Visualized orbits show no acute finding. 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 Small amount of frothy secretions within the right frontal sinus. Minimal ethmoid sinus mucosal thickening. CT CERVICAL SPINE FINDINGS Alignment: 2 mm C3-C4 grade 1 anterolisthesis. Trace C5-C6 grade 1 anterolisthesis. Skull base and vertebrae: The basion-dental and atlanto-dental intervals are maintained.No evidence of acute fracture to the cervical spine. Soft tissues and spinal canal: No prevertebral fluid or swelling. No visible canal hematoma. Disc levels: Cervical spondylosis with multilevel disc space narrowing, posterior disc osteophytes, uncovertebral hypertrophy and facet arthrosis. Disc space narrowing is moderate to moderately advanced at C4-C5, C5-C6 and C6-C7. Upper chest: Emphysema within the imaged lung apices. No visible airspace consolidation or pneumothorax. Other: Incidentally noted cervical ribs. IMPRESSION: CT head: 1. No evidence of acute intracranial  abnormality. 2. Moderate cerebral atrophy and advanced chronic small vessel ischemic disease. 3. 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 Small volume frothy secretions are also present within the right frontal sinus suggestive of sinusitis. CT cervical spine: 1. No evidence of acute fracture to the cervical spine. 2. C3-C4 and C5-C6 grade 1 anterolisthesis. 3. Cervical spondylosis as described. 4. Emphysema (ICD10-J43.9). Electronically Signed   By: Kellie Simmering DO   On: 07/05/2020 15:06   Oncology history: [name]'s macroglobulinemia:  Bone marrow biopsy results from Jan 08, 2017 reviewed independently confirming underlying [name]'s.  Patient's M spike has been erratic ranging from 1.1 in May 2018 to as high as 2.6 in October 2018.  His most recent results on October 19, 2019 did not reveal an M spike, but continued to have a persistently elevated IgM level of 3755.  Kappa free light chains are essentially unchanged at 75.3. He has persistent iron deficiency anemia from a GI source, but no other evidence of endorgan damage. He does not require any treatment at this time. If he had progression of disease as evidenced by B-symptoms or endorgan damage, will consider Rituxan-based therapy.  Continue to monitor every 3 to 6 months.   ASSESSMENT: [name]'s macroglobulinemia, iron deficiency anemia.  PLAN:    1.  Waldenstrom's macroglobulinemia:  -Currently under surveillance. -SPEP from 10/19/19 showed an undetectable M spike but elevation in IgM level of 3755.  Kappa free light chains remained unchanged at 75.3. -Plan is to repeat with labs and assessment every 3 to 6 months. -If evidence of B symptoms or endorgan damage he likely would begin treatment with Rituxan.  2. Iron deficiency anemia:  -Secondary to GI source. -Recently had EGD which showed an additional angiodysplastic nonbleeding lesion which was treated with argon plasma coagulation. -No active  bleed at this time. -Lab work from today shows hemoglobin of 7.8, Ferritin is 28 and iron saturation is 5%. -Given he is symptomatic with  significant shortness of breath and fatigue, would recommend 1 unit packed red blood cells. -He is already scheduled to receive additional IV iron on 08/07/2020.  We will keep this appointment.  3.  Shortness of breath:  -Secondary to anemia and underlying COPD. -He is receiving 1 unit packed red blood cells which hopefully will help with this symptom.  Disposition: -Return to clinic in approximately 1 week for lab work (CBC) and IV iron.  Greater than 50% was spent in counseling and coordination of care with this patient including but not limited to discussion of the relevant topics above (See A&P) including, but not limited to diagnosis and management of acute and chronic medical conditions.   Patient expressed understanding and was in agreement with this plan. He also understands that He can call clinic at any time with any questions, concerns, or complaints.    Jacquelin Hawking, NP   07/26/2020 8:56 AM

## 2020-07-26 NOTE — Progress Notes (Signed)
1415- Patient tolerated blood transfusion well. Patient stable and discharged to home at this time.

## 2020-07-27 DIAGNOSIS — G629 Polyneuropathy, unspecified: Secondary | ICD-10-CM | POA: Diagnosis not present

## 2020-07-27 DIAGNOSIS — I2510 Atherosclerotic heart disease of native coronary artery without angina pectoris: Secondary | ICD-10-CM | POA: Diagnosis not present

## 2020-07-27 DIAGNOSIS — Z7951 Long term (current) use of inhaled steroids: Secondary | ICD-10-CM | POA: Diagnosis not present

## 2020-07-27 DIAGNOSIS — K219 Gastro-esophageal reflux disease without esophagitis: Secondary | ICD-10-CM | POA: Diagnosis not present

## 2020-07-27 DIAGNOSIS — I4892 Unspecified atrial flutter: Secondary | ICD-10-CM | POA: Diagnosis not present

## 2020-07-27 DIAGNOSIS — D509 Iron deficiency anemia, unspecified: Secondary | ICD-10-CM | POA: Diagnosis not present

## 2020-07-27 DIAGNOSIS — G4730 Sleep apnea, unspecified: Secondary | ICD-10-CM | POA: Diagnosis not present

## 2020-07-27 DIAGNOSIS — M810 Age-related osteoporosis without current pathological fracture: Secondary | ICD-10-CM | POA: Diagnosis not present

## 2020-07-27 DIAGNOSIS — K5900 Constipation, unspecified: Secondary | ICD-10-CM | POA: Diagnosis not present

## 2020-07-27 DIAGNOSIS — I10 Essential (primary) hypertension: Secondary | ICD-10-CM | POA: Diagnosis not present

## 2020-07-27 DIAGNOSIS — M5116 Intervertebral disc disorders with radiculopathy, lumbar region: Secondary | ICD-10-CM | POA: Diagnosis not present

## 2020-07-27 DIAGNOSIS — J449 Chronic obstructive pulmonary disease, unspecified: Secondary | ICD-10-CM | POA: Diagnosis not present

## 2020-07-27 DIAGNOSIS — Z89421 Acquired absence of other right toe(s): Secondary | ICD-10-CM | POA: Diagnosis not present

## 2020-07-27 DIAGNOSIS — G894 Chronic pain syndrome: Secondary | ICD-10-CM | POA: Diagnosis not present

## 2020-07-27 DIAGNOSIS — Z9181 History of falling: Secondary | ICD-10-CM | POA: Diagnosis not present

## 2020-07-27 DIAGNOSIS — I270 Primary pulmonary hypertension: Secondary | ICD-10-CM | POA: Diagnosis not present

## 2020-07-27 DIAGNOSIS — Z87891 Personal history of nicotine dependence: Secondary | ICD-10-CM | POA: Diagnosis not present

## 2020-07-27 DIAGNOSIS — Z7901 Long term (current) use of anticoagulants: Secondary | ICD-10-CM | POA: Diagnosis not present

## 2020-07-27 LAB — KAPPA/LAMBDA LIGHT CHAINS
Kappa free light chain: 87.9 mg/L — ABNORMAL HIGH (ref 3.3–19.4)
Kappa, lambda light chain ratio: 3.49 — ABNORMAL HIGH (ref 0.26–1.65)
Lambda free light chains: 25.2 mg/L (ref 5.7–26.3)

## 2020-07-27 LAB — IGG, IGA, IGM
IgA: 40 mg/dL — ABNORMAL LOW (ref 61–437)
IgG (Immunoglobin G), Serum: 360 mg/dL — ABNORMAL LOW (ref 603–1613)
IgM (Immunoglobulin M), Srm: 7828 mg/dL — ABNORMAL HIGH (ref 15–143)

## 2020-07-27 LAB — TYPE AND SCREEN
ABO/RH(D): A POS
Antibody Screen: NEGATIVE
Unit division: 0

## 2020-07-27 LAB — BPAM RBC
Blood Product Expiration Date: 202201042359
ISSUE DATE / TIME: 202112081216
Unit Type and Rh: 6200

## 2020-07-31 ENCOUNTER — Other Ambulatory Visit: Payer: Self-pay | Admitting: Internal Medicine

## 2020-07-31 ENCOUNTER — Telehealth: Payer: Self-pay | Admitting: Internal Medicine

## 2020-07-31 NOTE — Telephone Encounter (Signed)
Is the spot on Tuesday 12:00 taken?  (I thought there was someone to put in that spot).  If no, then see if Troy Lopez can come in Tuesday 12:00 or Wednesday 12:00.

## 2020-07-31 NOTE — Telephone Encounter (Signed)
Pt wife called and said that he had a blood  transfusion  On 07/26/20  He is not eating or sleeping and has no energy  Please call [phone]

## 2020-07-31 NOTE — Telephone Encounter (Signed)
Patient has been eating really well at night. Patient was very nauseated and very tired but cant sleep. Vthey have found him slumped forward asleep. He can barely walked. Patient has a walker now to get around, Patient has no energy and doesn't have the strength to move. He has chest congestion, the mucinex is not working too much but he is able to get the phlegm up sometimes. They feel like the blood transfusion did not work as great for the energy as it did previous. They are wanting to know is there anything that can make him feel better overall. Please advise. Patient wants to see Dr Nicki Reaper. No Urgent Care or ED.Everything has been refused. Patient only wants to see Dr Nicki Reaper.Troy Lopez

## 2020-07-31 NOTE — Telephone Encounter (Signed)
Informed patient, he was not willing to wait but he will wait until Thursday. I have scheduled an appointment with Dr Derrel Nip at 0830 this Thursday. He stated if PCP can see him he would like that better. Will not go to ED/UC no matter what.

## 2020-07-31 NOTE — Telephone Encounter (Signed)
Can you please triage him and see what is going on. He has been sick but need to confirm nothing acute.

## 2020-07-31 NOTE — Telephone Encounter (Signed)
We can schedule him a follow up appt, but confirm he is ok to wait for appt.  I will also review his transfusion, etc and see if anything more we can do.

## 2020-08-01 ENCOUNTER — Ambulatory Visit (INDEPENDENT_AMBULATORY_CARE_PROVIDER_SITE_OTHER): Payer: PPO | Admitting: Internal Medicine

## 2020-08-01 ENCOUNTER — Ambulatory Visit: Payer: PPO | Admitting: Pharmacist

## 2020-08-01 ENCOUNTER — Other Ambulatory Visit: Payer: Self-pay

## 2020-08-01 ENCOUNTER — Telehealth: Payer: Self-pay

## 2020-08-01 VITALS — BP 158/62 | HR 89 | Temp 97.7°F | Resp 16 | Ht 70.0 in | Wt 173.8 lb

## 2020-08-01 DIAGNOSIS — C8800 Waldenstrom macroglobulinemia not having achieved remission: Secondary | ICD-10-CM

## 2020-08-01 DIAGNOSIS — J449 Chronic obstructive pulmonary disease, unspecified: Secondary | ICD-10-CM

## 2020-08-01 DIAGNOSIS — J849 Interstitial pulmonary disease, unspecified: Secondary | ICD-10-CM

## 2020-08-01 DIAGNOSIS — G4730 Sleep apnea, unspecified: Secondary | ICD-10-CM | POA: Diagnosis not present

## 2020-08-01 DIAGNOSIS — K219 Gastro-esophageal reflux disease without esophagitis: Secondary | ICD-10-CM | POA: Diagnosis not present

## 2020-08-01 DIAGNOSIS — K922 Gastrointestinal hemorrhage, unspecified: Secondary | ICD-10-CM | POA: Diagnosis not present

## 2020-08-01 DIAGNOSIS — I2510 Atherosclerotic heart disease of native coronary artery without angina pectoris: Secondary | ICD-10-CM

## 2020-08-01 DIAGNOSIS — R0602 Shortness of breath: Secondary | ICD-10-CM

## 2020-08-01 DIAGNOSIS — I272 Pulmonary hypertension, unspecified: Secondary | ICD-10-CM

## 2020-08-01 DIAGNOSIS — I10 Essential (primary) hypertension: Secondary | ICD-10-CM

## 2020-08-01 DIAGNOSIS — E785 Hyperlipidemia, unspecified: Secondary | ICD-10-CM

## 2020-08-01 DIAGNOSIS — M7989 Other specified soft tissue disorders: Secondary | ICD-10-CM

## 2020-08-01 DIAGNOSIS — G4734 Idiopathic sleep related nonobstructive alveolar hypoventilation: Secondary | ICD-10-CM | POA: Diagnosis not present

## 2020-08-01 DIAGNOSIS — D509 Iron deficiency anemia, unspecified: Secondary | ICD-10-CM | POA: Diagnosis not present

## 2020-08-01 DIAGNOSIS — I4892 Unspecified atrial flutter: Secondary | ICD-10-CM

## 2020-08-01 DIAGNOSIS — R600 Localized edema: Secondary | ICD-10-CM

## 2020-08-01 DIAGNOSIS — E871 Hypo-osmolality and hyponatremia: Secondary | ICD-10-CM

## 2020-08-01 DIAGNOSIS — R609 Edema, unspecified: Secondary | ICD-10-CM

## 2020-08-01 LAB — PROTEIN ELECTROPHORESIS, SERUM
A/G Ratio: 0.8 (ref 0.7–1.7)
Albumin ELP: 3.4 g/dL (ref 2.9–4.4)
Alpha-1-Globulin: 0.4 g/dL (ref 0.0–0.4)
Alpha-2-Globulin: 0.6 g/dL (ref 0.4–1.0)
Beta Globulin: 1.4 g/dL — ABNORMAL HIGH (ref 0.7–1.3)
Gamma Globulin: 2.1 g/dL — ABNORMAL HIGH (ref 0.4–1.8)
Globulin, Total: 4.5 g/dL — ABNORMAL HIGH (ref 2.2–3.9)
M-Spike, %: 1.3 g/dL — ABNORMAL HIGH
Total Protein ELP: 7.9 g/dL (ref 6.0–8.5)

## 2020-08-01 LAB — CBC WITH DIFFERENTIAL/PLATELET
Basophils Absolute: 0 10*3/uL (ref 0.0–0.1)
Basophils Relative: 0.9 % (ref 0.0–3.0)
Eosinophils Absolute: 0 10*3/uL (ref 0.0–0.7)
Eosinophils Relative: 0.1 % (ref 0.0–5.0)
HCT: 23.6 % — CL (ref 39.0–52.0)
Hemoglobin: 7.9 g/dL — CL (ref 13.0–17.0)
Lymphocytes Relative: 16.2 % (ref 12.0–46.0)
Lymphs Abs: 0.9 10*3/uL (ref 0.7–4.0)
MCHC: 33.6 g/dL (ref 30.0–36.0)
MCV: 103.8 fl — ABNORMAL HIGH (ref 78.0–100.0)
Monocytes Absolute: 0.5 10*3/uL (ref 0.1–1.0)
Monocytes Relative: 10.1 % (ref 3.0–12.0)
Neutro Abs: 4 10*3/uL (ref 1.4–7.7)
Neutrophils Relative %: 72.7 % (ref 43.0–77.0)
Platelets: 429 10*3/uL — ABNORMAL HIGH (ref 150.0–400.0)
RBC: 2.28 Mil/uL — ABNORMAL LOW (ref 4.22–5.81)
RDW: 17.5 % — ABNORMAL HIGH (ref 11.5–15.5)
WBC: 5.4 10*3/uL (ref 4.0–10.5)

## 2020-08-01 LAB — BASIC METABOLIC PANEL
BUN: 18 mg/dL (ref 6–23)
CO2: 21 mEq/L (ref 19–32)
Calcium: 9.7 mg/dL (ref 8.4–10.5)
Chloride: 92 mEq/L — ABNORMAL LOW (ref 96–112)
Creatinine, Ser: 1.23 mg/dL (ref 0.40–1.50)
GFR: 56.28 mL/min — ABNORMAL LOW (ref >60.00)
Glucose, Bld: 79 mg/dL (ref 70–99)
Potassium: 4.7 mEq/L (ref 3.5–5.1)
Sodium: 125 mEq/L — ABNORMAL LOW (ref 135–145)

## 2020-08-01 LAB — HEPATIC FUNCTION PANEL
ALT: 9 U/L (ref 0–53)
AST: 16 U/L (ref 0–37)
Albumin: 3.7 g/dL (ref 3.5–5.2)
Alkaline Phosphatase: 80 U/L (ref 39–117)
Bilirubin, Direct: -0.6 mg/dL — ABNORMAL LOW (ref 0.0–0.3)
Total Bilirubin: 0.6 mg/dL (ref 0.2–1.2)
Total Protein: 8.3 g/dL (ref 6.0–8.3)

## 2020-08-01 LAB — TSH: TSH: 1.56 u[IU]/mL (ref 0.35–4.50)

## 2020-08-01 NOTE — Progress Notes (Signed)
Patient ID: Troy Lopez, male   DOB: [birth year], 79 y.o.   MRN: [id number]   Subjective:    Patient ID: Troy Lopez, male    DOB: 1941/01/09, 79 y.o.   MRN: [id number]  HPI This visit occurred during the SARS-CoV-2 public health emergency.  Safety protocols were in place, including screening questions prior to the visit, additional usage of staff PPE, and extensive cleaning of exam room while observing appropriate contact time as indicated for disinfecting solutions.  Patient here as a work in with concerns regarding weakness.  He is accompanied by his wife.  History obtained from both of them.  He was recently admitted for GI bleed.  See previous note.  Evaluated in hematology and received one unit pRBCs 07/26/20.  States he may have felt some better for a couple of day, but now reports increased fatigue.  Some sob.  Has been using his rescue inhaler - 2-10x/day.  Varies.  Has not been sleeping well.  Restless.  Has been sleeping in a recliner.  No chest pain.  Eating, but decreased appetite.  Back to drinking 6 beers per day.  No vomiting.  Some nausea at times.  Dark stool.  No BRB.  No black tarry stool.  Denies increased heart rate or palpitations.  Home health PT came out for an initial assessment, but wife states "waiting for approval".  Arm - healing.    Past Medical History:  Diagnosis Date  . Adrenal gland anomaly    enlargement  . Anemia    IDA  . Anxiety   . CAD (coronary artery disease)   . Carpal tunnel syndrome   . Chronic back pain   . Chronic hyponatremia   . Colitis   . Colonic polyp   . COPD (chronic obstructive pulmonary disease) ([place])   . Degenerative disc disease, lumbar   . Depression   . Diverticulosis   . Dyspnea   . Gastric AVM   . GERD (gastroesophageal reflux disease)   . H/O degenerative disc disease   . Hypercholesterolemia   . Hyperkalemia   . Hyperlipidemia   . Hypertension   . Irritable bowel syndrome   . Monoclonal gammopathy   . Monoclonal  gammopathy   . Neuropathy   . Personal history of tobacco use, presenting hazards to health 08/17/2015  . Sleep apnea   . Vertebral compression fracture [facility])    Past Surgical History:  Procedure Laterality Date  . 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/09/2020   Procedure: AMPUTATION TOE;  Surgeon: Sharlotte Alamo, DPM;  Location: [facility] ORS;  Service: Podiatry;  Laterality: Right;  . BUNIONECTOMY  1989  . CARDIAC CATHETERIZATION    . CARPAL TUNNEL RELEASE Left 2011   ulnar nerve sub muscular at elbow  . CHOLECYSTECTOMY  09/07  . COLONOSCOPY WITH PROPOFOL N/A 03/05/2017   Procedure: COLONOSCOPY WITH PROPOFOL;  Surgeon: Lucilla Lame, MD;  Location: [facility] ENDOSCOPY;  Service: Endoscopy;  Laterality: N/A;  . ELECTROPHYSIOLOGIC STUDY N/A 11/15/2015   Procedure: CARDIOVERSION;  Surgeon: Isaias Cowman, MD;  Location: [facility] ORS;  Service: Cardiovascular;  Laterality: N/A;  . ESOPHAGOGASTRODUODENOSCOPY N/A 04/10/2020   Procedure: ESOPHAGOGASTRODUODENOSCOPY (EGD);  Surgeon: Toledo, Benay Pike, MD;  Location: [facility] ENDOSCOPY;  Service: Gastroenterology;  Laterality: N/A;  . ESOPHAGOGASTRODUODENOSCOPY (EGD) WITH PROPOFOL N/A 03/05/2017   Procedure: ESOPHAGOGASTRODUODENOSCOPY (EGD) WITH PROPOFOL;  Surgeon: Lucilla Lame, MD;  Location: [facility] ENDOSCOPY;  Service: Endoscopy;  Laterality: N/A;  . ESOPHAGOGASTRODUODENOSCOPY (EGD) WITH PROPOFOL N/A 10/17/2017   Procedure: ESOPHAGOGASTRODUODENOSCOPY (EGD) WITH  PROPOFOL;  Surgeon: Lollie Sails, MD;  Location: [facility] ENDOSCOPY;  Service: Endoscopy;  Laterality: N/A;  . ESOPHAGOGASTRODUODENOSCOPY (EGD) WITH PROPOFOL N/A 07/11/2020   Procedure: ESOPHAGOGASTRODUODENOSCOPY (EGD) WITH PROPOFOL;  Surgeon: Lucilla Lame, MD;  Location: [facility] ENDOSCOPY;  Service: Endoscopy;  Laterality: N/A;  . EYE SURGERY Bilateral 2010   cataract  . HEMORRHOID SURGERY    . KYPHOPLASTY N/A 11/04/2016   Procedure: KYPHOPLASTY T 12;  Surgeon: Hessie Knows, MD;  Location: [facility] ORS;  Service: Orthopedics;   Laterality: N/A;   Family History  Problem Relation Age of Onset  . Stroke Mother   . Heart disease Father        MI - 68   . Colon cancer Neg Hx   . Prostate cancer Neg Hx    Social History   Socioeconomic History  . Marital status: Married    Spouse name: Not on file  . Number of children: 3  . Years of education: Not on file  . Highest education level: Not on file  Occupational History  . Not on file  Tobacco Use  . Smoking status: Former Smoker    Packs/day: 2.00    Years: 58.00    Pack years: 116.00    Types: Cigarettes    Start date: 08/19/1957    Quit date: 11/01/2015    Years since quitting: 4.7  . Smokeless tobacco: Never Used  Vaping Use  . Vaping Use: Never used  Substance and Sexual Activity  . Alcohol use: Yes    Alcohol/week: 42.0 standard drinks    Types: 42 Cans of beer per week    Comment: occas  . Drug use: No  . Sexual activity: Not Currently  Other Topics Concern  . Not on file  Social History Narrative  . Not on file   Social Determinants of Health   Financial Resource Strain: Medium Risk  . Difficulty of Paying Living Expenses: Somewhat hard  Food Insecurity: Not on file  Transportation Needs: Not on file  Physical Activity: Not on file  Stress: Stress Concern Present  . Feeling of Stress : Rather much  Social Connections: Not on file    Outpatient Encounter Medications as of 07/20/2020  Medication Sig  . acetaminophen (TYLENOL) 500 MG tablet Take 500-1,000 mg by mouth 3 (three) times daily as needed for moderate pain or headache.  . albuterol (VENTOLIN HFA) 108 (90 Base) MCG/ACT inhaler Inhale 2 puffs into the lungs every 6 (six) hours as needed for wheezing or shortness of breath.  Marland Kitchen azelastine (ASTELIN) 0.1 % nasal spray Place 1 spray into both nostrils 2 (two) times daily. Use in each nostril as directed (Patient not taking: Reported on 07/26/2020)  . budesonide (ENTOCORT EC) 3 MG 24 hr capsule Take 9 mg by mouth daily as needed  (colitis flare).  (Patient not taking: Reported on 07/26/2020)  . calcium-vitamin D (OSCAL WITH D) 500-200 MG-UNIT tablet Take 2 tablets by mouth daily.  Marland Kitchen ELIQUIS 5 MG TABS tablet Take 5 mg by mouth 2 (two) times daily.   Marland Kitchen FIBER PO Take 1 capsule by mouth daily.  (Patient not taking: Reported on 07/26/2020)  . fluticasone (FLONASE) 50 MCG/ACT nasal spray Place 1 spray into both nostrils daily. (Patient not taking: Reported on 07/26/2020)  . folic acid (FOLVITE) 1 MG tablet Take 1 tablet (1 mg total) by mouth daily. Can take any over-the-counter supplement.  . furosemide (LASIX) 20 MG tablet Take 20 mg by mouth 2 (two) times daily. (Patient  not taking: Reported on 07/26/2020)  . gabapentin (NEURONTIN) 100 MG capsule Take 4 capsules (400 mg) daily in the morning and midday. Take 8 capsules (800 mg) daily in the evening.  . metoprolol tartrate (LOPRESSOR) 100 MG tablet Take 1 tablet (100 mg total) by mouth 2 (two) times daily.  . ondansetron (ZOFRAN ODT) 4 MG disintegrating tablet Take 1 tablet (4 mg total) by mouth every 8 (eight) hours as needed for nausea or vomiting.  . pantoprazole (PROTONIX) 40 MG tablet Take 1 tablet (40 mg total) by mouth 2 (two) times daily.  . sertraline (ZOLOFT) 50 MG tablet Take 1 tablet (50 mg total) by mouth daily.  Marland Kitchen triamcinolone ointment (KENALOG) 0.1 %   . vitamin B-12 (CYANOCOBALAMIN) 1000 MCG tablet Take 1,000 mcg by mouth daily.  . [DISCONTINUED] Fluticasone-Umeclidin-Vilant (TRELEGY ELLIPTA) 100-62.5-25 MCG/INH AEPB Inhale 1 puff into the lungs daily.   No facility-administered encounter medications on file as of 08/07/2020.    Review of Systems  Constitutional: Positive for fatigue.       Eating.  Decreased appetite.    HENT: Positive for congestion. Negative for sinus pressure.   Respiratory: Negative for chest tightness.        Some sob.  Cough - stable.   Cardiovascular: Positive for leg swelling. Negative for chest pain and palpitations.   Gastrointestinal: Negative for abdominal pain, diarrhea and vomiting.  Genitourinary: Negative for difficulty urinating and dysuria.  Musculoskeletal: Negative for joint swelling and myalgias.  Skin: Negative for color change and rash.  Neurological: Negative for dizziness, light-headedness and headaches.  Psychiatric/Behavioral: Negative for agitation and dysphoric mood.       Objective:    Physical Exam Vitals reviewed.  Constitutional:      General: He is not in acute distress.    Appearance: He is well-developed and well-nourished.     Comments: Appears to not feel well.   HENT:     Head: Normocephalic and atraumatic.     Right Ear: External ear normal.     Left Ear: External ear normal.  Eyes:     General: No scleral icterus.       Right eye: No discharge.        Left eye: No discharge.     Conjunctiva/sclera: Conjunctivae normal.  Cardiovascular:     Rate and Rhythm: Normal rate.     Comments: Irregular rhythm.  Ventricular rate 90s.  Pulmonary:     Effort: Pulmonary effort is normal. No respiratory distress.     Breath sounds: Normal breath sounds.  Abdominal:     General: Bowel sounds are normal.     Palpations: Abdomen is soft.     Tenderness: There is no abdominal tenderness.  Genitourinary:    Comments: Rectal - heme positive.  Musculoskeletal:        General: No edema.     Cervical back: Neck supple. No tenderness.     Comments: Increase lower extremity edema - bilateral lower extremities.   Lymphadenopathy:     Cervical: No cervical adenopathy.  Skin:    Findings: No erythema or rash.  Neurological:     Mental Status: He is alert.  Psychiatric:        Mood and Affect: Mood and affect and mood normal.        Behavior: Behavior normal.     BP (!) 158/62   Pulse 89   Temp 97.7 F (36.5 C) (Oral)   Resp 16   Wt 173 lb 12.8  oz (78.8 kg)   SpO2 98%   BMI 24.94 kg/m  Wt Readings from Last 3 Encounters:  07/25/2020 173 lb 12.8 oz (78.8 kg)   07/26/20 177 lb 1 oz (80.3 kg)  07/20/20 170 lb 6.4 oz (77.3 kg)     Lab Results  Component Value Date   WBC 5.4 08/15/2020   HGB 7.9 Repeated and verified X2. (LL) 07/23/2020   HCT 23.6 Repeated and verified X2. (LL) 07/28/2020   PLT 429.0 (H) 08/03/2020   GLUCOSE 79 08/18/2020   CHOL 145 01/24/2020   TRIG 56.0 01/24/2020   HDL 68.30 01/24/2020   LDLCALC 65 01/24/2020   ALT 9 07/30/2020   AST 16 07/23/2020   NA 125 (L) 08/06/2020   K 4.7 08/17/2020   CL 92 (L) 08/07/2020   CREATININE 1.23 07/31/2020   BUN 18 08/07/2020   CO2 21 08/15/2020   TSH 1.56 08/11/2020   PSA 1.48 01/24/2020   INR 1.2 07/11/2020   HGBA1C 4.6 01/24/2020   MICROALBUR 1.9 10/27/2017       Assessment & Plan:   Problem List Items Addressed This Visit    Iron deficiency anemia    Evaluated by hematology.  Recheck cbc and iron studies. May need another transfusion.       Relevant Orders   CBC with Differential/Platelet (Completed)   CAD (coronary artery disease)    No chest pain.  EKG - Afib.  No acute ischemic changes.  Discussed with cardiology.  Restart metoprolol and lasix as outlined.  F/u with cardiology in the next few weeks.        Hypertension    Off blood pressure medication.  Blood pressure elevated.  Restart metoprolol.  Also start lasix.  Will need to follow pressure.  Check metabolic panel.        Relevant Orders   Hepatic function panel (Completed)   TSH (Completed)   Hyponatremia    Discussed the need to stop increased alcohol intake.  Recheck sodium today.       Relevant Orders   Basic metabolic panel (Completed)   GERD (gastroesophageal reflux disease)    Continue protonix.  No upper symptoms reported.       Sleep apnea   Shortness of breath    Discussed his sob is likely multifactorial.  With anemia.  Check cbc and iron studies.  Needs to sleep with oxygen.  Lasix as directed.  Restart metoprolol for better heart rate control.  EKG - AFib with ventricular rate 90s.   F/u with cardiology as planned.  Discussed concern regarding his multiple issues and admission.  He preferred not to be admitted to hospital.  Discussed will start lasix and metoprolol with close f/u planned, but if any worsening of symptoms, he will need to be seen.  Also discussed palliative care.  He was in agreement.  Mr Whitesel and his wife were comfortable with his plan.        Moderate COPD (chronic obstructive pulmonary disease) ([facility])    Followed by pulmonary.  Per pulmonary - start Breztri.  Has ventolin rescue inhaler.  Wear oxygen.  Follow.       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        Pulmonary hypertension ([place])    Followed by pulmonary and cardiology.       Atrial flutter ([place]) - Primary   Relevant Orders   EKG 12-Lead (Completed)   Hyperlipemia    Low cholesterol diet and exercise.  Follow lipid panel.       ILD (interstitial lung disease) ([place])    Followed by pulmonary.       Swelling of lower extremity    Check metabolic panel.  Feel swelling is probably multifactorial.  Elevate legs.  Encourage increased po intake and decreased alcohol intake.  With iron deficient anemia, recheck cbc and iron studies.  Restart lasix and metoprolol.        Peripheral edema    Check labs as outlined.  Restart lasix.        Nocturnal hypoxia    Make sure wearing oxygen at night.  Follow.        GI bleed    Recently admitted with GI bleed.  Stool recently darker, but denies any black tarry stool. Heme positive on exam.  Will contact GI for f/u.  Also, contact Dr Grayland Ormond.  Recheck cbc.  May need repeat transfusion.           Einar Pheasant, MD

## 2020-08-01 NOTE — Patient Instructions (Signed)
Take metoprolol 100mg  - 1/2 tablet per day  Take lasix one per day for three days.

## 2020-08-01 NOTE — Telephone Encounter (Signed)
CRITICAL VALUE STICKER  CRITICAL VALUE: Hemoglobin 7.9, Hematocrit 23.6  RECEIVER (on-site recipient of call): Orell Hurtado  DATE & TIME NOTIFIED: 08/09/2020; 4:33 pm  MESSENGER (representative from lab): Santiago Glad  MD NOTIFIED: [place]: 4:35 pm  RESPONSE:

## 2020-08-01 NOTE — Telephone Encounter (Signed)
See result note.  

## 2020-08-01 NOTE — Telephone Encounter (Signed)
Scheduled for 12 today.

## 2020-08-01 NOTE — Patient Instructions (Signed)
Visit Information  Patient Care Plan: Medication Management    Problem Identified: COPD, AFib, MGUS, depression     Long-Range Goal: Disease Progression Prevention   This Visit's Progress: On track  Priority: High  Note:   Current Barriers:  . Unable to independently afford treatment regimen . Complex patient with multiple comorbidities  Pharmacist Clinical Goal(s):  Marland Kitchen Over the next 90 days, patient will verbalize ability to afford treatment regimen . Over the next 90 days, patient will contact provider office for questions/concerns as evidenced notation of same in electronic health record.  Interventions: . Inter-disciplinary care team collaboration (see longitudinal plan of care) . Comprehensive medication review performed; medication list updated in electronic medical record  Atrial Fibrillation: . Appropriately managed; current rate control: metoprolol tartrate 50 mg BID; anticoagulant treatment: Eliquis 5 mg BID . Recommended to continue current regimen at this time along with cardiology collaboration  Chronic Obstructive Pulmonary Disease: Marland Kitchen Uncontrolled; current treatment: Trelegy 100/25/2.5 daily (plan to switch to [facility] 160/9/48 2 puffs BID when Trelegy supply complete), albuterol HFA PRN, instructed to use Mucinex BID to help loosen phlegm. Follows w/ Geraldo Pitter, NP. He notes that he uses albuterol sometimes BID, sometimes up to 10 times daily. Variable benefit.  . Due to reapply for [facility] assistance from [place]. Completed form online today . Discussed that he has multiple reasons for SOB, so not surprising that albuterol does not always provide benefit.  . Consider switch to albuterol nebulizer. Discussed w/ PCP.   MGUS, Anemia: . Uncontrolled. Periodic iron infusions, recent blood transfusion.  Roney [place] w/ PCP today regarding treatment and expectations. Consider Palliative Care consult   Depression . Moderately well controlled; current treatment: sertraline  50 mg daily . Continue current regimen at this time  Chronic Pain: . Moderately well controlled; current treatment: gabapentin 400 mg QAM, 400 mg midday, 800 mg QPM; reports he sometimes doesn't need the AM gabapentin dose . Continue current regimen at this time  GERD: . Controlled; current regimen: pantoprazole 40 mg BID . Continue current regimen at this time   Patient Goals/Self-Care Activities . Over the next 90 days, patient will:  - take medications as prescribed collaborate with provider on medication access solutions  Follow Up Plan: Telephone follow up appointment with care management team member scheduled for: 10 weeks       The patient verbalized understanding of instructions, educational materials, and care plan provided today and declined offer to receive copy of patient instructions, educational materials, and care plan.    Plan: Telephone follow up appointment with care management team member scheduled for: ~ 10 weeks  Catie [name], PharmD, [place], [place] Clinical Pharmacist [employer] [facility] [employer] [phone]

## 2020-08-01 NOTE — Chronic Care Management (AMB) (Signed)
**Note Troy-Identified via Obfuscation** Chronic Care Management   Pharmacy Note  08/08/2020 Name: Troy Lopez MRN: [id number] DOB: [birth year]  Subjective:  Troy Lopez is Lopez 79 y.o. year old male who is Lopez primary care patient of Troy Pheasant, MD. The CCM team was consulted for assistance with chronic disease management and care coordination needs.    Engaged with patient face to face for follow up visit in response to provider referral for pharmacy case management and/or care coordination services.   Consent to Services:  Troy Lopez was given information about Chronic Care Management services, agreed to services, and gave verbal consent prior to initiation of services on 02/22/2019. Please see initial visit note for detailed documentation.   Objective:  Lab Results  Component Value Date   CREATININE 1.36 (H) 07/26/2020   CREATININE 1.13 07/11/2020   CREATININE 1.05 07/10/2020    Lab Results  Component Value Date   HGBA1C 4.6 01/24/2020       Component Value Date/Time   CHOL 145 01/24/2020 0830   TRIG 56.0 01/24/2020 0830   HDL 68.30 01/24/2020 0830   CHOLHDL 2 01/24/2020 0830   VLDL 11.2 01/24/2020 0830   LDLCALC 65 01/24/2020 0830     BP Readings from Last 3 Encounters:  08/14/2020 (!) 158/62  07/26/20 (!) 163/91  07/26/20 (!) 143/63    Assessment/Interventions: Review of patient past medical history, allergies, medications, health status, including review of consultants reports, laboratory and other test data, was performed as part of comprehensive evaluation and provision of chronic care management services.   SDOH (Social Determinants of Health) assessments and interventions performed:  SDOH Interventions   Flowsheet Row Most Recent Value  SDOH Interventions   Financial Strain Interventions Other (Comment)  [manufacturer assistance]       CCM Care Plan  Allergies  Allergen Reactions   Iodinated Diagnostic Agents Other (See Comments)    Contraindication secondary to  IGMgammopathy/Waldren's syndrome   Not to be given due to Waldenstrom's syndrome, told it could affect kidney function    Medications Reviewed Today    Reviewed by Earnestine Leys, RN (Registered Nurse) on 07/26/20 at [phone]  Med List Status: <None>  Medication Order Taking? Sig Documenting Provider Last Dose Status Informant  acetaminophen (TYLENOL) 500 MG tablet [id number] Yes Take 500-1,000 mg by mouth 3 (three) times daily as needed for moderate pain or headache. [provider] Taking Active Self           Med Note Troy Lopez, Troy Lopez E   Mon Feb 22, 2019  9:30 AM) 2 tablets 3 times daily   albuterol (VENTOLIN HFA) 108 (90 Base) MCG/ACT inhaler [id number] Yes Inhale 2 puffs into the lungs every 6 (six) hours as needed for wheezing or shortness of breath. Brand Males, MD Taking Active   azelastine (ASTELIN) 0.1 % nasal spray [id number] No Place 1 spray into both nostrils 2 (two) times daily. Use in each nostril as directed  Patient not taking: Reported on 07/26/2020   Troy Pheasant, MD Not Taking Active   budesonide (ENTOCORT EC) 3 MG 24 hr capsule [id number] No Take 9 mg by mouth daily as needed (colitis flare).   Patient not taking: Reported on 07/26/2020   [provider] Not Taking Active Self           Med Note Troy Lopez   Wed Oct 30, 2016  4:02 PM)    calcium-vitamin D (OSCAL WITH D) 500-200 MG-UNIT tablet [id number] Yes Take 2 tablets by mouth daily. [provider] Taking Active   ELIQUIS 5 MG TABS tablet [id number] Yes Take 5 mg by mouth 2 (two) times daily.  [provider] Taking Active Self           Med Note Troy Lopez May 03, 2020  2:42 PM)    FIBER PO [id number] No Take 1 capsule by mouth daily.   Patient not taking: Reported on 07/26/2020   [provider] Not Taking Active Self  fluticasone (FLONASE) 50 MCG/ACT nasal spray [id number] No Place 1 spray into both nostrils daily.  Patient not taking: Reported on  07/26/2020   [name], NP Not Taking Active   Fluticasone-Umeclidin-[facility] ELLIPTA) 100-62.5-25 MCG/INH AEPB [id number] Yes Inhale 1 puff into the lungs daily. Brand Males, MD Taking Active   folic acid (FOLVITE) 1 MG tablet [id number] Yes Take 1 tablet (1 mg total) by mouth daily. Can take any over-the-counter supplement. Troy Bi, MD Taking Active   furosemide (LASIX) 20 MG tablet [id number] No Take 20 mg by mouth 2 (two) times daily.  Patient not taking: Reported on 07/26/2020   [provider] Not Taking Active            Med Note Troy Lopez   Wed May 03, 2020  2:44 PM) Taking 40 mg QAM  gabapentin (NEURONTIN) 100 MG capsule [id number] Yes Take 4 capsules (400 mg) daily in the morning and midday. Take 8 capsules (800 mg) daily in the evening. Troy Pheasant, MD Taking Active   metoprolol tartrate (LOPRESSOR) 100 MG tablet [id number] No Take 1 tablet (100 mg total) by mouth 2 (two) times daily.  Patient not taking: Reported on 07/26/2020   Troy Pheasant, MD Not Taking Active            Med Note Troy Lopez   Tue Jun 20, 2020 11:39 AM) Taking 50 mg BID  ondansetron (ZOFRAN ODT) 4 MG disintegrating tablet [id number] Yes Take 1 tablet (4 mg total) by mouth every 8 (eight) hours as needed for nausea or vomiting. Troy Polio, MD Taking Active   pantoprazole (PROTONIX) 40 MG tablet [id number] Yes Take 1 tablet (40 mg total) by mouth 2 (two) times daily. Troy Pheasant, MD Taking Active   sertraline (ZOLOFT) 50 MG tablet [id number] Yes Take 1 tablet (50 mg total) by mouth daily. Troy Pheasant, MD Taking Active   triamcinolone ointment (KENALOG) 0.1 % [id number] Yes  [provider] Taking Active   vitamin B-12 (CYANOCOBALAMIN) 1000 MCG tablet [id number] Yes Take 1,000 mcg by mouth daily. [provider] Taking Active   Med List Note Troy Patience, RN 04/15/18 1448): 04/09/2018 Request to stop Eliquis 4 days prior to procedure faxed  to Dr. Saralyn Pilar Manuela Schwartz) 04/15/18 approval received to stop Eliquis 3 days prior to procedure; pt notified to take last dose on 04/25/18 in prep for 04/29/18 procedure.          Patient Active Problem List   Diagnosis Date Noted   GI bleed 07/11/2020   History of atrial flutter 07/11/2020   Hypoalbuminemia 07/11/2020   Orthostatic hypotension 07/11/2020   Vitamin B12 deficiency 07/11/2020   GI bleeding 07/11/2020   Melena    Angiodysplasia of stomach    Acute blood loss anemia 07/10/2020   Multiple skin tears 06/30/2020   Laceration of skin of forearm, left, initial encounter 06/30/2020   Nocturnal hypoxia 06/27/2020   Cellulitis of right foot 04/09/2020   Renal lesion 03/12/2020   Post-nasal drip  11/24/2019   Skin nodule 09/28/2019   Scalp lesion 06/20/2019   Swelling of lower extremity 03/15/2019   Senile osteoporosis 02/24/2019   Neuropathy 01/01/2019   Macrocytic anemia 12/22/2018   Other fatigue 12/22/2018   ILD (interstitial lung disease) ([place]) 10/06/2018   Peripheral edema 06/10/2018   Lumbar radiculopathy (Right L1/2) 05/21/2018   Lumbar degenerative disc disease 05/21/2018   Lumbar foraminal stenosis 05/21/2018   Chronic pain syndrome 05/21/2018   Syncope 03/05/2018   Constipation 10/29/2017   Fall 10/29/2017   Rib pain on right side 10/29/2017   Pulmonary hypertension ([place]) 05/02/2017   Atrial flutter ([place]) 05/02/2017   Thoracic aortic aneurysm ([place]) 04/29/2017   Abnormal feces    Rectal polyp    Chest pain 03/03/2017   Blood in stool    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   Alcohol dependence ([place]) 01/30/2017   History of compression fracture of spine 12/08/2016   Moderate COPD (chronic obstructive pulmonary disease) ([place]) 07/22/2016   COPD exacerbation ([place]) 07/22/2016   Mild depression ([place]) 07/21/2016   Skin lesion 01/21/2016   Nasal congestion 09/28/2015   Sleep difficulties 09/26/2015    Personal history of tobacco use, presenting hazards to health 08/17/2015   Health care maintenance 10/23/2014   Collagenous colitis 09/16/2014   Leg pain 03/20/2014   Hyperlipemia 12/22/2013   Shortness of breath 11/21/2013   Diarrhea 05/11/2013   Headache 05/11/2013   Ulcer 02/02/2013   Chronic back pain 02/02/2013   GERD (gastroesophageal reflux disease) 09/13/2012   Sleep apnea 09/13/2012   Iron deficiency anemia 09/08/2012   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/2012   CAD (coronary artery disease) 09/08/2012   Hypertension 09/08/2012   Hyponatremia 09/08/2012    Conditions to be addressed/monitored per PCP order: Atrial Fibrillation, HTN and COPD  Patient Care Plan: Medication Management    Problem Identified: COPD, AFib, MGUS, depression     Long-Range Goal: Disease Progression Prevention   This Visit's Progress: On track  Priority: High  Note:   Current Barriers:   Unable to independently afford treatment regimen  Complex patient with multiple comorbidities  Pharmacist Clinical Goal(s):   Over the next 90 days, patient will verbalize ability to afford treatment regimen  Over the next 90 days, patient will contact provider office for questions/concerns as evidenced notation of same in electronic health record.  Interventions:  Inter-disciplinary care team collaboration (see longitudinal plan of care)  Comprehensive medication review performed; medication list updated in electronic medical record  Atrial Fibrillation:  Appropriately managed; current rate control: metoprolol tartrate 50 mg BID; anticoagulant treatment: Eliquis 5 mg BID  Recommended to continue current regimen at this time along with cardiology collaboration  Chronic Obstructive Pulmonary Disease:  Uncontrolled; current treatment: Trelegy 100/25/2.5 daily (plan to switch to [facility] 160/9/48 2 puffs BID when Trelegy supply complete), albuterol HFA PRN,  instructed to use Mucinex BID to help loosen phlegm. Follows w/ Geraldo Pitter, NP. He notes that he uses albuterol sometimes BID, sometimes up to 10 times daily. Variable benefit.   Due to reapply for [facility] assistance from [place]. Completed form online today  Discussed that he has multiple reasons for SOB, so not surprising that albuterol does not always provide benefit.   Consider switch to albuterol nebulizer. Discussed w/ PCP.   MGUS, Anemia:  Uncontrolled. Periodic iron infusions, recent blood transfusion.   Collaborate w/ PCP today regarding treatment and expectations. Consider Palliative Care consult   Depression  Moderately well controlled; current treatment: sertraline 50 mg  daily  Continue current regimen at this time  Chronic Pain:  Moderately well controlled; current treatment: gabapentin 400 mg QAM, 400 mg midday, 800 mg QPM; reports he sometimes doesn't need the AM gabapentin dose  Continue current regimen at this time  GERD:  Controlled; current regimen: pantoprazole 40 mg BID  Continue current regimen at this time   Patient Goals/Self-Care Activities  Over the next 90 days, patient will:  - take medications as prescribed collaborate with provider on medication access solutions  Follow Up Plan: Telephone follow up appointment with care management team member scheduled for: 10 weeks      Medication Assistance: Application for [employer]) medication assistance program in process. Anticipated assistance start date TBD. See plan of care below for additional detail.   Plan: Telephone follow up appointment with care management team member scheduled for: ~ 10 weeks  Catie Troy Lopez, PharmD, [place], [place] Clinical Pharmacist [employer] [facility] [employer] [phone]

## 2020-08-02 ENCOUNTER — Other Ambulatory Visit: Payer: Self-pay | Admitting: Internal Medicine

## 2020-08-02 ENCOUNTER — Telehealth: Payer: Self-pay | Admitting: Internal Medicine

## 2020-08-02 ENCOUNTER — Telehealth: Payer: Self-pay

## 2020-08-02 NOTE — Progress Notes (Signed)
Contact GI regarding appt. Already established pt of Dr Alice Reichert.  No need for new referral.

## 2020-08-02 NOTE — Assessment & Plan Note (Signed)
Recently admitted with GI bleed.  Stool recently darker, but denies any black tarry stool. Heme positive on exam.  Will contact GI for f/u.  Also, contact Dr Grayland Ormond.  Recheck cbc.  May need repeat transfusion.

## 2020-08-02 NOTE — Telephone Encounter (Signed)
Troy Lopez (pt wife) called to report pt's death. She states that he passed away last night.

## 2020-08-02 NOTE — Telephone Encounter (Signed)
See result note for documentation

## 2020-08-02 NOTE — Telephone Encounter (Signed)
Called Ms Overbey

## 2020-08-02 NOTE — Telephone Encounter (Signed)
Patient's death certificate that needs to be signed, is up front in Dr.Scott's color folder.

## 2020-08-03 ENCOUNTER — Ambulatory Visit: Payer: PPO | Admitting: Internal Medicine

## 2020-08-03 NOTE — Telephone Encounter (Signed)
Signed and placed in box.  Need time of death.

## 2020-08-03 NOTE — Telephone Encounter (Signed)
Death certificate placed in quick sign for signature.

## 2020-08-03 NOTE — Telephone Encounter (Signed)
Time of death not added. [employer]eral home is going to call [place] [employer] to determine time of death. [place] [facility] [place] will come pick up death certificate

## 2020-08-03 NOTE — Telephone Encounter (Signed)
Placed up front

## 2020-08-06 ENCOUNTER — Encounter: Payer: Self-pay | Admitting: Internal Medicine

## 2020-08-06 NOTE — Assessment & Plan Note (Signed)
No chest pain.  EKG - Afib.  No acute ischemic changes.  Discussed with cardiology.  Restart metoprolol and lasix as outlined.  F/u with cardiology in the next few weeks.

## 2020-08-06 NOTE — Assessment & Plan Note (Signed)
Continue protonix.  No upper symptoms reported.  

## 2020-08-06 NOTE — Assessment & Plan Note (Signed)
Discussed his sob is likely multifactorial.  With anemia.  Check cbc and iron studies.  Needs to sleep with oxygen.  Lasix as directed.  Restart metoprolol for better heart rate control.  EKG - AFib with ventricular rate 90s.  F/u with cardiology as planned.  Discussed concern regarding his multiple issues and admission.  He preferred not to be admitted to hospital.  Discussed will start lasix and metoprolol with close f/u planned, but if any worsening of symptoms, he will need to be seen.  Also discussed palliative care.  He was in agreement.  Troy Lopez and his wife were comfortable with his plan.

## 2020-08-06 NOTE — Assessment & Plan Note (Signed)
Followed by pulmonary and cardiology.  

## 2020-08-06 NOTE — Assessment & Plan Note (Signed)
Off blood pressure medication.  Blood pressure elevated.  Restart metoprolol.  Also start lasix.  Will need to follow pressure.  Check metabolic panel.

## 2020-08-06 NOTE — Assessment & Plan Note (Signed)
Followed by pulmonary 

## 2020-08-06 NOTE — Assessment & Plan Note (Signed)
Evaluated by hematology.  Recheck cbc and iron studies. May need another transfusion.

## 2020-08-06 NOTE — Assessment & Plan Note (Signed)
Check metabolic panel.  Feel swelling is probably multifactorial.  Elevate legs.  Encourage increased po intake and decreased alcohol intake.  With iron deficient anemia, recheck cbc and iron studies.  Restart lasix and metoprolol.

## 2020-08-06 NOTE — Assessment & Plan Note (Signed)
Check labs as outlined.  Restart lasix.

## 2020-08-06 NOTE — Assessment & Plan Note (Signed)
Low cholesterol diet and exercise.  Follow lipid panel.   

## 2020-08-06 NOTE — Assessment & Plan Note (Signed)
Followed by pulmonary.  Per pulmonary - start Breztri.  Has ventolin rescue inhaler.  Wear oxygen.  Follow.

## 2020-08-06 NOTE — Assessment & Plan Note (Signed)
Discussed the need to stop increased alcohol intake.  Recheck sodium today.

## 2020-08-06 NOTE — Assessment & Plan Note (Signed)
Make sure wearing oxygen at night.  Follow.

## 2020-08-06 NOTE — Assessment & Plan Note (Signed)
Followed by hematology 

## 2020-08-07 ENCOUNTER — Other Ambulatory Visit: Payer: PPO

## 2020-08-07 ENCOUNTER — Inpatient Hospital Stay: Payer: PPO

## 2020-08-07 ENCOUNTER — Inpatient Hospital Stay: Payer: PPO | Admitting: Oncology

## 2020-08-07 ENCOUNTER — Ambulatory Visit: Payer: PPO

## 2020-08-07 ENCOUNTER — Ambulatory Visit: Payer: PPO | Admitting: Oncology

## 2020-08-19 DEATH — deceased

## 2020-09-19 ENCOUNTER — Ambulatory Visit: Payer: PPO | Admitting: Internal Medicine

## 2021-12-18 IMAGING — CR DG CHEST 2V
2 series · 2 of 2 positions shown · non-contrast
Comparison: 09/29/2019

CLINICAL DATA: Interstitial lung disease

EXAM:
CHEST - 2 VIEW

[chest pa]
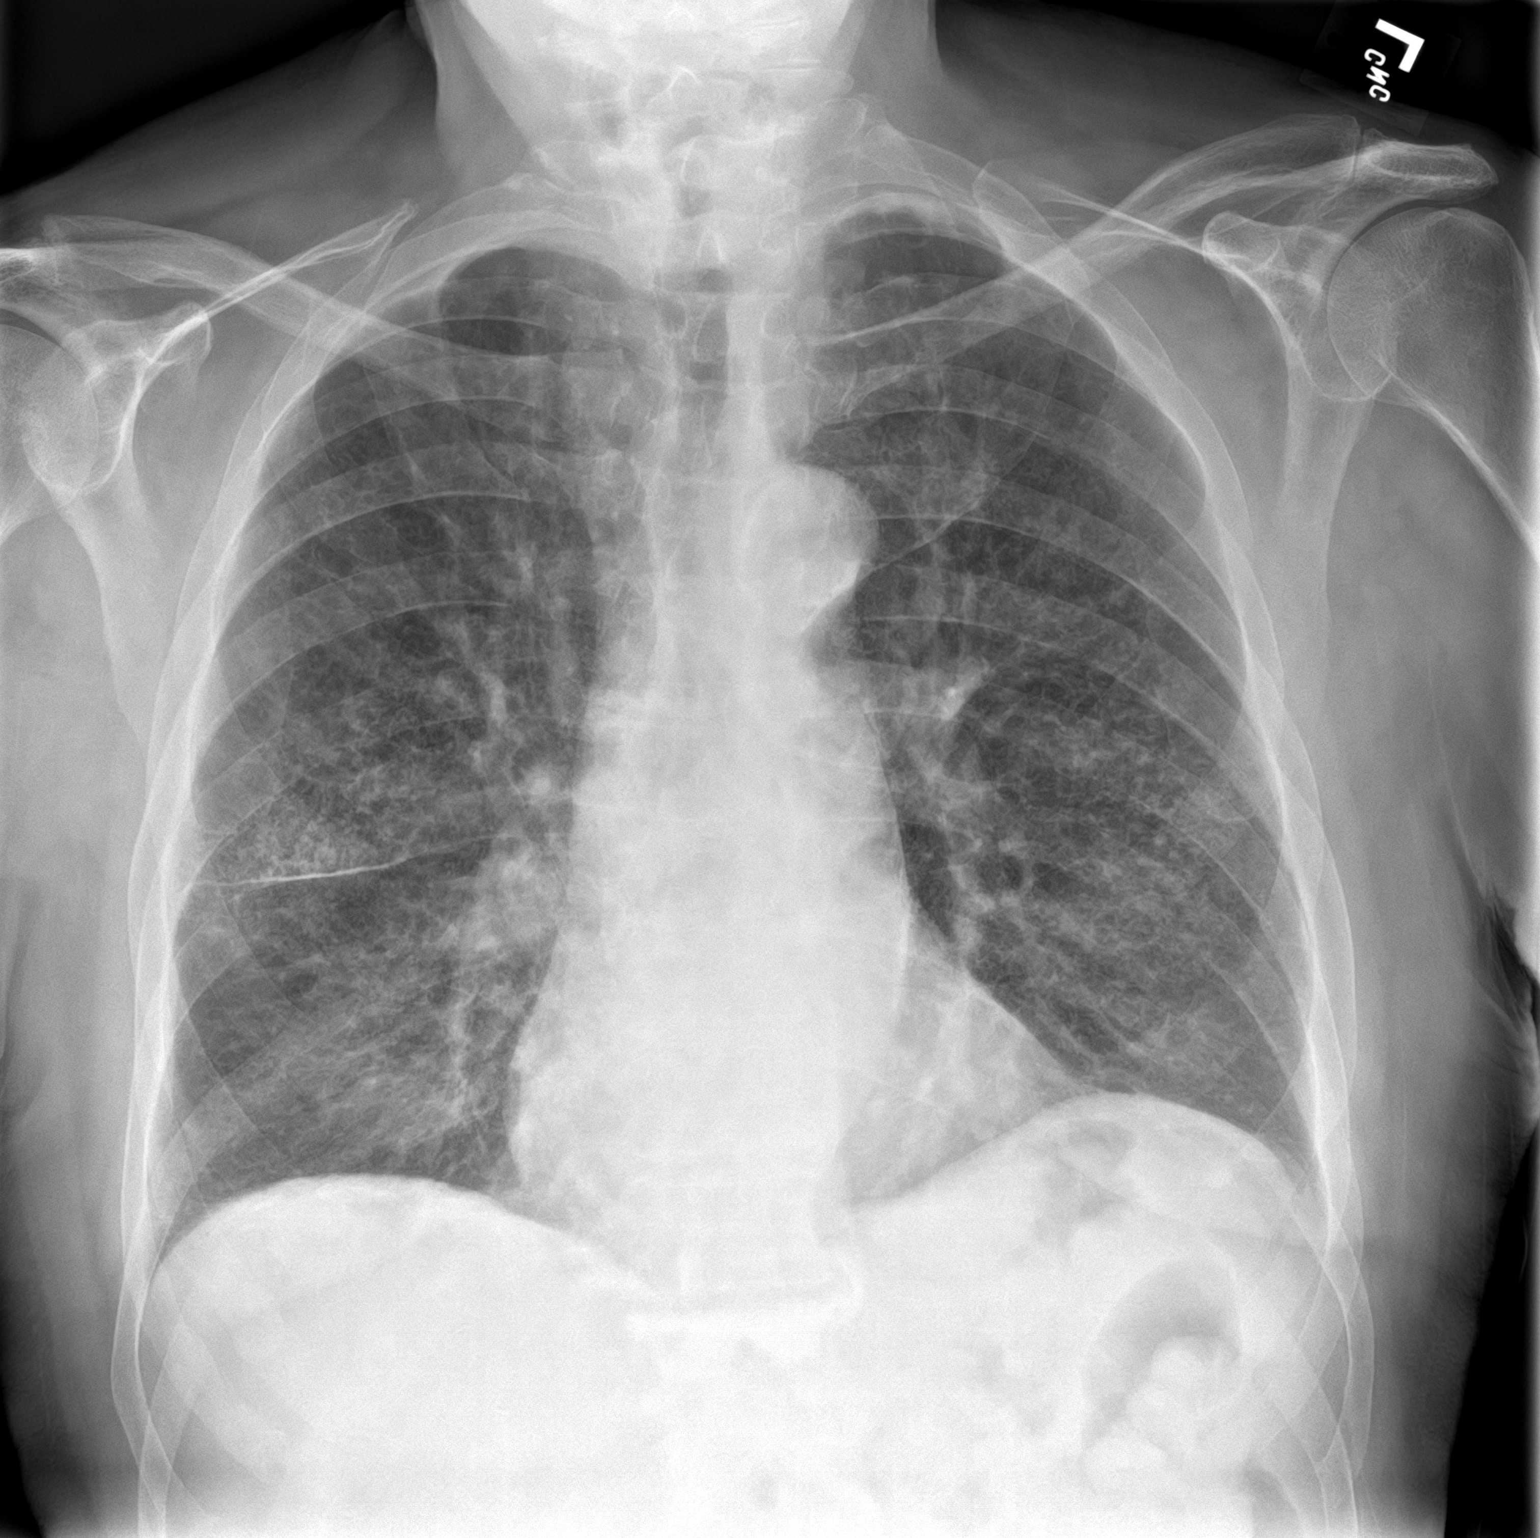

[chest lat]
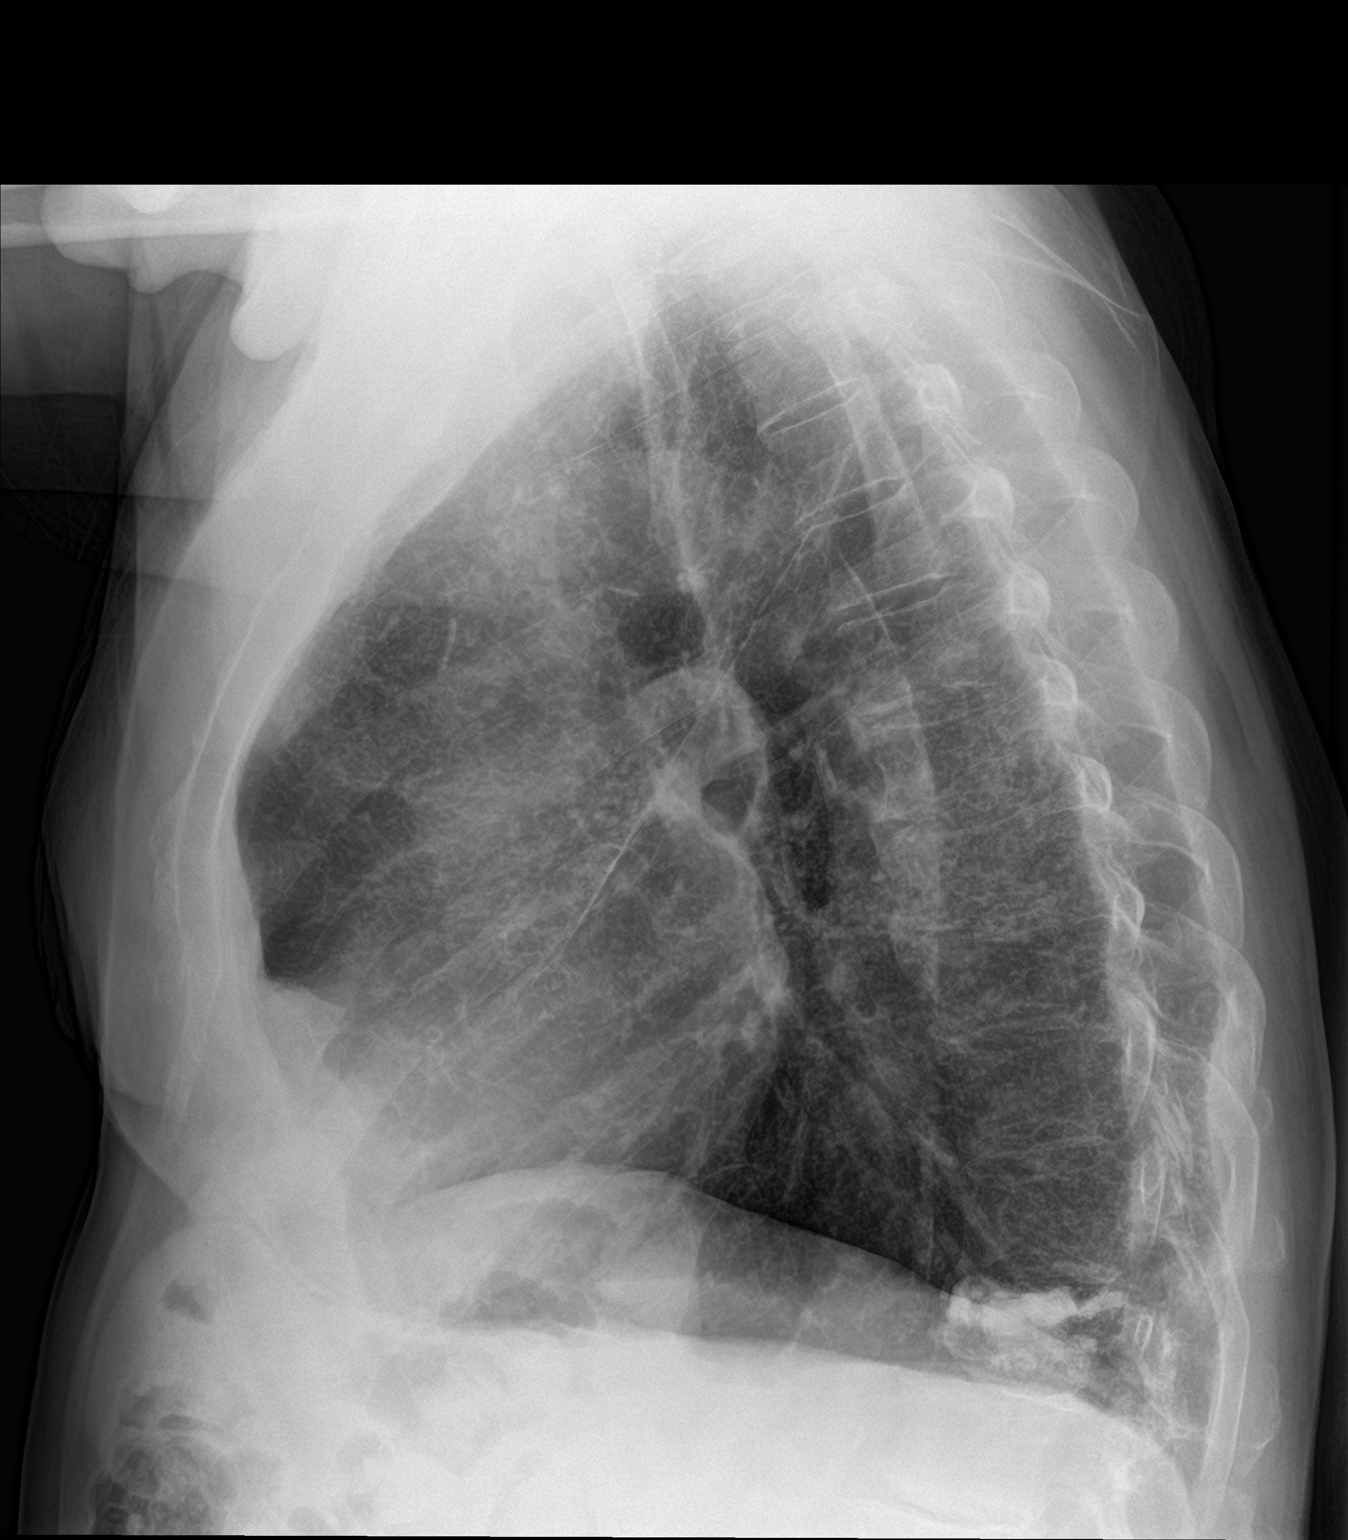

[2 of 2 positions shown; findings below may reference images not displayed]

FINDINGS: Multifocal interstitial opacities, unchanged. No superimposed acute
consolidation. No pleural effusion or pneumothorax. Normal
cardiomediastinal contours. Remote vertebral augmentation.
IMPRESSION: Multifocal chronic interstitial opacities without acute airspace
disease.
